# Patient Record
Sex: Female | Born: 1935 | Race: Black or African American | Hispanic: No | Smoking: Never smoker
Health system: Southern US, Community
[De-identification: ages and names within clinical notes are randomized; demographics above are authoritative.]

## PROBLEM LIST (undated history)

## (undated) DIAGNOSIS — K219 Gastro-esophageal reflux disease without esophagitis: Secondary | ICD-10-CM

## (undated) DIAGNOSIS — IMO0002 Reserved for concepts with insufficient information to code with codable children: Secondary | ICD-10-CM

## (undated) DIAGNOSIS — I350 Nonrheumatic aortic (valve) stenosis: Secondary | ICD-10-CM

## (undated) DIAGNOSIS — L03039 Cellulitis of unspecified toe: Secondary | ICD-10-CM

## (undated) DIAGNOSIS — G629 Polyneuropathy, unspecified: Secondary | ICD-10-CM

## (undated) DIAGNOSIS — I480 Paroxysmal atrial fibrillation: Secondary | ICD-10-CM

## (undated) DIAGNOSIS — I471 Supraventricular tachycardia: Secondary | ICD-10-CM

## (undated) DIAGNOSIS — M1610 Unilateral primary osteoarthritis, unspecified hip: Secondary | ICD-10-CM

## (undated) DIAGNOSIS — I119 Hypertensive heart disease without heart failure: Secondary | ICD-10-CM

## (undated) DIAGNOSIS — I4719 Other supraventricular tachycardia: Secondary | ICD-10-CM

## (undated) DIAGNOSIS — D649 Anemia, unspecified: Secondary | ICD-10-CM

## (undated) DIAGNOSIS — N393 Stress incontinence (female) (male): Secondary | ICD-10-CM

## (undated) DIAGNOSIS — I5032 Chronic diastolic (congestive) heart failure: Secondary | ICD-10-CM

## (undated) DIAGNOSIS — Z96659 Presence of unspecified artificial knee joint: Secondary | ICD-10-CM

## (undated) DIAGNOSIS — M76899 Other specified enthesopathies of unspecified lower limb, excluding foot: Secondary | ICD-10-CM

## (undated) DIAGNOSIS — E119 Type 2 diabetes mellitus without complications: Secondary | ICD-10-CM

## (undated) DIAGNOSIS — M169 Osteoarthritis of hip, unspecified: Secondary | ICD-10-CM

## (undated) DIAGNOSIS — I2510 Atherosclerotic heart disease of native coronary artery without angina pectoris: Secondary | ICD-10-CM

## (undated) DIAGNOSIS — I472 Ventricular tachycardia: Secondary | ICD-10-CM

## (undated) DIAGNOSIS — M4800 Spinal stenosis, site unspecified: Secondary | ICD-10-CM

## (undated) DIAGNOSIS — E785 Hyperlipidemia, unspecified: Secondary | ICD-10-CM

## (undated) DIAGNOSIS — I442 Atrioventricular block, complete: Secondary | ICD-10-CM

## (undated) DIAGNOSIS — R000 Tachycardia, unspecified: Secondary | ICD-10-CM

## (undated) DIAGNOSIS — J45909 Unspecified asthma, uncomplicated: Secondary | ICD-10-CM

## (undated) DIAGNOSIS — Z950 Presence of cardiac pacemaker: Secondary | ICD-10-CM

## (undated) DIAGNOSIS — M797 Fibromyalgia: Secondary | ICD-10-CM

## (undated) HISTORY — DX: Presence of unspecified artificial knee joint: Z96.659

## (undated) HISTORY — DX: Nonrheumatic aortic (valve) stenosis: I35.0

## (undated) HISTORY — DX: Atherosclerotic heart disease of native coronary artery without angina pectoris: I25.10

## (undated) HISTORY — DX: Osteoarthritis of hip, unspecified: M16.9

## (undated) HISTORY — DX: Hypertensive heart disease without heart failure: I11.9

## (undated) HISTORY — DX: Ventricular tachycardia: I47.2

## (undated) HISTORY — DX: Anemia, unspecified: D64.9

## (undated) HISTORY — DX: Hyperlipidemia, unspecified: E78.5

## (undated) HISTORY — DX: Reserved for concepts with insufficient information to code with codable children: IMO0002

## (undated) HISTORY — DX: Cellulitis of unspecified toe: L03.039

## (undated) HISTORY — DX: Atrioventricular block, complete: I44.2

## (undated) HISTORY — DX: Fibromyalgia: M79.7

## (undated) HISTORY — DX: Unspecified asthma, uncomplicated: J45.909

## (undated) HISTORY — DX: Polyneuropathy, unspecified: G62.9

## (undated) HISTORY — PX: CARDIAC CATHETERIZATION: SHX172

## (undated) HISTORY — DX: Stress incontinence (female) (male): N39.3

## (undated) HISTORY — DX: Type 2 diabetes mellitus without complications: E11.9

## (undated) HISTORY — DX: Gastro-esophageal reflux disease without esophagitis: K21.9

## (undated) HISTORY — DX: Paroxysmal atrial fibrillation: I48.0

## (undated) HISTORY — DX: Morbid (severe) obesity due to excess calories: E66.01

## (undated) HISTORY — PX: EYE SURGERY: SHX253

## (undated) HISTORY — DX: Presence of cardiac pacemaker: Z95.0

## (undated) HISTORY — DX: Chronic diastolic (congestive) heart failure: I50.32

## (undated) HISTORY — DX: Other specified enthesopathies of unspecified lower limb, excluding foot: M76.899

## (undated) HISTORY — DX: Tachycardia, unspecified: R00.0

## (undated) HISTORY — DX: Unilateral primary osteoarthritis, unspecified hip: M16.10

---

## 2004-10-26 ENCOUNTER — Ambulatory Visit: Payer: Self-pay | Admitting: Internal Medicine

## 2004-12-29 ENCOUNTER — Ambulatory Visit: Payer: Self-pay | Admitting: Gastroenterology

## 2005-04-10 ENCOUNTER — Ambulatory Visit: Payer: Self-pay | Admitting: Cardiology

## 2005-06-05 ENCOUNTER — Other Ambulatory Visit: Payer: Self-pay

## 2005-06-15 ENCOUNTER — Inpatient Hospital Stay: Payer: Self-pay | Admitting: General Practice

## 2005-06-17 ENCOUNTER — Other Ambulatory Visit: Payer: Self-pay

## 2005-06-21 ENCOUNTER — Encounter: Payer: Self-pay | Admitting: Internal Medicine

## 2005-10-27 ENCOUNTER — Ambulatory Visit: Payer: Self-pay | Admitting: Internal Medicine

## 2006-06-05 ENCOUNTER — Ambulatory Visit: Payer: Self-pay | Admitting: Cardiology

## 2006-07-18 ENCOUNTER — Ambulatory Visit: Payer: Self-pay | Admitting: Gastroenterology

## 2006-10-30 ENCOUNTER — Inpatient Hospital Stay: Payer: Self-pay | Admitting: General Practice

## 2006-11-02 ENCOUNTER — Encounter: Payer: Self-pay | Admitting: Internal Medicine

## 2006-11-03 HISTORY — PX: REPLACEMENT TOTAL KNEE: SUR1224

## 2006-11-05 ENCOUNTER — Emergency Department: Payer: Self-pay

## 2006-11-05 ENCOUNTER — Other Ambulatory Visit: Payer: Self-pay

## 2006-11-05 ENCOUNTER — Encounter: Payer: Self-pay | Admitting: Internal Medicine

## 2006-11-06 ENCOUNTER — Emergency Department: Payer: Self-pay | Admitting: Emergency Medicine

## 2006-11-06 ENCOUNTER — Other Ambulatory Visit: Payer: Self-pay

## 2007-02-20 ENCOUNTER — Ambulatory Visit: Payer: Self-pay | Admitting: Internal Medicine

## 2007-07-05 ENCOUNTER — Emergency Department: Payer: Self-pay | Admitting: Emergency Medicine

## 2007-07-09 ENCOUNTER — Ambulatory Visit: Payer: Self-pay | Admitting: Gastroenterology

## 2007-09-12 ENCOUNTER — Ambulatory Visit: Payer: Self-pay | Admitting: Ophthalmology

## 2007-09-24 ENCOUNTER — Ambulatory Visit: Payer: Self-pay | Admitting: Ophthalmology

## 2007-11-04 ENCOUNTER — Ambulatory Visit: Payer: Self-pay | Admitting: Ophthalmology

## 2007-11-19 ENCOUNTER — Ambulatory Visit: Payer: Self-pay | Admitting: Ophthalmology

## 2007-12-05 HISTORY — PX: NASAL SINUS SURGERY: SHX719

## 2008-03-03 ENCOUNTER — Ambulatory Visit: Payer: Self-pay | Admitting: Cardiology

## 2008-03-10 ENCOUNTER — Ambulatory Visit: Payer: Self-pay | Admitting: Internal Medicine

## 2008-05-01 ENCOUNTER — Emergency Department: Payer: Self-pay | Admitting: Emergency Medicine

## 2008-05-04 ENCOUNTER — Ambulatory Visit: Payer: Self-pay

## 2008-08-12 ENCOUNTER — Emergency Department: Payer: Self-pay | Admitting: Emergency Medicine

## 2008-09-08 ENCOUNTER — Ambulatory Visit: Payer: Self-pay | Admitting: Otolaryngology

## 2008-09-09 ENCOUNTER — Ambulatory Visit: Payer: Self-pay | Admitting: Unknown Physician Specialty

## 2008-11-09 ENCOUNTER — Ambulatory Visit: Payer: Self-pay | Admitting: Otolaryngology

## 2008-11-11 ENCOUNTER — Ambulatory Visit: Payer: Self-pay | Admitting: Otolaryngology

## 2009-01-30 ENCOUNTER — Emergency Department: Payer: Self-pay | Admitting: Emergency Medicine

## 2009-03-15 ENCOUNTER — Ambulatory Visit: Payer: Self-pay | Admitting: Internal Medicine

## 2009-08-04 HISTORY — PX: CARDIAC CATHETERIZATION: SHX172

## 2009-08-10 ENCOUNTER — Ambulatory Visit: Payer: Self-pay | Admitting: Cardiology

## 2009-08-17 ENCOUNTER — Ambulatory Visit: Payer: Self-pay | Admitting: Unknown Physician Specialty

## 2010-03-21 ENCOUNTER — Ambulatory Visit: Payer: Medicare Other | Admitting: Internal Medicine

## 2010-06-03 ENCOUNTER — Ambulatory Visit: Payer: Medicare Other

## 2010-08-17 ENCOUNTER — Ambulatory Visit: Payer: Medicare Other | Admitting: General Practice

## 2010-08-25 ENCOUNTER — Ambulatory Visit: Payer: Medicare Other | Admitting: General Practice

## 2010-08-30 ENCOUNTER — Encounter: Admission: RE | Admit: 2010-08-30 | Discharge: 2010-08-30 | Payer: Self-pay | Admitting: Orthopedic Surgery

## 2010-08-30 ENCOUNTER — Ambulatory Visit: Payer: Self-pay | Admitting: Cardiovascular Disease

## 2010-09-03 HISTORY — PX: TOTAL HIP ARTHROPLASTY: SHX124

## 2010-09-05 ENCOUNTER — Ambulatory Visit: Payer: Self-pay | Admitting: Family Medicine

## 2010-09-12 ENCOUNTER — Inpatient Hospital Stay: Payer: Medicare Other | Admitting: General Practice

## 2010-09-17 ENCOUNTER — Encounter: Payer: Medicare Other | Admitting: Internal Medicine

## 2010-10-04 ENCOUNTER — Encounter: Payer: Medicare Other | Admitting: Internal Medicine

## 2010-11-01 ENCOUNTER — Ambulatory Visit: Payer: Medicare Other

## 2010-12-08 ENCOUNTER — Encounter
Admission: RE | Admit: 2010-12-08 | Discharge: 2010-12-08 | Payer: Self-pay | Source: Home / Self Care | Attending: Orthopedic Surgery | Admitting: Orthopedic Surgery

## 2010-12-22 ENCOUNTER — Encounter
Admission: RE | Admit: 2010-12-22 | Discharge: 2010-12-22 | Payer: Self-pay | Source: Home / Self Care | Attending: Orthopedic Surgery | Admitting: Orthopedic Surgery

## 2011-01-03 NOTE — Assessment & Plan Note (Signed)
Summary: FLU SHOT/EVM   Assessment New Problems: NEED PROPHYLACTIC VACCINATION&INOCULATION FLU (ICD-V04.81)   The patient and/or caregiver has been counseled thoroughly with regard to medications prescribed including dosage, schedule, interactions, rationale for use, and possible side effects and they verbalize understanding.  Diagnoses and expected course of recovery discussed and will return if not improved as expected or if the condition worsens. Patient and/or caregiver verbalized understanding.   Medication Administration  Injection # 1:    Medication: Inflenza    Diagnosis: V01.84    Route: IM    Site: L deltoid    Exp Date: 05/04/2011    Lot #: [id number]    Mfr: [email or web address]    Comments: Assesed pt. for allergies and previous vaccine history. Pt. denies any food or drug allergies. Pt. states she has received the influenza vaccine before without any adverse reactions.  VIS given to pt.  Administered vaccine without any complicatons, pt. tolerated it well.    Patient tolerated injection without complications    Given by: Ashok Norris LPN (September 05, 2010 4:37 PM)  Orders Added: 1)  INFLUENZA VIRUS VACCINE SPLIT VIRUS 3 YEARS + I [[id number]]

## 2011-03-22 ENCOUNTER — Ambulatory Visit (INDEPENDENT_AMBULATORY_CARE_PROVIDER_SITE_OTHER): Payer: Medicare Other | Admitting: Cardiovascular Disease

## 2011-03-22 ENCOUNTER — Encounter: Payer: Self-pay | Admitting: Cardiovascular Disease

## 2011-03-22 DIAGNOSIS — I359 Nonrheumatic aortic valve disorder, unspecified: Secondary | ICD-10-CM

## 2011-03-22 DIAGNOSIS — R5383 Other fatigue: Secondary | ICD-10-CM

## 2011-03-22 DIAGNOSIS — I350 Nonrheumatic aortic (valve) stenosis: Secondary | ICD-10-CM

## 2011-03-22 DIAGNOSIS — R5381 Other malaise: Secondary | ICD-10-CM

## 2011-03-22 DIAGNOSIS — E785 Hyperlipidemia, unspecified: Secondary | ICD-10-CM

## 2011-03-22 DIAGNOSIS — I2510 Atherosclerotic heart disease of native coronary artery without angina pectoris: Secondary | ICD-10-CM

## 2011-03-22 DIAGNOSIS — M4800 Spinal stenosis, site unspecified: Secondary | ICD-10-CM

## 2011-03-22 NOTE — Assessment & Plan Note (Signed)
Symptoms of fatigue are somewhat vague. I'm uncertain if she has underlying sleep disorder, if her fatigue is from being generally deconditioned. We have suggested she continue physical therapy and in fact would like her to increase this to 3 times a week if possible with some kind of exercise on the other days of the week.

## 2011-03-22 NOTE — Assessment & Plan Note (Signed)
Most recent labs show total cholesterol 147, LDL 90, HDL 36. Labs were from November 2012. Ideally we should him for LDL less than 70. We are asked her to watch her diet, try to lose weight if possible.

## 2011-03-22 NOTE — Patient Instructions (Signed)
Please continue physical therapy three times a week. Please call if you get worsening shortness of breath or chest pain. This could be coming from your aortic valve. We will call you for a follow up appt in 6 months. Please call earlier for a APPT if you have worsening symptoms. Please monitor your blood pressure. Call if your blood pressure is too low.

## 2011-03-22 NOTE — Assessment & Plan Note (Addendum)
She does have at least moderate aortic valve disease. Last echocardiogram from outside of this office suggested an aortic valve area of 0.9 cm sq, Mean gradient of 43 mm of mercury.   She denies any significant shortness of breath or chest pain. Her main complaint is fatigue though this is somewhat nonspecific and it is uncertain if this is from her underlying cardiac issues. Her fatigue also seems to be there mildly at rest and with minimal exertion though she is able to use the bike and perform at rehabilitation without significant problems.  I suggested to her that we monitor her for now. The first would not be a cardiac catheterization but rather a TEE to evaluate her aortic valve. It is probably unlikely that her mild to moderate coronary artery disease has progressed to something severe in little over a year with such excellent cholesterol control.   Certainly if her symptoms get worse With worsening shortness of breath, chest tightness, worsening fatigue, we could have her complete a TEE and cardiac catheterization.

## 2011-03-22 NOTE — Assessment & Plan Note (Signed)
She reports that all surgery has been delayed on her back because of her heart condition. Her aortic valve is significant though can be certainly managed medically he went through a back operation. If her back surgery is indicated, we'll be happy to play a role in monitoring her through this surgical procedure.

## 2011-03-22 NOTE — Progress Notes (Signed)
   Patient ID: Michele Schultz, female    DOB: 12-23-1930, 77 y.o.   MRN: [id number]  HPI Comments: Michele Schultz Is a very pleasant 77 year old woman, patient of Dr. Daniel Nones, with a history of hypertension, diabetes, hyperlipidemia, coronary artery disease with 50% mid left main and 50% ostial circumflex disease also with at least moderate aortic valve stenosis, chronic fatigue who presents to establish care.  She reports that her main complaint is fatigue. She completes physical therapy at [facility] 2 times per week and strongly believes that this is helping her. She reports having spinal stenosis which causes some lower extremity discomfort and weakness. She is uncertain why she has fatigue. Her fatigue has been worse over the past 6 months or so. She sleeps well typically does wake up several times for her back pain as she cannot get comfortable. She denies any significant shortness of breath with exertion while walking or with physical therapy. She denies any significant chest pain with exertion.  She is concerned that her blood pressure could be too low and that this could be contributing to her fatigue.  She was upset with her previous cardiologist but the details are uncertain. He had recommended a cardiac catheterization to determine if she needed bypass surgery or valve surgery or both.   EKG shows normal sinus rhythm with rate 76 beats per minute with T-wave abnormality in leads V3 through V6, inferior leads     Review of Systems  Constitutional: Positive for fatigue.  HENT: Negative.   Eyes: Negative.   Respiratory: Negative.   Cardiovascular: Negative.   Gastrointestinal: Negative.   Musculoskeletal: Positive for back pain, arthralgias and gait problem.  Skin: Negative.   Neurological: Negative.   Hematological: Negative.   Psychiatric/Behavioral: Negative.   All other systems reviewed and are negative.   BP 108/60  Pulse 74  Ht 5\' 4"  (1.626 m)  Wt 227 lb (102.967 kg)  BMI  38.96 kg/m2    Physical Exam  Nursing note and vitals reviewed. Constitutional: She is oriented to person, place, and time. She appears well-developed and well-nourished.       Obese  HENT:  Head: Normocephalic.  Nose: Nose normal.  Mouth/Throat: Oropharynx is clear and moist.  Eyes: Conjunctivae are normal. Pupils are equal, round, and reactive to light.  Neck: Normal range of motion. Neck supple. No JVD present.  Cardiovascular: Normal rate, regular rhythm, S1 normal, S2 normal and intact distal pulses.  Exam reveals no gallop and no friction rub.   Murmur heard.  Crescendo systolic murmur is present with a grade of 3/6  Pulmonary/Chest: Effort normal and breath sounds normal. No respiratory distress. She has no wheezes. She has no rales. She exhibits no tenderness.  Abdominal: Soft. Bowel sounds are normal. She exhibits no distension. There is no tenderness.  Musculoskeletal: Normal range of motion. She exhibits no edema and no tenderness.  Lymphadenopathy:    She has no cervical adenopathy.  Neurological: She is alert and oriented to person, place, and time. Coordination normal.  Skin: Skin is warm and dry. No rash noted. No erythema.  Psychiatric: She has a normal mood and affect. Her behavior is normal. Judgment and thought content normal.         Assessment and Plan

## 2011-03-22 NOTE — Assessment & Plan Note (Signed)
Mild-to-moderate left main and ostial circumflex disease in late 2010. We will just watch her for now as her symptoms of fatigue are somewhat nonspecific, and appeared to be relatively stable. I suggested she contact me if her symptoms get worse

## 2011-04-03 ENCOUNTER — Encounter: Payer: Self-pay | Admitting: Cardiovascular Disease

## 2011-04-04 ENCOUNTER — Encounter: Payer: Self-pay | Admitting: Cardiovascular Disease

## 2011-04-04 ENCOUNTER — Ambulatory Visit: Payer: Medicare Other | Admitting: Internal Medicine

## 2011-08-23 ENCOUNTER — Ambulatory Visit: Payer: Medicare Other | Admitting: Unknown Physician Specialty

## 2011-08-29 ENCOUNTER — Encounter: Payer: Self-pay | Admitting: Cardiovascular Disease

## 2011-08-29 ENCOUNTER — Ambulatory Visit (INDEPENDENT_AMBULATORY_CARE_PROVIDER_SITE_OTHER): Payer: Medicare Other | Admitting: Cardiovascular Disease

## 2011-08-29 DIAGNOSIS — I359 Nonrheumatic aortic valve disorder, unspecified: Secondary | ICD-10-CM

## 2011-08-29 DIAGNOSIS — E785 Hyperlipidemia, unspecified: Secondary | ICD-10-CM

## 2011-08-29 DIAGNOSIS — I2510 Atherosclerotic heart disease of native coronary artery without angina pectoris: Secondary | ICD-10-CM

## 2011-08-29 DIAGNOSIS — M4800 Spinal stenosis, site unspecified: Secondary | ICD-10-CM

## 2011-08-29 DIAGNOSIS — I350 Nonrheumatic aortic (valve) stenosis: Secondary | ICD-10-CM

## 2011-08-29 NOTE — Assessment & Plan Note (Signed)
Currently with no symptoms of angina. No further workup at this time. Continue current medication regimen. She does have occasional indigestion which is unchanged from previous visits.

## 2011-08-29 NOTE — Assessment & Plan Note (Signed)
I believe it has been sometime since her aortic valve was evaluated. Murmur is quite prominent. We have ordered an echocardiogram to evaluate aortic valve stenosis, previously was felt to be moderate in severity.

## 2011-08-29 NOTE — Assessment & Plan Note (Signed)
She does have chronic pain issues in her back, joints. No surgeries planned at this time

## 2011-08-29 NOTE — Progress Notes (Signed)
Patient ID: Michele Schultz, female    DOB: 1931-02-24, 77 y.o.   MRN: [id number]  HPI Comments: Michele Schultz Is a very pleasant 77 year old woman, patient of Dr. Daniel Nones, He was a former Engineer, civil (consulting), who lost her house and [place], with a history of hypertension, diabetes, hyperlipidemia, coronary artery disease with 50% mid left main and 50% ostial circumflex disease also with at least moderate aortic valve stenosis several years ago, chronic fatigue who presents for routine followup.  She reports that overall she is doing well. She does have chronic back pain, chronic knee pain and hip pain. She has had a knee and hip replacement. She does have occasional gas pains with discomfort in her chest. Sometimes she takes a soda to improve her symptoms. She believes that she has a hiatal hernia. Symptoms are brief, occur one to 2 times per week lasting for 2-3 minutes at a time. This is unchanged in frequency and intensity and not associated with exertion. She has had problems with fatigueShe is concerned that her blood pressure could be too low and that this could be contributing to her fatigue.  EKG shows normal sinus rhythm with rate 71 beats per minute with T-wave abnormality in leads V3 through V6, inferior leads   Outpatient Encounter Prescriptions as of 08/29/2011  Medication Sig Dispense Refill  . amLODipine (NORVASC) 5 MG tablet Take 5 mg by mouth daily.        Marland Kitchen aspirin 81 MG tablet Take 81 mg by mouth daily.        Marland Kitchen atorvastatin (LIPITOR) 40 MG tablet Take 40 mg by mouth daily.        . Calcium Carbonate-Vitamin D (CALCIUM 600+D) 600-200 MG-UNIT TABS Take 1 tablet by mouth 2 (two) times daily.        . Cholecalciferol (VITAMIN D) 2000 UNITS CAPS Take 1 capsule by mouth daily.        . Cyanocobalamin (VITAMIN B-12) 1000 MCG/15ML LIQD Inject 1,000 mcg as directed every 30 (thirty) days.        . cycloSPORINE (RESTASIS) 0.05 % ophthalmic emulsion Place 1 drop into both eyes 2 (two)  times daily.        . Ferrous Sulfate Dried (SLOW RELEASE IRON) 140 (45 FE) MG TBCR Take 1 tablet by mouth daily.        . fish oil-omega-3 fatty acids 1000 MG capsule Take 2 g by mouth 2 (two) times daily.        Marland Kitchen gabapentin (NEURONTIN) 100 MG tablet Take 100 mg by mouth daily.        Marland Kitchen HYDROcodone-acetaminophen (NORCO) 10-325 MG per tablet Take 1 tablet by mouth every 6 (six) hours as needed.        Marland Kitchen ibuprofen (ADVIL,MOTRIN) 800 MG tablet Take 800 mg by mouth every 8 (eight) hours as needed.        . metoprolol (TOPROL-XL) 50 MG 24 hr tablet Take 50 mg by mouth daily.        . Multiple Vitamin (MULTIVITAMIN) tablet Take 1 tablet by mouth daily.        Marland Kitchen oxybutynin (DITROPAN-XL) 10 MG 24 hr tablet Take 10 mg by mouth daily.        . pantoprazole (PROTONIX) 40 MG tablet Take 40 mg by mouth 2 (two) times daily.        . valsartan (DIOVAN) 160 MG tablet Take 160 mg by mouth daily.  Review of Systems  Constitutional: Positive for fatigue.  HENT: Negative.   Eyes: Negative.   Respiratory: Negative.   Cardiovascular: Negative.   Gastrointestinal: Negative.   Musculoskeletal: Positive for back pain, arthralgias and gait problem.  Skin: Negative.   Neurological: Negative.   Hematological: Negative.   Psychiatric/Behavioral: Negative.   All other systems reviewed and are negative.   BP 135/85  Pulse 76  Ht 5\' 4"  (1.626 m)  Wt 232 lb 12 oz (105.575 kg)  BMI 39.95 kg/m2  Physical Exam  Nursing note and vitals reviewed. Constitutional: She is oriented to person, place, and time. She appears well-developed and well-nourished.       Obese  HENT:  Head: Normocephalic.  Nose: Nose normal.  Mouth/Throat: Oropharynx is clear and moist.  Eyes: Conjunctivae are normal. Pupils are equal, round, and reactive to light.  Neck: Normal range of motion. Neck supple. No JVD present.  Cardiovascular: Normal rate, regular rhythm, S1 normal, S2 normal and intact distal pulses.  Exam reveals  no gallop and no friction rub.   Murmur heard.  Crescendo systolic murmur is present with a grade of 3/6  Pulmonary/Chest: Effort normal and breath sounds normal. No respiratory distress. She has no wheezes. She has no rales. She exhibits no tenderness.  Abdominal: Soft. Bowel sounds are normal. She exhibits no distension. There is no tenderness.  Musculoskeletal: Normal range of motion. She exhibits no edema and no tenderness.  Lymphadenopathy:    She has no cervical adenopathy.  Neurological: She is alert and oriented to person, place, and time. Coordination normal.  Skin: Skin is warm and dry. No rash noted. No erythema.  Psychiatric: She has a normal mood and affect. Her behavior is normal. Judgment and thought content normal.         Assessment and Plan

## 2011-08-29 NOTE — Assessment & Plan Note (Signed)
Given her underlying coronary artery disease, goal LDL is less than 70, total cholesterol less than 150. Her recent cholesterol shows total cholesterol 132, LDL 74. This is relatively adequate.

## 2011-08-29 NOTE — Patient Instructions (Addendum)
You are doing well. No medication changes were made. Please call us if you have new issues that need to be addressed before your next appt.  We will call you for a follow up Appt. In 6 months  Your physician has requested that you have an echocardiogram. Echocardiography is a painless test that uses sound waves to create images of your heart. It provides your doctor with information about the size and shape of your heart and how well your heart's chambers and valves are working. This procedure takes approximately one hour. There are no restrictions for this procedure.

## 2011-09-05 ENCOUNTER — Other Ambulatory Visit (INDEPENDENT_AMBULATORY_CARE_PROVIDER_SITE_OTHER): Payer: Medicare Other | Admitting: *Deleted

## 2011-09-05 DIAGNOSIS — I350 Nonrheumatic aortic (valve) stenosis: Secondary | ICD-10-CM

## 2011-09-05 DIAGNOSIS — I359 Nonrheumatic aortic valve disorder, unspecified: Secondary | ICD-10-CM

## 2011-09-15 ENCOUNTER — Ambulatory Visit (INDEPENDENT_AMBULATORY_CARE_PROVIDER_SITE_OTHER): Payer: Medicare Other | Admitting: Cardiovascular Disease

## 2011-09-15 ENCOUNTER — Encounter: Payer: Self-pay | Admitting: Cardiovascular Disease

## 2011-09-15 VITALS — BP 173/92 | HR 72 | Ht 64.0 in | Wt 232.0 lb

## 2011-09-15 DIAGNOSIS — I2510 Atherosclerotic heart disease of native coronary artery without angina pectoris: Secondary | ICD-10-CM

## 2011-09-15 DIAGNOSIS — I10 Essential (primary) hypertension: Secondary | ICD-10-CM

## 2011-09-15 DIAGNOSIS — I359 Nonrheumatic aortic valve disorder, unspecified: Secondary | ICD-10-CM

## 2011-09-15 DIAGNOSIS — I110 Hypertensive heart disease with heart failure: Secondary | ICD-10-CM | POA: Insufficient documentation

## 2011-09-15 DIAGNOSIS — I350 Nonrheumatic aortic (valve) stenosis: Secondary | ICD-10-CM

## 2011-09-15 DIAGNOSIS — M4800 Spinal stenosis, site unspecified: Secondary | ICD-10-CM

## 2011-09-15 DIAGNOSIS — E785 Hyperlipidemia, unspecified: Secondary | ICD-10-CM

## 2011-09-15 MED ORDER — AMLODIPINE BESYLATE 5 MG PO TABS: 5.0000 mg | ORAL_TABLET | Freq: Every day | ORAL | Status: DC

## 2011-09-15 NOTE — Assessment & Plan Note (Signed)
Blood pressure is elevated. She has had a headache for several days. We have suggested she increase her amlodipine back to 5 mg daily and closely monitor her pressures at home.

## 2011-09-15 NOTE — Assessment & Plan Note (Signed)
We have suggested she stay on her cholesterol pill. Goal LDL less than 70.

## 2011-09-15 NOTE — Progress Notes (Signed)
Patient ID: Michele Schultz, female    DOB: September 15, 1931, 77 y.o.   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   She has chronic back pain, chronic knee pain and hip pain. She has had a knee and hip replacement. She continues to have discomfort in her chest. Sometimes she takes a soda to improve her symptoms. . Symptoms are brief, occur one to 2 times per week lasting for 2-3 minutes at a time. The frequency and intensity has been increasing and now associated with  exertion. She has had problems with fatigue.   Echo shows: - Left ventricle: The cavity size was normal. There was mild concentric hypertrophy. Systolic function was normal. The   estimated ejection fraction was in the range of 55% to 60%. Wall motion was normal; there were no regional wall   motion abnormalities. Doppler parameters are consistent with abnormal left ventricular relaxation (grade 1 diastolic dysfunction). - Aortic valve: Moderately to severely calcified annulus. Trileaflet; severely calcified leaflets. Cusp separation was severely reduced. Transvalvular velocity was increased. There was severe stenosis. Mild regurgitation. Mean gradient: 57mm Hg (S). Peak gradient: 78mm Hg (S). Valve area: 0.72cm^2 (Vmax). - Mitral valve: Mild regurgitation.    - Right ventricle: Systolic function was normal.  EKG shows normal sinus rhythm with rate 72 beats per minute with T-wave abnormality in leads V3 through V6, and inferior leads   Outpatient Encounter Prescriptions as of 09/15/2011  Medication Sig Dispense Refill  . amLODipine (NORVASC) 5 MG tablet Take 1/2  tablet (5 mg total) by mouth daily.  30 tablet  6  . aspirin 81 MG tablet Take 81 mg by mouth daily.        Marland Kitchen atorvastatin (LIPITOR) 40 MG tablet Take 40 mg by mouth daily.        . Calcium Carbonate-Vitamin D (CALCIUM 600+D) 600-200 MG-UNIT TABS Take 1 tablet by mouth 2 (two) times daily.        . Cholecalciferol (VITAMIN D) 2000 UNITS CAPS Take 1 capsule by mouth daily.        . Cyanocobalamin (VITAMIN B-12) 1000 MCG/15ML LIQD Inject 1,000 mcg as directed every 30 (thirty) days.        . cycloSPORINE (RESTASIS) 0.05 % ophthalmic emulsion Place 1 drop into both eyes 2 (two) times daily.        . Ferrous Sulfate Dried (SLOW RELEASE IRON) 140 (45 FE) MG TBCR Take 1 tablet by mouth daily.        . fish oil-omega-3 fatty acids 1000 MG capsule Take 2 g by mouth 2 (two) times daily.        Marland Kitchen HYDROcodone-acetaminophen (NORCO) 10-325 MG per tablet Take 1 tablet by mouth every 6 (six) hours as needed.        Marland Kitchen ibuprofen (ADVIL,MOTRIN) 800 MG tablet Take 800 mg by mouth every 8 (eight) hours as needed.        . metoprolol (TOPROL-XL) 50 MG 24 hr tablet Take 50 mg by mouth daily.        . Multiple Vitamin (MULTIVITAMIN) tablet Take 1 tablet by mouth daily.        Marland Kitchen oxybutynin (DITROPAN-XL)  10 MG 24 hr tablet Take 10 mg by mouth daily.        . pantoprazole (PROTONIX) 40 MG tablet Take 40 mg by mouth 2 (two) times daily.        . valsartan (DIOVAN) 160 MG tablet Take 160 mg by mouth daily.          Review of Systems  Constitutional: Positive for fatigue.  HENT: Negative.   Eyes: Negative.   Respiratory: Positive for shortness of breath.   Cardiovascular: Positive for chest pain.  Gastrointestinal: Negative.   Musculoskeletal: Positive for back pain, arthralgias and gait problem.  Skin: Negative.   Neurological: Negative.   Hematological: Negative.   Psychiatric/Behavioral: Negative.   All other systems reviewed and are negative.   BP 173/92  Pulse 72  Ht 5\' 4"  (1.626 m)  Wt 232 lb (105.235  kg)  BMI 39.82 kg/m2  Physical Exam  Nursing note and vitals reviewed. Constitutional: She is oriented to person, place, and time. She appears well-developed and well-nourished.       Obese  HENT:  Head: Normocephalic.  Nose: Nose normal.  Mouth/Throat: Oropharynx is clear and moist.  Eyes: Conjunctivae are normal. Pupils are equal, round, and reactive to light.  Neck: Normal range of motion. Neck supple. No JVD present.  Cardiovascular: Normal rate, regular rhythm, S1 normal, S2 normal and intact distal pulses.  Exam reveals no gallop and no friction rub.   Murmur heard.  Crescendo systolic murmur is present with a grade of 3/6  Pulmonary/Chest: Effort normal and breath sounds normal. No respiratory distress. She has no wheezes. She has no rales. She exhibits no tenderness.  Abdominal: Soft. Bowel sounds are normal. She exhibits no distension. There is no tenderness.  Musculoskeletal: Normal range of motion. She exhibits no edema and no tenderness.  Lymphadenopathy:    She has no cervical adenopathy.  Neurological: She is alert and oriented to person, place, and time. Coordination normal.  Skin: Skin is warm and dry. No rash noted. No erythema.  Psychiatric: She has a normal mood and affect. Her behavior is normal. Judgment and thought content normal.         Assessment and Plan

## 2011-09-15 NOTE — Assessment & Plan Note (Addendum)
Recent echocardiogram shows severe aortic valve stenosis with valve area 0.7 cm. She is having symptoms of chest pain and shortness of breath with exertion. We will schedule her for a transesophageal echo, also schedule her for a cardiac catheterization After the echocardiogram. She does have a history of left main disease. She'll likely need an aortic valve replacement in the near future. Uncertain if a new valve will completely resolve all of her symptoms of shortness of breath and she does have underlying obesity and is deconditioned.

## 2011-09-15 NOTE — Assessment & Plan Note (Signed)
She does have chronic pain from history of spinal stenosis. She takes Percocet p.r.n..

## 2011-09-15 NOTE — Patient Instructions (Addendum)
You are doing well. Please increase the amlodipine to 5 mg a day. Please monitor your blood pressure at home and call if the numbers are high Please call us if you have new issues that need to be addressed before your next appt.  We will call you for a follow up Appt. In 1 month.

## 2011-09-15 NOTE — Assessment & Plan Note (Signed)
Cardiac catheterization 2 years ago suggested 50% left main disease and ostial circumflex disease. As part of her workup for her aortic valve and possible surgery, we will schedule her for a cardiac catheterization.

## 2011-09-18 ENCOUNTER — Telehealth: Payer: Self-pay | Admitting: Cardiovascular Disease

## 2011-09-18 NOTE — Telephone Encounter (Signed)
Called pt back LMOM TCB. TEE has been scheduled for Friday 10/19 at [facility]. Need to give pt instructions.

## 2011-09-18 NOTE — Telephone Encounter (Signed)
PATIENT CALLING TO SET UP HER TEE AT [facility], SHE WOULD LIKE TO SPEAK WITH RN BEFORE 12:00PM TODAY AS LATER TODAY SHE WILL BE TIED UP WITH OTHER APPOINTMENTS.

## 2011-09-18 NOTE — Telephone Encounter (Signed)
Spoke to pt, notified of appt time, instructions given for procedure and she confirmed that she will have a driver post procedure. Pt advised to call back with any questions regarding procedure.

## 2011-09-22 ENCOUNTER — Ambulatory Visit: Payer: Medicare Other | Admitting: Cardiovascular Disease

## 2011-09-22 DIAGNOSIS — I359 Nonrheumatic aortic valve disorder, unspecified: Secondary | ICD-10-CM

## 2011-09-25 ENCOUNTER — Other Ambulatory Visit: Payer: Self-pay | Admitting: Cardiovascular Disease

## 2011-09-25 DIAGNOSIS — I350 Nonrheumatic aortic (valve) stenosis: Secondary | ICD-10-CM

## 2011-09-25 DIAGNOSIS — Z01810 Encounter for preprocedural cardiovascular examination: Secondary | ICD-10-CM

## 2011-09-27 ENCOUNTER — Encounter: Payer: Self-pay | Admitting: *Deleted

## 2011-09-29 ENCOUNTER — Ambulatory Visit (INDEPENDENT_AMBULATORY_CARE_PROVIDER_SITE_OTHER): Payer: Medicare Other | Admitting: *Deleted

## 2011-09-29 DIAGNOSIS — Z01810 Encounter for preprocedural cardiovascular examination: Secondary | ICD-10-CM

## 2011-09-29 DIAGNOSIS — I359 Nonrheumatic aortic valve disorder, unspecified: Secondary | ICD-10-CM

## 2011-09-29 DIAGNOSIS — I350 Nonrheumatic aortic (valve) stenosis: Secondary | ICD-10-CM

## 2011-09-30 LAB — CBC WITH DIFFERENTIAL
Basophils Absolute: 0 10*3/uL (ref 0.0–0.2)
Eosinophils Absolute: 0.2 10*3/uL (ref 0.0–0.4)
Immature Granulocytes: 0 % (ref 0–2)
Lymphocytes Absolute: 2.5 10*3/uL (ref 0.7–4.5)
Lymphs: 30 % (ref 14–46)
MCHC: 33.4 g/dL (ref 31.5–35.7)
Monocytes: 7 % (ref 4–13)
Platelets: 294 10*3/uL (ref 140–415)
RDW: 14.2 % (ref 12.3–15.4)
WBC: 8.3 10*3/uL (ref 4.0–10.5)

## 2011-09-30 LAB — BASIC METABOLIC PANEL
BUN/Creatinine Ratio: 26 (ref 11–26)
Calcium: 10 mg/dL (ref 8.6–10.2)
Creatinine, Ser: 0.68 mg/dL (ref 0.57–1.00)
GFR calc non Af Amer: 84 mL/min/{1.73_m2} (ref >59)
Potassium: 3.9 mmol/L (ref 3.5–5.2)
Sodium: 142 mmol/L (ref 134–144)

## 2011-10-03 ENCOUNTER — Encounter: Payer: Self-pay | Admitting: Cardiovascular Disease

## 2011-10-03 ENCOUNTER — Ambulatory Visit: Payer: Medicare Other | Admitting: Cardiovascular Disease

## 2011-10-03 DIAGNOSIS — I2510 Atherosclerotic heart disease of native coronary artery without angina pectoris: Secondary | ICD-10-CM

## 2011-10-04 ENCOUNTER — Other Ambulatory Visit: Payer: Self-pay | Admitting: Cardiovascular Disease

## 2011-10-04 DIAGNOSIS — I350 Nonrheumatic aortic (valve) stenosis: Secondary | ICD-10-CM

## 2011-10-04 DIAGNOSIS — Z01810 Encounter for preprocedural cardiovascular examination: Secondary | ICD-10-CM

## 2011-10-12 ENCOUNTER — Encounter: Payer: Self-pay | Admitting: Thoracic Surgery (Cardiothoracic Vascular Surgery)

## 2011-10-12 ENCOUNTER — Institutional Professional Consult (permissible substitution) (INDEPENDENT_AMBULATORY_CARE_PROVIDER_SITE_OTHER): Payer: Medicare Other | Admitting: Thoracic Surgery (Cardiothoracic Vascular Surgery)

## 2011-10-12 ENCOUNTER — Other Ambulatory Visit: Payer: Self-pay

## 2011-10-12 VITALS — BP 160/83 | HR 76 | Resp 20 | Ht 64.0 in | Wt 230.0 lb

## 2011-10-12 DIAGNOSIS — I359 Nonrheumatic aortic valve disorder, unspecified: Secondary | ICD-10-CM

## 2011-10-12 DIAGNOSIS — I2510 Atherosclerotic heart disease of native coronary artery without angina pectoris: Secondary | ICD-10-CM

## 2011-10-12 NOTE — Progress Notes (Signed)
PCP is Daniel Nones, [place] Referring Provider is Mariah Milling, Tollie Pizza, MD  Chief Complaint  Patient presents with  . 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 Julien Nordmann regarding severe aortic stenosis. She recently has been having chest pain which she describes as sharp pains often related to food and she thinks it could potentially be indigestion. She also harvest been having shortness of breath with exertion. She states with walking with a rapid pace or walking up an incline she becomes short of breath and feels extremely fatigued. She is noted that this has happened with progressively less exertion required to trigger the shortness of breath and fatigue. She denies orthopnea or paroxysmal atrial dyspnea. She denies syncope or presyncope.  Past Medical History  Diagnosis Date  . Hypertension   . Anemia   . Iron deficiency anemia, unspecified   . Type II or unspecified type diabetes mellitus without mention of complication, not stated as uncontrolled   . Neuralgia, neuritis, and radiculitis, unspecified   . Other and unspecified hyperlipidemia   . Aortic valve disorders   . Onychia and paronychia of toe   . Osteoarthrosis, unspecified whether generalized or localized, pelvic region and thigh     mainly in her back and knees  . Enthesopathy of hip region   . Esophageal reflux     followed by Dr.Seigal. stabilized with a combination of Nexium and Zantac  . Knee joint replacement by other means   . Stress incontinence, female     followed by Dr.Cope  . Moderate aortic stenosis 01/12/2010    calculated aortic valve area of 0.9cm with a mean gradient of . ECHO-11/11/2010  . Coronary atherosclerosis of native coronary artery   . Coronary atherosclerosis of autologous vein bypass graft     Past Surgical History  Procedure Date  . Cardiac catheterization  08/2009    50% stenosis distal left main, 50% stenosis ostial left circumflex.   . Replacement total knee 11/2006    right knee  . Nasal sinus surgery 2009  . Total hip arthroplasty 09/2010    Family History  Problem Relation Age of Onset  . Diabetes Other   . Heart disease Sister   . Heart disease Brother   . Heart disease Brother   . Heart disease Brother     Social History History  Substance Use Topics  . Smoking status: Never Smoker   . Smokeless tobacco: Never Used  . Alcohol Use: No    Current Outpatient Prescriptions  Medication Sig Dispense Refill  . amLODipine (NORVASC) 5 MG tablet Take 1 tablet (5 mg total) by mouth daily.  30 tablet  6  . aspirin 81 MG tablet Take 81 mg by mouth daily.        Marland Kitchen atorvastatin (LIPITOR) 40 MG tablet Take 40 mg by mouth daily.        . Calcium Carbonate-Vitamin D (CALCIUM 600+D) 600-200 MG-UNIT TABS Take 1 tablet by mouth 2 (two) times daily.        . Cholecalciferol (VITAMIN D) 2000 UNITS CAPS Take 1 capsule by mouth daily.        . Cyanocobalamin (VITAMIN B-12) 1000 MCG/15ML LIQD Inject 1,000 mcg as directed every 30 (thirty) days.        . cycloSPORINE (RESTASIS) 0.05 % ophthalmic emulsion Place 1 drop into both eyes 2 (two)  times daily.        . Ferrous Sulfate Dried (SLOW RELEASE IRON) 140 (45 FE) MG TBCR Take 1 tablet by mouth daily.        . fish oil-omega-3 fatty acids 1000 MG capsule Take 2 g by mouth 2 (two) times daily.        Marland Kitchen HYDROcodone-acetaminophen (NORCO) 10-325 MG per tablet Take 1 tablet by mouth every 6 (six) hours as needed.        Marland Kitchen ibuprofen (ADVIL,MOTRIN) 800 MG tablet Take 800 mg by mouth every 8 (eight) hours as needed.        . metoprolol (TOPROL-XL) 50 MG 24 hr tablet Take 50 mg by mouth daily.        . Multiple Vitamin (MULTIVITAMIN) tablet Take 1 tablet by mouth daily.        Marland Kitchen oxybutynin (DITROPAN-XL) 10 MG 24 hr tablet Take 10 mg by mouth daily.        . pantoprazole (PROTONIX) 40 MG tablet Take 40 mg by  mouth 2 (two) times daily.        . valsartan (DIOVAN) 160 MG tablet Take 160 mg by mouth daily.          Allergies  Allergen Reactions  . Citalopram     Altered mental status  . Cymbalta (Duloxetine Hcl)     Caused sedation  . Imipramine   . Proton Pump Inhibitors     Review of Systems: Positive for dry eyes after cataract surgery, weight gain, epigastric and chest pain, shortness of breath with exertion, productive cough, constipation, chronic pain due to arthritis in knees and hips, previous bilateral knee replacement, previous hip replacement, activities Limited due to extremity pain, worsening hearing.  All systems negative  BP 160/83  Pulse 76  Resp 20  Ht 5\' 4"  (1.626 m)  Wt 230 lb (104.327 kg)  BMI 39.48 kg/m2  SpO2 100% Physical Exam: Obese 77 year old woman in no acute distress Neurologic alert and oriented x3, no focal deficits HEENT is unremarkable , good dentition Neck supple without thyromegaly, adenopathy, positive bruit versus transmitted heart murmur Lungs clear bilaterally Cardiac regular rate and rhythm with a 3/6 crescendo decrescendo murmur at right upper sternal border Abdomen soft nontender Extremities no clubbing cyanosis or edema, 2+ radial pulses, 1+ posterior tibial pulses bilaterally  Diagnostic Tests: Echocardiogram reviewed showed normal left ventricular function with severe aortic stenosis with a valve area of 0.7 cm  Cardiac catheterization 50% eccentric left main stenosis  Impression: Michele Schultz is is a 77 year old woman with severe symptomatic aortic stenosis. She needs aortic valve replacement given her limited life expectancy with this severity of disease. She also has a 50% left main coronary stenosis which has been stable over time. In my opinion given that this is a left main stenosis at the would be best to bypass her LAD and circumflex at the time of her AVR. I don't think the lesion is tight enough to warrant mammary artery, but  we could place saphenous vein graft to the LAD and circumflex which would add minimally to operative time an operative risk and could potentially prevent a major MI in the future.  I discussed with the patient the disease process, the natural history, and  recommended treatments. Critical aortic stenosis with a valve area of 0.7 cm2 is a definite indication for surgical intervention. There is no medical therapy or other interventional procedure with equivalent results. I discussed in detail with her the nature of the  operation, need for general anesthesia, need for cardiopulmonary bypass, suspected hospital stay, overall recovery, and expected outcomes.  We discussed valve options including mechanical and tissue valves and the relative advantages and disadvantages of each. She understands the primary advantage of the tissue valve was to avoid the need for lifelong anticoagulation. At her age tissue valve is the preferred option, and she strongly does not want to be on Coumadin.  I discussed in detail with her the risks and benefits. She understands the risks include but are not limited to death, MI, stroke, DVT, PE, bleeding, possible need for transfusion, infection, and other organ system dysfunction (respiratory renal or GI complications), complete heart block requiring permanent pacemaker placement. She understands and accepts these risks and agrees to proceed.  Plan: She is having carotid duplex done tomorrow  Tentatively plan for aortic valve replacement and coronary bypass grafting x2 on Tuesday, November 13. She would be admitted on the day of surgery.

## 2011-10-13 ENCOUNTER — Ambulatory Visit (HOSPITAL_COMMUNITY)
Admission: RE | Admit: 2011-10-13 | Discharge: 2011-10-13 | Payer: Medicare Other | Source: Ambulatory Visit | Attending: Thoracic Surgery (Cardiothoracic Vascular Surgery) | Admitting: Thoracic Surgery (Cardiothoracic Vascular Surgery)

## 2011-10-13 ENCOUNTER — Encounter (INDEPENDENT_AMBULATORY_CARE_PROVIDER_SITE_OTHER): Payer: Medicare Other | Admitting: *Deleted

## 2011-10-13 ENCOUNTER — Inpatient Hospital Stay (HOSPITAL_COMMUNITY): Admission: RE | Admit: 2011-10-13 | Payer: Medicare Other | Source: Ambulatory Visit

## 2011-10-13 ENCOUNTER — Ambulatory Visit (HOSPITAL_COMMUNITY)
Admission: RE | Admit: 2011-10-13 | Discharge: 2011-10-13 | Disposition: A | Discharge status: Home / Self Care | Payer: Medicare Other | Source: Ambulatory Visit | Attending: Thoracic Surgery (Cardiothoracic Vascular Surgery) | Admitting: Thoracic Surgery (Cardiothoracic Vascular Surgery)

## 2011-10-13 DIAGNOSIS — I359 Nonrheumatic aortic valve disorder, unspecified: Secondary | ICD-10-CM

## 2011-10-13 DIAGNOSIS — I2510 Atherosclerotic heart disease of native coronary artery without angina pectoris: Secondary | ICD-10-CM

## 2011-10-13 DIAGNOSIS — R0989 Other specified symptoms and signs involving the circulatory and respiratory systems: Secondary | ICD-10-CM | POA: Insufficient documentation

## 2011-10-13 DIAGNOSIS — I350 Nonrheumatic aortic (valve) stenosis: Secondary | ICD-10-CM

## 2011-10-13 DIAGNOSIS — Z01810 Encounter for preprocedural cardiovascular examination: Secondary | ICD-10-CM

## 2011-10-13 DIAGNOSIS — R0609 Other forms of dyspnea: Secondary | ICD-10-CM | POA: Insufficient documentation

## 2011-10-13 DIAGNOSIS — Z01811 Encounter for preprocedural respiratory examination: Secondary | ICD-10-CM | POA: Insufficient documentation

## 2011-10-13 NOTE — Progress Notes (Signed)
*  PRELIMINARY RESULTS* Upper Extremity: Palmar Arch-right decreased 50% with radial compression. Normal with ulnar compression. Left-normal with radial and ulnar compression. Lower Extremity:right ABI 0.73 Left ABI 0.74. Marland Kitchen  Michele Schultz 10/13/2011, 8:53 PM

## 2011-10-16 ENCOUNTER — Encounter (HOSPITAL_COMMUNITY)
Admission: RE | Admit: 2011-10-16 | Discharge: 2011-10-16 | Disposition: A | Discharge status: Home / Self Care | Payer: Medicare Other | Source: Ambulatory Visit | Attending: Thoracic Surgery (Cardiothoracic Vascular Surgery) | Admitting: Thoracic Surgery (Cardiothoracic Vascular Surgery)

## 2011-10-16 ENCOUNTER — Encounter (HOSPITAL_COMMUNITY): Payer: Self-pay

## 2011-10-16 ENCOUNTER — Other Ambulatory Visit: Payer: Self-pay

## 2011-10-16 DIAGNOSIS — I2510 Atherosclerotic heart disease of native coronary artery without angina pectoris: Secondary | ICD-10-CM

## 2011-10-16 DIAGNOSIS — I359 Nonrheumatic aortic valve disorder, unspecified: Secondary | ICD-10-CM

## 2011-10-16 HISTORY — DX: Spinal stenosis, site unspecified: M48.00

## 2011-10-16 LAB — BLOOD GAS, ARTERIAL
Bicarbonate: 24.7 mEq/L — ABNORMAL HIGH (ref 20.0–24.0)
TCO2: 25.8 mmol/L (ref 0–100)
pCO2 arterial: 36.3 mmHg (ref 35.0–45.0)
pH, Arterial: 7.447 — ABNORMAL HIGH (ref 7.350–7.400)

## 2011-10-16 LAB — COMPREHENSIVE METABOLIC PANEL
Alkaline Phosphatase: 87 U/L (ref 39–117)
BUN: 15 mg/dL (ref 6–23)
CO2: 24 mEq/L (ref 19–32)
Chloride: 106 mEq/L (ref 96–112)
GFR calc Af Amer: >90 mL/min (ref >90)
Glucose, Bld: 75 mg/dL (ref 70–99)
Potassium: 4.6 mEq/L (ref 3.5–5.1)
Total Bilirubin: 0.5 mg/dL (ref 0.3–1.2)

## 2011-10-16 LAB — CBC
HCT: 36.9 % (ref 36.0–46.0)
Hemoglobin: 12.1 g/dL (ref 12.0–15.0)
WBC: 8.8 10*3/uL (ref 4.0–10.5)

## 2011-10-16 LAB — TYPE AND SCREEN: ABO/RH(D): AB POS

## 2011-10-16 LAB — HEMOGLOBIN A1C: Hgb A1c MFr Bld: 6.6 % — ABNORMAL HIGH (ref <5.7)

## 2011-10-16 LAB — URINALYSIS, ROUTINE W REFLEX MICROSCOPIC
Glucose, UA: NEGATIVE mg/dL
Leukocytes, UA: NEGATIVE
Protein, ur: NEGATIVE mg/dL
pH: 7 (ref 5.0–8.0)

## 2011-10-16 LAB — ABO/RH: ABO/RH(D): AB POS

## 2011-10-16 MED ORDER — PLASMA-LYTE 148 IV SOLN
INTRAVENOUS | Status: AC
  Administered 2011-10-17: 08:00:00
  Filled 2011-10-16: qty 0.5

## 2011-10-16 MED ORDER — METOPROLOL TARTRATE 12.5 MG HALF TABLET
12.5000 mg | ORAL_TABLET | Freq: Once | ORAL | Status: AC
  Administered 2011-10-17: 12.5 mg via ORAL

## 2011-10-16 MED ORDER — EPINEPHRINE HCL 1 MG/ML IJ SOLN
0.5000 ug/min | INTRAVENOUS | Status: DC
  Filled 2011-10-16: qty 4

## 2011-10-16 MED ORDER — NITROGLYCERIN IN D5W 200-5 MCG/ML-% IV SOLN
2.0000 ug/min | INTRAVENOUS | Status: DC
  Filled 2011-10-16: qty 250

## 2011-10-16 MED ORDER — DEXTROSE 5 % IV SOLN
750.0000 mg | INTRAVENOUS | Status: DC
  Filled 2011-10-16: qty 750

## 2011-10-16 MED ORDER — MAGNESIUM SULFATE 50 % IJ SOLN
40.0000 meq | INTRAMUSCULAR | Status: DC
  Filled 2011-10-16: qty 10

## 2011-10-16 MED ORDER — VANCOMYCIN HCL 1000 MG IV SOLR
1500.0000 mg | INTRAVENOUS | Status: DC
  Filled 2011-10-16: qty 1500

## 2011-10-16 MED ORDER — DOPAMINE-DEXTROSE 3.2-5 MG/ML-% IV SOLN
2.0000 ug/kg/min | INTRAVENOUS | Status: DC
  Filled 2011-10-16: qty 250

## 2011-10-16 MED ORDER — SODIUM CHLORIDE 0.9 % IV SOLN
0.1000 ug/kg/h | INTRAVENOUS | Status: DC
  Filled 2011-10-16: qty 4

## 2011-10-16 MED ORDER — PHENYLEPHRINE HCL 10 MG/ML IJ SOLN
30.0000 ug/min | INTRAVENOUS | Status: DC
  Filled 2011-10-16: qty 2

## 2011-10-16 MED ORDER — POTASSIUM CHLORIDE 2 MEQ/ML IV SOLN
80.0000 meq | INTRAVENOUS | Status: DC
  Filled 2011-10-16: qty 40

## 2011-10-16 MED ORDER — SODIUM CHLORIDE 0.9 % IV SOLN
INTRAVENOUS | Status: DC
  Filled 2011-10-16: qty 1

## 2011-10-16 MED ORDER — CHLORHEXIDINE GLUCONATE 4 % EX LIQD: 30.0000 mL | CUTANEOUS | Status: DC

## 2011-10-16 MED ORDER — DEXTROSE 5 % IV SOLN
1.5000 g | INTRAVENOUS | Status: DC
  Filled 2011-10-16: qty 1.5

## 2011-10-16 MED ORDER — SODIUM CHLORIDE 0.9 % IV SOLN
INTRAVENOUS | Status: DC
  Filled 2011-10-16: qty 40

## 2011-10-16 NOTE — Pre-Procedure Instructions (Signed)
20 Michele Schultz  10/16/2011   Your procedure is scheduled on:  10/17/2011  Report to [facility] at 0530 AM.  Call this number if you have problems the morning of surgery: [phone]   Remember:   Do not eat food:After Midnight.  Do not drink clear liquids: 4 Hours before arrival.  Take these medicines the morning of surgery with A SIP OF WATER: pain medicine if needed , amlodopine, protonix, metoprolol   Do not wear jewelry, make-up or nail polish.  Do not wear lotions, powders, or perfumes. You may wear deodorant.  Do not shave 48 hours prior to surgery.  Do not bring valuables to the hospital.  Contacts, dentures or bridgework may not be worn into surgery.  Leave suitcase in the car. After surgery it may be brought to your room.  For patients admitted to the hospital, checkout time is 11:00 AM the day of discharge.   Patients discharged the day of surgery will not be allowed to drive home.  Name and phone number of your driver:   Special Instructions: Incentive Spirometry - Practice and bring it with you on the day of surgery.   Please read over the following fact sheets that you were given: Pain Booklet, Coughing and Deep Breathing, Blood Transfusion Information, Open Heart Packet, MRSA Information and Surgical Site Infection Prevention

## 2011-10-16 NOTE — Progress Notes (Signed)
Call to [facility] at [facility], requested cardiac records from [facility]. Left message on voice mail.

## 2011-10-17 ENCOUNTER — Encounter (HOSPITAL_COMMUNITY): Payer: Self-pay | Admitting: *Deleted

## 2011-10-17 ENCOUNTER — Encounter (HOSPITAL_COMMUNITY): Payer: Self-pay | Admitting: Surgery

## 2011-10-17 ENCOUNTER — Other Ambulatory Visit: Payer: Self-pay

## 2011-10-17 ENCOUNTER — Other Ambulatory Visit: Payer: Self-pay | Admitting: Thoracic Surgery (Cardiothoracic Vascular Surgery)

## 2011-10-17 ENCOUNTER — Inpatient Hospital Stay (HOSPITAL_COMMUNITY): Payer: Medicare Other

## 2011-10-17 ENCOUNTER — Encounter (HOSPITAL_COMMUNITY)
Admission: RE | Disposition: A | Discharge status: Skilled Nursing Facility | Payer: Self-pay | Source: Ambulatory Visit | Attending: Thoracic Surgery (Cardiothoracic Vascular Surgery)

## 2011-10-17 ENCOUNTER — Inpatient Hospital Stay (HOSPITAL_COMMUNITY): Payer: Medicare Other | Admitting: *Deleted

## 2011-10-17 ENCOUNTER — Inpatient Hospital Stay (HOSPITAL_COMMUNITY)
Admission: RE | Admit: 2011-10-17 | Discharge: 2011-10-30 | DRG: 219 | Disposition: A | Discharge status: Skilled Nursing Facility | Payer: Medicare Other | Source: Ambulatory Visit | Attending: Thoracic Surgery (Cardiothoracic Vascular Surgery) | Admitting: Thoracic Surgery (Cardiothoracic Vascular Surgery)

## 2011-10-17 DIAGNOSIS — D62 Acute posthemorrhagic anemia: Secondary | ICD-10-CM | POA: Diagnosis not present

## 2011-10-17 DIAGNOSIS — I359 Nonrheumatic aortic valve disorder, unspecified: Principal | ICD-10-CM

## 2011-10-17 DIAGNOSIS — I498 Other specified cardiac arrhythmias: Secondary | ICD-10-CM | POA: Diagnosis not present

## 2011-10-17 DIAGNOSIS — I350 Nonrheumatic aortic (valve) stenosis: Secondary | ICD-10-CM | POA: Insufficient documentation

## 2011-10-17 DIAGNOSIS — G8918 Other acute postprocedural pain: Secondary | ICD-10-CM | POA: Diagnosis not present

## 2011-10-17 DIAGNOSIS — Z96649 Presence of unspecified artificial hip joint: Secondary | ICD-10-CM

## 2011-10-17 DIAGNOSIS — I25810 Atherosclerosis of coronary artery bypass graft(s) without angina pectoris: Secondary | ICD-10-CM | POA: Diagnosis present

## 2011-10-17 DIAGNOSIS — R5381 Other malaise: Secondary | ICD-10-CM | POA: Diagnosis not present

## 2011-10-17 DIAGNOSIS — I2510 Atherosclerotic heart disease of native coronary artery without angina pectoris: Secondary | ICD-10-CM

## 2011-10-17 DIAGNOSIS — I10 Essential (primary) hypertension: Secondary | ICD-10-CM | POA: Diagnosis present

## 2011-10-17 DIAGNOSIS — Z7901 Long term (current) use of anticoagulants: Secondary | ICD-10-CM

## 2011-10-17 DIAGNOSIS — E785 Hyperlipidemia, unspecified: Secondary | ICD-10-CM | POA: Diagnosis present

## 2011-10-17 DIAGNOSIS — I5033 Acute on chronic diastolic (congestive) heart failure: Secondary | ICD-10-CM | POA: Diagnosis not present

## 2011-10-17 DIAGNOSIS — K5900 Constipation, unspecified: Secondary | ICD-10-CM | POA: Diagnosis not present

## 2011-10-17 DIAGNOSIS — Z96659 Presence of unspecified artificial knee joint: Secondary | ICD-10-CM

## 2011-10-17 DIAGNOSIS — I442 Atrioventricular block, complete: Secondary | ICD-10-CM | POA: Diagnosis not present

## 2011-10-17 DIAGNOSIS — E669 Obesity, unspecified: Secondary | ICD-10-CM | POA: Diagnosis present

## 2011-10-17 DIAGNOSIS — Z79899 Other long term (current) drug therapy: Secondary | ICD-10-CM

## 2011-10-17 DIAGNOSIS — Z7982 Long term (current) use of aspirin: Secondary | ICD-10-CM

## 2011-10-17 HISTORY — PX: CORONARY ARTERY BYPASS GRAFT: SHX141

## 2011-10-17 HISTORY — PX: AORTIC VALVE REPLACEMENT: SHX41

## 2011-10-17 LAB — POCT I-STAT 4, (NA,K, GLUC, HGB,HCT)
Glucose, Bld: 126 mg/dL — ABNORMAL HIGH (ref 70–99)
Glucose, Bld: 133 mg/dL — ABNORMAL HIGH (ref 70–99)
Glucose, Bld: 139 mg/dL — ABNORMAL HIGH (ref 70–99)
HCT: 37 % (ref 36.0–46.0)
HCT: 25 % — ABNORMAL LOW (ref 36.0–46.0)
HCT: 28 % — ABNORMAL LOW (ref 36.0–46.0)
Hemoglobin: 12.6 g/dL (ref 12.0–15.0)
Hemoglobin: 9.5 g/dL — ABNORMAL LOW (ref 12.0–15.0)
Potassium: 3.7 mEq/L (ref 3.5–5.1)
Potassium: 4 mEq/L (ref 3.5–5.1)
Potassium: 3.5 mEq/L (ref 3.5–5.1)
Sodium: 138 mEq/L (ref 135–145)
Sodium: 141 mEq/L (ref 135–145)
Sodium: 143 mEq/L (ref 135–145)
Sodium: 142 mEq/L (ref 135–145)

## 2011-10-17 LAB — CBC
HCT: 27.6 % — ABNORMAL LOW (ref 36.0–46.0)
Hemoglobin: 9.3 g/dL — ABNORMAL LOW (ref 12.0–15.0)
MCH: 29.4 pg (ref 26.0–34.0)
MCHC: 33.2 g/dL (ref 30.0–36.0)
MCHC: 33.7 g/dL (ref 30.0–36.0)
MCV: 88.2 fL (ref 78.0–100.0)
Platelets: 128 10*3/uL — ABNORMAL LOW (ref 150–400)
RBC: 3.2 MIL/uL — ABNORMAL LOW (ref 3.87–5.11)
RDW: 13.8 % (ref 11.5–15.5)
WBC: 13.6 10*3/uL — ABNORMAL HIGH (ref 4.0–10.5)

## 2011-10-17 LAB — POCT I-STAT, CHEM 8
Chloride: 108 mEq/L (ref 96–112)
Glucose, Bld: 162 mg/dL — ABNORMAL HIGH (ref 70–99)
HCT: 28 % — ABNORMAL LOW (ref 36.0–46.0)
Potassium: 3.7 mEq/L (ref 3.5–5.1)
Sodium: 142 mEq/L (ref 135–145)

## 2011-10-17 LAB — POCT I-STAT 3, ART BLOOD GAS (G3+)
Acid-base deficit: 1 mmol/L (ref 0.0–2.0)
Bicarbonate: 22.7 mEq/L (ref 20.0–24.0)
O2 Saturation: 100 %
Patient temperature: 34.8
TCO2: 28 mmol/L (ref 0–100)
TCO2: 24 mmol/L (ref 0–100)
pH, Arterial: 7.497 — ABNORMAL HIGH (ref 7.350–7.400)
pO2, Arterial: 148 mmHg — ABNORMAL HIGH (ref 80.0–100.0)

## 2011-10-17 LAB — GLUCOSE, CAPILLARY
Glucose-Capillary: 121 mg/dL — ABNORMAL HIGH (ref 70–99)
Glucose-Capillary: 133 mg/dL — ABNORMAL HIGH (ref 70–99)
Glucose-Capillary: 97 mg/dL (ref 70–99)
Glucose-Capillary: 103 mg/dL — ABNORMAL HIGH (ref 70–99)

## 2011-10-17 LAB — CREATININE, SERUM: GFR calc non Af Amer: >90 mL/min (ref >90)

## 2011-10-17 LAB — HEMOGLOBIN AND HEMATOCRIT, BLOOD: Hemoglobin: 8.3 g/dL — ABNORMAL LOW (ref 12.0–15.0)

## 2011-10-17 LAB — PLATELET COUNT: Platelets: 146 10*3/uL — ABNORMAL LOW (ref 150–400)

## 2011-10-17 LAB — PROTIME-INR: Prothrombin Time: 18.6 seconds — ABNORMAL HIGH (ref 11.6–15.2)

## 2011-10-17 SURGERY — REPLACEMENT, AORTIC VALVE, OPEN
Anesthesia: General | Site: Chest | Wound class: Clean

## 2011-10-17 MED ORDER — INSULIN GLARGINE 100 UNIT/ML ~~LOC~~ SOLN
20.0000 [IU] | SUBCUTANEOUS | Status: DC
  Administered 2011-10-18 – 2011-10-20 (×3): 20 [IU] via SUBCUTANEOUS
  Filled 2011-10-17: qty 3

## 2011-10-17 MED ORDER — ALBUMIN HUMAN 5 % IV SOLN: 12.5000 g | Freq: Once | INTRAVENOUS | Status: DC

## 2011-10-17 MED ORDER — INSULIN ASPART 100 UNIT/ML ~~LOC~~ SOLN
0.0000 [IU] | SUBCUTANEOUS | Status: DC
  Administered 2011-10-18: 2 [IU] via SUBCUTANEOUS
  Filled 2011-10-17: qty 3

## 2011-10-17 MED ORDER — BISACODYL 10 MG RE SUPP: 10.0000 mg | Freq: Every day | RECTAL | Status: DC

## 2011-10-17 MED ORDER — SODIUM CHLORIDE 0.9 % IJ SOLN
3.0000 mL | Freq: Two times a day (BID) | INTRAMUSCULAR | Status: DC
  Administered 2011-10-18 – 2011-10-20 (×3): 3 mL via INTRAVENOUS

## 2011-10-17 MED ORDER — ALBUTEROL SULFATE (5 MG/ML) 0.5% IN NEBU
INHALATION_SOLUTION | RESPIRATORY_TRACT | Status: AC
  Administered 2011-10-17: 2.5 mg via RESPIRATORY_TRACT
  Filled 2011-10-17: qty 0.5

## 2011-10-17 MED ORDER — ALBUMIN HUMAN 5 % IV SOLN: 12.5000 g | INTRAVENOUS | Status: AC

## 2011-10-17 MED ORDER — SODIUM CHLORIDE 0.9 % IV SOLN
10.0000 g | INTRAVENOUS | Status: DC | PRN
  Administered 2011-10-17: 5 g/h via INTRAVENOUS

## 2011-10-17 MED ORDER — ONE-DAILY MULTI VITAMINS PO TABS: 1.0000 | ORAL_TABLET | Freq: Every day | ORAL | Status: DC

## 2011-10-17 MED ORDER — HEMOSTATIC AGENTS (NO CHARGE) OPTIME
TOPICAL | Status: DC | PRN
  Administered 2011-10-17: 1 via TOPICAL

## 2011-10-17 MED ORDER — INSULIN ASPART 100 UNIT/ML ~~LOC~~ SOLN
0.0000 [IU] | SUBCUTANEOUS | Status: AC
  Administered 2011-10-17 – 2011-10-18 (×3): 4 [IU] via SUBCUTANEOUS

## 2011-10-17 MED ORDER — SODIUM CHLORIDE 0.9 % IV SOLN: INTRAVENOUS | Status: DC

## 2011-10-17 MED ORDER — PHENYLEPHRINE HCL 10 MG/ML IJ SOLN
0.0000 ug/min | INTRAVENOUS | Status: DC
  Filled 2011-10-17: qty 2

## 2011-10-17 MED ORDER — LACTATED RINGERS IV SOLN
INTRAVENOUS | Status: DC | PRN
  Administered 2011-10-17 (×3): via INTRAVENOUS

## 2011-10-17 MED ORDER — INSULIN ASPART 100 UNIT/ML ~~LOC~~ SOLN: 3.0000 [IU] | Freq: Three times a day (TID) | SUBCUTANEOUS | Status: DC

## 2011-10-17 MED ORDER — SODIUM CHLORIDE 0.9 % IR SOLN
Status: DC | PRN
  Administered 2011-10-17: 1000 mL

## 2011-10-17 MED ORDER — VECURONIUM BROMIDE 10 MG IV SOLR
INTRAVENOUS | Status: DC | PRN
  Administered 2011-10-17 (×2): 10 mg via INTRAVENOUS

## 2011-10-17 MED ORDER — ESMOLOL HCL 10 MG/ML IV SOLN
INTRAVENOUS | Status: DC | PRN
  Administered 2011-10-17: 5 mg via INTRAVENOUS

## 2011-10-17 MED ORDER — METOPROLOL TARTRATE 25 MG/10 ML ORAL SUSPENSION
12.5000 mg | Freq: Two times a day (BID) | ORAL | Status: DC
  Filled 2011-10-17 (×3): qty 5

## 2011-10-17 MED ORDER — MORPHINE SULFATE 2 MG/ML IJ SOLN
1.0000 mg | INTRAMUSCULAR | Status: AC | PRN
  Filled 2011-10-17: qty 1

## 2011-10-17 MED ORDER — SODIUM CHLORIDE 0.9 % IV SOLN
100.0000 [IU] | INTRAVENOUS | Status: DC | PRN
  Administered 2011-10-17: 1.7 [IU]/h via INTRAVENOUS

## 2011-10-17 MED ORDER — CYCLOSPORINE 0.05 % OP EMUL
1.0000 [drp] | Freq: Two times a day (BID) | OPHTHALMIC | Status: DC
  Administered 2011-10-18 – 2011-10-24 (×10): 1 [drp] via OPHTHALMIC
  Administered 2011-10-24: 8 [drp] via OPHTHALMIC
  Administered 2011-10-25 – 2011-10-29 (×10): 1 [drp] via OPHTHALMIC
  Filled 2011-10-17 (×33): qty 1

## 2011-10-17 MED ORDER — SIMVASTATIN 20 MG PO TABS
20.0000 mg | ORAL_TABLET | Freq: Every day | ORAL | Status: DC
  Administered 2011-10-19 – 2011-10-29 (×11): 20 mg via ORAL
  Filled 2011-10-17 (×13): qty 1

## 2011-10-17 MED ORDER — SODIUM CHLORIDE 0.9 % IV SOLN: 250.0000 mL | INTRAVENOUS | Status: DC

## 2011-10-17 MED ORDER — FENTANYL CITRATE 0.05 MG/ML IJ SOLN
INTRAMUSCULAR | Status: DC | PRN
  Administered 2011-10-17: 250 ug via INTRAVENOUS
  Administered 2011-10-17: 100 ug via INTRAVENOUS
  Administered 2011-10-17: 200 ug via INTRAVENOUS
  Administered 2011-10-17 (×2): 50 ug via INTRAVENOUS
  Administered 2011-10-17: 100 ug via INTRAVENOUS
  Administered 2011-10-17: 50 ug via INTRAVENOUS
  Administered 2011-10-17 (×2): 250 ug via INTRAVENOUS
  Administered 2011-10-17: 200 ug via INTRAVENOUS

## 2011-10-17 MED ORDER — POTASSIUM CHLORIDE 10 MEQ/50ML IV SOLN
10.0000 meq | INTRAVENOUS | Status: AC | PRN
  Administered 2011-10-17 – 2011-10-18 (×3): 10 meq via INTRAVENOUS
  Filled 2011-10-17: qty 50

## 2011-10-17 MED ORDER — ROCURONIUM BROMIDE 100 MG/10ML IV SOLN
INTRAVENOUS | Status: DC | PRN
Start: 2011-10-17 — End: 2011-10-17
  Administered 2011-10-17 (×2): 50 mg via INTRAVENOUS

## 2011-10-17 MED ORDER — LACTATED RINGERS IV SOLN
INTRAVENOUS | Status: DC | PRN
  Administered 2011-10-17 (×2): via INTRAVENOUS

## 2011-10-17 MED ORDER — SODIUM CHLORIDE 0.9 % IV SOLN
200.0000 ug | INTRAVENOUS | Status: DC | PRN
  Administered 2011-10-17: 0.2 ug/kg/h via INTRAVENOUS

## 2011-10-17 MED ORDER — LACTATED RINGERS IV SOLN
INTRAVENOUS | Status: DC | PRN
  Administered 2011-10-17: 08:00:00 via INTRAVENOUS

## 2011-10-17 MED ORDER — ALBUMIN HUMAN 5 % IV SOLN
250.0000 mL | INTRAVENOUS | Status: DC | PRN
  Administered 2011-10-17 (×3): 250 mL via INTRAVENOUS
  Filled 2011-10-17: qty 250

## 2011-10-17 MED ORDER — METOPROLOL TARTRATE 12.5 MG HALF TABLET
12.5000 mg | ORAL_TABLET | Freq: Two times a day (BID) | ORAL | Status: DC
  Filled 2011-10-17: qty 1

## 2011-10-17 MED ORDER — VANCOMYCIN HCL 1000 MG IV SOLR
1500.0000 mg | INTRAVENOUS | Status: DC | PRN
  Administered 2011-10-17: 1500 g via INTRAVENOUS

## 2011-10-17 MED ORDER — SODIUM CHLORIDE 0.9 % IV SOLN
INTRAVENOUS | Status: DC
  Administered 2011-10-17 (×2): 2.4 [IU]/h via INTRAVENOUS
  Filled 2011-10-17: qty 1

## 2011-10-17 MED ORDER — INSULIN ASPART 100 UNIT/ML ~~LOC~~ SOLN
0.0000 [IU] | SUBCUTANEOUS | Status: DC
  Filled 2011-10-17: qty 3

## 2011-10-17 MED ORDER — ACETAMINOPHEN 160 MG/5ML PO SOLN
975.0000 mg | Freq: Four times a day (QID) | ORAL | Status: DC
  Administered 2011-10-18: 975 mg
  Filled 2011-10-17 (×2): qty 40.6

## 2011-10-17 MED ORDER — ALBUTEROL SULFATE (5 MG/ML) 0.5% IN NEBU
2.5000 mg | INHALATION_SOLUTION | Freq: Four times a day (QID) | RESPIRATORY_TRACT | Status: DC | PRN
  Administered 2011-10-17: 2.5 mg via RESPIRATORY_TRACT
  Filled 2011-10-17: qty 0.5

## 2011-10-17 MED ORDER — THERA M PLUS PO TABS
1.0000 | ORAL_TABLET | Freq: Every day | ORAL | Status: DC
  Administered 2011-10-18 – 2011-10-19 (×2): 1 via ORAL
  Filled 2011-10-17 (×3): qty 1

## 2011-10-17 MED ORDER — ACETAMINOPHEN 650 MG RE SUPP
650.0000 mg | RECTAL | Status: AC
  Administered 2011-10-17: 650 mg via RECTAL

## 2011-10-17 MED ORDER — LACTATED RINGERS IV SOLN: 500.0000 mL | Freq: Once | INTRAVENOUS | Status: AC | PRN

## 2011-10-17 MED ORDER — ASPIRIN EC 325 MG PO TBEC
325.0000 mg | DELAYED_RELEASE_TABLET | Freq: Every day | ORAL | Status: DC
  Administered 2011-10-18 – 2011-10-22 (×4): 325 mg via ORAL
  Filled 2011-10-17 (×5): qty 1

## 2011-10-17 MED ORDER — ACETAMINOPHEN 500 MG PO TABS
1000.0000 mg | ORAL_TABLET | Freq: Four times a day (QID) | ORAL | Status: DC
  Administered 2011-10-18 – 2011-10-21 (×7): 1000 mg via ORAL
  Filled 2011-10-17 (×20): qty 2

## 2011-10-17 MED ORDER — CEFUROXIME SODIUM 1.5 G IJ SOLR
1.5000 g | INTRAMUSCULAR | Status: DC | PRN
  Administered 2011-10-17: 1.5 g via INTRAVENOUS

## 2011-10-17 MED ORDER — ASPIRIN 81 MG PO CHEW
324.0000 mg | CHEWABLE_TABLET | Freq: Every day | ORAL | Status: DC
  Filled 2011-10-17: qty 1

## 2011-10-17 MED ORDER — CALCIUM CHLORIDE 10 % IV SOLN
1.0000 g | Freq: Once | INTRAVENOUS | Status: AC
  Administered 2011-10-17: 1 g via INTRAVENOUS
  Filled 2011-10-17: qty 10

## 2011-10-17 MED ORDER — OXYCODONE HCL 5 MG PO TABS
5.0000 mg | ORAL_TABLET | ORAL | Status: DC | PRN
  Administered 2011-10-18: 10 mg via ORAL
  Administered 2011-10-18: 5 mg via ORAL
  Administered 2011-10-18: 10 mg via ORAL
  Administered 2011-10-18: 5 mg via ORAL
  Administered 2011-10-19 (×2): 10 mg via ORAL
  Filled 2011-10-17 (×3): qty 2
  Filled 2011-10-17 (×2): qty 1
  Filled 2011-10-17 (×2): qty 2

## 2011-10-17 MED ORDER — MAGNESIUM SULFATE 50 % IJ SOLN: 40.0000 meq | INTRAMUSCULAR | Status: DC

## 2011-10-17 MED ORDER — BISACODYL 5 MG PO TBEC
10.0000 mg | DELAYED_RELEASE_TABLET | Freq: Every day | ORAL | Status: DC
  Administered 2011-10-18 – 2011-10-22 (×4): 10 mg via ORAL
  Filled 2011-10-17 (×4): qty 2

## 2011-10-17 MED ORDER — NITROGLYCERIN IN D5W 200-5 MCG/ML-% IV SOLN
0.0000 ug/min | INTRAVENOUS | Status: DC
  Administered 2011-10-17: 5 ug/min via INTRAVENOUS

## 2011-10-17 MED ORDER — LACTATED RINGERS IV SOLN
INTRAVENOUS | Status: DC
  Administered 2011-10-17: 16:00:00 via INTRAVENOUS

## 2011-10-17 MED ORDER — SODIUM CHLORIDE 0.9 % IV SOLN
0.1000 ug/kg/h | INTRAVENOUS | Status: DC
  Administered 2011-10-17: 0.5 ug/kg/h via INTRAVENOUS
  Administered 2011-10-17 (×2): 0.7 ug/kg/h via INTRAVENOUS
  Filled 2011-10-17 (×4): qty 2

## 2011-10-17 MED ORDER — METOPROLOL TARTRATE 12.5 MG HALF TABLET
12.5000 mg | ORAL_TABLET | Freq: Two times a day (BID) | ORAL | Status: DC
Start: 2011-10-17 — End: 2011-10-18
  Filled 2011-10-17 (×3): qty 1

## 2011-10-17 MED ORDER — FERROUS SULFATE DRIED ER 140 (45 FE) MG PO TBCR: 1.0000 | EXTENDED_RELEASE_TABLET | Freq: Every day | ORAL | Status: DC

## 2011-10-17 MED ORDER — DOCUSATE SODIUM 100 MG PO CAPS
200.0000 mg | ORAL_CAPSULE | Freq: Every day | ORAL | Status: DC
  Administered 2011-10-18 – 2011-10-22 (×4): 200 mg via ORAL
  Filled 2011-10-17 (×4): qty 2

## 2011-10-17 MED ORDER — ONDANSETRON HCL 4 MG/2ML IJ SOLN
4.0000 mg | Freq: Four times a day (QID) | INTRAMUSCULAR | Status: DC | PRN
  Administered 2011-10-18: 4 mg via INTRAVENOUS

## 2011-10-17 MED ORDER — MIDAZOLAM HCL 5 MG/5ML IJ SOLN
INTRAMUSCULAR | Status: DC | PRN
  Administered 2011-10-17: 5 mg via INTRAVENOUS
  Administered 2011-10-17: 2 mg via INTRAVENOUS
  Administered 2011-10-17: 3 mg via INTRAVENOUS

## 2011-10-17 MED ORDER — OXYBUTYNIN CHLORIDE ER 10 MG PO TB24
10.0000 mg | ORAL_TABLET | Freq: Every day | ORAL | Status: DC
  Administered 2011-10-19 – 2011-10-30 (×11): 10 mg via ORAL
  Filled 2011-10-17 (×12): qty 1

## 2011-10-17 MED ORDER — NITROGLYCERIN IN D5W 200-5 MCG/ML-% IV SOLN
INTRAVENOUS | Status: DC | PRN
  Administered 2011-10-17: 33.3 ug/min via INTRAVENOUS

## 2011-10-17 MED ORDER — PANTOPRAZOLE SODIUM 40 MG PO TBEC: 40.0000 mg | DELAYED_RELEASE_TABLET | Freq: Every day | ORAL | Status: DC

## 2011-10-17 MED ORDER — SODIUM CHLORIDE 0.9 % IV SOLN
INTRAVENOUS | Status: DC | PRN
  Filled 2011-10-17: qty 1

## 2011-10-17 MED ORDER — SODIUM CHLORIDE 0.9 % IJ SOLN: 3.0000 mL | INTRAMUSCULAR | Status: DC | PRN

## 2011-10-17 MED ORDER — FERROUS SULFATE 325 (65 FE) MG PO TABS
325.0000 mg | ORAL_TABLET | Freq: Every day | ORAL | Status: DC
  Administered 2011-10-18 – 2011-10-30 (×12): 325 mg via ORAL
  Filled 2011-10-17 (×15): qty 1

## 2011-10-17 MED ORDER — SODIUM CHLORIDE 0.9 % IJ SOLN
OROMUCOSAL | Status: DC | PRN
  Administered 2011-10-17: 13:00:00 via TOPICAL

## 2011-10-17 MED ORDER — ACETAMINOPHEN 500 MG PO TABS: 1000.0000 mg | ORAL_TABLET | Freq: Four times a day (QID) | ORAL | Status: DC

## 2011-10-17 MED ORDER — PROTAMINE SULFATE 10 MG/ML IV SOLN
INTRAVENOUS | Status: DC | PRN
  Administered 2011-10-17: 300 mg via INTRAVENOUS

## 2011-10-17 MED ORDER — PROPOFOL 10 MG/ML IV EMUL
INTRAVENOUS | Status: DC | PRN
  Administered 2011-10-17: 110 mg via INTRAVENOUS

## 2011-10-17 MED ORDER — MAGNESIUM SULFATE 40 MG/ML IJ SOLN
4.0000 g | Freq: Once | INTRAMUSCULAR | Status: AC
  Administered 2011-10-17: 4 g via INTRAVENOUS
  Filled 2011-10-17: qty 100

## 2011-10-17 MED ORDER — ACETAMINOPHEN 160 MG/5ML PO SOLN: 975.0000 mg | Freq: Four times a day (QID) | ORAL | Status: DC

## 2011-10-17 MED ORDER — MAGNESIUM SULFATE 50 % IJ SOLN
4.0000 g | Freq: Once | INTRAMUSCULAR | Status: AC
  Filled 2011-10-17: qty 8

## 2011-10-17 MED ORDER — INSULIN ASPART 100 UNIT/ML ~~LOC~~ SOLN: 0.0000 [IU] | SUBCUTANEOUS | Status: DC

## 2011-10-17 MED ORDER — MORPHINE SULFATE 4 MG/ML IJ SOLN
2.0000 mg | INTRAMUSCULAR | Status: DC | PRN
  Administered 2011-10-17 – 2011-10-18 (×3): 4 mg via INTRAVENOUS
  Administered 2011-10-18 (×2): 2 mg via INTRAVENOUS
  Administered 2011-10-18: 4 mg via INTRAVENOUS
  Administered 2011-10-18: 2 mg via INTRAVENOUS
  Administered 2011-10-18: 4 mg via INTRAVENOUS
  Administered 2011-10-19: 2 mg via INTRAVENOUS
  Administered 2011-10-20: 4 mg via INTRAVENOUS
  Filled 2011-10-17 (×9): qty 1

## 2011-10-17 MED ORDER — METOPROLOL TARTRATE 25 MG/10 ML ORAL SUSPENSION
12.5000 mg | Freq: Two times a day (BID) | ORAL | Status: DC
  Filled 2011-10-17: qty 5

## 2011-10-17 MED ORDER — SODIUM CHLORIDE 0.45 % IV SOLN
INTRAVENOUS | Status: DC
  Administered 2011-10-17 – 2011-10-19 (×2): via INTRAVENOUS

## 2011-10-17 MED ORDER — VANCOMYCIN HCL 1000 MG IV SOLR
1000.0000 mg | INTRAVENOUS | Status: AC
  Administered 2011-10-17 – 2011-10-18 (×2): 1000 mg via INTRAVENOUS
  Filled 2011-10-17 (×2): qty 1000

## 2011-10-17 MED ORDER — ACETAMINOPHEN 160 MG/5ML PO SOLN: 650.0000 mg | ORAL | Status: AC

## 2011-10-17 MED ORDER — HEPARIN SODIUM (PORCINE) 1000 UNIT/ML IJ SOLN
INTRAMUSCULAR | Status: DC | PRN
  Administered 2011-10-17: 2000 [IU] via INTRAVENOUS
  Administered 2011-10-17: 28000 [IU] via INTRAVENOUS

## 2011-10-17 MED ORDER — DEXTROSE 5 % IV SOLN
0.7500 g | INTRAVENOUS | Status: DC | PRN
  Administered 2011-10-17: .75 g via INTRAVENOUS

## 2011-10-17 MED ORDER — METOPROLOL TARTRATE 1 MG/ML IV SOLN: 2.5000 mg | INTRAVENOUS | Status: DC | PRN

## 2011-10-17 MED ORDER — PHENYLEPHRINE HCL 10 MG/ML IJ SOLN
10000.0000 ug | INTRAVENOUS | Status: DC | PRN
  Administered 2011-10-17: 25 ug/min via INTRAVENOUS

## 2011-10-17 MED ORDER — SODIUM CHLORIDE 0.9 % IJ SOLN
10.0000 mL | INTRAMUSCULAR | Status: DC | PRN
  Administered 2011-10-17 – 2011-10-18 (×2): 10 mL via INTRAVENOUS
  Administered 2011-10-18 (×2): 20 mL via INTRAVENOUS
  Administered 2011-10-19 – 2011-10-20 (×2): 10 mL via INTRAVENOUS

## 2011-10-17 MED ORDER — MIDAZOLAM HCL 2 MG/2ML IJ SOLN: 2.0000 mg | INTRAMUSCULAR | Status: DC | PRN

## 2011-10-17 MED ORDER — POTASSIUM CHLORIDE 10 MEQ/50ML IV SOLN
10.0000 meq | INTRAVENOUS | Status: AC
  Administered 2011-10-17 (×4): 10 meq via INTRAVENOUS

## 2011-10-17 MED FILL — Vancomycin HCl For IV Soln 1 GM (Base Equivalent): INTRAVENOUS | Qty: 1500 | Status: AC

## 2011-10-17 SURGICAL SUPPLY — 120 items
ADAPTER CARDIO PERF ANTE/RETRO (ADAPTER) ×2 IMPLANT
APPLICATOR COTTON TIP 6IN STRL (MISCELLANEOUS) IMPLANT
APPLIER CLIP 9.375 SM OPEN (CLIP)
ATTRACTOMAT 16X20 MAGNETIC DRP (DRAPES) ×2 IMPLANT
BAG DECANTER FOR FLEXI CONT (MISCELLANEOUS) ×2 IMPLANT
BANDAGE ELASTIC 4 VELCRO ST LF (GAUZE/BANDAGES/DRESSINGS) ×2 IMPLANT
BANDAGE ELASTIC 6 VELCRO ST LF (GAUZE/BANDAGES/DRESSINGS) ×2 IMPLANT
BANDAGE GAUZE ELAST BULKY 4 IN (GAUZE/BANDAGES/DRESSINGS) ×2 IMPLANT
BASKET HEART (ORDER IN 25'S) (MISCELLANEOUS) ×1
BASKET HEART (ORDER IN 25S) (MISCELLANEOUS) ×1 IMPLANT
BLADE SAW STERNAL (BLADE) ×2 IMPLANT
BLADE SURG 11 STRL SS (BLADE) ×2 IMPLANT
BLADE SURG 15 STRL LF DISP TIS (BLADE) ×1 IMPLANT
BLADE SURG 15 STRL SS (BLADE) ×1
CANISTER SUCTION 2500CC (MISCELLANEOUS) ×2 IMPLANT
CANN PRFSN .5XCNCT 15X34-48 (MISCELLANEOUS) ×1
CANNULA GUNDRY RCSP 15FR (MISCELLANEOUS) ×2 IMPLANT
CANNULA PRFSN .5XCNCT 15X34-48 (MISCELLANEOUS) ×1 IMPLANT
CANNULA VEN 2 STAGE (MISCELLANEOUS) ×1
CATH CPB KIT HENDRICKSON (MISCELLANEOUS) ×2 IMPLANT
CATH ROBINSON RED A/P 18FR (CATHETERS) ×8 IMPLANT
CATH THORACIC 36FR RT ANG (CATHETERS) IMPLANT
CLIP APPLIE 9.375 SM OPEN (CLIP) IMPLANT
CLIP FOGARTY SPRING 6M (CLIP) IMPLANT
CLIP TI MEDIUM 24 (CLIP) IMPLANT
CLIP TI WIDE RED SMALL 24 (CLIP) ×2 IMPLANT
CLOTH BEACON ORANGE TIMEOUT ST (SAFETY) ×2 IMPLANT
CONN Y 3/8X3/8X3/8  BEN (MISCELLANEOUS)
CONN Y 3/8X3/8X3/8 BEN (MISCELLANEOUS) IMPLANT
CONT SPEC 4OZ CLIKSEAL STRL BL (MISCELLANEOUS) ×2 IMPLANT
COVER SURGICAL LIGHT HANDLE (MISCELLANEOUS) ×4 IMPLANT
CRADLE DONUT ADULT HEAD (MISCELLANEOUS) ×2 IMPLANT
DERMABOND ADHESIVE PROPEN (GAUZE/BANDAGES/DRESSINGS) ×1
DERMABOND ADVANCED .7 DNX6 (GAUZE/BANDAGES/DRESSINGS) ×1 IMPLANT
DRAPE CARDIOVASCULAR INCISE (DRAPES) ×1
DRAPE SLUSH/WARMER DISC (DRAPES) IMPLANT
DRAPE SRG 135X102X78XABS (DRAPES) ×1 IMPLANT
DRSG COVADERM 4X14 (GAUZE/BANDAGES/DRESSINGS) ×2 IMPLANT
ELECT PAD GROUND ADT 9 (MISCELLANEOUS) IMPLANT
ELECT REM PT RETURN 9FT ADLT (ELECTROSURGICAL) ×4
ELECTRODE REM PT RTRN 9FT ADLT (ELECTROSURGICAL) ×2 IMPLANT
GLOVE BIO SURGEON STRL SZ 6 (GLOVE) ×4 IMPLANT
GLOVE BIO SURGEON STRL SZ 6.5 (GLOVE) ×10 IMPLANT
GLOVE BIO SURGEON STRL SZ7 (GLOVE) ×4 IMPLANT
GLOVE BIO SURGEON STRL SZ7.5 (GLOVE) IMPLANT
GLOVE EUDERMIC 7 POWDERFREE (GLOVE) ×8 IMPLANT
GOWN PREVENTION PLUS XLARGE (GOWN DISPOSABLE) ×4 IMPLANT
GOWN STRL NON-REIN LRG LVL3 (GOWN DISPOSABLE) ×12 IMPLANT
HEART VENT LT CURVED (MISCELLANEOUS) ×2 IMPLANT
HEMOSTAT POWDER SURGIFOAM 1G (HEMOSTASIS) ×6 IMPLANT
HEMOSTAT SURGICEL 2X14 (HEMOSTASIS) ×2 IMPLANT
INSERT FOGARTY XLG (MISCELLANEOUS) IMPLANT
KIT BASIN OR (CUSTOM PROCEDURE TRAY) ×2 IMPLANT
KIT PAIN CUSTOM (MISCELLANEOUS) IMPLANT
KIT ROOM TURNOVER OR (KITS) ×2 IMPLANT
KIT SUCTION CATH 14FR (SUCTIONS) ×8 IMPLANT
KIT VASOVIEW W/TROCAR VH 2000 (KITS) ×2 IMPLANT
LINE VENT (MISCELLANEOUS) ×2 IMPLANT
MARKER GRAFT CORONARY BYPASS (MISCELLANEOUS) ×6 IMPLANT
NS IRRIG 1000ML POUR BTL (IV SOLUTION) ×14 IMPLANT
PACK OPEN HEART (CUSTOM PROCEDURE TRAY) ×2 IMPLANT
PAD ARMBOARD 7.5X6 YLW CONV (MISCELLANEOUS) ×2 IMPLANT
PENCIL BUTTON HOLSTER BLD 10FT (ELECTRODE) ×2 IMPLANT
PUNCH AORTIC ROTATE 4.0MM (MISCELLANEOUS) IMPLANT
PUNCH AORTIC ROTATE 4.5MM 8IN (MISCELLANEOUS) ×2 IMPLANT
PUNCH AORTIC ROTATE 5MM 8IN (MISCELLANEOUS) IMPLANT
SET CARDIOPLEGIA MPS 5001102 (MISCELLANEOUS) ×2 IMPLANT
SPONGE GAUZE 4X4 12PLY (GAUZE/BANDAGES/DRESSINGS) ×4 IMPLANT
SPONGE GAUZE 4X4 FOR O.R. (GAUZE/BANDAGES/DRESSINGS) ×4 IMPLANT
SUT ETHIBON 2 0 V 52N 30 (SUTURE) ×4 IMPLANT
SUT ETHIBON EXCEL 2-0 V-5 (SUTURE) IMPLANT
SUT ETHIBOND 2 0 SH (SUTURE) ×2
SUT ETHIBOND 2 0 SH 36X2 (SUTURE) ×2 IMPLANT
SUT ETHIBOND 2 0 V4 (SUTURE) IMPLANT
SUT ETHIBOND 2 0V4 GREEN (SUTURE) IMPLANT
SUT ETHIBOND 4 0 RB 1 (SUTURE) IMPLANT
SUT ETHIBOND V-5 VALVE (SUTURE) IMPLANT
SUT MNCRL AB 4-0 PS2 18 (SUTURE) IMPLANT
SUT PROLENE 3 0 SH 1 (SUTURE) ×4 IMPLANT
SUT PROLENE 3 0 SH DA (SUTURE) ×2 IMPLANT
SUT PROLENE 4 0 RB 1 (SUTURE) ×7
SUT PROLENE 4 0 SH DA (SUTURE) IMPLANT
SUT PROLENE 4-0 RB1 .5 CRCL 36 (SUTURE) ×7 IMPLANT
SUT PROLENE 5 0 C 1 36 (SUTURE) ×6 IMPLANT
SUT PROLENE 6 0 C 1 30 (SUTURE) ×6 IMPLANT
SUT PROLENE 7 0 BV 1 (SUTURE) ×6 IMPLANT
SUT PROLENE 7 0 BV1 MDA (SUTURE) ×2 IMPLANT
SUT PROLENE 8 0 BV175 6 (SUTURE) IMPLANT
SUT SILK  1 MH (SUTURE) ×2
SUT SILK 1 MH (SUTURE) ×2 IMPLANT
SUT SILK 1 TIES 10X30 (SUTURE) ×2 IMPLANT
SUT SILK 2 0 (SUTURE) ×1
SUT SILK 2 0 SH CR/8 (SUTURE) ×4 IMPLANT
SUT SILK 2-0 18XBRD TIE 12 (SUTURE) ×1 IMPLANT
SUT SILK 3 0 SH CR/8 (SUTURE) ×2 IMPLANT
SUT SILK 4 0 (SUTURE) ×1
SUT SILK 4-0 18XBRD TIE 12 (SUTURE) ×1 IMPLANT
SUT STEEL 6MS V (SUTURE) IMPLANT
SUT STEEL STERNAL CCS#1 18IN (SUTURE) IMPLANT
SUT STEEL SZ 6 DBL 3X14 BALL (SUTURE) IMPLANT
SUT TEM PAC WIRE 2 0 SH (SUTURE) ×8 IMPLANT
SUT VIC AB 1 CTX 36 (SUTURE) ×2
SUT VIC AB 1 CTX36XBRD ANBCTR (SUTURE) ×2 IMPLANT
SUT VIC AB 2-0 CT1 27 (SUTURE) ×2
SUT VIC AB 2-0 CT1 TAPERPNT 27 (SUTURE) ×2 IMPLANT
SUT VIC AB 2-0 CTX 27 (SUTURE) ×4 IMPLANT
SUT VIC AB 3-0 SH 27 (SUTURE)
SUT VIC AB 3-0 SH 27X BRD (SUTURE) IMPLANT
SUT VIC AB 3-0 X1 27 (SUTURE) ×4 IMPLANT
SUT VICRYL 4-0 PS2 18IN ABS (SUTURE) ×2 IMPLANT
SUTURE E-PAK OPEN HEART (SUTURE) ×2 IMPLANT
SYR 10ML KIT SKIN ADHESIVE (MISCELLANEOUS) IMPLANT
SYSTEM SAHARA CHEST DRAIN ATS (WOUND CARE) ×2 IMPLANT
TOWEL OR 17X24 6PK STRL BLUE (TOWEL DISPOSABLE) ×2 IMPLANT
TOWEL OR 17X26 10 PK STRL BLUE (TOWEL DISPOSABLE) ×2 IMPLANT
TRAY FOLEY IC TEMP SENS 14FR (CATHETERS) ×2 IMPLANT
TUBING INSUFFLATION 10FT LAP (TUBING) ×2 IMPLANT
UNDERPAD 30X30 INCONTINENT (UNDERPADS AND DIAPERS) ×2 IMPLANT
VALVE MAGNA EASE 21MM (Prosthesis & Implant Heart) ×2 IMPLANT
WATER STERILE IRR 1000ML POUR (IV SOLUTION) ×4 IMPLANT

## 2011-10-17 NOTE — Transfer of Care (Signed)
Immediate Anesthesia Transfer of Care Note  Patient: Michele Schultz  Procedure(s) Performed:  AORTIC VALVE REPLACEMENT (AVR); CORONARY ARTERY BYPASS GRAFTING (CABG) - Times Two, using right leg greater saphenous vein harvested endoscopically  Patient Location: PACU  Anesthesia Type: General  Level of Consciousness: sedated  Airway & Oxygen Therapy: Patient remains intubated per anesthesia plan and Patient placed on Ventilator (see vital sign flow sheet for setting)  Post-op Assessment: Report given to PACU RN  Post vital signs: stable  Complications: No apparent anesthesia complications

## 2011-10-17 NOTE — Op Note (Signed)
Michele Schultz, VONSEGGERN NO.:  [id number]  MEDICAL RECORD NO.:  [id number]  LOCATION:  2303                         FACILITY:  [facility]  PHYSICIAN:  Salvatore Decent. Dorris Fetch, M.D.DATE OF BIRTH:  1931/01/06  DATE OF PROCEDURE:  10/17/2011 DATE OF DISCHARGE:                              OPERATIVE REPORT   PREOPERATIVE DIAGNOSIS:  Critical aortic stenosis and left main coronary disease  POSTOPERATIVE DIAGNOSIS:  Critical aortic stenosis and left main coronary disease.  PROCEDURE:  Coronary artery bypass grafting x2 (saphenous vein graft to LAD, saphenous vein graft to obtuse marginal 1), aortic valve replacement with 21 mm Edwards Magna ease bovine pericardial valve (model [id number], serial #[id number]), endoscopic vein harvest, right thigh.  SURGEON:  Salvatore Decent. Dorris Fetch, MD  ASSISTANT:  Doree Fudge, PA  ANESTHESIA:  General.  FINDINGS:  Tricuspid calcific stenotic aortic valve.  The valve area estimated at 0.55 cm on transesophageal echocardiography.  Mild annular Calcification. Good function of the prosthetic valve on post bypass echo, left ventricular hypertrophy, good quality coronary targets, good quality saphenous vein.  CLINICAL INDICATION:  Ms. Rubendall is a 77 year old woman with no moderate left main disease who presented with progressive shortness of breath with exertion as well as atypical chest pain.  Workup revealed progression of aortic stenosis too severe with a valve area estimated at 0.7 cm2.  Repeat catheterization showed stable 50% left main stenosis. The patient was advised to undergo aortic valve replacement and also advised to have coronary bypass grafting to bypass the left main stenosis at the time of aortic valve replacement.  I did discuss with her the use of saphenous vein rather than left mammary for the LAD graft given that the mammary could be impaired by competitive flow given that there was not a critically tight lesion in the  left main.  I also discussed with her valve options including mechanical and tissue valves. She understood the indications for tissue valve given her age, inability to avoid lifelong anticoagulation.  She understood and accepted the risks of surgery and agreed to proceed.  OPERATIVE NOTE:  Ms. Witting was brought to the preop holding area on October 17, 2011.  The Anesthesia Service placed a Swan-Ganz catheter and arterial blood pressure monitoring line.  Intravenous antibiotics were administered.  She was taken to the operating room, anesthetized and intubated.  A Foley catheter was placed.  The chest, abdomen, and legs were prepped and draped in usual sterile fashion.  Transesophageal echocardiography was performed.  Please refer to the separately dictated note for full details.  There was marked left ventricular hypertrophy. There was critical aortic stenosis.  There was no significant mitral valve pathology.  Incision was made in the medial aspect of the right leg at the level of the knee.  Greater saphenous vein was identified.  It was relatively small.  Incision was made in the medial aspect of the left leg but the vein was not identified there at all.  The vein from the right leg was harvested from the thigh and did turn out to be a good quality vessel, although it did become very small.  In the lower portion of the thigh, this segment of vein  did not need to be utilized.  2000 units of heparin was administered during the vessel harvest.  A median sternotomy was performed and the hemostasis was achieved.  Retractor was placed. Remainder of full heparin dose was given.  The pericardium was opened. The ascending aorta was inspected.  After confirming adequate anticoagulation with ACT measurement, the aorta was cannulated via concentric 2-0 Ethibond pledgeted pursestring sutures.  A dual stage venous cannula was placed via pursestring suture in the right atrial appendage.   Cardiopulmonary bypass was instituted.  The patient was cooled to 28 degrees Celsius.  Left ventricular vent was placed via pursestring suture in the right superior pulmonary vein.  Retrograde cardioplegic cannula was placed via pursestring suture in the right atrium and directed in the coronary sinus.  The coronary arteries were inspected and anastomotic sites were chosen.  The vein was inspected and cut to length.  A foam pad was placed in the pericardium to insulate the heart and protect the left phrenic nerve.  A temperature probe was placed in myocardial septum and a cardioplegic cannula was placed in the ascending aorta.  The aorta was crossclamped.  Left ventricle was emptied with the vent. Cardiac arrest then was achieved with combination of cold antegrade and retrograde blood cardioplegia.  Initial 600 mL of cardioplegia was administered antegrade.  There was a rapid diastolic arrest and rapid septal cooling.  Additional cardioplegia was administered retrograde with the myocardial septal temperature falling to 10 degrees Celsius. Additional cardioplegia was administered at the completion of each vein graft as well as 20 minute intervals during the valve replacement portion of the procedure.  A reversed saphenous vein graft was placed end-to-side to the first obtuse marginal branch of the circumflex.  This was a 1.5 mm good quality target.  The vein was anastomosed end-to-side with a running 7-0 Prolene suture.  Probe was passed easily proximally and distally. Cardioplegia was administered.  There was good flow and good hemostasis.  Next a reverse saphenous vein graft was end-to-side to the mid LAD.  The LAD was a 2 mm good quality vessel at the site of the anastomosis.  The vein graft was good quality.  The anastomosis was performed end-to-side with a running 7-0 Prolene suture.  At completion of anastomosis, cardioplegia was infused via the graft.  There was good flow and  good hemostasis.  Additional cardioplegia was administered retrograde.  An aortotomy was made.  The aortic valve was inspected.  It was tricuspid, calcific, and stenotic.  There was severe valvular stenosis.  The valve leaflets were excised.  There was mild annular calcification.  This was debrided taking care to contain all calcific debris.  After the anulus had been debrided, the anulus was copiously irrigated with iced saline.  The anulus measured for a 21 mm Magna ease bovine pericardial valve.  This was prepared per manufacturer's recommendations.  A 2-0 Ethibond horizontal mattress sutures with subannular pledgets were placed circumferentially.  A total of 15 sutures were utilized.  These were then passed through the sewing ring of the valve and the valve was lowered into place and sutures sequentially tied.  The valve seated nicely.  The anulus was probed with a fine tip right-angle with no gaps noted.  The coronary ostia were free of impingement.  Rewarming was begun.  The aortotomy was closed in 2 layers with running 4-0 Prolene suture. The first was a running horizontal mattress suture followed by a running simple suture.  The vein grafts then  were cut to length and the proximal vein graft anastomoses were performed to 4.5 mm punch aortotomies with running 6-0 Prolene sutures.  At the completion of final proximal anastomosis, the patient was placed in Trendelenburg position.  Lidocaine was administered.  The heart and aorta were de-aired and the aortic crossclamp was removed.  Total crossclamp time was 96 minutes.  The patient initially fibrillated and then was in heart block after defibrillation.  All proximal and distal anastomoses were inspected for hemostasis as well as the aortotomy.  Epicardial pacing wires were placed on the right ventricle and right atrium and the patient was rewarmed to a core temperature of 37 degrees Celsius.  She was weaned from cardiopulmonary  bypass.  On the first attempt, she was DDD paced and on no inotropic support at the time of separation from bypass. Postbypass transesophageal echocardiography revealed good function of the prosthetic valve.  There were no perivalvular leaks.  There was no change in left ventricular hypertrophy and preserved left ventricular systolic function.  The initial cardiac index was greater than 2 L/minute per minute meter squared.  A test dose of protamine was administered and was well tolerated.  The atrial and aortic cannulae were removed.  The remainder of the protamine was administered without incident.  Chest was irrigated with warm saline.  Hemostasis was achieved.  A single mediastinal chest tube was placed through a separate subcostal incision.  The sternum was closed with interrupted heavy gauge stainless steel wires.  The pectoralis fascia, subcutaneous tissue, and skin were closed in standard fashion. All sponge, needle, and instrument counts were correct at the end of the procedure.  The patient was taken from the operating room to the Surgical Intensive Care Unit in hemodynamically stable condition.     Salvatore Decent Dorris Fetch, M.D.     SCH/MEDQ  D:  10/17/2011  T:  10/17/2011  Job:  [id number]  cc:   Antonieta Iba, MD Daniel Nones, MD

## 2011-10-17 NOTE — OR Nursing (Signed)
SICU 2nd call made @ 13:54.  Pt. Has 1 5fr St chest tube in mediastinum, 16fr RA not used.

## 2011-10-17 NOTE — Progress Notes (Signed)
[facility] BRIEF SICU PROGRESS NOTE  Day of Surgery  S/P Procedure(s) (LRB): AORTIC VALVE REPLACEMENT (AVR) (N/A) CORONARY ARTERY BYPASS GRAFTING (CABG) (N/A)   Stable postop so far.  Assessment/Plan:  Continue routine postop care.  Michele Schultz H

## 2011-10-17 NOTE — OR Nursing (Signed)
At 13:23 called volunteer desk to let family know pt. Is off pump; at 13:24 did 1st call to SICU

## 2011-10-17 NOTE — Anesthesia Procedure Notes (Addendum)
Performed by: Brien Mates DOBSON   Performed by: Kerby Nora

## 2011-10-17 NOTE — Brief Op Note (Signed)
[facility] Brief Op Note:  12:41 PM  PATIENT:  Michele Schultz  77 y.o. female  PRE-OPERATIVE DIAGNOSIS:  Aortic stenosis; CAD  POST-OPERATIVE DIAGNOSIS:  Aortic stenosis, CAD  PROCEDURE:  Median sternotomy for CABG x 2 (SVG to LAD, SVG to OM1) with EVH right thigh;aortic valve replacement (using a Magna Ease pericardial tissue valve size 21 mm.)  SURGEON:  Dr. Dorris Fetch  PHYSICIAN ASSISTANT: Doree Fudge PA-C  ANESTHESIA:   general  Cross Clamp: 96 min  CPB time: 130 min.  1 Mediastinal chest tube  PATIENT CONDITION:  ICU - intubated and hemodynamically stable.  PRE-OPERATIVE WEIGHT: 105 kg

## 2011-10-17 NOTE — Anesthesia Postprocedure Evaluation (Signed)
  Anesthesia Post-op Note  Patient: Michele Schultz  Procedure(s) Performed:  AORTIC VALVE REPLACEMENT (AVR); CORONARY ARTERY BYPASS GRAFTING (CABG) - Times Two, using right leg greater saphenous vein harvested endoscopically  Patient Location: PACU and SICU  Anesthesia Type: General  Level of Consciousness: sedated  Airway and Oxygen Therapy: Patient remains intubated per anesthesia plan and Patient placed on Ventilator (see vital sign flow sheet for setting)  Post-op Pain: none  Post-op Assessment: Post-op Vital signs reviewed and Patient's Cardiovascular Status Stable  Post-op Vital Signs: stable  Complications: No apparent anesthesia complications

## 2011-10-17 NOTE — Progress Notes (Signed)
  Echocardiogram Echocardiogram Transesophageal has been performed.  Dorena Cookey 10/17/2011, 12:33 PM

## 2011-10-17 NOTE — Preoperative (Signed)
Beta Blockers   Reason not to administer Beta Blockers:esmolol IV 5 mg given at 0900

## 2011-10-17 NOTE — H&P (Addendum)
PCP is Daniel Nones, [place]  Referring Provider is Mariah Milling, Tollie Pizza, MD  Chief Complaint   Patient presents with   .  Aortic Stenosis     Referral from Dr Mariah Milling for surgical eval, Aortic valve stenosis   HPI:  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 Julien Nordmann regarding severe aortic stenosis. She recently has been having chest pain which she describes as sharp pains often related to food and she thinks it could potentially be indigestion. She also harvest been having shortness of breath with exertion. She states with walking with a rapid pace or walking up an incline she becomes short of breath and feels extremely fatigued. She is noted that this has happened with progressively less exertion required to trigger the shortness of breath and fatigue. She denies orthopnea or paroxysmal atrial dyspnea. She denies syncope or presyncope.  Past Medical History   Diagnosis  Date   .  Hypertension    .  Anemia    .  Iron deficiency anemia, unspecified    .  Type II or unspecified type diabetes mellitus without mention of complication, not stated as uncontrolled    .  Neuralgia, neuritis, and radiculitis, unspecified    .  Other and unspecified hyperlipidemia    .  Aortic valve disorders    .  Onychia and paronychia of toe    .  Osteoarthrosis, unspecified whether generalized or localized, pelvic region and thigh      mainly in her back and knees   .  Enthesopathy of hip region    .  Esophageal reflux      followed by Dr.Seigal. stabilized with a combination of Nexium and Zantac   .  Knee joint replacement by other means    .  Stress incontinence, female      followed by Dr.Cope   .  Moderate aortic stenosis  01/12/2010     calculated aortic valve area of 0.9cm with a mean gradient of . ECHO-11/11/2010   .  Coronary atherosclerosis of native coronary artery    .  Coronary atherosclerosis of autologous vein bypass graft     Past Surgical History     Procedure  Date   .  Cardiac catheterization  08/2009     50% stenosis distal left main, 50% stenosis ostial left circumflex.   .  Replacement total knee  11/2006     right knee   .  Nasal sinus surgery  2009   .  Total hip arthroplasty  09/2010    Family History   Problem  Relation  Age of Onset   .  Diabetes  Other    .  Heart disease  Sister    .  Heart disease  Brother    .  Heart disease  Brother    .  Heart disease  Brother    Social History  History   Substance Use Topics   .  Smoking status:  Never Smoker   .  Smokeless tobacco:  Never Used   .  Alcohol Use:  No    Current Outpatient Prescriptions   Medication  Sig  Dispense  Refill   .  amLODipine (NORVASC) 5 MG tablet  Take 1 tablet (5 mg total) by mouth daily.  30 tablet  6   .  aspirin 81 MG tablet  Take 81 mg by mouth daily.     Marland Kitchen  atorvastatin (LIPITOR) 40 MG tablet  Take 40 mg by mouth daily.     .  Calcium Carbonate-Vitamin D (CALCIUM 600+D) 600-200 MG-UNIT TABS  Take 1 tablet by mouth 2 (two) times daily.     .  Cholecalciferol (VITAMIN D) 2000 UNITS CAPS  Take 1 capsule by mouth daily.     .  Cyanocobalamin (VITAMIN B-12) 1000 MCG/15ML LIQD  Inject 1,000 mcg as directed every 30 (thirty) days.     .  cycloSPORINE (RESTASIS) 0.05 % ophthalmic emulsion  Place 1 drop into both eyes 2 (two) times daily.     .  Ferrous Sulfate Dried (SLOW RELEASE IRON) 140 (45 FE) MG TBCR  Take 1 tablet by mouth daily.     .  fish oil-omega-3 fatty acids 1000 MG capsule  Take 2 g by mouth 2 (two) times daily.     Marland Kitchen  HYDROcodone-acetaminophen (NORCO) 10-325 MG per tablet  Take 1 tablet by mouth every 6 (six) hours as needed.     Marland Kitchen  ibuprofen (ADVIL,MOTRIN) 800 MG tablet  Take 800 mg by mouth every 8 (eight) hours as needed.     .  metoprolol (TOPROL-XL) 50 MG 24 hr tablet  Take 50 mg by mouth daily.     .  Multiple Vitamin (MULTIVITAMIN) tablet  Take 1 tablet by mouth daily.     Marland Kitchen  oxybutynin (DITROPAN-XL) 10 MG 24 hr tablet  Take 10  mg by mouth daily.     .  pantoprazole (PROTONIX) 40 MG tablet  Take 40 mg by mouth 2 (two) times daily.     .  valsartan (DIOVAN) 160 MG tablet  Take 160 mg by mouth daily.      Allergies   Allergen  Reactions   .  Citalopram      Altered mental status   .  Cymbalta (Duloxetine Hcl)      Caused sedation   .  Imipramine    .  Proton Pump Inhibitors    Review of Systems:  Positive for dry eyes after cataract surgery, weight gain, epigastric and chest pain, shortness of breath with exertion, productive cough, constipation, chronic pain due to arthritis in knees and hips, previous bilateral knee replacement, previous hip replacement, activities Limited due to extremity pain, worsening hearing.  All systems negative  BP 160/83  Pulse 76  Resp 20  Ht 5\' 4"  (1.626 m)  Wt 230 lb (104.327 kg)  BMI 39.48 kg/m2  SpO2 100%  Physical Exam:  Obese 77 year old woman in no acute distress  Neurologic alert and oriented x3, no focal deficits  HEENT is unremarkable , good dentition  Neck supple without thyromegaly, adenopathy, positive bruit versus transmitted heart murmur  Lungs clear bilaterally  Cardiac regular rate and rhythm with a 3/6 crescendo decrescendo murmur at right upper sternal border  Abdomen soft nontender  Extremities no clubbing cyanosis or edema, 2+ radial pulses, 1+ posterior tibial pulses bilaterally  Diagnostic Tests:  Echocardiogram reviewed showed normal left ventricular function with severe aortic stenosis with a valve area of 0.7 cm    Cardiac catheterization 50% eccentric left main stenosis  Impression:  [name] is is a 77 year old woman with severe symptomatic aortic stenosis. She needs aortic valve replacement given her limited life expectancy with this severity of disease. She also has a 50% left main coronary stenosis which has been stable over time. In my opinion given that this is a left main stenosis at the would be best to bypass her LAD and circumflex at  the time of her AVR. I don't think the lesion is tight enough to warrant mammary artery, but we could place saphenous vein graft to the LAD and circumflex which would add minimally to operative time an operative risk and could potentially prevent a major MI in the future.  I discussed with the patient the disease process, the natural history, and recommended treatments. Critical aortic stenosis with a valve area of 0.7 cm2 is a definite indication for surgical intervention. There is no medical therapy or other interventional procedure with equivalent results. I discussed in detail with her the nature of the operation, need for general anesthesia, need for cardiopulmonary bypass, suspected hospital stay, overall recovery, and expected outcomes.  We discussed valve options including mechanical and tissue valves and the relative advantages and disadvantages of each. She understands the primary advantage of the tissue valve was to avoid the need for lifelong anticoagulation. At her age tissue valve is the preferred option, and she strongly does not want to be on Coumadin.  I discussed in detail with her the risks and benefits. She understands the risks include but are not limited to death, MI, stroke, DVT, PE, bleeding, possible need for transfusion, infection, and other organ system dysfunction (respiratory renal or GI complications), complete heart block requiring permanent pacemaker placement. She understands and accepts these risks and agrees to proceed.  Plan:  She is having carotid duplex done tomorrow  Tentatively plan for aortic valve replacement and coronary bypass grafting x2 on Tuesday, November 13. She would be admitted on the day of surgery.    Patient seen and reexamined, no interval change. She is aware of the risks and benefits and excellent chance of procedural success. All questions answered.

## 2011-10-17 NOTE — Progress Notes (Signed)
Unable to access PFT's and Doppler results-ordered but no results showing.  OR made aware.//L. Mahealani Sulak,RN

## 2011-10-17 NOTE — Anesthesia Preprocedure Evaluation (Addendum)
Anesthesia Evaluation  Patient identified by MRN, date of birth, ID band Patient awake    Reviewed: Allergy & Precautions, H&P , Patient's Chart, lab work & pertinent test results, reviewed documented beta blocker date and time   Airway Mallampati: II  Neck ROM: Full    Dental  (+) Teeth Intact and Dental Advisory Given   Pulmonary          Cardiovascular hypertension, Pt. on home beta blockers + CAD + Systolic Click Cardiac cath+ Left main stenosis= 50% Left circ stenosis = 50% EF= 55 to 60% Aortic Valve area = 0.9 cm. With mean gradient of .   Neuro/Psych    GI/Hepatic   Endo/Other  Diabetes mellitus-, Type 2  Renal/GU      Musculoskeletal   Abdominal   Peds  Hematology   Anesthesia Other Findings   Reproductive/Obstetrics                         Anesthesia Physical Anesthesia Plan  ASA: III  Anesthesia Plan: General   Post-op Pain Management:    Induction: Intravenous  Airway Management Planned: Oral ETT  Additional Equipment: Arterial line, PA Cath, TEE and CVP  Intra-op Plan:   Post-operative Plan: Post-operative intubation/ventilation  Informed Consent: I have reviewed the patients History and Physical, chart, labs and discussed the procedure including the risks, benefits and alternatives for the proposed anesthesia with the patient or authorized representative who has indicated his/her understanding and acceptance.   Dental advisory given  Plan Discussed with: Anesthesiologist  Anesthesia Plan Comments:         Anesthesia Quick Evaluation

## 2011-10-18 ENCOUNTER — Inpatient Hospital Stay (HOSPITAL_COMMUNITY): Payer: Medicare Other

## 2011-10-18 LAB — POCT I-STAT 3, ART BLOOD GAS (G3+)
Bicarbonate: 21.9 mEq/L (ref 20.0–24.0)
Bicarbonate: 20.9 mEq/L (ref 20.0–24.0)
O2 Saturation: 97 %
O2 Saturation: 95 %
TCO2: 23 mmol/L (ref 0–100)
TCO2: 22 mmol/L (ref 0–100)
pCO2 arterial: 35.2 mmHg (ref 35.0–45.0)
pCO2 arterial: 33 mmHg — ABNORMAL LOW (ref 35.0–45.0)
pH, Arterial: 7.405 — ABNORMAL HIGH (ref 7.350–7.400)
pH, Arterial: 7.412 — ABNORMAL HIGH (ref 7.350–7.400)
pO2, Arterial: 96 mmHg (ref 80.0–100.0)
pO2, Arterial: 75 mmHg — ABNORMAL LOW (ref 80.0–100.0)

## 2011-10-18 LAB — GLUCOSE, CAPILLARY
Glucose-Capillary: 148 mg/dL — ABNORMAL HIGH (ref 70–99)
Glucose-Capillary: 145 mg/dL — ABNORMAL HIGH (ref 70–99)
Glucose-Capillary: 143 mg/dL — ABNORMAL HIGH (ref 70–99)

## 2011-10-18 LAB — CBC
HCT: 27.6 % — ABNORMAL LOW (ref 36.0–46.0)
MCH: 29.5 pg (ref 26.0–34.0)
MCHC: 32.6 g/dL (ref 30.0–36.0)
MCV: 88.9 fL (ref 78.0–100.0)
MCV: 90.2 fL (ref 78.0–100.0)
Platelets: 156 10*3/uL (ref 150–400)
Platelets: 127 10*3/uL — ABNORMAL LOW (ref 150–400)
RDW: 14 % (ref 11.5–15.5)
RDW: 14.5 % (ref 11.5–15.5)
WBC: 14.7 10*3/uL — ABNORMAL HIGH (ref 4.0–10.5)
WBC: 12.1 10*3/uL — ABNORMAL HIGH (ref 4.0–10.5)

## 2011-10-18 LAB — POCT I-STAT, CHEM 8
BUN: 15 mg/dL (ref 6–23)
Calcium, Ion: 1.15 mmol/L (ref 1.12–1.32)
Creatinine, Ser: 0.8 mg/dL (ref 0.50–1.10)
TCO2: 24 mmol/L (ref 0–100)

## 2011-10-18 LAB — BASIC METABOLIC PANEL
BUN: 9 mg/dL (ref 6–23)
CO2: 22 mEq/L (ref 19–32)
Chloride: 110 mEq/L (ref 96–112)
GFR calc non Af Amer: >90 mL/min (ref >90)
Glucose, Bld: 149 mg/dL — ABNORMAL HIGH (ref 70–99)
Potassium: 3.7 mEq/L (ref 3.5–5.1)
Sodium: 143 mEq/L (ref 135–145)

## 2011-10-18 MED ORDER — POTASSIUM CHLORIDE 10 MEQ/50ML IV SOLN
INTRAVENOUS | Status: AC
  Administered 2011-10-18: 10 meq via INTRAVENOUS
  Filled 2011-10-18: qty 50

## 2011-10-18 MED ORDER — SODIUM CHLORIDE 0.9 % IV SOLN: INTRAVENOUS | Status: DC | PRN

## 2011-10-18 MED ORDER — DEXTROSE 5 % IV SOLN
1.5000 g | Freq: Two times a day (BID) | INTRAVENOUS | Status: AC
  Administered 2011-10-18 – 2011-10-19 (×4): 1.5 g via INTRAVENOUS
  Filled 2011-10-18 (×5): qty 1.5

## 2011-10-18 MED ORDER — ONDANSETRON HCL 4 MG/2ML IJ SOLN
INTRAMUSCULAR | Status: AC
  Administered 2011-10-18: 4 mg via INTRAVENOUS
  Filled 2011-10-18: qty 2

## 2011-10-18 MED ORDER — PHENOL 1.4 % MT LIQD
1.0000 | OROMUCOSAL | Status: DC | PRN
  Administered 2011-10-18: 1 via OROMUCOSAL
  Filled 2011-10-18: qty 177

## 2011-10-18 MED ORDER — INSULIN GLARGINE 100 UNIT/ML ~~LOC~~ SOLN: 20.0000 [IU] | SUBCUTANEOUS | Status: DC

## 2011-10-18 MED ORDER — PANTOPRAZOLE SODIUM 40 MG PO TBEC
40.0000 mg | DELAYED_RELEASE_TABLET | Freq: Two times a day (BID) | ORAL | Status: DC
  Administered 2011-10-18 – 2011-10-22 (×8): 40 mg via ORAL
  Filled 2011-10-18 (×8): qty 1

## 2011-10-18 MED ORDER — POTASSIUM CHLORIDE 10 MEQ/50ML IV SOLN: 10.0000 meq | INTRAVENOUS | Status: AC

## 2011-10-18 MED ORDER — FUROSEMIDE 10 MG/ML IJ SOLN
40.0000 mg | Freq: Once | INTRAMUSCULAR | Status: AC
  Administered 2011-10-18: 40 mg via INTRAVENOUS
  Filled 2011-10-18: qty 4

## 2011-10-18 MED ORDER — AMLODIPINE BESYLATE 5 MG PO TABS
5.0000 mg | ORAL_TABLET | Freq: Every day | ORAL | Status: DC
  Administered 2011-10-18 – 2011-10-30 (×11): 5 mg via ORAL
  Filled 2011-10-18 (×13): qty 1

## 2011-10-18 MED ORDER — POTASSIUM CHLORIDE 10 MEQ/50ML IV SOLN
10.0000 meq | INTRAVENOUS | Status: AC | PRN
  Administered 2011-10-18 (×3): 10 meq via INTRAVENOUS
  Filled 2011-10-18: qty 100

## 2011-10-18 MED ORDER — ENOXAPARIN SODIUM 40 MG/0.4ML ~~LOC~~ SOLN
40.0000 mg | Freq: Every day | SUBCUTANEOUS | Status: DC
  Administered 2011-10-18 – 2011-10-20 (×2): 40 mg via SUBCUTANEOUS
  Filled 2011-10-18 (×3): qty 0.4

## 2011-10-18 MED ORDER — INSULIN ASPART 100 UNIT/ML ~~LOC~~ SOLN
0.0000 [IU] | SUBCUTANEOUS | Status: DC
  Administered 2011-10-18 – 2011-10-21 (×11): 2 [IU] via SUBCUTANEOUS

## 2011-10-18 MED FILL — Aminocaproic Acid Inj 250 MG/ML: INTRAVENOUS | Qty: 40 | Status: AC

## 2011-10-18 MED FILL — Nitroglycerin IV Soln 200 MCG/ML in D5W: INTRAVENOUS | Qty: 250 | Status: AC

## 2011-10-18 MED FILL — Cefuroxime Sodium For Inj 750 MG: INTRAMUSCULAR | Qty: 750 | Status: AC

## 2011-10-18 MED FILL — Potassium Chloride Inj 2 mEq/ML: INTRAVENOUS | Qty: 40 | Status: AC

## 2011-10-18 MED FILL — Heparin Sodium (Porcine) Inj 5000 Unit/ML: INTRAMUSCULAR | Qty: 1 | Status: AC

## 2011-10-18 MED FILL — Dopamine in Dextrose 5% Inj 3.2 MG/ML: INTRAVENOUS | Qty: 250 | Status: AC

## 2011-10-18 MED FILL — Vancomycin HCl For IV Soln 1 GM (Base Equivalent): INTRAVENOUS | Qty: 1500 | Status: AC

## 2011-10-18 MED FILL — Epinephrine HCl Inj 1 MG/ML: INTRAMUSCULAR | Qty: 4 | Status: AC

## 2011-10-18 MED FILL — Insulin Regular (Human) Inj 100 Unit/ML: INTRAMUSCULAR | Qty: 10 | Status: AC

## 2011-10-18 MED FILL — Phenylephrine HCl Inj 10 MG/ML: INTRAMUSCULAR | Qty: 2 | Status: AC

## 2011-10-18 MED FILL — Dexmedetomidine HCl IV Soln 200 MCG/2ML: INTRAVENOUS | Qty: 4 | Status: AC

## 2011-10-18 MED FILL — Magnesium Sulfate Inj 50%: INTRAMUSCULAR | Qty: 2 | Status: AC

## 2011-10-18 MED FILL — Cefuroxime Sodium For Inj 1.5 GM: INTRAMUSCULAR | Qty: 1.5 | Status: AC

## 2011-10-18 MED FILL — Papaverine HCl Inj 30 MG/ML: INTRAMUSCULAR | Qty: 2 | Status: AC

## 2011-10-18 NOTE — Progress Notes (Signed)
1 Day Post-Op Procedure(s) (LRB): AORTIC VALVE REPLACEMENT (AVR) (N/A) CORONARY ARTERY BYPASS GRAFTING (CABG) (N/A) Subjective: C/o incisional pain. Received oxycodone a few minutes ago. Also c/o acid reflux  Objective: Vital signs in last 24 hours: Temp:  [94.6 F (34.8 C)-100.2 F (37.9 C)] 99.5 F (37.5 C) (11/14 0900) Pulse Rate:  [77-96] 87  (11/14 0900) Cardiac Rhythm:  [-] A-V Sequential paced (11/14 0900) Resp:  [12-34] 21  (11/14 0900) BP: (76-121)/(42-84) 121/47 mmHg (11/14 0900) SpO2:  [99 %-100 %] 100 % (11/14 0900) FiO2 (%):  [39.6 %-50.9 %] 40.1 % (11/14 0045) Weight:  [110.6 kg (243 lb 13.3 oz)] 243 lb 13.3 oz (110.6 kg) (11/14 0545)  Hemodynamic parameters for last 24 hours: PAP: (21-42)/(12-24) 42/22 mmHg CO:  [2.9 L/min-5.5 L/min] 5.5 L/min CI:  [1.4 L/min/m2-2.6 L/min/m2] 2.6 L/min/m2  Intake/Output from previous day: 11/13 0701 - 11/14 0700 In: 6458.5 [I.V.:4564.5; Blood:384; IV Piggyback:1510] Out: 6288 [Urine:5110; Blood:950; Chest Tube:228] Intake/Output this shift: Total I/O In: 190 [I.V.:40; IV Piggyback:150] Out: 110 [Urine:80; Chest Tube:30]  General appearance: alert and no distress Neurologic: intact Heart: regular rate and rhythm Lungs: diminished breath sounds bilaterally Abdomen: normal findings: soft, non-tender  Lab Results:  Basename 10/18/11 0430 10/17/11 2148 10/17/11 2030  WBC 14.7* -- 13.6*  HGB 9.0* 9.5* --  HCT 27.1* 28.0* --  PLT 156 -- 136*   BMET:  Basename 10/18/11 0430 10/17/11 2148 10/16/11 0959  NA 143 142 --  K 3.7 3.7 --  CL 110 108 --  CO2 22 -- 24  GLUCOSE 149* 162* --  BUN 9 8 --  CREATININE 0.49* 0.30* --  CALCIUM 8.4 -- 10.2    PT/INR:  Basename 10/17/11 1440  LABPROT 18.6*  INR 1.52*   ABG    Component Value Date/Time   PHART 7.412* 10/18/2011 0459   HCO3 20.9 10/18/2011 0459   TCO2 22 10/18/2011 0459   ACIDBASEDEF 3.0* 10/18/2011 0459   O2SAT 95.0 10/18/2011 0459   CBG (last 3)    Basename 10/18/11 0722 10/18/11 0237 10/18/11 0017  GLUCAP 148* 170* 157*    Assessment/Plan: S/P Procedure(s) (LRB): AORTIC VALVE REPLACEMENT (AVR) (N/A) CORONARY ARTERY BYPASS GRAFTING (CABG) (N/A) Mobilize Diuresis d/c tubes/lines See progression orders  Cardiac: POD 1 AVR/CABG - in heart block, was in SR yesterday, d/c lopressor, restart amlodipine  D/ c swan Resp: pulm toilet Renal: Lytes, creatinine OK, diurese GI- Protonix- pt taking at home   LOS: 1 day    Anagha Loseke C 10/18/2011

## 2011-10-18 NOTE — Progress Notes (Signed)
                   [address]            [place] [address]          [phone]      Patient examined and record reviewed.Hemodynamics stable,labs satisfactory.Patient had stable day.Continue current care.A-V pacing w/ good threshold to capture. VAN TRIGT III,Draxton Luu 10/18/2011

## 2011-10-18 NOTE — Procedures (Signed)
Extubation Procedure Note  Patient Details:   Name: NEOSHA SWITALSKI DOB: Jun 30, 1931 MRN: [id number]   Airway Documentation:     Patient extubated to 4 lpm nasal cannula  Evaluation  O2 sats: stable throughout Complications: No apparent complications Patient did tolerate procedure well. Bilateral Breath Sounds: Diminished;Clear   Yes, patient able to vocalize.  Patina Spanier, Aloha Gell 10/18/2011, 1:28 AM

## 2011-10-18 NOTE — Clinical Documentation Improvement (Signed)
GENERIC DOCUMENTATION CLARIFICATION QUERY  Please document your response to this query in the progress notes and discharge summary                                                                                         10/18/11    Dr. Dorris Fetch and/or Associates,  In a better effort to capture your patient's severity of illness, reflect appropriate length of stay and utilization of resources, a review of the patient medical record has revealed the following indicators:  "in Heart Block , was in SR yesterday"  Cardiac Surgery progress note 10/18/11  !2 lead EKG 10/18/11 - Complete Heart Block  Please document the TYPE of HEART BLOCK in the progress notes and discharge summary:   - 3rd degree/Complete Heart Block   - 2nd degree   - Other type of Heart Block   - Unable to Clinically Determine   Reviewed:  Complete heart block documented progress note 10/19/11 by hendrickson  In responding to this query please exercise your independent judgment.  The fact that a query is asked, does not imply that any particular answer is desired or expected.  Thank You,  Jerral Ralph RN BSN Certified Clinical Documentation Specialist: Cell - [phone]  [facility] Information [facility] [place]

## 2011-10-18 NOTE — Progress Notes (Signed)
UR Completed.   Lurine Imel Jane 10/18/2011  

## 2011-10-19 ENCOUNTER — Inpatient Hospital Stay (HOSPITAL_COMMUNITY): Payer: Medicare Other

## 2011-10-19 ENCOUNTER — Encounter (HOSPITAL_COMMUNITY): Payer: Self-pay | Admitting: Thoracic Surgery (Cardiothoracic Vascular Surgery)

## 2011-10-19 LAB — GLUCOSE, CAPILLARY
Glucose-Capillary: 103 mg/dL — ABNORMAL HIGH (ref 70–99)
Glucose-Capillary: 121 mg/dL — ABNORMAL HIGH (ref 70–99)
Glucose-Capillary: 133 mg/dL — ABNORMAL HIGH (ref 70–99)

## 2011-10-19 MED ORDER — HYDROCODONE-ACETAMINOPHEN 10-325 MG PO TABS
1.0000 | ORAL_TABLET | ORAL | Status: DC | PRN
  Administered 2011-10-19 – 2011-10-20 (×2): 1 via ORAL
  Filled 2011-10-19: qty 1

## 2011-10-19 MED ORDER — POTASSIUM CHLORIDE CRYS ER 20 MEQ PO TBCR
20.0000 meq | EXTENDED_RELEASE_TABLET | Freq: Two times a day (BID) | ORAL | Status: DC
  Administered 2011-10-19 – 2011-10-22 (×5): 20 meq via ORAL
  Filled 2011-10-19 (×9): qty 1

## 2011-10-19 MED ORDER — HYDROCODONE-ACETAMINOPHEN 10-325 MG PO TABS
2.0000 | ORAL_TABLET | ORAL | Status: DC | PRN
  Administered 2011-10-19 – 2011-10-21 (×7): 2 via ORAL
  Administered 2011-10-21 (×2): 1 via ORAL
  Administered 2011-10-22 (×2): 2 via ORAL
  Filled 2011-10-19 (×4): qty 2
  Filled 2011-10-19: qty 1
  Filled 2011-10-19 (×6): qty 2
  Filled 2011-10-19: qty 1
  Filled 2011-10-19: qty 2

## 2011-10-19 MED ORDER — WARFARIN SODIUM 2.5 MG PO TABS
2.5000 mg | ORAL_TABLET | Freq: Every day | ORAL | Status: DC
  Administered 2011-10-19 – 2011-10-23 (×5): 2.5 mg via ORAL
  Filled 2011-10-19 (×6): qty 1

## 2011-10-19 MED ORDER — FUROSEMIDE 40 MG PO TABS
40.0000 mg | ORAL_TABLET | Freq: Every day | ORAL | Status: DC
  Filled 2011-10-19: qty 1

## 2011-10-19 MED ORDER — FUROSEMIDE 10 MG/ML IJ SOLN
40.0000 mg | Freq: Once | INTRAMUSCULAR | Status: AC
  Administered 2011-10-19: 40 mg via INTRAVENOUS
  Filled 2011-10-19: qty 4

## 2011-10-19 MED ORDER — TRAMADOL HCL 50 MG PO TABS
50.0000 mg | ORAL_TABLET | Freq: Four times a day (QID) | ORAL | Status: AC
  Administered 2011-10-20 – 2011-10-22 (×11): 50 mg via ORAL
  Filled 2011-10-19 (×11): qty 1

## 2011-10-19 MED FILL — Heparin Sodium (Porcine) Inj 1000 Unit/ML: INTRAMUSCULAR | Qty: 60 | Status: AC

## 2011-10-19 MED FILL — Sodium Chloride IV Soln 0.9%: INTRAVENOUS | Qty: 1000 | Status: AC

## 2011-10-19 MED FILL — Electrolyte-R (PH 7.4) Solution: INTRAVENOUS | Qty: 5000 | Status: AC

## 2011-10-19 MED FILL — Sodium Chloride Irrigation Soln 0.9%: Qty: 3000 | Status: AC

## 2011-10-19 NOTE — Progress Notes (Signed)
2 Days Post-Op Procedure(s) (LRB): AORTIC VALVE REPLACEMENT (AVR) (N/A) CORONARY ARTERY BYPASS GRAFTING (CABG) (N/A) Subjective: C/o pain, uses hydrocodone at home, requests change to that   Objective: Vital signs in last 24 hours: Temp:  [98.7 F (37.1 C)-100.5 F (38.1 C)] 99.8 F (37.7 C) (11/15 0730) Pulse Rate:  [69-104] 88  (11/15 0800) Cardiac Rhythm:  [-] A-V Sequential paced (11/15 0800) Resp:  [4-27] 17  (11/15 0800) BP: (90-130)/(36-76) 97/60 mmHg (11/15 0800) SpO2:  [90 %-100 %] 98 % (11/15 0800) Weight:  [110.1 kg (242 lb 11.6 oz)] 242 lb 11.6 oz (110.1 kg) (11/15 1610)  Hemodynamic parameters for last 24 hours: PAP: (42-47)/(21-27) 43/21 mmHg  Intake/Output from previous day: 11/14 0701 - 11/15 0700 In: 750 [P.O.:60; I.V.:440; IV Piggyback:250] Out: 1100 [Urine:1040; Chest Tube:60] Intake/Output this shift: Total I/O In: 20 [I.V.:20] Out: 150 [Urine:150]  General appearance: alert, cooperative and no distress Neurologic: intact Heart: regular rate and rhythm Lungs: diminished breath sounds bibasilar  Lab Results:  Basename 10/18/11 1653 10/18/11 1645 10/18/11 0430  WBC -- 12.1* 14.7*  HGB 9.2* 9.0* --  HCT 27.0* 27.6* --  PLT -- 127* 156   BMET:  Basename 10/18/11 1653 10/18/11 1645 10/18/11 0430 10/16/11 0959  NA 142 -- 143 --  K 4.0 -- 3.7 --  CL 105 -- 110 --  CO2 -- -- 22 24  GLUCOSE 122* -- 149* --  BUN 15 -- 9 --  CREATININE 0.80 0.64 -- --  CALCIUM -- -- 8.4 10.2    PT/INR:  Basename 10/17/11 1440  LABPROT 18.6*  INR 1.52*   ABG    Component Value Date/Time   PHART 7.412* 10/18/2011 0459   HCO3 20.9 10/18/2011 0459   TCO2 24 10/18/2011 1653   ACIDBASEDEF 3.0* 10/18/2011 0459   O2SAT 95.0 10/18/2011 0459   CBG (last 3)   Basename 10/19/11 0721 10/19/11 0453 10/19/11 0019  GLUCAP 108* 103* 133*    Assessment/Plan: S/P Procedure(s) (LRB): AORTIC VALVE REPLACEMENT (AVR) (N/A) CORONARY ARTERY BYPASS GRAFTING (CABG)  (N/A) s/p AVR CABG x 2 Currently in Complete heart block, likely will need pacemaker- will seek EP opinion Given mobility issues will treat with coumadin for 6 weeks for prosthetic valve and high risk DVT Volume overload, R pleural effusion, acute on chronic diastolic heart failure- diurese Anemia secondary to ABL- follow Keep in ICU until rhythm issues resolved.   LOS: 2 days    HENDRICKSON,STEVEN C 10/19/2011

## 2011-10-20 ENCOUNTER — Encounter (HOSPITAL_COMMUNITY): Payer: Self-pay | Admitting: Internal Medicine

## 2011-10-20 ENCOUNTER — Encounter (HOSPITAL_COMMUNITY)
Admission: RE | Disposition: A | Discharge status: Skilled Nursing Facility | Payer: Self-pay | Source: Ambulatory Visit | Attending: Thoracic Surgery (Cardiothoracic Vascular Surgery)

## 2011-10-20 ENCOUNTER — Inpatient Hospital Stay (HOSPITAL_COMMUNITY): Payer: Medicare Other

## 2011-10-20 DIAGNOSIS — I442 Atrioventricular block, complete: Secondary | ICD-10-CM | POA: Diagnosis not present

## 2011-10-20 HISTORY — PX: PERMANENT PACEMAKER INSERTION: SHX5480

## 2011-10-20 LAB — CBC
MCH: 29.5 pg (ref 26.0–34.0)
MCHC: 32.1 g/dL (ref 30.0–36.0)
MCV: 91.6 fL (ref 78.0–100.0)
Platelets: 138 10*3/uL — ABNORMAL LOW (ref 150–400)
RBC: 2.75 MIL/uL — ABNORMAL LOW (ref 3.87–5.11)
RDW: 14.7 % (ref 11.5–15.5)

## 2011-10-20 LAB — GLUCOSE, CAPILLARY
Glucose-Capillary: 110 mg/dL — ABNORMAL HIGH (ref 70–99)
Glucose-Capillary: 134 mg/dL — ABNORMAL HIGH (ref 70–99)
Glucose-Capillary: 99 mg/dL (ref 70–99)
Glucose-Capillary: 93 mg/dL (ref 70–99)

## 2011-10-20 LAB — BASIC METABOLIC PANEL
CO2: 26 mEq/L (ref 19–32)
Calcium: 8.4 mg/dL (ref 8.4–10.5)
Creatinine, Ser: 0.5 mg/dL (ref 0.50–1.10)
GFR calc non Af Amer: >90 mL/min (ref >90)
Glucose, Bld: 111 mg/dL — ABNORMAL HIGH (ref 70–99)
Sodium: 137 mEq/L (ref 135–145)

## 2011-10-20 LAB — PROTIME-INR: Prothrombin Time: 15.9 seconds — ABNORMAL HIGH (ref 11.6–15.2)

## 2011-10-20 SURGERY — PERMANENT PACEMAKER INSERTION
Anesthesia: LOCAL

## 2011-10-20 MED ORDER — SODIUM CHLORIDE 0.9 % IR SOLN
Status: DC
  Filled 2011-10-20: qty 2

## 2011-10-20 MED ORDER — SODIUM CHLORIDE 0.9 % IR SOLN
80.0000 mg | Status: DC
  Filled 2011-10-20: qty 2

## 2011-10-20 MED ORDER — SODIUM CHLORIDE 0.9 % IV SOLN: INTRAVENOUS | Status: DC

## 2011-10-20 MED ORDER — ACETAMINOPHEN 325 MG PO TABS: 325.0000 mg | ORAL_TABLET | ORAL | Status: DC | PRN

## 2011-10-20 MED ORDER — SODIUM CHLORIDE 0.45 % IV SOLN: INTRAVENOUS | Status: DC

## 2011-10-20 MED ORDER — SODIUM CHLORIDE 0.9 % IJ SOLN: 3.0000 mL | INTRAMUSCULAR | Status: DC | PRN

## 2011-10-20 MED ORDER — ACETAMINOPHEN 500 MG PO TABS: 1000.0000 mg | ORAL_TABLET | Freq: Four times a day (QID) | ORAL | Status: AC

## 2011-10-20 MED ORDER — DEXTROSE 5 % IV SOLN
1.5000 g | Freq: Once | INTRAVENOUS | Status: DC
  Filled 2011-10-20: qty 15

## 2011-10-20 MED ORDER — SODIUM CHLORIDE 0.9 % IJ SOLN
3.0000 mL | Freq: Two times a day (BID) | INTRAMUSCULAR | Status: DC
  Administered 2011-10-20 – 2011-10-22 (×4): 3 mL via INTRAVENOUS

## 2011-10-20 MED ORDER — FUROSEMIDE 10 MG/ML IJ SOLN
INTRAMUSCULAR | Status: AC
  Filled 2011-10-20: qty 4

## 2011-10-20 MED ORDER — FENTANYL CITRATE 0.05 MG/ML IJ SOLN
INTRAMUSCULAR | Status: AC
  Filled 2011-10-20: qty 2

## 2011-10-20 MED ORDER — ONDANSETRON HCL 4 MG/2ML IJ SOLN: 4.0000 mg | Freq: Four times a day (QID) | INTRAMUSCULAR | Status: DC | PRN

## 2011-10-20 MED ORDER — CEFAZOLIN SODIUM-DEXTROSE 2-3 GM-% IV SOLR
2.0000 g | INTRAVENOUS | Status: DC
  Filled 2011-10-20: qty 50

## 2011-10-20 MED ORDER — MIDAZOLAM HCL 5 MG/5ML IJ SOLN
INTRAMUSCULAR | Status: AC
  Filled 2011-10-20: qty 5

## 2011-10-20 MED ORDER — SODIUM CHLORIDE 0.9 % IR SOLN: Status: DC

## 2011-10-20 MED ORDER — WARFARIN VIDEO
Freq: Once | Status: DC
  Filled 2011-10-20: qty 1

## 2011-10-20 MED ORDER — LIDOCAINE HCL (PF) 1 % IJ SOLN
INTRAMUSCULAR | Status: AC
  Filled 2011-10-20: qty 60

## 2011-10-20 MED ORDER — FUROSEMIDE 10 MG/ML IJ SOLN
INTRAMUSCULAR | Status: AC
  Administered 2011-10-20: 10 mg
  Filled 2011-10-20: qty 4

## 2011-10-20 MED ORDER — COUMADIN BOOK
Freq: Once | Status: DC
  Filled 2011-10-20: qty 1

## 2011-10-20 MED ORDER — FUROSEMIDE 10 MG/ML IJ SOLN
40.0000 mg | Freq: Two times a day (BID) | INTRAMUSCULAR | Status: DC
  Administered 2011-10-20 – 2011-10-22 (×4): 40 mg via INTRAVENOUS
  Filled 2011-10-20 (×7): qty 4

## 2011-10-20 MED ORDER — HYDROCODONE-ACETAMINOPHEN 5-325 MG PO TABS
1.0000 | ORAL_TABLET | ORAL | Status: DC | PRN
  Administered 2011-10-21: 1 via ORAL
  Filled 2011-10-20: qty 1

## 2011-10-20 MED ORDER — HEPARIN (PORCINE) IN NACL 2-0.9 UNIT/ML-% IJ SOLN
INTRAMUSCULAR | Status: AC
  Filled 2011-10-20: qty 1000

## 2011-10-20 NOTE — Op Note (Signed)
   SURGEON:  Hillis Range, MD      PREPROCEDURE DIAGNOSIS:  Complete heart block    POSTPROCEDURE DIAGNOSIS:  Complete heart block      PROCEDURES:   1.Pacemaker implantation.      INTRODUCTION: Michele Schultz is a 77 y.o. female  with a  history of severe AS and CAD s/p AVR with CABG.  She has persistent complete heart block postoperatively.  She has not had return of conduction and remains pacer dependant at this time.  She presents today for pacemaker implantation.      DESCRIPTION OF PROCEDURE:  Informed written consent was obtained, and the patient was brought to the electrophysiology lab in a fasting state.  The patient was adequately sedated with versed and fentanyl as outlined in the nursing report.  The patients left chest was  prepped and draped in the usual sterile fashion by the EP lab staff.   The skin overlying the left deltopectoral region was infiltrated with lidocaine for local analgesia.  A 4-cm incision was made over the left  deltopectoral region.  A left subcutaneous pacemaker pocket was fashioned using a combination of sharp and blunt dissection.  Electrocautery was required to assure hemostasis.  The left axillary vein was cannulated with fluoroscopic visualization.  No contrast was required for this endeavor.  Through the left axillary vein a [facility] Tendril STS Model [phone]  (serial number [id number]) right atrial lead and [facility] Tendril STS Model [phone]  (serial number [id number]) right ventricular lead were advanced with fluoroscopic visualization into the right atrial appendage and right ventricular apex positions respectively.  Initial atrial lead P- waves measured 1.4 mV with impedance of 347 ohms and a threshold of 1.4 V   at 0.5 msec.  Right ventricular lead R-waves (paced) measured 13.6 mV with an impedance of 623 ohms and a threshold of 0.6 V at 0.5 msec.  Both leads were secured to the pectoralis fascia using #2-0 silk over the suture sleeves.   The leads were then connected to a [employer] Accent DR RF [id number] pacemaker (serial number [id number]) pacemaker.  The pocket was irrigated with copious gentamicin solution.  The pacemaker was then placed into the pocket and secured to the pectoralis fascia with a single 2 silk suture.  The pocket was then closed in 2 layers with 2.0 Vicryl suture for the subcutaneous and subcuticular layers.  Steri- Strips and a sterile dressing were then applied.  There were no early apparent complications.      CONCLUSIONS:   1. Successful implantation of a [employer] Accent RF dual-chamber pacemaker for Complete AV block   2. No early apparent complications.   3. No contrast required for the procedure today.           Fayrene Fearing Cortlynn Hollinsworth 10/20/2011 1:22 PM

## 2011-10-20 NOTE — Progress Notes (Signed)
Day of Surgery Procedure(s) (LRB): PERMANENT PACEMAKER INSERTION (N/A)                     [address]            [place] [address]          [phone]           Subjective sedated post PPM    Objective: Vital signs in last 24 hours: Temp:  [97.8 F (36.6 C)-99.6 F (37.6 C)] 97.8 F (36.6 C) (11/16 1400) Pulse Rate:  [79-112] 108  (11/16 1415) Cardiac Rhythm:  [-] A-V Sequential paced (11/16 0800) Resp:  [14-25] 22  (11/16 1415) BP: (93-154)/(25-86) 134/59 mmHg (11/16 1415) SpO2:  [97 %-100 %] 99 % (11/16 1415) Weight:  [243 lb 6.2 oz (110.4 kg)] 243 lb 6.2 oz (110.4 kg) (11/16 0600)  Hemodynamic parameters for last 24 hours:  paced rythm Intake/Output from previous day: 11/15 0701 - 11/16 0700 In: 800 [P.O.:440; I.V.:360] Out: 1045 [Urine:1045] Intake/Output this shift: Total I/O In: 60 [I.V.:60] Out: 300 [Urine:300]  Responsive,edematous  Lab Results:  Basename 10/20/11 0400 10/18/11 1653 10/18/11 1645  WBC 11.3* -- 12.1*  HGB 8.1* 9.2* --  HCT 25.2* 27.0* --  PLT 138* -- 127*   BMET:  Basename 10/20/11 0400 10/18/11 1653 10/18/11 0430  NA 137 142 --  K 4.0 4.0 --  CL 104 105 --  CO2 26 -- 22  GLUCOSE 111* 122* --  BUN 14 15 --  CREATININE 0.50 0.80 --  CALCIUM 8.4 -- 8.4    PT/INR:  Basename 10/20/11 0400  LABPROT 15.9*  INR 1.24   ABG    Component Value Date/Time   PHART 7.412* 10/18/2011 0459   HCO3 20.9 10/18/2011 0459   TCO2 24 10/18/2011 1653   ACIDBASEDEF 3.0* 10/18/2011 0459   O2SAT 95.0 10/18/2011 0459   CBG (last 3)   Basename 10/20/11 0714 10/20/11 0412 10/20/11 0021  GLUCAP 113* 110* 108*    Assessment/Plan: S/P Procedure(s) (LRB): PERMANENT PACEMAKER INSERTION (N/A) Diuresis, keep in ICU post PPM  LOS: 3 days    VAN TRIGT III,Frenchie Pribyl 10/20/2011

## 2011-10-20 NOTE — Consult Note (Signed)
ELECTROPHYSIOLOGY CONSULT NOTE  Primary Care Physician: Daniel Nones, [place] Referring Physician:  Dr Dorris Fetch  Admit Date: 10/17/2011  Reason for consultation:  AV block  Michele Schultz is a pleasant 77 y.o. patient with a h/o aortic stenosis and CAD who is presently recovering s/p aortic valve replacement and CABG.  She has a h/o severe AS for which she has been followed by Dr Mariah Milling.  Her symptoms recently worsened and she therefore presented for surgery.  She underwent AVR and CABG 10/17/11.  She has developed AV block post operatively.  Her AV block persists despite watchful waiting.  She remains completely pacer dependant by epicardial wires at this time.  Presently, she is resting comfortable.  She reports incisional site pain.  Today, she denies symptoms of palpitations, shortness of breath, orthopnea, PND, lower extremity edema, dizziness, presyncope, syncope, or neurologic sequela. The patient is tolerating medications without difficulties and is otherwise without complaint today.   Past Medical History  Diagnosis Date  . Hypertension   . Anemia   . Iron deficiency anemia, unspecified   . Neuralgia, neuritis, and radiculitis, unspecified   . Other and unspecified hyperlipidemia   . Aortic valve disorders     s/p AVR for severe AS  . Onychia and paronychia of toe   . Osteoarthrosis, unspecified whether generalized or localized, pelvic region and thigh     mainly in her back and knees  . Enthesopathy of hip region   . Esophageal reflux     followed by Dr.Seigal. stabilized with a combination of Nexium and Zantac  . Knee joint replacement by other means   . Stress incontinence, female     followed by Dr.Cope  . Coronary atherosclerosis of native coronary artery   . Coronary atherosclerosis of autologous vein bypass graft   . Shortness of breath     due to CAD, /w exertion  . Type II or unspecified type diabetes mellitus without mention of complication, not stated as  uncontrolled     pt. reports that she is borderline   . Spinal stenosis    Past Surgical History  Procedure Date  . Replacement total knee 11/2006    right knee  . Nasal sinus surgery 2009  . Total hip arthroplasty 09/2010  . Cardiac catheterization 08/2009    50% stenosis distal left main, 50% stenosis ostial left circumflex.   . Cardiac catheterization     at [facility]  . Eye surgery     IOL/ after cataracts removed- bilateral   . Aortic valve replacement 10/17/2011    Procedure: AORTIC VALVE REPLACEMENT (AVR);  Surgeon: [name], MD;  Location: [facility] OR;  Service: Open Heart Surgery;  Laterality: N/A;  . Coronary artery bypass graft 10/17/2011    Procedure: CORONARY ARTERY BYPASS GRAFTING (CABG);  Surgeon: [name], MD;  Location: [facility] OR;  Service: Open Heart Surgery;  Laterality: N/A;  Times Two, using right leg greater saphenous vein harvested endoscopically    Current Facility-Administered Medications  Medication Dose Route Frequency Provider Last Rate Last Dose  . 0.45 % sodium chloride infusion   Intravenous Continuous Ardelle Balls, PA 20 mL/hr at 10/19/11 [phone]    . 0.9 %  sodium chloride infusion   Intravenous Continuous Ardelle Balls, PA      . 0.9 %  sodium chloride infusion  250 mL Intravenous Continuous Donielle Margaretann Loveless, PA      . acetaminophen (TYLENOL) tablet 1,000 mg  1,000 mg Oral Q6H Salvatore Decent  Dorris Fetch, MD   1,000 mg at 10/19/11 1300   Or  . acetaminophen (TYLENOL) solution 975 mg  975 mg Per Tube Q6H [name], MD   975 mg at 10/18/11 0046  . albumin human 5 % solution 12.5 g  12.5 g Intravenous Once [name], MD      . albumin human 5 % solution 250 mL  250 mL Intravenous Q15 min PRN Ardelle Balls, PA   250 mL at 10/17/11 1702  . albuterol (PROVENTIL) (5 MG/ML) 0.5% nebulizer solution 2.5 mg  2.5 mg Nebulization Q6H PRN [name], MD   2.5 mg at 10/17/11 1445  . amLODipine (NORVASC) tablet  5 mg  5 mg Oral Daily [name], MD   5 mg at 10/18/11 1200  . aspirin EC tablet 325 mg  325 mg Oral Daily Ardelle Balls, [place]   325 mg at 10/19/11 1035   Or  . aspirin chewable tablet 324 mg  324 mg Per Tube Daily Donielle Margaretann Loveless, PA      . bisacodyl (DULCOLAX) EC tablet 10 mg  10 mg Oral Daily Ardelle Balls, PA   10 mg at 10/19/11 1036   Or  . bisacodyl (DULCOLAX) suppository 10 mg  10 mg Rectal Daily Donielle Margaretann Loveless, PA      . cefUROXime (ZINACEF) 1.5 g in dextrose 5 % 50 mL IVPB  1.5 g Intravenous Q12H Purcell Nails, MD   1.5 g at 10/19/11 1034  . coumadin book   Does not apply Once [facility], PHARMD      . cycloSPORINE (RESTASIS) 0.05 % ophthalmic emulsion 1 drop  1 drop Both Eyes BID Ardelle Balls, PA   1 drop at 10/20/11 0008  . docusate sodium (COLACE) capsule 200 mg  200 mg Oral Daily Ardelle Balls, PA   200 mg at 10/19/11 1034  . ferrous sulfate tablet 325 mg  325 mg Oral Q breakfast [name], MD   325 mg at 10/19/11 0745  . furosemide (LASIX) tablet 40 mg  40 mg Oral Daily [name], MD      . HYDROcodone-acetaminophen [facility]) 10-325 MG per tablet 1 tablet  1 tablet Oral Q4H PRN [name], MD   1 tablet at 10/19/11 0848  . HYDROcodone-acetaminophen (NORCO) 10-325 MG per tablet 2 tablet  2 tablet Oral Q4H PRN [name], MD   2 tablet at 10/20/11 0559  . insulin aspart (novoLOG) injection 0-24 Units  0-24 Units Subcutaneous Q4H [name], MD   2 Units at 10/19/11 2017  . insulin glargine (LANTUS) injection 20 Units  20 Units Subcutaneous QAM [name], MD   20 Units at 10/20/11 0636  . insulin regular (HUMULIN R,NOVOLIN R) 1 Units/mL in sodium chloride 0.9 % 100 mL infusion   Intravenous Continuous PRN [name], MD      . lactated ringers infusion   Intravenous Continuous Ardelle Balls, PA 20 mL/hr at 10/18/11 0600    . midazolam (VERSED)  injection 2 mg  2 mg Intravenous Q1H PRN Ardelle Balls, PA      . morphine 4 MG/ML injection 2-5 mg  2-5 mg Intravenous Q1H PRN Ardelle Balls, PA   4 mg at 10/20/11 0016  . multivitamins ther. w/minerals tablet 1 tablet  1 tablet Oral Daily [name], MD   1 tablet at 10/19/11 1000  . nitroGLYCERIN 0.2  mg/mL in dextrose 5 % infusion  0-100 mcg/min Intravenous Continuous Ardelle Balls, PA   10 mcg/min at 10/18/11 0300  . ondansetron (ZOFRAN) injection 4 mg  4 mg Intravenous Q6H PRN Ardelle Balls, PA   4 mg at 10/18/11 0615  . oxybutynin (DITROPAN-XL) 24 hr tablet 10 mg  10 mg Oral Daily Ardelle Balls, PA   10 mg at 10/19/11 1038  . pantoprazole (PROTONIX) EC tablet 40 mg  40 mg Oral BID AC [name], MD   40 mg at 10/20/11 0758  . phenol (CHLORASEPTIC) mouth spray 1 spray  1 spray Mouth/Throat PRN [name], MD   1 spray at 10/18/11 1431  . phenylephrine (NEO-SYNEPHRINE) 20,000 mcg in dextrose 5 % 250 mL infusion  0-100 mcg/min Intravenous Continuous Donielle Margaretann Loveless, PA      . potassium chloride SA (K-DUR,KLOR-CON) CR tablet 20 mEq  20 mEq Oral BID [name], MD   20 mEq at 10/20/11 0009  . simvastatin (ZOCOR) tablet 20 mg  20 mg Oral q1800 Ardelle Balls, PA   20 mg at 10/19/11 1823  . sodium chloride 0.9 % injection 10 mL  10 mL Intravenous PRN [name], MD   10 mL at 10/20/11 0031  . sodium chloride 0.9 % injection 3 mL  3 mL Intravenous Q12H Ardelle Balls, PA   3 mL at 10/19/11 1000  . sodium chloride 0.9 % injection 3 mL  3 mL Intravenous PRN Ardelle Balls, PA      . traMADol (ULTRAM) tablet 50 mg  50 mg Oral Q6H Alleen Borne, MD   50 mg at 10/20/11 0636  . warfarin (COUMADIN) tablet 2.5 mg  2.5 mg Oral q1800 [name], MD   2.5 mg at 10/19/11 1824  . warfarin (COUMADIN) video   Does not apply Once [facility], PHARMD      . DISCONTD: enoxaparin (LOVENOX)  injection 40 mg  40 mg Subcutaneous QHS [name], MD   40 mg at 10/20/11 0009    Allergies  Allergen Reactions  . Citalopram     Altered mental status  . Cymbalta (Duloxetine Hcl)     Caused sedation  . Imipramine   . Proton Pump Inhibitors     History   Social History  . Marital Status: Married    Spouse Name: N/A    Number of Children: N/A  . Years of Education: N/A   Occupational History  . Not on file.   Social History Main Topics  . Smoking status: Never Smoker   . Smokeless tobacco: Never Used  . Alcohol Use: No  . Drug Use: No  . Sexually Active:    Other Topics Concern  . Not on file   Social History Narrative  . No narrative on file    Family History  Problem Relation Age of Onset  . Diabetes Other   . Heart disease Sister   . Heart disease Brother   . Heart disease Brother   . Heart disease Brother   . Anesthesia problems Neg Hx   . Hypotension Neg Hx   . Malignant hyperthermia Neg Hx   . Pseudochol deficiency Neg Hx     ROS- All systems are reviewed and negative except as per the HPI above  Physical Exam: Filed Vitals:   10/20/11 0700 10/20/11 0715 10/20/11 0800 10/20/11 0900  BP: 95/49  111/47 107/49  Pulse: 86  89  83  Temp:  99.2 F (37.3 C)    TempSrc:  Oral    Resp: 15  17 24   Weight:      SpO2:   100% 100%    GEN- The patient is overweight appearing, alert and oriented x 3 today.   Head- normocephalic, atraumatic Eyes-  Sclera clear, conjunctiva pink Ears- hearing intact Oropharynx- clear Neck- supple, R CVL in place Lymph- no cervical lymphadenopathy Lungs- Clear to ausculation bilaterally, normal work of breathing Heart- Regular rate and rhythm (paced) GI- soft, NT, ND, + BS Extremities- no clubbing, cyanosis, + dependant edema MS- no significant deformity or atrophy Skin- no rash or lesion Psych- euthymic mood, full affect Neuro- strength and sensation are intact  Labs:   Lab Results  Component Value  Date   WBC 11.3* 10/20/2011   HGB 8.1* 10/20/2011   HCT 25.2* 10/20/2011   MCV 91.6 10/20/2011   PLT 138* 10/20/2011    Lab 10/20/11 0400 10/16/11 0959  NA 137 --  K 4.0 --  CL 104 --  CO2 26 --  BUN 14 --  CREATININE 0.50 --  CALCIUM 8.4 --  PROT -- 7.0  BILITOT -- 0.5  ALKPHOS -- 87  ALT -- 20  AST -- 20  GLUCOSE 111* --    Echo 10/12 reveals normal EF  ASSESSMENT AND PLAN:  The patient has persistent complete heart block s/p AVR.  She remains 100% temporary pacing dependant now 3 days post op.  I would therefore recommend pacemaker implantation at this time.  Risks, benefits, alternatives to pacemaker implantation were discussed in detail with the patient today. The patient understands that the risks include but are not limited to bleeding, infection, pneumothorax, perforation, tamponade, vascular damage, renal failure, MI, stroke, death,  and lead dislodgement and wishes to proceed. We will therefore proceed with the procedure at this time.   Hillis Range, MD 10/20/2011 10:19 AM

## 2011-10-20 NOTE — Progress Notes (Signed)
TCTS BRIEF SICU PROGRESS NOTE  Day of Surgery  S/P Procedure(s) (LRB): PERMANENT PACEMAKER INSERTION (N/A)   Tolerated pacemaker placement well.  Sinus tach with Vpacing.  Assessment/Plan:  Continue routine postop.  Probably can restart beta blocker tomorrow.  Michele Schultz H

## 2011-10-20 NOTE — Progress Notes (Signed)
PT Cancellation Note PT evaluation is cancelled today - patient having pacemaker placed due to heart block.  Will return in am to attempt evaluation. Durenda Hurt Renaldo Fiddler, [facility] Pager [phone]

## 2011-10-20 NOTE — Brief Op Note (Signed)
10/17/2011 - 10/20/2011  1:19 PM  PATIENT:  Ardath Sax  77 y.o. female  PRE-OPERATIVE DIAGNOSIS:  Complete heart block  POST-OPERATIVE DIAGNOSIS:  Complete heart block  PROCEDURE:  Procedure(s): PERMANENT PACEMAKER INSERTION  SURGEON:  Surgeon(s): Gardiner Rhyme, MD   ANESTHESIA:   none  EBL:  Total I/O In: 60 [I.V.:60] Out: -   BLOOD ADMINISTERED:none  DRAINS: none   LOCAL MEDICATIONS USED:  LIDOCAINE 2CC  SPECIMEN:  No Specimen  DISPOSITION OF SPECIMEN:  N/A  COUNTS:  YES  TOURNIQUET:  * No tourniquets in log *  DICTATION: .Note written in EPIC  PLAN OF CARE: return to 2300  PATIENT DISPOSITION:  PACU - hemodynamically stable.   Delay start of Pharmacological VTE agent (>24hrs) due to surgical blood loss or risk of bleeding:  {YES/NO/NOT APPLICABLE:20182

## 2011-10-21 ENCOUNTER — Other Ambulatory Visit: Payer: Self-pay

## 2011-10-21 LAB — CBC
HCT: 28.1 % — ABNORMAL LOW (ref 36.0–46.0)
Hemoglobin: 9 g/dL — ABNORMAL LOW (ref 12.0–15.0)
MCH: 29.2 pg (ref 26.0–34.0)
MCHC: 32 g/dL (ref 30.0–36.0)
MCV: 91.2 fL (ref 78.0–100.0)
Platelets: 191 10*3/uL (ref 150–400)
RBC: 3.08 MIL/uL — ABNORMAL LOW (ref 3.87–5.11)
RDW: 14.5 % (ref 11.5–15.5)
WBC: 9.4 10*3/uL (ref 4.0–10.5)

## 2011-10-21 LAB — BASIC METABOLIC PANEL
BUN: 11 mg/dL (ref 6–23)
CO2: 27 mEq/L (ref 19–32)
Calcium: 8.7 mg/dL (ref 8.4–10.5)
Chloride: 103 mEq/L (ref 96–112)
Creatinine, Ser: 0.47 mg/dL — ABNORMAL LOW (ref 0.50–1.10)
GFR calc Af Amer: >90 mL/min (ref >90)
GFR calc non Af Amer: >90 mL/min (ref >90)
Glucose, Bld: 119 mg/dL — ABNORMAL HIGH (ref 70–99)
Potassium: 3.5 mEq/L (ref 3.5–5.1)
Sodium: 141 mEq/L (ref 135–145)

## 2011-10-21 LAB — GLUCOSE, CAPILLARY
Glucose-Capillary: 124 mg/dL — ABNORMAL HIGH (ref 70–99)
Glucose-Capillary: 83 mg/dL (ref 70–99)

## 2011-10-21 LAB — PROTIME-INR
INR: 1.18 (ref 0.00–1.49)
Prothrombin Time: 15.2 seconds (ref 11.6–15.2)

## 2011-10-21 MED ORDER — METOPROLOL TARTRATE 25 MG PO TABS
25.0000 mg | ORAL_TABLET | Freq: Two times a day (BID) | ORAL | Status: DC
  Administered 2011-10-21 – 2011-10-22 (×3): 25 mg via ORAL
  Filled 2011-10-21 (×4): qty 1

## 2011-10-21 MED ORDER — POTASSIUM CHLORIDE CRYS ER 20 MEQ PO TBCR
40.0000 meq | EXTENDED_RELEASE_TABLET | Freq: Two times a day (BID) | ORAL | Status: DC
  Administered 2011-10-21: 40 meq via ORAL

## 2011-10-21 MED ORDER — INSULIN GLARGINE 100 UNIT/ML ~~LOC~~ SOLN
20.0000 [IU] | SUBCUTANEOUS | Status: DC
  Administered 2011-10-21 – 2011-10-25 (×5): 20 [IU] via SUBCUTANEOUS
  Filled 2011-10-21: qty 3

## 2011-10-21 NOTE — Plan of Care (Signed)
Problem: Phase I Progression Outcomes Goal: OOB as tolerated unless otherwise ordered Outcome: Progressing Patient will need SNF with therapy prior to D/C home to reach Modif I level.  Pt. Lives alone without caregiver support.

## 2011-10-21 NOTE — Progress Notes (Signed)
   CARDIOTHORACIC SURGERY PROGRESS NOTE   R1 Day Post-Op Procedure(s) (LRB): PERMANENT PACEMAKER INSERTION (N/A)  Subjective: Sore in chest. Doesn't like the food. Doesn't want to get up to walk. Denies SOB  Objective: Vital signs: Filed Vitals:   10/21/11 1000  BP: 117/48  Pulse: 116  Temp:   Resp: 21    Hemodynamics:    Physical Exam:  Rhythm:   Sinus tach, V-paced  Breath sounds: diminished  Heart sounds:  distant  Incisions:  Dressings intact  Abdomen:  obese  Extremities:  warm   Intake/Output from previous day: 11/16 0701 - 11/17 0700 In: 660 [P.O.:600; I.V.:60] Out: 1551 [Urine:1551] Intake/Output this shift:    Lab Results:  Basename 10/21/11 0725 10/20/11 0400  WBC 9.4 11.3*  HGB 9.0* 8.1*  HCT 28.1* 25.2*  PLT 191 138*   BMET:  Basename 10/21/11 0725 10/20/11 0400  NA 141 137  K 3.5 4.0  CL 103 104  CO2 27 26  GLUCOSE 119* 111*  BUN 11 14  CREATININE 0.47* 0.50  CALCIUM 8.7 8.4    CBG (last 3)   Basename 10/21/11 0724 10/21/11 0342 10/20/11 2320  GLUCAP 124* 88 93   ABG    Component Value Date/Time   PHART 7.412* 10/18/2011 0459   HCO3 20.9 10/18/2011 0459   TCO2 24 10/18/2011 1653   ACIDBASEDEF 3.0* 10/18/2011 0459   O2SAT 95.0 10/18/2011 0459   INR 1.2  Assessment/Plan: S/P Procedure(s) (LRB): PERMANENT PACEMAKER INSERTION (N/A)  Slow progress s/p AVR + CABG, s/p perm pacer for postop CHB Sinus tach Will restart beta blocker Mobilize coumadin  Kalyan Barabas H

## 2011-10-21 NOTE — Progress Notes (Signed)
Physical Therapy Evaluation Patient Details Name: Michele Schultz MRN: [id number] DOB: 1931/07/09 Today's Date: 10/21/2011  Problem List:  Patient Active Problem List  Diagnoses  . Spinal stenosis  . Fatigue  . CAD (coronary artery disease)  . Aortic valve stenosis  . Hyperlipidemia LDL goal < 70  . HTN (hypertension)  . Complete heart block    Past Medical History:  Past Medical History  Diagnosis Date  . Hypertension   . Anemia   . Iron deficiency anemia, unspecified   . Neuralgia, neuritis, and radiculitis, unspecified   . Other and unspecified hyperlipidemia   . Aortic valve disorders     s/p AVR for severe AS  . Onychia and paronychia of toe   . Osteoarthrosis, unspecified whether generalized or localized, pelvic region and thigh     mainly in her back and knees  . Enthesopathy of hip region   . Esophageal reflux     followed by Dr.Seigal. stabilized with a combination of Nexium and Zantac  . Knee joint replacement by other means   . Stress incontinence, female     followed by Dr.Cope  . Coronary atherosclerosis of native coronary artery   . Coronary atherosclerosis of autologous vein bypass graft   . Shortness of breath     due to CAD, /w exertion  . Type II or unspecified type diabetes mellitus without mention of complication, not stated as uncontrolled     pt. reports that she is borderline   . Spinal stenosis    Past Surgical History:  Past Surgical History  Procedure Date  . Replacement total knee 11/2006    right knee  . Nasal sinus surgery 2009  . Total hip arthroplasty 09/2010  . Cardiac catheterization 08/2009    50% stenosis distal left main, 50% stenosis ostial left circumflex.   . Cardiac catheterization     at [facility]  . Eye surgery     IOL/ after cataracts removed- bilateral   . Aortic valve replacement 10/17/2011    Procedure: AORTIC VALVE REPLACEMENT (AVR);  Surgeon: [name], MD;  Location: [facility] OR;  Service: Open Heart Surgery;   Laterality: N/A;  . Coronary artery bypass graft 10/17/2011    Procedure: CORONARY ARTERY BYPASS GRAFTING (CABG);  Surgeon: [name], MD;  Location: [facility] OR;  Service: Open Heart Surgery;  Laterality: N/A;  Times Two, using right leg greater saphenous vein harvested endoscopically    PT Assessment/Plan/Recommendation PT Assessment Clinical Impression Statement: Patient is s/p complete heart block with pacemaker placement with decr. mobility secondary to pain, decr. endurance and decr. balance.  Will benefit from PT to address mobility issues to facilitate D/C home.  Recommend NHP so pt. can return to prior functional level, PT Recommendation/Assessment: Patient will need skilled PT in the acute care venue PT Problem List: Decreased activity tolerance;Decreased balance;Decreased mobility;Decreased knowledge of use of DME;Decreased cognition;Decreased safety awareness;Decreased knowledge of precautions;Cardiopulmonary status limiting activity;Pain Barriers to Discharge: Decreased caregiver support PT Therapy Diagnosis : Difficulty walking;Acute pain PT Plan PT Frequency: Min 3X/week PT Treatment/Interventions: DME instruction;Gait training;Stair training;Functional mobility training;Therapeutic exercise;Balance training;Cognitive remediation;Patient/family education PT Recommendation Follow Up Recommendations: Skilled nursing facility;24 hour supervision/assistance Equipment Recommended: None recommended by PT PT Goals  Acute Rehab PT Goals PT Goal Formulation: With patient Time For Goal Achievement: 2 weeks Pt will Roll Supine to Left Side: with cues (comment type and amount);with supervision PT Goal: Rolling Supine to Left Side - Progress: Other (comment) Pt will go Supine/Side to Sit:  with min assist;with cues (comment type and amount);with HOB 0 degrees PT Goal: Supine/Side to Sit - Progress: Other (comment) Pt will Transfer Sit to Stand/Stand to Sit: with min assist;with upper  extremity assist PT Transfer Goal: Sit to Stand/Stand to Sit - Progress: Other (comment) Pt will Ambulate: 51 - 150 feet;with min assist;with least restrictive assistive device;with cues (comment type and amount) PT Goal: Ambulate - Progress: Other (comment) Pt will Go Up / Down Stairs: 3-5 stairs;with min assist;with least restrictive assistive device;with cues (comment type and amount) PT Goal: Up/Down Stairs - Progress: Other (comment)  PT Evaluation Precautions/Restrictions  Precautions Precautions: ICD/Pacemaker;Fall Precaution Comments: Sling in place Required Braces or Orthoses: No Prior Functioning  Home Living Lives With: Alone Type of Home: House Home Layout: One level Home Access: Stairs to enter Entrance Stairs-Rails: Can reach both Entrance Stairs-Number of Steps: 3 Bathroom Shower/Tub: Engineer, manufacturing systems: Standard Bathroom Accessibility: Yes How Accessible: Accessible via walker Home Adaptive Equipment: Bedside commode/3-in-1;Shower chair with back;Walker - rolling;Wheelchair - manual Prior Function Level of Independence: Independent with homemaking with ambulation Driving: Yes Vocation: Retired Producer, television/film/video: Awake/alert Overall Cognitive Status: Appears within functional limits for tasks assessed Orientation Level: Oriented X4 Sensation/Coordination Sensation Light Touch: Appears Intact Stereognosis: Not tested Hot/Cold: Not tested Proprioception: Not tested Coordination Gross Motor Movements are Fluid and Coordinated: Yes Fine Motor Movements are Fluid and Coordinated: Yes Extremity Assessment RUE Assessment RUE Assessment: Within Functional Limits LUE Assessment LUE Assessment: Not tested (due to pacemaker precautions.  In sling) RLE Assessment RLE Assessment: Within Functional Limits LLE Assessment LLE Assessment: Within Functional Limits Mobility (including Balance) Bed Mobility Bed Mobility: Yes Rolling  Left: 3: Mod assist Rolling Left Details (indicate cue type and reason): cues for technique Left Sidelying to Sit: 3: Mod assist;HOB flat;With rails Left Sidelying to Sit Details (indicate cue type and reason): cues for technique Sitting - Scoot to Edge of Bed: 4: Min assist Sitting - Scoot to Edge of Bed Details (indicate cue type and reason): cues for reciprocal scooting to decr use of UEs Transfers Transfers: Yes Sit to Stand: 3: Mod assist;From elevated surface;With upper extremity assist;From bed Sit to Stand Details (indicate cue type and reason): Pt. needed cues for hand placement.  USed momentum to get up.   Stand to Sit: 3: Mod assist;To chair/3-in-1;With upper extremity assist;With armrests Stand to Sit Details: needed assist and cues to control descent Stand Pivot Transfers: 1: +2 Total assist (pt.= 75%) Stand Pivot Transfer Details (indicate cue type and reason): Pt. needed handheld assist to stand and pivot to 3n1 and then to recliner.  Pt. taking small steps and almost shuffling feet.  C/o dizziness - states she has hx. of vertigo. Ambulation/Gait Ambulation/Gait: No Stairs: No Wheelchair Mobility Wheelchair Mobility: No  Posture/Postural Control Posture/Postural Control: No significant limitations Balance Balance Assessed: No Exercise    End of Session PT - End of Session Equipment Utilized During Treatment: Gait belt Activity Tolerance: Patient limited by pain Patient left: in chair;with call bell in reach Nurse Communication: Mobility status for transfers General Behavior During Session: [facility] for tasks performed Cognition: Impaired Cognitive Impairment: Minimal delayed processing of information possibly secondary to meds.  Gets distracted easily - had to redirect multiple times during session - again possibly due to meds.  Schultz,Michele Underwood 10/21/2011, 10:26 AM Michele Schultz Acute Rehabilitation [phone] (pager)

## 2011-10-21 NOTE — Progress Notes (Signed)
  Subjective:  C/o incisional soreness over sternotomy. Non-pleuritic.   Objective:  Vital Signs in the last 24 hours: Temp:  [97.8 F (36.6 C)-100.7 F (38.2 C)] 100.7 F (38.2 C) (11/17 0727) Pulse Rate:  [83-113] 95  (11/17 0600) Resp:  [11-25] 14  (11/17 0600) BP: (99-154)/(43-96) 119/52 mmHg (11/17 0600) SpO2:  [96 %-100 %] 99 % (11/17 0600) Weight:  [111.1 kg (244 lb 14.9 oz)] 244 lb 14.9 oz (111.1 kg) (11/17 0200)  Intake/Output from previous day: 11/16 0701 - 11/17 0700 In: 660 [P.O.:600; I.V.:60] Out: 1551 [Urine:1551] Intake/Output from this shift:    Physical Exam: Well appearing obese woman, NAD HEENT: Unremarkable Neck:  No JVD, no thyromegally Lymphatics:  No adenopathy Back:  No CVA tenderness Lungs:  Clear with no wheezes. Well healed PPM incision. HEART:  Regular rate rhythm, no murmurs, no rubs, no clicks Abd:  Flat, positive bowel sounds, no organomegally, no rebound, no guarding Ext:  2 plus pulses, no edema, no cyanosis, no clubbing Skin:  No rashes no nodules Neuro:  CN II through XII intact, motor grossly intact  Lab Results:  Basename 10/21/11 0725 10/20/11 0400  WBC 9.4 11.3*  HGB 9.0* 8.1*  PLT 191 138*    Basename 10/20/11 0400 10/18/11 1653  NA 137 142  K 4.0 4.0  CL 104 105  CO2 26 --  GLUCOSE 111* 122*  BUN 14 15  CREATININE 0.50 0.80   No results found for this basename: TROPONINI:2,CK,MB:2 in the last 72 hours Hepatic Function Panel No results found for this basename: PROT,ALBUMIN,AST,ALT,ALKPHOS,BILITOT,BILIDIR,IBILI in the last 72 hours No results found for this basename: CHOL in the last 72 hours No results found for this basename: PROTIME in the last 72 hours  Imaging: cxr - reviewed  Cardiac Studies: Tele - NSR with CHB/V pacing and RBBB (intermittent) Assessment/Plan:  1. CHB - s/p PPM, with normal function. 2. S/p Aortic valve replacement - as per CVTS 3. Disp - from [facility] perspective, she can go to 2000.  LOS: 4  days    Lewayne Bunting 10/21/2011, 8:19 AM

## 2011-10-22 LAB — GLUCOSE, CAPILLARY
Glucose-Capillary: 101 mg/dL — ABNORMAL HIGH (ref 70–99)
Glucose-Capillary: 106 mg/dL — ABNORMAL HIGH (ref 70–99)
Glucose-Capillary: 133 mg/dL — ABNORMAL HIGH (ref 70–99)

## 2011-10-22 LAB — PROTIME-INR: Prothrombin Time: 15.1 seconds (ref 11.6–15.2)

## 2011-10-22 MED ORDER — OXYCODONE HCL 5 MG PO TABS
5.0000 mg | ORAL_TABLET | ORAL | Status: DC | PRN
  Administered 2011-10-22 – 2011-10-30 (×37): 10 mg via ORAL
  Filled 2011-10-22 (×37): qty 2

## 2011-10-22 MED ORDER — POVIDONE-IODINE 10 % EX SOLN
1.0000 "application " | Freq: Two times a day (BID) | CUTANEOUS | Status: DC
  Administered 2011-10-22 – 2011-10-30 (×16): 1 via TOPICAL
  Filled 2011-10-22: qty 15

## 2011-10-22 MED ORDER — SODIUM CHLORIDE 0.9 % IJ SOLN
3.0000 mL | Freq: Two times a day (BID) | INTRAMUSCULAR | Status: DC
  Administered 2011-10-22 – 2011-10-29 (×14): 3 mL via INTRAVENOUS

## 2011-10-22 MED ORDER — BISACODYL 5 MG PO TBEC: 10.0000 mg | DELAYED_RELEASE_TABLET | Freq: Every day | ORAL | Status: DC | PRN

## 2011-10-22 MED ORDER — PANTOPRAZOLE SODIUM 40 MG PO TBEC
40.0000 mg | DELAYED_RELEASE_TABLET | Freq: Every day | ORAL | Status: DC
  Administered 2011-10-23 – 2011-10-30 (×8): 40 mg via ORAL
  Filled 2011-10-22 (×8): qty 1

## 2011-10-22 MED ORDER — FUROSEMIDE 40 MG PO TABS
40.0000 mg | ORAL_TABLET | Freq: Two times a day (BID) | ORAL | Status: DC
  Administered 2011-10-22 – 2011-10-25 (×6): 40 mg via ORAL
  Filled 2011-10-22 (×9): qty 1

## 2011-10-22 MED ORDER — ONDANSETRON HCL 4 MG PO TABS
4.0000 mg | ORAL_TABLET | Freq: Four times a day (QID) | ORAL | Status: DC | PRN
  Administered 2011-10-26 – 2011-10-29 (×2): 4 mg via ORAL
  Filled 2011-10-22 (×2): qty 1

## 2011-10-22 MED ORDER — DOCUSATE SODIUM 100 MG PO CAPS
200.0000 mg | ORAL_CAPSULE | Freq: Every day | ORAL | Status: DC
  Administered 2011-10-23 – 2011-10-30 (×6): 200 mg via ORAL
  Filled 2011-10-22 (×8): qty 2

## 2011-10-22 MED ORDER — SODIUM CHLORIDE 0.9 % IV SOLN
250.0000 mL | INTRAVENOUS | Status: DC
  Administered 2011-10-22: 250 mL via INTRAVENOUS

## 2011-10-22 MED ORDER — ACETAMINOPHEN 325 MG PO TABS: 650.0000 mg | ORAL_TABLET | Freq: Four times a day (QID) | ORAL | Status: DC | PRN

## 2011-10-22 MED ORDER — INSULIN ASPART 100 UNIT/ML ~~LOC~~ SOLN
0.0000 [IU] | Freq: Three times a day (TID) | SUBCUTANEOUS | Status: DC
  Administered 2011-10-22 – 2011-10-30 (×8): 2 [IU] via SUBCUTANEOUS

## 2011-10-22 MED ORDER — MAGNESIUM HYDROXIDE 400 MG/5ML PO SUSP
30.0000 mL | Freq: Every day | ORAL | Status: DC | PRN
  Filled 2011-10-22: qty 30

## 2011-10-22 MED ORDER — MOVING RIGHT ALONG BOOK
Freq: Once | Status: AC
  Administered 2011-10-22: 23:00:00
  Filled 2011-10-22 (×2): qty 1

## 2011-10-22 MED ORDER — TRAMADOL HCL 50 MG PO TABS
50.0000 mg | ORAL_TABLET | ORAL | Status: DC | PRN
  Administered 2011-10-24: 100 mg via ORAL
  Filled 2011-10-22: qty 2

## 2011-10-22 MED ORDER — SODIUM CHLORIDE 0.9 % IJ SOLN: 3.0000 mL | INTRAMUSCULAR | Status: DC | PRN

## 2011-10-22 MED ORDER — BISACODYL 10 MG RE SUPP
10.0000 mg | Freq: Every day | RECTAL | Status: DC | PRN
  Filled 2011-10-22: qty 1

## 2011-10-22 MED ORDER — ONDANSETRON HCL 4 MG/2ML IJ SOLN
4.0000 mg | Freq: Four times a day (QID) | INTRAMUSCULAR | Status: DC | PRN
  Administered 2011-10-23: 4 mg via INTRAVENOUS
  Filled 2011-10-22: qty 2

## 2011-10-22 MED ORDER — POTASSIUM CHLORIDE CRYS ER 20 MEQ PO TBCR
20.0000 meq | EXTENDED_RELEASE_TABLET | Freq: Two times a day (BID) | ORAL | Status: DC
  Administered 2011-10-22 – 2011-10-24 (×5): 20 meq via ORAL
  Filled 2011-10-22 (×7): qty 1

## 2011-10-22 NOTE — Plan of Care (Signed)
Problem: Problem: Mobility Progression Goal: ABLE TO AMBULATE INDEPENDENTLY Outcome: Not Progressing Pt ambulates very short distances with moderate to max supervision

## 2011-10-22 NOTE — Plan of Care (Signed)
Problem: Phase II Progression Outcomes Goal: Pain controlled with appropriate interventions Outcome: Progressing Pt still complains of frequent pain with any activity

## 2011-10-22 NOTE — Progress Notes (Signed)
   CARDIOTHORACIC SURGERY PROGRESS NOTE   R2 Days Post-Op Procedure(s) (LRB): PERMANENT PACEMAKER INSERTION (N/A)  Subjective: Feels okay. Still extremely limited mobility and complains of continued pain.  Objective: Vital signs: Filed Vitals:   10/22/11 1200  BP: 127/39  Pulse: 84  Temp:   Resp: 14    Hemodynamics:    Physical Exam:  Rhythm:   sinus  Breath sounds: clear  Heart sounds:  distant  Incisions:  dry  Abdomen:  soft  Extremities:  warm   Intake/Output from previous day: 11/17 0701 - 11/18 0700 In: 500 [P.O.:500] Out: 1450 [Urine:1450] Intake/Output this shift: Total I/O In: 600 [P.O.:600] Out: 750 [Urine:750]  Lab Results:  Basename 10/21/11 0725 10/20/11 0400  WBC 9.4 11.3*  HGB 9.0* 8.1*  HCT 28.1* 25.2*  PLT 191 138*   BMET:  Basename 10/21/11 0725 10/20/11 0400  NA 141 137  K 3.5 4.0  CL 103 104  CO2 27 26  GLUCOSE 119* 111*  BUN 11 14  CREATININE 0.47* 0.50  CALCIUM 8.7 8.4    CBG (last 3)   Basename 10/22/11 1116 10/22/11 0730 10/22/11 0336  GLUCAP 106* 101* 99   ABG    Component Value Date/Time   PHART 7.412* 10/18/2011 0459   HCO3 20.9 10/18/2011 0459   TCO2 24 10/18/2011 1653   ACIDBASEDEF 3.0* 10/18/2011 0459   O2SAT 95.0 10/18/2011 0459   INR 1.2  Assessment/Plan: S/P Procedure(s) (LRB): PERMANENT PACEMAKER INSERTION (N/A)  Stable s/p AVR/CABG  Mobility is the biggest issue at this point  Transfer step down PT consult Continue coumadin 2.5 daily Mobilize as much as possible    Pavneet Markwood H

## 2011-10-23 ENCOUNTER — Inpatient Hospital Stay (HOSPITAL_COMMUNITY): Payer: Medicare Other

## 2011-10-23 LAB — GLUCOSE, CAPILLARY
Glucose-Capillary: 97 mg/dL (ref 70–99)
Glucose-Capillary: 111 mg/dL — ABNORMAL HIGH (ref 70–99)

## 2011-10-23 LAB — BASIC METABOLIC PANEL
Calcium: 9.1 mg/dL (ref 8.4–10.5)
GFR calc Af Amer: >90 mL/min (ref >90)
GFR calc non Af Amer: 90 mL/min — ABNORMAL LOW (ref >90)
Sodium: 142 mEq/L (ref 135–145)

## 2011-10-23 LAB — CBC
MCH: 29.3 pg (ref 26.0–34.0)
MCHC: 32.2 g/dL (ref 30.0–36.0)
Platelets: 255 10*3/uL (ref 150–400)
RDW: 14.3 % (ref 11.5–15.5)

## 2011-10-23 LAB — PROTIME-INR
INR: 1.19 (ref 0.00–1.49)
Prothrombin Time: 15.4 seconds — ABNORMAL HIGH (ref 11.6–15.2)

## 2011-10-23 MED ORDER — METOPROLOL SUCCINATE ER 50 MG PO TB24
50.0000 mg | ORAL_TABLET | Freq: Every day | ORAL | Status: DC
  Administered 2011-10-23 – 2011-10-30 (×8): 50 mg via ORAL
  Filled 2011-10-23 (×8): qty 1

## 2011-10-23 MED ORDER — ONE-DAILY MULTI VITAMINS PO TABS: 1.0000 | ORAL_TABLET | Freq: Every day | ORAL | Status: DC

## 2011-10-23 MED ORDER — FERROUS SULFATE DRIED ER 140 (45 FE) MG PO TBCR: 1.0000 | EXTENDED_RELEASE_TABLET | Freq: Every day | ORAL | Status: DC

## 2011-10-23 MED ORDER — OLMESARTAN MEDOXOMIL 20 MG PO TABS
20.0000 mg | ORAL_TABLET | Freq: Every day | ORAL | Status: DC
  Administered 2011-10-23 – 2011-10-30 (×8): 20 mg via ORAL
  Filled 2011-10-23 (×8): qty 1

## 2011-10-23 MED ORDER — THERA M PLUS PO TABS
1.0000 | ORAL_TABLET | Freq: Every day | ORAL | Status: DC
  Administered 2011-10-23 – 2011-10-30 (×8): 1 via ORAL
  Filled 2011-10-23 (×8): qty 1

## 2011-10-23 NOTE — Progress Notes (Signed)
UR Completed.   Michele Schultz 10/23/2011  

## 2011-10-23 NOTE — Progress Notes (Signed)
3 Days Post-Op Procedure(s) (LRB): PERMANENT PACEMAKER INSERTION (N/A) Subjective: Tired after walk and bath. Appetite poor Mildly short of breath  Objective: Vital signs in last 24 hours: Temp:  [98.5 F (36.9 C)-99.8 F (37.7 C)] 99.8 F (37.7 C) (11/19 7829) Pulse Rate:  [83-100] 100  (11/19 5621) Cardiac Rhythm:  [-] Normal sinus rhythm (11/19 0815) Resp:  [12-25] 20  (11/19 0613) BP: (100-154)/(39-91) 154/75 mmHg (11/19 0613) SpO2:  [95 %-98 %] 96 % (11/19 0613) Weight:  [107.457 kg (236 lb 14.4 oz)] 236 lb 14.4 oz (107.457 kg) (11/19 3086)  Hemodynamic parameters for last 24 hours:    Intake/Output from previous day: 11/18 0701 - 11/19 0700 In: 1420 [P.O.:1420] Out: 1250 [Urine:1250] Intake/Output this shift:    General appearance: alert and no distress Neurologic: intact Heart: regular rate and rhythm and Tachy, regular Lungs: diminished breath sounds base - right Extremities: edema 1+ Wound: chest OK, left leg minimal serosanguinous drainage  Lab Results:  [facility] 10/23/11 0506 10/21/11 0725  WBC 8.7 9.4  HGB 8.4* 9.0*  HCT 26.1* 28.1*  PLT 255 191   BMET:  Basename 10/23/11 0506 10/21/11 0725  NA 142 141  K 3.7 3.5  CL 101 103  CO2 29 27  GLUCOSE 107* 119*  BUN 11 11  CREATININE 0.51 0.47*  CALCIUM 9.1 8.7    PT/INR:  Basename 10/23/11 0506  LABPROT 15.4*  INR 1.19   ABG    Component Value Date/Time   PHART 7.412* 10/18/2011 0459   HCO3 20.9 10/18/2011 0459   TCO2 24 10/18/2011 1653   ACIDBASEDEF 3.0* 10/18/2011 0459   O2SAT 95.0 10/18/2011 0459   CBG (last 3)   Basename 10/22/11 1547 10/22/11 1116 10/22/11 0730  GLUCAP 133* 106* 101*    Assessment/Plan: S/P Procedure(s) (LRB): PERMANENT PACEMAKER INSERTION (N/A) Mobilize Diuresis d/c pacing wires D/C planning will need SNF  Pt upset about Husband's condition. Apparently he had cardiac arrest last week and per patient is paralyzed and in a nursing home.   LOS: 6 days     Jacklyne Baik C 10/23/2011

## 2011-10-23 NOTE — Progress Notes (Signed)
Doing well s/p PPM CXR reviewed Pacemaker pocket looks good.   No further EP recs at this time. Will see as needed.   Call with questions.  My office will arrange 10 day follow-up for wound check.  Hillis Range, MD

## 2011-10-23 NOTE — Progress Notes (Signed)
Pulled EPW per MD order and hospital policy. All ends intact. No bleeding. Patient tolerated well. Bedrest for an hour and vitals being done per protocol.

## 2011-10-23 NOTE — Progress Notes (Signed)
CARDIAC REHAB PHASE I   PRE:  Rate/Rhythm: 99SR  BP:  Supine: 98/51  Sitting: 132/54  Standing:    SaO2: 95%RA  MODE:  Ambulation: 16 ft   POST:  Rate/Rhythem: 117  BP:  Supine: 146/61  Sitting:   Standing:    SaO2: 93%Ra  Pt had to sit in chair twice to walk 16 ft. Complained of dizziness. Weak. Asst x 2 with rolling walker. To bed after walk.Bp improved with activity. Will continue to see with PT. [id number]  [name]

## 2011-10-23 NOTE — Progress Notes (Addendum)
Physical Therapy Treatment Patient Details Name: MAKELA NIEHOFF MRN: [id number] DOB: 05-30-1931 Today's Date: 10/23/2011  PT Assessment/Plan  PT - Assessment/Plan Comments on Treatment Session: Patient is s/p pacemaker and AVR making steady progress.  Pt. did ambulate today but was limited by fatigue and nausea.  Needs continued PT to address issues.  Continue to recommend NHP with therapy.   PT Plan: Discharge plan remains appropriate;Frequency remains appropriate Follow Up Recommendations: Skilled nursing facility;24 hour supervision/assistance Equipment Recommended: Defer to next venue PT Goals  Acute Rehab PT Goals PT Goal: Rolling Supine to Left Side - Progress: Other (comment) PT Goal: Supine/Side to Sit - Progress: Other (comment) PT Transfer Goal: Sit to Stand/Stand to Sit - Progress: Progressing toward goal PT Goal: Ambulate - Progress: Progressing toward goal PT Goal: Up/Down Stairs - Progress: Other (comment)  PT Treatment Precautions/Restrictions  Precautions Precautions: Sternal;Fall;ICD/Pacemaker Precaution Comments: Sling D/C'd Required Braces or Orthoses: No Mobility (including Balance) Bed Mobility Rolling Left: Not tested (comment) Left Sidelying to Sit: Not tested (comment) Sitting - Scoot to Edge of Bed: Not tested (comment) Transfers Sit to Stand: 1: +2 Total assist;Patient percentage (comment);From chair/3-in-1;With armrests (Pt. = 50%) Sit to Stand Details (indicate cue type and reason): Pt. needed cues for hand placement to follow sternal precautions.  Continues to need to use momentum to get up.   Stand to Sit: 4: Min assist;To chair/3-in-1;With armrests Stand to Sit Details: needed assist and cues to control descent. Stand Pivot Transfers: 4: Min assist Stand Pivot Transfer Details (indicate cue type and reason): Pt. used RW during transfer to 3n1 and chair.  Pt. needed cues for sequencing steps and RW.  Slightly flexed  posture. Ambulation/Gait Ambulation/Gait: Yes Ambulation/Gait Assistance: 4: Min assist Ambulation/Gait Assistance Details (indicate cue type and reason): Pt. needed cues and assist for sequencing steps and RW.  Pt. takes short steps bil feet.  Needs standing rest breaks.  Needed assist to steer RW as well. Ambulation Distance (Feet): 20 Feet Assistive device: Rolling walker Gait Pattern: Decreased stride length;Shuffle;Antalgic;Trunk flexed Gait velocity: Slow cadence Stairs: No Wheelchair Mobility Wheelchair Mobility: No  Posture/Postural Control Posture/Postural Control: Postural limitations Postural Limitations: Slightly flexed posture Balance Balance Assessed: No Exercise  Total Joint Exercises Ankle Circles/Pumps: AROM;Both;10 reps;Seated Quad Sets: Both;5 reps;Seated;AROM Long Arc Quad: AROM;Both;5 reps;Seated End of Session PT - End of Session Equipment Utilized During Treatment: Gait belt Activity Tolerance: Patient limited by fatigue;Other (comment) (Limited by nausea.  Called nsg. to bring nausea meds) Patient left: in chair;with call bell in reach Nurse Communication: Mobility status for ambulation General Behavior During Session: [facility] for tasks performed Cognition: Impaired Cognitive Impairment: Continues with delayed processing of information but less of a delay than on evaluation.  Continues to need redirection to stay on task.    INGOLD,Brecklynn Jian 10/23/2011, 12:37 PM [facility] [phone] (pager)

## 2011-10-23 NOTE — Progress Notes (Signed)
Asked patient to walk.  She refused and said she was in too much pain and still dizzy from before.  Vitals stable.  Will continue to monitor.

## 2011-10-24 ENCOUNTER — Encounter (HOSPITAL_COMMUNITY): Payer: Self-pay

## 2011-10-24 LAB — GLUCOSE, CAPILLARY
Glucose-Capillary: 110 mg/dL — ABNORMAL HIGH (ref 70–99)
Glucose-Capillary: 109 mg/dL — ABNORMAL HIGH (ref 70–99)
Glucose-Capillary: 121 mg/dL — ABNORMAL HIGH (ref 70–99)

## 2011-10-24 MED ORDER — WARFARIN SODIUM 5 MG PO TABS
5.0000 mg | ORAL_TABLET | Freq: Every day | ORAL | Status: DC
  Administered 2011-10-24 – 2011-10-28 (×5): 5 mg via ORAL
  Filled 2011-10-24 (×6): qty 1

## 2011-10-24 NOTE — Progress Notes (Signed)
4 Days Post-Op Procedure(s) (LRB): PERMANENT PACEMAKER INSERTION (N/A) Subjective: C/o constipation Says her walk went better this morning  Objective: Vital signs in last 24 hours: Temp:  [98.5 F (36.9 C)-99.1 F (37.3 C)] 98.5 F (36.9 C) (11/20 0442) Pulse Rate:  [88-123] 88  (11/20 0442) Cardiac Rhythm:  [-] Normal sinus rhythm (11/20 0810) Resp:  [19-20] 20  (11/20 0442) BP: (98-106)/(51-61) 103/54 mmHg (11/20 0442) SpO2:  [93 %-99 %] 93 % (11/20 0442) Weight:  [108.274 kg (238 lb 11.2 oz)] 238 lb 11.2 oz (108.274 kg) (11/20 0449)  Hemodynamic parameters for last 24 hours:    Intake/Output from previous day: 11/19 0701 - 11/20 0700 In: 480 [P.O.:480] Out: -  Intake/Output this shift:    General appearance: alert and no distress Heart: regular rate and rhythm Wound: all are clean and dry  Lab Results:  [facility] 10/23/11 0506  WBC 8.7  HGB 8.4*  HCT 26.1*  PLT 255   BMET:  Basename 10/23/11 0506  NA 142  K 3.7  CL 101  CO2 29  GLUCOSE 107*  BUN 11  CREATININE 0.51  CALCIUM 9.1    PT/INR:  Basename 10/24/11 0608  LABPROT 14.9  INR 1.15   ABG    Component Value Date/Time   PHART 7.412* 10/18/2011 0459   HCO3 20.9 10/18/2011 0459   TCO2 24 10/18/2011 1653   ACIDBASEDEF 3.0* 10/18/2011 0459   O2SAT 95.0 10/18/2011 0459   CBG (last 3)   Basename 10/24/11 0541 10/23/11 2200 10/23/11 1645  GLUCAP 110* 111* 81    Assessment/Plan: S/P Procedure(s) (LRB): PERMANENT PACEMAKER INSERTION (N/A) Mobilize Diuresis Deconditioning- continue PT INR hasn't changed, increase coumadin to 5 mg daily R pleural effusion and R basilar atelectasis- continue diuresis and IS Complete heart block- DDD pacer in place, remains in SR D/C planning underway- will need SNF at d/c   LOS: 7 days    Deretha Ertle C 10/24/2011

## 2011-10-24 NOTE — Progress Notes (Signed)
CARDIAC REHAB PHASE I   PRE:  Rate/Rhythm: 92 SR  BP:  Supine:   Sitting: 102/42  Standing:    SaO2: 95 RA  MODE:  Ambulation: 32 ft   POST:  Rate/Rhythem: 116 ST  BP:  Supine:   Sitting: 129/49  Standing:    SaO2: 94 RA [id number] Pt weak with multiple c/o. Needs to have BM, nausea and hip pain. She tires easily had to sit in chair in  hall X two. Very slow pace. Pt exhausted by end of walk to chair with call light in reach.  Beatrix Fetters

## 2011-10-24 NOTE — Progress Notes (Signed)
CSW completed psychosocial assessment 11/19, located in shadow chart. Pt requesting SNF placement in [facility] and CSW has initiated SNF search. CSW will continue to follow for d/c planning.

## 2011-10-24 NOTE — Progress Notes (Signed)
Patient has refused second walk today.  She said she is too tired and has been up all day.  Will continue to monitor and try again later.

## 2011-10-25 LAB — GLUCOSE, CAPILLARY
Glucose-Capillary: 119 mg/dL — ABNORMAL HIGH (ref 70–99)
Glucose-Capillary: 88 mg/dL (ref 70–99)
Glucose-Capillary: 129 mg/dL — ABNORMAL HIGH (ref 70–99)

## 2011-10-25 LAB — PROTIME-INR: Prothrombin Time: 14.7 seconds (ref 11.6–15.2)

## 2011-10-25 LAB — BASIC METABOLIC PANEL
BUN: 18 mg/dL (ref 6–23)
Chloride: 102 mEq/L (ref 96–112)
GFR calc Af Amer: >90 mL/min (ref >90)
Potassium: 3.9 mEq/L (ref 3.5–5.1)

## 2011-10-25 MED ORDER — POLYETHYLENE GLYCOL 3350 17 G PO PACK
17.0000 g | PACK | Freq: Once | ORAL | Status: DC
  Filled 2011-10-25: qty 1

## 2011-10-25 MED ORDER — POTASSIUM CHLORIDE CRYS ER 20 MEQ PO TBCR
20.0000 meq | EXTENDED_RELEASE_TABLET | Freq: Every day | ORAL | Status: DC
  Administered 2011-10-25 – 2011-10-30 (×6): 20 meq via ORAL
  Filled 2011-10-25 (×5): qty 1

## 2011-10-25 MED ORDER — FUROSEMIDE 40 MG PO TABS
40.0000 mg | ORAL_TABLET | Freq: Every day | ORAL | Status: DC
  Administered 2011-10-26 – 2011-10-30 (×5): 40 mg via ORAL
  Filled 2011-10-25 (×5): qty 1

## 2011-10-25 MED ORDER — ZOLPIDEM TARTRATE 5 MG PO TABS: 5.0000 mg | ORAL_TABLET | Freq: Every evening | ORAL | Status: DC | PRN

## 2011-10-25 MED ORDER — FLEET ENEMA 7-19 GM/118ML RE ENEM
1.0000 | ENEMA | Freq: Every day | RECTAL | Status: DC | PRN
  Filled 2011-10-25 (×2): qty 1

## 2011-10-25 NOTE — Plan of Care (Addendum)
Problem: Phase III Progression Outcomes Goal: Discharge plan remains appropriate-arrangements made Outcome: Progressing Plan to d/c to rehab SNF     

## 2011-10-25 NOTE — Progress Notes (Signed)
Pt refused ambulation, stated she wasn't feeling well.  Michele Schultz 10/25/2011 7:09 PM

## 2011-10-25 NOTE — Plan of Care (Signed)
Problem: Phase III Progression Outcomes Goal: Ambulating in room or hall Outcome: Not Progressing Patient has been refusing third walk two days in a row and does not ambulate very far when up.

## 2011-10-25 NOTE — Plan of Care (Signed)
Problem: Phase III Progression Outcomes Goal: Discharge plan remains appropriate-arrangements made Outcome: Progressing Plan to d/c to rehab SNF

## 2011-10-25 NOTE — Progress Notes (Signed)
Patient was given an enema and suppository this evening and had a very large bowel movement.  Patient states that she feels much better.  She did not feel up to ambulating again this evening.  Will continue to monitor and encourage. Arva Chafe

## 2011-10-25 NOTE — Progress Notes (Addendum)
5 Days Post-Op  Procedure(s) (LRB): PERMANENT PACEMAKER INSERTION (N/A) Subjective: C/o constipation with abdominal cramps. + Flatus. Feeling stronger.  Objective  Telemetry NSR/STach  Temp:  [97.7 F (36.5 C)-99.6 F (37.6 C)] 99.5 F (37.5 C) (11/21 0624) Pulse Rate:  [81-90] 81  (11/21 0624) Resp:  [19-20] 19  (11/21 0624) BP: (104-132)/(51-65) 132/65 mmHg (11/21 0624) SpO2:  [92 %-98 %] 97 % (11/21 0624) Weight:  [235 lb (106.595 kg)] 235 lb (106.595 kg) (11/21 7829)   Intake/Output Summary (Last 24 hours) at 10/25/11 0751 Last data filed at 10/24/11 1300  Gross per 24 hour  Intake    480 ml  Output      0 ml  Net    480 ml   Wt 235# today, yesterday 238#    General appearance: alert, cooperative and no distress Heart: regular rate and rhythm, S1, S2 normal, no murmur, click, rub or gallop Lungs: mildly diminished in the bases Abdomen: NABS, soft , nondistended, no TTP Extremities: no edema Wound: incisions without signs on infection, healing well.  Lab Results:  [facility] 10/23/11 0506  NA 142  K 3.7  CL 101  CO2 29  GLUCOSE 107*  BUN 11  CREATININE 0.51  CALCIUM 9.1  MG --  PHOS --   No results found for this basename: AST:2,ALT:2,ALKPHOS:2,BILITOT:2,PROT:2,ALBUMIN:2 in the last 72 hours No results found for this basename: LIPASE:2,AMYLASE:2 in the last 72 hours  Basename 10/23/11 0506  WBC 8.7  NEUTROABS --  HGB 8.4*  HCT 26.1*  MCV 90.9  PLT 255   No results found for this basename: CKTOTAL:4,CKMB:4,TROPONINI:4 in the last 72 hours No results found for this basename: POCBNP:3 in the last 72 hours No results found for this basename: DDIMER in the last 72 hours No results found for this basename: HGBA1C in the last 72 hours No results found for this basename: CHOL,HDL,LDLCALC,TRIG,CHOLHDL in the last 72 hours No results found for this basename: TSH,T4TOTAL,FREET3,T3FREE,THYROIDAB in the last 72 hours No results found for this basename:  VITAMINB12,FOLATE,FERRITIN,TIBC,IRON,RETICCTPCT in the last 72 hours  Medications: Scheduled    . amLODipine  5 mg Oral Daily  . cycloSPORINE  1 drop Both Eyes BID  . docusate sodium  200 mg Oral Daily  . ferrous sulfate  325 mg Oral Q breakfast  . furosemide  40 mg Oral BID  . insulin aspart  0-24 Units Subcutaneous TID AC & HS  . insulin glargine  20 Units Subcutaneous QAM  . metoprolol  50 mg Oral Daily  . multivitamins ther. w/minerals  1 tablet Oral Daily  . olmesartan  20 mg Oral Daily  . oxybutynin  10 mg Oral Daily  . pantoprazole  40 mg Oral QAC breakfast  . potassium chloride  20 mEq Oral BID  . povidone-iodine  1 application Topical BID  . simvastatin  20 mg Oral q1800  . sodium chloride  3 mL Intravenous Q12H  . warfarin  5 mg Oral q1800  . warfarin   Does not apply Once  . DISCONTD: warfarin  2.5 mg Oral q1800     Radiology/Studies:  No results found.  INR: Will add last result for INR, ABG once components are confirmed Will add last 4 CBG results once components are confirmed  Assessment/Plan: S/P Procedure(s) (LRB): PERMANENT PACEMAKER INSERTION (N/A)/CabgX2/AVR 1.constipation, will try enema. 2. Decrease lasix to once daily 3. Cont rehab 4.AC rx INR 1.3 today 5. sugars well controlled  LOS: 8 days    GOLD,WAYNE E 11/21/20127:51  AM   Continues to slowly progress Has SNF bed for Monday 11/26

## 2011-10-25 NOTE — Progress Notes (Addendum)
CSW gave pt bed offers. Pt considering [facility] for rehab placement.CSW will continue to follow for d/c planning Baxter Flattery, MSW [phone]   Pt has selected bed at [facility] in [place]. Facility will not be accepting admissions over the holiday weekend, so plan for d/c Monday, if pt medically ready.

## 2011-10-25 NOTE — Progress Notes (Signed)
Physical Therapy Treatment Patient Details Name: Michele Schultz MRN: [id number] DOB: 09/30/1931 Today's Date: 10/25/2011  PT Assessment/Plan  PT - Assessment/Plan Comments on Treatment Session: Patient is s/p pacemaker and AVR continuing to make slow but steady progress.  Limited today by fatigue and c/o nausea at end of walk.  Needs continued PT to address mobility issues.   PT Plan: Discharge plan remains appropriate;Frequency remains appropriate PT Frequency: Min 3X/week Follow Up Recommendations: Skilled nursing facility;24 hour supervision/assistance Equipment Recommended: Defer to next venue PT Goals  Acute Rehab PT Goals PT Goal: Rolling Supine to Left Side - Progress: Other (comment) PT Goal: Supine/Side to Sit - Progress: Other (comment) PT Transfer Goal: Sit to Stand/Stand to Sit - Progress: Progressing toward goal PT Goal: Ambulate - Progress: Progressing toward goal PT Goal: Up/Down Stairs - Progress: Other (comment)  PT Treatment Precautions/Restrictions  Precautions Precautions: Fall;Sternal;ICD/Pacemaker Precaution Comments: Sling D/C'd Required Braces or Orthoses: No Restrictions Weight Bearing Restrictions: No Mobility (including Balance) Bed Mobility Rolling Left: Not tested (comment) Rolling Left Details (indicate cue type and reason): Pt. in chair Left Sidelying to Sit: Not tested (comment) Sitting - Scoot to Edge of Bed: 4: Min assist Sitting - Scoot to [place] of Bed Details (indicate cue type and reason): used pad and cues for reciprocal scooting to edge of chair.  Pt. needs cues to not use UEs Transfers Sit to Stand: 1: +2 Total assist;Patient percentage (comment);Without upper extremity assist;From chair/3-in-1;From elevated surface (pt. = 60%) Sit to Stand Details (indicate cue type and reason): Pt. needed cues for hand placement but did come up a little easier than yesterday.  Uses momentum to get up. Stand to Sit: 1: +2 Total assist;Patient percentage  (comment);Without upper extremity assist;To chair/3-in-1;To toilet (pt. = 70%) Stand to Sit Details: Needed assist and cues to control descent especially as she fatigued. Stand Pivot Transfers: Not tested (comment) Ambulation/Gait Ambulation/Gait Assistance: 4: Min assist Ambulation/Gait Assistance Details (indicate cue type and reason): Pt needed cues and assist for sequencing steps and RW.  Continues to take short steps with bil feet.  Needs multiple standing rest breaks and took 1 sitting rest break.  Ambulated 35 feet x 1 and 25 feet x1.  Needed assist to steer RW as well.   Ambulation Distance (Feet): 60 Feet Assistive device: Rolling walker Gait Pattern: Step-through pattern;Decreased stride length;Shuffle;Antalgic;Trunk flexed Gait velocity: Continues with slow cadence Stairs: No Wheelchair Mobility Wheelchair Mobility: No  Posture/Postural Control Posture/Postural Control: Postural limitations Postural Limitations: Continues with slightly flexed posture throughout Balance Balance Assessed: No Exercise    End of Session PT - End of Session Equipment Utilized During Treatment: Gait belt Activity Tolerance: Patient limited by fatigue;Patient limited by pain Patient left: in chair;with call bell in reach Nurse Communication: Mobility status for ambulation General Behavior During Session: [facility] for tasks performed Cognition: [facility] for tasks performed Cognitive Impairment: Improved processing and less redirection needed today.  Pt. is very self limiting.    INGOLD,Amma Crear 10/25/2011, 3:41 PM [facility] Acute Rehabilitation [phone] (pager)

## 2011-10-26 LAB — GLUCOSE, CAPILLARY: Glucose-Capillary: 109 mg/dL — ABNORMAL HIGH (ref 70–99)

## 2011-10-26 LAB — PROTIME-INR
INR: 1.19 (ref 0.00–1.49)
Prothrombin Time: 15.4 seconds — ABNORMAL HIGH (ref 11.6–15.2)

## 2011-10-26 MED ORDER — METFORMIN HCL 500 MG PO TABS
500.0000 mg | ORAL_TABLET | Freq: Two times a day (BID) | ORAL | Status: DC
  Administered 2011-10-26 – 2011-10-30 (×9): 500 mg via ORAL
  Filled 2011-10-26 (×11): qty 1

## 2011-10-26 NOTE — Progress Notes (Addendum)
6 Days Post-Op Procedure(s) (LRB): PERMANENT PACEMAKER INSERTION (N/A)  Subjective: Patient with a brief episode of confusion earlier this am, but she is alert and oriented now. Patient with large bowel movement 11/21.  Objective: Vital signs in last 24 hours: Patient Vitals for the past 24 hrs:  BP Temp Temp src Pulse Resp SpO2 Weight  10/26/11 0544 124/74 mmHg 98.8 F (37.1 C) Oral 96  18  95 % 234 lb 6.4 oz (106.323 kg)  10/25/11 2050 106/68 mmHg 98.5 F (36.9 C) Oral 98  20  95 % -  10/25/11 1442 104/48 mmHg 98.9 F (37.2 C) Oral 88  20  98 % -  10/25/11 1144 - - - 112  - 94 % -  10/25/11 1139 - - - 87  - 93 % -  10/25/11 1031 97/49 mmHg - - - - - -   Pre op weight  105 kg Current Weight  10/26/11 234 lb 6.4 oz (106.323 kg)    Hemodynamic parameters for last 24 hours:    Intake/Output from previous day: 11/21 0701 - 11/22 0700 In: 243 [P.O.:240; I.V.:3] Out: 452 [Urine:450; Stool:2]   Physical Exam:  Cardiovascular: RRR, no murmurs, gallops, or rubs. Pulmonary: S;ightly decreased at the bases; no rales, wheezes, or rhonchi. Abdomen: Soft, non tender, bowel sounds present. Extremities: Trace bilateral lower extremity edema. Wounds: Clean and dry.  No erythema or signs of infection.  Lab Results: CBC:No results found for this basename: WBC:2,HGB:2,HCT:2,PLT:2 in the last 72 hours BMET:  [facility] 10/25/11 0630  NA 141  K 3.9  CL 102  CO2 30  GLUCOSE 115*  BUN 18  CREATININE 0.60  CALCIUM 9.4    PT/INR:  Basename 10/26/11 0630  LABPROT 15.4*  INR 1.19   ABG:  INR: Will add last result for INR, ABG once components are confirmed Will add last 4 CBG results once components are confirmed  Assessment/Plan:  1. CV - SR. Continue Norvasc 5, Benicar 20, and Toprol XL 50 daily.  Also, continue with Coumadin 5 daily. 2.  Pulmonary - Encourage incentive spirometer. 3. Volume Overload - Continue with daily Lasix for diuresis. 4.  Acute blood loss anemia -  Last H/H 8.4/26.1. Continue with ferrous sulfate. 5.DM-CBGs 88/129/142. Pre op HGA1C 6.6. Patient was "border line" diet controlled diabetic prior to admission.  Will start on Metformin and stop scheduled insulin.  Patient will need follow up as an outpatient. 6. Will need SNF upon discharge (likely on Monday).  Ardelle Balls, PA 10/26/2011   Chart reviewed and patient examined.  Agree with above

## 2011-10-26 NOTE — Plan of Care (Signed)
Problem: Problem: Mobility Progression Goal: GETS OUT OF BED Outcome: Completed/Met Date Met:  10/26/11 Will get out of bed to chair, but states too tired to walk today

## 2011-10-26 NOTE — Progress Notes (Signed)
Pt experienced episode of confusion this morning.  She did not remember what city/hospital she is in or why she is here.  Reoriented the patient to place and situation.  Will continue to monitor.   Michele Schultz. Konrad Dolores, RN

## 2011-10-26 NOTE — Progress Notes (Signed)
Pt refused ambulation again, states that she is too tired and not feeling well today. Discussed importance, but pt still refused. Will continue to monitor.   Ninetta Lights 10/26/2011 5:56 PM

## 2011-10-27 LAB — CBC
HCT: 25.3 % — ABNORMAL LOW (ref 36.0–46.0)
MCV: 91.3 fL (ref 78.0–100.0)
RBC: 2.77 MIL/uL — ABNORMAL LOW (ref 3.87–5.11)
RDW: 14.4 % (ref 11.5–15.5)
WBC: 8.2 10*3/uL (ref 4.0–10.5)

## 2011-10-27 LAB — GLUCOSE, CAPILLARY
Glucose-Capillary: 128 mg/dL — ABNORMAL HIGH (ref 70–99)
Glucose-Capillary: 105 mg/dL — ABNORMAL HIGH (ref 70–99)

## 2011-10-27 MED ORDER — OXYCODONE HCL 5 MG PO TABS: 5.0000 mg | ORAL_TABLET | Freq: Four times a day (QID) | ORAL | Status: AC | PRN

## 2011-10-27 MED ORDER — OLMESARTAN MEDOXOMIL 20 MG PO TABS: 20.0000 mg | ORAL_TABLET | Freq: Every day | ORAL | Status: DC

## 2011-10-27 MED ORDER — WARFARIN SODIUM 5 MG PO TABS: 5.0000 mg | ORAL_TABLET | Freq: Every day | ORAL | Status: DC

## 2011-10-27 MED ORDER — POTASSIUM CHLORIDE CRYS ER 20 MEQ PO TBCR: 20.0000 meq | EXTENDED_RELEASE_TABLET | Freq: Every day | ORAL | Status: AC

## 2011-10-27 MED ORDER — FUROSEMIDE 40 MG PO TABS: 40.0000 mg | ORAL_TABLET | Freq: Every day | ORAL | Status: DC

## 2011-10-27 MED ORDER — METFORMIN HCL 500 MG PO TABS: 500.0000 mg | ORAL_TABLET | Freq: Two times a day (BID) | ORAL | Status: DC

## 2011-10-27 NOTE — Discharge Summary (Signed)
Michele Schultz 09-12-1931 77 y.o. 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 Charlett Schultz for evaluation of aortic valve stenosis. She recently has been having chest pain which she describes as sharp and often related to food and thought it will could likely be indigestion. She also has been having shortness of breath with exertion. She states when walking with a rapid pace or walking up an incline she became short of breath and feels extremely fatigued. She is noted to that this happened with progressively less exertion required to trigger the shortness of breath and fatigue. She denied orthopnea or paroxysmal nocturnal dyspnea. Echocardiogram showed normal left ventricular function with severe aortic stenosis and a valve area of 0.7 cm. She underwent cardiac catheterization which revealed a 50%eccentric left main stenosis. He was felt to be a candidate for aortic valve replacement and coronary artery bypass grafting and was admitted this hospitalization for the procedure.  Past surgical history: 1. Right knee total replacement 2. Nasal sinus surgery 3. Total hip arthroplasty  Family history: 1. Heart disease in 3 brothers and one sister.  Social history: Nonsmoker/ nonalcohol user  Review of symptoms and physical exam please see the dictated history and physical.   Hospital Course:  The patient was admitted to the hospital and taken to the operating room on 10/17/2011 - 10/20/2011 and underwent Procedure(s): Coronary artery bypass grafting x2 with saphenous vein graft to the LAD; saphenous vein graft to the obtuse marginal. Aortic without replacement with a 21 mm magna ease bovine pericardial valve.  Postoperative hospital course: The patient has done well. Her primary difficulty was related to complete heart block. Subsequent to this she did require an additional  procedure. Electrophysiology consultation was obtained with Dr. Hillis Schultz who placed the pacemaker on 10/20/2011. For full details please see the dictated operative note.  Following this procedure she has continued to progress nicely. She has had some hypertension requiring adjustment to her medications. She has been started on Coumadin. She is tolerating gradually increasing activities using standard protocols in addition has responded to routine pulmonary toilet measures. She has had some acute volume overload but is responding well to diuresis which will continue as an outpatient short-term. She does have an acute blood loss anemia. Her values have stabilized with most recent hemoglobin and hematocrit 7.9 and 25.3 respectively. She has been started on iron. She has been fairly weak during the postoperative period requiring physical therapy.it is felt that she will require at least a short-term stay in a skilled nursing facility for ongoing rehabilitation. Blood sugars have been under good control. Her preoperative hemoglobin A1c is 6.6. She is on metformin and sugars are under good control. She will need followup by her primary physician as an outpatient. All routine lines, monitors, drainage devices have been discontinued in a standard fashion. Incisions are healing well without evidence of infection. Oxygen has been weaned and she maintains adequate saturations on room air. Currently her status is felt to be tentatively stable for transfer to a skilled facility in the next few days pending ongoing recovery.      Basename 10/25/11 0630  NA 141  K 3.9  CL 102  CO2 30  GLUCOSE 115*  BUN 18  CALCIUM 9.4    Basename 10/27/11 0600  WBC 8.2  HGB 7.9*  HCT 25.3*  PLT 352    Basename  10/27/11 0600 10/26/11 0630  INR 1.24 1.19     Discharge Instructions:  The patient is discharged to home with extensive instructions on wound care and progressive ambulation.  They are instructed not to  drive or perform any heavy lifting until returning to see the physician in his office.She will need coumadin management by the facility and long term per her cardiologist.  Discharge Diagnosis:  Aortic stenosis; CAD bradicardia/complete heart block Volume overload ABL anemia   Secondary Diagnosis: Patient Active Problem List  Diagnoses  . Spinal stenosis  . Fatigue  . CAD (coronary artery disease)  . Aortic valve stenosis  . Hyperlipidemia LDL goal < 70  . HTN (hypertension)  . Complete heart block   Past Medical History  Diagnosis Date  . Hypertension   . Anemia   . Iron deficiency anemia, unspecified   . Neuralgia, neuritis, and radiculitis, unspecified   . Other and unspecified hyperlipidemia   . Aortic valve disorders     s/p AVR for severe AS  . Onychia and paronychia of toe   . Osteoarthrosis, unspecified whether generalized or localized, pelvic region and thigh     mainly in her back and knees  . Enthesopathy of hip region   . Esophageal reflux     followed by Dr.Seigal. stabilized with a combination of Nexium and Zantac  . Knee joint replacement by other means   . Stress incontinence, female     followed by Dr.Cope  . Coronary atherosclerosis of native coronary artery   . Coronary atherosclerosis of autologous vein bypass graft   . Shortness of breath     due to CAD, /w exertion  . Type II or unspecified type diabetes mellitus without mention of complication, not stated as uncontrolled     pt. reports that she is borderline   . Spinal stenosis        Michele, Schultz  Home Medication Instructions [id number]   Printed on:10/27/11 1417  Medication Information                    oxybutynin (DITROPAN-XL) 10 MG 24 hr tablet Take 10 mg by mouth daily.             fish oil-omega-3 fatty acids 1000 MG capsule Take 2 g by mouth 2 (two) times daily.             Multiple Vitamin (MULTIVITAMIN) tablet Take 1 tablet by mouth daily.             metoprolol  (TOPROL-XL) 50 MG 24 hr tablet Take 50 mg by mouth daily.             Cyanocobalamin (VITAMIN B-12) 1000 MCG/15ML LIQD Inject 1,000 mcg as directed every 30 (thirty) days.             cycloSPORINE (RESTASIS) 0.05 % ophthalmic emulsion Place 1 drop into both eyes 2 (two) times daily.             Cholecalciferol (VITAMIN D) 2000 UNITS CAPS Take 1 capsule by mouth daily.             Calcium Carbonate-Vitamin D (CALCIUM 600+D) 600-200 MG-UNIT TABS Take 1 tablet by mouth 2 (two) times daily.             pantoprazole (PROTONIX) 40 MG tablet Take 40 mg by mouth 2 (two) times daily.             Ferrous Sulfate Dried (SLOW RELEASE IRON)  140 (45 FE) MG TBCR Take 1 tablet by mouth daily.             atorvastatin (LIPITOR) 40 MG tablet Take 40 mg by mouth daily.             aspirin 81 MG tablet Take 81 mg by mouth daily.             amLODipine (NORVASC) 5 MG tablet Take 1 tablet (5 mg total) by mouth daily.           furosemide (LASIX) 40 MG tablet Take 1 tablet (40 mg total) by mouth daily.           metFORMIN (GLUCOPHAGE) 500 MG tablet Take 1 tablet (500 mg total) by mouth 2 (two) times daily with a meal.           oxyCODONE (OXY IR/ROXICODONE) 5 MG immediate release tablet Take 1-2 tablets (5-10 mg total) by mouth every 6 (six) hours as needed.           potassium chloride SA (K-DUR,KLOR-CON) 20 MEQ tablet Take 1 tablet (20 mEq total) by mouth daily.           olmesartan (BENICAR) 20 MG tablet Take 1 tablet (20 mg total) by mouth daily.           warfarin (COUMADIN) 5 MG tablet Take 1 tablet (5 mg total) by mouth daily at 6 PM.             Disposition:  skilled nursing facility   Patient's condition is Good  Gershon Crane, PA-C 10/27/2011  2:17 PM

## 2011-10-27 NOTE — Progress Notes (Signed)
Physical Therapy Treatment Patient Details Name: Michele Schultz MRN: [id number] DOB: 06-23-1931 Today's Date: 10/27/2011  PT Assessment/Plan  PT - Assessment/Plan Comments on Treatment Session: Patient is s/p pacemaker and AVR with improved mobility agreeing to ambulate further today.  She continues to limit herself at times but is progressing.  Patient met 4/5 goals of 10/21/11.  Goals revised for two weeks from today which will be 11/10/11.   PT Plan: Discharge plan remains appropriate;Frequency remains appropriate PT Frequency: Min 3X/week Follow Up Recommendations: Skilled nursing facility;24 hour supervision/assistance Equipment Recommended: Defer to next venue PT Goals  Acute Rehab PT Goals PT Goal Formulation: With patient PT Goal: Rolling Supine to Left Side - Progress: Met PT Goal: Supine/Side to Sit - Progress: Met PT Transfer Goal: Sit to Stand/Stand to Sit - Progress: Met PT Goal: Ambulate - Progress: Met PT Goal: Up/Down Stairs - Progress: Other (comment) Additional Goals Additional Goal #1: Supine to sit with independence and no cues. PT Goal: Additional Goal #1 - Progress: Other (comment) Additional Goal #2: Sit to stand with supervision and no cues. PT Goal: Additional Goal #2 - Progress: Other (comment) Additional Goal #3: Patient to ambulate 150 feet with supervision and RW. PT Goal: Additional Goal #3 - Progress: Other (comment)  PT Treatment Precautions/Restrictions  Precautions Precautions: Fall;Sternal;ICD/Pacemaker Precaution Comments: Sling D/C'd Required Braces or Orthoses: No Restrictions Weight Bearing Restrictions: No Mobility (including Balance) Bed Mobility Bed Mobility: Yes Rolling Right: 5: Supervision Rolling Right Details (indicate cue type and reason): pt. followed log roll technique without cues Rolling Left: Not tested (comment) Right Sidelying to Sit: 5: Supervision;With rails;HOB flat Right Sidelying to Sit Details (indicate cue type  and reason): Pt. followed sternal precautions with minimal use of UEs Left Sidelying to Sit: Not tested (comment) Sitting - Scoot to Edge of Bed: 5: Supervision (took incr. time) Sitting - Scoot to Edge of Bed Details (indicate cue type and reason): Pt. reciprocally scooted to EOB without cues Transfers Transfers: Yes Sit to Stand: 4: Min assist;From bed;From chair/3-in-1;With upper extremity assist Sit to Stand Details (indicate cue type and reason): Pt. placed hands on knees - did need cues to remember to place hands on knees.  Pt. only needed min assist when getting up from lower chair.  From bed that was slightly raised, pt. able to stand up with supervision.   Stand to Sit: 5: Supervision;To chair/3-in-1;With upper extremity assist (UEs on knees, cues to remember to place UEs on knees) Stand Pivot Transfers: Not tested (comment) Ambulation/Gait Ambulation/Gait: Yes Ambulation/Gait Assistance: 4: Min assist (guard assist as pt.still c/o dizziness and unsure of herself) Ambulation/Gait Assistance Details (indicate cue type and reason): Pt. needs continued cues and assist to stay close to RW.  Takes shortened step length bil feet as well.  Takes several standing rest breaks and 1 sitting rest break.  Ambulated 75 feet x 1 and then 45 x1 Ambulation Distance (Feet): 120 Feet Assistive device: Rolling walker Gait Pattern: Step-to pattern;Decreased stride length;Shuffle;Antalgic;Trunk flexed Gait velocity: Continues with slow cadence Stairs: No Wheelchair Mobility Wheelchair Mobility: No  Posture/Postural Control Posture/Postural Control: Postural limitations Postural Limitations: Continues with slightly flexed posture throughout Balance Balance Assessed: No Exercise  Total Joint Exercises Ankle Circles/Pumps: AROM;Both;10 reps;Seated Quad Sets: AROM;Both;10 reps;Supine Long Arc Quad: AROM;5 reps;Both;Seated End of Session PT - End of Session Equipment Utilized During Treatment: Gait  belt Activity Tolerance: Patient tolerated treatment well Patient left: in chair;with call bell in reach Nurse Communication: Mobility status for ambulation General  Behavior During Session: [facility] for tasks performed Cognition: [facility] for tasks performed Cognitive Impairment: Improved processing and less redirection needed today.  Pt. is very self limiting.    INGOLD,Vlada Uriostegui 10/27/2011, 9:28 AM [facility] [phone] (pager)

## 2011-10-27 NOTE — Discharge Instructions (Signed)
Coronary Artery Bypass Grafting Care After Refer to this sheet in the next few weeks. These instructions provide you with information on caring for yourself after your procedure. Your caregiver may also give you more specific instructions. Your treatment has been planned according to current medical practices, but problems sometimes occur. Call your caregiver if you have any problems or questions after your procedure.  Recovery from open heart surgery will be different for everyone. Some people feel well after 3 or 4 weeks, while for others it takes longer. After heart surgery, it may be normal to:  Not have an appetite, feel nauseated by the smell of food, or only want to eat a small amount.   Be constipated because of changes in your diet, activity, and medicines. Eat foods high in fiber. Add fresh fruits and vegetables to your diet. Stool softeners may be helpful.   Feel sad or unhappy. You may be frustrated or cranky. You may have good days and bad days. Do not give up. Talk to your caregiver if you do not feel better.   Feel weakness and fatigue. You many need physical therapy or cardiac rehabilitation to get your strength back.   Develop an irregular heartbeat called atrial fibrillation. Symptoms of atrial fibrillation are a fast, irregular heartbeat or feelings of fluttery heartbeats, shortness of breath, low blood pressure, and dizziness. If these symptoms develop, see your caregiver right away.  MEDICATION  Have a list of all the medicines you will be taking when you leave the hospital. For every medicine, know the following:   Name.   Exact dose.   Time of day to be taken.   How often it should be taken.   Why you are taking it.   Ask which medicines should or should not be taken together. If you take more than one heart medicine, ask if it is okay to take them together. Some heart medicines should not be taken at the same time because they may lower your blood pressure too  much.   Narcotic pain medicine can cause constipation. Eat fresh fruits and vegetables. Add fiber to your diet. Stool softener medicine may help relieve constipation.   Keep a copy of your medicines with you at all times.   Do not add or stop taking any medicine until you check with your caregiver.   Medicines can have side effects. Call your caregiver who prescribed the medicine if you:   Start throwing up, have diarrhea, or have stomach pain.   Feel dizzy or lightheaded when you stand up.   Feel your heart is skipping beats or is beating too fast or too slow.   Develop a rash.   Notice unusual bruising or bleeding.  HOME CARE INSTRUCTIONS  After heart surgery, it is important to learn how to take your pulse. Have your caregiver show you how to take your pulse.   Use your incentive spirometer. Ask your caregiver how long after surgery you need to use it.  Care of your chest incision  Tell your caregiver right away if you notice clicking in your chest (sternum).   Support your chest with a pillow or your arms when you take deep breaths and cough.   Follow your caregiver's instructions about when you can bathe or swim.   Protect your incision from sunlight during the first year to keep the scar from getting dark.   Tell your caregiver if you notice:   Increased tenderness of your incision.   Increased redness  or swelling around your incision.   Drainage or pus from your incision.  Care of your leg incision(s)  Avoid crossing your legs.   Avoid sitting for long periods of time. Change positions every half hour.   Elevate your leg(s) when you are sitting.   Check your leg(s) daily for swelling. Check the incisions for redness or drainage.   Wear your elastic stockings as told by your caregiver. Take them off at bedtime.  Diet  Diet is very important to heart health.   Eat plenty of fresh fruits and vegetables. Meats should be lean cut. Avoid canned, processed, and  fried foods.   Talk to a dietician. They can teach you how to make healthy food and drink choices.  Weight  Weigh yourself every day. This is important because it helps to know if you are retaining fluid that may make your heart and lungs work harder.   Use the same scale each time.   Weigh yourself every morning at the same time. You should do this after you go to the bathroom, but before you eat breakfast.   Your weight will be more accurate if you do not wear any clothes.   Record your weight.   Tell your caregiver if you have gained 2 pounds or more overnight.  Activity Stop any activity at once if you have chest pain, shortness of breath, irregular heartbeats, or dizziness. Get help right away if you have any of these symptoms.  Bathing.  Avoid soaking in a bath or hot tub until your incisions are healed.   Rest. You need a balance of rest and activity.   Exercise. Exercise per your caregiver's advice. You may need physical therapy or cardiac rehabilitation to help strengthen your muscles and build your endurance.   Climbing stairs. Unless your caregiver tells you not to climb stairs, go up stairs slowly and rest if you tire. Do not pull yourself up by the handrail.   Driving a car. Follow your caregiver's advice on when you may drive. You may ride as a passenger at any time. When traveling for long periods of time in a car, get out of the car and walk around for a few minutes every 2 hours.   Lifting. Avoid lifting, pushing, or pulling anything heavier than 10 pounds for 6 weeks after surgery or as told by your caregiver.   Returning to work. Check with your caregiver. People heal at different rates. Most people will be able to go back to work 6 to 12 weeks after surgery.   Sexual activity. You may resume sexual relations as told by your caregiver.  SEEK MEDICAL CARE IF:  Any of your incisions are red, painful, or have any type of drainage coming from them.   You have an  oral temperature above 102 F (38.9 C).   You have ankle or leg swelling.   You have pain in your legs.   You have weight gain of 2 or more pounds a day.   You feel dizzy or lightheaded when you stand up.  SEEK IMMEDIATE MEDICAL CARE IF:  You have angina or chest pain that goes to your jaw or arms. Call your local emergency services right away.   You have shortness of breath at rest or with activity.   You have a fast or irregular heartbeat (arrhythmia).   There is a "clicking" in your sternum when you move.   You have numbness or weakness in your arms or legs.  MAKE SURE YOU:  Understand these instructions.   Will watch your condition.   Will get help right away if you are not doing well or get worse.  Document Released: 06/09/2005 Document Revised: 08/02/2011 Document Reviewed: 01/25/2011 [facility] Patient Information 2012 [place], [place]. Pacemaker Implantation Care After Always carry your pacemaker card with you. The card should list the implant date, device model and manufacturer.  HOME CARE INSTRUCTIONS  Keep the incision dry for a week after the procedure. It may take several weeks for the incision to heal.   For about 6 weeks, avoid sudden jerking, pulling or chopping movements that pull your arm away from your body. For instance, you should not play golf for 6 weeks.   Do not raise your arms above your shoulders for 1-2 weeks or as told by your caregiver.   Take medicine as told by your caregiver.   Learn how to check your pulse. Follow directions about when to call or be concerned.   Exercise as told by your caregiver.   Household appliances do not interfere with pacemakers.   Travel by airplane should not be a problem. Tell security you have a pacemaker before going through the metal detector. Carry your pacemaker ID card with you.   Never leave a cell phone in a pocket over the pacemaker.   Avoid strong electro-magnetic fields. You will not be able to  have an MRI scan because of the strong magnets.  PACEMAKER CARE:  Avoid putting pressure over the area where the pacer was put in.   Digital cell phones should be kept 12 inches away from the pacemaker. Hold the cell phone to the ear opposite of the pacemaker.   Never leave a cell phone in a pocket over the pacemaker.   Avoid strong electro-magnetic fields. You will not be able to have an MRI scan because of the strong magnets.   Pacer batteries last about 5 years and give off warning signals when they are running low on power. Pacers may be checked every 3 months. This allows plenty of time to change the generator when it is running low on power.   Changing the battery means removing the old generator through the same cut and plugging the existing wires into the new generator.   An EKG or heart monitor is used to see if your pacer is working properly. Sometimes signals may be sent over a land line phone to your clinic.   Wear a medical alert bracelet. This can help emergency responders know you have a pacemaker.  SEEK MEDICAL CARE IF:  You begin to gain weight and your feet and ankles swell.   You have dizzy spells or feel weak.   Your pulse rate drops below the limit or is too fast.   You have redness and swelling over your pacemaker insertion site.  SEEK IMMEDIATE MEDICAL CARE IF:  You faint or pass out.   You have chest pain or shortness of breath.   You are injured and think your pacemaker may have been damaged.   You are suddenly very tired or have pain in your back.   You have yellow drainage coming from the pacemaker incision site.   You are worried that your heart is not beating right or cannot feel your pulse.  Document Released: 06/09/2005 Document Revised: 08/02/2011 Document Reviewed: 06/22/2008 [facility] Patient Information 2012 [place], [place].Aortic Valve Replacement Care After Read the instructions outlined below and refer to this sheet for the next few  weeks. These discharge instructions provide you with general information on caring for yourself after you leave the hospital. Your surgeon may also give you specific instructions. While your treatment has been planned according to the most current medical practices available, unavoidable complications occasionally occur. If you have any problems or questions after discharge, please call your surgeon. AFTER THE PROCEDURE  Full recovery from heart valve surgery can take several months.   Blood thinning (anticoagulation) treatment with warfarin is often prescribed for 6 weeks to 3 months after surgery for those with biological valves. It is prescribed for life for those with mechanical valves.   Recovery includes healing of the surgical incision. There is a gradual building of stamina and exercise abilities. An exercise program under the direction of a physical therapist may be recommended.   Once you have an artificial valve, your heart function and your life will return to normal. You usually feel better after surgery. Shortness of breath and fatigue should lessen. If your heart was already severely damaged before your surgery, you may continue to have problems.   You can usually resume most of your normal activities. You will have to continue to monitor your condition. You need to watch out for blood clots and infections.   Artificial valves need to be replaced after a period of time. It is important that you see your caregiver regularly.   Some individuals with an aortic valve replacement need to take antibiotics before having dental work or other surgical procedures. This is called prophylactic antibiotic treatment. These drugs help to prevent infective endocarditis. Antibiotics are only recommended for individuals with the highest risk for developing infective endocarditis. Let your dentist and your caregiver know if you have a history of any of the following so that the necessary precautions can  be taken:   A VSD.   A repaired VSD.   Endocarditis in the past.   An artificial (prosthetic) heart valve.  HOME CARE INSTRUCTIONS   Use all medications as prescribed.   Take your temperature every morning for the first week after surgery. Record these.   Weigh yourself every morning for at least the first week after surgery and record.   Do not lift more than 10 pounds (4.5 kg) until your breastbone (sternum) has healed. Avoid all activities which would place strain on your incision.   You may shower as soon as directed by your caregiver after surgery. Pat incisions dry. Do not rub incisions with washcloth or towel.   Avoid driving for 4 to 6 weeks following surgery or as instructed.   Use your elastic stockings during the day. You should wear the stockings for at least 2 weeks after discharge or longer if your ankles are swollen. The stockings help blood flow and help reduce swelling in the legs. It is easiest to put the stockings on before you get out of bed in the morning. They should fit snugly.  Pain Control  If a prescription was given for a pain reliever, please follow your doctor's directions.   If the pain is not relieved by your medicine, becomes worse, or you have difficulty breathing, call your surgeon.  Activity  Take frequent rest periods throughout the day.   Wait one week before returning to strenuous activities such as heavy lifting (more than 10 pounds), pushing or pulling.   Talk with your doctor about when you may return to work and your exercise routine.   Do not drive while taking prescription pain medication.  Nutrition  You may resume your normal diet.   Drink plenty of fluids (6-8 glasses a day).   Eat a well-balanced diet.   Call your caregiver for persistent nausea or vomiting.  Elimination Your normal bowel function should return. If constipation should occur, you may:  Take a mild laxative.   Add fruit and bran to your diet.   Drink  more fluids.   Call your doctor if constipation is not relieved.  SEEK IMMEDIATE MEDICAL CARE IF:   You develop chest pain which is not coming from your surgical cut (incision).   You develop shortness of breath or have difficulty breathing.   You develop a temperature over 101 F (38.3 C).   You have a sudden weight gain. Let your caregiver know what the weight gain is.   You develop a rash.   You develop any reaction or side effects to medications given.   You have increased bleeding from wounds.   You see redness, swelling, or have increasing pain in wounds.   You have pus coming from your wound.   You develop lightheadedness or feel faint.  Document Released: 06/08/2005 Document Revised: 08/02/2011 Document Reviewed: 08/30/2005 [facility] Patient Information 2012 [place], [place].

## 2011-10-27 NOTE — Progress Notes (Signed)
Patient c/o feeling tired after room transfer does not want to ambulate.  Will follow up with patient tomorrow.  Gladstone Lighter RN cardiac rehab [id number].

## 2011-10-27 NOTE — Progress Notes (Addendum)
7 Days Post-Op Procedure(s) (LRB): PERMANENT PACEMAKER INSERTION (N/A)  Subjective: Patient feels very tired and "run down".  Objective: Vital signs in last 24 hours: Patient Vitals for the past 24 hrs:  BP Temp Temp src Pulse Resp SpO2 Weight  10/27/11 0501 129/50 mmHg 98.6 F (37 C) Oral 88  20  96 % 227 lb 11.8 oz (103.3 kg)  10/26/11 2028 90/50 mmHg 98.5 F (36.9 C) Oral 83  20  98 % -  10/26/11 1753 105/42 mmHg - - 85  - - -  10/26/11 1536 97/36 mmHg 98.6 F (37 C) Oral 88  20  97 % -  10/26/11 1003 109/53 mmHg - - - - - -   Pre op weight  105 kg Current Weight  10/27/11 227 lb 11.8 oz (103.3 kg)        Intake/Output from previous day: 11/22 0701 - 11/23 0700 In: 1083 [P.O.:1080; I.V.:3] Out: 450 [Urine:450]   Physical Exam:  Cardiovascular: RRR Pulmonary: Slightly decreased at the bases; no rales, wheezes, or rhonchi. Abdomen: Soft, non tender, bowel sounds present. Extremities: Trace bilateral lower extremity edema. Wounds: Clean and dry.  No erythema or signs of infection.  Lab Results: CBC:  Basename 10/27/11 0600  WBC 8.2  HGB 7.9*  HCT 25.3*  PLT 352   BMET:   Basename 10/25/11 0630  NA 141  K 3.9  CL 102  CO2 30  GLUCOSE 115*  BUN 18  CREATININE 0.60  CALCIUM 9.4    PT/INR:   Basename 10/27/11 0600  LABPROT 15.9*  INR 1.24   ABG:  INR: Will add last result for INR, ABG once components are confirmed Will add last 4 CBG results once components are confirmed  Assessment/Plan:  1. CV - SR. Continue Norvasc 5, Benicar 20, and Toprol XL 50 daily.  Also, continue with Coumadin 5 daily. 2.  Pulmonary - Encourage incentive spirometer. 3. Volume Overload - Continue with daily Lasix for diuresis. 4.  Acute blood loss anemia -  H/H this am 7.9/25.3. May benefit from transfusion. Continue with ferrous sulfate. 5.DM-CBGs 126/108/109 . Pre op HGA1C 6.6. Patient was "border line" diet controlled diabetic prior to admission.  Continue  Metformin. Patient will need follow up as an outpatient. 6.CRPI-patient needs to ambulate. 7. Will need SNF upon discharge (likely on Monday).  Ardelle Balls, PA 10/27/2011    Chart reviewed, patient examined, agree with above. Her Hgb is 7.9 and she is generally weak.  I think it would be best to transfuse her.

## 2011-10-28 LAB — CBC
MCHC: 32 g/dL (ref 30.0–36.0)
Platelets: 350 10*3/uL (ref 150–400)
RDW: 14.7 % (ref 11.5–15.5)

## 2011-10-28 LAB — TYPE AND SCREEN
ABO/RH(D): AB POS
Antibody Screen: NEGATIVE

## 2011-10-28 LAB — GLUCOSE, CAPILLARY
Glucose-Capillary: 95 mg/dL (ref 70–99)
Glucose-Capillary: 114 mg/dL — ABNORMAL HIGH (ref 70–99)

## 2011-10-28 LAB — PROTIME-INR
INR: 1.29 (ref 0.00–1.49)
Prothrombin Time: 16.3 seconds — ABNORMAL HIGH (ref 11.6–15.2)

## 2011-10-28 NOTE — Progress Notes (Addendum)
8 Days Post-Op  Procedure(s) (LRB): PERMANENT PACEMAKER INSERTION (N/A) Subjective: Feels better after transfusion. Is having some hip and chest incision discomfort.  Objective  Telemetry NSR/Stach(100)  Temp:  [97.7 F (36.5 C)-99.1 F (37.3 C)] 98.4 F (36.9 C) (11/24 0453) Pulse Rate:  [51-91] 82  (11/24 0453) Resp:  [16-19] 19  (11/24 0453) BP: (96-120)/(46-67) 99/64 mmHg (11/24 0453) SpO2:  [93 %-99 %] 93 % (11/24 0453) Weight:  [236 lb 5.3 oz (107.2 kg)] 236 lb 5.3 oz (107.2 kg) (11/24 0500)   Intake/Output Summary (Last 24 hours) at 10/28/11 0911 Last data filed at 10/27/11 1615  Gross per 24 hour  Intake  555.5 ml  Output      0 ml  Net  555.5 ml       General appearance: alert, cooperative and no distress Heart: regular rate and rhythm Lungs: mildly diminished in the bases Abdomen: soft, non-tender; bowel sounds normal; no masses,  no organomegaly Extremities: no edema Wound: incisions healing well without signs of infection  Lab Results: No results found for this basename: NA:2,K:2,CL:2,CO2:2,GLUCOSE:2,BUN:2,CREATININE:2,CALCIUM:2,MG:2,PHOS:2 in the last 72 hours No results found for this basename: AST:2,ALT:2,ALKPHOS:2,BILITOT:2,PROT:2,ALBUMIN:2 in the last 72 hours No results found for this basename: LIPASE:2,AMYLASE:2 in the last 72 hours  Basename 10/28/11 0710 10/27/11 0600  WBC 9.7 8.2  NEUTROABS -- --  HGB 9.3* 7.9*  HCT 29.1* 25.3*  MCV 89.5 91.3  PLT 350 352   No results found for this basename: CKTOTAL:4,CKMB:4,TROPONINI:4 in the last 72 hours No results found for this basename: POCBNP:3 in the last 72 hours No results found for this basename: DDIMER in the last 72 hours No results found for this basename: HGBA1C in the last 72 hours No results found for this basename: CHOL,HDL,LDLCALC,TRIG,CHOLHDL in the last 72 hours No results found for this basename: TSH,T4TOTAL,FREET3,T3FREE,THYROIDAB in the last 72 hours No results found for this  basename: VITAMINB12,FOLATE,FERRITIN,TIBC,IRON,RETICCTPCT in the last 72 hours  Medications: Scheduled    . amLODipine  5 mg Oral Daily  . cycloSPORINE  1 drop Both Eyes BID  . docusate sodium  200 mg Oral Daily  . ferrous sulfate  325 mg Oral Q breakfast  . furosemide  40 mg Oral Daily  . insulin aspart  0-24 Units Subcutaneous TID AC & HS  . metFORMIN  500 mg Oral BID WC  . metoprolol  50 mg Oral Daily  . multivitamins ther. w/minerals  1 tablet Oral Daily  . olmesartan  20 mg Oral Daily  . oxybutynin  10 mg Oral Daily  . pantoprazole  40 mg Oral QAC breakfast  . polyethylene glycol  17 g Oral Once  . potassium chloride  20 mEq Oral Daily  . povidone-iodine  1 application Topical BID  . simvastatin  20 mg Oral q1800  . sodium chloride  3 mL Intravenous Q12H  . warfarin  5 mg Oral q1800  . warfarin   Does not apply Once     Radiology/Studies:  No results found.  INR: Will add last result for INR, ABG once components are confirmed Will add last 4 CBG results once components are confirmed  Assessment/Plan: S/P Procedure(s) (LRB): PERMANENT PACEMAKER INSERTION (N/A) 1. ABL anemia improved after PRBC's, and feels better. 2. Conts. Gentle diuresis wt up 2# since 11/22 3.  CBG 95-128 range 4. Cont rehab, awaits SNF, probably on Monday. 5. INR 1.29 on coumadin 5 mg daily, cont to monitor  LOS: 11 days    GOLD,WAYNE E 11/24/20129:11 AM  Chart reviewed, patient examined, agree with above.

## 2011-10-28 NOTE — Progress Notes (Signed)
CARDIAC REHAB PHASE I   PRE:  Rate/Rhythm: 85 SR   BP:  Supine:   Sitting: 120/70  Standing:    SaO2: 97 RA  MODE:  Ambulation: 80 ft   POST:  Rate/Rhythem: 99  BP:  Supine:   Sitting: 120/70  Standing:    SaO2: 96 RA 1330-1400 Asisted X one used walker to ambulate. Gait steady with walker. Walked 80 ft. with one sitting rest stop. Pt still tires easily, but is much improved.  Beatrix Fetters

## 2011-10-29 LAB — PROTIME-INR
INR: 1.3 (ref 0.00–1.49)
Prothrombin Time: 16.4 seconds — ABNORMAL HIGH (ref 11.6–15.2)

## 2011-10-29 LAB — GLUCOSE, CAPILLARY: Glucose-Capillary: 96 mg/dL (ref 70–99)

## 2011-10-29 MED ORDER — WARFARIN SODIUM 7.5 MG PO TABS
7.5000 mg | ORAL_TABLET | Freq: Every day | ORAL | Status: DC
  Administered 2011-10-29: 7.5 mg via ORAL
  Filled 2011-10-29 (×3): qty 1

## 2011-10-29 NOTE — Progress Notes (Addendum)
9 Days Post-Op  Procedure(s) (LRB): PERMANENT PACEMAKER INSERTION (N/A) Subjective: Feeling very nauseated this am and has no appetite. She has not vomited.   Objective  Telemetry sinus rhythm  Temp:  [98.3 F (36.8 C)-98.7 F (37.1 C)] 98.7 F (37.1 C) (11/25 0524) Pulse Rate:  [74-85] 74  (11/25 1023) Resp:  [20] 20  (11/25 0524) BP: (107-143)/(68-76) 136/68 mmHg (11/25 1023) SpO2:  [95 %-97 %] 97 % (11/25 0524) Weight:  [223 lb (101.152 kg)] 223 lb (101.152 kg) (11/25 0524)   Intake/Output Summary (Last 24 hours) at 10/29/11 1136 Last data filed at 10/28/11 1700  Gross per 24 hour  Intake    240 ml  Output      0 ml  Net    240 ml       General appearance: alert, cooperative and no distress Heart: regular rate and rhythm, S1, S2 normal, no murmur, click, rub or gallop Lungs: mildly diminished in the bases Abdomen: soft, non-tender; bowel sounds normal; no masses,  no organomegaly Extremities: some pedal edema Wound: incisions healing well  Lab Results: No results found for this basename: NA:2,K:2,CL:2,CO2:2,GLUCOSE:2,BUN:2,CREATININE:2,CALCIUM:2,MG:2,PHOS:2 in the last 72 hours No results found for this basename: AST:2,ALT:2,ALKPHOS:2,BILITOT:2,PROT:2,ALBUMIN:2 in the last 72 hours No results found for this basename: LIPASE:2,AMYLASE:2 in the last 72 hours  Basename 10/28/11 0710 10/27/11 0600  WBC 9.7 8.2  NEUTROABS -- --  HGB 9.3* 7.9*  HCT 29.1* 25.3*  MCV 89.5 91.3  PLT 350 352   No results found for this basename: CKTOTAL:4,CKMB:4,TROPONINI:4 in the last 72 hours No results found for this basename: POCBNP:3 in the last 72 hours No results found for this basename: DDIMER in the last 72 hours No results found for this basename: HGBA1C in the last 72 hours No results found for this basename: CHOL,HDL,LDLCALC,TRIG,CHOLHDL in the last 72 hours No results found for this basename: TSH,T4TOTAL,FREET3,T3FREE,THYROIDAB in the last 72 hours No results found for  this basename: VITAMINB12,FOLATE,FERRITIN,TIBC,IRON,RETICCTPCT in the last 72 hours  Medications: Scheduled    . amLODipine  5 mg Oral Daily  . cycloSPORINE  1 drop Both Eyes BID  . docusate sodium  200 mg Oral Daily  . ferrous sulfate  325 mg Oral Q breakfast  . furosemide  40 mg Oral Daily  . insulin aspart  0-24 Units Subcutaneous TID AC & HS  . metFORMIN  500 mg Oral BID WC  . metoprolol  50 mg Oral Daily  . multivitamins ther. w/minerals  1 tablet Oral Daily  . olmesartan  20 mg Oral Daily  . oxybutynin  10 mg Oral Daily  . pantoprazole  40 mg Oral QAC breakfast  . polyethylene glycol  17 g Oral Once  . potassium chloride  20 mEq Oral Daily  . povidone-iodine  1 application Topical BID  . simvastatin  20 mg Oral q1800  . sodium chloride  3 mL Intravenous Q12H  . warfarin  5 mg Oral q1800  . warfarin   Does not apply Once     Radiology/Studies:  No results found.  INR: Will add last result for INR, ABG once components are confirmed Will add last 4 CBG results once components are confirmed  Assessment/Plan: S/P Procedure(s) (LRB): PERMANENT PACEMAKER INSERTION (N/A)  1. Nausea with fairly benign exam, probably related to medications, observe 2. CBG's well controlled 3. Cont gentle diuresis 4. INR 1.3, will give 7.5 mg of coumadin today 5. Possible transfer to the SNF in am   LOS: 12 days  Michele Schultz,Michele Schultz 11/25/201211:36 AM    Chart reviewed, patient examined, agree with above.

## 2011-10-29 NOTE — Progress Notes (Signed)
Assessed patient's ambulation status. Patient stated that she had walked two times today and she did not want to walk with me tonight. Will continue to monitor.

## 2011-10-30 LAB — GLUCOSE, CAPILLARY
Glucose-Capillary: 102 mg/dL — ABNORMAL HIGH (ref 70–99)
Glucose-Capillary: 121 mg/dL — ABNORMAL HIGH (ref 70–99)

## 2011-10-30 LAB — PROTIME-INR
INR: 1.5 — ABNORMAL HIGH (ref 0.00–1.49)
Prothrombin Time: 18.4 seconds — ABNORMAL HIGH (ref 11.6–15.2)

## 2011-10-30 MED ORDER — WARFARIN SODIUM 5 MG PO TABS: 5.0000 mg | ORAL_TABLET | Freq: Every day | ORAL | Status: DC

## 2011-10-30 NOTE — Progress Notes (Signed)
10 Days Post-Op Procedure(s) (LRB): PERMANENT PACEMAKER INSERTION (N/A)  Subjective: Patient without complaints this am.  Objective: Vital signs in last 24 hours: Patient Vitals for the past 24 hrs:  BP Temp Temp src Pulse Resp SpO2 Weight  10/30/11 0509 121/71 mmHg 98.2 F (36.8 C) Oral 85  18  98 % 225 lb 8 oz (102.286 kg)  10/29/11 2011 122/78 mmHg 98.3 F (36.8 C) Oral 87  18  97 % -  10/29/11 1500 108/64 mmHg 97.9 F (36.6 C) Oral 88  20  97 % -  10/29/11 1023 136/68 mmHg - - 74  - - -   Pre op weight 105  kg Current Weight  10/30/11 225 lb 8 oz (102.286 kg)       Intake/Output from previous day: 11/25 0701 - 11/26 0700 In: 120 [P.O.:120] Out: 750 [Urine:750]   Physical Exam:  Cardiovascular: RRR, no murmurs, gallops, or rubs. Pulmonary: Clear to auscultation bilaterally; no rales, wheezes, or rhonchi. Abdomen: Soft, non tender, bowel sounds present. Extremities: Mild bilateral lower extremity edema. Wounds: Clean and dry.  No erythema or signs of infection.  Lab Results: CBC: Basename 10/28/11 0710  WBC 9.7  HGB 9.3*  HCT 29.1*  PLT 350   BMET: No results found for this basename: NA:2,K:2,CL:2,CO2:2,GLUCOSE:2,BUN:2,CREATININE:2,CALCIUM:2 in the last 72 hours  PT/INR:  Basename 10/30/11 0630  LABPROT 18.4*  INR 1.50*   ABG:  INR: Will add last result for INR, ABG once components are confirmed Will add last 4 CBG results once components are confirmed  Assessment/Plan:  1. CV - Stable. Continue current medications. 2.  Pulmonary - Stable. 3. Volume Overload - Has diuresed well. 4.  Acute blood loss anemia - H/H stable at 9.3/29.1 (given PRBC 11/23). 5.DM-CBGs 99/89/102. Continue current medications. 6.Stable for dischareg to SNF.   Ardelle Balls, PA 10/30/2011

## 2011-10-30 NOTE — Progress Notes (Signed)
[id number] Cardiac Rehab Completed discharge education with pt. She agrees to [facility]. CRP in [place], will send referral.

## 2011-10-30 NOTE — Discharge Summary (Signed)
Agree with note. 

## 2011-10-30 NOTE — Progress Notes (Signed)
Physical Therapy Treatment Patient Details Name: Michele Schultz MRN: [id number] DOB: [birth year] Today's Date: 10/30/2011  PT Assessment/Plan  PT - Assessment/Plan Comments on Treatment Session: Patient is s/p pacemaker and AVR with improved mobility overall.  Continues to progress.  Supposed to D/C to NH today for continued therapy. PT Plan: Discharge plan remains appropriate;Frequency remains appropriate PT Frequency: Min 3X/week Follow Up Recommendations: Skilled nursing facility;24 hour supervision/assistance Equipment Recommended: Defer to next venue PT Goals  Acute Rehab PT Goals PT Goal: Rolling Supine to Left Side - Progress: Other (comment) PT Goal: Supine/Side to Sit - Progress: Other (comment) PT Transfer Goal: Sit to Stand/Stand to Sit - Progress: Other (comment) PT Goal: Ambulate - Progress: Other (comment) PT Goal: Up/Down Stairs - Progress: Other (comment) Additional Goals PT Goal: Additional Goal #1 - Progress: Other (comment) PT Goal: Additional Goal #2 - Progress: Met PT Goal: Additional Goal #3 - Progress: Progressing toward goal  PT Treatment Precautions/Restrictions  Precautions Precautions: Fall;Sternal;ICD/Pacemaker Precaution Comments: Sling D/C'd Required Braces or Orthoses: No Restrictions Weight Bearing Restrictions: No Mobility (including Balance) Bed Mobility Rolling Right: Not tested (comment) Rolling Left: Not tested (comment) Right Sidelying to Sit: Not tested (comment) Left Sidelying to Sit: Not tested (comment) Sitting - Scoot to Edge of Bed: 5: Supervision Sitting - Scoot to [place] of Bed Details (indicate cue type and reason): Needs cues to scoot to EOB to follow sternal precautions Transfers Sit to Stand: 5: Supervision;With upper extremity assist;From chair/3-in-1 Sit to Stand Details (indicate cue type and reason): Pt. still needs cues to place hands on knees. Able to get up from low chair today.   Stand to Sit: 5: Supervision;To  chair/3-in-1;With upper extremity assist Stand to Sit Details: UEs on knees.  Still has difficulty controlling descent. Stand Pivot Transfers: Not tested (comment) Ambulation/Gait Ambulation/Gait Assistance: 5: Supervision Ambulation/Gait Assistance Details (indicate cue type and reason): Pt. needed cues to stay close to RW.  Step length still slightly shortened.  Took several standing rest breaks as well.  Ambulated to the sink and brushed her teeth with supervision while standing there with propping on sink at times.  Then pt. took a seated rest in chair.  Then pt. ambulated to bathroom and had BM and performed toilet transfer and cleaining without help.  Then pt. went back to sink and washed her face and then walked back to chair as she was fatigued at this point.  PT. ambulated a total of 15 feet x 2.   Ambulation Distance (Feet): 30 Feet Assistive device: Rolling walker Gait Pattern: Step-to pattern;Decreased stride length;Shuffle;Antalgic;Trunk flexed Gait velocity: Slow cadence Stairs: No Wheelchair Mobility Wheelchair Mobility: No  Posture/Postural Control Posture/Postural Control: Postural limitations Postural Limitations: continues with flexed posture Balance Balance Assessed: Yes Static Standing Balance Static Standing - Balance Support: Bilateral upper extremity supported Static Standing - Level of Assistance: 5: Stand by assistance Static Standing - Comment/# of Minutes: Stood at sink 3 minutes to brush teeth and then 2 minutes to wash face. Exercise    End of Session PT - End of Session Equipment Utilized During Treatment: Gait belt Activity Tolerance: Patient tolerated treatment well;Patient limited by fatigue Patient left: in chair;with call bell in reach Nurse Communication: Mobility status for ambulation General Behavior During Session: [facility] for tasks performed Cognition: [facility] for tasks performed Cognitive Impairment: Pt. willing to do more everyday.  Her safety is  improving.  Processing much better.  INGOLD,Toccara Alford 10/30/2011, 10:56 AM [facility] [phone] (pager)

## 2011-10-30 NOTE — Progress Notes (Signed)
Patient's IV was removed due to being severely bent and halfway out. The end was intact. Patient is scheduled to go to a SNF today. Will continue to monitor.

## 2011-10-30 NOTE — Progress Notes (Signed)
   CARE MANAGEMENT NOTE 10/30/2011  Patient:  Michele Schultz, Michele Schultz   Account Number:  [id number]  Date Initiated:  10/18/2011  Documentation initiated by:  [facility]  Subjective/Objective Assessment:   Post op AVR and CABG x2.     Action/Plan:   PTA, PT FAIRLY INDEPENDENT, LIVES ALONE.  HER HUSBAND IS IN A SKILLED NURSING FACILITY IN [facility].  PT WILL NEED SHORT TERM SNF FOR REHAB PRIOR TO DC HOME.  WILL CONSULT CSW TO FACILITATE DC TO SNF WHEN STABLE.   Anticipated DC Date:  10/30/2011   Anticipated DC Plan:  SKILLED NURSING FACILITY  In-house referral  Clinical Social Worker      DC Planning Services  CM consult      Choice offered to / List presented to:             Status of service:  Completed, signed off Medicare Important Message given?   (If response is "NO", the following Medicare IM given date fields will be blank) Date Medicare IM given:   Date Additional Medicare IM given:    Discharge Disposition:  SKILLED NURSING FACILITY  Per UR Regulation:  Reviewed for med. necessity/level of care/duration of stay  Comments:  10/30/11 Makinzy Cleere,RN,BSN 1200 PT FOR DISCHARGE TO [facility] TODAY, PER CSW ARRANGEMENTS. Phone #[phone]  10-23-11 11:45am Avie Arenas, RNBSN - [phone] UR Completed.  10-18-11 8:40am Avie Arenas, RNBSN [phone] UR Completed.

## 2011-10-30 NOTE — Progress Notes (Signed)
CSW has coordinated pt transfer to [facility]. Chart is copied and placed in [facility]. Pt nurse to carry pt to SNF by car today. CSW signing off.  Baxter Flattery, MSW [phone]

## 2011-10-31 DIAGNOSIS — E119 Type 2 diabetes mellitus without complications: Secondary | ICD-10-CM

## 2011-10-31 DIAGNOSIS — I10 Essential (primary) hypertension: Secondary | ICD-10-CM

## 2011-10-31 DIAGNOSIS — I359 Nonrheumatic aortic valve disorder, unspecified: Secondary | ICD-10-CM

## 2011-10-31 DIAGNOSIS — I2510 Atherosclerotic heart disease of native coronary artery without angina pectoris: Secondary | ICD-10-CM

## 2011-11-02 LAB — PROTIME-INR
INR: 1.6 — AB (ref 0.9–1.1)
Protime: 19.2 seconds

## 2011-11-03 DIAGNOSIS — Z7901 Long term (current) use of anticoagulants: Secondary | ICD-10-CM

## 2011-11-06 ENCOUNTER — Other Ambulatory Visit: Payer: Self-pay | Admitting: *Deleted

## 2011-11-06 ENCOUNTER — Encounter: Payer: Self-pay | Admitting: Internal Medicine

## 2011-11-06 ENCOUNTER — Ambulatory Visit (INDEPENDENT_AMBULATORY_CARE_PROVIDER_SITE_OTHER): Payer: Medicare Other | Admitting: *Deleted

## 2011-11-06 DIAGNOSIS — I442 Atrioventricular block, complete: Secondary | ICD-10-CM

## 2011-11-06 LAB — PACEMAKER DEVICE OBSERVATION
AL IMPEDENCE PM: 387.5 Ohm
AL THRESHOLD: 1 V
ATRIAL PACING PM: 1
BAMS-0003: 70 {beats}/min
BATTERY VOLTAGE: 3.1584 V
RV LEAD IMPEDENCE PM: 512.5 Ohm

## 2011-11-06 NOTE — Progress Notes (Signed)
Patient here for pacemaker check status post CABG and AVR with heart block post op.  Feels well.  Still at [facility] for rehab.  Breathing okay, still weak with ambulation.  Device interrogation normal,  See PaceArt report for full details.  Patient prefers to be seen in [place] office for transportation reasons.  Will schedule with Dr Graciela Husbands in 3 months.

## 2011-11-06 NOTE — Telephone Encounter (Signed)
It looks like this was RF'd on 10/27/11 by another provider, Gershon Crane, PA.

## 2011-11-06 NOTE — Patient Instructions (Signed)
Patient advised to not lift more than 10 pounds for 4 weeks, limit high arm reaching and reaching behind her for 2 more weeks.    Advised to call the office with redness, swelling, drainage, fever.  Pt verbalized understanding.

## 2011-11-06 NOTE — Telephone Encounter (Signed)
She is currently in rehab at [facility] I will do any refills there

## 2011-11-07 DIAGNOSIS — G589 Mononeuropathy, unspecified: Secondary | ICD-10-CM

## 2011-11-07 DIAGNOSIS — R112 Nausea with vomiting, unspecified: Secondary | ICD-10-CM

## 2011-11-14 ENCOUNTER — Ambulatory Visit (INDEPENDENT_AMBULATORY_CARE_PROVIDER_SITE_OTHER): Payer: Medicare Other | Admitting: Cardiovascular Disease

## 2011-11-14 ENCOUNTER — Encounter: Payer: Self-pay | Admitting: Cardiovascular Disease

## 2011-11-14 DIAGNOSIS — E785 Hyperlipidemia, unspecified: Secondary | ICD-10-CM

## 2011-11-14 DIAGNOSIS — I350 Nonrheumatic aortic (valve) stenosis: Secondary | ICD-10-CM

## 2011-11-14 DIAGNOSIS — Z951 Presence of aortocoronary bypass graft: Secondary | ICD-10-CM | POA: Insufficient documentation

## 2011-11-14 DIAGNOSIS — Z953 Presence of xenogenic heart valve: Secondary | ICD-10-CM | POA: Insufficient documentation

## 2011-11-14 DIAGNOSIS — M549 Dorsalgia, unspecified: Secondary | ICD-10-CM | POA: Insufficient documentation

## 2011-11-14 DIAGNOSIS — I359 Nonrheumatic aortic valve disorder, unspecified: Secondary | ICD-10-CM

## 2011-11-14 DIAGNOSIS — I442 Atrioventricular block, complete: Secondary | ICD-10-CM

## 2011-11-14 DIAGNOSIS — I2510 Atherosclerotic heart disease of native coronary artery without angina pectoris: Secondary | ICD-10-CM

## 2011-11-14 DIAGNOSIS — I10 Essential (primary) hypertension: Secondary | ICD-10-CM

## 2011-11-14 NOTE — Assessment & Plan Note (Signed)
CABG x 2 (SVG to LAD, SVG to OM1)  Continue aggressive medical management.

## 2011-11-14 NOTE — Assessment & Plan Note (Signed)
Blood pressure is well controlled on today's visit. No changes made to the medications. 

## 2011-11-14 NOTE — Assessment & Plan Note (Signed)
She reports back pain radiating around her right hip into her right groin that started yesterday. Uncertain if she has a discitis or other orthopedic issue. We have suggested she rest, talked her physical therapist. If she continues to have pain, she may need evaluation by Dr. Alphonsus Sias.

## 2011-11-14 NOTE — Patient Instructions (Signed)
You are doing well. No medication changes were made.  Please call us if you have new issues that need to be addressed before your next appt.  The office will contact you for a follow up Appt. In 6 weeks

## 2011-11-14 NOTE — Assessment & Plan Note (Signed)
Postoperative heart block with basement of pacemaker. She will followup with  EP.

## 2011-11-14 NOTE — Progress Notes (Signed)
Patient ID: Michele Schultz, female    DOB: Mar 13, 1931, 77 y.o.   MRN: [id number]  HPI Comments: Michele Schultz Is a very pleasant 77 year old woman, patient of Dr. Daniel Nones,  former nurse, with a history of hypertension, diabetes, hyperlipidemia, coronary artery disease with  mid left main and  ostial circumflex disease 08/2009, severe aortic valve stenosis, chronic fatigue, Who presents for followup after CABG x 2 (SVG to LAD, SVG to OM1) with EVH right thigh;aortic valve replacement (using a Magna Ease pericardial tissue valve size 21 mm.) She had postoperative complete heart block requiring pacemaker placement. She did have postoperative anemia and difficulty mobilizing.   She presents today almost one month from her surgery and reports that she is doing well and getting stronger. She is no longer on Lasix as of the beginning of December and denies any lower extremity edema, shortness of breath. She does have some soreness to her breasts bilaterally. She continues to walk with a walker though again is becoming stronger. She denies any lightheadedness, dizziness. She is happy with the rehabilitation at [facility].   She has chronic back pain, chronic knee pain and hip pain. She has had a knee and hip replacement. She has had problems with fatigue.   EKG shows normal sinus rhythm with rate 84 beats per minute with Right bundle branch block   Outpatient Encounter Prescriptions as of 11/14/2011  Medication Sig Dispense Refill  . amLODipine (NORVASC) 5 MG tablet Take 1 tablet (5 mg total) by mouth daily.  30 tablet  6  . aspirin 81 MG tablet Take 81 mg by mouth daily.        Marland Kitchen atorvastatin (LIPITOR) 40 MG tablet Take 40 mg by mouth daily.        . Cholecalciferol (VITAMIN D) 2000 UNITS CAPS Take 1 capsule by mouth daily.        . Cyanocobalamin (VITAMIN B-12) 1000 MCG/15ML LIQD Inject 1,000 mcg as directed every 30 (thirty) days.        . cycloSPORINE (RESTASIS) 0.05 % ophthalmic emulsion Place 1  drop into both eyes 2 (two) times daily.        . Ferrous Sulfate Dried (SLOW RELEASE IRON) 140 (45 FE) MG TBCR Take 1 tablet by mouth daily.        Marland Kitchen gabapentin (NEURONTIN) 100 MG capsule Take 100 mg by mouth 3 (three) times daily.        . metFORMIN (GLUCOPHAGE) 500 MG tablet Take 1 tablet (500 mg total) by mouth 2 (two) times daily with a meal.  60 tablet  1  . metoprolol (TOPROL-XL) 50 MG 24 hr tablet Take 50 mg by mouth daily.        Marland Kitchen olmesartan (BENICAR) 20 MG tablet Take 1 tablet (20 mg total) by mouth daily.  30 tablet  0  . oxybutynin (DITROPAN-XL) 10 MG 24 hr tablet Take 10 mg by mouth daily.        Marland Kitchen oxycodone (OXY-IR) 5 MG capsule Take 5 mg by mouth every 4 (four) hours as needed.        . pantoprazole (PROTONIX) 40 MG tablet Take 40 mg by mouth 2 (two) times daily.        . promethazine (PHENERGAN) 25 MG tablet Take 25 mg by mouth every 6 (six) hours as needed.        . warfarin (COUMADIN) 5 MG tablet 7 mg on Mon-Wed- Friday with 6 mg on Tues. Thurs. Sat. & Sun.  Or as directed by Dr. Mariah Milling       . furosemide (LASIX) 40 MG tablet Take 1 tablet (40 mg total) by mouth daily.  7 tablet  0  . potassium chloride SA (K-DUR,KLOR-CON) 20 MEQ tablet Take 1 tablet (20 mEq total) by mouth daily.  7 tablet  0    Review of Systems  HENT: Negative.   Eyes: Negative.   Cardiovascular: Positive for chest pain.  Gastrointestinal: Negative.   Musculoskeletal: Positive for back pain, arthralgias and gait problem.  Skin: Negative.   Neurological: Negative.   Hematological: Negative.   Psychiatric/Behavioral: Negative.   All other systems reviewed and are negative.   BP 120/78  Pulse 84  Ht 5\' 4"  (1.626 m)  Wt 230 lb (104.327 kg)  BMI 39.48 kg/m2  Physical Exam  Nursing note and vitals reviewed. Constitutional: She is oriented to person, place, and time. She appears well-developed and well-nourished.       Obese  HENT:  Head: Normocephalic.  Nose: Nose normal.  Mouth/Throat:  Oropharynx is clear and moist.  Eyes: Conjunctivae are normal. Pupils are equal, round, and reactive to light.  Neck: Normal range of motion. Neck supple. No JVD present.  Cardiovascular: Normal rate, regular rhythm, S1 normal, S2 normal and intact distal pulses.  Exam reveals no gallop and no friction rub.   Murmur heard.  Systolic murmur is present with a grade of 2/6       Well-healed midline incision  Pulmonary/Chest: Effort normal and breath sounds normal. No respiratory distress. She has no wheezes. She has no rales. She exhibits no tenderness.  Abdominal: Soft. Bowel sounds are normal. She exhibits no distension. There is no tenderness.  Musculoskeletal: Normal range of motion. She exhibits no edema and no tenderness.  Lymphadenopathy:    She has no cervical adenopathy.  Neurological: She is alert and oriented to person, place, and time. Coordination normal.  Skin: Skin is warm and dry. No rash noted. No erythema.  Psychiatric: She has a normal mood and affect. Her behavior is normal. Judgment and thought content normal.         Assessment and Plan

## 2011-11-14 NOTE — Assessment & Plan Note (Signed)
We will set her up for an echocardiogram some point in followup in 2013 for baseline. She otherwise appears to be doing well and has improved shortness of breath.

## 2011-11-14 NOTE — Assessment & Plan Note (Signed)
We have suggested she stay on her Lipitor. Goal LDL less than 70.

## 2011-11-15 DIAGNOSIS — R1084 Generalized abdominal pain: Secondary | ICD-10-CM

## 2011-11-15 DIAGNOSIS — R5383 Other fatigue: Secondary | ICD-10-CM

## 2011-11-15 DIAGNOSIS — R5381 Other malaise: Secondary | ICD-10-CM

## 2011-11-16 ENCOUNTER — Emergency Department: Payer: Medicare Other | Admitting: *Deleted

## 2011-11-16 DIAGNOSIS — R109 Unspecified abdominal pain: Secondary | ICD-10-CM

## 2011-11-17 DIAGNOSIS — Z7901 Long term (current) use of anticoagulants: Secondary | ICD-10-CM

## 2011-11-20 ENCOUNTER — Other Ambulatory Visit: Payer: Self-pay | Admitting: Thoracic Surgery (Cardiothoracic Vascular Surgery)

## 2011-11-20 DIAGNOSIS — I2510 Atherosclerotic heart disease of native coronary artery without angina pectoris: Secondary | ICD-10-CM

## 2011-11-22 ENCOUNTER — Ambulatory Visit (INDEPENDENT_AMBULATORY_CARE_PROVIDER_SITE_OTHER): Payer: Self-pay | Admitting: Thoracic Surgery (Cardiothoracic Vascular Surgery)

## 2011-11-22 ENCOUNTER — Encounter: Payer: Self-pay | Admitting: Thoracic Surgery (Cardiothoracic Vascular Surgery)

## 2011-11-22 ENCOUNTER — Ambulatory Visit
Admission: RE | Admit: 2011-11-22 | Discharge: 2011-11-22 | Disposition: A | Discharge status: Home / Self Care | Payer: Medicare Other | Source: Ambulatory Visit | Attending: Thoracic Surgery (Cardiothoracic Vascular Surgery) | Admitting: Thoracic Surgery (Cardiothoracic Vascular Surgery)

## 2011-11-22 VITALS — BP 149/78 | HR 75 | Resp 20 | Ht 64.0 in | Wt 230.0 lb

## 2011-11-22 DIAGNOSIS — Z951 Presence of aortocoronary bypass graft: Secondary | ICD-10-CM

## 2011-11-22 DIAGNOSIS — I2510 Atherosclerotic heart disease of native coronary artery without angina pectoris: Secondary | ICD-10-CM

## 2011-11-22 DIAGNOSIS — Z954 Presence of other heart-valve replacement: Secondary | ICD-10-CM

## 2011-11-22 DIAGNOSIS — I359 Nonrheumatic aortic valve disorder, unspecified: Secondary | ICD-10-CM

## 2011-11-22 DIAGNOSIS — Z952 Presence of prosthetic heart valve: Secondary | ICD-10-CM

## 2011-11-22 NOTE — Progress Notes (Signed)
HPI:  Michele Schultz is returns today for followup after having aortic valve replacement with a pericardial valve and coronary bypass grafting x2 on November 13. Postoperative course was complicated initially by heart block showed pacemaker put in on November 16. After that she went back into sinus rhythm and remained in sinus rhythm throughout her stay. She says overall she's been doing well with her rehabilitation. She did have an episode about 5 days ago she had a good emergency room for a severe stabbing shooting pain in her right hip down her thigh to her foot. She says this was the worst pain she never expressed in her life. She was treated with steroids (prednisone 50 mg daily) and nonsteroidal anti-inflammatories. She says that pain has improved somewhat since that episode. She has moderate incisional discomfort in her breasts and along the sternal incision itself, that is well controlled with medications.   Current Outpatient Prescriptions  Medication Sig Dispense Refill  . amLODipine (NORVASC) 5 MG tablet Take 1 tablet (5 mg total) by mouth daily.  30 tablet  6  . aspirin 81 MG tablet Take 81 mg by mouth daily.        Marland Kitchen atorvastatin (LIPITOR) 40 MG tablet Take 40 mg by mouth daily.        . Cyanocobalamin (VITAMIN B-12) 1000 MCG/15ML LIQD Inject 1,000 mcg as directed every 30 (thirty) days.        . cycloSPORINE (RESTASIS) 0.05 % ophthalmic emulsion Place 1 drop into both eyes 2 (two) times daily.       . diazepam (VALIUM) 5 MG tablet Take 5 mg by mouth every 6 (six) hours as needed.        . docusate sodium (COLACE) 100 MG capsule Take 100 mg by mouth 2 (two) times daily.        . Ferrous Sulfate Dried (SLOW RELEASE IRON) 140 (45 FE) MG TBCR Take 1 tablet by mouth daily.        Marland Kitchen gabapentin (NEURONTIN) 100 MG capsule Take 100 mg by mouth 3 (three) times daily.       . metFORMIN (GLUCOPHAGE) 500 MG tablet Take 1 tablet (500 mg total) by mouth 2 (two) times daily with a meal.  60 tablet  1    . metoprolol (TOPROL-XL) 50 MG 24 hr tablet Take 50 mg by mouth daily.        Marland Kitchen olmesartan (BENICAR) 20 MG tablet Take 1 tablet (20 mg total) by mouth daily.  30 tablet  0  . oxybutynin (DITROPAN-XL) 10 MG 24 hr tablet Take 10 mg by mouth daily.        Marland Kitchen oxycodone (OXY-IR) 5 MG capsule Take 5 mg by mouth every 4 (four) hours as needed.        . pantoprazole (PROTONIX) 40 MG tablet Take 40 mg by mouth 2 (two) times daily.        . promethazine (PHENERGAN) 25 MG tablet Take 25 mg by mouth every 6 (six) hours as needed.        . warfarin (COUMADIN) 5 MG tablet 7 mg on Mon-Wed- Friday with 6 mg on Tues. Thurs. Sat. & Sun. Or as directed by Dr. Mariah Milling       . Cholecalciferol (VITAMIN D) 2000 UNITS CAPS Take 1 capsule by mouth daily.          Physical Exam BP 149/78  Pulse 75  Resp 20  Ht 5\' 4"  (1.626 m)  Wt 230 lb (104.327 kg)  BMI 39.48 kg/m2  SpO2 97% Gen. obese 77 year old woman in no acute distress Neurologic alert and oriented x3, no focal deficits Lungs no stress sounds at both bases otherwise clear Cardiac regular rate and rhythm, 2/6 systolic murmur heard throughout the precordium Sternum stable, incision clean and intact Leg incision healing well, trace peripheral edema  Diagnostic Tests: Chest x-ray shows improved aeration of the lung bases, no significant effusion  Impression: 77 year old woman status post aVR CABG x2, overall doing well at this point in time. She does have some physical limitations, and did have a setback with this episode of what sounds like sciatica. From cardiac standpoint she appears to be stable. She is on Coumadin short term and that is being monitored level of our Coumadin clinic.  She should not lift any objects greater than 10 pounds for which another 2 weeks. Beyond that there are no definite restrictions on her from a cardiac surgical standpoint, but there may be some issues related to her sciatica.   Plan:  She will continue to be followed by  Dr. Julien Nordmann for her cardiology needs.  I would be happy to see her back any time that can be of any further assistance with her care.

## 2011-11-24 DIAGNOSIS — Z7902 Long term (current) use of antithrombotics/antiplatelets: Secondary | ICD-10-CM

## 2011-11-27 ENCOUNTER — Telehealth: Payer: Self-pay | Admitting: Internal Medicine

## 2011-11-27 ENCOUNTER — Inpatient Hospital Stay: Payer: Medicare Other | Admitting: Internal Medicine

## 2011-11-27 NOTE — Telephone Encounter (Signed)
Triage Record Num: [id number] Operator: Patriciaann Clan Patient Name: Michele Schultz Call Date & Time: 11/26/2011 6:55:17PM Patient Phone: [phone] PCP: Tillman Abide Patient Gender: Female PCP Fax : [phone] Patient DOB: November 22, 1931 Practice Name: [facility] Reason for Call: Caller: Laura/LPN valling from [facility]; PCP: Tillman Abide I.; CB#: [phone]; States patient developed chest pain, fever, chills, generalized aching, shortness of breath, wheezing. Onset 1800 11/26/11. States temp. 102 axillary pulse 108, resp. 26. BP 150/90, O2 Sat 93 percent on room. States patient was transported to [facility] ED via EMS. Protocol(s) Used: Office Note Recommended Outcome per Protocol: Information Noted and Sent to Office Reason for Outcome: Caller information to office Care Advice: ~ 12/

## 2011-11-27 NOTE — Telephone Encounter (Signed)
Admitted to [facility]

## 2011-12-04 DIAGNOSIS — R05 Cough: Secondary | ICD-10-CM

## 2011-12-06 DIAGNOSIS — Z7901 Long term (current) use of anticoagulants: Secondary | ICD-10-CM

## 2011-12-12 DIAGNOSIS — M48061 Spinal stenosis, lumbar region without neurogenic claudication: Secondary | ICD-10-CM

## 2011-12-12 DIAGNOSIS — R079 Chest pain, unspecified: Secondary | ICD-10-CM

## 2012-01-03 ENCOUNTER — Encounter: Payer: Self-pay | Admitting: Cardiovascular Disease

## 2012-01-03 ENCOUNTER — Ambulatory Visit (INDEPENDENT_AMBULATORY_CARE_PROVIDER_SITE_OTHER): Payer: Medicare Other | Admitting: Emergency Medicine

## 2012-01-03 ENCOUNTER — Ambulatory Visit (INDEPENDENT_AMBULATORY_CARE_PROVIDER_SITE_OTHER): Payer: Medicare Other | Admitting: Cardiovascular Disease

## 2012-01-03 DIAGNOSIS — I359 Nonrheumatic aortic valve disorder, unspecified: Secondary | ICD-10-CM

## 2012-01-03 DIAGNOSIS — M549 Dorsalgia, unspecified: Secondary | ICD-10-CM

## 2012-01-03 DIAGNOSIS — I350 Nonrheumatic aortic (valve) stenosis: Secondary | ICD-10-CM

## 2012-01-03 DIAGNOSIS — Z7901 Long term (current) use of anticoagulants: Secondary | ICD-10-CM | POA: Insufficient documentation

## 2012-01-03 DIAGNOSIS — I10 Essential (primary) hypertension: Secondary | ICD-10-CM

## 2012-01-03 DIAGNOSIS — Z954 Presence of other heart-valve replacement: Secondary | ICD-10-CM

## 2012-01-03 DIAGNOSIS — Z951 Presence of aortocoronary bypass graft: Secondary | ICD-10-CM

## 2012-01-03 DIAGNOSIS — Z953 Presence of xenogenic heart valve: Secondary | ICD-10-CM

## 2012-01-03 DIAGNOSIS — I442 Atrioventricular block, complete: Secondary | ICD-10-CM

## 2012-01-03 LAB — POCT INR: INR: 2.7

## 2012-01-03 MED ORDER — LOSARTAN POTASSIUM 25 MG PO TABS: 25.0000 mg | ORAL_TABLET | Freq: Every day | ORAL | Status: DC

## 2012-01-03 NOTE — Patient Instructions (Addendum)
You are doing well. Please stop benicar Start losartan 25 mg daily Restart toprol 50 mg daily (metoprolol succinate) You do not need potassium (only taken with lasix if needed for leg swelling)  Please call us if you have new issues that need to be addressed before your next appt.  Your physician wants you to follow-up in: 6 months.  You will receive a reminder letter in the mail two months in advance. If you don't receive a letter, please call our office to schedule the follow-up appointment.

## 2012-01-03 NOTE — Assessment & Plan Note (Signed)
S/p pacer, followed by EP.

## 2012-01-03 NOTE — Assessment & Plan Note (Signed)
Currently with no symptoms of angina. No further workup at this time. Continue current medication regimen. 

## 2012-01-03 NOTE — Assessment & Plan Note (Addendum)
Pericardial tissue Valve replacement for severe aortic stenosis. I believe we might be able to stop her warfarin as it has been close to 3 months. We'll confirm this with cardiothoracic surgery prior to stopping.

## 2012-01-03 NOTE — Assessment & Plan Note (Signed)
Blood pressure is reasonable on today's visit though she does have some medication questions and confusion. We will stop the Benicar as she does not have his medication and start losartan 25 mg daily. She does not need potassium on a regular basis as she no longer takes Lasix. This was one of her questions. We will also continue the metoprolol succinate 50 mg daily. There have been some confusion about this with the nurses No other medication changes were made.

## 2012-01-03 NOTE — Assessment & Plan Note (Signed)
She has chronic back pain with what sounds like sciatic pain down her right leg to her foot.

## 2012-01-03 NOTE — Assessment & Plan Note (Signed)
We'll discuss the length of her Coumadin treatment with CT surgery. My understanding is that she will be able to stop this.

## 2012-01-03 NOTE — Progress Notes (Signed)
Patient ID: NANCYLEE GAINES, female    DOB: February 25, 1931, 78 y.o.   MRN: [id number]  HPI Comments: Ms. 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 She did have postoperative anemia and difficulty mobilizing.   She presents today almost three months from her surgery and reports that she is doing well and getting stronger. She denies any lower extremity edema, shortness of breath.  She continues to walk with a walker. She denies any lightheadedness, dizziness. She had rehabilitation at [facility]. She is somewhat irritated by the frequent medication changes and the difficulty that her nurses have had managing her medications at home. She is requesting an order for a nursing aide to help her with bathing and other ADLs as she is still weak. Per her report, she would like to stay in rehabilitation for longer but this was not possible through Medicare.  She has chronic back pain, chronic knee pain and hip pain. She has had a knee and hip replacement.   Previous EKG shows normal sinus rhythm with rate 84 beats per minute with Right bundle branch block   Outpatient Encounter Prescriptions as of 01/03/2012  Medication Sig Dispense Refill  . amLODipine (NORVASC) 5 MG tablet Take 1 tablet (5 mg total) by mouth daily.  30 tablet  6  . aspirin 81 MG tablet Take 81 mg by mouth daily.        Marland Kitchen atorvastatin (LIPITOR) 40 MG tablet Take 40 mg by mouth daily.        Marland Kitchen azelastine (ASTELIN) 137 MCG/SPRAY nasal spray Place 1 spray into  the nose 2 (two) times daily. Use in each nostril as directed      . benzonatate (TESSALON) 100 MG capsule Take 200 mg by mouth 3 (three) times daily as needed.      . Cholecalciferol (VITAMIN D) 2000 UNITS CAPS Take 1 capsule by mouth daily.        . Cyanocobalamin (VITAMIN B-12) 1000 MCG/15ML LIQD Inject 1,000 mcg as directed every 30 (thirty) days.        . cycloSPORINE (RESTASIS) 0.05 % ophthalmic emulsion Place 1 drop into both eyes 2 (two) times daily.       . diazepam (VALIUM) 5 MG tablet Take 5 mg by mouth every 6 (six) hours as needed.        . docusate sodium (COLACE) 100 MG capsule Take 100 mg by mouth 2 (two) times daily.        Marland Kitchen gabapentin (NEURONTIN) 100 MG capsule Take two tablets by mouth at bedtime.      Marland Kitchen HYDROcodone-acetaminophen (NORCO) 5-325 MG per tablet Take 2 tablets by mouth every 4 (four) hours as needed.      . magnesium hydroxide (MILK OF MAGNESIA) 400 MG/5ML suspension Take by mouth daily as needed.      . metFORMIN (GLUCOPHAGE) 500 MG tablet Take 1 tablet (500 mg total) by mouth 2 (two) times daily with a meal.  60 tablet  1  . metoprolol (TOPROL-XL) 50 MG 24 hr tablet Take 50  mg by mouth daily.        Marland Kitchen oxycodone (OXY-IR) 5 MG capsule Take 10 mg by mouth daily.       . pantoprazole (PROTONIX) 40 MG tablet Take 40 mg by mouth 2 (two) times daily.        Marland Kitchen warfarin (COUMADIN) 5 MG tablet 7 mg on Mon-Wed- Friday with 6 mg on Tues. Thurs. Sat. & Sun. Or as directed by Dr. Mariah Milling       . olmesartan (BENICAR) 20 MG tablet Take 1 tablet (20 mg total) by mouth daily. SHE DOES NOT HAVE THIS  30 tablet  0     Review of Systems  HENT: Negative.   Eyes: Negative.   Gastrointestinal: Negative.   Musculoskeletal: Positive for back pain, arthralgias and gait problem.  Skin: Negative.   Neurological: Negative.   Hematological: Negative.   Psychiatric/Behavioral: Negative.   All other systems reviewed and are negative.   BP 144/84  Pulse 91  Ht 5\' 4"  (1.626 m)  Wt 230  lb (104.327 kg)  BMI 39.48 kg/m2  Physical Exam  Nursing note and vitals reviewed. Constitutional: She is oriented to person, place, and time. She appears well-developed and well-nourished.       Obese  HENT:  Head: Normocephalic.  Nose: Nose normal.  Mouth/Throat: Oropharynx is clear and moist.  Eyes: Conjunctivae are normal. Pupils are equal, round, and reactive to light.  Neck: Normal range of motion. Neck supple. No JVD present.  Cardiovascular: Normal rate, regular rhythm, S1 normal, S2 normal and intact distal pulses.  Exam reveals no gallop and no friction rub.   Murmur heard.  Systolic murmur is present with a grade of 2/6       Well-healed midline incision  Pulmonary/Chest: Effort normal and breath sounds normal. No respiratory distress. She has no wheezes. She has no rales. She exhibits no tenderness.  Abdominal: Soft. Bowel sounds are normal. She exhibits no distension. There is no tenderness.  Musculoskeletal: Normal range of motion. She exhibits no edema and no tenderness.  Lymphadenopathy:    She has no cervical adenopathy.  Neurological: She is alert and oriented to person, place, and time. Coordination normal.  Skin: Skin is warm and dry. No rash noted. No erythema.  Psychiatric: She has a normal mood and affect. Her behavior is normal. Judgment and thought content normal.         Assessment and Plan

## 2012-01-08 ENCOUNTER — Telehealth: Payer: Self-pay

## 2012-01-08 NOTE — Telephone Encounter (Signed)
Notified patient's nephew to have patient stop the warfarin. Told the nephew to have her nurse call the office if has any questions regarding this. The nephew understands and will have patient stop the warfarin per Dr. Windell Hummingbird request.

## 2012-01-08 NOTE — Telephone Encounter (Signed)
Message copied by Festus Aloe on Mon Jan 08, 2012  3:58 PM ------      Message from: Phoebe Sharps      Created: Sun Jan 07, 2012  9:53 PM      Regarding: FW: warfarin       Elam Dutch you mind calling Ms. Jari Pigg.      She does not need warfarin any more.      We may need to let her nurse know (visiting home nurse)      thx      Jorja Loa            ----- Message -----         From: [name], MD         Sent: 01/05/2012   7:31 AM           To: Antonieta Iba, MD      Subject: RE: warfarin                                             Hey Tim,            That shouldn't be a problem, she doesn't need it any more.            Brett Canales      ----- Message -----         From: Antonieta Iba, MD         Sent: 01/03/2012   5:10 PM           To: [name], MD      Subject: warfarin                                                 Eldridge Scot,      Are we ok to hold the warfarin on Rozell Searing?      She had pericardial AVR 10/12/2011, CABG x 2      She is doing well.      thx      Jorja Loa

## 2012-01-08 NOTE — Telephone Encounter (Signed)
Notified patient need to stop warfarin per Dr. Windell Hummingbird request.  She also wanted to know who sent in the Rx for losartan. Told the patient the losartan was sent by Dr. Mariah Milling at her office visit on 01/03/2012.

## 2012-01-09 ENCOUNTER — Ambulatory Visit: Payer: Self-pay | Admitting: Cardiovascular Disease

## 2012-01-09 DIAGNOSIS — I350 Nonrheumatic aortic (valve) stenosis: Secondary | ICD-10-CM

## 2012-01-09 DIAGNOSIS — Z953 Presence of xenogenic heart valve: Secondary | ICD-10-CM

## 2012-01-09 DIAGNOSIS — Z7901 Long term (current) use of anticoagulants: Secondary | ICD-10-CM

## 2012-01-09 NOTE — Telephone Encounter (Signed)
This encounter was created in error - please disregard.

## 2012-01-18 ENCOUNTER — Other Ambulatory Visit: Payer: Self-pay | Admitting: Internal Medicine

## 2012-01-30 ENCOUNTER — Ambulatory Visit: Payer: Medicare Other | Admitting: Internal Medicine

## 2012-02-09 ENCOUNTER — Encounter: Payer: Self-pay | Admitting: Internal Medicine

## 2012-02-09 ENCOUNTER — Ambulatory Visit (INDEPENDENT_AMBULATORY_CARE_PROVIDER_SITE_OTHER): Payer: Medicare Other | Admitting: Internal Medicine

## 2012-02-09 DIAGNOSIS — I5032 Chronic diastolic (congestive) heart failure: Secondary | ICD-10-CM

## 2012-02-09 DIAGNOSIS — I4891 Unspecified atrial fibrillation: Secondary | ICD-10-CM

## 2012-02-09 DIAGNOSIS — I509 Heart failure, unspecified: Secondary | ICD-10-CM

## 2012-02-09 DIAGNOSIS — Z950 Presence of cardiac pacemaker: Secondary | ICD-10-CM

## 2012-02-09 DIAGNOSIS — I2510 Atherosclerotic heart disease of native coronary artery without angina pectoris: Secondary | ICD-10-CM

## 2012-02-09 DIAGNOSIS — I442 Atrioventricular block, complete: Secondary | ICD-10-CM

## 2012-02-09 LAB — PACEMAKER DEVICE OBSERVATION
AL AMPLITUDE: 1.2 mv
BAMS-0001: 180 {beats}/min
DEVICE MODEL PM: 7284703
RV LEAD AMPLITUDE: 11.1 mv
RV LEAD IMPEDENCE PM: 462.5 Ohm
RV LEAD THRESHOLD: 1.25 V
VENTRICULAR PACING PM: 1.1

## 2012-02-09 NOTE — Assessment & Plan Note (Signed)
reveied with TCTS, she can resume driving

## 2012-02-09 NOTE — Assessment & Plan Note (Signed)
The patient's device was interrogated and the information was fully reviewed.  The device was reprogrammed to maximize longeviety  Will begin q73month remote monitoring as she has had short<4min runs of afib  She has a high CHASDSVASC score >6  wuold need anticoagulation

## 2012-02-09 NOTE — Assessment & Plan Note (Signed)
Intermittent now with AV conduction

## 2012-02-09 NOTE — Assessment & Plan Note (Signed)
Stable on current meds 

## 2012-02-09 NOTE — Assessment & Plan Note (Signed)
As above.

## 2012-02-09 NOTE — Progress Notes (Signed)
HPI  Michele Schultz is a 78 y.o. female Seen in followup for pacemaker implanted for complete heart block following aortic valve replacement. The patient has a history of coronary artery grafting and aortic valve replacement in November 2012 with a bioprostheticvalve  She has chronic dyspnea and pain  Past Medical History  Diagnosis Date  . Hypertension   . Anemia   . Neuralgia, neuritis, and radiculitis, unspecified   . Coronary artery disease     s/p CABG 11/12  . Aortic valve disorders     s/p AVR for severe AS 11/12  . Onychia and paronychia of toe   . Osteoarthrosis, unspecified whether generalized or localized, pelvic region and thigh     mainly in her back and knees  . Enthesopathy of hip region   . Esophageal reflux     followed by Dr.Seigal. stabilized with a combination of Nexium and Zantac  . Knee joint replacement by other means   . Stress incontinence, female     followed by Dr.Cope  . Atrioventricular block, complete   . Cardiac pacemaker -st Judes     11/12  . Chronic diastolic heart failure     ef 45%WUJW 11/12  . Type II or unspecified type diabetes mellitus without mention of complication, not stated as uncontrolled     pt. reports that she is borderline   . Spinal stenosis     Past Surgical History  Procedure Date  . Replacement total knee 11/2006    right knee  . Nasal sinus surgery 2009  . Total hip arthroplasty 09/2010  . Cardiac catheterization 08/2009    50% stenosis distal left main, 50% stenosis ostial left circumflex.   . Cardiac catheterization     at [facility]  . Eye surgery     IOL/ after cataracts removed- bilateral   . Aortic valve replacement 10/17/2011    Procedure: AORTIC VALVE REPLACEMENT (AVR);  Surgeon: [name], MD;  Location: [facility] OR;  Service: Open Heart Surgery;  Laterality: N/A;  . Coronary artery bypass graft 10/17/2011    Procedure: CORONARY ARTERY BYPASS GRAFTING (CABG);  Surgeon: [name], MD;   Location[facility] OR;  Service: Open Heart Surgery;  Laterality: N/A;  Times Two, using right leg greater saphenous vein harvested endoscopically    Current Outpatient Prescriptions  Medication Sig Dispense Refill  . amLODipine (NORVASC) 5 MG tablet Take 1 tablet (5 mg total) by mouth daily.  30 tablet  6  . aspirin 81 MG tablet Take 81 mg by mouth daily.        Marland Kitchen atorvastatin (LIPITOR) 40 MG tablet Take 40 mg by mouth daily.        Marland Kitchen azelastine (ASTELIN) 137 MCG/SPRAY nasal spray Place 1 spray into the nose 2 (two) times daily. Use in each nostril as directed      . benzonatate (TESSALON) 100 MG capsule Take 200 mg by mouth 3 (three) times daily as needed.      . Cholecalciferol (VITAMIN D) 2000 UNITS CAPS Take 1 capsule by mouth daily.        . Cyanocobalamin (VITAMIN B-12) 1000 MCG/15ML LIQD Inject 1,000 mcg as directed every 30 (thirty) days.        . cycloSPORINE (RESTASIS) 0.05 % ophthalmic emulsion Place 1 drop into both eyes 2 (two) times daily.       . diazepam (VALIUM) 5 MG tablet Take 5 mg by mouth every 6 (six) hours as needed.        Marland Kitchen  docusate sodium (COLACE) 100 MG capsule Take 100 mg by mouth 2 (two) times daily.        Marland Kitchen gabapentin (NEURONTIN) 100 MG capsule Take two tablets by mouth at bedtime.      Marland Kitchen HYDROcodone-acetaminophen (NORCO) 5-325 MG per tablet Take 2 tablets by mouth every 4 (four) hours as needed.      Marland Kitchen losartan (COZAAR) 25 MG tablet Take 1 tablet (25 mg total) by mouth daily.  25 tablet  90  . magnesium hydroxide (MILK OF MAGNESIA) 400 MG/5ML suspension Take by mouth daily as needed.      . metFORMIN (GLUCOPHAGE) 500 MG tablet Take 1 tablet (500 mg total) by mouth 2 (two) times daily with a meal.  60 tablet  1  . metoprolol (TOPROL-XL) 50 MG 24 hr tablet Take 50 mg by mouth daily.        Marland Kitchen oxycodone (OXY-IR) 5 MG capsule Take 10 mg by mouth daily.       . pantoprazole (PROTONIX) 40 MG tablet Take 40 mg by mouth 2 (two) times daily.          Allergies  Allergen  Reactions  . Citalopram     Altered mental status  . Cymbalta (Duloxetine Hcl)     Caused sedation  . Imipramine   . Proton Pump Inhibitors     Review of Systems negative except from HPI and PMH  Physical Exam BP 129/77  Pulse 74  Ht 5\' 4"  (1.626 m)  Wt 224 lb (101.606 kg)  BMI 38.45 kg/m2  SpO2 100% Well developed and well nourished  Obese  in no acute distress HENT normal E scleral and icterus clear Neck Supple JVP flat;  Device pocket well healed Clear to ausculation *Regular rate and rhythm, no murmurs gallops or rub Soft with active bowel sounds No clubbing cyanosis none Edema Alert and oriented, grossly normal motor and sensory function Skin Warm and Dry   Assessment and  Plan

## 2012-02-26 ENCOUNTER — Other Ambulatory Visit: Payer: Medicare Other

## 2012-03-05 ENCOUNTER — Ambulatory Visit (INDEPENDENT_AMBULATORY_CARE_PROVIDER_SITE_OTHER): Payer: Medicare Other | Admitting: Cardiovascular Disease

## 2012-03-05 ENCOUNTER — Encounter: Payer: Self-pay | Admitting: Cardiovascular Disease

## 2012-03-05 VITALS — BP 116/72 | HR 70 | Ht 64.0 in | Wt 222.0 lb

## 2012-03-05 DIAGNOSIS — I4891 Unspecified atrial fibrillation: Secondary | ICD-10-CM

## 2012-03-05 DIAGNOSIS — Z954 Presence of other heart-valve replacement: Secondary | ICD-10-CM

## 2012-03-05 DIAGNOSIS — K5900 Constipation, unspecified: Secondary | ICD-10-CM

## 2012-03-05 DIAGNOSIS — E785 Hyperlipidemia, unspecified: Secondary | ICD-10-CM

## 2012-03-05 DIAGNOSIS — I509 Heart failure, unspecified: Secondary | ICD-10-CM

## 2012-03-05 DIAGNOSIS — I5032 Chronic diastolic (congestive) heart failure: Secondary | ICD-10-CM

## 2012-03-05 DIAGNOSIS — R5383 Other fatigue: Secondary | ICD-10-CM

## 2012-03-05 DIAGNOSIS — M549 Dorsalgia, unspecified: Secondary | ICD-10-CM

## 2012-03-05 DIAGNOSIS — I2510 Atherosclerotic heart disease of native coronary artery without angina pectoris: Secondary | ICD-10-CM

## 2012-03-05 DIAGNOSIS — I10 Essential (primary) hypertension: Secondary | ICD-10-CM

## 2012-03-05 DIAGNOSIS — R110 Nausea: Secondary | ICD-10-CM | POA: Insufficient documentation

## 2012-03-05 DIAGNOSIS — Z953 Presence of xenogenic heart valve: Secondary | ICD-10-CM

## 2012-03-05 MED ORDER — LACTULOSE 20 GM/30ML PO SOLN: 30.0000 mL | Freq: Two times a day (BID) | ORAL | Status: DC

## 2012-03-05 MED ORDER — METOCLOPRAMIDE HCL 5 MG PO TABS: 5.0000 mg | ORAL_TABLET | Freq: Three times a day (TID) | ORAL | Status: DC

## 2012-03-05 NOTE — Assessment & Plan Note (Signed)
Etiology for her nausea is uncertain. She reports a long history, worse after eating. Possible gastroparesis. We have suggested she try Reglan 5 mg before eating to see if this helps with her symptoms.

## 2012-03-05 NOTE — Assessment & Plan Note (Signed)
She would like an alternative to milk of magnesia. We have sent a prescription for lactulose. She reports MiraLAX does not work. Metamucil does not work.

## 2012-03-05 NOTE — Assessment & Plan Note (Signed)
Appears to be relatively euvolemic. Mild shortness of breath. She is not interested in a diuretic at this time. No significant edema.

## 2012-03-05 NOTE — Progress Notes (Signed)
Patient ID: Michele Schultz, female    DOB: 1931-01-25, 78 y.o.   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 She did have postoperative anemia and difficulty mobilizing.   She reports that she continues to have fatigue. She has severe back pain, spinal stenosis. She has had epidural shots x2 with mild relief. She has chronic nausea, sometimes vomiting worse after eating. She has some constipation but has to stay on milk of magnesia every day. She would like an alternative to milk of magnesia. She is done with rehabilitation and does not exercise on a regular basis.  She has chronic back pain, chronic knee pain and hip pain. She has had a knee and hip replacement.   EKG shows right bundle branch block, normal sinus rhythm with rate 66 beats per minute   Outpatient Encounter Prescriptions as of 03/05/2012  Medication Sig Dispense Refill  . amLODipine (NORVASC) 5 MG tablet Take 1 tablet (5 mg total) by mouth daily.  30 tablet  6  . aspirin 81 MG tablet Take 81 mg by mouth daily.        Marland Kitchen atorvastatin (LIPITOR) 40 MG tablet Take 40 mg by mouth daily.        Marland Kitchen azelastine (ASTELIN) 137 MCG/SPRAY nasal spray Place 1 spray into the nose 2 (two) times daily. Use in each nostril as directed      . benzonatate (TESSALON) 100 MG capsule Take 200 mg by mouth 3 (three) times daily as needed.      . Cholecalciferol (VITAMIN D) 2000 UNITS CAPS Take 1 capsule by mouth daily.        .  Cyanocobalamin (VITAMIN B-12) 1000 MCG/15ML LIQD Inject 1,000 mcg as directed every 30 (thirty) days.        . cycloSPORINE (RESTASIS) 0.05 % ophthalmic emulsion Place 1 drop into both eyes 2 (two) times daily.       . diazepam (VALIUM) 5 MG tablet Take 5 mg by mouth every 6 (six) hours as needed.        . docusate sodium (COLACE) 100 MG capsule Take 100 mg by mouth 2 (two) times daily.        Marland Kitchen gabapentin (NEURONTIN) 100 MG capsule Take two tablets by mouth at bedtime.      Marland Kitchen HYDROcodone-acetaminophen (NORCO) 5-325 MG per tablet Take 2 tablets by mouth every 4 (four) hours as needed.      Marland Kitchen losartan (COZAAR) 25 MG tablet Take 1 tablet (25 mg total) by mouth daily.  25 tablet  90  . magnesium hydroxide (MILK OF MAGNESIA) 400 MG/5ML suspension Take by mouth daily as needed.      . metFORMIN (GLUCOPHAGE) 500 MG tablet Take 1 tablet (500 mg total) by mouth 2 (two) times daily with a meal.  60 tablet  1  . metoprolol (TOPROL-XL) 50 MG 24 hr tablet Take 50 mg by mouth daily.        . Multiple Vitamins-Minerals (CENTRUM SILVER ULTRA WOMENS PO) Take 1 tablet  by mouth daily.      Marland Kitchen oxycodone (OXY-IR) 5 MG capsule Take 10 mg by mouth daily.       . pantoprazole (PROTONIX) 40 MG tablet Take 40 mg by mouth 2 (two) times daily.          Review of Systems  HENT: Negative.   Eyes: Negative.   Gastrointestinal: Negative.   Musculoskeletal: Positive for back pain, arthralgias and gait problem.  Skin: Negative.   Neurological: Negative.   Hematological: Negative.   Psychiatric/Behavioral: Negative.   All other systems reviewed and are negative.   BP 116/72  Pulse 70  Ht 5\' 4"  (1.626 m)  Wt 222 lb (100.699 kg)  BMI 38.11 kg/m2  Physical Exam  Nursing note and vitals reviewed. Constitutional: She is oriented to person, place, and time. She appears well-developed and well-nourished.       Obese  HENT:  Head: Normocephalic.  Nose: Nose normal.  Mouth/Throat: Oropharynx is clear and moist.  Eyes:  Conjunctivae are normal. Pupils are equal, round, and reactive to light.  Neck: Normal range of motion. Neck supple. No JVD present.  Cardiovascular: Normal rate, regular rhythm, S1 normal, S2 normal and intact distal pulses.  Exam reveals no gallop and no friction rub.   Murmur heard.  Systolic murmur is present with a grade of 2/6       Well-healed midline incision  Pulmonary/Chest: Effort normal and breath sounds normal. No respiratory distress. She has no wheezes. She has no rales. She exhibits no tenderness.  Abdominal: Soft. Bowel sounds are normal. She exhibits no distension. There is no tenderness.  Musculoskeletal: Normal range of motion. She exhibits no edema and no tenderness.  Lymphadenopathy:    She has no cervical adenopathy.  Neurological: She is alert and oriented to person, place, and time. Coordination normal.  Skin: Skin is warm and dry. No rash noted. No erythema.  Psychiatric: She has a normal mood and affect. Her behavior is normal. Judgment and thought content normal.         Assessment and Plan

## 2012-03-05 NOTE — Assessment & Plan Note (Signed)
Currently with no symptoms of angina. No further workup at this time. Continue current medication regimen. 

## 2012-03-05 NOTE — Patient Instructions (Addendum)
You are doing well. Please try lactulose twice a day for constipation Try reglan one pill before meals for nausea  Please call us if you have new issues that need to be addressed before your next appt.  Your physician wants you to follow-up in: 6 months.  You will receive a reminder letter in the mail two months in advance. If you don't receive a letter, please call our office to schedule the follow-up appointment.

## 2012-03-05 NOTE — Assessment & Plan Note (Signed)
We have suggested she stay on her current dose of Lipitor

## 2012-03-05 NOTE — Assessment & Plan Note (Signed)
We have suggested if her back pain is severe as she is stating, she consider back surgery if needed.

## 2012-03-26 ENCOUNTER — Other Ambulatory Visit: Payer: Medicare Other

## 2012-04-05 ENCOUNTER — Emergency Department: Payer: Self-pay | Admitting: Emergency Medicine

## 2012-04-05 LAB — URINALYSIS, COMPLETE
Leukocyte Esterase: NEGATIVE
Nitrite: NEGATIVE
RBC,UR: 1 /HPF (ref 0–5)
Specific Gravity: 1.01 (ref 1.003–1.030)
Squamous Epithelial: 1
WBC UR: 1 /HPF (ref 0–5)

## 2012-04-05 LAB — COMPREHENSIVE METABOLIC PANEL
Albumin: 3.9 g/dL (ref 3.4–5.0)
Alkaline Phosphatase: 88 U/L (ref 50–136)
Anion Gap: 10 (ref 7–16)
BUN: 14 mg/dL (ref 7–18)
Calcium, Total: 9.6 mg/dL (ref 8.5–10.1)
Chloride: 104 mmol/L (ref 98–107)
EGFR (Non-African Amer.): >60
Glucose: 102 mg/dL — ABNORMAL HIGH (ref 65–99)
Potassium: 2.7 mmol/L — ABNORMAL LOW (ref 3.5–5.1)
SGOT(AST): 24 U/L (ref 15–37)
SGPT (ALT): 23 U/L
Total Protein: 7.7 g/dL (ref 6.4–8.2)

## 2012-04-05 LAB — CBC
HCT: 36.5 % (ref 35.0–47.0)
HGB: 11.8 g/dL — ABNORMAL LOW (ref 12.0–16.0)
MCH: 27.5 pg (ref 26.0–34.0)
MCHC: 32.4 g/dL (ref 32.0–36.0)
MCV: 85 fL (ref 80–100)
Platelet: 287 10*3/uL (ref 150–440)
WBC: 9.6 10*3/uL (ref 3.6–11.0)

## 2012-04-05 LAB — LIPASE, BLOOD: Lipase: 243 U/L (ref 73–393)

## 2012-05-06 ENCOUNTER — Ambulatory Visit: Payer: Self-pay | Admitting: Internal Medicine

## 2012-05-16 ENCOUNTER — Encounter: Payer: Self-pay | Admitting: Internal Medicine

## 2012-05-16 ENCOUNTER — Ambulatory Visit (INDEPENDENT_AMBULATORY_CARE_PROVIDER_SITE_OTHER): Payer: Medicare Other | Admitting: *Deleted

## 2012-05-16 DIAGNOSIS — I4891 Unspecified atrial fibrillation: Secondary | ICD-10-CM

## 2012-05-16 DIAGNOSIS — Z950 Presence of cardiac pacemaker: Secondary | ICD-10-CM

## 2012-05-16 LAB — REMOTE PACEMAKER DEVICE
AL IMPEDENCE PM: 330 Ohm
AL THRESHOLD: 0.75 V
ATRIAL PACING PM: 1.6
BAMS-0003: 70 {beats}/min
DEVICE MODEL PM: 7284703
RV LEAD IMPEDENCE PM: 450 Ohm
RV LEAD THRESHOLD: 1.25 V
VENTRICULAR PACING PM: 1

## 2012-05-22 ENCOUNTER — Ambulatory Visit: Payer: Medicare Other | Admitting: Cardiovascular Disease

## 2012-05-22 ENCOUNTER — Telehealth: Payer: Self-pay | Admitting: Cardiovascular Disease

## 2012-05-22 ENCOUNTER — Ambulatory Visit (INDEPENDENT_AMBULATORY_CARE_PROVIDER_SITE_OTHER): Payer: Medicare Other

## 2012-05-22 ENCOUNTER — Other Ambulatory Visit: Payer: Self-pay | Admitting: Cardiovascular Disease

## 2012-05-22 VITALS — BP 110/70 | HR 84 | Ht 64.0 in | Wt 221.5 lb

## 2012-05-22 DIAGNOSIS — I2510 Atherosclerotic heart disease of native coronary artery without angina pectoris: Secondary | ICD-10-CM

## 2012-05-22 DIAGNOSIS — R0789 Other chest pain: Secondary | ICD-10-CM

## 2012-05-22 DIAGNOSIS — R0602 Shortness of breath: Secondary | ICD-10-CM

## 2012-05-22 LAB — BASIC METABOLIC PANEL
Anion Gap: 9 (ref 7–16)
Calcium, Total: 9.2 mg/dL (ref 8.5–10.1)
Co2: 27 mmol/L (ref 21–32)
EGFR (African American): >60
Glucose: 87 mg/dL (ref 65–99)
Potassium: 3.8 mmol/L (ref 3.5–5.1)
Sodium: 141 mmol/L (ref 136–145)

## 2012-05-22 LAB — CK: CK, Total: 59 U/L (ref 21–215)

## 2012-05-22 LAB — TROPONIN I: Troponin-I: <0.02 ng/mL

## 2012-05-22 MED ORDER — FUROSEMIDE 20 MG PO TABS: 20.0000 mg | ORAL_TABLET | Freq: Two times a day (BID) | ORAL | Status: DC

## 2012-05-22 MED ORDER — POTASSIUM CHLORIDE ER 10 MEQ PO TBCR: 10.0000 meq | EXTENDED_RELEASE_TABLET | Freq: Every day | ORAL | Status: DC

## 2012-05-22 NOTE — Telephone Encounter (Signed)
Pt called stating that she is having some increased pressure in her chest since yesterday. Pt feels that she need to burp.

## 2012-05-22 NOTE — Telephone Encounter (Signed)
I paged Dr. Mariah Milling to get his opinion.  He advised to bring pt in for EKG, cardiac enzymes, BNP (if she appears to be fluid overloaded) and possible start on diuretic this am.  I called pt back and advised her to come on in this am instead of this pm.  Understanding verb.

## 2012-05-22 NOTE — Telephone Encounter (Signed)
Pt reports having a "fullness" in her chest x 2 days.  She says she usually has gas pain but this will not go away. She has tried MOM, antacids, etc and cannot get any relief.  She says she has noticed some sob when lying flat, otherwise no sob.  She had AVR and 2V CABG in  Nov. 2012.  Prior to surg her only symptom was sob. Today her main symptom is fullness in the chest.  She reports she has had a BM this am. No diarrhea or GI upset.  Dr. Mariah Milling has a cancellation this pm at 2:30. I advised her to come at that time but if symptoms continued/got worse between now and then she should give [place] a call.  Understanding verb.

## 2012-05-22 NOTE — Patient Instructions (Addendum)
Your physician recommends that you return for lab work TODAY at [facility].  Your physician has recommended you make the following change in your medication:  1. Start Lasix 20 mg twice daily 2. Potassium Chloride 10 meq daily  We will call you with lab results and further instruction.

## 2012-05-22 NOTE — Progress Notes (Signed)
Pt worked in for c/o chest fullness since yesterday.  Has tried MOM and antacids without relief.  Also c/o discomfort radiating to her back.  Sob worse when lying down.  No edema on exam Rales heard in upper lung lobes EKG performed  Discussed findings with Dr. Mariah Milling who says no EKG changes, get BNP, cardiac enzymes, try lasix 20 mg BID with KCL 10 meq PO QD x 1 dose.  We will get labs and call her with results.  We will also assess to see how she feels after lasix.

## 2012-05-23 ENCOUNTER — Ambulatory Visit: Payer: Self-pay | Admitting: Internal Medicine

## 2012-05-23 ENCOUNTER — Telehealth: Payer: Self-pay

## 2012-05-23 NOTE — Telephone Encounter (Signed)
I called pt to see how she was feeling. I reassured her prelim labs drawn yesterday looked normal. Cardiac enzymes were normal, etc.  She says she still has chest fullness, "like I want to burp" that radiates around to her side. She was able to have a BM this am.  She says pain has improved only slightly.  I reassured her this is probably noncardiac since enzymes neg and EKG showed no changes.  She thinks this is GI related and has appt with GI MD tomm at 0830.  She will keep GI appt tomm and will call us with any changes.  In the meantime she is still taking the lasix as prescribed with KCL.  I will forward to Dr. Mariah Milling to see if he wants to make any other changes.

## 2012-05-23 NOTE — Telephone Encounter (Signed)
Wonder if it could be hiatal hernia. She could try Pepcid, possibly proton pump inhibitor like omeprazole. See what GI doctor says. If gas in stomach, could try simethicone/Gas-X If Lasix is not helping after several days, would hold the Lasix. This was started for possible congestive heart failure. Would set up with appointment

## 2012-05-27 NOTE — Telephone Encounter (Signed)
Pt went to GI MD as scheduled.  Says GI MD ordered multiple GI tests (i.e. U/s, upper endoscopy among other tests).  She will continue to f/u with GI MD. She will stop lasix

## 2012-05-28 ENCOUNTER — Encounter: Payer: Self-pay | Admitting: *Deleted

## 2012-07-04 ENCOUNTER — Ambulatory Visit: Payer: Self-pay | Admitting: Gastroenterology

## 2012-07-08 LAB — PATHOLOGY REPORT

## 2012-07-17 ENCOUNTER — Telehealth: Payer: Self-pay | Admitting: Cardiovascular Disease

## 2012-07-17 ENCOUNTER — Ambulatory Visit (INDEPENDENT_AMBULATORY_CARE_PROVIDER_SITE_OTHER): Payer: Medicare Other

## 2012-07-17 VITALS — BP 115/75 | HR 80 | Ht 64.0 in | Wt 220.0 lb

## 2012-07-17 DIAGNOSIS — R079 Chest pain, unspecified: Secondary | ICD-10-CM

## 2012-07-17 NOTE — Telephone Encounter (Signed)
Pt called stating that she been having some chest discomfort in her chest since last night.

## 2012-07-17 NOTE — Patient Instructions (Addendum)
No changes were made today with your medications. Your EKG looked good  Gastroesophageal Reflux Disease, Adult Gastroesophageal reflux disease (GERD) happens when acid from your stomach flows up into the esophagus. When acid comes in contact with the esophagus, the acid causes soreness (inflammation) in the esophagus. Over time, GERD may create small holes (ulcers) in the lining of the esophagus. CAUSES   Increased body weight. This puts pressure on the stomach, making acid rise from the stomach into the esophagus.   Smoking. This increases acid production in the stomach.   Drinking alcohol. This causes decreased pressure in the lower esophageal sphincter (valve or ring of muscle between the esophagus and stomach), allowing acid from the stomach into the esophagus.   Late evening meals and a full stomach. This increases pressure and acid production in the stomach.   A malformed lower esophageal sphincter.  Sometimes, no cause is found. SYMPTOMS   Burning pain in the lower part of the mid-chest behind the breastbone and in the mid-stomach area. This may occur twice a week or more often.   Trouble swallowing.   Sore throat.   Dry cough.   Asthma-like symptoms including chest tightness, shortness of breath, or wheezing.  DIAGNOSIS  Your caregiver may be able to diagnose GERD based on your symptoms. In some cases, X-rays and other tests may be done to check for complications or to check the condition of your stomach and esophagus. TREATMENT  Your caregiver may recommend over-the-counter or prescription medicines to help decrease acid production. Ask your caregiver before starting or adding any new medicines.  HOME CARE INSTRUCTIONS   Change the factors that you can control. Ask your caregiver for guidance concerning weight loss, quitting smoking, and alcohol consumption.   Avoid foods and drinks that make your symptoms worse, such as:   Caffeine or alcoholic drinks.   Chocolate.     Peppermint or mint flavorings.   Garlic and onions.   Spicy foods.   Citrus fruits, such as oranges, lemons, or limes.   Tomato-based foods such as sauce, chili, salsa, and pizza.   Fried and fatty foods.   Avoid lying down for the 3 hours prior to your bedtime or prior to taking a nap.   Eat small, frequent meals instead of large meals.   Wear loose-fitting clothing. Do not wear anything tight around your waist that causes pressure on your stomach.   Raise the head of your bed 6 to 8 inches with wood blocks to help you sleep. Extra pillows will not help.   Only take over-the-counter or prescription medicines for pain, discomfort, or fever as directed by your caregiver.   Do not take aspirin, ibuprofen, or other nonsteroidal anti-inflammatory drugs (NSAIDs).  SEEK IMMEDIATE MEDICAL CARE IF:   You have pain in your arms, neck, jaw, teeth, or back.   Your pain increases or changes in intensity or duration.   You develop nausea, vomiting, or sweating (diaphoresis).   You develop shortness of breath, or you faint.   Your vomit is green, yellow, black, or looks like coffee grounds or blood.   Your stool is red, bloody, or black.  These symptoms could be signs of other problems, such as heart disease, gastric bleeding, or esophageal bleeding. MAKE SURE YOU:   Understand these instructions.   Will watch your condition.   Will get help right away if you are not doing well or get worse.  Document Released: 08/30/2005 Document Revised: 11/09/2011 Document Reviewed: 06/09/2011 ExitCare Patient  Information 2012 [place], [place].

## 2012-07-17 NOTE — Telephone Encounter (Signed)
Pt c/o chest pain, ? Gas pain per pt. Pain is up under ribs and extends to left arm. Has been off/on since yesterday evening. Denies shortness of breath.   No relief with prilosec or simethicone. Wants to come in for an ekg " I do not want to go to the ED".  Have reassured pt and she will come in today for EKG. Mylo Red RN

## 2012-08-19 ENCOUNTER — Encounter: Payer: Medicare Other | Admitting: *Deleted

## 2012-08-19 ENCOUNTER — Telehealth: Payer: Self-pay | Admitting: Internal Medicine

## 2012-08-19 NOTE — Telephone Encounter (Signed)
Pt calling re letter she received to call here to do home transmission, I told her she 's to call the 1-800 number, but she refused saying her letter told her to call us, she wants a call back

## 2012-08-19 NOTE — Telephone Encounter (Signed)
Instructions given for transmission.  Patient to down later today.

## 2012-08-23 ENCOUNTER — Ambulatory Visit (INDEPENDENT_AMBULATORY_CARE_PROVIDER_SITE_OTHER): Payer: Medicare Other | Admitting: *Deleted

## 2012-08-23 ENCOUNTER — Encounter: Payer: Self-pay | Admitting: Internal Medicine

## 2012-08-23 DIAGNOSIS — I442 Atrioventricular block, complete: Secondary | ICD-10-CM

## 2012-08-23 LAB — PACEMAKER DEVICE OBSERVATION
AL AMPLITUDE: 1.1 mv
AL IMPEDENCE PM: 387.5 Ohm
BAMS-0001: 180 {beats}/min
BAMS-0003: 70 {beats}/min
BATTERY VOLTAGE: 2.9629 V
VENTRICULAR PACING PM: 1

## 2012-08-23 NOTE — Progress Notes (Signed)
PPM check 

## 2012-09-03 ENCOUNTER — Encounter: Payer: Self-pay | Admitting: Cardiovascular Disease

## 2012-09-03 ENCOUNTER — Ambulatory Visit (INDEPENDENT_AMBULATORY_CARE_PROVIDER_SITE_OTHER): Payer: Medicare Other | Admitting: Cardiovascular Disease

## 2012-09-03 VITALS — BP 122/60 | HR 81 | Ht 64.0 in | Wt 216.2 lb

## 2012-09-03 DIAGNOSIS — I350 Nonrheumatic aortic (valve) stenosis: Secondary | ICD-10-CM

## 2012-09-03 DIAGNOSIS — Z950 Presence of cardiac pacemaker: Secondary | ICD-10-CM

## 2012-09-03 DIAGNOSIS — Z951 Presence of aortocoronary bypass graft: Secondary | ICD-10-CM

## 2012-09-03 DIAGNOSIS — I2510 Atherosclerotic heart disease of native coronary artery without angina pectoris: Secondary | ICD-10-CM

## 2012-09-03 DIAGNOSIS — I4891 Unspecified atrial fibrillation: Secondary | ICD-10-CM

## 2012-09-03 DIAGNOSIS — I5032 Chronic diastolic (congestive) heart failure: Secondary | ICD-10-CM

## 2012-09-03 DIAGNOSIS — I10 Essential (primary) hypertension: Secondary | ICD-10-CM

## 2012-09-03 DIAGNOSIS — I359 Nonrheumatic aortic valve disorder, unspecified: Secondary | ICD-10-CM

## 2012-09-03 NOTE — Assessment & Plan Note (Signed)
Chronic mild shortness of breath. Difficulty to determine her fluid status given her body habitus though appears relatively euvolemic. We have suggested she use Lasix sparingly, when necessary for worsening edema, or abdominal swelling or shortness of breath.

## 2012-09-03 NOTE — Assessment & Plan Note (Signed)
Status post AVR. Repeat echocardiogram next year.

## 2012-09-03 NOTE — Patient Instructions (Addendum)
You are doing well. No medication changes were made.  Please take lasix as needed for edema, shortness of breath  Please call us if you have new issues that need to be addressed before your next appt.  Your physician wants you to follow-up in: 6 months.  You will receive a reminder letter in the mail two months in advance. If you don't receive a letter, please call our office to schedule the follow-up appointment.

## 2012-09-03 NOTE — Assessment & Plan Note (Signed)
Blood pressure is well controlled on today's visit. No changes made to the medications. 

## 2012-09-03 NOTE — Assessment & Plan Note (Signed)
Followed by [facility]. 

## 2012-09-03 NOTE — Assessment & Plan Note (Signed)
Currently with no symptoms of angina. No further workup at this time. Continue current medication regimen. 

## 2012-09-03 NOTE — Progress Notes (Signed)
Patient ID: Michele Schultz, female    DOB: 05/19/1931, 78 y.o.   MRN: [id number]  HPI Comments: Ms. 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 She did have postoperative anemia and difficulty mobilizing.   Following her surgery, she had  severe back pain, spinal stenosis. She  had epidural shots x2 with mild relief, chronic nausea, sometimes vomiting worse after eating. She is done with rehabilitation and does not exercise on a regular basis. She is interested in participating in rehabilitation again.  She has chronic back pain, chronic knee pain and hip pain. She has had bilateral  knee and hip replacement.   She reports that her potassium was recently increased from 10-20 mEq. Weight is down 6 pounds from April of this year She continues to have chronic mild shortness of breath. She's not taking a diuretic  EKG shows right bundle branch block, normal sinus rhythm with rate 79 beats per minute   Outpatient Encounter Prescriptions as of 09/03/2012  Medication Sig Dispense Refill  . amLODipine (NORVASC) 5 MG tablet Take 1 tablet (5 mg total) by mouth daily.  30 tablet  6  . aspirin 81 MG tablet Take 81 mg by mouth daily.        Marland Kitchen atorvastatin (LIPITOR) 40 MG tablet Take 40 mg by mouth daily.        Marland Kitchen azelastine (ASTELIN) 137 MCG/SPRAY nasal spray Place 1 spray into the nose 2 (two) times daily. Use in each nostril as directed      . benzonatate (TESSALON) 100 MG capsule Take 200 mg by mouth 3 (three) times daily as needed.      .  Cholecalciferol (VITAMIN D) 2000 UNITS CAPS Take 1 capsule by mouth daily.        . Cyanocobalamin (VITAMIN B-12) 1000 MCG/15ML LIQD Inject 1,000 mcg as directed every 30 (thirty) days.        . cycloSPORINE (RESTASIS) 0.05 % ophthalmic emulsion Place 1 drop into both eyes 2 (two) times daily.       . diazepam (VALIUM) 5 MG tablet Take 5 mg by mouth every 6 (six) hours as needed.        . docusate sodium (COLACE) 100 MG capsule Take 100 mg by mouth 2 (two) times daily.        . furosemide (LASIX) 20 MG tablet Take 1 tablet (20 mg total) by mouth 2 (two) times daily.  90 tablet  3  . furosemide (LASIX) 40 MG tablet Take 1 tablet (40 mg total) by mouth daily.  7 tablet  0  . gabapentin (NEURONTIN) 100 MG capsule Take two tablets by mouth at bedtime.      Marland Kitchen HYDROcodone-acetaminophen (NORCO) 5-325 MG per tablet Take 2 tablets by mouth every 4 (four) hours as needed.      . Lactulose 20 GM/30ML SOLN Take 30 mLs (20 g total) by mouth 2 (two) times daily.  500 mL  3  . losartan (COZAAR) 25 MG tablet Take 1 tablet (25 mg total) by mouth daily.  25 tablet  90  .  magnesium hydroxide (MILK OF MAGNESIA) 400 MG/5ML suspension Take by mouth daily as needed.      . metFORMIN (GLUCOPHAGE) 500 MG tablet Take 1 tablet (500 mg total) by mouth 2 (two) times daily with a meal.  60 tablet  1  . metoCLOPramide (REGLAN) 5 MG tablet Take 1 tablet (5 mg total) by mouth 3 (three) times daily before meals.  90 tablet  6  . metoprolol (TOPROL-XL) 50 MG 24 hr tablet Take 50 mg by mouth daily.        . Multiple Vitamins-Minerals (CENTRUM SILVER ULTRA WOMENS PO) Take 1 tablet by mouth daily.      . pantoprazole (PROTONIX) 40 MG tablet Take 40 mg by mouth 2 (two) times daily.        . potassium chloride (K-DUR) 10 MEQ tablet Take 1 tablet (10 mEq total) by mouth daily.  90 tablet  3    Review of Systems  Constitutional: Negative.   HENT: Negative.   Eyes: Negative.   Respiratory: Negative.   Cardiovascular: Negative.     Gastrointestinal: Negative.   Musculoskeletal: Positive for back pain, arthralgias and gait problem.  Skin: Negative.   Neurological: Negative.   Hematological: Negative.   Psychiatric/Behavioral: Negative.   All other systems reviewed and are negative.   BP 122/60  Pulse 81  Ht 5\' 4"  (1.626 m)  Wt 216 lb 4 oz (98.09 kg)  BMI 37.12 kg/m2  Physical Exam  Nursing note and vitals reviewed. Constitutional: She is oriented to person, place, and time. She appears well-developed and well-nourished.       Obese  HENT:  Head: Normocephalic.  Nose: Nose normal.  Mouth/Throat: Oropharynx is clear and moist.  Eyes: Conjunctivae normal are normal. Pupils are equal, round, and reactive to light.  Neck: Normal range of motion. Neck supple. No JVD present.  Cardiovascular: Normal rate, regular rhythm, S1 normal, S2 normal and intact distal pulses.  Exam reveals no gallop and no friction rub.   Murmur heard.  Systolic murmur is present with a grade of 2/6       Well-healed midline incision  Pulmonary/Chest: Effort normal and breath sounds normal. No respiratory distress. She has no wheezes. She has no rales. She exhibits no tenderness.  Abdominal: Soft. Bowel sounds are normal. She exhibits no distension. There is no tenderness.  Musculoskeletal: Normal range of motion. She exhibits no edema and no tenderness.  Lymphadenopathy:    She has no cervical adenopathy.  Neurological: She is alert and oriented to person, place, and time. Coordination normal.  Skin: Skin is warm and dry. No rash noted. No erythema.  Psychiatric: She has a normal mood and affect. Her behavior is normal. Judgment and thought content normal.         Assessment and Plan

## 2012-10-03 ENCOUNTER — Telehealth: Payer: Self-pay | Admitting: Internal Medicine

## 2012-10-03 NOTE — Telephone Encounter (Signed)
Patients phone is now operational.  I will set up her remote monitor tomorrow.

## 2012-10-03 NOTE — Telephone Encounter (Signed)
Pt calling states that Michele Schultz ask pt to call her today. Pt would like to speak to her

## 2012-10-10 ENCOUNTER — Encounter: Payer: Self-pay | Admitting: Internal Medicine

## 2012-10-10 ENCOUNTER — Ambulatory Visit (INDEPENDENT_AMBULATORY_CARE_PROVIDER_SITE_OTHER): Payer: Medicare Other | Admitting: Internal Medicine

## 2012-10-10 VITALS — BP 122/78 | HR 80 | Ht 64.0 in | Wt 221.5 lb

## 2012-10-10 DIAGNOSIS — I442 Atrioventricular block, complete: Secondary | ICD-10-CM

## 2012-10-10 DIAGNOSIS — I4891 Unspecified atrial fibrillation: Secondary | ICD-10-CM

## 2012-10-10 DIAGNOSIS — Z950 Presence of cardiac pacemaker: Secondary | ICD-10-CM

## 2012-10-10 LAB — PACEMAKER DEVICE OBSERVATION
AL AMPLITUDE: 1.4 mv
BAMS-0001: 180 {beats}/min
DEVICE MODEL PM: 7284703
RV LEAD AMPLITUDE: 9.4 mv
RV LEAD THRESHOLD: 1 V

## 2012-10-10 NOTE — Assessment & Plan Note (Signed)
No intercurrent atrial fibrillation detected on her device 

## 2012-10-10 NOTE — Assessment & Plan Note (Signed)
The patient's device was interrogated.  The information was reviewed. No changes were made in the programming.    

## 2012-10-10 NOTE — Patient Instructions (Addendum)
Your physician wants you to follow-up in: 1 year with Dr. Graciela Husbands. [employer] transmission 01/13/1913. You will receive a reminder letter in the mail two months in advance. If you don't receive a letter, please call our office to schedule the follow-up appointment.  Marland Kitchen

## 2012-10-10 NOTE — Progress Notes (Signed)
[email or web address] Patient Care Team: Daniel Nones as PCP - General Antonieta Iba, MD (Cardiology)   HPI  Michele Schultz is a 78 y.o. female Seen in followup for pacemaker implanted for complete heart block following aortic valve replacement.  The patient has a history of coronary artery grafting and aortic valve replacement in November 2012 with a bioprostheticvalve she was found at her last visit to have paroxysms of atrial fibrillation lasting a relatively brief period.  Dr. Daniel Nones has adjusted her medication regime by limiting many.   Past Medical History  Diagnosis Date  . Hypertension   . Anemia   . Neuralgia, neuritis, and radiculitis, unspecified   . Coronary artery disease     s/p CABG 11/12  . Aortic valve disorders     s/p AVR for severe AS 11/12  . Onychia and paronychia of toe   . Osteoarthrosis, unspecified whether generalized or localized, pelvic region and thigh     mainly in her back and knees  . Enthesopathy of hip region   . Esophageal reflux     followed by Dr.Seigal. stabilized with a combination of Nexium and Zantac  . Knee joint replacement by other means   . Stress incontinence, female     followed by Dr.Cope  . Atrioventricular block, complete   . Cardiac pacemaker -st Judes     11/12  . Chronic diastolic heart failure     ef 40%HKVQ 11/12  . Type II or unspecified type diabetes mellitus without mention of complication, not stated as uncontrolled     pt. reports that she is borderline   . Spinal stenosis     Past Surgical History  Procedure Date  . Replacement total knee 11/2006    right knee  . Nasal sinus surgery 2009  . Total hip arthroplasty 09/2010  . Cardiac catheterization 08/2009    50% stenosis distal left main, 50% stenosis ostial left circumflex.   . Cardiac catheterization     at [facility]  . Eye surgery     IOL/ after cataracts removed- bilateral   . Aortic valve replacement 10/17/2011    Procedure: AORTIC VALVE REPLACEMENT (AVR);   Surgeon: [name], MD;  Location[facility];  Service: Open Heart Surgery;  Laterality: N/A;  . Coronary artery bypass graft 10/17/2011    Procedure: CORONARY ARTERY BYPASS GRAFTING (CABG);  Surgeon: [name], MD;  Location: [facility] OR;  Service: Open Heart Surgery;  Laterality: N/A;  Times Two, using right leg greater saphenous vein harvested endoscopically    Current Outpatient Prescriptions  Medication Sig Dispense Refill  . amLODipine (NORVASC) 5 MG tablet Take 1 tablet (5 mg total) by mouth daily.  30 tablet  6  . aspirin 81 MG tablet Take 81 mg by mouth daily.        Marland Kitchen atorvastatin (LIPITOR) 40 MG tablet Take 40 mg by mouth daily.        Marland Kitchen azelastine (ASTELIN) 137 MCG/SPRAY nasal spray Place 1 spray into the nose 2 (two) times daily. Use in each nostril as directed      . benzonatate (TESSALON) 100 MG capsule Take 200 mg by mouth 3 (three) times daily as needed.      . Cyanocobalamin (VITAMIN B-12) 1000 MCG/15ML LIQD Inject 1,000 mcg as directed every 30 (thirty) days.        . cycloSPORINE (RESTASIS) 0.05 % ophthalmic emulsion Place 1 drop into both eyes 2 (two) times daily.       Marland Kitchen  diazepam (VALIUM) 5 MG tablet Take 5 mg by mouth every 6 (six) hours as needed.        . gabapentin (NEURONTIN) 100 MG capsule Take two tablets by mouth at bedtime.      Marland Kitchen HYDROcodone-acetaminophen (NORCO) 5-325 MG per tablet Take 2 tablets by mouth every 4 (four) hours as needed.      Marland Kitchen losartan (COZAAR) 25 MG tablet Take 1 tablet (25 mg total) by mouth daily.  25 tablet  90  . magnesium hydroxide (MILK OF MAGNESIA) 400 MG/5ML suspension Take by mouth daily as needed.      . metoprolol (TOPROL-XL) 50 MG 24 hr tablet Take 50 mg by mouth daily.        . Multiple Vitamins-Minerals (CENTRUM SILVER ULTRA WOMENS PO) Take 1 tablet by mouth daily.      . pantoprazole (PROTONIX) 40 MG tablet Take 40 mg by mouth 2 (two) times daily.        . potassium chloride (K-DUR) 10 MEQ tablet Take 1 tablet (10  mEq total) by mouth daily.  90 tablet  3    Allergies  Allergen Reactions  . Citalopram     Altered mental status  . Cymbalta (Duloxetine Hcl)     Caused sedation  . Imipramine   . Proton Pump Inhibitors     Review of Systems negative except from HPI and PMH  Physical Exam BP 122/78  Pulse 80  Ht 5\' 4"  (1.626 m)  Wt 221 lb 8 oz (100.472 kg)  BMI 38.02 kg/m2 Well developed and well nourished in no acute distress HENT normal E scleral and icterus clear Neck Supple JVP flat; carotids brisk and full Clear to ausculation  Regular rate and rhythm, 2/6 murmur with S4 Soft with active bowel sounds No clubbing cyanosis no Edema Alert and oriented, grossly normal motor and sensory function Skin Warm and Dry    Assessment and  Plan And

## 2012-11-16 ENCOUNTER — Other Ambulatory Visit: Payer: Self-pay | Admitting: Internal Medicine

## 2012-11-18 ENCOUNTER — Other Ambulatory Visit: Payer: Self-pay | Admitting: *Deleted

## 2012-11-18 NOTE — Telephone Encounter (Deleted)
Pt hasn't been seen since 01/2012, and does not have a f/u  scheduled. Are you ok with refilling this?

## 2012-11-18 NOTE — Telephone Encounter (Signed)
Opened in error, rx sent to provider electronically

## 2013-01-13 ENCOUNTER — Encounter: Payer: Medicare Other | Admitting: *Deleted

## 2013-01-24 ENCOUNTER — Telehealth: Payer: Self-pay | Admitting: *Deleted

## 2013-01-24 ENCOUNTER — Other Ambulatory Visit: Payer: Self-pay

## 2013-01-24 MED ORDER — NITROGLYCERIN 0.4 MG SL SUBL: 0.4000 mg | SUBLINGUAL_TABLET | SUBLINGUAL | Status: DC | PRN

## 2013-01-24 NOTE — Telephone Encounter (Signed)
Pt called in to say she has been having chest pain since yesterday. Says she attributed this to gas but has been taking antacids all day without relief. She asks if she can see Dr. Mariah Milling today. I explained, based on her hx and symptoms and if she is having active chest discomfort, she should go to ER. She refuses to go to ER and states, "Dr. Mariah Milling told me to call him when this happens" I immediately placed her on hold and discussed with Dr. Mariah Milling. He gave orders as follows "Have patient take NTG PRN chest discomfort. Offer stress test or cath. Both of these can be done next week.  If pain persists despite NTG, she should go to ER" VO Dr. Alvis Lemmings, RN I informed pt of Dr. Windell Hummingbird suggestion.  She says she has never been prescribed NTG RX sent to pharm at Dr. Windell Hummingbird suggestion She was instructed as to how to take SL NTG for CP and has agreed to go to ER should CP persist despite meds Otherwise we will see her Monday 2/24 at 1145 to discuss and evaluate.

## 2013-01-25 ENCOUNTER — Telehealth: Payer: Self-pay | Admitting: Physician Assistant

## 2013-01-25 NOTE — Telephone Encounter (Signed)
Pt called because her PPM monitor was beeping. This was very upsetting to her. She was supposed to transmit last pm, not sure if the transmission went because her phone cuts out at times. The device started beeping all of a sudden. All lights are flashing on the box and it is loud (easily heard over the phone). The noise and lights are making her very anxious and she is very worried her device is not functioning correctly. She is not having dizziness, palpitations, chest pain or SOB. She is not weak.  Advised her the box is not working right, the device is working fine. Spoke with JA, he advised to disconnect the box from the wall for 10 minutes and re-connect it to see if that helps. Pt tried this but it did not help. A friend came in that his the reset button. This worked briefly but then the box started beeping again.  JA contacted pt and advised her to disconnect the box and the office would call her on Monday. Contact us in the meantime if she has any symptoms. Pt was in agreement with this.

## 2013-01-27 ENCOUNTER — Ambulatory Visit (INDEPENDENT_AMBULATORY_CARE_PROVIDER_SITE_OTHER): Payer: Medicare Other | Admitting: Cardiovascular Disease

## 2013-01-27 ENCOUNTER — Encounter: Payer: Self-pay | Admitting: Cardiovascular Disease

## 2013-01-27 ENCOUNTER — Ambulatory Visit (INDEPENDENT_AMBULATORY_CARE_PROVIDER_SITE_OTHER): Payer: Medicare Other | Admitting: *Deleted

## 2013-01-27 ENCOUNTER — Encounter: Payer: Self-pay | Admitting: Internal Medicine

## 2013-01-27 VITALS — BP 122/62 | HR 75 | Ht 64.0 in | Wt 232.0 lb

## 2013-01-27 DIAGNOSIS — Z954 Presence of other heart-valve replacement: Secondary | ICD-10-CM

## 2013-01-27 DIAGNOSIS — I442 Atrioventricular block, complete: Secondary | ICD-10-CM

## 2013-01-27 DIAGNOSIS — R0609 Other forms of dyspnea: Secondary | ICD-10-CM

## 2013-01-27 DIAGNOSIS — R079 Chest pain, unspecified: Secondary | ICD-10-CM

## 2013-01-27 DIAGNOSIS — Z951 Presence of aortocoronary bypass graft: Secondary | ICD-10-CM

## 2013-01-27 DIAGNOSIS — R0602 Shortness of breath: Secondary | ICD-10-CM

## 2013-01-27 DIAGNOSIS — E785 Hyperlipidemia, unspecified: Secondary | ICD-10-CM

## 2013-01-27 DIAGNOSIS — Z950 Presence of cardiac pacemaker: Secondary | ICD-10-CM

## 2013-01-27 DIAGNOSIS — I5032 Chronic diastolic (congestive) heart failure: Secondary | ICD-10-CM

## 2013-01-27 DIAGNOSIS — R0600 Dyspnea, unspecified: Secondary | ICD-10-CM

## 2013-01-27 DIAGNOSIS — Z953 Presence of xenogenic heart valve: Secondary | ICD-10-CM

## 2013-01-27 DIAGNOSIS — R0989 Other specified symptoms and signs involving the circulatory and respiratory systems: Secondary | ICD-10-CM

## 2013-01-27 DIAGNOSIS — I10 Essential (primary) hypertension: Secondary | ICD-10-CM

## 2013-01-27 DIAGNOSIS — Z952 Presence of prosthetic heart valve: Secondary | ICD-10-CM

## 2013-01-27 NOTE — Assessment & Plan Note (Signed)
Echocardiogram pending to evaluate aortic valve.

## 2013-01-27 NOTE — Assessment & Plan Note (Addendum)
Worsened recently. Suspect this is multifactorial secondary to deconditioning with advanced age and sedentary lifestyle due to arthritis pain and diastolic dysfunction. The patient is also s/p AVR, and a repeat echocardiogram is due to assess valve patency.  Patient is due to follow-up with her PCP in 1-2 weeks and scheduled for lab work.   If right ventricular systolic pressure is elevated, could restart Lasix when necessary or on a regular scheduled periodic dosing regimen.

## 2013-01-27 NOTE — Progress Notes (Signed)
Patient ID: Michele Schultz, female    DOB: Aug 15, 1931, 79 y.o.   MRN: [id number]  HPI Comments: Ms. 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 She did have postoperative anemia and difficulty mobilizing.   Following her surgery, she had  severe back pain, spinal stenosis. She  had epidural shots x2 with mild relief, chronic nausea, sometimes vomiting worse after eating. She does not exercise on a regular basis. She recently finished rehabilitation for shoulder injury. Husband is in declining health and living at [facility] health care. She tries to take care of him 2 days per week.   She has chronic back pain, chronic knee pain and hip pain. She has had bilateral  knee and hip replacement.   Last Friday, 3 days ago, she developed chest pain. He was late in the afternoon and we are unable to schedule her for a visit in the office. For her chest pain, we suggested she take nitroglycerin, and TUMS. She reports that she took TUMS and symptoms resolved. She reports having these symptoms in the past. She is scared of taking nitroglycerin. She has not been taking Lasix on a regular basis. Shortness of breath is mild, stable but chronic.  She continues to endorse chronic pain secondary to arthritis, and is fairly sedentary as a result. She reports experiencing vague left-side/LUQ discomfort with associated shortness of breath a few days.  She also had some difficulty with her PPM monitor beeping over the weekend. This had to be disconnected altogether, and is  currently non-functioning.   EKG shows right bundle branch block, normal sinus rhythm with rate 75 beats per minute    Outpatient Encounter Prescriptions as of 01/27/2013  Medication Sig Dispense Refill  . amLODipine (NORVASC) 5 MG tablet Take 1 tablet (5 mg total) by mouth daily.  30 tablet  6  . aspirin 81 MG tablet Take 81 mg by mouth daily.        Marland Kitchen atorvastatin (LIPITOR) 40 MG tablet Take 40 mg by mouth daily.        Marland Kitchen azelastine (ASTELIN) 137 MCG/SPRAY nasal spray Place 1 spray into the nose 2 (two) times daily. Use in each nostril as directed      . benzonatate (TESSALON) 100 MG capsule Take 200 mg by mouth 3 (three) times daily as needed.      . Cyanocobalamin (VITAMIN B-12) 1000 MCG/15ML LIQD Inject 1,000 mcg as directed every 30 (thirty) days.        . cycloSPORINE (RESTASIS) 0.05 % ophthalmic emulsion Place 1 drop into both eyes 2 (two) times daily.       Marland Kitchen gabapentin (NEURONTIN) 100 MG capsule Take two tablets by mouth at bedtime.      Marland Kitchen HYDROcodone-acetaminophen (NORCO) 5-325 MG per tablet Take 2 tablets by mouth every 4 (four) hours as needed.      Marland Kitchen losartan (COZAAR) 25 MG tablet Take 1 tablet (25 mg total) by mouth daily.  25 tablet  90  . magnesium hydroxide (MILK OF MAGNESIA) 400 MG/5ML suspension Take by mouth daily as needed.      Marland Kitchen  metoprolol (TOPROL-XL) 50 MG 24 hr tablet Take 50 mg by mouth daily.        . nitroGLYCERIN (NITROSTAT) 0.4 MG SL tablet Place 1 tablet (0.4 mg total) under the tongue every 5 (five) minutes as needed for chest pain.  90 tablet  3  . pantoprazole (PROTONIX) 40 MG tablet Take 40 mg by mouth 2 (two) times daily.        . potassium chloride (K-DUR) 10 MEQ tablet Take 1 tablet (10 mEq total) by mouth daily.  90 tablet  3  . [DISCONTINUED] diazepam (VALIUM) 5 MG tablet Take 5 mg by mouth every 6 (six) hours as needed.        . [DISCONTINUED] Multiple Vitamins-Minerals (CENTRUM SILVER ULTRA WOMENS PO) Take 1 tablet by mouth daily.       No  facility-administered encounter medications on file as of 01/27/2013.    Review of Systems  Constitutional: Negative.   HENT: Negative.   Eyes: Negative.   Respiratory: Negative.   Cardiovascular: Positive for chest pain.  Gastrointestinal: Negative.   Musculoskeletal: Positive for back pain, arthralgias and gait problem.  Skin: Negative.   Neurological: Negative.   Psychiatric/Behavioral: Negative.   All other systems reviewed and are negative.   BP 122/62  Pulse 75  Ht 5\' 4"  (1.626 m)  Wt 232 lb (105.235 kg)  BMI 39.8 kg/m2  Physical Exam  Nursing note and vitals reviewed. Constitutional: She is oriented to person, place, and time. She appears well-developed and well-nourished.  Obese  HENT:  Head: Normocephalic.  Nose: Nose normal.  Mouth/Throat: Oropharynx is clear and moist.  Eyes: Conjunctivae are normal. Pupils are equal, round, and reactive to light.  Neck: Normal range of motion. Neck supple. No JVD present.  Cardiovascular: Normal rate, regular rhythm, S1 normal, S2 normal and intact distal pulses.  Exam reveals no gallop and no friction rub.   Murmur heard.  Systolic murmur is present with a grade of 2/6  Well-healed midline incision  Pulmonary/Chest: Effort normal and breath sounds normal. No respiratory distress. She has no wheezes. She has no rales. She exhibits no tenderness.  Abdominal: Soft. Bowel sounds are normal. She exhibits no distension. There is no tenderness.  Musculoskeletal: Normal range of motion. She exhibits no edema and no tenderness.  Lymphadenopathy:    She has no cervical adenopathy.  Neurological: She is alert and oriented to person, place, and time. Coordination normal.  Skin: Skin is warm and dry. No rash noted. No erythema.  Psychiatric: She has a normal mood and affect. Her behavior is normal. Judgment and thought content normal.    Assessment and Plan

## 2013-01-27 NOTE — Assessment & Plan Note (Signed)
Recent chest pain is concerning. It did relieve with TUMS. If symptoms persist, recur on a regular basis. May need to consider further testing.

## 2013-01-27 NOTE — Assessment & Plan Note (Signed)
She is scheduled for lab work through her primary care physician, Dr. Graciela Husbands

## 2013-01-27 NOTE — Assessment & Plan Note (Signed)
Blood pressure is well controlled on today's visit. No changes made to the medications. 

## 2013-01-27 NOTE — Assessment & Plan Note (Signed)
She does have chronic mild shortness of breath. No signs of fluid overload on today's visit. Echocardiogram has been ordered to evaluate right ventricular systolic pressure and her aortic valve.

## 2013-01-27 NOTE — Patient Instructions (Addendum)
You are doing well. No medication changes were made.  Please take tums and zantac for chest pain, as well as soda. Ok to take nitro for chest pain   We will schedule an echo to look at the aortic valve replacement, shortness of breath, chest pain  Please call us if you have new issues that need to be addressed before your next appt.  Your physician wants you to follow-up in: 6 months.  You will receive a reminder letter in the mail two months in advance. If you don't receive a letter, please call our office to schedule the follow-up appointment.

## 2013-02-03 ENCOUNTER — Telehealth: Payer: Self-pay

## 2013-02-03 NOTE — Telephone Encounter (Signed)
Patient given instructions on sending transmission and will send later today.

## 2013-02-03 NOTE — Telephone Encounter (Signed)
Pt states she needs her" machine restarted". Please call.

## 2013-02-04 ENCOUNTER — Other Ambulatory Visit (INDEPENDENT_AMBULATORY_CARE_PROVIDER_SITE_OTHER): Payer: Medicare Other

## 2013-02-04 ENCOUNTER — Other Ambulatory Visit: Payer: Self-pay

## 2013-02-04 DIAGNOSIS — R0602 Shortness of breath: Secondary | ICD-10-CM

## 2013-02-04 DIAGNOSIS — I359 Nonrheumatic aortic valve disorder, unspecified: Secondary | ICD-10-CM

## 2013-02-04 DIAGNOSIS — Z952 Presence of prosthetic heart valve: Secondary | ICD-10-CM

## 2013-02-04 DIAGNOSIS — R079 Chest pain, unspecified: Secondary | ICD-10-CM

## 2013-02-05 LAB — REMOTE PACEMAKER DEVICE
AL THRESHOLD: 1 V
ATRIAL PACING PM: 20
BAMS-0001: 180 {beats}/min
BAMS-0003: 70 {beats}/min
DEVICE MODEL PM: 7284703
RV LEAD AMPLITUDE: 8.4 mv
RV LEAD THRESHOLD: 1 V

## 2013-02-14 ENCOUNTER — Encounter: Payer: Self-pay | Admitting: *Deleted

## 2013-02-24 ENCOUNTER — Other Ambulatory Visit: Payer: Self-pay

## 2013-02-28 ENCOUNTER — Telehealth: Payer: Self-pay

## 2013-02-28 MED ORDER — FUROSEMIDE 40 MG PO TABS: 40.0000 mg | ORAL_TABLET | Freq: Every day | ORAL | Status: DC

## 2013-02-28 NOTE — Telephone Encounter (Signed)
Pt has been taking furosemide daily as directed and says SOB has improved Says furosemide she has is expired and she just realized this Will send in new RX now She will come for labs (BMP) in a couple of weeks

## 2013-02-28 NOTE — Telephone Encounter (Signed)
Pt states she is returning your call °

## 2013-03-01 ENCOUNTER — Telehealth: Payer: Self-pay | Admitting: Physician Assistant

## 2013-03-01 NOTE — Telephone Encounter (Signed)
Patient stated she had sudden onset of severe leg pain. She also has some issues with numbness but the sound more chronic. She had taken an extra potassium tablet but not an extra Lasix tablet. She had taken a hydrocodone for the pain in her leg. The pain is going away. The patient wanted to know what to do.  I advised her she had done the correct thing by taking a pain pill for the pain. I advised her that one extra potassium tablet would not cause her any significant harm. The patient felt that her symptoms were still significant so I requested that she go to the closest emergency room for evaluation. The patient stated she would do so.

## 2013-03-01 NOTE — Telephone Encounter (Signed)
Agree with advice given

## 2013-03-02 NOTE — Telephone Encounter (Signed)
She should chat with PMG about stretching, may need muscle relaxer pill

## 2013-03-03 NOTE — Telephone Encounter (Signed)
lmtcb

## 2013-03-12 ENCOUNTER — Telehealth: Payer: Self-pay

## 2013-03-12 NOTE — Telephone Encounter (Signed)
Pt says she has been taking lasix QD as discussed a few weeks ago She says she feels nauseated when she takes this daily It is time to check BMP She says she cannot come in today but can come tomm am We will check BMP tomm to assess K, etc She will hold lasix today and tomm to see if nausea improves Says sob is unchanged

## 2013-03-12 NOTE — Telephone Encounter (Signed)
FYI

## 2013-03-12 NOTE — Telephone Encounter (Signed)
lmtcb

## 2013-03-12 NOTE — Telephone Encounter (Signed)
Call to assess pt and see if she needs BMP

## 2013-03-12 NOTE — Telephone Encounter (Signed)
Message copied by [facility], Tami Barren E on Wed Mar 12, 2013  8:22 AM ------      Message from: [facility], Race Latour E      Created: Wed Feb 19, 2013 12:56 PM      Regarding: ? BMP       See echo result notes ------

## 2013-03-12 NOTE — Telephone Encounter (Signed)
pts husband called back, asks that I call pt at [phone]

## 2013-03-12 NOTE — Telephone Encounter (Signed)
Would agree, let's check basic metabolic panel.

## 2013-03-13 ENCOUNTER — Ambulatory Visit (INDEPENDENT_AMBULATORY_CARE_PROVIDER_SITE_OTHER): Payer: Medicare Other

## 2013-03-13 DIAGNOSIS — Z79899 Other long term (current) drug therapy: Secondary | ICD-10-CM

## 2013-03-13 DIAGNOSIS — I442 Atrioventricular block, complete: Secondary | ICD-10-CM

## 2013-03-13 DIAGNOSIS — I10 Essential (primary) hypertension: Secondary | ICD-10-CM

## 2013-03-13 DIAGNOSIS — Z953 Presence of xenogenic heart valve: Secondary | ICD-10-CM

## 2013-03-14 ENCOUNTER — Telehealth: Payer: Self-pay

## 2013-03-14 LAB — BASIC METABOLIC PANEL
CO2: 24 mmol/L (ref 19–28)
Calcium: 9.9 mg/dL (ref 8.6–10.2)
Chloride: 105 mmol/L (ref 97–108)
GFR calc non Af Amer: 48 mL/min/{1.73_m2} — ABNORMAL LOW (ref >59)
Glucose: 138 mg/dL — ABNORMAL HIGH (ref 65–99)
Potassium: 4.6 mmol/L (ref 3.5–5.2)
Sodium: 146 mmol/L — ABNORMAL HIGH (ref 134–144)

## 2013-03-14 NOTE — Telephone Encounter (Signed)
Discussing with TG

## 2013-03-14 NOTE — Telephone Encounter (Signed)
I discussed with Dr. Mariah Milling who says chest pressure at hs only does not sound cardiac and may need to refer back to PCP. He sees no reason for nausea in regards to labs Suggests pt continue taking lasix qd x a few more days and f/u with PCP He asks that we send notes and labs to Dr. Daniel Nones.

## 2013-03-14 NOTE — Telephone Encounter (Signed)
Pt states she is still very nauseated, would like results from yesterday. Please advise

## 2013-03-14 NOTE — Telephone Encounter (Signed)
lmtcb

## 2013-03-17 NOTE — Telephone Encounter (Signed)
appt made with Dr. Daniel Nones for tomm at 4:15 pm I will call pt to inform

## 2013-03-17 NOTE — Telephone Encounter (Signed)
Pt informed Understanding verb She would like me to call Dr. Odessa Fleming office for her to make appt I will do this and call her back

## 2013-03-17 NOTE — Telephone Encounter (Signed)
Pt informed

## 2013-04-09 ENCOUNTER — Other Ambulatory Visit: Payer: Self-pay

## 2013-04-14 ENCOUNTER — Inpatient Hospital Stay: Payer: Self-pay | Admitting: Internal Medicine

## 2013-04-14 LAB — COMPREHENSIVE METABOLIC PANEL
Albumin: 3.9 g/dL (ref 3.4–5.0)
BUN: 11 mg/dL (ref 7–18)
Bilirubin,Total: 0.5 mg/dL (ref 0.2–1.0)
Calcium, Total: 9.6 mg/dL (ref 8.5–10.1)
EGFR (African American): >60
EGFR (Non-African Amer.): >60
Osmolality: 283 (ref 275–301)
Potassium: 3.3 mmol/L — ABNORMAL LOW (ref 3.5–5.1)
SGOT(AST): 27 U/L (ref 15–37)
SGPT (ALT): 27 U/L (ref 12–78)
Sodium: 142 mmol/L (ref 136–145)
Total Protein: 7.9 g/dL (ref 6.4–8.2)

## 2013-04-14 LAB — CBC WITH DIFFERENTIAL/PLATELET
Basophil #: 0 10*3/uL (ref 0.0–0.1)
Eosinophil #: 0.1 10*3/uL (ref 0.0–0.7)
Eosinophil %: 1.3 %
HCT: 35 % (ref 35.0–47.0)
HGB: 11.5 g/dL — ABNORMAL LOW (ref 12.0–16.0)
Lymphocyte #: 2.3 10*3/uL (ref 1.0–3.6)
Lymphocyte %: 26.6 %
MCHC: 32.9 g/dL (ref 32.0–36.0)
Monocyte #: 0.7 x10 3/mm (ref 0.2–0.9)
RBC: 4.26 10*6/uL (ref 3.80–5.20)
RDW: 15.7 % — ABNORMAL HIGH (ref 11.5–14.5)
WBC: 8.7 10*3/uL (ref 3.6–11.0)

## 2013-04-14 LAB — URINALYSIS, COMPLETE
Bilirubin,UR: NEGATIVE
Glucose,UR: NEGATIVE mg/dL (ref 0–75)
Nitrite: NEGATIVE
Ph: 7 (ref 4.5–8.0)
Protein: NEGATIVE
RBC,UR: 2 /HPF (ref 0–5)
Specific Gravity: 1.018 (ref 1.003–1.030)
Squamous Epithelial: 1
WBC UR: 3 /HPF (ref 0–5)

## 2013-04-14 LAB — TSH: Thyroid Stimulating Horm: 0.987 u[IU]/mL

## 2013-04-15 LAB — CBC WITH DIFFERENTIAL/PLATELET
Basophil #: 0 10*3/uL (ref 0.0–0.1)
Basophil %: 0.7 %
HCT: 29.3 % — ABNORMAL LOW (ref 35.0–47.0)
HGB: 9.6 g/dL — ABNORMAL LOW (ref 12.0–16.0)
Lymphocyte %: 30.3 %
MCH: 27 pg (ref 26.0–34.0)
Monocyte #: 0.6 x10 3/mm (ref 0.2–0.9)
Monocyte %: 9 %
Neutrophil %: 57.6 %
Platelet: 244 10*3/uL (ref 150–440)
WBC: 6.4 10*3/uL (ref 3.6–11.0)

## 2013-04-15 LAB — BASIC METABOLIC PANEL
Anion Gap: 7 (ref 7–16)
BUN: 9 mg/dL (ref 7–18)
Chloride: 105 mmol/L (ref 98–107)
Creatinine: 0.65 mg/dL (ref 0.60–1.30)
EGFR (Non-African Amer.): >60
Glucose: 101 mg/dL — ABNORMAL HIGH (ref 65–99)
Osmolality: 280 (ref 275–301)

## 2013-04-16 LAB — HEPATIC FUNCTION PANEL A (ARMC)
Albumin: 3.2 g/dL — ABNORMAL LOW (ref 3.4–5.0)
Alkaline Phosphatase: 77 U/L (ref 50–136)
Bilirubin, Direct: <0.1 mg/dL (ref 0.00–0.20)
Bilirubin,Total: 0.5 mg/dL (ref 0.2–1.0)
SGPT (ALT): 23 U/L (ref 12–78)
Total Protein: 6.6 g/dL (ref 6.4–8.2)

## 2013-04-16 LAB — LIPASE, BLOOD: Lipase: 289 U/L (ref 73–393)

## 2013-04-16 LAB — PLATELET COUNT: Platelet: 273 10*3/uL (ref 150–440)

## 2013-04-16 LAB — POTASSIUM: Potassium: 3.7 mmol/L (ref 3.5–5.1)

## 2013-04-16 LAB — HEMATOCRIT: HCT: 32.3 % — ABNORMAL LOW (ref 35.0–47.0)

## 2013-04-17 LAB — URINALYSIS, COMPLETE
Bacteria: NONE SEEN
Bilirubin,UR: NEGATIVE
Blood: NEGATIVE
Glucose,UR: NEGATIVE mg/dL (ref 0–75)
Ketone: NEGATIVE
Nitrite: NEGATIVE
Ph: 5 (ref 4.5–8.0)
Protein: NEGATIVE
RBC,UR: 2 /HPF (ref 0–5)
Squamous Epithelial: 4

## 2013-04-17 LAB — BASIC METABOLIC PANEL
Anion Gap: 6 — ABNORMAL LOW (ref 7–16)
Chloride: 108 mmol/L — ABNORMAL HIGH (ref 98–107)
Co2: 26 mmol/L (ref 21–32)
Creatinine: 0.69 mg/dL (ref 0.60–1.30)
EGFR (African American): >60
EGFR (Non-African Amer.): >60
Glucose: 123 mg/dL — ABNORMAL HIGH (ref 65–99)
Potassium: 3.3 mmol/L — ABNORMAL LOW (ref 3.5–5.1)

## 2013-04-20 LAB — CULTURE, BLOOD (SINGLE)

## 2013-04-29 ENCOUNTER — Ambulatory Visit (INDEPENDENT_AMBULATORY_CARE_PROVIDER_SITE_OTHER): Payer: Medicare Other | Admitting: *Deleted

## 2013-04-29 ENCOUNTER — Encounter: Payer: Self-pay | Admitting: Internal Medicine

## 2013-04-29 DIAGNOSIS — Z950 Presence of cardiac pacemaker: Secondary | ICD-10-CM

## 2013-04-29 DIAGNOSIS — I4891 Unspecified atrial fibrillation: Secondary | ICD-10-CM

## 2013-04-29 LAB — REMOTE PACEMAKER DEVICE
AL IMPEDENCE PM: 380 Ohm
BAMS-0003: 70 {beats}/min
RV LEAD AMPLITUDE: 12 mv
RV LEAD IMPEDENCE PM: 460 Ohm
VENTRICULAR PACING PM: 1

## 2013-05-05 ENCOUNTER — Encounter: Payer: Self-pay | Admitting: *Deleted

## 2013-06-03 ENCOUNTER — Telehealth: Payer: Self-pay | Admitting: Internal Medicine

## 2013-06-03 ENCOUNTER — Telehealth: Payer: Self-pay | Admitting: *Deleted

## 2013-06-03 ENCOUNTER — Encounter: Payer: Self-pay | Admitting: Cardiovascular Disease

## 2013-06-03 ENCOUNTER — Ambulatory Visit (INDEPENDENT_AMBULATORY_CARE_PROVIDER_SITE_OTHER): Payer: Medicare Other | Admitting: Cardiovascular Disease

## 2013-06-03 VITALS — BP 146/60 | HR 65 | Ht 64.0 in | Wt 226.8 lb

## 2013-06-03 DIAGNOSIS — R531 Weakness: Secondary | ICD-10-CM | POA: Insufficient documentation

## 2013-06-03 DIAGNOSIS — I2510 Atherosclerotic heart disease of native coronary artery without angina pectoris: Secondary | ICD-10-CM

## 2013-06-03 DIAGNOSIS — Z952 Presence of prosthetic heart valve: Secondary | ICD-10-CM

## 2013-06-03 DIAGNOSIS — R5383 Other fatigue: Secondary | ICD-10-CM

## 2013-06-03 DIAGNOSIS — I10 Essential (primary) hypertension: Secondary | ICD-10-CM

## 2013-06-03 DIAGNOSIS — R0602 Shortness of breath: Secondary | ICD-10-CM

## 2013-06-03 DIAGNOSIS — Z79899 Other long term (current) drug therapy: Secondary | ICD-10-CM

## 2013-06-03 DIAGNOSIS — I5032 Chronic diastolic (congestive) heart failure: Secondary | ICD-10-CM

## 2013-06-03 DIAGNOSIS — R0609 Other forms of dyspnea: Secondary | ICD-10-CM

## 2013-06-03 DIAGNOSIS — Z953 Presence of xenogenic heart valve: Secondary | ICD-10-CM

## 2013-06-03 DIAGNOSIS — R5381 Other malaise: Secondary | ICD-10-CM

## 2013-06-03 DIAGNOSIS — R0600 Dyspnea, unspecified: Secondary | ICD-10-CM

## 2013-06-03 NOTE — Progress Notes (Signed)
Patient ID: Michele Schultz, female    DOB: 1931/09/15, 79 y.o.   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 She did have postoperative anemia and difficulty mobilizing.   Following her surgery, she had  severe back pain, spinal stenosis. She  had epidural shots x2 with mild relief    Husband is in declining health and living at [facility] health care. She tries to take care of him 2 days per week.   She has chronic back pain, chronic knee pain and hip pain. She has had bilateral  knee and hip replacement.  She also has chronic shortness of breath.   She reports needing help at home, particularly with bathing. Legs are weak, she has chronic pain. She has lost weight from recent diarrhea, nausea vomiting. She was admitted to [facility] and received IV fluids for dehydration. GI issues have continued. Only mildly better. Denies any significant chest pain. She does have upper airway wheezing.  Reports having trouble with her pacer downloaded box which was beeping. [facility] has been notified per the patient.  EKG shows right bundle branch block, normal sinus rhythm with rate 65 beats per minute    Outpatient Encounter Prescriptions as of 06/03/2013  Medication Sig Dispense Refill  . amLODipine (NORVASC) 5 MG tablet Take 1 tablet (5 mg total) by mouth daily.  30 tablet  6  . aspirin 81 MG tablet Take 81 mg by mouth daily.        Marland Kitchen atorvastatin (LIPITOR) 40 MG tablet Take 40 mg by mouth daily.        Marland Kitchen azelastine  (ASTELIN) 137 MCG/SPRAY nasal spray Place 1 spray into the nose 2 (two) times daily. Use in each nostril as directed      . benzonatate (TESSALON) 100 MG capsule Take 200 mg by mouth 3 (three) times daily as needed.      . Cyanocobalamin (VITAMIN B-12) 1000 MCG/15ML LIQD Inject 1,000 mcg as directed every 30 (thirty) days.        . cycloSPORINE (RESTASIS) 0.05 % ophthalmic emulsion Place 1 drop into both eyes 2 (two) times daily.       . furosemide (LASIX) 40 MG tablet Take 1 tablet (40 mg total) by mouth daily.  90 tablet  3  . gabapentin (NEURONTIN) 100 MG capsule Take two tablets by mouth at bedtime.      Marland Kitchen HYDROcodone-acetaminophen (NORCO) 5-325 MG per tablet Take 2 tablets by mouth every 4 (four) hours as needed.      Marland Kitchen losartan (COZAAR) 25 MG tablet Take 1 tablet (25 mg total) by mouth daily.  25 tablet  90  . magnesium hydroxide (MILK OF MAGNESIA) 400 MG/5ML suspension Take by mouth daily as needed.      . metoprolol succinate (TOPROL-XL) 50 MG 24 hr tablet TAKE 1 TABLET DAILY (8AM)  30 tablet  0  . nitroGLYCERIN (NITROSTAT) 0.4 MG SL tablet Place 1 tablet (0.4 mg total) under the tongue every 5 (five) minutes as needed for chest pain.  90 tablet  3  . pantoprazole (PROTONIX) 40 MG tablet Take 40 mg by mouth 2 (two) times daily.        . potassium chloride (K-DUR) 10 MEQ tablet Take 1 tablet (10 mEq total) by mouth daily.  90 tablet  3   No facility-administered encounter medications on file as of 06/03/2013.    Review of Systems  Constitutional: Negative.   HENT: Negative.   Eyes: Negative.   Respiratory: Positive for shortness of breath and wheezing.   Cardiovascular: Positive for chest pain.  Gastrointestinal: Negative.   Musculoskeletal: Positive for back pain, arthralgias and gait problem.  Skin: Negative.   Neurological: Negative.   Psychiatric/Behavioral: Negative.   All other systems reviewed and are negative.   BP 146/60  Pulse 65  Ht 5\' 4"  (1.626 m)  Wt 226 lb 12 oz  (102.853 kg)  BMI 38.9 kg/m2  SpO2 94%  Physical Exam  Nursing note and vitals reviewed. Constitutional: She is oriented to person, place, and time. She appears well-developed and well-nourished.  Obese  HENT:  Head: Normocephalic.  Nose: Nose normal.  Mouth/Throat: Oropharynx is clear and moist.  Eyes: Conjunctivae are normal. Pupils are equal, round, and reactive to light.  Neck: Normal range of motion. Neck supple. No JVD present.  Cardiovascular: Normal rate, regular rhythm, S1 normal, S2 normal and intact distal pulses.  Exam reveals no gallop and no friction rub.   Murmur heard.  Systolic murmur is present with a grade of 2/6  Well-healed midline incision  Pulmonary/Chest: Effort normal. No respiratory distress. She has wheezes. She has no rales. She exhibits no tenderness.  Abdominal: Soft. Bowel sounds are normal. She exhibits no distension. There is no tenderness.  Musculoskeletal: Normal range of motion. She exhibits no edema and no tenderness.  Lymphadenopathy:    She has no cervical adenopathy.  Neurological: She is alert and oriented to person, place, and time. Coordination normal.  Skin: Skin is warm and dry. No rash noted. No erythema.  Psychiatric: She has a normal mood and affect. Her behavior is normal. Judgment and thought content normal.    Assessment and Plan

## 2013-06-03 NOTE — Assessment & Plan Note (Signed)
Generalized weakness on today's visit. Chronic pain, now with chronic GI issues. Unable to care for herself at home. Lives alone, or lies on others to help her with groceries. Does not sound as if she has family. We'll see what we can request from home health. Significant polypharmacy is a concern.

## 2013-06-03 NOTE — Patient Instructions (Addendum)
You are doing well. No medication changes were made.  We will try to schedule home health  Please call us if you have new issues that need to be addressed before your next appt.  Your physician wants you to follow-up in: 6 months.  You will receive a reminder letter in the mail two months in advance. If you don't receive a letter, please call our office to schedule the follow-up appointment.

## 2013-06-03 NOTE — Assessment & Plan Note (Signed)
Currently with no symptoms of angina. No further workup at this time. Continue current medication regimen. 

## 2013-06-03 NOTE — Assessment & Plan Note (Signed)
Chronic shortness of breath. Likely due to obesity and deconditioning. No overt diastolic CHF.

## 2013-06-03 NOTE — Assessment & Plan Note (Signed)
Blood pressure is well controlled on today's visit. No changes made to the medications. 

## 2013-06-03 NOTE — Telephone Encounter (Signed)
New Prob   Pt calling in wanting to speak to [facility] regarding a call she received yesterday. Please call.

## 2013-06-03 NOTE — Telephone Encounter (Signed)
Spoke with patient.  I will stop by this week and troubleshoot her transmitter.

## 2013-06-03 NOTE — Telephone Encounter (Signed)
Pt called with concerns about her PPM telephonic machine, says "lights are blinking and it is making a strange noise" She has already called [place] [facility] and LMTCB I advised she await their call about this to help troubleshoot  She also mentions worsening LE edema and sob and asks if she can be seen today We have an opening at 3:30 today appt made for today

## 2013-06-03 NOTE — Assessment & Plan Note (Signed)
Periodic echocardiography.  Repeat next year

## 2013-06-03 NOTE — Assessment & Plan Note (Signed)
She did report some lower stone edema, this has now resolved. She's not taking her Lasix as she continues to have GI problems, diarrhea

## 2013-06-09 ENCOUNTER — Telehealth: Payer: Self-pay | Admitting: Internal Medicine

## 2013-06-09 NOTE — Telephone Encounter (Signed)
New Problem:    Patient called in because she followed her instructions and her transmission device is blinking and making noises and has been since she pressed the button.  Please call back.

## 2013-06-09 NOTE — Telephone Encounter (Signed)
Patient unable to transmit from [facility] although the unit was reset.  I will troubleshoot with her later today.

## 2013-06-12 ENCOUNTER — Telehealth: Payer: Self-pay

## 2013-06-12 MED ORDER — METOPROLOL SUCCINATE ER 50 MG PO TB24: ORAL_TABLET | ORAL | Status: DC

## 2013-06-12 NOTE — Telephone Encounter (Signed)
Refill sent for metoprolol.  

## 2013-06-16 ENCOUNTER — Emergency Department: Payer: Self-pay | Admitting: Emergency Medicine

## 2013-06-16 ENCOUNTER — Telehealth: Payer: Self-pay | Admitting: *Deleted

## 2013-06-16 LAB — BASIC METABOLIC PANEL
Chloride: 108 mmol/L — ABNORMAL HIGH (ref 98–107)
EGFR (Non-African Amer.): >60
Osmolality: 283 (ref 275–301)
Potassium: 3.6 mmol/L (ref 3.5–5.1)
Sodium: 142 mmol/L (ref 136–145)

## 2013-06-16 LAB — TROPONIN I
Troponin-I: <0.02 ng/mL
Troponin-I: <0.02 ng/mL

## 2013-06-16 LAB — CBC
HCT: 33.9 % — ABNORMAL LOW (ref 35.0–47.0)
HGB: 10.9 g/dL — ABNORMAL LOW (ref 12.0–16.0)
MCH: 26 pg (ref 26.0–34.0)
Platelet: 273 10*3/uL (ref 150–440)
RBC: 4.18 10*6/uL (ref 3.80–5.20)
WBC: 6.9 10*3/uL (ref 3.6–11.0)

## 2013-06-16 LAB — CK TOTAL AND CKMB (NOT AT ARMC): CK, Total: 115 U/L (ref 21–215)

## 2013-06-16 LAB — PROTIME-INR: Prothrombin Time: 13.4 secs (ref 11.5–14.7)

## 2013-06-16 NOTE — Telephone Encounter (Signed)
Received call from Michele Schultz, home health nurse with [facility] who is in the patient's home to evaluate for chest pain.  Michele Schultz states that the patient's BP is 170/80, HR 68 after walking across the room.  She reports that the patient is experiencing SOB but that this is not unusual for the patient.  Michele Schultz states that the patient's skin is clammy, patient is afebrile and does not have hx of diabetes.  She reports that the patient does not currently have lower extremity swelling. I advised Michele Schultz that I would page Dr. Mariah Milling to find out if he would like to order any lab work as patient continues to refuse to go to the ED.  Dr. Mariah Milling was updated on the patient's condition and he ordered cardiac enzymes, BNP, and troponin if patient would not go to the hospital.  He states that if blood is taken to [facility] that he would f/u.  I called Michele Schultz back to report Dr. Windell Hummingbird orders and Michele Schultz states patient is now willing to go to the ED.  I thanked Rankin for her help.

## 2013-06-16 NOTE — Telephone Encounter (Signed)
Spoke with patient who c/o chest pain since Friday.  Patient states it feels like "gas pain."  Patient asked to give further description and she states she doesn't know how else to describe it except that it is "sharp" at times.  Patient states that SOB is the same as it was when she saw Dr. Mariah Milling on 7/1. Patient states that she took Norco which helped "some"; states took a NTG on Saturday and that it did not help.  Patient refused to take another NTG.  Patient states she is always nauseated; c/o breaking out in sweat frequently and swelling in bilateral feet.  Patient states BP 140/80, HR in the 80's.  Patient states she has a nursing aide in the home with her today; states she called the home health nurse who advised her to call us.  Patient wants to be seen in the [place] office.  I advised patient that there are no available appointments today and that we advise that she go to the emergency department of her local hospital.  Patient states she is not going to the ED and that she wants to see Dr. Mariah Milling sometime this week.  I advised patient that it is our recommendation that she seek emergency medical attention; patient refused.  Patient asked that I send message to Dr. Mariah Milling to see if he can see her sometime this week.  I am routing message to Dr. Mariah Milling.

## 2013-06-16 NOTE — Telephone Encounter (Signed)
Patient complaining of chest pain off and on for past 3 days, she called her home health RN and they advised her to call Dr. Windell Hummingbird office to see what she can do.

## 2013-06-19 ENCOUNTER — Ambulatory Visit (INDEPENDENT_AMBULATORY_CARE_PROVIDER_SITE_OTHER): Payer: Medicare Other | Admitting: Cardiovascular Disease

## 2013-06-19 ENCOUNTER — Encounter: Payer: Self-pay | Admitting: Cardiovascular Disease

## 2013-06-19 VITALS — BP 142/88 | HR 72 | Ht 64.0 in | Wt 227.0 lb

## 2013-06-19 DIAGNOSIS — Z951 Presence of aortocoronary bypass graft: Secondary | ICD-10-CM

## 2013-06-19 DIAGNOSIS — R0602 Shortness of breath: Secondary | ICD-10-CM

## 2013-06-19 DIAGNOSIS — M791 Myalgia, unspecified site: Secondary | ICD-10-CM

## 2013-06-19 DIAGNOSIS — Z953 Presence of xenogenic heart valve: Secondary | ICD-10-CM

## 2013-06-19 DIAGNOSIS — R079 Chest pain, unspecified: Secondary | ICD-10-CM

## 2013-06-19 DIAGNOSIS — Z952 Presence of prosthetic heart valve: Secondary | ICD-10-CM

## 2013-06-19 DIAGNOSIS — E785 Hyperlipidemia, unspecified: Secondary | ICD-10-CM

## 2013-06-19 DIAGNOSIS — R062 Wheezing: Secondary | ICD-10-CM | POA: Insufficient documentation

## 2013-06-19 DIAGNOSIS — IMO0001 Reserved for inherently not codable concepts without codable children: Secondary | ICD-10-CM

## 2013-06-19 MED ORDER — IPRATROPIUM-ALBUTEROL 18-103 MCG/ACT IN AERO: 2.0000 | INHALATION_SPRAY | Freq: Four times a day (QID) | RESPIRATORY_TRACT | Status: DC | PRN

## 2013-06-19 NOTE — Patient Instructions (Addendum)
Please discontinue the atorvastatin (cholesterol pill)) to see if the pain resolves Start the lyrica as per the pain clinic Take combivent for wheezing or shortness of breath, Ok to take an extra furosemide (water pill)) for leg swelling or shortness of breath  Please call us if you have new issues that need to be addressed before your next appt.  Your physician wants you to follow-up in: 3 months.  You will receive a reminder letter in the mail two months in advance. If you don't receive a letter, please call our office to schedule the follow-up appointment.

## 2013-06-19 NOTE — Assessment & Plan Note (Signed)
We'll repeat ultrasound next year. Last echo showing valve was well seated.

## 2013-06-19 NOTE — Assessment & Plan Note (Signed)
She does report having chest pain, tachycardia with movement. Symptoms suggestive of musculoskeletal etiology. Unable to exclude malunion of her sternum. We will hold her statin, continue on her pain medication under the guidance of the pancreatic, she will start Lyrica. If symptoms persist, x-ray of her mediastinum could be done to rule out malunion of her sternum. She does not want this fixed even if she has this and would prefer pain medication.

## 2013-06-19 NOTE — Assessment & Plan Note (Signed)
She reports having diffuse pain, arms legs and chest. Also in her joints. We will hold her generic Lipitor to see if this helps her symptoms. She's also starting on Lyrica and currently on hydrocodone.

## 2013-06-19 NOTE — Assessment & Plan Note (Signed)
She reports having chronic wheezing, and on clinical exam this is in her upper airway. Could be secondary to body habitus. We have given her prescription for Combivent to take only as needed for significant wheezing or shortness of breath.

## 2013-06-19 NOTE — Progress Notes (Signed)
Patient ID: Michele Schultz, female    DOB: [birth year], 79 y.o.   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 She did have postoperative anemia and difficulty mobilizing.  Following her surgery, she had  severe back pain, spinal stenosis. She  had epidural shots x2 with mild relief    Husband is in declining health and living at [facility] care. She tries to take care of him 2 days per week.   She has chronic back pain, chronic knee pain and hip pain. She has had bilateral  knee and hip replacement.  She also has chronic shortness of breath.   On her last clinic visit, she needed help at home with bathing, legs are getting weaker and she continues to battle with chronic pain. Episode of diarrhea, nausea vomiting. She was admitted to [facility] and received IV fluids for dehydration. She has chronic  upper airway wheezing.   More recently, has diffuse pain all over in her arms, legs, back, particularly her chest. Had difficulty getting out of bed without chest pain. Reproducible chest pain with certain maneuvers as well as pain in her arms and legs, muscles and joints. She is on hydrocodone for pain and reports having night sweats. Recently seen by the pain clinic. She is scheduled to start Lyrica today with a slow wean off her hydrocodone in the future.  EKG shows right bundle branch block, normal sinus rhythm with rate 72 beats per minute    Outpatient Encounter Prescriptions as of 06/19/2013  Medication Sig Dispense Refill  .  amLODipine (NORVASC) 5 MG tablet Take 1 tablet (5 mg total) by mouth daily.  30 tablet  6  . aspirin 81 MG tablet Take 81 mg by mouth daily.        Marland Kitchen azelastine (ASTELIN) 137 MCG/SPRAY nasal spray Place 1 spray into the nose 2 (two) times daily. Use in each nostril as directed      . benzonatate (TESSALON) 100 MG capsule Take 200 mg by mouth 3 (three) times daily as needed.      . Cyanocobalamin (VITAMIN B-12) 1000 MCG/15ML LIQD Inject 1,000 mcg as directed every 30 (thirty) days.        . cycloSPORINE (RESTASIS) 0.05 % ophthalmic emulsion Place 1 drop into both eyes 2 (two) times daily.       . furosemide (LASIX) 40 MG tablet Take 1 tablet (40 mg total) by mouth daily.  90 tablet  3  . gabapentin (NEURONTIN) 100 MG capsule Take two tablets by mouth at bedtime.      Marland Kitchen HYDROcodone-acetaminophen (NORCO) 5-325 MG per tablet Take 2 tablets by mouth every 4 (four) hours as needed.      Marland Kitchen losartan (COZAAR) 25 MG tablet Take 1 tablet (25 mg total) by mouth daily.  25 tablet  90  . magnesium hydroxide (MILK OF MAGNESIA) 400 MG/5ML suspension Take by mouth daily as needed.      . metoprolol succinate (TOPROL-XL) 50 MG 24 hr tablet TAKE 1 TABLET DAILY (8AM)  90 tablet  3  .  nitroGLYCERIN (NITROSTAT) 0.4 MG SL tablet Place 1 tablet (0.4 mg total) under the tongue every 5 (five) minutes as needed for chest pain.  90 tablet  3  . pantoprazole (PROTONIX) 40 MG tablet Take 40 mg by mouth 2 (two) times daily.        . potassium chloride (K-DUR) 10 MEQ tablet Take 1 tablet (10 mEq total) by mouth daily.  90 tablet  3  . pregabalin (LYRICA) 50 MG capsule Take 50 mg by mouth 3 (three) times daily.      . [DISCONTINUED] atorvastatin (LIPITOR) 40 MG tablet Take 40 mg by mouth daily.        Marland Kitchen albuterol-ipratropium (COMBIVENT) 18-103 MCG/ACT inhaler Inhale 2 puffs into the lungs every 6 (six) hours as needed for wheezing.  1 Inhaler  4   No facility-administered encounter medications on file as of 06/19/2013.     Review of Systems  Constitutional: Negative.   HENT: Negative.   Eyes: Negative.   Respiratory: Positive for wheezing.   Cardiovascular: Positive for chest pain.  Gastrointestinal: Negative.   Musculoskeletal: Positive for myalgias, back pain, arthralgias and gait problem.  Skin: Negative.   Neurological: Negative.   Psychiatric/Behavioral: Negative.   All other systems reviewed and are negative.   BP 142/88  Pulse 72  Ht 5\' 4"  (1.626 m)  Wt 227 lb (102.967 kg)  BMI 38.95 kg/m2  Physical Exam  Nursing note and vitals reviewed. Constitutional: She is oriented to person, place, and time. She appears well-developed and well-nourished.  Obese  HENT:  Head: Normocephalic.  Nose: Nose normal.  Mouth/Throat: Oropharynx is clear and moist.  Eyes: Conjunctivae are normal. Pupils are equal, round, and reactive to light.  Neck: Normal range of motion. Neck supple. No JVD present.  Cardiovascular: Normal rate, regular rhythm, S1 normal, S2 normal and intact distal pulses.  Exam reveals no gallop and no friction rub.   Murmur heard.  Systolic murmur is present with a grade of 2/6  Well-healed midline incision  Pulmonary/Chest: Effort normal. No respiratory distress. She has wheezes. She has no rales. She exhibits no tenderness.  Abdominal: Soft. Bowel sounds are normal. She exhibits no distension. There is no tenderness.  Musculoskeletal: Normal range of motion. She exhibits no edema and no tenderness.  Lymphadenopathy:    She has no cervical adenopathy.  Neurological: She is alert and oriented to person, place, and time. Coordination normal.  Skin: Skin is warm and dry. No rash noted. No erythema.  Psychiatric: She has a normal mood and affect. Her behavior is normal. Judgment and thought content normal.    Assessment and Plan

## 2013-06-19 NOTE — Assessment & Plan Note (Signed)
Given her diffuse aching in her muscles and joints, we will hold her statin and wait for symptoms to improve. We could retry an alternate statin at a later date.

## 2013-06-19 NOTE — Assessment & Plan Note (Signed)
Currently with no symptoms of angina. No further workup at this time. Continue current medication regimen. 

## 2013-08-05 ENCOUNTER — Ambulatory Visit (INDEPENDENT_AMBULATORY_CARE_PROVIDER_SITE_OTHER): Payer: Medicare Other | Admitting: *Deleted

## 2013-08-05 DIAGNOSIS — Z950 Presence of cardiac pacemaker: Secondary | ICD-10-CM

## 2013-08-05 DIAGNOSIS — I442 Atrioventricular block, complete: Secondary | ICD-10-CM

## 2013-08-07 LAB — REMOTE PACEMAKER DEVICE
AL AMPLITUDE: 2 mv
AL IMPEDENCE PM: 400 Ohm
AL THRESHOLD: 0.875 V
ATRIAL PACING PM: 17
BAMS-0001: 180 {beats}/min
BAMS-0003: 70 {beats}/min
BATTERY VOLTAGE: 2.96 V
DEVICE MODEL PM: 7284703
RV LEAD AMPLITUDE: 10.5 mv
RV LEAD IMPEDENCE PM: 560 Ohm
RV LEAD THRESHOLD: 0.875 V
VENTRICULAR PACING PM: 1

## 2013-08-18 NOTE — Progress Notes (Signed)
ppm remote check. All functions normal, full details in [facility].  ROV w/ Dr. Herbert Moors [id number]

## 2013-08-19 ENCOUNTER — Ambulatory Visit: Payer: Self-pay | Admitting: Internal Medicine

## 2013-08-26 ENCOUNTER — Encounter: Payer: Self-pay | Admitting: *Deleted

## 2013-08-27 ENCOUNTER — Ambulatory Visit: Payer: Self-pay | Admitting: Internal Medicine

## 2013-09-03 ENCOUNTER — Encounter: Payer: Self-pay | Admitting: Internal Medicine

## 2013-09-03 ENCOUNTER — Telehealth: Payer: Self-pay | Admitting: *Deleted

## 2013-09-03 ENCOUNTER — Ambulatory Visit: Payer: Medicare Other | Admitting: Cardiovascular Disease

## 2013-09-03 NOTE — Telephone Encounter (Signed)
Home health needs an order for continue social work services

## 2013-09-08 ENCOUNTER — Telehealth: Payer: Self-pay

## 2013-09-11 NOTE — Telephone Encounter (Signed)
Spoke w/ Fulton Mole and gave her verbal order to continue social work services. She states that pt did not have "any food in her house", but pt initially refused help, stating that her niece is caring for her. Fulton Mole states that pt would like some health, but does not wish to leave her home at this time.

## 2013-09-12 NOTE — Telephone Encounter (Signed)
error 

## 2013-09-30 ENCOUNTER — Encounter: Payer: Self-pay | Admitting: Cardiovascular Disease

## 2013-09-30 ENCOUNTER — Ambulatory Visit (INDEPENDENT_AMBULATORY_CARE_PROVIDER_SITE_OTHER): Payer: Medicare Other | Admitting: Cardiovascular Disease

## 2013-09-30 VITALS — BP 148/90 | HR 77 | Ht 64.0 in | Wt 221.5 lb

## 2013-09-30 DIAGNOSIS — M797 Fibromyalgia: Secondary | ICD-10-CM

## 2013-09-30 DIAGNOSIS — Z952 Presence of prosthetic heart valve: Secondary | ICD-10-CM

## 2013-09-30 DIAGNOSIS — I2510 Atherosclerotic heart disease of native coronary artery without angina pectoris: Secondary | ICD-10-CM

## 2013-09-30 DIAGNOSIS — IMO0001 Reserved for inherently not codable concepts without codable children: Secondary | ICD-10-CM

## 2013-09-30 DIAGNOSIS — I4891 Unspecified atrial fibrillation: Secondary | ICD-10-CM

## 2013-09-30 DIAGNOSIS — M549 Dorsalgia, unspecified: Secondary | ICD-10-CM

## 2013-09-30 DIAGNOSIS — Z951 Presence of aortocoronary bypass graft: Secondary | ICD-10-CM

## 2013-09-30 DIAGNOSIS — Z950 Presence of cardiac pacemaker: Secondary | ICD-10-CM

## 2013-09-30 DIAGNOSIS — I5032 Chronic diastolic (congestive) heart failure: Secondary | ICD-10-CM

## 2013-09-30 DIAGNOSIS — Z953 Presence of xenogenic heart valve: Secondary | ICD-10-CM

## 2013-09-30 MED ORDER — GABAPENTIN 100 MG PO CAPS: 200.0000 mg | ORAL_CAPSULE | Freq: Every evening | ORAL | Status: DC | PRN

## 2013-09-30 NOTE — Progress Notes (Signed)
Patient ID: Michele Schultz, female    DOB: [birth year], 79 y.o.   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 She did have postoperative anemia and difficulty mobilizing.  Following her surgery, she had  severe back pain, spinal stenosis. She  had epidural shots x2 with mild relief And on almost every visit, she reports her debilitating leg and body pain. She has tried low-dose Vicodin with minimal relief of her pain. She did not tolerate Lyrica.    Husband is in declining health and living at [facility] care. She was taking care of him several days per week but now her health has declined, pain is too severe and she is getting ready to move into a independent living facility, [facility].  She has chronic back pain, chronic knee pain and hip pain. She has had bilateral  knee and hip replacement.  She also has chronic shortness of breath.   On prior clinic visits, she was living at home and needed help at home with bathing, legs are getting weaker. She has chronic  upper airway wheezing. She denies any significant chest pain concerning for angina.   Previously seen by the  pain clinic at [facility] On her last clinic visit, she was scheduled to start Lyrica  with a slow wean off her hydrocodone in the future. She reports that she did not tolerate her cough. She was told to decrease the dosing of her hydrocodone, one pill every 6 hours. She states this is not covering her pain.  EKG shows right bundle branch block, normal sinus rhythm  with rate 77 beats per minute    Outpatient Encounter Prescriptions as of 09/30/2013  Medication Sig Dispense Refill  . albuterol-ipratropium (COMBIVENT) 18-103 MCG/ACT inhaler Inhale 2 puffs into the lungs every 6 (six) hours as needed for wheezing.  1 Inhaler  4  . amLODipine (NORVASC) 5 MG tablet Take 1 tablet (5 mg total) by mouth daily.  30 tablet  6  . aspirin 81 MG tablet Take 81 mg by mouth daily.        Marland Kitchen azelastine (ASTELIN) 137 MCG/SPRAY nasal spray Place 1 spray into the nose 2 (two) times daily. Use in each nostril as directed      . benzonatate (TESSALON) 100 MG capsule Take 200 mg by mouth 3 (three) times daily as needed.      . Cyanocobalamin (VITAMIN B-12) 1000 MCG/15ML LIQD Inject 1,000 mcg as directed every 30 (thirty) days.        . cycloSPORINE (RESTASIS) 0.05 % ophthalmic emulsion Place 1 drop into both eyes 2 (two) times daily.       . furosemide (LASIX) 40 MG tablet Take 1 tablet (40 mg total) by mouth daily.  90 tablet  3  . gabapentin (NEURONTIN) 100 MG capsule Take two tablets by mouth at bedtime.      Marland Kitchen HYDROcodone-acetaminophen (NORCO) 5-325 MG per tablet Take 2 tablets by mouth every 4 (four) hours as needed.      Marland Kitchen losartan (COZAAR) 25 MG tablet Take 1  tablet (25 mg total) by mouth daily.  25 tablet  90  . magnesium hydroxide (MILK OF MAGNESIA) 400 MG/5ML suspension Take by mouth daily as needed.      . metoprolol succinate (TOPROL-XL) 50 MG 24 hr tablet TAKE 1 TABLET DAILY (8AM)  90 tablet  3  . nitroGLYCERIN (NITROSTAT) 0.4 MG SL tablet Place 1 tablet (0.4 mg total) under the tongue every 5 (five) minutes as needed for chest pain.  90 tablet  3  . pantoprazole (PROTONIX) 40 MG tablet Take 40 mg by mouth 2 (two) times daily.        . potassium chloride (K-DUR) 10 MEQ tablet Take 1 tablet (10 mEq total) by mouth daily.  90 tablet  3  . pregabalin (LYRICA) 50 MG capsule Take 50 mg by mouth 3 (three) times daily.       No facility-administered encounter  medications on file as of 09/30/2013.    Review of Systems  Constitutional: Negative.   HENT: Negative.   Eyes: Negative.   Respiratory: Positive for wheezing.   Gastrointestinal: Negative.   Musculoskeletal: Positive for arthralgias, back pain, gait problem and myalgias.  Skin: Negative.   Neurological: Negative.   Psychiatric/Behavioral: Negative.   All other systems reviewed and are negative.   BP 148/90  Pulse 77  Ht 5\' 4"  (1.626 m)  Wt 221 lb 8 oz (100.472 kg)  BMI 38 kg/m2  Physical Exam  Nursing note and vitals reviewed. Constitutional: She is oriented to person, place, and time. She appears well-developed and well-nourished.  Obese  HENT:  Head: Normocephalic.  Nose: Nose normal.  Mouth/Throat: Oropharynx is clear and moist.  Eyes: Conjunctivae are normal. Pupils are equal, round, and reactive to light.  Neck: Normal range of motion. Neck supple. No JVD present.  Cardiovascular: Normal rate, regular rhythm, S1 normal, S2 normal and intact distal pulses.  Exam reveals no gallop and no friction rub.   Murmur heard.  Systolic murmur is present with a grade of 2/6  Well-healed midline incision  Pulmonary/Chest: Effort normal. No respiratory distress. She has wheezes. She has no rales. She exhibits no tenderness.  Abdominal: Soft. Bowel sounds are normal. She exhibits no distension. There is no tenderness.  Musculoskeletal: Normal range of motion. She exhibits no edema and no tenderness.  Lymphadenopathy:    She has no cervical adenopathy.  Neurological: She is alert and oriented to person, place, and time. Coordination normal.  Skin: Skin is warm and dry. No rash noted. No erythema.  Psychiatric: She has a normal mood and affect. Her behavior is normal. Judgment and thought content normal.    Assessment and Plan

## 2013-09-30 NOTE — Assessment & Plan Note (Signed)
Followed by Dr. Klein 

## 2013-09-30 NOTE — Assessment & Plan Note (Signed)
We'll repeat echocardiogram next year. Prior echocardiogram showing well seated valve

## 2013-09-30 NOTE — Assessment & Plan Note (Signed)
Currently with no symptoms of angina. No further workup at this time. Continue current medication regimen. 

## 2013-09-30 NOTE — Assessment & Plan Note (Signed)
He appears relatively euvolemic on today's visit. Suggested she stay on her Lasix for any leg edema

## 2013-09-30 NOTE — Assessment & Plan Note (Signed)
Her primary concern is her diffuse body pain, including legs and most of her joints. Suspect she has diffuse osteoarthritis, unable to exclude fibromyalgia. She's requesting referral for help. We will schedule her an appointment with the chronic pain clinic in [place]. Uncertain if she would be a candidate for higher dose opiate education or possibly even butrans Patch.

## 2013-09-30 NOTE — Patient Instructions (Signed)
You are doing well. No medication changes were made.  Please call us if you have new issues that need to be addressed before your next appt.  Your physician wants you to follow-up in: 6 months.  You will receive a reminder letter in the mail two months in advance. If you don't receive a letter, please call our office to schedule the follow-up appointment.   

## 2013-10-02 ENCOUNTER — Encounter: Payer: Self-pay | Admitting: Physical Medicine and Rehabilitation

## 2013-10-15 ENCOUNTER — Encounter: Payer: Self-pay | Admitting: Physical Medicine and Rehabilitation

## 2013-10-15 ENCOUNTER — Encounter
Payer: Medicare Other | Attending: Physical Medicine and Rehabilitation | Admitting: Physical Medicine and Rehabilitation

## 2013-10-15 VITALS — BP 177/83 | HR 76 | Resp 14 | Ht 64.0 in | Wt 220.6 lb

## 2013-10-15 DIAGNOSIS — G8929 Other chronic pain: Secondary | ICD-10-CM | POA: Insufficient documentation

## 2013-10-15 DIAGNOSIS — G894 Chronic pain syndrome: Secondary | ICD-10-CM

## 2013-10-15 DIAGNOSIS — Z5181 Encounter for therapeutic drug level monitoring: Secondary | ICD-10-CM

## 2013-10-15 DIAGNOSIS — M549 Dorsalgia, unspecified: Secondary | ICD-10-CM

## 2013-10-15 DIAGNOSIS — I2510 Atherosclerotic heart disease of native coronary artery without angina pectoris: Secondary | ICD-10-CM | POA: Insufficient documentation

## 2013-10-15 DIAGNOSIS — Z79899 Other long term (current) drug therapy: Secondary | ICD-10-CM | POA: Insufficient documentation

## 2013-10-15 NOTE — Progress Notes (Signed)
Subjective:    Patient ID: Michele Schultz, female    DOB: [birth year], 79 y.o.   MRN: [id number]  HPI The patient is a 79 year old female , who presents with pain "all over her body", she can not determine specifically any distinct localization . The symptoms started about 10 month ago. The patient complains about moderate to sometimes severe pain . Patient denies any numbness and tingling  . She describes the pain as constant and aching . Applying heat, taking medications alleviate the symptoms. She can not say that anything aggrevates the symptoms. The patient grades her pain as a  4/10. She reports that she had a DEXA scan done recently and that she was dx with Osteoporosis, we do not have this report yet. She also reports that she is doing [facility] PT, and that she will be moving to assisted living very soon. She has lived on her own until then.The patient is a poor historian, and she has mild memory problems. Hx of total hip replacement on the right Hx of open heart surgery, with valve replacement, and triple bypass in 2012 Pain Inventory Average Pain 5 Pain Right Now 4 My pain is constant and aching  In the last 24 hours, has pain interfered with the following? General activity 4 Relation with others 4 Enjoyment of life 5 What TIME of day is your pain at its worst? constant Sleep (in general) Fair  Pain is worse with: walking and standing Pain improves with: rest and medication Relief from Meds: 5  Mobility walk with assistance use a walker ability to climb steps?  no do you drive?  no  Function not employed: date last employed na retired I need assistance with the following:  bathing, meal prep, household duties and shopping  Neuro/Psych trouble walking depression suicidal thoughts  Prior Studies Any changes since last visit?  no  Physicians involved in your care Any changes since last visit?  no   Family History  Problem Relation Age of Onset  . Diabetes Other    . Heart disease Sister   . Heart disease Brother   . Heart disease Brother   . Heart disease Brother   . Anesthesia problems Neg Hx   . Hypotension Neg Hx   . Malignant hyperthermia Neg Hx   . Pseudochol deficiency Neg Hx    History   Social History  . Marital Status: Married    Spouse Name: N/A    Number of Children: N/A  . Years of Education: N/A   Social History Main Topics  . Smoking status: Never Smoker   . Smokeless tobacco: Never Used  . Alcohol Use: No  . Drug Use: No  . Sexual Activity:    Other Topics Concern  . None   Social History Narrative  . None   Past Surgical History  Procedure Laterality Date  . Replacement total knee  11/2006    right knee  . Nasal sinus surgery  2009  . Total hip arthroplasty  09/2010  . Cardiac catheterization  08/2009    50% stenosis distal left main, 50% stenosis ostial left circumflex.   . Cardiac catheterization      at [facility]  . Eye surgery      IOL/ after cataracts removed- bilateral   . Aortic valve replacement  10/17/2011    Procedure: AORTIC VALVE REPLACEMENT (AVR);  Surgeon: [name], MD;  Location: [facility] OR;  Service: Open Heart Surgery;  Laterality: N/A;  . Coronary  artery bypass graft  10/17/2011    Procedure: CORONARY ARTERY BYPASS GRAFTING (CABG);  Surgeon: [name], MD;  Location: [facility] OR;  Service: Open Heart Surgery;  Laterality: N/A;  Times Two, using right leg greater saphenous vein harvested endoscopically   Past Medical History  Diagnosis Date  . Hypertension   . Anemia   . Neuralgia, neuritis, and radiculitis, unspecified   . Coronary artery disease     s/p CABG 11/12  . Aortic valve disorders     s/p AVR for severe AS 11/12  . Onychia and paronychia of toe   . Osteoarthrosis, unspecified whether generalized or localized, pelvic region and thigh     mainly in her back and knees  . Enthesopathy of hip region   . Esophageal reflux     followed by Dr.Seigal. stabilized with a  combination of Nexium and Zantac  . Knee joint replacement by other means   . Stress incontinence, female     followed by Dr.Cope  . Atrioventricular block, complete   . Cardiac pacemaker -st Judes     11/12  . Chronic diastolic heart failure     ef 40%JWJX 11/12  . Type II or unspecified type diabetes mellitus without mention of complication, not stated as uncontrolled     pt. reports that she is borderline   . Spinal stenosis   . Neuropathy    BP 177/83  Pulse 76  Resp 14  Ht 5\' 4"  (1.626 m)  Wt 220 lb 9.6 oz (100.064 kg)  BMI 37.85 kg/m2  SpO2 98%      Review of Systems  Constitutional: Positive for diaphoresis.  Respiratory: Positive for shortness of breath.   Gastrointestinal: Positive for nausea, vomiting and constipation.  Musculoskeletal: Positive for gait problem.  Psychiatric/Behavioral: Positive for suicidal ideas and dysphoric mood.  All other systems reviewed and are negative.       Objective:   Physical Exam  Constitutional: She is oriented to person, place, and time. She appears well-developed and well-nourished.  HENT:  Head: Normocephalic.  Neck: Neck supple.  Musculoskeletal: She exhibits tenderness.  Neurological: She is alert and oriented to person, place, and time.  Skin: Skin is warm and dry.  Psychiatric: She has a normal mood and affect.   Symmetric normal motor tone is noted throughout. Normal muscle bulk. Muscle testing reveals 5/5 muscle strength of the upper extremity, and 5/5 of the lower extremity, rexcept right iliopsoas and right quadriceps 4/5. Mildly restricted motion in upper and lower extremities, mainly age appropriate. ROM of spine is restricted. Fine motor movements are normal in both hands. Sensory is intact and symmetric to light touch, pinprick and proprioception. DTR in the upper and lower extremity are present and symmetric 2+. No clonus is noted.  Patient arises from chair with difficulty. Narrow based gait with a walker  . SLR negative Tender on palpation around left descending and left ascending trapezius, and left insertion of levator scapulae muscle, No other fibromyalgia tender points.       Assessment & Plan:  This is a 79 year old female with 1.Chronic pain " all over her body", can not determine any specific localization, per patient. Does not have more than 3-4 tender points, from fibro tender points 2.Hx of open heart surgery , valve replacement, triple bypass, on pacemaker 3.Hx of right THR 4. Osteoporosis ? Per patient  Plan : Continue with HH pt, also advised patient that she should ask her PT, whether she could  do some modalities for pain relief like TENS unit, Heat and Massage, I will be happy to put in an order for those modalities for the [facility], otherwise we might order outpatient PT with those modalities, if it can not be done by [facility]. Patient had an allergic reaction with Cymbalta, she also could not tolerated the Lyrica, and is now taking Gabapentin 200mg  at hs. She is also on Hydrocodone 5mg ,  2 tablets q 4 hrs prn pain, which is helping and which she is tolerating well. The patient is a high fall risk, she stated that her doctor told her after she had the DEXA scan, that she has a high risk of having a fracture if she falls. I will ask hillary to get the DEXA scan report. I recommended aquatic therapy for strengthening, pain relief, and balance training, IF her cardiologist approves. Patient also takes Ibuprofen sometimes, but does not tolerate it well. I was thinking about Voltaren gel, but the patient can not localize her pain at all. UDS done today, consider continuing with the Hydrocodone but on a very low dose. Follow up in 1 month

## 2013-10-15 NOTE — Patient Instructions (Addendum)
Please ask your cardiologist whether he would approve aquatic therapy. Please ask your PT to do PT with modalities, like heat, TENS unit and massage for muscle pain relief. If she needs a new order with those direction please call us and I will put in the order. You can also try Arnica cream for your arthritic and muscle pain.

## 2013-11-03 ENCOUNTER — Telehealth: Payer: Self-pay

## 2013-11-03 ENCOUNTER — Telehealth: Payer: Self-pay | Admitting: *Deleted

## 2013-11-03 NOTE — Telephone Encounter (Signed)
Ok will put order in and talked to Merrill to fax it

## 2013-11-03 NOTE — Telephone Encounter (Signed)
Needs orders for aquatic therapy.

## 2013-11-03 NOTE — Telephone Encounter (Signed)
Patient has now gone to assisted living

## 2013-11-03 NOTE — Telephone Encounter (Signed)
Just read the last note, I wrote, that I will order aquatic PT, if cardiologist approves, please find out whether he has , then I will put the order in

## 2013-11-03 NOTE — Telephone Encounter (Signed)
[facility] called to get orders sent to [facility] for aquatic therapy for this patient.  Please place order and fax to [phone].

## 2013-11-03 NOTE — Telephone Encounter (Signed)
Spoke with [facility] nurse.  Advised her we need approval from cardiologist in order to approve aquatic therapy.  Left message for Dr Mariah Milling office patients cardiologist to approve request.

## 2013-11-03 NOTE — Telephone Encounter (Signed)
Would send order for aquatic therapy

## 2013-11-03 NOTE — Telephone Encounter (Signed)
ok 

## 2013-11-04 ENCOUNTER — Telehealth: Payer: Self-pay

## 2013-11-04 NOTE — Telephone Encounter (Signed)
Left message for [facility] to call back.

## 2013-11-04 NOTE — Telephone Encounter (Signed)
Spoke w/ Gillis Ends at [facility].   Gave her verbal order for pt to begin aquatic therapy, per Dr. Mariah Milling.

## 2013-11-04 NOTE — Telephone Encounter (Signed)
Dr Mariah Milling says that it is ok to order aquatic therapy. (message in epic)

## 2013-11-04 NOTE — Telephone Encounter (Signed)
Hassan Rowan RN from Dr. Newton Pigg office called about aquatic therapy order for patient. Left a voicemail for [facility] to call clinic back.

## 2013-11-05 NOTE — Addendum Note (Signed)
Addended by: Su Monks on: 11/05/2013 12:16 PM   Modules accepted: Orders

## 2013-11-05 NOTE — Telephone Encounter (Signed)
Ok, I put the order in.

## 2013-11-06 ENCOUNTER — Encounter: Payer: Medicare Other | Admitting: Internal Medicine

## 2013-11-14 ENCOUNTER — Ambulatory Visit: Payer: Medicare Other | Admitting: Physical Medicine & Rehabilitation

## 2013-11-18 ENCOUNTER — Telehealth: Payer: Self-pay

## 2013-11-18 NOTE — Telephone Encounter (Signed)
Spoke w/ Wilkie Aye @ [facility].  She reports that pt has been experiencing worsening depression. Reports that pt is in quite a bit of pain, as well. States that pt sees Dr. Daniel Nones for primary care and he usually refers to psychiatrist in [place], but pt cancelled her appt for this Friday, as she does not want to drive to [place]. Wilkie Aye states that pt is in need of medications. Informed Wilkie Aye that Dr. Mariah Milling is unable to prescribe antidepressants and she will need to speak w/ PCP or psychiatrist. She verbalizes understanding and will make sure pt keeps appt w/ Dr. Hurman Horn in our office this Thurs for pacer check.

## 2013-11-20 ENCOUNTER — Encounter: Payer: Medicare Other | Admitting: Internal Medicine

## 2013-12-01 ENCOUNTER — Ambulatory Visit: Payer: Medicare Other | Admitting: Physical Medicine & Rehabilitation

## 2013-12-09 ENCOUNTER — Ambulatory Visit (INDEPENDENT_AMBULATORY_CARE_PROVIDER_SITE_OTHER): Payer: Medicare Other | Admitting: Internal Medicine

## 2013-12-09 ENCOUNTER — Encounter: Payer: Self-pay | Admitting: Internal Medicine

## 2013-12-09 VITALS — BP 114/75 | HR 79 | Ht 64.0 in | Wt 222.2 lb

## 2013-12-09 DIAGNOSIS — I2510 Atherosclerotic heart disease of native coronary artery without angina pectoris: Secondary | ICD-10-CM

## 2013-12-09 DIAGNOSIS — Z950 Presence of cardiac pacemaker: Secondary | ICD-10-CM

## 2013-12-09 DIAGNOSIS — I442 Atrioventricular block, complete: Secondary | ICD-10-CM

## 2013-12-09 DIAGNOSIS — I4891 Unspecified atrial fibrillation: Secondary | ICD-10-CM

## 2013-12-09 LAB — MDC_IDC_ENUM_SESS_TYPE_INCLINIC
Brady Statistic RA Percent Paced: 16 %
Date Time Interrogation Session: 20150106095407
Implantable Pulse Generator Model: 2210
Implantable Pulse Generator Serial Number: 7284703
Lead Channel Impedance Value: 512.5 Ohm
Lead Channel Pacing Threshold Amplitude: 0.875 V
Lead Channel Pacing Threshold Pulse Width: 0.4 ms
Lead Channel Pacing Threshold Pulse Width: 0.4 ms
Lead Channel Sensing Intrinsic Amplitude: 12 mV
Lead Channel Setting Sensing Sensitivity: 4 mV
MDC IDC MSMT BATTERY REMAINING LONGEVITY: 116.4 mo
MDC IDC MSMT BATTERY VOLTAGE: 2.96 V
MDC IDC MSMT LEADCHNL RA IMPEDANCE VALUE: 400 Ohm
MDC IDC MSMT LEADCHNL RA PACING THRESHOLD AMPLITUDE: 0.875 V
MDC IDC MSMT LEADCHNL RA SENSING INTR AMPL: 1.4 mV
MDC IDC SET LEADCHNL RA PACING AMPLITUDE: 1.875
MDC IDC SET LEADCHNL RV PACING AMPLITUDE: 1.125
MDC IDC SET LEADCHNL RV PACING PULSEWIDTH: 0.4 ms
MDC IDC STAT BRADY RV PERCENT PACED: 0.12 %

## 2013-12-09 NOTE — Assessment & Plan Note (Signed)
Stable post pacing 

## 2013-12-09 NOTE — Patient Instructions (Addendum)
Remote monitoring is used to monitor your Pacemaker of ICD from home. This monitoring reduces the number of office visits required to check your device to one time per year. It allows [place] to keep an eye on the functioning of your device to ensure it is working properly. You are scheduled for a device check from home on 03/12/14. You may send your transmission at any time that day. If you have a wireless device, the transmission will be sent automatically. After your physician reviews your transmission, you will receive a postcard with your next transmission date.  Your physician wants you to follow-up in: 1 year You will receive a reminder letter in the mail two months in advance. If you don't receive a letter, please call our office to schedule the follow-up appointment.

## 2013-12-09 NOTE — Progress Notes (Signed)
[email or web address] Patient Care Team: Tama High III as PCP - General Minna Merritts, MD (Cardiology)   HPI  Michele Schultz is a 80 y.o. female Seen in followup for pacemaker implanted for complete heart block following aortic valve replacement.  The patient has a history of coronary artery grafting and aortic valve replacement in November 2012 with a bioprostheticvalve she was found at her last visit to have paroxysms of atrial fibrillation lasting a relatively brief period.  Dr. Ramonita Lab has adjusted her medication regime by limiting many.   Past Medical History  Diagnosis Date  . Hypertension   . Anemia   . Neuralgia, neuritis, and radiculitis, unspecified   . Coronary artery disease     s/p CABG 11/12  . Aortic valve disorders     s/p AVR for severe AS 11/12  . Onychia and paronychia of toe   . Osteoarthrosis, unspecified whether generalized or localized, pelvic region and thigh     mainly in her back and knees  . Enthesopathy of hip region   . Esophageal reflux     followed by Dr.Seigal. stabilized with a combination of Nexium and Zantac  . Knee joint replacement by other means   . Stress incontinence, female     followed by Dr.Cope  . Atrioventricular block, complete   . Cardiac pacemaker -st Judes     11/12  . Chronic diastolic heart failure     ef 55%echo 11/12  . Type II or unspecified type diabetes mellitus without mention of complication, not stated as uncontrolled     pt. reports that she is borderline   . Spinal stenosis   . Neuropathy     Past Surgical History  Procedure Laterality Date  . Replacement total knee  11/2006    right knee  . Nasal sinus surgery  2009  . Total hip arthroplasty  09/2010  . Cardiac catheterization  08/2009    50% stenosis distal left main, 50% stenosis ostial left circumflex.   . Cardiac catheterization      at [facility]  . Eye surgery      IOL/ after cataracts removed- bilateral   . Aortic valve replacement  10/17/2011   Procedure: AORTIC VALVE REPLACEMENT (AVR);  Surgeon: Melrose Nakayama, MD;  Location: [place];  Service: Open Heart Surgery;  Laterality: N/A;  . Coronary artery bypass graft  10/17/2011    Procedure: CORONARY ARTERY BYPASS GRAFTING (CABG);  Surgeon: Melrose Nakayama, MD;  Location: [place];  Service: Open Heart Surgery;  Laterality: N/A;  Times Two, using right leg greater saphenous vein harvested endoscopically    Current Outpatient Prescriptions  Medication Sig Dispense Refill  . albuterol-ipratropium (COMBIVENT) 18-103 MCG/ACT inhaler Inhale 2 puffs into the lungs every 6 (six) hours as needed for wheezing.  1 Inhaler  4  . amLODipine (NORVASC) 5 MG tablet Take 1 tablet (5 mg total) by mouth daily.  30 tablet  6  . aspirin 81 MG tablet Take 81 mg by mouth daily.        Marland Kitchen azelastine (ASTELIN) 137 MCG/SPRAY nasal spray Place 1 spray into the nose 2 (two) times daily. Use in each nostril as directed      . benzonatate (TESSALON) 100 MG capsule Take 200 mg by mouth 3 (three) times daily as needed.      . Cyanocobalamin (VITAMIN B-12) 1000 MCG/15ML LIQD Inject 1,000 mcg as directed every 30 (thirty) days.        Marland Kitchen  cycloSPORINE (RESTASIS) 0.05 % ophthalmic emulsion Place 1 drop into both eyes 2 (two) times daily.       . furosemide (LASIX) 40 MG tablet Take 40 mg by mouth daily as needed.      . gabapentin (NEURONTIN) 100 MG capsule Take 200 mg by mouth at bedtime.      Marland Kitchen HYDROcodone-acetaminophen (NORCO) 5-325 MG per tablet Take 2 tablets by mouth every 4 (four) hours as needed.      Marland Kitchen losartan (COZAAR) 25 MG tablet Take 1 tablet (25 mg total) by mouth daily.  25 tablet  90  . magnesium hydroxide (MILK OF MAGNESIA) 400 MG/5ML suspension Take by mouth daily as needed.      . metoprolol succinate (TOPROL-XL) 50 MG 24 hr tablet TAKE 1 TABLET DAILY (8AM)  90 tablet  3  . nitroGLYCERIN (NITROSTAT) 0.4 MG SL tablet Place 1 tablet (0.4 mg total) under the tongue every 5 (five) minutes as needed for  chest pain.  90 tablet  3  . pantoprazole (PROTONIX) 40 MG tablet Take 40 mg by mouth 2 (two) times daily.        . potassium chloride (K-DUR) 10 MEQ tablet Take 1 tablet (10 mEq total) by mouth daily.  90 tablet  3   No current facility-administered medications for this visit.    Allergies  Allergen Reactions  . Citalopram     Altered mental status  . Cymbalta [Duloxetine Hcl]     Caused sedation  . Imipramine   . Proton Pump Inhibitors     Review of Systems negative except from HPI and PMH  Physical Exam BP 114/75  Pulse 79  Ht 5\' 4"  (1.626 m)  Wt 222 lb 4 oz (100.812 kg)  BMI 38.13 kg/m2 Well developed and well nourished in no acute distress HENT normal E scleral and icterus clear Neck Supple JVP flat; carotids brisk and full Clear to ausculation  Regular rate and rhythm, 2/6 murmur with S4 Soft with active bowel sounds No clubbing cyanosis no Edema Alert and oriented, grossly normal motor and sensory function Skin Warm and Dry    Assessment and  Plan And

## 2013-12-09 NOTE — Assessment & Plan Note (Signed)
Stable

## 2013-12-09 NOTE — Assessment & Plan Note (Signed)
Of detected on her monitor. All episodes are less than 1 minute. Don't think that this qualifies justify anticoagulation

## 2013-12-09 NOTE — Assessment & Plan Note (Signed)
The patient's device was interrogated.  The information was reviewed. No changes were made in the programming.    

## 2014-01-09 ENCOUNTER — Encounter: Payer: Self-pay | Admitting: Internal Medicine

## 2014-01-22 ENCOUNTER — Emergency Department: Payer: Self-pay | Admitting: Internal Medicine

## 2014-01-22 LAB — COMPREHENSIVE METABOLIC PANEL
ALK PHOS: 95 U/L
Albumin: 3.6 g/dL (ref 3.4–5.0)
Anion Gap: 6 — ABNORMAL LOW (ref 7–16)
BILIRUBIN TOTAL: 0.2 mg/dL (ref 0.2–1.0)
BUN: 21 mg/dL — ABNORMAL HIGH (ref 7–18)
CALCIUM: 9.4 mg/dL (ref 8.5–10.1)
CHLORIDE: 104 mmol/L (ref 98–107)
Co2: 28 mmol/L (ref 21–32)
Creatinine: 1.28 mg/dL (ref 0.60–1.30)
EGFR (African American): 46 — ABNORMAL LOW
EGFR (Non-African Amer.): 39 — ABNORMAL LOW
Glucose: 101 mg/dL — ABNORMAL HIGH (ref 65–99)
Osmolality: 279 (ref 275–301)
Potassium: 3.9 mmol/L (ref 3.5–5.1)
SGOT(AST): 23 U/L (ref 15–37)
SGPT (ALT): 23 U/L (ref 12–78)
SODIUM: 138 mmol/L (ref 136–145)
Total Protein: 7.9 g/dL (ref 6.4–8.2)

## 2014-01-22 LAB — CBC
HCT: 35.7 % (ref 35.0–47.0)
HGB: 11.9 g/dL — ABNORMAL LOW (ref 12.0–16.0)
MCH: 27.9 pg (ref 26.0–34.0)
MCHC: 33.2 g/dL (ref 32.0–36.0)
MCV: 84 fL (ref 80–100)
Platelet: 260 10*3/uL (ref 150–440)
RBC: 4.24 10*6/uL (ref 3.80–5.20)
RDW: 16.3 % — ABNORMAL HIGH (ref 11.5–14.5)
WBC: 7.8 10*3/uL (ref 3.6–11.0)

## 2014-01-22 LAB — LIPASE, BLOOD: Lipase: 258 U/L (ref 73–393)

## 2014-01-22 LAB — URINALYSIS, COMPLETE
Bilirubin,UR: NEGATIVE
Blood: NEGATIVE
Glucose,UR: NEGATIVE mg/dL (ref 0–75)
KETONE: NEGATIVE
Nitrite: NEGATIVE
PH: 5 (ref 4.5–8.0)
PROTEIN: NEGATIVE
Specific Gravity: 1.014 (ref 1.003–1.030)
Squamous Epithelial: 7
WBC UR: 9 /HPF (ref 0–5)

## 2014-02-03 ENCOUNTER — Encounter: Payer: Self-pay | Admitting: Physical Medicine and Rehabilitation

## 2014-02-25 ENCOUNTER — Ambulatory Visit: Payer: Self-pay | Admitting: Internal Medicine

## 2014-03-02 ENCOUNTER — Other Ambulatory Visit: Payer: Self-pay

## 2014-03-02 ENCOUNTER — Other Ambulatory Visit: Payer: Self-pay | Admitting: *Deleted

## 2014-03-02 MED ORDER — METOPROLOL SUCCINATE ER 50 MG PO TB24: ORAL_TABLET | ORAL | Status: DC

## 2014-03-02 NOTE — Telephone Encounter (Signed)
Refill metoprolol succ

## 2014-03-02 NOTE — Telephone Encounter (Signed)
Requested Prescriptions   Signed Prescriptions Disp Refills  . metoprolol succinate (TOPROL-XL) 50 MG 24 hr tablet 30 tablet 3    Sig: TAKE 1 TABLET DAILY (8AM)    Authorizing Provider: Minna Merritts    Ordering User: Britt Bottom

## 2014-03-04 ENCOUNTER — Encounter: Payer: Self-pay | Admitting: Physical Medicine and Rehabilitation

## 2014-03-12 ENCOUNTER — Ambulatory Visit (INDEPENDENT_AMBULATORY_CARE_PROVIDER_SITE_OTHER): Payer: Medicare Other | Admitting: *Deleted

## 2014-03-12 ENCOUNTER — Encounter: Payer: Self-pay | Admitting: Internal Medicine

## 2014-03-12 DIAGNOSIS — I442 Atrioventricular block, complete: Secondary | ICD-10-CM

## 2014-03-12 DIAGNOSIS — Z950 Presence of cardiac pacemaker: Secondary | ICD-10-CM

## 2014-03-12 LAB — MDC_IDC_ENUM_SESS_TYPE_REMOTE
Battery Remaining Longevity: 109 mo
Battery Voltage: 2.96 V
Brady Statistic AP VP Percent: 1 %
Brady Statistic AP VS Percent: 11 %
Brady Statistic AS VS Percent: 89 %
Brady Statistic RV Percent Paced: 1 %
Lead Channel Impedance Value: 540 Ohm
Lead Channel Pacing Threshold Amplitude: 0.75 V
Lead Channel Pacing Threshold Amplitude: 0.875 V
Lead Channel Pacing Threshold Pulse Width: 0.4 ms
Lead Channel Pacing Threshold Pulse Width: 0.4 ms
Lead Channel Sensing Intrinsic Amplitude: 12 mV
Lead Channel Setting Pacing Amplitude: 1.125
Lead Channel Setting Pacing Amplitude: 1.75 V
Lead Channel Setting Sensing Sensitivity: 4 mV
MDC IDC MSMT LEADCHNL RA IMPEDANCE VALUE: 380 Ohm
MDC IDC MSMT LEADCHNL RA SENSING INTR AMPL: 1.5 mV
MDC IDC PG SERIAL: 7284703
MDC IDC SESS DTM: 20150409064817
MDC IDC SET LEADCHNL RV PACING PULSEWIDTH: 0.4 ms
MDC IDC STAT BRADY AS VP PERCENT: 1 %
MDC IDC STAT BRADY RA PERCENT PACED: 11 %

## 2014-03-30 ENCOUNTER — Other Ambulatory Visit: Payer: Self-pay

## 2014-03-30 DIAGNOSIS — I2510 Atherosclerotic heart disease of native coronary artery without angina pectoris: Secondary | ICD-10-CM

## 2014-03-30 DIAGNOSIS — R0789 Other chest pain: Secondary | ICD-10-CM

## 2014-03-30 DIAGNOSIS — R0602 Shortness of breath: Secondary | ICD-10-CM

## 2014-03-30 MED ORDER — POTASSIUM CHLORIDE ER 10 MEQ PO TBCR: 10.0000 meq | EXTENDED_RELEASE_TABLET | Freq: Every day | ORAL | Status: DC

## 2014-04-02 ENCOUNTER — Encounter: Payer: Self-pay | Admitting: *Deleted

## 2014-04-03 ENCOUNTER — Encounter: Payer: Self-pay | Admitting: Physical Medicine and Rehabilitation

## 2014-06-01 ENCOUNTER — Other Ambulatory Visit: Payer: Self-pay | Admitting: Pain Medicine

## 2014-06-01 ENCOUNTER — Ambulatory Visit: Payer: Self-pay | Admitting: Pain Medicine

## 2014-06-01 LAB — HEPATIC FUNCTION PANEL A (ARMC)
ALK PHOS: 90 U/L
ALT: 26 U/L (ref 12–78)
Albumin: 3.8 g/dL (ref 3.4–5.0)
BILIRUBIN TOTAL: 0.3 mg/dL (ref 0.2–1.0)
Bilirubin, Direct: <0.1 mg/dL (ref 0.00–0.20)
SGOT(AST): 30 U/L (ref 15–37)
Total Protein: 7.9 g/dL (ref 6.4–8.2)

## 2014-06-01 LAB — BASIC METABOLIC PANEL
ANION GAP: 3 — AB (ref 7–16)
BUN: 14 mg/dL (ref 7–18)
CO2: 30 mmol/L (ref 21–32)
Calcium, Total: 9.2 mg/dL (ref 8.5–10.1)
Chloride: 103 mmol/L (ref 98–107)
Creatinine: 0.89 mg/dL (ref 0.60–1.30)
Glucose: 93 mg/dL (ref 65–99)
OSMOLALITY: 272 (ref 275–301)
Potassium: 3.8 mmol/L (ref 3.5–5.1)
Sodium: 136 mmol/L (ref 136–145)

## 2014-06-01 LAB — MAGNESIUM: MAGNESIUM: 2.3 mg/dL

## 2014-06-01 LAB — SEDIMENTATION RATE: Erythrocyte Sed Rate: 44 mm/hr — ABNORMAL HIGH (ref 0–30)

## 2014-06-10 ENCOUNTER — Telehealth: Payer: Self-pay

## 2014-06-10 NOTE — Telephone Encounter (Signed)
Pt called, is confused about her remote check scheduled on Monday 7/13, would like someone to call her.

## 2014-06-10 NOTE — Telephone Encounter (Signed)
Spoke with patient, Location manager is working properly and download automatically as scheduled.

## 2014-06-15 ENCOUNTER — Ambulatory Visit (INDEPENDENT_AMBULATORY_CARE_PROVIDER_SITE_OTHER): Payer: Medicare Other | Admitting: *Deleted

## 2014-06-15 ENCOUNTER — Encounter: Payer: Self-pay | Admitting: Internal Medicine

## 2014-06-15 DIAGNOSIS — I442 Atrioventricular block, complete: Secondary | ICD-10-CM | POA: Diagnosis not present

## 2014-06-15 LAB — MDC_IDC_ENUM_SESS_TYPE_REMOTE
Brady Statistic AP VS Percent: 7.4 %
Brady Statistic AS VP Percent: 1 %
Brady Statistic AS VS Percent: 92 %
Brady Statistic RA Percent Paced: 7.2 %
Implantable Pulse Generator Model: 2210
Lead Channel Impedance Value: 540 Ohm
Lead Channel Pacing Threshold Amplitude: 0.75 V
Lead Channel Pacing Threshold Pulse Width: 0.4 ms
Lead Channel Sensing Intrinsic Amplitude: 12 mV
Lead Channel Setting Pacing Amplitude: 1 V
Lead Channel Setting Pacing Amplitude: 1.625
Lead Channel Setting Pacing Pulse Width: 0.4 ms
Lead Channel Setting Sensing Sensitivity: 4 mV
MDC IDC MSMT BATTERY REMAINING LONGEVITY: 107 mo
MDC IDC MSMT BATTERY REMAINING PERCENTAGE: 80 %
MDC IDC MSMT BATTERY VOLTAGE: 2.96 V
MDC IDC MSMT LEADCHNL RA IMPEDANCE VALUE: 380 Ohm
MDC IDC MSMT LEADCHNL RA PACING THRESHOLD AMPLITUDE: 0.625 V
MDC IDC MSMT LEADCHNL RA PACING THRESHOLD PULSEWIDTH: 0.4 ms
MDC IDC MSMT LEADCHNL RA SENSING INTR AMPL: 1.5 mV
MDC IDC PG SERIAL: 7284703
MDC IDC SESS DTM: 20150713072037
MDC IDC STAT BRADY AP VP PERCENT: 1 %
MDC IDC STAT BRADY RV PERCENT PACED: 1 %

## 2014-06-16 NOTE — Progress Notes (Signed)
Remote pacemaker transmission.   

## 2014-06-17 ENCOUNTER — Encounter: Payer: Self-pay | Admitting: Cardiovascular Disease

## 2014-06-17 ENCOUNTER — Ambulatory Visit (INDEPENDENT_AMBULATORY_CARE_PROVIDER_SITE_OTHER): Payer: Medicare Other | Admitting: Cardiovascular Disease

## 2014-06-17 VITALS — BP 142/78 | HR 78 | Ht 64.0 in | Wt 237.0 lb

## 2014-06-17 DIAGNOSIS — Z951 Presence of aortocoronary bypass graft: Secondary | ICD-10-CM

## 2014-06-17 DIAGNOSIS — Z952 Presence of prosthetic heart valve: Secondary | ICD-10-CM

## 2014-06-17 DIAGNOSIS — R110 Nausea: Secondary | ICD-10-CM

## 2014-06-17 DIAGNOSIS — Z953 Presence of xenogenic heart valve: Secondary | ICD-10-CM

## 2014-06-17 DIAGNOSIS — I2510 Atherosclerotic heart disease of native coronary artery without angina pectoris: Secondary | ICD-10-CM

## 2014-06-17 DIAGNOSIS — I10 Essential (primary) hypertension: Secondary | ICD-10-CM

## 2014-06-17 DIAGNOSIS — E785 Hyperlipidemia, unspecified: Secondary | ICD-10-CM

## 2014-06-17 DIAGNOSIS — G8929 Other chronic pain: Secondary | ICD-10-CM | POA: Insufficient documentation

## 2014-06-17 DIAGNOSIS — I442 Atrioventricular block, complete: Secondary | ICD-10-CM

## 2014-06-17 DIAGNOSIS — I4891 Unspecified atrial fibrillation: Secondary | ICD-10-CM

## 2014-06-17 MED ORDER — PROMETHAZINE HCL 25 MG PO TABS: 25.0000 mg | ORAL_TABLET | Freq: Four times a day (QID) | ORAL | Status: DC | PRN

## 2014-06-17 NOTE — Patient Instructions (Signed)
You are doing well. No medication changes were made.  Please call us if you have new issues that need to be addressed before your next appt.  Your physician wants you to follow-up in: 12 months.  You will receive a reminder letter in the mail two months in advance. If you don't receive a letter, please call our office to schedule the follow-up appointment. 

## 2014-06-17 NOTE — Assessment & Plan Note (Signed)
Currently with no symptoms of angina. No further workup at this time. Continue current medication regimen. 

## 2014-06-17 NOTE — Assessment & Plan Note (Signed)
Blood pressure is well controlled on today's visit. No changes made to the medications. 

## 2014-06-17 NOTE — Assessment & Plan Note (Signed)
Reports having severe pain, difficulty sleeping secondary to right shoulder pain, diffuse body pain from fibromyalgia. He does not have any of her pain medication as she ran out and does not get any for another week. She does have followup with the chronic pain clinic but they are not providing any medication on a primary care.

## 2014-06-17 NOTE — Assessment & Plan Note (Addendum)
Long history of periodic nausea. She is requesting Phenergan to take as needed. 30 days was provided. Unable to exclude gastroparesis. If symptoms persist, could try Reglan

## 2014-06-17 NOTE — Assessment & Plan Note (Signed)
Last echocardiogram March 2014. No further workup at this time

## 2014-06-17 NOTE — Assessment & Plan Note (Addendum)
Pacemaker placed, followed by Dr. Adam Phenix

## 2014-06-17 NOTE — Progress Notes (Signed)
Patient ID: Michele Schultz, female    DOB: 11/01/1931, 80 y.o.   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 She did have postoperative anemia and difficulty mobilizing.  Following her surgery, she had  severe back pain, spinal stenosis. She  had epidural shots x2 with mild relief  on almost every visit, she reports her debilitating leg and body pain.  She did not tolerate Lyrica. On today's visit, she reports that she has fibromyalgia. She has recently been seen by the pain clinic. She ran out of her pain medication early and she was taking too much. She has difficulty sleeping secondary to severe right shoulder pain, diffuse body pain.   She has chronic knee pain and hip pain. She has had bilateral  knee and hip replacement.   Having periods of nausea and is requesting Phenergan. She has followup with Dr. Marry Guan for her shoulder. He states that surgery is not an option until pain is under better control.  Last echocardiogram March 2014 looking at her aortic valve  She also has chronic shortness of breath.  She is relatively deconditioned, no regular walking or exercise.  She has chronic  upper airway wheezing. She denies any significant chest pain concerning for angina.   EKG shows normal sinus rhythm, rate 78 beats per minute, no significant ST or T wave changes    Outpatient Encounter Prescriptions as of 06/17/2014  Medication Sig  . albuterol-ipratropium (COMBIVENT) 18-103 MCG/ACT inhaler Inhale 2 puffs into the lungs every 6 (six)  hours as needed for wheezing.  Marland Kitchen amLODipine (NORVASC) 5 MG tablet Take 1 tablet (5 mg total) by mouth daily.  Marland Kitchen aspirin 81 MG tablet Take 81 mg by mouth daily.    Marland Kitchen azelastine (ASTELIN) 137 MCG/SPRAY nasal spray Place 1 spray into the nose 2 (two) times daily. Use in each nostril as directed  . benzonatate (TESSALON) 100 MG capsule Take 200 mg by mouth 3 (three) times daily as needed.  . Cyanocobalamin (VITAMIN B-12) 1000 MCG/15ML LIQD Inject 1,000 mcg as directed every 30 (thirty) days.    . cycloSPORINE (RESTASIS) 0.05 % ophthalmic emulsion Place 1 drop into both eyes 2 (two) times daily.   . furosemide (LASIX) 40 MG tablet Take 40 mg by mouth daily as needed.  . gabapentin (NEURONTIN) 100 MG capsule Take 200 mg by mouth at bedtime.  Marland Kitchen HYDROcodone-acetaminophen (NORCO) 10-325 MG per tablet Take 1 tablet by mouth every 4 (four) hours as needed.  Marland Kitchen losartan (COZAAR) 25 MG tablet Take 1 tablet (25 mg total) by mouth daily.  . magnesium hydroxide (MILK OF MAGNESIA) 400 MG/5ML suspension Take by mouth daily as needed.  . metoprolol succinate (TOPROL-XL) 50 MG 24 hr tablet TAKE 1 TABLET DAILY (8AM)  . nitroGLYCERIN (NITROSTAT) 0.4 MG SL tablet Place 1 tablet (0.4 mg total) under the tongue every 5 (five) minutes as needed for chest pain.  . pantoprazole (PROTONIX) 40 MG tablet Take 40 mg by mouth 2 (two) times daily.    . potassium chloride (K-DUR) 10 MEQ tablet Take 1 tablet (  10 mEq total) by mouth daily.  . promethazine (PHENERGAN) 25 MG tablet Take 1 tablet (25 mg total) by mouth every 6 (six) hours as needed for nausea or vomiting.  . [DISCONTINUED] HYDROcodone-acetaminophen (NORCO) 5-325 MG per tablet Take 2 tablets by mouth every 4 (four) hours as needed.    Review of Systems  Constitutional: Negative.   HENT: Negative.   Eyes: Negative.   Respiratory: Positive for wheezing.   Gastrointestinal: Negative.   Musculoskeletal: Positive for arthralgias, back pain, gait problem and myalgias.   Skin: Negative.   Neurological: Negative.   Psychiatric/Behavioral: Negative.   All other systems reviewed and are negative.  BP 142/78  Pulse 78  Ht 5\' 4"  (1.626 m)  Wt 237 lb (107.502 kg)  BMI 40.66 kg/m2  Physical Exam  Nursing note and vitals reviewed. Constitutional: She is oriented to person, place, and time. She appears well-developed and well-nourished.  Obese  HENT:  Head: Normocephalic.  Nose: Nose normal.  Mouth/Throat: Oropharynx is clear and moist.  Eyes: Conjunctivae are normal. Pupils are equal, round, and reactive to light.  Neck: Normal range of motion. Neck supple. No JVD present.  Cardiovascular: Normal rate, regular rhythm, S1 normal, S2 normal and intact distal pulses.  Exam reveals no gallop and no friction rub.   Murmur heard.  Systolic murmur is present with a grade of 2/6  Well-healed midline incision  Pulmonary/Chest: Effort normal. No respiratory distress. She has wheezes. She has no rales. She exhibits no tenderness.  Abdominal: Soft. Bowel sounds are normal. She exhibits no distension. There is no tenderness.  Musculoskeletal: Normal range of motion. She exhibits no edema and no tenderness.  Lymphadenopathy:    She has no cervical adenopathy.  Neurological: She is alert and oriented to person, place, and time. Coordination normal.  Skin: Skin is warm and dry. No rash noted. No erythema.  Psychiatric: She has a normal mood and affect. Her behavior is normal. Judgment and thought content normal.    Assessment and Plan

## 2014-06-17 NOTE — Assessment & Plan Note (Signed)
Currently not on a statin likely secondary to diffuse body pain.

## 2014-07-08 ENCOUNTER — Encounter: Payer: Self-pay | Admitting: Cardiology

## 2014-08-11 ENCOUNTER — Encounter: Payer: Self-pay | Admitting: Internal Medicine

## 2014-08-25 ENCOUNTER — Ambulatory Visit: Payer: Self-pay | Admitting: Internal Medicine

## 2014-08-26 ENCOUNTER — Other Ambulatory Visit: Payer: Self-pay | Admitting: Pain Medicine

## 2014-08-26 ENCOUNTER — Ambulatory Visit: Payer: Self-pay | Admitting: Pain Medicine

## 2014-09-03 ENCOUNTER — Ambulatory Visit: Payer: Self-pay | Admitting: Pain Medicine

## 2014-09-14 ENCOUNTER — Ambulatory Visit: Payer: Self-pay | Admitting: Pain Medicine

## 2014-09-16 ENCOUNTER — Encounter: Payer: Self-pay | Admitting: Internal Medicine

## 2014-09-16 ENCOUNTER — Ambulatory Visit (INDEPENDENT_AMBULATORY_CARE_PROVIDER_SITE_OTHER): Payer: Medicare Other | Admitting: *Deleted

## 2014-09-16 DIAGNOSIS — I442 Atrioventricular block, complete: Secondary | ICD-10-CM

## 2014-09-16 NOTE — Progress Notes (Signed)
Remote pacemaker transmission.   

## 2014-09-20 LAB — MDC_IDC_ENUM_SESS_TYPE_REMOTE
Battery Remaining Percentage: 78 %
Brady Statistic AP VP Percent: 1 %
Brady Statistic AP VS Percent: 5.4 %
Brady Statistic AS VP Percent: 1 %
Brady Statistic AS VS Percent: 94 %
Brady Statistic RA Percent Paced: 5.2 %
Implantable Pulse Generator Serial Number: 7284703
Lead Channel Impedance Value: 390 Ohm
Lead Channel Pacing Threshold Amplitude: 0.875 V
Lead Channel Pacing Threshold Pulse Width: 0.4 ms
Lead Channel Sensing Intrinsic Amplitude: 1.7 mV
Lead Channel Setting Pacing Amplitude: 1.5 V
Lead Channel Setting Pacing Pulse Width: 0.4 ms
Lead Channel Setting Sensing Sensitivity: 4 mV
MDC IDC MSMT BATTERY REMAINING LONGEVITY: 106 mo
MDC IDC MSMT BATTERY VOLTAGE: 2.96 V
MDC IDC MSMT LEADCHNL RA PACING THRESHOLD AMPLITUDE: 0.5 V
MDC IDC MSMT LEADCHNL RA PACING THRESHOLD PULSEWIDTH: 0.4 ms
MDC IDC MSMT LEADCHNL RV IMPEDANCE VALUE: 590 Ohm
MDC IDC MSMT LEADCHNL RV SENSING INTR AMPL: 12 mV
MDC IDC SESS DTM: 20151014074948
MDC IDC SET LEADCHNL RV PACING AMPLITUDE: 1.125
MDC IDC STAT BRADY RV PERCENT PACED: 1 %

## 2014-09-22 ENCOUNTER — Ambulatory Visit: Payer: Self-pay | Admitting: Pain Medicine

## 2014-09-30 ENCOUNTER — Encounter: Payer: Self-pay | Admitting: Internal Medicine

## 2014-10-05 ENCOUNTER — Ambulatory Visit: Payer: Self-pay | Admitting: Pain Medicine

## 2014-10-07 ENCOUNTER — Encounter: Payer: Self-pay | Admitting: Cardiology

## 2014-10-13 ENCOUNTER — Ambulatory Visit: Payer: Self-pay | Admitting: Pain Medicine

## 2014-10-16 LAB — COMPREHENSIVE METABOLIC PANEL
ANION GAP: 10 (ref 7–16)
Albumin: 2.5 g/dL — ABNORMAL LOW (ref 3.4–5.0)
Alkaline Phosphatase: 113 U/L
BILIRUBIN TOTAL: 0.3 mg/dL (ref 0.2–1.0)
BUN: 14 mg/dL (ref 7–18)
CALCIUM: 8.4 mg/dL — AB (ref 8.5–10.1)
CREATININE: 0.83 mg/dL (ref 0.60–1.30)
Chloride: 103 mmol/L (ref 98–107)
Co2: 25 mmol/L (ref 21–32)
Glucose: 206 mg/dL — ABNORMAL HIGH (ref 65–99)
Osmolality: 282 (ref 275–301)
Potassium: 3.6 mmol/L (ref 3.5–5.1)
SGOT(AST): 39 U/L — ABNORMAL HIGH (ref 15–37)
SGPT (ALT): 43 U/L
SODIUM: 138 mmol/L (ref 136–145)
Total Protein: 7.2 g/dL (ref 6.4–8.2)

## 2014-10-16 LAB — CBC WITH DIFFERENTIAL/PLATELET
BASOS ABS: 0 10*3/uL (ref 0.0–0.1)
Basophil %: 0.6 %
Eosinophil #: 0.1 10*3/uL (ref 0.0–0.7)
Eosinophil %: 1 %
HCT: 35.5 % (ref 35.0–47.0)
HGB: 11.4 g/dL — ABNORMAL LOW (ref 12.0–16.0)
LYMPHS ABS: 1.2 10*3/uL (ref 1.0–3.6)
Lymphocyte %: 18 %
MCH: 28 pg (ref 26.0–34.0)
MCHC: 32 g/dL (ref 32.0–36.0)
MCV: 88 fL (ref 80–100)
MONO ABS: 0.6 x10 3/mm (ref 0.2–0.9)
Monocyte %: 8.7 %
NEUTROS PCT: 71.7 %
Neutrophil #: 4.9 10*3/uL (ref 1.4–6.5)
Platelet: 271 10*3/uL (ref 150–440)
RBC: 4.06 10*6/uL (ref 3.80–5.20)
RDW: 17.5 % — AB (ref 11.5–14.5)
WBC: 6.8 10*3/uL (ref 3.6–11.0)

## 2014-10-16 LAB — URINALYSIS, COMPLETE
Bacteria: NONE SEEN
Bilirubin,UR: NEGATIVE
Glucose,UR: 50 mg/dL (ref 0–75)
Ketone: NEGATIVE
LEUKOCYTE ESTERASE: NEGATIVE
NITRITE: NEGATIVE
PH: 6 (ref 4.5–8.0)
Protein: NEGATIVE
RBC,UR: 4 /HPF (ref 0–5)
SPECIFIC GRAVITY: 1.02 (ref 1.003–1.030)
Squamous Epithelial: <1
WBC UR: 3 /HPF (ref 0–5)

## 2014-10-16 LAB — TROPONIN I

## 2014-10-16 LAB — LIPASE, BLOOD: LIPASE: 142 U/L (ref 73–393)

## 2014-10-17 ENCOUNTER — Ambulatory Visit: Payer: Self-pay | Admitting: Orthopedic Surgery

## 2014-10-17 LAB — SEDIMENTATION RATE: Erythrocyte Sed Rate: 110 mm/hr — ABNORMAL HIGH (ref 0–30)

## 2014-10-17 LAB — TROPONIN I

## 2014-10-18 LAB — TSH: Thyroid Stimulating Horm: 1.5 u[IU]/mL

## 2014-10-19 LAB — PLATELET COUNT: Platelet: 294 10*3/uL (ref 150–440)

## 2014-10-20 ENCOUNTER — Encounter: Payer: Self-pay | Admitting: Internal Medicine

## 2014-10-20 LAB — CBC WITH DIFFERENTIAL/PLATELET
BANDS NEUTROPHIL: 10 %
Comment - H1-Com1: NORMAL
Eosinophil: 1 %
HCT: 31.7 % — AB (ref 35.0–47.0)
HGB: 10.3 g/dL — ABNORMAL LOW (ref 12.0–16.0)
LYMPHS PCT: 12 %
MCH: 28.1 pg (ref 26.0–34.0)
MCHC: 32.4 g/dL (ref 32.0–36.0)
MCV: 87 fL (ref 80–100)
Metamyelocyte: 1 %
Monocytes: 9 %
PLATELETS: 303 10*3/uL (ref 150–440)
RBC: 3.66 10*6/uL — ABNORMAL LOW (ref 3.80–5.20)
RDW: 17.3 % — ABNORMAL HIGH (ref 11.5–14.5)
Segmented Neutrophils: 67 %
WBC: 6.1 10*3/uL (ref 3.6–11.0)

## 2014-10-20 LAB — COMPREHENSIVE METABOLIC PANEL
ALBUMIN: 2.2 g/dL — AB (ref 3.4–5.0)
ALK PHOS: 83 U/L
AST: 23 U/L (ref 15–37)
Anion Gap: 8 (ref 7–16)
BUN: 15 mg/dL (ref 7–18)
Bilirubin,Total: 0.3 mg/dL (ref 0.2–1.0)
CREATININE: 0.69 mg/dL (ref 0.60–1.30)
Calcium, Total: 8.4 mg/dL — ABNORMAL LOW (ref 8.5–10.1)
Chloride: 106 mmol/L (ref 98–107)
Co2: 29 mmol/L (ref 21–32)
Glucose: 112 mg/dL — ABNORMAL HIGH (ref 65–99)
Osmolality: 287 (ref 275–301)
Potassium: 3.8 mmol/L (ref 3.5–5.1)
SGPT (ALT): 29 U/L
Sodium: 143 mmol/L (ref 136–145)
Total Protein: 6.4 g/dL (ref 6.4–8.2)

## 2014-10-20 LAB — IRON AND TIBC
Iron Bind.Cap.(Total): 254 ug/dL (ref 250–450)
Iron Saturation: 14 %
Iron: 36 ug/dL — ABNORMAL LOW (ref 50–170)
Unbound Iron-Bind.Cap.: 218 ug/dL

## 2014-10-20 LAB — RETICULOCYTES
Absolute Retic Count: 0.079 10*6/uL (ref 0.019–0.186)
RETICULOCYTE: 2.2 % (ref 0.4–3.1)

## 2014-10-20 LAB — CK: CK, Total: 29 U/L

## 2014-10-20 LAB — FERRITIN: Ferritin (ARMC): 50 ng/mL (ref 8–388)

## 2014-10-21 LAB — CBC WITH DIFFERENTIAL/PLATELET
Eosinophil: 1 %
HCT: 33.2 % — ABNORMAL LOW (ref 35.0–47.0)
HGB: 10.8 g/dL — ABNORMAL LOW (ref 12.0–16.0)
Lymphocytes: 24 %
MCH: 28.2 pg (ref 26.0–34.0)
MCHC: 32.6 g/dL (ref 32.0–36.0)
MCV: 87 fL (ref 80–100)
MONOS PCT: 7 %
Myelocyte: 2 %
NRBC/100 WBC: 1 /
PLATELETS: 350 10*3/uL (ref 150–440)
RBC: 3.84 10*6/uL (ref 3.80–5.20)
RDW: 17.5 % — AB (ref 11.5–14.5)
SEGMENTED NEUTROPHILS: 66 %
WBC: 7.2 10*3/uL (ref 3.6–11.0)

## 2014-10-22 LAB — PES,RFLX IFE+FREE K/L LT

## 2014-10-23 ENCOUNTER — Inpatient Hospital Stay: Payer: Self-pay | Admitting: Internal Medicine

## 2014-10-24 LAB — FOLATE: Folic Acid: 12.3 ng/mL (ref 3.1–100.0)

## 2014-10-24 LAB — CULTURE, BLOOD (SINGLE)

## 2014-10-25 LAB — PLATELET COUNT: Platelet: 379 10*3/uL (ref 150–440)

## 2014-10-25 LAB — CLOSTRIDIUM DIFFICILE(ARMC)

## 2014-10-27 LAB — UR PROT ELECTROPHORESIS, URINE RANDOM

## 2014-11-12 ENCOUNTER — Encounter (HOSPITAL_COMMUNITY): Payer: Self-pay | Admitting: Internal Medicine

## 2014-12-04 ENCOUNTER — Emergency Department: Payer: Self-pay | Admitting: Emergency Medicine

## 2014-12-04 LAB — COMPREHENSIVE METABOLIC PANEL
ALBUMIN: 3.5 g/dL (ref 3.4–5.0)
ANION GAP: 7 (ref 7–16)
AST: 26 U/L (ref 15–37)
Alkaline Phosphatase: 92 U/L
BUN: 8 mg/dL (ref 7–18)
Bilirubin,Total: 0.8 mg/dL (ref 0.2–1.0)
CHLORIDE: 104 mmol/L (ref 98–107)
CO2: 29 mmol/L (ref 21–32)
Calcium, Total: 9.5 mg/dL (ref 8.5–10.1)
Creatinine: 0.82 mg/dL (ref 0.60–1.30)
EGFR (Non-African Amer.): >60
Glucose: 104 mg/dL — ABNORMAL HIGH (ref 65–99)
OSMOLALITY: 278 (ref 275–301)
POTASSIUM: 3.3 mmol/L — AB (ref 3.5–5.1)
SGPT (ALT): 18 U/L
Sodium: 140 mmol/L (ref 136–145)
Total Protein: 7.9 g/dL (ref 6.4–8.2)

## 2014-12-04 LAB — CBC
HCT: 38 % (ref 35.0–47.0)
HGB: 11.9 g/dL — AB (ref 12.0–16.0)
MCH: 27.4 pg (ref 26.0–34.0)
MCHC: 31.3 g/dL — AB (ref 32.0–36.0)
MCV: 88 fL (ref 80–100)
Platelet: 278 10*3/uL (ref 150–440)
RBC: 4.34 10*6/uL (ref 3.80–5.20)
RDW: 17.2 % — AB (ref 11.5–14.5)
WBC: 9.8 10*3/uL (ref 3.6–11.0)

## 2014-12-04 LAB — URINALYSIS, COMPLETE
Bacteria: NONE SEEN
Bilirubin,UR: NEGATIVE
Blood: NEGATIVE
Glucose,UR: NEGATIVE mg/dL (ref 0–75)
KETONE: NEGATIVE
NITRITE: NEGATIVE
Ph: 7 (ref 4.5–8.0)
Protein: NEGATIVE
Specific Gravity: 1.006 (ref 1.003–1.030)
Squamous Epithelial: 1

## 2014-12-04 LAB — LIPASE, BLOOD: Lipase: 155 U/L (ref 73–393)

## 2014-12-06 LAB — URINE CULTURE

## 2015-03-26 NOTE — Consult Note (Signed)
PATIENT NAME:  Michele Schultz, COMPERE MR#:  [id number] DATE OF BIRTH:  Jan 20, 1931  REFERRING PHYSICIAN:   Dr. Ramonita Lab.   PRIMARY CARE PHYSICIAN:  Dr. Ramonita Lab.  ATTENDING GASTROENTEROLOGIST:  Dr. Lucilla Lame.  CONSULTING PHYSICIAN:  Andria Meuse, NP  REASON FOR CONSULTATION:  Intractable nausea and vomiting.   HISTORY OF PRESENT ILLNESS:  Michele Schultz is a 81 year old female who was diagnosed with a urinary tract infection approximately 2 weeks ago. She was having fevers around 102. She was found to have Proteus and Klebsiella resistant to penicillin and cephalosporins. She was started on oral Levaquin; however, she was unable to keep an antibiotic down due to nausea and vomiting. She states that she is vomiting once or twice per day. If she is not vomiting, she has dry heaves. There is no particular time of day that she vomits. Her symptoms are worse after eating. She denies any abdominal pain. She has had some lower back pain. She does have occasional heartburn a couple of times per week and previously took over-the-counter Zantac for this. She denies any dysphagia or odynophagia. Denies any rectal bleeding or melena. She has been taking milk of magnesia for years for chronic constipation. She denies any history of thyroid disorder. She does have vertigo and dizziness since she has been sick. She notes that nothing makes the nausea better. Her weight is relatively stable over the last year. She denies any NSAIDs.   Previous GI workup for iron deficiency anemia included an EGD July 04, 2012 by Dr. Candace Cruise where she was found to have an D2 polyp and biopsy of polyp was a lipoma. Gastric biopsy showed reactive gastropathy and mild chronic nonspecific gastritis. There was no evidence of H. pylori. She had an another EGD on 08/17/2009 by Dr. Vira Agar. She was found to have a small hiatal hernia, erosive gastropathy, and a third EGD, September 09, 2008 by Dr. Vira Agar, which showed a small hiatal hernia and a  small duodenal angiectasia. She has had 2 colonoscopies, one by Dr. Vira Agar on 08/17/2009, which showed an erythematous sigmoid colon with a few erosions and sigmoid diverticulosis with internal hemorrhoids. In August 2008, she had a colonoscopy by  Dr. Sonny Masters which showed 2 sigmoid colon polyps and diverticulosis of the sigmoid colon.   PAST MEDICAL, SURGICAL HISTORY: Pyelonephritis, hypertension, urinary stress incontinence, B12 deficiency, GERD, diabetes mellitus, coronary artery disease, status post CABG in 2012. She has osteoarthritis, hyperlipidemia, iron deficiency anemia, history of valvular heart disease, status post bovine aortic valve replacement in November 2012, history of right knee replacement, sinus surgery and hip replacement in October 2011.   MEDICATIONS:  Prior to admission, B12 of 1000 mcg IM monthly, pantoprazole 40 mg b.i.d., aspirin 81 mg daily, Restasis 1 drop b.i.d., Astelin 1 spray twice a day p.r.n., milk of magnesia at bedtime, Norco 10/325 mg 1 p.o. q.6 hours p.r.n. severe pain, potassium chloride 20 mEq daily, Ditropan XL 5 mg daily, Norvasc 5 mg daily, ibuprofen  800 mg t.i.d., p.r.n. pain (denies recent use), Lipitor 40 mg at bedtime, hydralazine 50 mg b.i.d. and Levaquin.   ALLERGIES: METFORMIN (NAUSEA), CITALOPRAM (ALTERED MENTAL STATUS), CYMBALTA (SEDATION), IMIPRAMINE (HIVES).   FAMILY HISTORY:  There is no known family history carcinoma, liver or chronic GI problems.   SOCIAL HISTORY:  She lives alone. She is married. Her husband is in a nursing home. She denies any tobacco, alcohol or drug use. 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   REVIEW OF SYSTEMS:  See HPI.  GENITOURINARY:  She did have increased urinary frequency and hematuria.  CONSTITUTIONAL:  She did have fever and chills.  MUSCULOSKELETAL:  She has had chronic back pain for the last several weeks. She has history of joint pain and osteoarthritis. Otherwise,  negative complete review of systems.   PHYSICAL EXAMINATION:  VITAL SIGNS:  Temp 98.3, pulse 75, respirations 18, blood pressure 118/73, pulse ox 98% on room air.  GENERAL:  Obese, pleasant and cooperative black female in no acute distress.  HEENT: Sclerae clear. Anicteric conjunctivae, pink. Oropharynx: She does have a white coat to her tongue.    NECK:  Supple without any mass or thyromegaly.  CHEST: Heart: Regular rate and rhythm, 2/6 murmur noted.  LUNGS:  Clear to auscultation bilaterally.  ABDOMEN:  Protuberant with positive bowel sounds x 4. No bruits auscultated. Soft, nontender, nondistended without hepatosplenomegaly or mass. Exam limited given the patient's body habitus.  EXTREMITIES:  Without edema or clubbing.   LABORATORY  STUDIES:  Hemoglobin 11.5, down to 9.6 today. BMP and magnesium normal. LFTs normal. TSH normal.   IMAGING: She had a negative three-way abdomen.   IMPRESSION:  [name] is a pleasant 81 year old female admitted with pyelonephritis and persistent nausea and vomiting on Levaquin. She does have oral candidiasis, which may be contributing to her symptoms. Cause is likely that her nausea and vomiting is an antibiotic-induced side effect or due to pyelonephritis. Cannot r/o gastroparesis.  I suspect nausea and vomiting will resolve after her antibiotics are discontinued. It is possible she could have developed gastritis or peptic ulcer disease; however, that would have treated with proton pump inhibitor. No acute indication for esophagogastroduodenoscopy at this time. I have reviewed her previous endoscopies from 2013, 2010 and 2009.   PLAN: 1.  Agree with PPI b.i.d. for now.  2.  Nystatin 5 mL q.i.d. swish and swallow.  3.  Agree with antiemetics and supportive measures.  4.  We will consider changing antibiotic therapy if she will need long-term antibiotics, although options are limited given urine sensitivity.   I have discussed the above patient with Dr.  Allen Norris, and he is in agreement with this plan.  ____________________________ Andria Meuse, NP [id number] D: 04/15/2013 12:28:00 ET T: 04/15/2013 12:52:10 ET JOB#: [id number]  cc: Andria Meuse, NP, <Dictator> Adin Hector, MD [facility] SIGNED 04/18/2013 11:40

## 2015-03-26 NOTE — Discharge Summary (Signed)
PATIENT NAME:  Michele Schultz, Michele Schultz MR#:  [id number] DATE OF BIRTH:  05/31/1931  DATE OF ADMISSION:  04/14/2013 DATE OF DISCHARGE:  04/18/2013  FINAL DIAGNOSES:  1. Pyelonephritis with Klebsiella and Proteus.  2. Dehydration secondary to #1.  3. Diabetic gastroparesis, contributing to her nausea and vomiting.  4. Adult-onset diabetes mellitus, now diet controlled.  5. Coronary artery disease.  6. Chronic dyspepsia.  7. B12 deficiency.  8. Spinal stenosis.  9. Coronary artery disease, prior bypass surgery.  10. History of valvular heart disease, status post bovine aortic valve replacement.   HISTORY AND PHYSICAL: Please see dictated admission history and physical.   SUMMARY OF COURSE: The patient was admitted with refractory nausea, vomiting, fevers at home. She had been on Levaquin after she was found to have Proteus and Klebsiella in the urine with Klebsiella resistant to multiple antibiotics, and Levaquin really being the one common treatment option. It was unclear whether the Levaquin was causing issue, and so she was placed on this IV, placed on IV resuscitation, initially improved. She was switched over to oral medicines, however developed worsening vomiting.   She underwent ultrasound which revealed cholelithiasis, but no evidence that the gallstones were causing issue. GI saw the patient and the patient underwent gastric imaging study which revealed evidence of gastroparesis, most likely related to her previous diabetes.   She was started on Reglan. Her diet was advanced. She appeared to tolerate this. She continued to have some nausea, but has had nausea longer lasting and appeared to be close to baseline with this. She felt ready to go home, though complained of some weakness. Physical Therapy did ambulate the patient and she did well, and it was thought that she might benefit from outpatient physical therapy to further improve her strength and improve balance. At this time, she will be  discharged home in stable condition with physical activity up as tolerated with a walker. She should check her blood sugar daily and record this. She is advised that she should be eating 4 to 5 small meals a day, low fat and Dietary saw the patient during this hospitalization to further adjust. She will return to our office in the next 1 to 2 weeks. She is having worsening back pain, had been using ibuprofen but needs to avoid this; therefore, we will get her over to Physiatry in the next couple of weeks as well. We can arrange followup with Gastroenterology based on her progress of the above and at her follow-up appointment.   DISCHARGE MEDICATIONS:  1. Aspirin 81 mg p.o. daily.  2. Pantoprazole 40 mg p.o. b.i.d.  3. Vitamin B12 1000 mcg IM q. month.  4. Astelin one spray each nostril b.i.d.  5. Atorvastatin 40 mg p.o. at bedtime.  6. Restasis one drop to each eye every 12 hours.  7. Milk of magnesia 30 mL p.o. at bedtime.  8. Norco 10/325, one p.o. q.6 hours as needed for severe pain.  9. Potassium 20 mEq p.o. daily. 10. Reglan 5 mg p.o. t.i.d. before meals.  11. Ondansetron 4 mg p.o. q.8 hours as needed for nausea or vomiting.  12. Nystatin 5 mL p.o. q.6 hours for five days for oral candidiasis.  13. Losartan 50 mg p.o. daily.   The patient was given instructions to stop amlodipine, Ditropan, hydralazine, Levaquin and ibuprofen. Previously question of whether losartan had caused nausea, vomiting; however, the patient's continued symptoms off this medication suggest that this is not the source.  ____________________________ Adin Hector, MD bjk:es D: 04/18/2013 08:11:44 ET T: 04/18/2013 08:47:41 ET JOB#: [id number]  cc: Adin Hector, MD, <Dictator> Ramonita Lab MD ELECTRONICALLY SIGNED 04/22/2013 12:17

## 2015-03-26 NOTE — H&P (Signed)
PATIENT NAME:  Michele Schultz, Michele Schultz MR#:  [id number] DATE OF BIRTH:  10/13/1931  DATE OF ADMISSION:  04/14/2013  CHIEF COMPLAINT: Nausea, vomiting, fevers.   HISTORY OF PRESENT ILLNESS: A 81 year old female with a history of high blood pressure, stress incontinence, B12 deficiency, dyspepsia, diabetes, coronary artery disease who was seen in the office last week with evidence of a urinary tract infection, fevers over 101. Urine culture was obtained which revealed both Proteus and Klebsiella, with Klebsiella resistant to penicillin, cephalosporins. Both of these susceptible to Cipro and Levaquin. She was placed on oral Levaquin, however over the weekend has developed worsening nausea, vomiting, and a feeling of urinary retention. She has not even been able to keep the antibiotics down. She feels like she is really not able to tolerate fluids or food. She was evaluated in GI for routine followup, where she was found to have a persistent fever up to 100.4. She is admitted now with nausea, vomiting, probable pyelonephritis, with failing outpatient treatment.   PAST MEDICAL HISTORY: 1.  Hypertension.  2.  B12 deficiency.  3.  Stress incontinence.  4.  Gastroesophageal reflux disease, with chronic nausea.  5.  Osteoarthritis.  6.  Hyperlipidemia.  7.  Adult-onset diabetes mellitus, diet-controlled.  8.  Coronary artery disease, with prior bypass surgery.  9.  Iron deficiency anemia.  10.  History of valvular heart disease, status post bovine aortic valve replacement, November 2012.  11. History of right knee replacement.  12.  Sinus surgery.  13.  Hip replacement, October 2011.  14. Bypass surgery, 2012 at the time of her aortic valve replacement.   ALLERGIES: METFORMIN, causes nausea. CITALOPRAM, causes altered mental status. CYMBALTA, causes sedation. IMIPRAMINE, causes hives.   She had a report of allergies to VICODIN and PROTON PUMP INHIBITORS,  but these do not appear to be true allergies.    MEDICATIONS: 1.  B12 1000 mcg IM every month.  2.  Pantoprazole 40 mg p.o. b.i.d.  3.  Aspirin 81 mg p.o. daily.  4.  Restasis, 1 drop both eyes b.i.d.  5.  Astelin 1 spray each nostril twice a day, as needed.  6.  Milk of magnesia, 2 tablespoons daily.  7.  Norco 10/325, 1 p.o. q.6 h. p.r.n., severe pain.  8.  Potassium 20 mEq p.o. daily.  9.  Ditropan XL 5 mg p.o. daily.  10.  Norvasc 5 mg p.o. daily.  11.  Ibuprofen 800 mg p.o. t.i.d. p.r.n., pain.  12.  Lipitor 40 mg p.o. at bedtime.  13.  Hydralazine 50 mg p.o. b.i.d.  14.  Levaquin, recently begun.   SOCIAL HISTORY: No tobacco or alcohol.   FAMILY HISTORY: Diabetes.   REVIEW OF SYSTEMS: Please see HPI. Fevers, chills as noted above. Some vomiting. No back pain, but some generalized abdominal discomfort. Chronic constipation, unchanged.   Remainder of complete review of systems is negative.   PHYSICAL EXAMINATION: VITAL SIGNS: Weight is 228, blood pressure 133/83, temperature 100.4, pulse 69.  GENERAL: Alert, well-developed, well-nourished female, appears ill.  EYES: Pupils equal, round and reactive to light. Lids and conjunctivae unremarkable.  EARS, NOSE, AND THROAT: Examination unremarkable.  OROPHARYNX: Dry, without lesions.  NECK: Supple. Trachea midline. No thyromegaly.  HEART: Regular rate and rhythm, without gallops, rubs; 2/6 systolic murmur.  LUNGS: Clear bilaterally, without wheeze or retraction.  ABDOMEN: Soft and slightly distended. Decreased bowel sounds without guard, rebound.   SKIN: No significant rashes or nodules.  LYMPH NODES: No cervical or supraclavicular nodes.  MUSCULOSKELETAL: No clubbing, cyanosis. No foot lesions. Minimal edema.  NEUROLOGIC: Cranial nerves are intact. Antalgic gait.   IMPRESSION AND PLAN: 1.  Pyelonephritis, multidrug-resistant Klebsiella, failed outpatient management: Will place her on IV fluids. Will try IV Levaquin. Given the resistance pattern we are somewhat limited as  to what we can try. Hopefully we will see that to once we get the infection treated that the vomiting is really just related to the urinary tract infection rather than the medication itself. Zofran as needed. Clear liquid diet. Do a 3-way abdomen. Will scan the bladder. Hold the Ditropan to make sure she has not had urinary retention.  2.  Chronic pain: Will try to hold off on pain medications unless absolutely necessary.  3.  Chronic constipation: Continue milk of magnesia as needed.  4.  Dyspepsia: Continue on pantoprazole; he is changing this to IV given the above symptoms.  5. Hypertension: She wonders whether hydralazine is now causing vomiting, although she has been on it before without issue. Will increase the Norvasc, hold hydralazine and follow her blood pressure.  6.  Coronary artery disease: Continue Lipitor unless liver enzymes are markedly elevated, and will hold aspirin until we see if the stomach improves.     ____________________________ Adin Hector, MD bjk:dm D: 04/14/2013 14:02:03 ET T: 04/14/2013 14:15:38 ET JOB#: [id number]  cc: Tama High III, MD, <Dictator> Ramonita Lab MD ELECTRONICALLY SIGNED 04/15/2013 8:01

## 2015-03-26 NOTE — Consult Note (Signed)
Brief Consult Note: Diagnosis: persistent nausea & vomiting.   Patient was seen by consultant.   Consult note dictated.   Discussed with Attending MD.   Comments: Michele Schultz is a pleasant 81 y/o female admitted with pyelonephritis & persistent nausea & vomiting on Levaquin.  She does have oral candidiasis which may be contributing to her symptoms, but it is likely that nausea & vomiting is an antibiotic-induced side effect or due to pyelonephritis.  I suspect nausea & vomiting will resolve after antibiotics are discontinued.  It is possible she could have developed gastritis or PUD, however that would be treated with PPI.  No acute indication for EGD at this time.  Reviewed past endoscopies from 2013, 2010 & 2009.    Plan:  1) Agree with PPI BID for now 2) Nystatin swish & swallow QID  3) agree w/ antiemtics & supportive measures 4) Consider change in antibiotic therapy if she will need long term antibiotics although options are limited given sensitivity.  I have discussed the above patient with DR [facility] and he is in agreement with this plan. #[id number].  Electronic Signatures: Andria Meuse (NP)  (Signed 13-May-14 12:30)  Authored: Brief Consult Note   Last Updated: 13-May-14 12:30 by Andria Meuse (NP)

## 2015-03-26 NOTE — Consult Note (Signed)
Chief Complaint:  Subjective/Chief Complaint Pt continues to c/o nausea.  No emesis.  Denies abdominal pain.  Abnl GES yesterday.  + cholelithiasis on otherwise normal abd [place].   VITAL SIGNS/ANCILLARY NOTES: **Vital Signs.:   15-May-14 05:50  Vital Signs Type Routine  Temperature Temperature (F) 98.6  Celsius 37  Temperature Source oral  Pulse Pulse 74  Respirations Respirations 18  Systolic BP Systolic BP 998  Diastolic BP (mmHg) Diastolic BP (mmHg) 71  Mean BP 84  Pulse Ox % Pulse Ox % 95  Pulse Ox Activity Level  At rest  Oxygen Delivery Room Air/ 21 %   Brief Assessment:  Cardiac Regular  trace pretibial LEE bilat   Respiratory normal resp effort   Gastrointestinal Normal   Gastrointestinal details normal Soft  Nontender  Nondistended  Bowel sounds normal  No rebound tenderness  No gaurding   Additional Physical Exam GEN: A/Ox3. NAD. Skin: W/D, intact   Lab Results: Hepatic:  14-May-14 04:06   Bilirubin, Total 0.5  Bilirubin, Direct < 0.1 (Result(s) reported on 16 Apr 2013 at 08:13AM.)  Alkaline Phosphatase 77  SGPT (ALT) 23  SGOT (AST) 22  Total Protein, Serum 6.6  Albumin, Serum  3.2  Routine Chem:  13-May-14 04:23   Potassium, Serum  3.1  Magnesium, Serum 2.1 (1.8-2.4 THERAPEUTIC RANGE: 4-7 mg/dL TOXIC: > 10 mg/dL  -----------------------)  Glucose, Serum  101  BUN 9  Creatinine (comp) 0.65  Sodium, Serum 141  Chloride, Serum 105  CO2, Serum 29  Calcium (Total), Serum 8.7  Anion Gap 7  Osmolality (calc) 280  eGFR (African American) >60  eGFR (Non-African American) >60 (eGFR values <45m/min/1.73 m2 may be an indication of chronic kidney disease (CKD). Calculated eGFR is useful in patients with stable renal function. The eGFR calculation will not be reliable in acutely ill patients when serum creatinine is changing rapidly. It is not useful in  patients on dialysis. The eGFR calculation may not be applicable to patients at the low and high  extremes of body sizes, pregnant women, and vegetarians.)  14-May-14 04:06   Lipase 289 (Result(s) reported on 16 Apr 2013 at 08:10AM.)  Potassium, Serum 3.7 (Result(s) reported on 16 Apr 2013 at 04:39AM.)  Routine Hem:  13-May-14 04:23   Platelet Count (CBC) 244  Hematocrit (CBC)  29.3  WBC (CBC) 6.4  RBC (CBC)  3.57  Hemoglobin (CBC)  9.6  MCV 82  MCH 27.0  MCHC 32.9  RDW  15.4  Neutrophil % 57.6  Lymphocyte % 30.3  Monocyte % 9.0  Eosinophil % 2.4  Basophil % 0.7  Neutrophil # 3.7  Lymphocyte # 2.0  Monocyte # 0.6  Eosinophil # 0.2  Basophil # 0.0 (Result(s) reported on 15 Apr 2013 at 05:17AM.)  14-May-14 04:06   Platelet Count (CBC) 273 (Result(s) reported on 16 Apr 2013 at 04:44AM.)  Hematocrit (CBC)  32.3 (Result(s) reported on 16 Apr 2013 at 04:44AM.)   Radiology Results: [place]    14-May-14 10:21, [place]  [place]   REASON FOR EXAM:    refractory N/v  COMMENTS:       PROCEDURE: [place] - [place] GENERAL SURVEY  - Apr 16 2013 10:21AM     RESULT:     Findings: The liver demonstrates a heterogeneous echotexture. Hepatopetal   flow is identified within the portal vein. The aorta and IVC are   unremarkable.    The visualized portions of the pancreas are unremarkable. The  spleen   demonstrates a homogeneous echotexture and measures 9.13 cm in   longitudinal dimensions.  The gallbladder fossa demonstrates mobile gallstones within the   gallbladder. There is no evidence of gallbladder wall thickening,   pericholecystic fluid, sludging, sonographic Murphy's sign nor   intrahepatic or extrahepatic biliary ductal dilatation. Gallbladder wall   is 2.5 mm in thicknessand the common bile duct measures 3.8 millimeters   in diameter.    The right kidney measures 11.13 x 4.74 x 4.71 cm and the left measures   10.25 x 5.34 x 5.07 cm. There is appropriate corticomedullary   differentiation without evidence of hydronephrosis, solid or  cystic   masses nor calculi.    IMPRESSION:  Mobile gallstones within the gallbladder, otherwise   unremarkable abdominal ultrasound.  Thank you for the opportunity to contribute to the care of your patient.         Verified By: Annie Main. Burt Knack, M.D., MD  Nuclear Med:    14-May-14 15:33, Gastric Emptying Study - Nuc Med  Gastric Emptying Study - Nuc Med   REASON FOR EXAM:    refractory N/V  COMMENTS:       PROCEDURE: NM  - NM GASTRIC EMPTYING STUDY  - Apr 16 2013  3:33PM     RESULT:     Procedure: The patient received an oral meal of 4 eggs labeled with 2.138   mCi of technetium 12mlabeled sulfur colloid. The oral meal also included   2 slices of toast, jelly and 4 ounces of water. Standard scintigraphic   imaging of the abdomen was obtained. Algorithms were performed to   evaluate the amount of gastric emptying.    Findings: The patient's gastric emptying is measured at 26%. This is   below the lower limits of normal of 30%.  IMPRESSION:  Findings consistent with a component of decreased gastric   emptying.    Thank you for the opportunity to contribute to the care of your patient.         Verified By: HMikki Santee M.D., MD   Assessment/Plan:  Assessment/Plan:  Assessment Gastroparesis:  Improved on reglan 525mTID Asymptomatic cholelithiasis GERD: on PPI.   Plan 1. Continue BID PPI 2. Agree w/ reglan TID 3. Agree w/ gastroparesis diet & discussed at length w/ pt. Handout given. 4. Outpatient follow-up with GI in 2-3 weeks   Electronic Signatures: JoAndria MeuseNP)  (Signed 15-May-14 09:10)  Authored: Chief Complaint, VITAL SIGNS/ANCILLARY NOTES, Brief Assessment, Lab Results, Radiology Results, Assessment/Plan   Last Updated: 15-May-14 09:10 by JoAndria MeuseNP)

## 2015-03-27 NOTE — Consult Note (Signed)
PATIENT NAME:  Michele Schultz, Michele Schultz MR#:  [id number] DATE OF BIRTH:  [birth year]  DATE OF CONSULTATION:  10/17/2014  REFERRING PHYSICIAN:   CONSULTING PHYSICIAN:  Claud Kelp, MD  REASON FOR CONSULTATION: Right lower extremity pain.    HISTORY OF PRESENT ILLNESS: Michele Schultz is an 81 year old female who was admitted with tachycardia and right lower extremity pain. She states that her pain radiates from her hip and travels laterally down her thigh and in her posterior calf. She denies any falls or loss of consciousness and states that her pain has been progressing ever since she started a series of epidural steroid injections for her spinal stenosis.    PAST MEDICAL HISTORY:  Includes hypertension, aortic stenosis, diabetes, hyperlipidemia, and osteoporosis.   PAST SURGICAL HISTORY: Includes CABG, bilateral knee replacements, right hip replacement aortic valve replacement and pacemaker placement.   SOCIAL HISTORY: The patient lives alone. Denies alcohol or tobacco use.   MEDICATIONS: Include amlodipine, aspirin, Astelin, Bentyl, cyclosporine, furosemide, gabapentin, losartan, metoprolol, nitroglycerin, Norco, Zofran, Protonix, potassium chloride, Phenergan, vitamin B12.   ALLERGIES:  BACTRIM, CITALOPRAM, CYMBALTA, AND IMIPRAMINE.  REVIEW OF SYSTEMS: She denies any fevers or chills. She denies any blurry vision or loss of vision. She denies any cough or shortness of breath. She denies any chest pain. She does admit to chronic nausea.   PHYSICAL EXAMINATION: The patient is an alert and oriented 81 year old female in no acute distress. She complains of diffuse thigh pain, but has no pain with passive hip or knee range of motion. Her pain does seem to be somewhat reproduced with a straight leg raise test. She is nontender to palpation along her femur.   RADIOGRAPHS: X-rays taken of the right femur and the knee demonstrate stable hip and knee replacements without any evidence of osteolysis or  loosening. CT scans also were obtained which did not demonstrate any component loosening and no evidence of joint effusions or fractures seen.   ASSESSMENT: An 81 year old female with right leg pain. Given her x-ray and CT findings, as well as her painless range of motion of both her hip and knee, I suspect this is a likely result of her spinal stenosis as the source of her radiating leg pain and not her hip or knee replacement. There is also no evidence of fracture about the lower extremity. No surgical indication at this time. Recommend continued supportive treatment for her lower back pain with possible consultation with orthopedic spine or neurosurgery for further evaluation.    ____________________________ Claud Kelp, MD tte:at D: 10/17/2014 11:48:28 ET T: 10/17/2014 12:24:45 ET JOB#: [id number]  cc: Claud Kelp, MD, <Dictator> Claud Kelp MD ELECTRONICALLY SIGNED 10/17/2014 14:16

## 2015-03-27 NOTE — Consult Note (Signed)
Brief Consult Note: Patient was seen by consultant.   Consult note dictated.   Comments: 1 significant elevation esr/CRP without obvious infection or synovitis. not typical sx for PMR( no am gel, not bilateral) raises question of SBE (s/p AVR) or GCA ( some headaches but on chronic narcotics, no jaw claudication)  2. tachycardia/worsening DOE, abnl CT lung bases 3. chronic pain/OA  4. peripheral neuropathy Rec  agree with blood cultures,SPEP... consider temporal artery bx, echocardiogram.. would not favor emperic steroids.  Electronic Signatures: Leeanne Mannan., Eugene Gavia (MD)  (Signed [phone] 19:32)  Authored: Brief Consult Note   Last Updated: 17-Nov-15 19:32 by Leeanne Mannan., Eugene Gavia (MD)

## 2015-03-27 NOTE — Consult Note (Signed)
Referring Physician:  Harrie Foreman   Primary Care Physician:  [facility], [address], [place], [place] [address], [phone]  Reason for Consult: Admit Date: 20-Oct-2014  Chief Complaint: R leg pain and weakness  Reason for Consult: dizziness   History of Present Illness: History of Present Illness:   81 yo RHD F presents to [facility] secondary to R leg pain and weakness with known spinal stenosis.  Pt finally got pain under control and then reports dizziness and balance problems.  She reports to me that she has had problems walking over the past 3 weeks.  She also reports that this has not changed in the hospital while prior notes are contradictory to this.  She reports generalized fatigue but no focal changes.  She states that she has a chronic headaches but no jaw pain or claudication.  She does report some depression especially when she talks about her home in [place].  ROS:  General fatigue   HEENT no complaints   Lungs no complaints   Cardiac no complaints   GI nausea/vomiting   GU no complaints   Musculoskeletal back pain   Extremities no complaints   Skin no complaints   Neuro headache   Endocrine no complaints   Psych depression   Past Medical/Surgical Hx:  spinal stenosis:   type II diabetes:   anemia:   aortic valve stenosis:   bradycardia/complete heart block:   CAD:   Coronary Artery Disease:   Obesity:   Vitamin B12 Deficiency:   Lactose intolerance: use Lactaid  Stress Incontinence:   Osteoporosis:   Hypertension:   Hypoglycemia:   Hyperlipidemia:   Gastric Reflux:   Murmur:   Arthritis:   coronary artery bypass x 2:   sinus surg:   cataract:   THR:   valve replacement:   pacemaker:   left total knee replacement:   Right Total Knee Replacement:   Past Medical/ Surgical Hx:  Past Medical History personally reviewed by me as above   Past Surgical History personally reviewed by me as above    Home Medications: Medication Instructions Last Modified Date/Time  ondansetron 4 mg oral tablet 1 to 2 tab(s) orally every 8 hours, As Needed - for Nausea, Vomiting. Do not take while taking phenergan or any other nausea medicine. 17-Nov-15 15:02  fentaNYL 25 microgram(s) topically every 3 days 18-Nov-15 08:11  magnesium hydroxide 8% oral suspension 15 milliliter(s) orally once a day (at bedtime), As needed, constipation 18-Nov-15 08:10  nitroglycerin 0.4 mg sublingual tablet 1 tab(s) sublingual , As needed, chest pain 18-Nov-15 08:10  pregabalin 100 mg oral capsule 1 cap(s) orally 3 times a day 18-Nov-15 08:11  aspirin 81 mg oral delayed release tablet 1 tab(s) orally once a day 18-Nov-15 08:10  amLODIPine 5 mg oral tablet 1 tab(s) orally once a day 18-Nov-15 08:11  dicyclomine 10 mg oral capsule 2 cap(s) orally every 8 hours, As needed, bladder spasms 18-Nov-15 08:11  docusate sodium 100 mg oral capsule 1 cap(s) orally 2 times a day, As needed, Stool Softener 18-Nov-15 08:10  cyanocobalamin 1000 mcg oral tablet 1 tab(s) orally once a day 18-Nov-15 08:11  Norco 7.5 mg-325 mg oral tablet 1 tab(s) orally every 4 to 5 hours, maximum of 5  pills per day. 17-Nov-15 15:02  furosemide 40 mg oral tablet 1 tab(s) orally once a day, As Needed for swelling 17-Nov-15 15:02  metoprolol succinate 50 mg oral tablet, extended release 1 tab(s) orally once  a day 17-Nov-15 15:02  pantoprazole 40 mg oral delayed release tablet 1 tab(s) orally 2 times a day 17-Nov-15 15:02  potassium chloride 10 mEq oral tablet, extended release 1 tab(s) orally once a day 17-Nov-15 15:02  losartan 50 mg oral tablet 1 tab(s) orally once a day 17-Nov-15 15:02  ProAir HFA CFC free 90 mcg/inh inhalation aerosol 2 puff(s) inhaled every 6 hours, As Needed - for Shortness of Breath, for Wheezing  17-Nov-15 15:02  betamethasone-clotrimazole topical 0.05%-1% topical cream Apply topically to affected area 2 times a day, As Needed 17-Nov-15  15:02  atorvastatin 80 mg oral tablet 1 tab(s) orally once a day 17-Nov-15 15:02   Allergies:  Cymbalta: N/V  Bactrim: Hives  Citalopram: Unknown  Imipramine: Unknown  Allergies:  Allergies as above   Social/Family History: Employment Status: retired  Lives With: alone  Living Arrangements: house  Social History: no tob, no EtOH, no illicits  Family History: no strokes, no seizures   Vital Signs: **Vital Signs.:   21-Nov-15 10:05  Vital Signs Type Pre Medication  Temperature Temperature (F) 98  Celsius 36.6  Temperature Source oral  Pulse Pulse 87  Respirations Respirations 20  Systolic BP Systolic BP 097  Diastolic BP (mmHg) Diastolic BP (mmHg) 88  Mean BP 114  Pulse Ox % Pulse Ox % 95  Pulse Ox Activity Level  At rest  Oxygen Delivery Room Air/ 21 %   Physical Exam: General: obese, NAD  HEENT: normocephalic, sclera nonicteric, oropharynx clear  Neck: supple, no JVD, no bruits  Chest: CTA B, no wheezing, good movement  Cardiac: RRR, no murmurs, no edema, 2+ pulses  Extremities: no C/C/E, FROM   Neurologic Exam: Mental Status: alert and oriented x 3, normal speech and language, follows complex commands;  slightly depressed mood  Cranial Nerves: PERRLA, EOMI, nl VF, face symmetric, tongue midline, shoulder shrug equal  Motor Exam: 5-/5 B, normal tone, no tremor  Deep Tendon Reflexes: 2+/4 B, plantars downgoing B, no Hoffman  Sensory Exam: decreased vibration below the knees, good propioception, nl temp  Coordination: FTN and HTS WNL   Lab Results: Thyroid:  15-Nov-15 04:58   Thyroid Stimulating Hormone 1.50 (0.45-4.50 (IU = International Unit)  ----------------------- Pregnant patients have  different reference  ranges for TSH:  - - - - - - - - - -  Pregnant, first trimetser:  0.36 - 2.50 uIU/mL)  LabObservation:  18-Nov-15 17:57   OBSERVATION Reason for Test  Hepatic:  17-Nov-15 05:09   Bilirubin, Total 0.3  Alkaline Phosphatase 83 (46-116 NOTE:  New Reference Range 06/23/14)  SGPT (ALT) 29 (14-63 NOTE: New Reference Range 06/23/14)  SGOT (AST) 23  Total Protein, Serum 6.4  Albumin, Serum  2.2  Routine Micro:  16-Nov-15 08:47   Micro Text Report BLOOD CULTURE   COMMENT                   NO GROWTH AEROBICALLY/ANAEROBICALLY IN 5 DAYS   ANTIBIOTIC                       Culture Comment NO GROWTH AEROBICALLY/ANAEROBICALLY IN 5 DAYS  Result(s) reported on 24 Oct 2014 at 08:00AM.  General Ref:  14-Nov-15 11:03   C-Reactive Protein ========== TEST NAME ==========  ========= RESULTS =========  = REFERENCE RANGE =  C-REACTIVE PROTEIN,QUANT  C-Reactive Protein, Quant C-Reactive Protein, Quant       [H  88.3 mg/L            ]  0.0-4.9               [facility]            No: [id number]           [address] [place], [place] [address]           Lindon Romp, MD         [phone]   Result(s) reported on 19 Oct 2014 at 07:19AM.  16-Nov-15 04:26   ANA Comprehensive Panel ========== TEST NAME ==========  ========= RESULTS =========  = REFERENCE RANGE =  ANA COMPREHENSIVE PANEL  ANA Comprehensive Panel Anti-DNA (DS) Ab Qn             [   1 IU/mL              ]               0-9               Negative      <5                                                   Equivocal  5 - 9                                                   Positive      >9 RNP Antibodies                  [   <0.2 AI              ]           0.0-0.9 Kerie Badger Antibodies                [   <0.2 AI              ]           0.0-0.9 Antiscleroderma-70 Antibodies   [   <0.2 AI              ]           0.0-0.9 Sjogren's Anti-SS-A             [   <0.2 AI              ]           0.0-0.9 Sjogren's Anti-SS-B          [   <0.2 AI              ]           0.0-0.9 Antichromatin Antibodies        [   <0.2 AI              ]           0.0-0.9 Anti-Jo-1                       [   <0.2 AI              ]           0.0-0.9 Anti-Centromere B  Antibodies     [   <0.2 AI              ]           0.0-0.9 See below:                      [   Final Report         ]                   Autoantibody                       Disease Association ------------------------------------------------------------         Condition                  Frequency ---------------------   ------------------------   --------- Antinuclear Antibody,    SLE, mixed connective Direct (ANA-D)           tissue diseases ---------------------   ------------------------   --------- dsDNA                    SLE                        40 - 60% ---------------------   ------------------------   --------- Chromatin                Drug induced SLE                90%                          SLE                        48 - 97% ---------------------   ------------------------   --------- SSA (Ro)                 SLE                        25 - 35%                          Sjogren's Syndrome         40 - 70%                          Neonatal Lupus                 100% ---------------------   ------------------------   --------- SSB (La)                 SLE                             10%                          Sjogren's Syndrome              30% ---------------------   -----------------------    --------- Sm (anti-Lakela Kuba)          SLE                        15 - 30% ---------------------   -----------------------    ---------  RNP                      Mixed Connective Tissue                          Disease                         95% (U1 nRNP,          SLE                        30 - 50% anti-ribonucleoprotein)  Polymyositis and/or                          Dermatomyositis                 20% ---------------------   ------------------------   --------- Scl-70 (antiDNA          Scleroderma (diffuse)      20 - 35% topoisomerase)           Crest                           13% ---------------------   ------------------------   --------- Jo-1                     Polymyositis and/or                           Dermatomyositis            20 - 40% ---------------------   ------------------------   --------- Centromere B             Scleroderma - Crest                          variant                         80%               [facility]            No: [id number]  [address], [place], [place] [address]           Lindon Romp, MD         [phone]   Result(s) reported on 20 Oct 2014 at 04:56PM.  Latex RA, Titer ========== TEST NAME ==========  ========= RESULTS =========  = REFERENCE RANGE =  LATEX RA & TITER  Rheumatoid Arthritis Factor RA Latex Turbid.                [   11.9 IU/mL           ]          0.0-13.9               [facility]       No: [id number]           [address], [place], [place] [address]           Lindon Romp, MD         [id number]   Result(s) reported on 20 Oct 2014 at 04:56PM.  17-Nov-15 05:09   Aldolase, Serum ========== TEST NAME ==========  ========= RESULTS =========  =  REFERENCE RANGE =  ALDOLASE  Aldolase Aldolase                        [   8.4 U/L              ]          3.3-10.3               [facility]            No: [id number]        [address] [place], [place] [address]           Lindon Romp, MD         [phone]   Result(s) reported on 21 Oct 2014 at 04:49PM.  Routine Chem:  13-Nov-15 21:02   Lipase 142 (Result(s) reported on 16 Oct 2014 at 10:37PM.)  eGFR (African American) >60  eGFR (Non-African American) >60 (eGFR values <58m/min/1.73 m2 may be an indication of chronic kidney disease (CKD). Calculated eGFR, using the MRDR Study equation, is useful in  patients with stable renal function. The eGFR calculation will not be reliable in acutely ill patients when serum creatinine is changing rapidly. It is not useful in patients on dialysis. The eGFR calculation may not be applicable to patients at the low and high extremes of body sizes, pregnant women, and  vegetarians.)  17-Nov-15 05:09   Iron Binding Capacity (TIBC) 254  Unbound Iron Binding Capacity 218  Iron, Serum  36  Iron Saturation 14 (Result(s) reported on 20 Oct 2014 at 08:40AM.)  Ferritin ([facility] 50 (Result(s) reported on 20 Oct 2014 at 08:45AM.)  Glucose, Serum  112  BUN 15  Creatinine (comp) 0.69  Sodium, Serum 143  Potassium, Serum 3.8  Chloride, Serum 106  CO2, Serum 29  Calcium (Total), Serum  8.4  Osmolality (calc) 287  Anion Gap 8 (Result(s) reported on 20 Oct 2014 at 07:48AM.)  Cardiac:  14-Nov-15 03:00   Troponin I < 0.02 (0.00-0.05 0.05 ng/mL or less: NEGATIVE  Repeat testing in 3-6 hrs  if clinically indicated. >0.05 ng/mL: POTENTIAL  MYOCARDIAL INJURY. Repeat  testing in 3-6 hrs if  clinically indicated. NOTE: An increase or decrease  of 30% or more on serial  testing suggests a  clinically important change)  17-Nov-15 05:09   CK, Total 29 (26-192 NOTE: NEW REFERENCE RANGE  01/05/2014)  Routine UA:  13-Nov-15 21:10   Color (UA) Yellow  Clarity (UA) Clear  Glucose (UA) 50 mg/dL  Bilirubin (UA) Negative  Ketones (UA) Negative  Specific Gravity (UA) 1.020  Blood (UA) 1+  pH (UA) 6.0  Protein (UA) Negative  Nitrite (UA) Negative  Leukocyte Esterase (UA) Negative (Result(s) reported on 16 Oct 2014 at 09:48PM.)  RBC (UA) 4 /HPF  WBC (UA) 3 /HPF  Bacteria (UA) NONE SEEN  Epithelial Cells (UA) <1 /HPF  Mucous (UA) PRESENT (Result(s) reported on 16 Oct 2014 at 09:48PM.)  Routine Hem:  13-Nov-15 21:02   Neutrophil % 71.7  Lymphocyte % 18.0  Monocyte % 8.7  Eosinophil % 1.0  Basophil % 0.6  Neutrophil # 4.9  Lymphocyte # 1.2  Monocyte # 0.6  Eosinophil # 0.1  Basophil # 0.0 (Result(s) reported on 16 Oct 2014 at 09:21PM.)  14-Nov-15 11:03   Erythrocyte Sed Rate  110 (Result(s) reported on 17 Oct 2014 at 11:36AM.)  17-Nov-15 05:09   Manual Diff MANUAL DIFF DONE  Result(s) reported on 20 Oct 2014 at 09:14AM.  Retic Count 2.2  Absolute Retic  Count 0.079 (Result(s) reported on 20 Oct 2014 at 09:15AM.)  Bands 10  Metamyelocyte 1  18-Nov-15 05:39   WBC (CBC) 7.2  RBC (CBC) 3.84  Hemoglobin (CBC)  10.8  Hematocrit (CBC)  33.2  Platelet Count (CBC) 350 (Result(s) reported on 21 Oct 2014 at 07:35AM.)  MCV 87  MCH 28.2  MCHC 32.6  RDW  17.5  Segmented Neutrophils 66  Lymphocytes 24  Monocytes 7  Eosinophil 1  Myelocyte 2  NRBC 1  Diff Comment 1 ANISOCYTOSIS  Diff Comment 2 POLYCHROMASIA  Diff Comment 3 PLTS VARIED IN SIZE  Diff Comment 4 LARGE PLATELETS  Result(s) reported on 21 Oct 2014 at 07:35AM.   Radiology Results: CT:    20-Nov-15 15:20, CT Head Without Contrast  CT Head Without Contrast   REASON FOR EXAM:    progressive dizziness/ataxia (has pacemaker)  COMMENTS:       PROCEDURE: CT  - CT HEAD WITHOUT CONTRAST  - Oct 23 2014  3:20PM     CLINICAL DATA:  Began having vertigo symptoms 11/18; ambulation has  worsened since this started. Still c/o dizziness, though resting  comfortably on my arrival. No new c/o this AM other than occ nausea,  but reports she is eating, drinking    EXAM:  CT HEAD WITHOUT CONTRAST    TECHNIQUE:  Contiguous axial images were obtained from the base of the skull  through the vertex without intravenous contrast.    COMPARISON:  05/01/2008    FINDINGS:  Ventricles are normal in configuration. There is ventricular and  sulcal enlargement reflecting moderate atrophy. No hydrocephalus    No parenchymal masses or mass effect. Periventricular white matter  hypoattenuation is noted consistent with mild chronic microvascular  ischemic change. There is no evidence of a recent infarct.    There are no extra-axial masses or abnormal fluid collections.    There is no intracranial hemorrhage.  Visualized sinuses and mastoid air cells clear.     IMPRESSION:  1. No acute intracranial abnormalities.  2. Moderate atrophy.  Mild chronic microvascular ischemic  change.      Electronically Signed    By: Sibyl Parr.D.    On: 10/23/2014 15:43         Verified By: Lasandra Beech, M.D.,   Radiology Impression: Radiology Impression: CT of head personally reviewed by me and shows moderate atrophy with moderate white matter changes   Impression/Recommendations: Recommendations:   prior notes reviewed by me reviewed by me    Dizziness-  this is very nonspecific and does not follow any specific neurologic pathways.  Per pt if it has been occuring for 3 weeks is most likely secondary to B12 deficiency but could be some autoimmune process as well.  Can not rule out some paraneoplastic syndrome as well Gait disturbance-  pt has several reasons to have an abnormal gait to include weakness from spinal stenosis vs. B12 deficiency, etc.;  pt has not really had adequate assessment of L-spine with CT with contrast;  probably deconditioned as well will likely need myelogram of lower back but this can be done as outpatient EMG/NCS as outpatient check folate and homocysteine agree with Cyanobalamin 1040mg daily IM up in chair TID needs daily PT ok to continue Lyrica at current dose wean narcotics as tolerated will follow briefly  Electronic Signatures: SJamison Neighbor(MD)  (Signed 21-Nov-15 15:39)  Authored: REFERRING PHYSICIAN, Primary Care Physician, Consult, History of Present Illness, Review  of Systems, PAST MEDICAL/SURGICAL HISTORY, HOME MEDICATIONS, ALLERGIES, Social/Family History, NURSING VITAL SIGNS, Physical Exam-, LAB RESULTS, RADIOLOGY RESULTS, Recommendations   Last Updated: 21-Nov-15 15:39 by Jamison Neighbor (MD)

## 2015-03-27 NOTE — Consult Note (Signed)
Brief Consult Note: Diagnosis: spinal stenosis.   Patient was seen by consultant.   Consult note dictated.   Comments: Hip and knee replacements appear stable without evidence of periprosthetic fracture or infection. Given the radiating nature of her leg pain and the close proximity to her epidural steroid injection, this is most likely a result of her spinal stenosis.  Electronic Signatures: Claud Kelp (MD)  (Signed [phone] 11:49)  Authored: Brief Consult Note   Last Updated: 14-Nov-15 11:49 by Claud Kelp (MD)

## 2015-03-27 NOTE — H&P (Signed)
PATIENT NAME:  Michele Schultz, Michele Schultz MR#:  [id number] DATE OF BIRTH:  1931-07-03  DATE OF ADMISSION:  10/17/2014  REFERRING PHYSICIAN:  Dr. Wynona Neat.    PRIMARY CARE PHYSICIAN:  Dr. Jens Som  ADMISSION DIAGNOSIS: Tachycardia and right lower extremity pain.   HISTORY OF PRESENT ILLNESS:  This is an 81 year old African American female who presents to the Emergency Department complaining of right lower extremity pain. The patient states that the pain radiates from her hip, travels laterally down her right thigh and into her posterior calf. This has been a chronic problem, but has been worsening over the last few days. The patient has difficulty standing up and has actually fallen multiple times at home due to the inability to bear weight.  She has not hit her head or had any loss of consciousness on any of these falls.  Some of the patient's complaints indicated that she was having some pain radiate into her abdomen or that pain was radiating from her abdomen into her lower extremity, which prompted an abdominal CT scan, which did not show any acute abnormality. The patient's pain was reasonably under control, but she had persistent tachycardia, which prompted the Emergency Department to call for admission.   REVIEW OF SYSTEMS:  CONSTITUTIONAL: The patient denies fever, but admits to some generalized weakness.  EYES: Denies inflammation or blurred vision.  EARS, NOSE AND THROAT: Denies tinnitus or difficulty swallowing.  RESPIRATORY: Denies cough or shortness of breath.  CARDIOVASCULAR: Denies chest pain or palpitations.  GASTROINTESTINAL: Admits to nausea, vomiting and occasional diarrhea and generalized abdominal pain.  GENITOURINARY: Denies dysuria, increased frequency or hesitancy of urination.  ENDOCRINE: Denies polyuria, polydipsia.  HEMATOLOGIC AND LYMPHATIC: Denies easy bruising or bleeding.  MUSCULOSKELETAL: Admits to arthralgias and sometimes myalgias.  INTEGUMENT: Denies rashes or lesions.   NEUROLOGIC: Denies numbness in her extremities or dysarthria.  PSYCHIATRIC: Denies depression or suicidal ideation.   PAST MEDICAL HISTORY: Hypertension, spinal stenosis, aortic stenosis, diabetes type 2, hyperlipidemia, osteoporosis.   PAST SURGICAL HISTORY: Two-vessel coronary artery bypass graft, bilateral total knee replacements, a right hip replacement, aortic valve replacement and pacemaker placement as well as at a lumbar facet block.   SOCIAL HISTORY: The patient is married, but lives alone. Her husband is in a nursing home due to strokes. She does not smoke, drink or do any drugs.   FAMILY HISTORY: The patient's mother and father both suffered from diabetes.   MEDICATIONS:  1.  Amlodipine 5 mg 1 tab p.o. daily.   2.  Aspirin 81 mg 1 tab p.o. daily.  3.  Astelin 137 mcg/inhalation, 1 spray to each nostril 2 times a day.  4.  Bentyl 10 mg, 2 capsules p.o. every 8 hours as needed for abdominal cramps.  5.  Cyclosporine ophthalmic solution 0.05% at 1 drop to each eye b.i.d.  6.  Furosemide 40 mg 1 tab p.o. daily as needed for swelling.  7.  Gabapentin 100 mg 2 capsules p.o. at bedtime.  8.  Losartan 25 mg 1 tab p.o. daily.  9.  Magnesium oxide 8% oral solution 15 mL p.o. every day at bedtime as needed for constipation.  10. Metoprolol succinate 50 mg 1 tablet p.o. daily.  11. Nitroglycerin 0.4 mg sublingually, 1 tablet sublingually every 5 minutes as needed for chest pain.  12. Norco 7.5 mg/325 mg 1 tablet p.o. every 4 hours.  13. Ondansetron 4 mg 1 to 2 tablets p.o. every 8 hours as needed for nausea or vomiting.  14. Pantoprazole  40 mg extended release tablet 1 tab p.o. b.i.d.  15. Potassium chloride 10 mEq extended release 1 tablet p.o. daily.  16. Promethazine 25 mg 1 tablet p.o. every 6 hours as needed for nausea.  17. Vitamin B12 at 1000 mcg per mL injectable solution, 1 mL intramuscularly every month.   ALLERGIES: BACTRIM, CITALOPRAM, CYMBALTA AND IMIPRAMINE.   PERTINENT  LABORATORY RESULTS AND RADIOGRAPHIC FINDINGS: Serum glucose is 206, BUN 14, creatinine 0.83, sodium 138, potassium 3.6, chloride 103, bicarbonate 25, calcium is 8.4, lipase 142, serum albumin is 2.5, alkaline phosphatase is 113, AST is 39, ALT 43.  Troponins have been negative.  White blood cell count 6.8, hemoglobin 11.4, hematocrit 35.5, platelet count 271,000.  Ultrasound of the right lower extremity shows no evidence of deep venous thrombosis.  CT scan of the abdomen and pelvis with contrast shows no acute intracranial or pelvic process. The appendix was not seen. The patient does have some cholelithiasis with mild diverticulosis and some nonobstructing bilateral nephrolithiasis measuring up to 3 mm. There is a cyst on the left kidney, which radiology recommends follow-up in 6 months.   PHYSICAL EXAMINATION:  VITAL SIGNS: Temperature is 98.6, pulse is 102, respirations 18, blood pressure 124/77, pulse oximetry 98% on 2 liters of oxygen via nasal cannula.  GENERAL: The patient is alert and oriented x 3 in no apparent distress.  HEENT: Normocephalic, atraumatic. Pupils equal, round, and reactive to light and accommodation. Extraocular movements are intact. Mucous membranes are moist.  NECK: Trachea is midline. No adenopathy.  CHEST: Symmetric and atraumatic.  CARDIOVASCULAR: Tachycardic rate but normal S1, S2. No rubs, clicks, or murmurs appreciated.  LUNGS: Clear to auscultation bilaterally. Normal effort and excursion.  ABDOMEN: Positive bowel sounds, soft, mildly tender throughout the abdomen without guarding or rebound.  The abdomen is slightly distended and there is no hepatosplenomegaly.  GENITOURINARY: Deferred.  MUSCULOSKELETAL: The patient moves her upper extremities equally, but limits range of motion of her right leg due to pain.  She has surprisingly 5/5 strength in the lower extremities bilaterally, but has 4+/5 strength on the left upper extremity compared to the right.  SKIN: No rashes  or lesions.  EXTREMITIES: No clubbing, cyanosis, or edema.  NEUROLOGIC: Cranial nerves II through XII are grossly intact. There are normal patellar tendon reflexes bilaterally.  PSYCHIATRIC: Mood is normal. Affect is congruent.   ASSESSMENT AND PLAN: This is an 81 year old female admitted for tachycardia and right lower extremity pain.  1.  Right lower extremity pain is likely a consequence of her spinal stenosis. The patient recently underwent a lumbar facet block; however, differential diagnosis for pain is broad. It includes iliotibial band syndrome, meralgia parasthetica, also muscle strain or sciatica.  The patient had a negative lower extremity ultrasound for deep vein thrombosis.  She is not hypoxic at baseline and I have advised the nurses to take the supplemental oxygen off.  It is very unlikely that her sinus tachycardia is caused by a pulmonary embolism or thrombus, nor is her pain likely due to deep venous thrombosis.  X-ray of the femur and hip are normal. We will try to manage the patient's pain and will involve her pain medicine specialist if needed.  2.  Tachycardia, sinus.  Continue metoprolol. A potential etiology for her tachycardia could be that she had emesis and did not actually absorb any of her medications. We will also check a thyroid stimulating hormone.  3.  Hypertension. Continue losartan and amlodipine per her home regimen.  4.  Abdominal pain. This appears chronic based on previous notes.  It is certainly intermittent. The CT scan did not show any acute abnormality. I have added some Bentyl to her regimen, as well as antiemetics and given her a bowel regimen.  She could have some gastroparesis, but her diagnosis of diabetes is unclear.  I have ordered a check of her hemoglobin A1c.  5.  Osteopenia. We will check a vitamin D level.  6.  Morbid obesity. The patient's body mass index is 44.7, I have encouraged a balanced diet and exercise.  7.  Deep vein thrombosis prophylaxis  on heparin.  8.  Gastrointestinal prophylaxis. Consider an H2-blocker before meals if needed.   The patient is a FULL CODE.   TIME SPENT ON ADMISSION ORDERS AND PATIENT CARE: Approximately 40 minutes.    ____________________________ Norva Riffle. Marcille Blanco, MD msd:DT D: 10/17/2014 08:28:49 ET T: 10/17/2014 08:50:57 ET JOB#: [id number]  cc: Norva Riffle. Marcille Blanco, MD, <Dictator> Norva Riffle Valary Manahan MD ELECTRONICALLY SIGNED 10/22/2014 4:43
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
## 2015-03-27 NOTE — Consult Note (Signed)
PSychiatry: PAtient seen. She reports she is still very anxious but feeling a little better.Denies any suicidal ideation. States now she would like to go to a rehab as she knows she is not able to walk. Affect dysphoric but reactive. Thoughts slow but lucid. Alert and oriented to situation. Unclear to me whether she is having any side effects from the prozac. Seemed a little shakey when I first woke her but then seemed to return to baseline.  nos, mild dementia. No change to treatment  Electronic Signatures: Alvetta Hidrogo, Madie Reno (MD)  (Signed on 20-Nov-15 21:53)  Authored  Last Updated: 20-Nov-15 21:53 by Gonzella Lex (MD)

## 2015-03-27 NOTE — Consult Note (Signed)
PATIENT NAME:  Michele Schultz, PANEBIANCO MR#:  [id number] DATE OF BIRTH:  Nov 15, 1931  DATE OF CONSULTATION:  10/20/2014  REFERRING PHYSICIAN:   CONSULTING PHYSICIAN:  Meda Klinefelter., MD  REFERRING PHYSICIAN:  Ramonita Lab, MD.   REASON FOR CONSULTATION: Elevated inflammatory tests.    HISTORY OF PRESENT ILLNESS:  An 81 year old African-American female. Retired Marine scientist. Long history of osteoarthritis. She has had a right hip replacement, bilateral knee replacements. She has known osteoarthritis of the right shoulder. She has had lumbar disk disease. She has been on chronic narcotics. She has been seen by orthopedics and physiatry with injections. She has had aortic valve disease and had aortic valve replacement after progressive aortic stenosis. In the last 2-3 months she has had a deterioration in her ability to ambulate. She is weaker with ambulation. Gets some short-winded.   She has also had pain in her back, she had epidural. She has had pain in the right leg. She had some nausea and vomiting with hospitalization. She also had tachycardia with heart rate up to 140. She has had no definite fever. Laboratory evaluation showed a low albumin at 2.0, hemoglobin at 10.3 without thrombocytosis. Creatinine was normal. Liver functions normal. Sedimentation rate of 110. Most recent sedimentation rate in the office about 3 months ago was 30. CRP of 88. She has not had any definite swollen joints. She has not had proteinuria. She did have CT of the abdomen which showed some scarring at the right base with bronchiectasis and question some fibrosis. She had blood cultures pending. She has kidney stones. She had a CT of the hip and the lumbar spine with no significant abnormality other than some degenerative change of the lumbar spine without any central stenosis. She has not had swollen joints. Legs burn and she feels like she has to stick them out from the cover.   On direct questioning she has occasional  headaches. She does take a lot of narcotics and sometime she will take a narcotic for a headache. She has had no jaw claudication. Does have some night sweats. She has not had pain in the left shoulder. She has not had pain in the left hip. She has not had any swelling in her hands or her operated knees. Occasionally her ankles swell. There has been no significant proteinuria. Urinalysis unremarkable.   PAST MEDICAL HISTORY:  Aortic valve disease, borderline diabetes, osteoarthritis.  SOCIAL HISTORY: No cigarettes or alcohol.   FAMILY HISTORY: Negative for rheumatic diseases.   REVIEW OF SYSTEMS: As above.   PHYSICAL EXAMINATION:  GENERAL: Pleasant elderly female seen in bed without any distress.  VITAL SIGNS:  Heart rate by me was 104 at rest, 124/78, afebrile.  HEENT:  Nontender temporal arteries.  Sclerae clear.  NECK: Good carotid upstroke. No thyromegaly.  LUNGS:  Faint crackles both bases.  HEART:  Minimal systolic murmur. No insufficiency murmur.  ABDOMEN: Nontender abdomen.   EXTREMITIES: Trace lower extremity edema. Distal pulses appreciated.  MUSCULOSKELETAL: Good range of motion of cervical spine. Mild decreased abduction and internal rotation of the right shoulder compared to the left. There is no effusion. Hands without synovitis. Both hips move well. Knees have been operated. No effusions. Ankles and toes without synovitis. Symmetric knee jerks.  Absent ankle jerks.   IMPRESSION:  1.  Elevated sedimentation rate and CRP without obvious inflammation or infection. Raises the question of subacute bacterial endocarditis or occult temporal arteritis. Consider gammopathy. 2.  Tachycardia with history of cardiomyopathy from valvular  heart disease.  3.  Possible component of interstitial lung disease, could be part of her elevated sedimentation rate   RECOMMENDATIONS:  1.  Consider chest x-ray.  2.  Consider temporal artery biopsy. Would not give her empiric steroids. Does not seem  like it is PMR. There is no significant morning stiffness. She does not have symmetric symptoms.  Agree with blood cultures. Consider echocardiogram to look for endocarditis.      ____________________________ Meda Klinefelter., MD gwk:bu D: 10/20/2014 19:40:03 ET T: 10/20/2014 20:04:48 ET JOB#: [id number]  cc: Meda Klinefelter., MD, <Dictator> Adin Hector, MD Ovidio Hanger MD ELECTRONICALLY SIGNED 10/21/2014 10:09

## 2015-03-27 NOTE — Discharge Summary (Signed)
PATIENT NAME:  Michele Schultz, Michele Schultz MR#:  [id number] DATE OF BIRTH:  Nov 13, 1931  DATE OF ADMISSION:  10/23/2014 DATE OF DISCHARGE:  10/26/2014   FINAL DIAGNOSES:  1.  Spinal stenosis with peripheral neuropathy.  2.  Severe B12 deficiency.  3.  Adult onset diabetes mellitus, diet controlled.  4.  Depression with anxiety.  5.  Elevated inflammatory markers, etiology unclear.  6.  Coronary artery disease with prior bypass.  7.  History of valvular heart disease with bioprosthetic valvular heart placement repair.  8.  Status post pacemaker placement.  9.  Recurrent abdominal pain and nausea with prior extensive evaluation.  10. Mild iron deficiency anemia.   HISTORY AND PHYSICAL: Please see dictated admission history and physical.   HOSPITAL COURSE: The patient was admitted with worsening ambulation, with complaints of nausea, vomiting. She has a history of spinal stenosis and it was felt that likely there had been some worsening of her spinal stenosis.  Due to her pacemaker, she could not undergo MRI.  Due to her diabetes, she is not a good steroid candidate.  She was noted to have severe B12 deficiency and was started on supplementation for this.  She was evaluated by neurology who felt that this was the likely source of her discomfort, and they recommended consideration for CT myelogram as an outpatient, although the patient would be very reluctant to undergo any surgery, would be high risk for this, and if not any planning to do surgery, CT myelogram may not be beneficial.   The patient has a history of depression and anxiety, and certainly became more depressed during her stay. She was evaluated by psychiatry. They felt she was competent to make her medical decisions, and she was started on fluoxetine. She tolerated this without major issues.   She is noted to have markedly elevated inflammatory markers, sedimentation rate greater than 100, C-reactive protein also elevated.  Blood cultures were  negative.  Echocardiogram was performed, which revealed some chronic left-sided diastolic congestive heart failure, but no evidence of vegetation on the valve.  She was evaluated by rheumatology. This will need to be followed up as an outpatient.   The patient continued to have trouble with ambulation, and it became clear that she would not be able to return home into a safe environment.  Initially, there were some insurance issues, fortunately, this was eventually resolved with the help of care management.  At this time, she will be discharged to a skilled nursing facility for rehabilitation in stable condition.  Her physical activity should be up with a walker or wheelchair as tolerated. She will follow up with the nursing home physician. She should follow a no added salt, no concentrated sweets diet. Recommendations that she be weighed daily, with physician called for more than 2 pounds gain in 1 day or 5 pounds in 1 week, or increasing signs or symptoms of heart failure.  Physical therapy and occupational therapy will evaluate and treat the patient.  Consideration for adding iron therapy is left to the discretion of the nursing home physician; however, this was held during this hospitalization due to complaints of some nausea.   DISCHARGE MEDICATIONS:  1.  Furosemide 40 mg p.o. daily as needed for edema.  2.  Pantoprazole 40 mg p.o. b.i.d.  3.  Potassium 10 meq p.o. daily.  4.  Ondansetron 4 mg 1 to 2 tablets p.o. every 8 hours as needed for nausea or vomiting.  5.  Norco 7.5/325, 1 p.o. every 4  hours p.r.n. severe pain, maximum of 5 pills a day.  6.  Pro-Air 2 puffs every 6 hours as needed for wheezing.  7.  Betamethasone Clotrimazole topically b.i.d. p.r.n. rash.  8.  Atorvastatin 80 mg p.o. daily 9.  Magnesium hydroxide 15 mL p.o. daily as needed for constipation.  10. Nitroglycerin 0.4 mg sublingually every 5 minutes x 3 p.r.n. chest pain.  11. Lyrica 100 mg p.o. t.i.d.  12. Aspirin 81 mg  p.o. daily.  13. Dicyclomine 10 mg 2 capsules p.o. every 8 hours as needed for bladder spasm.  14. Colace 100 mg p.o. b.i.d. as needed as stool softener.  15. Vitamin B12 at 1000 mcg p.o. daily.  16. Fluoxetine 10 mg p.o. daily.  17.  losartan 25 mg p.o. daily.   Gabapentin was held. The Lopressor was stopped secondary to relatively low blood pressure; however, her heart rate blood pressure rises, she was previously on Toprol-XL 50 mg daily. Promethazine and Carafate were also stopped.   RECOMMENDATIONS:  Check her blood sugar daily. She did not require any treatment here, however, and if she is not requiring treatment, this may no longer be necessary.       ____________________________ Adin Hector, MD bjk:DT D: 10/26/2014 13:13:14 ET T: 10/26/2014 13:37:33 ET JOB#: [id number]  cc: Adin Hector, MD, <Dictator> Ramonita Lab MD ELECTRONICALLY SIGNED 10/27/2014 8:09

## 2015-03-27 NOTE — Consult Note (Signed)
Brief Consult Note: Diagnosis: depression, mild. Dementia, mild.   Patient was seen by consultant.   Consult note dictated.   Orders entered.   Comments: Psychiatry: PAtient seen and chart reviewed. Patient reports symptoms of mild depression without any hopelessness or psychosis. No suicidality. Suggest start antidepressant medication low dose fluoxitine.  Based on exam today patient understands the problems associated with discharge to independent living and  the potential risks. Appears capable of making decisions. She also tells me she would agree to go to rehab or skilled nursing if she was unable to stand independently and if it could be paid for.  Electronic Signatures: Gonzella Lex (MD)  (Signed [phone] 18:39)  Authored: Brief Consult Note   Last Updated: 19-Nov-15 18:39 by Gonzella Lex (MD)

## 2015-03-28 NOTE — Discharge Summary (Signed)
PATIENT NAME:  Michele Schultz, Michele Schultz MR#:  [id number] DATE OF BIRTH:  [birth year]  DATE OF ADMISSION:  11/27/2011 DATE OF DISCHARGE:  11/30/2011  FINAL DIAGNOSES:  1. Fevers, chills, likely with acute sinusitis and acute viral illness.  2. Systemic inflammatory response syndrome secondary to #1.  3. Coronary artery disease with recent bypass surgery November 2012.  4. History of aortic valve bioprosthetic replacement 10/2011.  5. Complete heart block during surgery requiring pacemaker placement, 10/2011.  6. Hypertension.  7. Stress urinary incontinence.  8. Vitamin B12 deficiency.  9. Gastroesophageal reflux disease.   10. Osteoarthritis and degenerative disk disease with spinal stenosis and chronic pain syndrome.  11. Hyperlipidemia.  12. Adult onset diabetes mellitus.  13. History of iron deficiency anemia.  14. Diverticulosis.  15. Anxiety.  16. History of bilateral knee surgeries.  17. Status post bilateral cataract surgeries.  18. Right hip replacement.  19. History of sinus surgery.   HISTORY AND PHYSICAL: Please see dictated admission history and physical.   Gonzalez: Patient was admitted with high fevers, myalgias, shortness of breath, nonproductive cough. She had been noted to have fevers to 101, 102. Flu swab was negative. She had been on antibiotics and was continued on this. Chest x-ray was obtained, which was negative for infiltrate. Blood cultures were negative. Urinalysis was unremarkable, and culture also negative. She continued to have cough, nonproductive. She developed fevers to 102 on the second night of hospitalization, and her antibiotic spectrum was broadened. Blood cultures, urine, chest x-ray all repeated, and these were again unremarkable. She had no fevers over the last 48 hours, and is ambulating around the room on room air, feels like she is close to baseline. Her course was discussed briefly with infectious disease, who felt that given her  negative blood cultures x4, lack of further fevers, that it would be very unlikely this would be related to her valve, and so further imaging of the valve was held particularly since she was going to be continued on antibiotics regardless. It was felt most likely that she had a viral illness, although possibility of acute sinusitis, given her sinus history, and so at this time was felt reasonably safe to continue her on antibiotics. She wished to go back to the nursing home on Thursday. She reports that she was going to have to pay to hold the bed and she did not wish to do this. She is aware that exact etiology of her fevers and systemic inflammatory response syndrome is not entirely elucidated, but she is comfortable leaving at this time.   DISCHARGE INSTRUCTIONS: She will be discharged in stable condition with her physical activity to be up as tolerated. She will be on fall and bleeding precautions. She will need fingersticks checked daily with physician called for less than 70 or greater than 300. Diet should be no added salt, no concentrated sweets. Physical therapy should evaluate and treat the patient. She will need a MET-B, CBC, and INR in the next four days, results to the nursing home physician. Will anticipate her following up with Dr. Rockey Situ as scheduled and an appointment was made for the patient to follow up with Dr. Richardson Landry, ENT, to further evaluate her continued complaints of cough.   DISCHARGE MEDICATIONS:  1. Coumadin 7 mg p.o. at bedtime. 2. Colace 100 mg p.o. b.i.d.  3. Enteric-coated aspirin 81 mg p.o. daily. 4. Milk of magnesia 30 mL p.o. b.i.d. p.r.n. constipation.  5. Neurontin 200 mg p.o. at bedtime  for neuropathy from her spinal stenosis.  6. Oxycodone 5 mg p.o. every six hours p.r.n. severe pain.  7. Amlodipine 5 mg p.o. daily. 8. Lipitor 40 mg p.o. at bedtime.  9. Vitamin D 2000 units p.o. daily for vitamin D deficiency.  10. Benicar 20 mg p.o. daily.  11. Toprol-XL 50 mg  p.o. daily.  12. Oxybutynin ER 10 mg p.o. daily.  13. Pantoprazole 40 mg p.o. b.i.d. before meals.  14. Astelin one spray each nostril b.i.d.  15. Mucinex 600 mg p.o. b.i.d. p.r.n. cough and congestion.  16. Ceftin 500 mg p.o. b.i.d. x10 days.  17. Prednisone taper 40 mg daily, tapering x10 mg every two days until done.  18. Metformin 500 mg p.o. b.i.d. before meals.  19. Tessalon Perles 200 mg p.o. t.i.d. p.r.n. cough.  20. Restasis 0.05% drops, one drop to both eyes b.i.d. for dry eye.  21. Vitamin B12 1000 mcg IM every month for vitamin B12 deficiency.  22. MiraLax 17 grams p.o. daily.  23. Valium 5 mg p.o. b.i.d. p.r.n. anxiety.   ____________________________ Adin Hector, MD bjk:cms D: 11/30/2011 08:23:40 ET T: 11/30/2011 08:51:39 ET JOB#: [id number]  cc: Tama High III, MD, <Dictator> Minna Merritts, MD Ramonita Lab MD ELECTRONICALLY SIGNED 12/06/2011 7:50

## 2015-03-28 NOTE — H&P (Signed)
PATIENT NAME:  Michele Schultz, Michele Schultz MR#:  [id number] DATE OF BIRTH:  1931-10-10  DATE OF ADMISSION:  11/27/2011  REFERRING PHYSICIAN: Dr. Jimmye Norman    PRIMARY CARE PHYSICIAN: Dr. Ramonita Lab    PRIMARY CARDIOLOGIST: Dr. Rockey Situ   PRESENTING COMPLAINT: The patient presented from [facility] rehab with complaints of fevers, chills, productive cough, and wheezing.   HISTORY OF PRESENT ILLNESS: Michele Schultz is a pleasant 81 year old woman with history of hypertension, hyperlipidemia, diabetes, history of coronary artery disease and severe aortic stenosis status post recent bypass, and aortic valve replacement with bovine cardiac valve in November 2012 at [facility]. She has been discharged to [facility] rehab and has been getting physical therapy there. The patient reports developing cough with yellowish-greenish sputum for the past two days. She endorses some wheezing this afternoon, developed fevers and chills. She has baseline shortness of breath and dyspnea on exertion but reports that that has actually improved since her surgery. She endorses also chest pain in the middle of her chest and lower spine pain but that has been chronic and unchanged. She denies any palpitations or presyncope or syncope. No orthopnea or PND. No worsening lower extremity swelling. The patient is unaware of any sick contact, although she is at a rehab facility.   PAST MEDICAL HISTORY:  1. Admitted 11/13 to 10/30/2011 for bypass surgery and aortic valve replacement. She had SVG to LAD and SVG to obtuse marginal with a bovine pericardial valve replacement. She had hospital course complication of acute blood loss anemia, volume overload, and had a complete heart block requiring pacemaker placement on 10/20/2011.  2. Hypertension.  3. Stress incontinence.  4. Vitamin B12 deficiency.  5. Gastroesophageal reflux disease.  6. Osteoarthritis and spinal stenosis.  7. Hyperlipidemia.  8. Diabetes.  9. Coronary artery disease status  post bypass.  10. Aortic valve stenosis status post aortic valve replacement.  11. Iron deficiency anemia.  12. September 2010 EGD revealing for hiatal hernia, nonbleeding erosive gastropathy, and colonoscopy showing erythematous mucosa and erosions with diverticulosis and internal hemorrhoids.   PAST SURGICAL HISTORY:  1. Right knee surgery.  2. Left knee surgery.  3. Bilateral cataract surgery.  4. Right hip replacement.  5. Sinus surgery.  ALLERGIES: Proton pump inhibitor, Bactrim, citalopram, Cymbalta, imipramine, Vicodin; however, at [facility] facility she has been actually taking Protonix and oxycodone.   MEDICATIONS:  1. Senokot tablets 2 tablets in the morning.  2. Oxycodone 5 mg t.i.d.  3. Phenergan 25 mg every six hours as needed.  4. Oxycodone 5 mg 1 to 2 tablets every four hours as needed.  5. Milk of Magnesia 30 mL daily as needed.  6. Vitamin D 2000 international units 1 capsule daily.  7. Metformin 500 mg b.i.d.  8. Olmesartan 20 mg daily.  9. Amlodipine 5 mg daily.  10. Aspirin 81 mg daily.  11. Atorvastatin 40 mg daily.  12. Restasis 0.05% ophthalmic drops one drop both eyes b.i.d.  13. Metoprolol XL 50 mg daily.  14. Oxybutynin 10 mg daily.  15. Pantoprazole 40 mg b.i.d.  16. Vitamin B12 1000 mcg q. monthly. 17. Senokot-S 2 tablets daily  18. MiraLAX 17 grams b.i.d.  19. Dulcolax suppository 10 mg daily as needed.  20. Gabapentin 200 mg daily.  21. Valium 5 mg 1 tablet every eight hours as needed.  22. Coumadin 7 mg on Monday, Wednesdays, Fridays, Saturdays, and Sundays and 6 mg on Tuesdays and Thursdays.   FAMILY HISTORY: Three brothers and one  sister had heart disease.   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 No tobacco, alcohol, or drug use.  REVIEW OF SYSTEMS: CONSTITUTIONAL: She endorses fevers, chills, and nausea but no vomiting. EYES: History of cataracts status post resection  bilaterally. ENT: No dysphagia, epistaxis, or discharge. RESPIRATORY: She endorses cough and wheezing. No hemoptysis. Endorses shortness of breath and dyspnea on exertion as per history of present illness. CARDIOVASCULAR: As per history of present illness, reports midline chest pain that has been unchanged. No palpitations or syncope. GI: She endorses nausea. No vomiting. She has constipation and takes medications to help with bowel movement. No abdominal pain, hematemesis, or melena. GU: No dysuria or hematuria. ENDOCRINE: No polyuria or polydipsia. HEME: No easy bleeding. She has easy bruising. SKIN: No ulcers. MUSCULOSKELETAL: She has chronic low back pain. NEUROLOGIC: No one-sided weakness or numbness. She has generalized weakness. PSYCH: Denies any suicidal ideation.   PHYSICAL EXAMINATION:   VITAL SIGNS: Temperature 101.5, pulse 104, respiratory rate 22, blood pressure 140/64, sating at 97% on room air.   GENERAL: Lying in bed in no apparent distress. She is weak appearing.   HEENT: Normocephalic, atraumatic. Pupils are equal and symmetric. Nasal cannula in place. She has moist mucous membrane.   NECK: Soft and supple. No adenopathy or JVP.   CARDIOVASCULAR: Tachycardic with 2 to 3+ systolic murmur. No rub or gallop.   LUNGS: Without wheezing or crackles. No use of accessory muscles or increased respiratory effort.   ABDOMEN: Soft. Positive bowel sounds. No mass appreciated.   EXTREMITIES: Trace edema with left greater than right. Dorsal pedis pulses intact.   MUSCULOSKELETAL: No joint effusion.   SKIN: No ulcers.   NEUROLOGIC: Symmetrical strength. No focal deficits.   PSYCH: She is alert and oriented. The patient is cooperative.   PERTINENT LABS AND STUDIES: Urinalysis with specific gravity 1.018, pH 6, RBC 5 per high-power field, WBC 1 per high-power field. Lactic acid 2.8. Influenza A and B negative. Chest x-ray without evidence of acute findings. WBC 10.4, hemoglobin 10.6,  hematocrit 33.7, platelet 258, MCV 87, glucose 144, BUN 11, creatinine 0.89, sodium 139, potassium 3.8, chloride 103, carbon dioxide 24, calcium 9. LFTs within normal limits. Albumin 3.3. INR 1.8. EKG with sinus tachycardia, rate of 104. No ST changes.   ASSESSMENT AND PLAN: Michele Schultz is a 81 year old woman with history of aortic stenosis, coronary artery disease status post bypass and aortic valve replacement, heart block status post pacemaker placement, diabetes, and hypertension presenting from [facility] rehab with fevers, chills, shortness of breath, and productive cough.  1. Systemic inflammatory response syndrome possibly in the setting of viral process versus acute bronchitis. Influenza is negative, noted fever, tachycardia, and elevated lactic acid. Chest x-ray does not show infiltrate. Will repeat posthydration. Blood cultures have been sent. Will send sputum culture. Continue empirically on Levaquin for now. Urinalysis is unrevealing.  2. Chest pain, seems chronic and unchanged, as above status post cardiac surgery with hospital course complicated by heart block status post pacemaker. Also, acute blood loss and volume overload. Will continue to cycle cardiac enzymes. Continue on tele. Her hemoglobin appears to be stable. Restart aspirin. Increase her Coumadin. Restart metoprolol and atorvastatin.  3. Acquired coagulopathy on Coumadin status post aortic valve replacement. Her INR is subtherapeutic. As above, increase Coumadin regimen. Follow INR.  4. Diabetes. Hold metformin. Start patient on sliding scale insulin.  5. Hypertension. Restart amlodipine, olmesartan, and metoprolol.  TIME SPENT: Approximately 55 minutes spent on patient care.   ____________________________ Rita Ohara, MD ap:drc D: 11/27/2011 00:11:00 ET T: 11/27/2011 07:38:59 ET JOB#: [id number]  cc: Brien Few Damyah Gugel, MD, <Dictator> Adin Hector, MD Rita Ohara MD ELECTRONICALLY SIGNED 12/23/2011  2:19

## 2015-04-06 ENCOUNTER — Encounter: Payer: Medicare Other | Admitting: Internal Medicine

## 2015-07-06 ENCOUNTER — Encounter: Payer: Self-pay | Admitting: Cardiovascular Disease

## 2015-07-06 ENCOUNTER — Ambulatory Visit (INDEPENDENT_AMBULATORY_CARE_PROVIDER_SITE_OTHER): Payer: Medicare Other | Admitting: Cardiovascular Disease

## 2015-07-06 VITALS — BP 114/75 | HR 82 | Ht 64.0 in | Wt 250.5 lb

## 2015-07-06 DIAGNOSIS — I10 Essential (primary) hypertension: Secondary | ICD-10-CM

## 2015-07-06 DIAGNOSIS — Z951 Presence of aortocoronary bypass graft: Secondary | ICD-10-CM

## 2015-07-06 DIAGNOSIS — R0602 Shortness of breath: Secondary | ICD-10-CM | POA: Diagnosis not present

## 2015-07-06 DIAGNOSIS — I4891 Unspecified atrial fibrillation: Secondary | ICD-10-CM

## 2015-07-06 DIAGNOSIS — Z954 Presence of other heart-valve replacement: Secondary | ICD-10-CM

## 2015-07-06 DIAGNOSIS — I25810 Atherosclerosis of coronary artery bypass graft(s) without angina pectoris: Secondary | ICD-10-CM | POA: Diagnosis not present

## 2015-07-06 DIAGNOSIS — Z953 Presence of xenogenic heart valve: Secondary | ICD-10-CM

## 2015-07-06 DIAGNOSIS — R5382 Chronic fatigue, unspecified: Secondary | ICD-10-CM

## 2015-07-06 DIAGNOSIS — Z952 Presence of prosthetic heart valve: Secondary | ICD-10-CM

## 2015-07-06 NOTE — Assessment & Plan Note (Signed)
Currently with no symptoms of angina. No further workup at this time. Continue current medication regimen. 

## 2015-07-06 NOTE — Progress Notes (Signed)
Patient ID: Michele Schultz, female    DOB: 03-04-1931, 81 y.o.   MRN: [id number]  HPI Comments: 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 She did have postoperative anemia and difficulty mobilizing.  Following her surgery, she had  severe back pain, spinal stenosis. She  had epidural shots x2 with mild relief She presents today for follow-up of her CAD and AVR History of fibromyalgia  She reports that she has fatigue all the time, sleeps all of time. Presents in a wheelchair. Currently working with PT and OT. No regular exercise program. Weight continues to be an issue. She feels one of her medications is making her sleepy. She does take a pain pill every 4 hours. She tried to cut back on the pain medication but had worsening pain.  EKG on today's visit shows normal sinus rhythm with rate 82 bpm, no significant ST or T-wave changes prolonged QTC 486 ms  Other past medical history Last echocardiogram March 2014 looking at her aortic valve  chronic shortness of breath.   She has chronic  upper airway wheezing. She denies any significant chest pain concerning for angina.    Allergies  Allergen Reactions  . Citalopram     Altered mental status  . Cymbalta [Duloxetine Hcl]     Caused sedation  . Imipramine   . Proton Pump Inhibitors     Current Outpatient Prescriptions on File Prior to Visit  Medication Sig Dispense Refill  . albuterol-ipratropium (COMBIVENT) 18-103 MCG/ACT inhaler Inhale 2 puffs into the lungs every 6 (six) hours as needed for wheezing. 1  Inhaler 4  . amLODipine (NORVASC) 5 MG tablet Take 1 tablet (5 mg total) by mouth daily. 30 tablet 6  . aspirin 81 MG tablet Take 81 mg by mouth daily.      Marland Kitchen azelastine (ASTELIN) 137 MCG/SPRAY nasal spray Place 1 spray into the nose 2 (two) times daily. Use in each nostril as directed    . benzonatate (TESSALON) 100 MG capsule Take 200 mg by mouth 3 (three) times daily as needed.    . cycloSPORINE (RESTASIS) 0.05 % ophthalmic emulsion Place 1 drop into both eyes 2 (two) times daily.     Marland Kitchen gabapentin (NEURONTIN) 100 MG capsule Take 200 mg by mouth at bedtime.    Marland Kitchen HYDROcodone-acetaminophen (NORCO) 10-325 MG per tablet Take 1 tablet by mouth every 4 (four) hours as needed.    . magnesium hydroxide (MILK OF MAGNESIA) 400 MG/5ML suspension Take by mouth daily as needed.    . metoprolol succinate (TOPROL-XL) 50 MG 24 hr tablet TAKE 1 TABLET DAILY (8AM) 30 tablet 6  . nitroGLYCERIN (NITROSTAT) 0.4 MG SL tablet Place 1 tablet (0.4 mg total) under the tongue every 5 (five) minutes as needed for chest pain. 90 tablet 3  . pantoprazole (PROTONIX) 40 MG tablet Take 40 mg by mouth 2 (two) times daily.      . potassium chloride (K-DUR) 10 MEQ tablet Take 1 tablet (10 mEq total) by mouth daily. 90 tablet 3  . promethazine (PHENERGAN) 25 MG tablet Take 1 tablet (25 mg  total) by mouth every 6 (six) hours as needed for nausea or vomiting. 30 tablet 0  . Cyanocobalamin (VITAMIN B-12) 1000 MCG/15ML LIQD Inject 1,000 mcg as directed every 30 (thirty) days.      Marland Kitchen losartan (COZAAR) 25 MG tablet Take 1 tablet (25 mg total) by mouth daily. 25 tablet 90   No current facility-administered medications on file prior to visit.    Past Medical History  Diagnosis Date  . Hypertension   . Anemia   . Neuralgia, neuritis, and radiculitis, unspecified   . Coronary artery disease     s/p CABG 11/12  . Aortic valve disorders     s/p AVR for severe AS 11/12  . Onychia and paronychia of toe   . Osteoarthrosis, unspecified  whether generalized or localized, pelvic region and thigh     mainly in her back and knees  . Enthesopathy of hip region   . Esophageal reflux     followed by Dr.Seigal. stabilized with a combination of Nexium and Zantac  . Knee joint replacement by other means   . Stress incontinence, female     followed by Dr.Cope  . Atrioventricular block, complete   . Cardiac pacemaker -st Judes     11/12  . Chronic diastolic heart failure     ef 55%echo 11/12  . Type II or unspecified type diabetes mellitus without mention of complication, not stated as uncontrolled     pt. reports that she is borderline   . Spinal stenosis   . Neuropathy     Past Surgical History  Procedure Laterality Date  . Replacement total knee  11/2006    right knee  . Nasal sinus surgery  2009  . Total hip arthroplasty  09/2010  . Cardiac catheterization  08/2009    50% stenosis distal left main, 50% stenosis ostial left circumflex.   . Cardiac catheterization      at [facility]  . Eye surgery      IOL/ after cataracts removed- bilateral   . Aortic valve replacement  10/17/2011    Procedure: AORTIC VALVE REPLACEMENT (AVR);  Surgeon: Melrose Nakayama, MD;  Location: [place];  Service: Open Heart Surgery;  Laterality: N/A;  . Coronary artery bypass graft  10/17/2011    Procedure: CORONARY ARTERY BYPASS GRAFTING (CABG);  Surgeon: Melrose Nakayama, MD;  Location: [place];  Service: Open Heart Surgery;  Laterality: N/A;  Times Two, using right leg greater saphenous vein harvested endoscopically  . Permanent pacemaker insertion N/A 10/20/2011    Procedure: PERMANENT PACEMAKER INSERTION;  Surgeon: Thompson Grayer, MD;  Location: [facility] CATH LAB;  Service: Cardiovascular;  Laterality: N/A;    Social History  reports that she has never smoked. She has never used smokeless tobacco. She reports that she does not drink alcohol or use illicit drugs.  Family History family history includes Diabetes in her other; Heart disease in her  brother, brother, brother, and sister. There is no history of Anesthesia problems, Hypotension, Malignant hyperthermia, or Pseudochol deficiency.     Review of Systems  Constitutional: Positive for fatigue.  HENT: Negative.   Eyes: Negative.   Respiratory: Positive for wheezing.   Gastrointestinal: Negative.   Musculoskeletal: Positive for myalgias, back pain, arthralgias and gait problem.  Skin: Negative.   Neurological: Negative.   Psychiatric/Behavioral: Negative.   All other systems reviewed and are negative.  BP 114/75 mmHg  Pulse 82  Ht 5\' 4"  (1.626 m)  Wt 250 lb 8 oz (113.626 kg)  BMI 42.98 kg/m2  Physical Exam  Constitutional: She is oriented to person, place, and time. She appears well-developed and well-nourished.  Obese, presenting in a wheelchair  HENT:  Head: Normocephalic.  Nose: Nose normal.  Mouth/Throat: Oropharynx is clear and moist.  Eyes: Conjunctivae are normal. Pupils are equal, round, and reactive to light.  Neck: Normal range of motion. Neck supple. No JVD present.  Cardiovascular: Normal rate, regular rhythm, S1 normal, S2 normal and intact distal pulses.  Exam reveals no gallop and no friction rub.   Murmur heard.  Systolic murmur is present with a grade of 2/6  Well-healed midline incision  Pulmonary/Chest: Effort normal. No respiratory distress. She has wheezes. She has no rales. She exhibits no tenderness.  Abdominal: Soft. Bowel sounds are normal. She exhibits no distension. There is no tenderness.  Musculoskeletal: Normal range of motion. She exhibits no edema or tenderness.  Lymphadenopathy:    She has no cervical adenopathy.  Neurological: She is alert and oriented to person, place, and time. Coordination normal.  Skin: Skin is warm and dry. No rash noted. No erythema.  Psychiatric: She has a normal mood and affect. Her behavior is normal. Judgment and thought content normal.    Assessment and Plan  Nursing note and vitals  reviewed.

## 2015-07-06 NOTE — Assessment & Plan Note (Signed)
Currently with no symptoms of angina. No further testing

## 2015-07-06 NOTE — Assessment & Plan Note (Signed)
This is a chronic issue, likely from deconditioning, obesity Encouraged her to continue participating with PT and OT Recommended a water aerobics program

## 2015-07-06 NOTE — Assessment & Plan Note (Signed)
Blood pressure is well controlled on today's visit. No changes made to the medications. 

## 2015-07-06 NOTE — Assessment & Plan Note (Signed)
Echocardiogram has been ordered for chronic shortness of breath, fatigue. We will look at her porcine aortic valve. Last echo over 2 years ago

## 2015-07-06 NOTE — Patient Instructions (Addendum)
You are doing well. No medication changes were made.  We will schedule an echocardiogram for AVR, shortness of breath, fatigue, CABG  Please call us if you have new issues that need to be addressed before your next appt.  Your physician wants you to follow-up in: 6 months.  You will receive a reminder letter in the mail two months in advance. If you don't receive a letter, please call our office to schedule the follow-up appointment.

## 2015-07-10 ENCOUNTER — Encounter: Payer: Self-pay | Admitting: Emergency Medicine

## 2015-07-10 ENCOUNTER — Emergency Department: Payer: Medicare Other

## 2015-07-10 ENCOUNTER — Emergency Department
Admission: EM | Admit: 2015-07-10 | Discharge: 2015-07-10 | Disposition: A | Discharge status: Home / Self Care | Payer: Medicare Other | Attending: Emergency Medicine | Admitting: Emergency Medicine

## 2015-07-10 DIAGNOSIS — N3090 Cystitis, unspecified without hematuria: Secondary | ICD-10-CM | POA: Insufficient documentation

## 2015-07-10 DIAGNOSIS — E119 Type 2 diabetes mellitus without complications: Secondary | ICD-10-CM | POA: Diagnosis not present

## 2015-07-10 DIAGNOSIS — N950 Postmenopausal bleeding: Secondary | ICD-10-CM | POA: Insufficient documentation

## 2015-07-10 DIAGNOSIS — I10 Essential (primary) hypertension: Secondary | ICD-10-CM | POA: Diagnosis not present

## 2015-07-10 DIAGNOSIS — N939 Abnormal uterine and vaginal bleeding, unspecified: Secondary | ICD-10-CM | POA: Diagnosis present

## 2015-07-10 LAB — URINALYSIS COMPLETE WITH MICROSCOPIC (ARMC ONLY)
Bilirubin Urine: NEGATIVE
Glucose, UA: NEGATIVE mg/dL
Ketones, ur: NEGATIVE mg/dL
Nitrite: NEGATIVE
PROTEIN: 100 mg/dL — AB
Specific Gravity, Urine: 1.023 (ref 1.005–1.030)
Squamous Epithelial / LPF: NONE SEEN
TRANS EPITHEL UA: 1
pH: 6 (ref 5.0–8.0)

## 2015-07-10 MED ORDER — CEFTRIAXONE SODIUM 1 G IJ SOLR
1.0000 g | Freq: Once | INTRAMUSCULAR | Status: AC
  Administered 2015-07-10: 1 g via INTRAMUSCULAR
  Filled 2015-07-10: qty 10

## 2015-07-10 MED ORDER — PHENAZOPYRIDINE HCL 200 MG PO TABS
200.0000 mg | ORAL_TABLET | Freq: Once | ORAL | Status: AC
  Administered 2015-07-10: 200 mg via ORAL
  Filled 2015-07-10: qty 1

## 2015-07-10 MED ORDER — CEPHALEXIN 500 MG PO CAPS: 500.0000 mg | ORAL_CAPSULE | Freq: Four times a day (QID) | ORAL | Status: AC

## 2015-07-10 MED ORDER — PHENAZOPYRIDINE HCL 200 MG PO TABS: 200.0000 mg | ORAL_TABLET | Freq: Three times a day (TID) | ORAL | Status: AC | PRN

## 2015-07-10 NOTE — Discharge Instructions (Signed)

## 2015-07-10 NOTE — ED Notes (Signed)
Patient transported to Ultrasound 

## 2015-07-10 NOTE — ED Notes (Signed)
MD at bedside. 

## 2015-07-10 NOTE — ED Notes (Signed)
E[place] [facility] [place] ,  Light vaginal bleeding x1 day ,  Pt admits to severe burning on urination, with urgency to go "all the time "

## 2015-07-10 NOTE — ED Provider Notes (Signed)
[facility] Emergency Department Provider Note     Time seen: ----------------------------------------- 7:28 AM on 07/10/2015 -----------------------------------------    I have reviewed the triage vital signs and the nursing notes.   HISTORY  Chief Complaint Vaginal Bleeding    HPI Michele Schultz is a 81 y.o. female sent from [facility] [place] for what was thought to be light vaginal bleeding. Patient admits to severe burning on urination and she brought her own urine sample and that appears to be hematuria. She states she has urgency to go to the bathroom all the time, has not had a history of frequent urinary tract infections. Denies fevers chills or other complaints. Denies abdominal pain.   Past Medical History  Diagnosis Date  . Hypertension   . Anemia   . Neuralgia, neuritis, and radiculitis, unspecified   . Coronary artery disease     s/p CABG 11/12  . Aortic valve disorders     s/p AVR for severe AS 11/12  . Onychia and paronychia of toe   . Osteoarthrosis, unspecified whether generalized or localized, pelvic region and thigh     mainly in her back and knees  . Enthesopathy of hip region   . Esophageal reflux     followed by Dr.Seigal. stabilized with a combination of Nexium and Zantac  . Knee joint replacement by other means   . Stress incontinence, female     followed by Dr.Cope  . Atrioventricular block, complete   . Cardiac pacemaker -st Judes     11/12  . Chronic diastolic heart failure     ef 55%echo 11/12  . Type II or unspecified type diabetes mellitus without mention of complication, not stated as uncontrolled     pt. reports that she is borderline   . Spinal stenosis   . Neuropathy     Patient Active Problem List   Diagnosis Date Noted  . Chronic pain 06/17/2014  . Pain in the muscles 06/19/2013  . Chest pain 06/19/2013  . Wheezing 06/19/2013  . Generalized weakness 06/03/2013  . Dyspnea 01/27/2013  . Nausea  03/05/2012  . Constipation 03/05/2012  . Atrial fibrillation-non sustained 02/09/2012  . Cardiac pacemaker -st Judes   . Chronic diastolic heart failure   . Coronary artery disease   . Long term (current) use of anticoagulants 01/03/2012  . S/P CABG x 2 11/14/2011  . S/P aortic valve replacement with porcine valve 11/14/2011  . Back pain 11/14/2011  . Complete heart block 10/20/2011  . HTN (hypertension) 09/15/2011  . Spinal stenosis 03/22/2011  . Fatigue 03/22/2011  . Hyperlipidemia 03/22/2011    Past Surgical History  Procedure Laterality Date  . Replacement total knee  11/2006    right knee  . Nasal sinus surgery  2009  . Total hip arthroplasty  09/2010  . Cardiac catheterization  08/2009    50% stenosis distal left main, 50% stenosis ostial left circumflex.   . Cardiac catheterization      at [facility]  . Eye surgery      IOL/ after cataracts removed- bilateral   . Aortic valve replacement  10/17/2011    Procedure: AORTIC VALVE REPLACEMENT (AVR);  Surgeon: Melrose Nakayama, MD;  Location: [place];  Service: Open Heart Surgery;  Laterality: N/A;  . Coronary artery bypass graft  10/17/2011    Procedure: CORONARY ARTERY BYPASS GRAFTING (CABG);  Surgeon: Melrose Nakayama, MD;  Location: [place];  Service: Open Heart Surgery;  Laterality: N/A;  Times Two, using  right leg greater saphenous vein harvested endoscopically  . Permanent pacemaker insertion N/A 10/20/2011    Procedure: PERMANENT PACEMAKER INSERTION;  Surgeon: Thompson Grayer, MD;  Location: [facility] CATH LAB;  Service: Cardiovascular;  Laterality: N/A;    Allergies Citalopram; Cymbalta; Imipramine; and Proton pump inhibitors  Social History History  Substance Use Topics  . Smoking status: Never Smoker   . Smokeless tobacco: Never Used  . Alcohol Use: No    Review of Systems Constitutional: Negative for fever. Eyes: Negative for visual changes. ENT: Negative for sore throat. Cardiovascular: Negative for chest  pain. Respiratory: Negative for shortness of breath. Gastrointestinal: Negative for abdominal pain, vomiting and diarrhea. Genitourinary: Positive for dysuria and frequency Musculoskeletal: Negative for back pain. Skin: Negative for rash. Neurological: Negative for headaches, focal weakness or numbness.  10-point ROS otherwise negative.  ____________________________________________   PHYSICAL EXAM:  VITAL SIGNS: ED Triage Vitals  Enc Vitals Group     BP --      Pulse --      Resp --      Temp --      Temp src --      SpO2 --      Weight --      Height --      Head Cir --      Peak Flow --      Pain Score --      Pain Loc --      Pain Edu? --      Excl. in [place]? --     Constitutional: Alert and oriented. Well appearing and in no distress. Eyes: Conjunctivae are normal. PERRL. Normal extraocular movements. ENT   Head: Normocephalic and atraumatic.   Nose: No congestion/rhinnorhea.   Mouth/Throat: Mucous membranes are moist.   Neck: No stridor. Cardiovascular: Normal rate, regular rhythm. Normal and symmetric distal pulses are present in all extremities. No murmurs, rubs, or gallops. Respiratory: Normal respiratory effort without tachypnea nor retractions. Breath sounds are clear and equal bilaterally. No wheezes/rales/rhonchi. Gastrointestinal: Soft and nontender. No distention. No abdominal bruits.  Musculoskeletal: Nontender with normal range of motion in all extremities. No joint effusions.  No lower extremity tenderness nor edema. Neurologic:  Normal speech and language. No gross focal neurologic deficits are appreciated. Speech is normal. No gait instability. Skin:  Skin is warm, dry and intact. No rash noted. Psychiatric: Mood and affect are normal. Speech and behavior are normal. Patient exhibits appropriate insight and judgment. ____________________________________________  ED COURSE:  Pertinent labs & imaging results that were available during my  care of the patient were reviewed by me and considered in my medical decision making (see chart for details). We'll obtain a catheter UA, and reevaluate ____________________________________________    LABS (pertinent positives/negatives)  Labs Reviewed  URINALYSIS COMPLETEWITH MICROSCOPIC ([facility] ONLY) - Abnormal; Notable for the following:    Color, Urine YELLOW (*)    APPearance CLOUDY (*)    Hgb urine dipstick 3+ (*)    Protein, ur 100 (*)    Leukocytes, UA 3+ (*)    Bacteria, UA RARE (*)    All other components within normal limits  URINE CULTURE    IMPRESSION: 1. Significantly limited study. No have mass no pelvic fluid. Uterus and ovaries were not visualized.  ____________________________________________  FINAL ASSESSMENT AND PLAN  Cystitis  Plan: Patient with labs and imaging as dictated above. Bleeding is likely coming from the urine. Exam is benign at this time. We'll discharge with antibiotics and Pyridium. Advise  follow-up with her primary care doctor in 2 days for recheck.   Earleen Newport, MD   Earleen Newport, MD 07/10/15 [phone]

## 2015-07-12 ENCOUNTER — Telehealth: Payer: Self-pay | Admitting: Cardiovascular Disease

## 2015-07-12 NOTE — Telephone Encounter (Signed)
[facility] making house calls PCP Dr. [name] wants to know Is it ok to increase losartan to 100 mg from 50 mg PO daily and can she add beta blockers or spironolactone?  Please call Lauren [phone] .

## 2015-07-12 NOTE — Telephone Encounter (Signed)
Spoke w/ Ander Purpura.  Advised her of Dr. Donivan Scull recommendation. She verbalizes understanding and will have dr. Fara Olden call back w/ any questions or concerns.

## 2015-07-12 NOTE — Telephone Encounter (Signed)
Blood pressure was 110 systolic on last clinic visit last week If this is the blood pressure at home as well, would not increase losartan  Notes also indicate she is  also on a beta blocker, metoprolol Don't need to start a different one, would stay on what she is on Would not push the beta blocker higher given history of reactive airway disease  Given the age, would hold off and not start Aldactone. Don't need to run into potassium issues at her age

## 2015-07-13 LAB — URINE CULTURE: SPECIAL REQUESTS: NORMAL

## 2015-07-23 ENCOUNTER — Other Ambulatory Visit: Payer: Self-pay

## 2015-07-23 ENCOUNTER — Ambulatory Visit (INDEPENDENT_AMBULATORY_CARE_PROVIDER_SITE_OTHER): Payer: Medicare Other

## 2015-07-23 DIAGNOSIS — R0602 Shortness of breath: Secondary | ICD-10-CM

## 2015-07-23 DIAGNOSIS — Z954 Presence of other heart-valve replacement: Secondary | ICD-10-CM

## 2015-07-23 DIAGNOSIS — Z951 Presence of aortocoronary bypass graft: Secondary | ICD-10-CM

## 2015-07-23 DIAGNOSIS — Z952 Presence of prosthetic heart valve: Secondary | ICD-10-CM

## 2015-07-23 DIAGNOSIS — I4891 Unspecified atrial fibrillation: Secondary | ICD-10-CM | POA: Diagnosis not present

## 2015-07-26 ENCOUNTER — Other Ambulatory Visit: Payer: Self-pay

## 2015-07-26 MED ORDER — FUROSEMIDE 20 MG PO TABS: 20.0000 mg | ORAL_TABLET | ORAL | Status: DC

## 2015-08-25 ENCOUNTER — Encounter: Payer: Self-pay | Admitting: Physician Assistant

## 2015-08-25 ENCOUNTER — Telehealth: Payer: Self-pay | Admitting: *Deleted

## 2015-08-25 ENCOUNTER — Ambulatory Visit (INDEPENDENT_AMBULATORY_CARE_PROVIDER_SITE_OTHER): Payer: Medicare Other | Admitting: Physician Assistant

## 2015-08-25 VITALS — BP 122/74 | HR 79 | Ht 64.0 in | Wt 251.5 lb

## 2015-08-25 DIAGNOSIS — Z953 Presence of xenogenic heart valve: Secondary | ICD-10-CM

## 2015-08-25 DIAGNOSIS — I442 Atrioventricular block, complete: Secondary | ICD-10-CM

## 2015-08-25 DIAGNOSIS — Z951 Presence of aortocoronary bypass graft: Secondary | ICD-10-CM

## 2015-08-25 DIAGNOSIS — I5032 Chronic diastolic (congestive) heart failure: Secondary | ICD-10-CM

## 2015-08-25 DIAGNOSIS — I25810 Atherosclerosis of coronary artery bypass graft(s) without angina pectoris: Secondary | ICD-10-CM | POA: Diagnosis not present

## 2015-08-25 DIAGNOSIS — R5382 Chronic fatigue, unspecified: Secondary | ICD-10-CM | POA: Diagnosis not present

## 2015-08-25 DIAGNOSIS — R079 Chest pain, unspecified: Secondary | ICD-10-CM | POA: Diagnosis not present

## 2015-08-25 DIAGNOSIS — Z950 Presence of cardiac pacemaker: Secondary | ICD-10-CM

## 2015-08-25 NOTE — Progress Notes (Signed)
Cardiology Office Note:  Date of Encounter: 08/25/2015  ID: Michele Schultz, DOB 1931-01-03, MRN [id number]  PCP:  [name] Primary Cardiologist:  Dr. Rockey Situ, MD  Chief Complaint  Patient presents with  . other    C/o chest pain, edema and pacemaker going off.  Pt has questions about having someone check her device from home, please advise. Meds reviewed verbally with pt.    HPI:  81 year old female with a history of CAD s/p 2v CABG (SVG-LAD and SVG-OM1) in 10/2011 and residual mid left main and ostial LCx disease, severe aortic stenosis s/p Magna Ease pericardial tissue valve size 21 mm replacement in 67/8938 complicated by postoperative complete heart block requiring [employer] PPM, chronic diastolic CHF, chronic SOB, RAD, diabetes, HTN, HLD, obesity, chronic fatigue, severe back pain, spinal stenosis, and fibromyalgia who presents to clinic today with complaints of chest pain, edema, and SOB.   She has not had any ischemic evaluations since her bypass surgery. She reports that she has fatigue all the time and sleeps all of time. These are not new symptoms for her. She presents in a wheelchair, which also is not new for her. She has been working with PT and OT, but these were stopped 6 days prior after she developed chest pain until she could be seen by cardiology. No regular exercise program. Weight continues to be an issue, though stable. She was previously taking a pain pill every 4 hours, though her PCP is cutting her back and she is somewhat upset about this as this has led to increased diffuse pain.   She reports developing chest pain lasting 30 minutes after eating dinner 6 days prior with associated SOB after walking back to her room using the stairs. She then she "has not felt like herself." She has not had any further chest pain or SOB, just "not felt like herself." She has felt more tired and is sleeping more over the past 1 week. She denies any palpitations,  diaphoresis, nausea, vomiting, presyncope, or syncope. She reports "they are just over reacting." She is asymptomatic at this time. She would like to get her PPM interrogated in the near future.      Past Medical History  Diagnosis Date  . Hypertension   . Anemia   . Neuralgia, neuritis, and radiculitis, unspecified   . Coronary artery disease     s/p CABG 11/12  . Aortic valve disorders     s/p AVR for severe AS 11/12  . Onychia and paronychia of toe   . Osteoarthrosis, unspecified whether generalized or localized, pelvic region and thigh     mainly in her back and knees  . Enthesopathy of hip region   . Esophageal reflux     followed by Dr.Seigal. stabilized with a combination of Nexium and Zantac  . Knee joint replacement by other means   . Stress incontinence, female     followed by Dr.Cope  . Atrioventricular block, complete   . Cardiac pacemaker -st Judes     11/12  . Chronic diastolic heart failure     ef 55%echo 11/12  . Type II or unspecified type diabetes mellitus without mention of complication, not stated as uncontrolled     pt. reports that she is borderline   . Spinal stenosis   . Neuropathy   :  Past Surgical History  Procedure Laterality Date  . Replacement total knee  11/2006    right knee  . Nasal sinus surgery  2009  . Total hip arthroplasty  09/2010  . Cardiac catheterization  08/2009    50% stenosis distal left main, 50% stenosis ostial left circumflex.   . Cardiac catheterization      at [facility]  . Eye surgery      IOL/ after cataracts removed- bilateral   . Aortic valve replacement  10/17/2011    Procedure: AORTIC VALVE REPLACEMENT (AVR);  Surgeon: Melrose Nakayama, MD;  Location: [place];  Service: Open Heart Surgery;  Laterality: N/A;  . Coronary artery bypass graft  10/17/2011    Procedure: CORONARY ARTERY BYPASS GRAFTING (CABG);  Surgeon: Melrose Nakayama, MD;  Location: [facility];  Service: Open Heart Surgery;  Laterality: N/A;  Times Two,  using right leg greater saphenous vein harvested endoscopically  . Permanent pacemaker insertion N/A 10/20/2011    Procedure: PERMANENT PACEMAKER INSERTION;  Surgeon: Thompson Grayer, MD;  Location: [facility] CATH LAB;  Service: Cardiovascular;  Laterality: N/A;  :  Social History:  The patient  reports that she has never smoked. She has never used smokeless tobacco. She reports that she does not drink alcohol or use illicit drugs.   Family History  Problem Relation Age of Onset  . Diabetes Other   . Heart disease Sister   . Heart disease Brother   . Heart disease Brother   . Heart disease Brother   . Anesthesia problems Neg Hx   . Hypotension Neg Hx   . Malignant hyperthermia Neg Hx   . Pseudochol deficiency Neg Hx      Allergies:  Allergies  Allergen Reactions  . Citalopram     Altered mental status  . Cymbalta [Duloxetine Hcl]     Caused sedation  . Imipramine   . Proton Pump Inhibitors      Home Medications:  Current Outpatient Prescriptions  Medication Sig Dispense Refill  . albuterol-ipratropium (COMBIVENT) 18-103 MCG/ACT inhaler Inhale 2 puffs into the lungs every 6 (six) hours as needed for wheezing. 1 Inhaler 4  . amLODipine (NORVASC) 5 MG tablet Take 1 tablet (5 mg total) by mouth daily. 30 tablet 6  . aspirin 81 MG tablet Take 81 mg by mouth daily.      Marland Kitchen azelastine (ASTELIN) 137 MCG/SPRAY nasal spray Place 1 spray into the nose 2 (two) times daily. Use in each nostril as directed    . benzonatate (TESSALON) 100 MG capsule Take 200 mg by mouth 3 (three) times daily as needed.    . Cyanocobalamin (VITAMIN B-12) 1000 MCG/15ML LIQD Inject 1,000 mcg as directed every 30 (thirty) days.      . cycloSPORINE (RESTASIS) 0.05 % ophthalmic emulsion Place 1 drop into both eyes 2 (two) times daily.     . furosemide (LASIX) 20 MG tablet Take 1 tablet (20 mg total) by mouth every other day. 30 tablet 6  . gabapentin (NEURONTIN) 100 MG capsule Take 200 mg by mouth at bedtime.    Marland Kitchen  HYDROcodone-acetaminophen (NORCO) 10-325 MG per tablet Take 1 tablet by mouth every 4 (four) hours as needed.    Marland Kitchen losartan (COZAAR) 25 MG tablet Take 1 tablet (25 mg total) by mouth daily. 25 tablet 90  . magnesium hydroxide (MILK OF MAGNESIA) 400 MG/5ML suspension Take by mouth daily.     . metoprolol succinate (TOPROL-XL) 50 MG 24 hr tablet TAKE 1 TABLET DAILY (8AM) 30 tablet 6  . nitroGLYCERIN (NITROSTAT) 0.4 MG SL tablet Place 1 tablet (0.4 mg total) under the tongue every 5 (five) minutes  as needed for chest pain. 90 tablet 3  . pantoprazole (PROTONIX) 40 MG tablet Take 40 mg by mouth 2 (two) times daily.      . phenazopyridine (PYRIDIUM) 200 MG tablet Take 1 tablet (200 mg total) by mouth 3 (three) times daily as needed for pain. 20 tablet 0  . potassium chloride (K-DUR) 10 MEQ tablet Take 1 tablet (10 mEq total) by mouth daily. 90 tablet 3  . promethazine (PHENERGAN) 25 MG tablet Take 1 tablet (25 mg total) by mouth every 6 (six) hours as needed for nausea or vomiting. 30 tablet 0   No current facility-administered medications for this visit.     Review of Systems:  Review of Systems  Constitutional: Positive for malaise/fatigue. Negative for fever, chills, weight loss and diaphoresis.  HENT: Negative for congestion.   Eyes: Negative for discharge and redness.  Respiratory: Positive for shortness of breath. Negative for cough, hemoptysis, sputum production and wheezing.   Cardiovascular: Positive for chest pain and leg swelling. Negative for palpitations, orthopnea, claudication and PND.  Gastrointestinal: Negative for heartburn, nausea and vomiting.  Musculoskeletal: Positive for myalgias, back pain, joint pain and neck pain. Negative for falls.  Skin: Negative for rash.  Neurological: Positive for weakness. Negative for dizziness, sensory change, speech change and focal weakness.  Endo/Heme/Allergies: Does not bruise/bleed easily.  Psychiatric/Behavioral: The patient is  nervous/anxious.      Physical Exam:  Blood pressure 122/74, pulse 79, height 5\' 4"  (1.626 m), weight 251 lb 8 oz (114.08 kg). BMI: Body mass index is 43.15 kg/(m^2). General: Pleasant, NAD. Psych: Normal affect. Responds to questions with normal affect.  Neuro: Alert and oriented X 3. Moves all extremities spontaneously. HEENT: Normocephalic, atraumatic. EOM intact. Sclera anicteric.  Neck: Trachea midline. Supple without bruits or JVD. Lungs:  Respirations regular and unlabored. CTA bilaterally without wheezing, crackles, or rhonchi.  Heart: RRR, normal s3, s4. II/VI systolic murmurs. No rubs, or gallops.  Abdomen: Obese, soft, non-tender, non-distended, BS + x 4.  Extremities: No clubbing, cyanosis or edema. DP/PT/Radials 2+ and equal bilaterally.   Accessory Clinical Findings:  EKG: NSR, 79 bpm, right axis deviation, low voltage in precordial leads, poor R wave progression, possible old anterior infarct, TWI lateral leads  Recent Labs: 12/04/2014: BUN 8; Creatinine 0.82; HGB 11.9*; Platelet 278; Potassium 3.3*; SGPT (ALT) 18; Sodium 140  No results found for requested labs within last 365 days.  CrCl cannot be calculated (Patient has no serum creatinine result on file.).  Weights: Wt Readings from Last 3 Encounters:  08/25/15 251 lb 8 oz (114.08 kg)  07/10/15 230 lb (104.327 kg)  07/06/15 250 lb 8 oz (113.626 kg)    Other studies Reviewed: Additional studies/ records that were reviewed today include: prior office notes.  Assessment & Plan:  1. Chest pain/CAD s/p CAD x 2: -Schedule Lexiscan Myoview to evaluate for high risk ischemia  -Recent echo as above with normal LV function, normal wall motion, aortic valve functioning well  -Currently without symptoms -Has SL NTG, has not needed  2. History of complete heart block s/p [employer] Jude PPM: -Appointment scheduled for 9/29 to have device interrogated by [facility]. Jude rep, last interrogation was 09/30/2014 -Re-establish with  Dr. Olin Pia, MD  3. Fatigue/SOB: -This is a chronic issue for her -Lexiscan as above -Possibly from deconditioning, obesity, fibromyalgia  -If Lexiscan is normal encourage her to restart PT/OT  4. Mild diastolic CHF: -Continue home dose Lasix of 20 mg every other day with KCl  10 meq -Continue Toprol XL 50 mg daily, losartan 25 mg daily   4. Status post aortic valve replacement: -Recent echo 07/2015 well functioning   5. HTN: -Continue amlodipine 5 mg daily and medications as above -Controlled   6. Obesity: -PT/OT as above   Dispo: -Follow up with Dr. Olin Pia, MD -Follow up with Dr. Rockey Situ, MD in 2 weeks  Current medicines are reviewed at length with the patient today.  The patient did not have any concerns regarding medicines.   Christell Faith, PA-C [place], [place] [address] [phone] [facility] 08/25/2015, 3:16 PM

## 2015-08-25 NOTE — Telephone Encounter (Signed)
Per Octavia Heir  Pt will be coming back 09/02/15 at 945 for [employer] interrogation

## 2015-08-25 NOTE — Patient Instructions (Signed)
Medication Instructions:  Your physician recommends that you continue on your current medications as directed. Please refer to the Current Medication list given to you today.    Labwork: Your physician recommends that you have labs today: BMET, TSH, CBC   Testing/Procedures: Your physician has requested that you have a lexiscan myoview. For further information please visit [email or web address]. Please follow instruction sheet, as given.  [place]  Your caregiver has ordered a Stress Test with nuclear imaging. The purpose of this test is to evaluate the blood supply to your heart muscle. This procedure is referred to as a "Non-Invasive Stress Test." This is because other than having an IV started in your vein, nothing is inserted or "invades" your body. Cardiac stress tests are done to find areas of poor blood flow to the heart by determining the extent of coronary artery disease (CAD). Some patients exercise on a treadmill, which naturally increases the blood flow to your heart, while others who are  unable to walk on a treadmill due to physical limitations have a pharmacologic/chemical stress agent called Lexiscan . This medicine will mimic walking on a treadmill by temporarily increasing your coronary blood flow.   Please note: these test may take anywhere between 2-4 hours to complete  PLEASE REPORT TO [facility] WILL DIRECT YOU WHERE TO GO  Date of Procedure: Sept 29, 11am Arrival Time for Procedure: 10:30am  Instructions regarding medication:     __xx__:  Hold metoprolol night before procedure and morning of procedure  __xx__:  Hold other medications as follows:_lasix the morning of your test  ______________________________________________________________________________________________________________________________________________________________________________________________________________________________________________________________________________  PLEASE NOTIFY THE OFFICE AT LEAST 24 HOURS IN ADVANCE IF YOU ARE UNABLE TO KEEP YOUR APPOINTMENT.  [phone] AND  PLEASE NOTIFY NUCLEAR MEDICINE AT [facility] AT LEAST 24 HOURS IN ADVANCE IF YOU ARE UNABLE TO KEEP YOUR APPOINTMENT. [phone]  How to prepare for your Myoview test:  1. Do not eat or drink after midnight 2. No caffeine for 24 hours prior to test 3. No smoking 24 hours prior to test. 4. Your medication may be taken with water.  If your doctor stopped a medication because of this test, do not take that medication. 5. Ladies, please do not wear dresses.  Skirts or pants are appropriate. Please wear a short sleeve shirt. 6. No perfume, cologne or lotion. 7. Wear comfortable walking shoes. No heels!            Follow-Up: Your physician recommends that you schedule a follow-up appointment with Dr. Caryl Comes Your physician recommends that you schedule a follow-up appointment with Christell Faith, PA-C or Dr. Rockey Situ in two weeks    Any Other Special Instructions Will Be Listed Below (If Applicable). Device interrogation with [employer] rep on September 29, 9:45am in our office

## 2015-08-26 LAB — CBC
HEMOGLOBIN: 11.3 g/dL (ref 11.1–15.9)
Hematocrit: 36.4 % (ref 34.0–46.6)
MCH: 25.8 pg — ABNORMAL LOW (ref 26.6–33.0)
MCHC: 31 g/dL — AB (ref 31.5–35.7)
MCV: 83 fL (ref 79–97)
Platelets: 304 10*3/uL (ref 150–379)
RBC: 4.38 x10E6/uL (ref 3.77–5.28)
RDW: 17.4 % — ABNORMAL HIGH (ref 12.3–15.4)
WBC: 6.6 10*3/uL (ref 3.4–10.8)

## 2015-08-26 LAB — BASIC METABOLIC PANEL
BUN/Creatinine Ratio: 18 (ref 11–26)
BUN: 12 mg/dL (ref 8–27)
CHLORIDE: 99 mmol/L (ref 97–108)
CO2: 24 mmol/L (ref 18–29)
Calcium: 9.2 mg/dL (ref 8.7–10.3)
Creatinine, Ser: 0.68 mg/dL (ref 0.57–1.00)
GFR calc Af Amer: 94 mL/min/{1.73_m2} (ref >59)
GFR calc non Af Amer: 82 mL/min/{1.73_m2} (ref >59)
GLUCOSE: 125 mg/dL — AB (ref 65–99)
Potassium: 4.5 mmol/L (ref 3.5–5.2)
Sodium: 138 mmol/L (ref 134–144)

## 2015-08-26 LAB — TSH: TSH: 1.05 u[IU]/mL (ref 0.450–4.500)

## 2015-09-01 ENCOUNTER — Telehealth: Payer: Self-pay

## 2015-09-01 NOTE — Telephone Encounter (Signed)
S/w Lorie Phenix Jude rep, to confirm appt tomorrow at 9:45pm. Gaspar Bidding confirmed.

## 2015-09-01 NOTE — Telephone Encounter (Signed)
S/w pt to review lexi instructions. Pt verbalized understanding and states she will be in our office tomorrow at 9:45am for device interrogation then 10:30 [place]. Pt had no further questions.

## 2015-09-02 ENCOUNTER — Ambulatory Visit
Admission: RE | Admit: 2015-09-02 | Discharge: 2015-09-02 | Disposition: A | Discharge status: Home / Self Care | Payer: Medicare Other | Source: Ambulatory Visit | Attending: Physician Assistant | Admitting: Physician Assistant

## 2015-09-02 ENCOUNTER — Telehealth: Payer: Self-pay

## 2015-09-02 DIAGNOSIS — R079 Chest pain, unspecified: Secondary | ICD-10-CM | POA: Diagnosis not present

## 2015-09-02 LAB — NM MYOCAR MULTI W/SPECT W/WALL MOTION / EF
CHL CUP NUCLEAR SDS: 3
CHL CUP NUCLEAR SSS: 5
CSEPHR: 61 %
CSEPPHR: 85 {beats}/min
LV sys vol: 36 mL
LVDIAVOL: 72 mL
Rest HR: 67 {beats}/min
SRS: 3
TID: 0.93

## 2015-09-02 MED ORDER — TECHNETIUM TC 99M SESTAMIBI - CARDIOLITE
32.5100 | Freq: Once | INTRAVENOUS | Status: AC | PRN
  Administered 2015-09-02: 32.51 via INTRAVENOUS

## 2015-09-02 MED ORDER — TECHNETIUM TC 99M SESTAMIBI - CARDIOLITE
10.0000 | Freq: Once | INTRAVENOUS | Status: AC | PRN
  Administered 2015-09-02: 12.4 via INTRAVENOUS

## 2015-09-02 MED ORDER — REGADENOSON 0.4 MG/5ML IV SOLN
0.4000 mg | Freq: Once | INTRAVENOUS | Status: AC
  Administered 2015-09-02: 0.4 mg via INTRAVENOUS

## 2015-09-02 NOTE — Telephone Encounter (Signed)
Pacemaker interrogation by Aaron Edelman with [employer]. Report to be scanned. No changes made. Forward to [employer], PA-C who requested interrogation

## 2015-09-06 ENCOUNTER — Encounter: Payer: Self-pay | Admitting: Physician Assistant

## 2015-09-06 DIAGNOSIS — E785 Hyperlipidemia, unspecified: Secondary | ICD-10-CM | POA: Insufficient documentation

## 2015-09-06 DIAGNOSIS — J45909 Unspecified asthma, uncomplicated: Secondary | ICD-10-CM | POA: Insufficient documentation

## 2015-09-06 DIAGNOSIS — M797 Fibromyalgia: Secondary | ICD-10-CM | POA: Insufficient documentation

## 2015-09-06 DIAGNOSIS — I350 Nonrheumatic aortic (valve) stenosis: Secondary | ICD-10-CM | POA: Insufficient documentation

## 2015-09-09 ENCOUNTER — Ambulatory Visit: Payer: Medicare Other | Admitting: Physician Assistant

## 2015-09-10 NOTE — Telephone Encounter (Signed)
This encounter was created in error - please disregard.

## 2015-09-13 ENCOUNTER — Inpatient Hospital Stay
Admission: EM | Admit: 2015-09-13 | Discharge: 2015-09-18 | DRG: 690 | Disposition: A | Discharge status: Skilled Nursing Facility | Payer: Medicare Other | Attending: Internal Medicine | Admitting: Internal Medicine

## 2015-09-13 ENCOUNTER — Emergency Department: Payer: Medicare Other

## 2015-09-13 DIAGNOSIS — K219 Gastro-esophageal reflux disease without esophagitis: Secondary | ICD-10-CM | POA: Diagnosis present

## 2015-09-13 DIAGNOSIS — I350 Nonrheumatic aortic (valve) stenosis: Secondary | ICD-10-CM | POA: Diagnosis present

## 2015-09-13 DIAGNOSIS — Z888 Allergy status to other drugs, medicaments and biological substances status: Secondary | ICD-10-CM

## 2015-09-13 DIAGNOSIS — Z951 Presence of aortocoronary bypass graft: Secondary | ICD-10-CM

## 2015-09-13 DIAGNOSIS — M1610 Unilateral primary osteoarthritis, unspecified hip: Secondary | ICD-10-CM | POA: Diagnosis present

## 2015-09-13 DIAGNOSIS — B9620 Unspecified Escherichia coli [E. coli] as the cause of diseases classified elsewhere: Secondary | ICD-10-CM | POA: Diagnosis present

## 2015-09-13 DIAGNOSIS — M797 Fibromyalgia: Secondary | ICD-10-CM | POA: Diagnosis present

## 2015-09-13 DIAGNOSIS — Z9842 Cataract extraction status, left eye: Secondary | ICD-10-CM

## 2015-09-13 DIAGNOSIS — M4800 Spinal stenosis, site unspecified: Secondary | ICD-10-CM | POA: Diagnosis present

## 2015-09-13 DIAGNOSIS — R079 Chest pain, unspecified: Secondary | ICD-10-CM | POA: Diagnosis present

## 2015-09-13 DIAGNOSIS — J45909 Unspecified asthma, uncomplicated: Secondary | ICD-10-CM | POA: Diagnosis present

## 2015-09-13 DIAGNOSIS — E119 Type 2 diabetes mellitus without complications: Secondary | ICD-10-CM | POA: Diagnosis present

## 2015-09-13 DIAGNOSIS — Z833 Family history of diabetes mellitus: Secondary | ICD-10-CM

## 2015-09-13 DIAGNOSIS — I110 Hypertensive heart disease with heart failure: Secondary | ICD-10-CM | POA: Diagnosis present

## 2015-09-13 DIAGNOSIS — Z6841 Body Mass Index (BMI) 40.0 and over, adult: Secondary | ICD-10-CM

## 2015-09-13 DIAGNOSIS — N390 Urinary tract infection, site not specified: Principal | ICD-10-CM | POA: Diagnosis present

## 2015-09-13 DIAGNOSIS — Z7982 Long term (current) use of aspirin: Secondary | ICD-10-CM

## 2015-09-13 DIAGNOSIS — Z96649 Presence of unspecified artificial hip joint: Secondary | ICD-10-CM | POA: Diagnosis present

## 2015-09-13 DIAGNOSIS — R531 Weakness: Secondary | ICD-10-CM

## 2015-09-13 DIAGNOSIS — R296 Repeated falls: Secondary | ICD-10-CM | POA: Diagnosis present

## 2015-09-13 DIAGNOSIS — I2510 Atherosclerotic heart disease of native coronary artery without angina pectoris: Secondary | ICD-10-CM | POA: Diagnosis present

## 2015-09-13 DIAGNOSIS — Z8249 Family history of ischemic heart disease and other diseases of the circulatory system: Secondary | ICD-10-CM

## 2015-09-13 DIAGNOSIS — Z87440 Personal history of urinary (tract) infections: Secondary | ICD-10-CM

## 2015-09-13 DIAGNOSIS — G894 Chronic pain syndrome: Secondary | ICD-10-CM | POA: Diagnosis present

## 2015-09-13 DIAGNOSIS — E785 Hyperlipidemia, unspecified: Secondary | ICD-10-CM | POA: Diagnosis present

## 2015-09-13 DIAGNOSIS — I639 Cerebral infarction, unspecified: Secondary | ICD-10-CM

## 2015-09-13 DIAGNOSIS — Z950 Presence of cardiac pacemaker: Secondary | ICD-10-CM

## 2015-09-13 DIAGNOSIS — Z96651 Presence of right artificial knee joint: Secondary | ICD-10-CM | POA: Diagnosis present

## 2015-09-13 DIAGNOSIS — I5032 Chronic diastolic (congestive) heart failure: Secondary | ICD-10-CM | POA: Diagnosis present

## 2015-09-13 DIAGNOSIS — Z952 Presence of prosthetic heart valve: Secondary | ICD-10-CM

## 2015-09-13 DIAGNOSIS — G629 Polyneuropathy, unspecified: Secondary | ICD-10-CM | POA: Diagnosis present

## 2015-09-13 DIAGNOSIS — Z9841 Cataract extraction status, right eye: Secondary | ICD-10-CM

## 2015-09-13 DIAGNOSIS — Z79899 Other long term (current) drug therapy: Secondary | ICD-10-CM

## 2015-09-13 LAB — CBC
HCT: 35.9 % (ref 35.0–47.0)
Hemoglobin: 11.4 g/dL — ABNORMAL LOW (ref 12.0–16.0)
MCH: 25.9 pg — ABNORMAL LOW (ref 26.0–34.0)
MCHC: 31.9 g/dL — ABNORMAL LOW (ref 32.0–36.0)
MCV: 81.2 fL (ref 80.0–100.0)
Platelets: 241 10*3/uL (ref 150–440)
RBC: 4.43 MIL/uL (ref 3.80–5.20)
RDW: 18.2 % — ABNORMAL HIGH (ref 11.5–14.5)
WBC: 6.7 10*3/uL (ref 3.6–11.0)

## 2015-09-13 LAB — GLUCOSE, CAPILLARY
GLUCOSE-CAPILLARY: 83 mg/dL (ref 65–99)
Glucose-Capillary: 127 mg/dL — ABNORMAL HIGH (ref 65–99)

## 2015-09-13 LAB — BASIC METABOLIC PANEL
Anion gap: 5 (ref 5–15)
BUN: 16 mg/dL (ref 6–20)
CO2: 31 mmol/L (ref 22–32)
Calcium: 8.9 mg/dL (ref 8.9–10.3)
Chloride: 102 mmol/L (ref 101–111)
Creatinine, Ser: 0.67 mg/dL (ref 0.44–1.00)
GFR calc Af Amer: >60 mL/min (ref >60)
GFR calc non Af Amer: >60 mL/min (ref >60)
Glucose, Bld: 105 mg/dL — ABNORMAL HIGH (ref 65–99)
Potassium: 3.9 mmol/L (ref 3.5–5.1)
Sodium: 138 mmol/L (ref 135–145)

## 2015-09-13 LAB — TROPONIN I: Troponin I: <0.03 ng/mL (ref <0.031)

## 2015-09-13 MED ORDER — PANTOPRAZOLE SODIUM 40 MG PO TBEC
40.0000 mg | DELAYED_RELEASE_TABLET | Freq: Two times a day (BID) | ORAL | Status: DC
  Administered 2015-09-13 – 2015-09-18 (×10): 40 mg via ORAL
  Filled 2015-09-13 (×10): qty 1

## 2015-09-13 MED ORDER — NITROGLYCERIN 0.4 MG SL SUBL: 0.4000 mg | SUBLINGUAL_TABLET | SUBLINGUAL | Status: DC | PRN

## 2015-09-13 MED ORDER — POTASSIUM CHLORIDE ER 10 MEQ PO TBCR
10.0000 meq | EXTENDED_RELEASE_TABLET | Freq: Every day | ORAL | Status: DC
  Administered 2015-09-13 – 2015-09-18 (×6): 10 meq via ORAL
  Filled 2015-09-13 (×12): qty 1

## 2015-09-13 MED ORDER — HYDROCODONE-ACETAMINOPHEN 10-325 MG PO TABS
1.0000 | ORAL_TABLET | ORAL | Status: DC | PRN
  Administered 2015-09-15 – 2015-09-18 (×8): 1 via ORAL
  Filled 2015-09-13 (×12): qty 1

## 2015-09-13 MED ORDER — PHENAZOPYRIDINE HCL 200 MG PO TABS
200.0000 mg | ORAL_TABLET | Freq: Three times a day (TID) | ORAL | Status: DC
  Administered 2015-09-13 – 2015-09-16 (×8): 200 mg via ORAL
  Filled 2015-09-13 (×10): qty 1

## 2015-09-13 MED ORDER — LOSARTAN POTASSIUM 25 MG PO TABS
25.0000 mg | ORAL_TABLET | Freq: Every day | ORAL | Status: DC
  Administered 2015-09-14 – 2015-09-18 (×5): 25 mg via ORAL
  Filled 2015-09-13 (×5): qty 1

## 2015-09-13 MED ORDER — METOPROLOL SUCCINATE ER 50 MG PO TB24
50.0000 mg | ORAL_TABLET | Freq: Every day | ORAL | Status: DC
  Administered 2015-09-14 – 2015-09-18 (×5): 50 mg via ORAL
  Filled 2015-09-13 (×5): qty 1

## 2015-09-13 MED ORDER — PROMETHAZINE HCL 25 MG PO TABS: 25.0000 mg | ORAL_TABLET | Freq: Four times a day (QID) | ORAL | Status: DC | PRN

## 2015-09-13 MED ORDER — ENOXAPARIN SODIUM 40 MG/0.4ML ~~LOC~~ SOLN
40.0000 mg | Freq: Two times a day (BID) | SUBCUTANEOUS | Status: DC
  Administered 2015-09-13 – 2015-09-18 (×10): 40 mg via SUBCUTANEOUS
  Filled 2015-09-13 (×10): qty 0.4

## 2015-09-13 MED ORDER — ONDANSETRON HCL 4 MG/2ML IJ SOLN: 4.0000 mg | Freq: Four times a day (QID) | INTRAMUSCULAR | Status: DC | PRN

## 2015-09-13 MED ORDER — HYDROCODONE-ACETAMINOPHEN 10-325 MG PO TABS
1.0000 | ORAL_TABLET | ORAL | Status: DC | PRN
  Administered 2015-09-13 – 2015-09-14 (×4): 1 via ORAL

## 2015-09-13 MED ORDER — BENZONATATE 100 MG PO CAPS: 200.0000 mg | ORAL_CAPSULE | Freq: Three times a day (TID) | ORAL | Status: DC | PRN

## 2015-09-13 MED ORDER — ONDANSETRON HCL 4 MG PO TABS: 4.0000 mg | ORAL_TABLET | Freq: Four times a day (QID) | ORAL | Status: DC | PRN

## 2015-09-13 MED ORDER — ACETAMINOPHEN 650 MG RE SUPP: 650.0000 mg | Freq: Four times a day (QID) | RECTAL | Status: DC | PRN

## 2015-09-13 MED ORDER — FUROSEMIDE 20 MG PO TABS
20.0000 mg | ORAL_TABLET | ORAL | Status: DC
  Administered 2015-09-15 – 2015-09-17 (×2): 20 mg via ORAL
  Filled 2015-09-13 (×2): qty 1

## 2015-09-13 MED ORDER — CYCLOSPORINE 0.05 % OP EMUL
1.0000 [drp] | Freq: Two times a day (BID) | OPHTHALMIC | Status: DC
  Administered 2015-09-13 – 2015-09-18 (×9): 1 [drp] via OPHTHALMIC
  Filled 2015-09-13 (×13): qty 1

## 2015-09-13 MED ORDER — SODIUM CHLORIDE 0.9 % IV SOLN
INTRAVENOUS | Status: DC
  Administered 2015-09-13: 22:00:00 via INTRAVENOUS

## 2015-09-13 MED ORDER — IPRATROPIUM-ALBUTEROL 0.5-2.5 (3) MG/3ML IN SOLN: 3.0000 mL | Freq: Four times a day (QID) | RESPIRATORY_TRACT | Status: DC | PRN

## 2015-09-13 MED ORDER — GABAPENTIN 100 MG PO CAPS
200.0000 mg | ORAL_CAPSULE | Freq: Every day | ORAL | Status: DC
  Administered 2015-09-13 – 2015-09-17 (×5): 200 mg via ORAL
  Filled 2015-09-13 (×5): qty 2

## 2015-09-13 MED ORDER — AMLODIPINE BESYLATE 5 MG PO TABS
5.0000 mg | ORAL_TABLET | Freq: Every day | ORAL | Status: DC
  Administered 2015-09-14 – 2015-09-18 (×5): 5 mg via ORAL
  Filled 2015-09-13 (×5): qty 1

## 2015-09-13 MED ORDER — INSULIN ASPART 100 UNIT/ML ~~LOC~~ SOLN
0.0000 [IU] | Freq: Three times a day (TID) | SUBCUTANEOUS | Status: DC
  Administered 2015-09-16: 2 [IU] via SUBCUTANEOUS
  Administered 2015-09-17 – 2015-09-18 (×2): 1 [IU] via SUBCUTANEOUS
  Filled 2015-09-13: qty 2
  Filled 2015-09-13 (×2): qty 1

## 2015-09-13 MED ORDER — ASPIRIN EC 81 MG PO TBEC
81.0000 mg | DELAYED_RELEASE_TABLET | Freq: Every day | ORAL | Status: DC
  Administered 2015-09-14 – 2015-09-18 (×5): 81 mg via ORAL
  Filled 2015-09-13 (×5): qty 1

## 2015-09-13 MED ORDER — SODIUM CHLORIDE 0.9 % IJ SOLN
3.0000 mL | Freq: Two times a day (BID) | INTRAMUSCULAR | Status: DC
  Administered 2015-09-14 – 2015-09-18 (×8): 3 mL via INTRAVENOUS

## 2015-09-13 MED ORDER — MAGNESIUM HYDROXIDE 400 MG/5ML PO SUSP
30.0000 mL | Freq: Every day | ORAL | Status: DC | PRN
  Administered 2015-09-15: 30 mL via ORAL
  Filled 2015-09-13 (×2): qty 30

## 2015-09-13 MED ORDER — AZELASTINE HCL 0.1 % NA SOLN
1.0000 | Freq: Two times a day (BID) | NASAL | Status: DC
  Administered 2015-09-13 – 2015-09-17 (×5): 1 via NASAL
  Filled 2015-09-13 (×2): qty 30

## 2015-09-13 MED ORDER — CYANOCOBALAMIN 1000 MCG/ML IJ SOLN
1000.0000 ug | INTRAMUSCULAR | Status: DC
  Filled 2015-09-13: qty 1

## 2015-09-13 MED ORDER — ACETAMINOPHEN 325 MG PO TABS
650.0000 mg | ORAL_TABLET | Freq: Four times a day (QID) | ORAL | Status: DC | PRN
  Filled 2015-09-13: qty 2

## 2015-09-13 NOTE — ED Notes (Signed)
Pt bib EMS from home w/ c/o generalized weakness.  Per EMS, pt has fallen 3 times in last 2 wks.  Pt sts that she "can't use her legs".  Pt does not appear distressed at this time.

## 2015-09-13 NOTE — ED Provider Notes (Signed)
[facility] Emergency Department Provider Note  ____________________________________________  Time seen: Approximately 1:20 PM  I have reviewed the triage vital signs and the nursing notes.   HISTORY  Chief Complaint Weakness    HPI Michele Schultz is a 81 y.o. female with a history of hypertension and CABG who is presenting today with generalized weakness over the past 1-2 weeks. She says that every time she stands up she has weakness in her legs and has multiple falls at this time. She says that she has hit her head but has not lost consciousness. He is also complaining of midline lumbar pain. Denies any nausea or vomiting. Says that she has had some burning with urination with increased frequency. No dizziness or lightheadedness.At her baseline the patient walks with a walker but even with this has been unsteady. Denies any loss of bowel or bladder continence.  Prior EMS, who brought her in, she was very unsteady during transfer to the stretcher.   Past Medical History  Diagnosis Date  . Hypertension   . Anemia   . Neuralgia, neuritis, and radiculitis, unspecified   . Coronary artery disease     a. s/p 2v CABG 11/12 (VG-LAD, VG-OM1); b. Lexiscan 08/2015: normal study  . Aortic stenosis, severe     a. s/p Magna Ease pericardial tissue valve size 21 mm replacement in 10/2011 for severe AS 11/12; b. echo 07/2015: EF 55-60% mod concentric LVH, GR1DD, LA mildly dilated, PASP 45 mm Hg  . Onychia and paronychia of toe   . Osteoarthrosis, unspecified whether generalized or localized, pelvic region and thigh     mainly in her back and knees  . Enthesopathy of hip region   . Esophageal reflux     followed by Dr.Seigal. stabilized with a combination of Nexium and Zantac  . Knee joint replacement by other means   . Stress incontinence, female     followed by Dr.Cope  . Complete heart block [facility])     a. s/p St Jude PPM 10/2011; b. SN # [id number]  . Cardiac  pacemaker -st Judes     11/12  . Chronic diastolic heart failure ([place])     a. echo 2014: EF 55-60%, no RWMA, GR1DD, PASP 47 mm Hg; b. echo 2012L EF 55-60%, GR1DD, mild MR  . Type II or unspecified type diabetes mellitus without mention of complication, not stated as uncontrolled     pt. reports that she is borderline   . Spinal stenosis   . Neuropathy ([place])   . Morbid obesity ([place])   . RAD (reactive airway disease)     a. chronic SOB  . Fibromyalgia   . HLD (hyperlipidemia)     Patient Active Problem List   Diagnosis Date Noted  . HLD (hyperlipidemia)   . Fibromyalgia   . RAD (reactive airway disease)   . Morbid obesity ([place])   . Aortic stenosis, severe   . Chronic pain 06/17/2014  . Pain in the muscles 06/19/2013  . Chest pain 06/19/2013  . Wheezing 06/19/2013  . Generalized weakness 06/03/2013  . Dyspnea 01/27/2013  . Nausea 03/05/2012  . Constipation 03/05/2012  . Atrial fibrillation-non sustained 02/09/2012  . Cardiac pacemaker -st Judes   . Chronic diastolic heart failure ([place])   . Coronary artery disease   . Long term (current) use of anticoagulants 01/03/2012  . S/P CABG x 2 11/14/2011  . S/P aortic valve replacement with porcine valve 11/14/2011  . Back pain 11/14/2011  . Complete  heart block ([place]) 10/20/2011  . HTN (hypertension) 09/15/2011  . Spinal stenosis 03/22/2011  . Fatigue 03/22/2011  . Hyperlipidemia 03/22/2011    Past Surgical History  Procedure Laterality Date  . Replacement total knee  11/2006    right knee  . Nasal sinus surgery  2009  . Total hip arthroplasty  09/2010  . Cardiac catheterization  08/2009    50% stenosis distal left main, 50% stenosis ostial left circumflex.   . Cardiac catheterization      at [facility]  . Eye surgery      IOL/ after cataracts removed- bilateral   . Aortic valve replacement  10/17/2011    Procedure: AORTIC VALVE REPLACEMENT (AVR);  Surgeon: Melrose Nakayama, MD;  Location: [place];  Service: Open Heart  Surgery;  Laterality: N/A;  . Coronary artery bypass graft  10/17/2011    Procedure: CORONARY ARTERY BYPASS GRAFTING (CABG);  Surgeon: Melrose Nakayama, MD;  Location: [place];  Service: Open Heart Surgery;  Laterality: N/A;  Times Two, using right leg greater saphenous vein harvested endoscopically  . Permanent pacemaker insertion N/A 10/20/2011    Procedure: PERMANENT PACEMAKER INSERTION;  Surgeon: Thompson Grayer, MD;  Location: [facility] CATH LAB;  Service: Cardiovascular;  Laterality: N/A;    Current Outpatient Rx  Name  Route  Sig  Dispense  Refill  . albuterol-ipratropium (COMBIVENT) 18-103 MCG/ACT inhaler   Inhalation   Inhale 2 puffs into the lungs every 6 (six) hours as needed for wheezing.   1 Inhaler   4   . amLODipine (NORVASC) 5 MG tablet   Oral   Take 1 tablet (5 mg total) by mouth daily.   30 tablet   6   . aspirin 81 MG tablet   Oral   Take 81 mg by mouth daily.           Marland Kitchen azelastine (ASTELIN) 137 MCG/SPRAY nasal spray   Nasal   Place 1 spray into the nose 2 (two) times daily. Use in each nostril as directed         . benzonatate (TESSALON) 100 MG capsule   Oral   Take 200 mg by mouth 3 (three) times daily as needed.         . Cyanocobalamin (VITAMIN B-12) 1000 MCG/15ML LIQD   Injection   Inject 1,000 mcg as directed every 30 (thirty) days.           . cycloSPORINE (RESTASIS) 0.05 % ophthalmic emulsion   Both Eyes   Place 1 drop into both eyes 2 (two) times daily.          . furosemide (LASIX) 20 MG tablet   Oral   Take 1 tablet (20 mg total) by mouth every other day.   30 tablet   6   . gabapentin (NEURONTIN) 100 MG capsule   Oral   Take 200 mg by mouth at bedtime.         Marland Kitchen HYDROcodone-acetaminophen (NORCO) 10-325 MG per tablet   Oral   Take 1 tablet by mouth every 4 (four) hours as needed.         Marland Kitchen EXPIRED: losartan (COZAAR) 25 MG tablet   Oral   Take 1 tablet (25 mg total) by mouth daily.   25 tablet   90   . magnesium  hydroxide (MILK OF MAGNESIA) 400 MG/5ML suspension   Oral   Take by mouth daily.          . metoprolol succinate (TOPROL-XL) 50  MG 24 hr tablet      TAKE 1 TABLET DAILY (8AM)   30 tablet   6   . nitroGLYCERIN (NITROSTAT) 0.4 MG SL tablet   Sublingual   Place 1 tablet (0.4 mg total) under the tongue every 5 (five) minutes as needed for chest pain.   90 tablet   3   . pantoprazole (PROTONIX) 40 MG tablet   Oral   Take 40 mg by mouth 2 (two) times daily.           . phenazopyridine (PYRIDIUM) 200 MG tablet   Oral   Take 1 tablet (200 mg total) by mouth 3 (three) times daily as needed for pain.   20 tablet   0   . EXPIRED: potassium chloride (K-DUR) 10 MEQ tablet   Oral   Take 1 tablet (10 mEq total) by mouth daily.   90 tablet   3   . promethazine (PHENERGAN) 25 MG tablet   Oral   Take 1 tablet (25 mg total) by mouth every 6 (six) hours as needed for nausea or vomiting.   30 tablet   0     Allergies Citalopram; Cymbalta; Imipramine; and Proton pump inhibitors  Family History  Problem Relation Age of Onset  . Diabetes Other   . Heart disease Sister   . Heart disease Brother   . Heart disease Brother   . Heart disease Brother   . Anesthesia problems Neg Hx   . Hypotension Neg Hx   . Malignant hyperthermia Neg Hx   . Pseudochol deficiency Neg Hx     Social History Social History  Substance Use Topics  . Smoking status: Never Smoker   . Smokeless tobacco: Never Used  . Alcohol Use: No    Review of Systems Constitutional: No fever/chills Eyes: No visual changes. ENT: No sore throat. Cardiovascular: Denies chest pain. Respiratory: Denies shortness of breath. Gastrointestinal: No abdominal pain.  No nausea, no vomiting.  No diarrhea.  No constipation. Genitourinary: As above Musculoskeletal: As above  Skin: Negative for rash. Neurological: Negative for headaches, or numbness.  10-point ROS otherwise  negative.  ____________________________________________   PHYSICAL EXAM:  VITAL SIGNS: ED Triage Vitals  Enc Vitals Group     BP 09/13/15 1255 112/44 mmHg     Pulse Rate 09/13/15 1255 75     Resp 09/13/15 1255 16     Temp 09/13/15 1255 98 F (36.7 C)     Temp Source 09/13/15 1255 Oral     SpO2 09/13/15 1255 97 %     Weight 09/13/15 1255 240 lb (108.863 kg)     Height 09/13/15 1255 5\' 4"  (1.626 m)     Head Cir --      Peak Flow --      Pain Score 09/13/15 1256 4     Pain Loc --      Pain Edu? --      Excl. in [place]? --     Constitutional: Alert and oriented. Well appearing and in no acute distress. Eyes: Conjunctivae are normal. PERRL. EOMI. Head: Atraumatic. Nose: No congestion/rhinnorhea. Mouth/Throat: Mucous membranes are moist.  Oropharynx non-erythematous. Neck: No stridor.   Cardiovascular: Normal rate, regular rhythm. Grossly normal heart sounds.  Good peripheral circulation. Respiratory: Normal respiratory effort.  No retractions. Lungs CTAB. Gastrointestinal: Soft and nontender. No distention. No abdominal bruits. No CVA tenderness. Musculoskeletal: No lower extremity tenderness nor edema.  No joint effusions. No tenderness or step-off to the midline T or  L spines. No saddle anesthesia.  Neurologic:  Normal speech and language. Bilateral lower extremities with 3 out of 5 strength. Also with 3 out of 5 strength to the right upper extremity. 4-5 strength the left upper extremity. Skin:  Skin is warm, dry and intact. No rash noted. Psychiatric: Mood and affect are normal. Speech and behavior are normal.  ____________________________________________   LABS (all labs ordered are listed, but only abnormal results are displayed)  Labs Reviewed  BASIC METABOLIC PANEL - Abnormal; Notable for the following:    Glucose, Bld 105 (*)    All other components within normal limits  CBC - Abnormal; Notable for the following:    Hemoglobin 11.4 (*)    MCH 25.9 (*)    MCHC 31.9  (*)    RDW 18.2 (*)    All other components within normal limits  TROPONIN I  URINALYSIS COMPLETEWITH MICROSCOPIC ([facility] ONLY)  CBG MONITORING, ED   ____________________________________________  EKG  ED ECG REPORT I, Doran Stabler, the attending physician, personally viewed and interpreted this ECG.   Date: 09/13/2015  EKG Time: 1313  Rate: 70  Rhythm: normal sinus rhythm  Axis: Normal axis  Intervals:none  ST&T Change: No abnormal ST elevation or depression. T-wave inversions in 1, aVL, V2 and V3. T-wave inversions in V2 and V3 are new from previous. ____________________________________________  RADIOLOGY  CT of the brain without any acute abnormality.  Chest x-ray without any acute pathology. I personally reviewed this film. Lumbar film and pelvis without any acute pathology.  ____________________________________________   PROCEDURES    ____________________________________________   INITIAL IMPRESSION / ASSESSMENT AND PLAN / ED COURSE  Pertinent labs & imaging results that were available during my care of the patient were reviewed by me and considered in my medical decision making (see chart for details).  ----------------------------------------- 3:31 PM on 09/13/2015 -----------------------------------------  Patient with diffuse weakness as well as chest pain with a history of coronary artery disease. We'll admit to the hospital for further workup. Explained the plan to the patient and she is willing to comply. Signed out to Dr. Posey Pronto.  Patient has an implanted pacemaker 5 unable to do an MRI to further workup the back. I did discuss the lower extremity. Dr. Posey Pronto as well as the back pain and he is aware of my line of thinking for spinal pathology. ____________________________________________   FINAL CLINICAL IMPRESSION(S) / ED DIAGNOSES  Acute generalized weakness. Ambulatory dysfunction.    Orbie Pyo, MD 09/13/15 [phone]

## 2015-09-13 NOTE — Progress Notes (Signed)
Lovenox dosing increased to 40mg  SubQ twice daily due to patient BMI >40 per [facility] anticoagulation protocol.  Norma Fredrickson, PharmD Clinical Pharmacist 09/13/2015

## 2015-09-13 NOTE — Progress Notes (Signed)
Spoke with Beth in pharmacy and sent note regarding missing medications: cyclosporine, pyridium, and azelastine.  States medication will be sent up.  Lynnda Shields, RN

## 2015-09-13 NOTE — H&P (Signed)
[facility] [place] NAME: Michele Schultz    MR#:  [id number]  DATE OF BIRTH:  12-17-1930  DATE OF ADMISSION:  09/13/2015  PRIMARY CARE PHYSICIAN: [name]   REQUESTING/REFERRING PHYSICIAN: Dr. Pixie Casino  CHIEF COMPLAINT:   Chief Complaint  Patient presents with  . Weakness    HISTORY OF PRESENT ILLNESS: Michele Schultz  is a 81 y.o. female with a known history of chronic pain, hypertension, coronary artery disease, aortic stenosis severe, GERD, recurrent urinary tract infection, status post pacemaker, diabetes type 2, chronic pain syndrome, morbid obesity who was presenting to the emergency room complaining of generalized weakness as well as ongoing right-sided chest pain. Patient states that she is so weak that she is unable to walk. She had a CT scan done in the emergency room which was negative. She also complains of sharp right-sided chest pain. She did have a recent stress test that was negative. She otherwise denies any nausea vomiting or diarrhea denies any urinary symptoms. PAST MEDICAL HISTORY:   Past Medical History  Diagnosis Date  . Hypertension   . Anemia   . Neuralgia, neuritis, and radiculitis, unspecified   . Coronary artery disease     a. s/p 2v CABG 11/12 (VG-LAD, VG-OM1); b. Lexiscan 08/2015: normal study  . Aortic stenosis, severe     a. s/p Magna Ease pericardial tissue valve size 21 mm replacement in 10/2011 for severe AS 11/12; b. echo 07/2015: EF 55-60% mod concentric LVH, GR1DD, LA mildly dilated, PASP 45 mm Hg  . Onychia and paronychia of toe   . Osteoarthrosis, unspecified whether generalized or localized, pelvic region and thigh     mainly in her back and knees  . Enthesopathy of hip region   . Esophageal reflux     followed by Dr.Seigal. stabilized with a combination of Nexium and Zantac  . Knee joint replacement by other means   . Stress incontinence, female     followed by Dr.Cope  .  Complete heart block [facility])     a. s/p St Jude PPM 10/2011; b. SN # [id number]  . Cardiac pacemaker -st Judes     11/12  . Chronic diastolic heart failure ([place])     a. echo 2014: EF 55-60%, no RWMA, GR1DD, PASP 47 mm Hg; b. echo 2012L EF 55-60%, GR1DD, mild MR  . Type II or unspecified type diabetes mellitus without mention of complication, not stated as uncontrolled     pt. reports that she is borderline   . Spinal stenosis   . Neuropathy ([place])   . Morbid obesity ([place])   . RAD (reactive airway disease)     a. chronic SOB  . Fibromyalgia   . HLD (hyperlipidemia)     PAST SURGICAL HISTORY:  Past Surgical History  Procedure Laterality Date  . Replacement total knee  11/2006    right knee  . Nasal sinus surgery  2009  . Total hip arthroplasty  09/2010  . Cardiac catheterization  08/2009    50% stenosis distal left main, 50% stenosis ostial left circumflex.   . Cardiac catheterization      at [facility]  . Eye surgery      IOL/ after cataracts removed- bilateral   . Aortic valve replacement  10/17/2011    Procedure: AORTIC VALVE REPLACEMENT (AVR);  Surgeon: Melrose Nakayama, MD;  Location: [place];  Service: Open Heart Surgery;  Laterality: N/A;  . Coronary artery bypass graft  10/17/2011    Procedure: CORONARY ARTERY BYPASS GRAFTING (CABG);  Surgeon: Melrose Nakayama, MD;  Location: [place];  Service: Open Heart Surgery;  Laterality: N/A;  Times Two, using right leg greater saphenous vein harvested endoscopically  . Permanent pacemaker insertion N/A 10/20/2011    Procedure: PERMANENT PACEMAKER INSERTION;  Surgeon: Thompson Grayer, MD;  Location:  [facility];  Service: Cardiovascular;  Laterality: N/A;    SOCIAL HISTORY:  Social History  Substance Use Topics  . Smoking status: Never Smoker   . Smokeless tobacco: Never Used  . Alcohol Use: No    FAMILY HISTORY:  Family History  Problem Relation Age of Onset  . Diabetes Other   . Heart disease Sister   . Heart disease Brother   .  Heart disease Brother   . Heart disease Brother   . Anesthesia problems Neg Hx   . Hypotension Neg Hx   . Malignant hyperthermia Neg Hx   . Pseudochol deficiency Neg Hx     DRUG ALLERGIES:  Allergies  Allergen Reactions  . Citalopram Other (See Comments)    Reaction:  Altered mental status   . Cymbalta [Duloxetine Hcl] Other (See Comments)    Reaction:  Sedative for pt   . Imipramine Other (See Comments)    Reaction:  Unknown   . Proton Pump Inhibitors Other (See Comments)    Reaction:  Unknown     REVIEW OF SYSTEMS:   CONSTITUTIONAL: No fever, positive fatigue and weakness.  EYES: No blurred or double vision.  EARS, NOSE, AND THROAT: No tinnitus or ear pain.  RESPIRATORY: No cough, shortness of breath, wheezing or hemoptysis.  CARDIOVASCULAR: Right-sided chest pain, orthopnea, edema.  GASTROINTESTINAL: No nausea, vomiting, diarrhea or abdominal pain.  GENITOURINARY: No dysuria, hematuria.  ENDOCRINE: No polyuria, nocturia,  HEMATOLOGY: No anemia, easy bruising or bleeding SKIN: No rash or lesion. MUSCULOSKELETAL: No joint pain or arthritis.   NEUROLOGIC: No tingling, numbness, weakness.  PSYCHIATRY: No anxiety or depression.   MEDICATIONS AT HOME:  Prior to Admission medications   Medication Sig Start Date End Date Taking? Authorizing Provider  albuterol-ipratropium (COMBIVENT) 18-103 MCG/ACT inhaler Inhale 2 puffs into the lungs every 6 (six) hours as needed for wheezing. 06/19/13  Yes Minna Merritts, MD  amLODipine (NORVASC) 5 MG tablet Take 1 tablet (5 mg total) by mouth daily. 09/15/11  Yes Minna Merritts, MD  aspirin EC 81 MG tablet Take 81 mg by mouth daily.   Yes Historical Provider, MD  azelastine (ASTELIN) 0.1 % nasal spray Place 1 spray into both nostrils 2 (two) times daily.   Yes Historical Provider, MD  benzonatate (TESSALON) 100 MG capsule Take 200 mg by mouth 3 (three) times daily as needed for cough.    Yes Historical Provider, MD  cyanocobalamin  (,VITAMIN B-12,) 1000 MCG/ML injection Inject 1,000 mcg into the muscle every 30 (thirty) days.   Yes Historical Provider, MD  cycloSPORINE (RESTASIS) 0.05 % ophthalmic emulsion Place 1 drop into both eyes 2 (two) times daily.    Yes Historical Provider, MD  furosemide (LASIX) 20 MG tablet Take 1 tablet (20 mg total) by mouth every other day. 07/26/15  Yes Minna Merritts, MD  gabapentin (NEURONTIN) 100 MG capsule Take 200 mg by mouth at bedtime.   Yes Minna Merritts, MD  HYDROcodone-acetaminophen (NORCO) 10-325 MG per tablet Take 1 tablet by mouth every 4 (four) hours as needed for moderate pain.    Yes Historical Provider, MD  losartan (COZAAR)  25 MG tablet Take 25 mg by mouth daily.   Yes Historical Provider, MD  magnesium hydroxide (MILK OF MAGNESIA) 400 MG/5ML suspension Take 30 mLs by mouth daily as needed for mild constipation.    Yes Historical Provider, MD  metoprolol succinate (TOPROL-XL) 50 MG 24 hr tablet Take 50 mg by mouth daily.   Yes Historical Provider, MD  nitroGLYCERIN (NITROSTAT) 0.4 MG SL tablet Place 1 tablet (0.4 mg total) under the tongue every 5 (five) minutes as needed for chest pain. 01/24/13  Yes Minna Merritts, MD  pantoprazole (PROTONIX) 40 MG tablet Take 40 mg by mouth 2 (two) times daily.     Yes Historical Provider, MD  phenazopyridine (PYRIDIUM) 200 MG tablet Take 1 tablet (200 mg total) by mouth 3 (three) times daily as needed for pain. 07/10/15 07/09/16 Yes Earleen Newport, MD  potassium chloride (K-DUR) 10 MEQ tablet Take 10 mEq by mouth daily.   Yes Historical Provider, MD  promethazine (PHENERGAN) 25 MG tablet Take 1 tablet (25 mg total) by mouth every 6 (six) hours as needed for nausea or vomiting. 06/17/14  Yes Minna Merritts, MD      PHYSICAL EXAMINATION:   VITAL SIGNS: Blood pressure 115/54, pulse 73, temperature 98 F (36.7 C), temperature source Oral, resp. rate 20, height 5\' 4"  (1.626 m), weight 108.863 kg (240 lb), SpO2 95 %.  GENERAL:  81  y.o.-year-old patient lying in the bed with no acute distress.  EYES: Pupils equal, round, reactive to light and accommodation. No scleral icterus. Extraocular muscles intact.  HEENT: Head atraumatic, normocephalic. Oropharynx and nasopharynx clear.  NECK:  Supple, no jugular venous distention. No thyroid enlargement, no tenderness.  LUNGS: Normal breath sounds bilaterally, no wheezing, rales,rhonchi or crepitation. No use of accessory muscles of respiration.  CARDIOVASCULAR: S1, S2 normal. No murmurs, rubs, or gallops.  ABDOMEN: Soft, nontender, nondistended. Bowel sounds present. No organomegaly or mass.  EXTREMITIES: No pedal edema, cyanosis, or clubbing.  NEUROLOGIC: Cranial nerves II through XII are intact. Muscle strength 4 /5 lower extremities, reflexes 2+ PSYCHIATRIC: The patient is alert and oriented x 3.  SKIN: No obvious rash, lesion, or ulcer.   LABORATORY PANEL:   CBC  Recent Labs Lab 09/13/15 1319  WBC 6.7  HGB 11.4*  HCT 35.9  PLT 241  MCV 81.2  MCH 25.9*  MCHC 31.9*  RDW 18.2*   ------------------------------------------------------------------------------------------------------------------  Chemistries   Recent Labs Lab 09/13/15 1319  NA 138  K 3.9  CL 102  CO2 31  GLUCOSE 105*  BUN 16  CREATININE 0.67  CALCIUM 8.9   ------------------------------------------------------------------------------------------------------------------ estimated creatinine clearance is 65.4 mL/min (by C-G formula based on Cr of 0.67). ------------------------------------------------------------------------------------------------------------------ No results for input(s): TSH, T4TOTAL, T3FREE, THYROIDAB in the last 72 hours.  Invalid input(s): FREET3   Coagulation profile No results for input(s): INR, PROTIME in the last 168 hours. ------------------------------------------------------------------------------------------------------------------- No results for  input(s): DDIMER in the last 72 hours. -------------------------------------------------------------------------------------------------------------------  Cardiac Enzymes  Recent Labs Lab 09/13/15 1319  TROPONINI <0.03   ------------------------------------------------------------------------------------------------------------------ Invalid input(s): POCBNP  ---------------------------------------------------------------------------------------------------------------  Urinalysis    Component Value Date/Time   COLORURINE YELLOW* 07/10/2015 0945   COLORURINE Yellow 12/04/2014 1618   APPEARANCEUR CLOUDY* 07/10/2015 0945   APPEARANCEUR Clear 12/04/2014 1618   LABSPEC 1.023 07/10/2015 0945   LABSPEC 1.006 12/04/2014 1618   PHURINE 6.0 07/10/2015 0945   PHURINE 7.0 12/04/2014 1618   GLUCOSEU NEGATIVE 07/10/2015 0945   GLUCOSEU Negative 12/04/2014 1618   HGBUR 3+*  07/10/2015 0945   HGBUR Negative 12/04/2014 1618   BILIRUBINUR NEGATIVE 07/10/2015 0945   BILIRUBINUR Negative 12/04/2014 1618   KETONESUR NEGATIVE 07/10/2015 0945   KETONESUR Negative 12/04/2014 1618   PROTEINUR 100* 07/10/2015 0945   PROTEINUR Negative 12/04/2014 1618   UROBILINOGEN 0.2 10/16/2011 0950   NITRITE NEGATIVE 07/10/2015 0945   NITRITE Negative 12/04/2014 1618   LEUKOCYTESUR 3+* 07/10/2015 0945   LEUKOCYTESUR Trace 12/04/2014 1618     RADIOLOGY: Dg Chest 1 View  09/13/2015   CLINICAL DATA:  Weakness. Four falls in the last 2 weeks. Low back pain.  EXAM: CHEST 1 VIEW  COMPARISON:  06/16/2013  FINDINGS: Dual lead pacer noted, lead positioning unchanged. Prior CABG. Accounting for the AP projection, there is mild enlargement of the cardiopericardial silhouette.  Tortuous thoracic aorta, stable.  No edema.  The lungs appear clear.  IMPRESSION: 1. Mild enlargement of the cardiopericardial silhouette, without edema. 2. Prior CABG.  Dual lead pacer remains in place. 3. Stable tortuosity of the thoracic  aorta.   Electronically Signed   By: Van Clines M.D.   On: 09/13/2015 14:02   Dg Lumbar Spine Complete  09/13/2015   CLINICAL DATA:  Generalized weakness. For falls in the last 2 weeks. Low back pain.  EXAM: LUMBAR SPINE - COMPLETE 4+ VIEW  COMPARISON:  CT scan from 10/16/2014  FINDINGS: Bony demineralization. Mild sclerosis along the iliac side of both sacroiliac joints. Right hip implant. Degenerative spurring of the left femoral head.  Right facet arthropathy at L4-5 and L5-S1.  Aortoiliac atherosclerotic vascular disease.  3-4 mm of grade 1 degenerative anterolisthesis at L3-4 and L4-5, without pars defects observed. This is similar to the prior CT scan.  No fracture or new subluxation is identified.  IMPRESSION: 1. Reduced sensitivity due to bony demineralization. 2. No fracture or acute subluxation is identified. There is 3-4 mm of grade 1 degenerative anterolisthesis at L3-4 and L4-5. 3. Lower lumbar facet arthropathy. 4. Mild sclerosis along the iliac side of both sacroiliac joints. 5. Mild degenerative spurring of the left femoral head.   Electronically Signed   By: Van Clines M.D.   On: 09/13/2015 14:05   Dg Pelvis 1-2 Views  09/13/2015   CLINICAL DATA:  Fall x4 in the last 2 weeks. Bilateral hip pain and low back pain. Generalized weakness.  EXAM: PELVIS - 1-2 VIEW  COMPARISON:  CT abdomen and pelvis, 10/16/2014  FINDINGS: No acute fracture. Right hip prosthesis appears well seated and aligned. Arthropathic changes of the right hip with mild concentric hip joint space narrowing and marginal osteophytes from the femoral head are stable. Bones are demineralized. SI joints and symphysis pubis are normally aligned. Iliac artery vascular calcifications are noted in the upper pelvis.  IMPRESSION: No fracture or acute finding.   Electronically Signed   By: Lajean Manes M.D.   On: 09/13/2015 14:07   Ct Head Wo Contrast  09/13/2015   CLINICAL DATA:  Generalized weakness.  Multiple  recent falls.  EXAM: CT HEAD WITHOUT CONTRAST  TECHNIQUE: Contiguous axial images were obtained from the base of the skull through the vertex without intravenous contrast.  COMPARISON:  10/23/2014 head CT.  FINDINGS: No evidence of parenchymal hemorrhage or extra-axial fluid collection. No mass lesion, mass effect, or midline shift.  No CT evidence of acute infarction. There is intracranial atherosclerotic arterial calcification. Nonspecific stable moderate subcortical and periventricular white matter hypodensity, most in keeping with chronic small vessel ischemic change.  There is stable diffuse  cerebral volume loss. Stable ventricles, the size of which are concordant with the degree of cerebral volume loss.  The visualized paranasal sinuses are essentially clear. The mastoid air cells are unopacified. No evidence of calvarial fracture.  IMPRESSION: 1. No acute intracranial abnormality.  No calvarial fracture. 2. Intracranial atherosclerosis, diffuse cerebral volume loss and nonspecific chronic small vessel ischemic white matter change, stable.   Electronically Signed   By: Ilona Sorrel M.D.   On: 09/13/2015 14:21    EKG: Orders placed or performed during the hospital encounter of 09/13/15  . ED EKG  . ED EKG  . EKG 12-Lead  . EKG 12-Lead    IMPRESSION AND PLAN: Patient is 81 year old African-American female presenting with generalized weakness and difficulty with ambulation  1. Generalized weakness and difficulty with ambulation unclear etiology at this time will admit the patient give her some fluids, obtain physical therapy evaluation check urinalysis due to history of recurrent UTIs   2. Right-sided chest pain atypical have her cardiologist evaluate recent stress test negative  3. Hypertension continue Norvasc, Toprol-XL  4. Coronary artery disease associated with severe aortic stenosis continue Toprol-XL  5. Diabetes type II  place her on sliding scale insulin   All the records are  reviewed and case discussed with ED provider. Management plans discussed with the patient, family and they are in agreement.  CODE STATUS: Advance Directive Documentation        Most Recent Value   Type of Advance Directive  Healthcare Power of Attorney, Out of facility DNR (pink MOST or yellow form)   Pre-existing out of facility DNR order (yellow form or pink MOST form)     "MOST" Form in Place?         TOTAL TIME TAKING CARE OF THIS PATIENT: 55 minutes.    Dustin Flock M.D on 09/13/2015 at 3:44 PM  Between 7am to 6pm - Pager - [phone]  After 6pm go to [email or web address] - password [email or web address] [facility]  Office  [phone]  CC: Primary care physician; [name]

## 2015-09-13 NOTE — ED Notes (Signed)
Attempted to call report.  RN in isolation room and will call once she is out.

## 2015-09-14 LAB — CBC
HCT: 34.3 % — ABNORMAL LOW (ref 35.0–47.0)
HEMOGLOBIN: 11.2 g/dL — AB (ref 12.0–16.0)
MCH: 27.2 pg (ref 26.0–34.0)
MCHC: 32.7 g/dL (ref 32.0–36.0)
MCV: 83.2 fL (ref 80.0–100.0)
Platelets: 240 10*3/uL (ref 150–440)
RBC: 4.12 MIL/uL (ref 3.80–5.20)
RDW: 17.8 % — ABNORMAL HIGH (ref 11.5–14.5)
WBC: 6.6 10*3/uL (ref 3.6–11.0)

## 2015-09-14 LAB — URINALYSIS COMPLETE WITH MICROSCOPIC (ARMC ONLY)
Bilirubin Urine: NEGATIVE
Glucose, UA: NEGATIVE mg/dL
Ketones, ur: NEGATIVE mg/dL
Nitrite: POSITIVE — AB
Protein, ur: 30 mg/dL — AB
Specific Gravity, Urine: 1.015 (ref 1.005–1.030)
pH: 6 (ref 5.0–8.0)

## 2015-09-14 LAB — MRSA PCR SCREENING: MRSA by PCR: NEGATIVE

## 2015-09-14 LAB — BASIC METABOLIC PANEL
Anion gap: 7 (ref 5–15)
BUN: 12 mg/dL (ref 6–20)
CALCIUM: 8.7 mg/dL — AB (ref 8.9–10.3)
CO2: 25 mmol/L (ref 22–32)
Chloride: 104 mmol/L (ref 101–111)
Creatinine, Ser: 0.64 mg/dL (ref 0.44–1.00)
Glucose, Bld: 132 mg/dL — ABNORMAL HIGH (ref 65–99)
POTASSIUM: 3.9 mmol/L (ref 3.5–5.1)
SODIUM: 136 mmol/L (ref 135–145)

## 2015-09-14 LAB — GLUCOSE, CAPILLARY
GLUCOSE-CAPILLARY: 128 mg/dL — AB (ref 65–99)
GLUCOSE-CAPILLARY: 142 mg/dL — AB (ref 65–99)
Glucose-Capillary: 126 mg/dL — ABNORMAL HIGH (ref 65–99)
Glucose-Capillary: 149 mg/dL — ABNORMAL HIGH (ref 65–99)

## 2015-09-14 LAB — TROPONIN I

## 2015-09-14 MED ORDER — PNEUMOCOCCAL VAC POLYVALENT 25 MCG/0.5ML IJ INJ: 0.5000 mL | INJECTION | INTRAMUSCULAR | Status: DC

## 2015-09-14 MED ORDER — DEXTROSE 5 % IV SOLN
1.0000 g | INTRAVENOUS | Status: DC
  Administered 2015-09-14 – 2015-09-18 (×5): 1 g via INTRAVENOUS
  Filled 2015-09-14 (×6): qty 10

## 2015-09-14 MED ORDER — INFLUENZA VAC SPLIT QUAD 0.5 ML IM SUSY: 0.5000 mL | PREFILLED_SYRINGE | INTRAMUSCULAR | Status: DC

## 2015-09-14 NOTE — Progress Notes (Signed)
[facility] [place] NAME: Michele Schultz    MR#:  [id number]  DATE OF BIRTH:  [birth year]  SUBJECTIVE:  CHIEF COMPLAINT:   Chief Complaint  Patient presents with  . Weakness   Patient here with generalized weakness and frequent falls. Denies any chest pain, shortness of breath, nausea, vomiting. Still feels quite weak.  Noted to have a UTI and urinalysis.  REVIEW OF SYSTEMS:    Review of Systems  Constitutional: Negative for fever and chills.  HENT: Negative for congestion and tinnitus.   Eyes: Negative for blurred vision and double vision.  Respiratory: Negative for cough, shortness of breath and wheezing.   Cardiovascular: Negative for chest pain, orthopnea and PND.  Gastrointestinal: Negative for nausea, vomiting, abdominal pain and diarrhea.  Genitourinary: Negative for dysuria and hematuria.  Neurological: Positive for weakness. Negative for dizziness, sensory change and focal weakness.  All other systems reviewed and are negative.   Nutrition: Heart healthy Tolerating Diet: Yes Tolerating PT: Evaluation noted   DRUG ALLERGIES:   Allergies  Allergen Reactions  . Citalopram Other (See Comments)    Reaction:  Altered mental status   . Cymbalta [Duloxetine Hcl] Other (See Comments)    Reaction:  Sedative for pt   . Imipramine Other (See Comments)    Reaction:  Unknown   . Proton Pump Inhibitors Other (See Comments)    Reaction:  Unknown     VITALS:  Blood pressure 125/109, pulse 73, temperature 98.5 F (36.9 C), temperature source Oral, resp. rate 16, height 5\' 4"  (1.626 m), weight 110.723 kg (244 lb 1.6 oz), SpO2 98 %.  PHYSICAL EXAMINATION:   Physical Exam  GENERAL:  81 y.o.-year-old patient lying in the bed with no acute distress.  EYES: Pupils equal, round, reactive to light and accommodation. No scleral icterus. Extraocular muscles intact.  HEENT: Head atraumatic, normocephalic. Oropharynx and  nasopharynx clear.  NECK:  Supple, no jugular venous distention. No thyroid enlargement, no tenderness.  LUNGS: Normal breath sounds bilaterally, no wheezing, rales, rhonchi. No use of accessory muscles of respiration.  CARDIOVASCULAR: S1, S2 normal. 2/6 systolic ejection murmur at the left sternal border, no rubs, or gallops.  ABDOMEN: Soft, nontender, nondistended. Bowel sounds present. No organomegaly or mass.  EXTREMITIES: No cyanosis, clubbing or edema b/l.    NEUROLOGIC: Cranial nerves II through XII are intact. No focal Motor or sensory deficits b/l.  Globally weak PSYCHIATRIC: The patient is alert and oriented x 3. Good affect SKIN: No obvious rash, lesion, or ulcer.    LABORATORY PANEL:   CBC  Recent Labs Lab 09/14/15 0656  WBC 6.6  HGB 11.2*  HCT 34.3*  PLT 240   ------------------------------------------------------------------------------------------------------------------  Chemistries   Recent Labs Lab 09/14/15 0656  NA 136  K 3.9  CL 104  CO2 25  GLUCOSE 132*  BUN 12  CREATININE 0.64  CALCIUM 8.7*   ------------------------------------------------------------------------------------------------------------------  Cardiac Enzymes  Recent Labs Lab 09/14/15 0656  TROPONINI <0.03   ------------------------------------------------------------------------------------------------------------------  RADIOLOGY:  Dg Chest 1 View  09/13/2015   CLINICAL DATA:  Weakness. Four falls in the last 2 weeks. Low back pain.  EXAM: CHEST 1 VIEW  COMPARISON:  06/16/2013  FINDINGS: Dual lead pacer noted, lead positioning unchanged. Prior CABG. Accounting for the AP projection, there is mild enlargement of the cardiopericardial silhouette.  Tortuous thoracic aorta, stable.  No edema.  The lungs appear clear.  IMPRESSION: 1. Mild enlargement of the cardiopericardial silhouette, without edema.  2. Prior CABG.  Dual lead pacer remains in place. 3. Stable tortuosity of the  thoracic aorta.   Electronically Signed   By: Van Clines M.D.   On: 09/13/2015 14:02   Dg Lumbar Spine Complete  09/13/2015   CLINICAL DATA:  Generalized weakness. For falls in the last 2 weeks. Low back pain.  EXAM: LUMBAR SPINE - COMPLETE 4+ VIEW  COMPARISON:  CT scan from 10/16/2014  FINDINGS: Bony demineralization. Mild sclerosis along the iliac side of both sacroiliac joints. Right hip implant. Degenerative spurring of the left femoral head.  Right facet arthropathy at L4-5 and L5-S1.  Aortoiliac atherosclerotic vascular disease.  3-4 mm of grade 1 degenerative anterolisthesis at L3-4 and L4-5, without pars defects observed. This is similar to the prior CT scan.  No fracture or new subluxation is identified.  IMPRESSION: 1. Reduced sensitivity due to bony demineralization. 2. No fracture or acute subluxation is identified. There is 3-4 mm of grade 1 degenerative anterolisthesis at L3-4 and L4-5. 3. Lower lumbar facet arthropathy. 4. Mild sclerosis along the iliac side of both sacroiliac joints. 5. Mild degenerative spurring of the left femoral head.   Electronically Signed   By: Van Clines M.D.   On: 09/13/2015 14:05   Dg Pelvis 1-2 Views  09/13/2015   CLINICAL DATA:  Fall x4 in the last 2 weeks. Bilateral hip pain and low back pain. Generalized weakness.  EXAM: PELVIS - 1-2 VIEW  COMPARISON:  CT abdomen and pelvis, 10/16/2014  FINDINGS: No acute fracture. Right hip prosthesis appears well seated and aligned. Arthropathic changes of the right hip with mild concentric hip joint space narrowing and marginal osteophytes from the femoral head are stable. Bones are demineralized. SI joints and symphysis pubis are normally aligned. Iliac artery vascular calcifications are noted in the upper pelvis.  IMPRESSION: No fracture or acute finding.   Electronically Signed   By: Lajean Manes M.D.   On: 09/13/2015 14:07   Ct Head Wo Contrast  09/13/2015   CLINICAL DATA:  Generalized weakness.   Multiple recent falls.  EXAM: CT HEAD WITHOUT CONTRAST  TECHNIQUE: Contiguous axial images were obtained from the base of the skull through the vertex without intravenous contrast.  COMPARISON:  10/23/2014 head CT.  FINDINGS: No evidence of parenchymal hemorrhage or extra-axial fluid collection. No mass lesion, mass effect, or midline shift.  No CT evidence of acute infarction. There is intracranial atherosclerotic arterial calcification. Nonspecific stable moderate subcortical and periventricular white matter hypodensity, most in keeping with chronic small vessel ischemic change.  There is stable diffuse cerebral volume loss. Stable ventricles, the size of which are concordant with the degree of cerebral volume loss.  The visualized paranasal sinuses are essentially clear. The mastoid air cells are unopacified. No evidence of calvarial fracture.  IMPRESSION: 1. No acute intracranial abnormality.  No calvarial fracture. 2. Intracranial atherosclerosis, diffuse cerebral volume loss and nonspecific chronic small vessel ischemic white matter change, stable.   Electronically Signed   By: Ilona Sorrel M.D.   On: 09/13/2015 14:21     ASSESSMENT AND PLAN:   81 year old female with past medical history of hypertension, aortic stenosis, history of diastolic CHF, stress incontinence, osteoarthritis, coronary disease, hyperlipidemia, fibromyalgia, morbid obesity, neuropathy, who presented to the hospital due to generalized weakness and frequent falls.  #1 generalized weakness/frequent falls-this is likely related to patient's deconditioning and competent with underlying UTI. -Continue supportive care with IV fluids, IV antibiotics for the UTI. Await physical therapy evaluation.  #  2 UTI-continue IV ceftriaxone, follow urine cultures.  #3 neuropathy-continue gabapentin.  #4 chronic pain-continue Norco.  #5 hypertension-continue Norvasc, metoprolol, losartan.  #6 GERD-continue Protonix.   All the records are  reviewed and case discussed with Care Management/Social Workerr. Management plans discussed with the patient, family and they are in agreement.  CODE STATUS: Full  DVT Prophylaxis: Lovenox  TOTAL TIME TAKING CARE OF THIS PATIENT: 30 minutes.   POSSIBLE D/C IN 1-2 DAYS, DEPENDING ON CLINICAL CONDITION.   Henreitta Leber M.D on 09/14/2015 at 2:06 PM  Between 7am to 6pm - Pager - [phone]  After 6pm go to [email or web address] - password [email or web address] [facility]  Office  [phone]  CC: Primary care physician; [name]

## 2015-09-14 NOTE — Progress Notes (Addendum)
Pt worried stating that she brought her purse with her.  Pt is describing it as a black medium sized bag.  No purse in patient's room at this time.  Spoke with security and will check to see if a purse was turned in to them.    At 0021 pt's purse was located in pt room.    Lynnda Shields, RN

## 2015-09-14 NOTE — Evaluation (Signed)
Physical Therapy Evaluation Patient Details Name: Michele Schultz MRN: [id number] DOB: 1931-06-12 Today's Date: 09/14/2015   History of Present Illness  Patient is an 81 y/o female that presents with 2-3 days of generalized weakness and R chest pain. Patient has a history of UTIs. She has been having a HHPT for 3x per week recently.   Clinical Impression  Patient presents with several days of generalized weakness, and inability to ambulate. She presents today with decreased bed mobility, transfers, and gait tolerance. Patient requires mod A x1 to transfer supine to sit as well as max A x1 with RW to transfer sit to stand. Patient is able to ambulate a limited distance with very slow gait speed indicating a high falls risk, and inability to perform prolonged ambulation due to weakness and deconditioning. Patient provides excellent participation with therapy during this session, but appears to be significantly decreased in her independence with mobility compared to her baseline (independent with AD per patient). Skilled PT services are indicated at this time to address the above deficits.     Follow Up Recommendations SNF    Equipment Recommendations  Rolling walker with 5" wheels    Recommendations for Other Services       Precautions / Restrictions Precautions Precautions: Fall Restrictions Weight Bearing Restrictions: No      Mobility  Bed Mobility Overal bed mobility: Needs Assistance Bed Mobility: Supine to Sit     Supine to sit: Mod assist     General bed mobility comments: Patient is able to bring her LEs to the EOB, however she requires significant assistance bringing her torso upright secondary to generalized deconditioning/weakness.  Transfers Overall transfer level: Needs assistance Equipment used: Rolling walker (2 wheeled) Transfers: Sit to/from Stand Sit to Stand: Max assist         General transfer comment: Patient transferred sit <--> stand x 2, both  times required cuing for anterior trunk lean and max A x 1 from PT, she is unsteady initially on standing.   Ambulation/Gait Ambulation/Gait assistance: Min assist Ambulation Distance (Feet): 3 Feet Assistive device: Rolling walker (2 wheeled) Gait Pattern/deviations: Step-to pattern;Decreased step length - right;Decreased step length - left;Trunk flexed;Narrow base of support;Shuffle   Gait velocity interpretation: <1.8 ft/sec, indicative of risk for recurrent falls General Gait Details: Patient is able to take several very short steps laterally then pivot to sit in the chair. She displays extremely slow gait speed and very minimal foot clearance.   Stairs            Wheelchair Mobility    Modified Rankin (Stroke Patients Only)       Balance Overall balance assessment: Needs assistance Sitting-balance support: Feet supported Sitting balance-Leahy Scale: Fair Sitting balance - Comments: No loss of balance or deficits noted once in sitting.    Standing balance support: Bilateral upper extremity supported Standing balance-Leahy Scale: Fair Standing balance comment: Once in standing patient did not buckle, however she did displays generalized weakness in short steps noted. Patient unable to take sufficient step lengths for any prolonged ambulation.                              Pertinent Vitals/Pain Pain Assessment: No/denies pain    Home Living Family/patient expects to be discharged to:: Private residence Living Arrangements: Alone   Type of Home: House Home Access: Level entry     Home Layout: One level Home Equipment: Bedside commode;Shower seat;Walker -  4 wheels;Wheelchair - manual      Prior Function Level of Independence: Independent with assistive device(s)         Comments: Per patient she has been ambulating with RW for short distances around her home recently.      Hand Dominance        Extremity/Trunk Assessment   Upper Extremity  Assessment: Overall WFL for tasks assessed           Lower Extremity Assessment: Overall WFL for tasks assessed;Generalized weakness         Communication   Communication: No difficulties  Cognition Arousal/Alertness: Awake/alert Behavior During Therapy: WFL for tasks assessed/performed Overall Cognitive Status: Within Functional Limits for tasks assessed                      General Comments      Exercises        Assessment/Plan    PT Assessment Patient needs continued PT services  PT Diagnosis Difficulty walking;Generalized weakness   PT Problem List Decreased strength;Decreased mobility;Decreased activity tolerance  PT Treatment Interventions DME instruction;Therapeutic activities;Therapeutic exercise;Gait training;Balance training   PT Goals (Current goals can be found in the Care Plan section) Acute Rehab PT Goals Patient Stated Goal: To improve her walking ability.  PT Goal Formulation: With patient Time For Goal Achievement: 09/28/15 Potential to Achieve Goals: Good    Frequency Min 2X/week   Barriers to discharge Decreased caregiver support Patient lives alone and requires significant assistance for mobility currently.     Co-evaluation               End of Session Equipment Utilized During Treatment: Gait belt Activity Tolerance: Patient tolerated treatment well Patient left: in chair;with call bell/phone within reach;with chair alarm set Nurse Communication: Mobility status    Functional Assessment Tool Used: Clinical judgement  Functional Limitation: Mobility: Walking and moving around Mobility: Walking and Moving Around Current Status (G9211): At least 60 percent but less than 80 percent impaired, limited or restricted Mobility: Walking and Moving Around Goal Status [phone]): At least 40 percent but less than 60 percent impaired, limited or restricted    Time: 1112-1134 PT Time Calculation (min) (ACUTE ONLY): 22 min   Charges:    PT Evaluation $Initial PT Evaluation Tier I: 1 Procedure     PT G Codes:   PT G-Codes **NOT FOR INPATIENT CLASS** Functional Assessment Tool Used: Clinical judgement  Functional Limitation: Mobility: Walking and moving around Mobility: Walking and Moving Around Current Status (Y8144): At least 60 percent but less than 80 percent impaired, limited or restricted Mobility: Walking and Moving Around Goal Status [phone]): At least 40 percent but less than 60 percent impaired, limited or restricted   Kerman Passey, PT, DPT    09/14/2015, 2:59 PM

## 2015-09-14 NOTE — Care Management Note (Signed)
Case Management Note  Patient Details  Name: TARAH BUBOLTZ MRN: [id number] Date of Birth: [birth year]  Subjective/Objective:                 Patient presents from home with generalized weakness, and is found to be positive for UTI.  Patient lives at home alone.  She lives at [facility].  Patient obtains her medications at [facility].  Patient has a rolling walker and BSC in the home.  Patient is open to [facility] for RN, Aide, and PT.  Corene Cornea from [facility]  Notified of admission.  Patient states that [facility] provides transportation to her doctors appointments.  Currently PT is recommending SNF.  Patient is in observation status under Medicare.  RNCM and SW involved in discharge planning.    Action/Plan: RN CM to follow  Expected Discharge Date:                  Expected Discharge Plan:     In-House Referral:     Discharge planning Services     Post Acute Care Choice:    Choice offered to:     DME Arranged:    DME Agency:     HH Arranged:    [place] Agency:     Status of Service:     Medicare Important Message Given:    Date Medicare IM Given:    Medicare IM give by:    Date Additional Medicare IM Given:    Additional Medicare Important Message give by:     If discussed at [place] of Stay Meetings, dates discussed:    Additional Comments:  Beverly Sessions, RN 09/14/2015, 3:09 PM

## 2015-09-15 ENCOUNTER — Inpatient Hospital Stay: Payer: Medicare Other

## 2015-09-15 DIAGNOSIS — B9620 Unspecified Escherichia coli [E. coli] as the cause of diseases classified elsewhere: Secondary | ICD-10-CM | POA: Diagnosis present

## 2015-09-15 DIAGNOSIS — G629 Polyneuropathy, unspecified: Secondary | ICD-10-CM | POA: Diagnosis present

## 2015-09-15 DIAGNOSIS — Z888 Allergy status to other drugs, medicaments and biological substances status: Secondary | ICD-10-CM | POA: Diagnosis not present

## 2015-09-15 DIAGNOSIS — Z950 Presence of cardiac pacemaker: Secondary | ICD-10-CM | POA: Diagnosis not present

## 2015-09-15 DIAGNOSIS — N390 Urinary tract infection, site not specified: Secondary | ICD-10-CM | POA: Diagnosis present

## 2015-09-15 DIAGNOSIS — Z8249 Family history of ischemic heart disease and other diseases of the circulatory system: Secondary | ICD-10-CM | POA: Diagnosis not present

## 2015-09-15 DIAGNOSIS — E785 Hyperlipidemia, unspecified: Secondary | ICD-10-CM | POA: Diagnosis present

## 2015-09-15 DIAGNOSIS — I110 Hypertensive heart disease with heart failure: Secondary | ICD-10-CM | POA: Diagnosis present

## 2015-09-15 DIAGNOSIS — R531 Weakness: Secondary | ICD-10-CM | POA: Diagnosis present

## 2015-09-15 DIAGNOSIS — Z87440 Personal history of urinary (tract) infections: Secondary | ICD-10-CM | POA: Diagnosis not present

## 2015-09-15 DIAGNOSIS — R296 Repeated falls: Secondary | ICD-10-CM | POA: Diagnosis present

## 2015-09-15 DIAGNOSIS — Z6841 Body Mass Index (BMI) 40.0 and over, adult: Secondary | ICD-10-CM | POA: Diagnosis not present

## 2015-09-15 DIAGNOSIS — I5032 Chronic diastolic (congestive) heart failure: Secondary | ICD-10-CM | POA: Diagnosis present

## 2015-09-15 DIAGNOSIS — Z833 Family history of diabetes mellitus: Secondary | ICD-10-CM | POA: Diagnosis not present

## 2015-09-15 DIAGNOSIS — E119 Type 2 diabetes mellitus without complications: Secondary | ICD-10-CM | POA: Diagnosis present

## 2015-09-15 DIAGNOSIS — I350 Nonrheumatic aortic (valve) stenosis: Secondary | ICD-10-CM | POA: Diagnosis present

## 2015-09-15 DIAGNOSIS — Z951 Presence of aortocoronary bypass graft: Secondary | ICD-10-CM | POA: Diagnosis not present

## 2015-09-15 DIAGNOSIS — Z96651 Presence of right artificial knee joint: Secondary | ICD-10-CM | POA: Diagnosis present

## 2015-09-15 DIAGNOSIS — M4800 Spinal stenosis, site unspecified: Secondary | ICD-10-CM | POA: Diagnosis present

## 2015-09-15 DIAGNOSIS — K219 Gastro-esophageal reflux disease without esophagitis: Secondary | ICD-10-CM | POA: Diagnosis present

## 2015-09-15 DIAGNOSIS — J45909 Unspecified asthma, uncomplicated: Secondary | ICD-10-CM | POA: Diagnosis present

## 2015-09-15 DIAGNOSIS — Z96649 Presence of unspecified artificial hip joint: Secondary | ICD-10-CM | POA: Diagnosis present

## 2015-09-15 DIAGNOSIS — Z79899 Other long term (current) drug therapy: Secondary | ICD-10-CM | POA: Diagnosis not present

## 2015-09-15 DIAGNOSIS — Z952 Presence of prosthetic heart valve: Secondary | ICD-10-CM | POA: Diagnosis not present

## 2015-09-15 DIAGNOSIS — G894 Chronic pain syndrome: Secondary | ICD-10-CM | POA: Diagnosis present

## 2015-09-15 DIAGNOSIS — M797 Fibromyalgia: Secondary | ICD-10-CM | POA: Diagnosis present

## 2015-09-15 DIAGNOSIS — Z9842 Cataract extraction status, left eye: Secondary | ICD-10-CM | POA: Diagnosis not present

## 2015-09-15 DIAGNOSIS — I2510 Atherosclerotic heart disease of native coronary artery without angina pectoris: Secondary | ICD-10-CM | POA: Diagnosis present

## 2015-09-15 DIAGNOSIS — Z7982 Long term (current) use of aspirin: Secondary | ICD-10-CM | POA: Diagnosis not present

## 2015-09-15 DIAGNOSIS — M1610 Unilateral primary osteoarthritis, unspecified hip: Secondary | ICD-10-CM | POA: Diagnosis present

## 2015-09-15 DIAGNOSIS — Z9841 Cataract extraction status, right eye: Secondary | ICD-10-CM | POA: Diagnosis not present

## 2015-09-15 LAB — GLUCOSE, CAPILLARY
GLUCOSE-CAPILLARY: 118 mg/dL — AB (ref 65–99)
GLUCOSE-CAPILLARY: 109 mg/dL — AB (ref 65–99)
GLUCOSE-CAPILLARY: 149 mg/dL — AB (ref 65–99)
Glucose-Capillary: 119 mg/dL — ABNORMAL HIGH (ref 65–99)

## 2015-09-15 MED ORDER — CIPROFLOXACIN HCL 250 MG PO TABS: 250.0000 mg | ORAL_TABLET | Freq: Two times a day (BID) | ORAL | Status: DC

## 2015-09-15 NOTE — Discharge Summary (Signed)
[facility] [place] NAME: Yuvonne Lanahan    MR#:  [id number]  DATE OF BIRTH:  12-03-1931  DATE OF ADMISSION:  09/13/2015 ADMITTING PHYSICIAN: Dustin Flock, MD  DATE OF DISCHARGE: 09/15/2015  PRIMARY CARE PHYSICIAN: [name]    ADMISSION DIAGNOSIS:  Generalized weakness [R53.1]  DISCHARGE DIAGNOSIS:  Active Problems:   Generalized weakness   Chest pain   SECONDARY DIAGNOSIS:   Past Medical History  Diagnosis Date  . Hypertension   . Anemia   . Neuralgia, neuritis, and radiculitis, unspecified   . Coronary artery disease     a. s/p 2v CABG 11/12 (VG-LAD, VG-OM1); b. Lexiscan 08/2015: normal study  . Aortic stenosis, severe     a. s/p Magna Ease pericardial tissue valve size 21 mm replacement in 10/2011 for severe AS 11/12; b. echo 07/2015: EF 55-60% mod concentric LVH, GR1DD, LA mildly dilated, PASP 45 mm Hg  . Onychia and paronychia of toe   . Osteoarthrosis, unspecified whether generalized or localized, pelvic region and thigh     mainly in her back and knees  . Enthesopathy of hip region   . Esophageal reflux     followed by Dr.Seigal. stabilized with a combination of Nexium and Zantac  . Knee joint replacement by other means   . Stress incontinence, female     followed by Dr.Cope  . Complete heart block [facility])     a. s/p St Jude PPM 10/2011; b. SN # [id number]  . Cardiac pacemaker -st Judes     11/12  . Chronic diastolic heart failure ([place])     a. echo 2014: EF 55-60%, no RWMA, GR1DD, PASP 47 mm Hg; b. echo 2012L EF 55-60%, GR1DD, mild MR  . Type II or unspecified type diabetes mellitus without mention of complication, not stated as uncontrolled     pt. reports that she is borderline   . Spinal stenosis   . Neuropathy ([place])   . Morbid obesity ([place])   . RAD (reactive airway disease)     a. chronic SOB  . Fibromyalgia   . HLD (hyperlipidemia)     HOSPITAL COURSE:   81 year old female with past  medical history of hypertension, aortic stenosis, history of diastolic CHF, stress incontinence, osteoarthritis, coronary disease, hyperlipidemia, fibromyalgia, morbid obesity, neuropathy, who presented to the hospital due to generalized weakness and frequent falls.  #1 generalized weakness/frequent falls-this is likely related to patient's deconditioning and complicated with underlying UTI. -Patient has received IV antibiotics for UTI and is being discharged on oral ciprofloxacin. Patient was seen by physical therapy and recommended short-term rehabilitation but she does not want to go there and therefore currently is being discharged with aggressive home health services.  #2 UTI-patient was on IV ceftriaxone in the hospital but is currently being discharged on oral ciprofloxacin. Her urine cultures are positive for 100,000 colonies of gram-negative rod identification is still pending.  #3 neuropathy-patient will continue gabapentin.  #4 chronic pain-patient will continue Norco.  #5 hypertension-patient will continue Norvasc, metoprolol, losartan.  #6 GERD-patient will continue Protonix.  Patient is being discharged home with home health physical therapy, nursing, home health aide, social work services.  DISCHARGE CONDITIONS:   Stable  CONSULTS OBTAINED:  Treatment Team:  Dustin Flock, MD  DRUG ALLERGIES:   Allergies  Allergen Reactions  . Citalopram Other (See Comments)    Reaction:  Altered mental status   . Cymbalta [Duloxetine Hcl] Other (See Comments)  Reaction:  Sedative for pt   . Imipramine Other (See Comments)    Reaction:  Unknown   . Proton Pump Inhibitors Other (See Comments)    Reaction:  Unknown     DISCHARGE MEDICATIONS:   Current Discharge Medication List    START taking these medications   Details  ciprofloxacin (CIPRO) 250 MG tablet Take 1 tablet (250 mg total) by mouth 2 (two) times daily. Qty: 10 tablet, Refills: 0      CONTINUE these  medications which have NOT CHANGED   Details  albuterol-ipratropium (COMBIVENT) 18-103 MCG/ACT inhaler Inhale 2 puffs into the lungs every 6 (six) hours as needed for wheezing. Qty: 1 Inhaler, Refills: 4    amLODipine (NORVASC) 5 MG tablet Take 1 tablet (5 mg total) by mouth daily. Qty: 30 tablet, Refills: 6    aspirin EC 81 MG tablet Take 81 mg by mouth daily.    azelastine (ASTELIN) 0.1 % nasal spray Place 1 spray into both nostrils 2 (two) times daily.    benzonatate (TESSALON) 100 MG capsule Take 200 mg by mouth 3 (three) times daily as needed for cough.     cyanocobalamin (,VITAMIN B-12,) 1000 MCG/ML injection Inject 1,000 mcg into the muscle every 30 (thirty) days.    cycloSPORINE (RESTASIS) 0.05 % ophthalmic emulsion Place 1 drop into both eyes 2 (two) times daily.     furosemide (LASIX) 20 MG tablet Take 1 tablet (20 mg total) by mouth every other day. Qty: 30 tablet, Refills: 6    gabapentin (NEURONTIN) 100 MG capsule Take 200 mg by mouth at bedtime.   Associated Diagnoses: Cardiac pacemaker in situ    HYDROcodone-acetaminophen (NORCO) 10-325 MG per tablet Take 1 tablet by mouth every 4 (four) hours as needed for moderate pain.     losartan (COZAAR) 25 MG tablet Take 25 mg by mouth daily.    magnesium hydroxide (MILK OF MAGNESIA) 400 MG/5ML suspension Take 30 mLs by mouth daily as needed for mild constipation.     metoprolol succinate (TOPROL-XL) 50 MG 24 hr tablet Take 50 mg by mouth daily.    nitroGLYCERIN (NITROSTAT) 0.4 MG SL tablet Place 1 tablet (0.4 mg total) under the tongue every 5 (five) minutes as needed for chest pain. Qty: 90 tablet, Refills: 3    pantoprazole (PROTONIX) 40 MG tablet Take 40 mg by mouth 2 (two) times daily.      phenazopyridine (PYRIDIUM) 200 MG tablet Take 1 tablet (200 mg total) by mouth 3 (three) times daily as needed for pain. Qty: 20 tablet, Refills: 0    potassium chloride (K-DUR) 10 MEQ tablet Take 10 mEq by mouth daily.     promethazine (PHENERGAN) 25 MG tablet Take 1 tablet (25 mg total) by mouth every 6 (six) hours as needed for nausea or vomiting. Qty: 30 tablet, Refills: 0         DISCHARGE INSTRUCTIONS:   DIET:  Cardiac diet  DISCHARGE CONDITION:  Stable  ACTIVITY:  Activity as tolerated  OXYGEN:  Home Oxygen: No.   Oxygen Delivery: room air  DISCHARGE LOCATION:  Home with home health nursing, physical therapy, home health aide, social work.   If you experience worsening of your admission symptoms, develop shortness of breath, life threatening emergency, suicidal or homicidal thoughts you must seek medical attention immediately by calling 911 or calling your MD immediately  if symptoms less severe.  You Must read complete instructions/literature along with all the possible adverse reactions/side effects for all the Medicines  you take and that have been prescribed to you. Take any new Medicines after you have completely understood and accpet all the possible adverse reactions/side effects.   Please note  You were cared for by a hospitalist during your hospital stay. If you have any questions about your discharge medications or the care you received while you were in the hospital after you are discharged, you can call the unit and asked to speak with the hospitalist on call if the hospitalist that took care of you is not available. Once you are discharged, your primary care physician will handle any further medical issues. Please note that NO REFILLS for any discharge medications will be authorized once you are discharged, as it is imperative that you return to your primary care physician (or establish a relationship with a primary care physician if you do not have one) for your aftercare needs so that they can reassess your need for medications and monitor your lab values.     Today   Still feels weak and is having some back pain. No fever, chills, nausea, vomiting.  VITAL SIGNS:  Blood  pressure 113/56, pulse 80, temperature 98.7 F (37.1 C), temperature source Oral, resp. rate 20, height 5\' 4"  (1.626 m), weight 110.723 kg (244 lb 1.6 oz), SpO2 97 %.  I/O:   Intake/Output Summary (Last 24 hours) at 09/15/15 1639 Last data filed at 09/15/15 1330  Gross per 24 hour  Intake    720 ml  Output      0 ml  Net    720 ml    PHYSICAL EXAMINATION:   GENERAL: 81 y.o.-year-old obese patient lying in the bed with no acute distress.  EYES: Pupils equal, round, reactive to light and accommodation. No scleral icterus. Extraocular muscles intact.  HEENT: Head atraumatic, normocephalic. Oropharynx and nasopharynx clear.  NECK: Supple, no jugular venous distention. No thyroid enlargement, no tenderness.  LUNGS: Normal breath sounds bilaterally, no wheezing, rales, rhonchi. No use of accessory muscles of respiration.  CARDIOVASCULAR: S1, S2 normal. 2/6 systolic ejection murmur at the left sternal border, no rubs, or gallops.  ABDOMEN: Soft, nontender, nondistended. Bowel sounds present. No organomegaly or mass.  EXTREMITIES: No cyanosis, clubbing or edema b/l.  NEUROLOGIC: Cranial nerves II through XII are intact. No focal Motor or sensory deficits b/l. Globally weak PSYCHIATRIC: The patient is alert and oriented x 3. Good affect SKIN: No obvious rash, lesion, or ulcer.   DATA REVIEW:   CBC  Recent Labs Lab 09/14/15 0656  WBC 6.6  HGB 11.2*  HCT 34.3*  PLT 240    Chemistries   Recent Labs Lab 09/14/15 0656  NA 136  K 3.9  CL 104  CO2 25  GLUCOSE 132*  BUN 12  CREATININE 0.64  CALCIUM 8.7*    Cardiac Enzymes  Recent Labs Lab 09/14/15 0656  TROPONINI <0.03    Microbiology Results  Results for orders placed or performed during the hospital encounter of 09/13/15  MRSA PCR Screening     Status: None   Collection Time: 09/13/15  9:30 PM  Result Value Ref Range Status   MRSA by PCR NEGATIVE NEGATIVE Final    Comment:        The GeneXpert MRSA  Assay (FDA approved for NASAL specimens only), is one component of a comprehensive MRSA colonization surveillance program. It is not intended to diagnose MRSA infection nor to guide or monitor treatment for MRSA infections.   Urine culture     Status: None (  Preliminary result)   Collection Time: 09/14/15 11:25 AM  Result Value Ref Range Status   Specimen Description URINE, RANDOM  Final   Special Requests NONE  Final   Culture   Final    >=100,000 COLONIES/mL GRAM NEGATIVE RODS IDENTIFICATION AND SUSCEPTIBILITIES TO FOLLOW    Report Status PENDING  Incomplete    RADIOLOGY:  Ct Head Wo Contrast  09/15/2015  CLINICAL DATA:  Multiple recent falls. EXAM: CT HEAD WITHOUT CONTRAST TECHNIQUE: Contiguous axial images were obtained from the base of the skull through the vertex without intravenous contrast. COMPARISON:  09/13/2015 FINDINGS: No acute intracranial abnormality. Specifically, no hemorrhage, hydrocephalus, mass lesion, acute infarction, or significant intracranial injury. No acute calvarial abnormality. There is atrophy and chronic small vessel disease changes. Visualized paranasal sinuses and mastoids clear. Orbital soft tissues unremarkable. IMPRESSION: No acute intracranial abnormality. Electronically Signed   By: Rolm Baptise M.D.   On: 09/15/2015 14:16      Management plans discussed with the patient, family and they are in agreement.  CODE STATUS:     Code Status Orders        Start     Ordered   09/13/15 1852  Full code   Continuous     09/13/15 1851    Advance Directive Documentation        Most Recent Value   Type of Advance Directive  Healthcare Power of Attorney, Out of facility DNR (pink MOST or yellow form)   Pre-existing out of facility DNR order (yellow form or pink MOST form)     "MOST" Form in Place?        TOTAL TIME TAKING CARE OF THIS PATIENT: 40 minutes.    Henreitta Leber M.D on 09/15/2015 at 4:39 PM  Between 7am to 6pm - Pager -  [phone]  After 6pm go to [email or web address] - password [email or web address] [place] [facility]  Office  [phone]  CC: Primary care physician; [name]

## 2015-09-15 NOTE — Progress Notes (Signed)
Physical Therapy Treatment Patient Details Name: BARRY CULVERHOUSE MRN: [id number] DOB: [birth year] Today's Date: 09/15/2015    History of Present Illness Patient is an 81 y/o female that presents with 2-3 days of generalized weakness and R chest pain. Patient has a history of UTIs. She has been having a HHPT for 3x per week recently.     PT Comments    Patient continues to present with slightly confused affect stating she has known this PT since "way back". She complains of a R shoulder pain throughout this session, which she attributes to a fall several days "it's hurt me ever since". PT evaluated this patient yesterday and did not note these complaints nor any loss of ROM. She limites her active movements with RUE today secondary to pain, however she is able to weight bear through her RUE spontaneously to provide balance. PT encouraged patient to decrease WB on RUE given the above. Patient did agree to standing as she was able to provide effort through bilateral UEs yesterday, and again required max A x1 with RW to complete transfer. Once in standing no loss of balance noted, however patient was unable to take any steps secondary to weakness. Given the above, if patient continues to complain of RUE pain, an X-ray to rule out fracture may be indicated. Skilled acute PT services continue to be indicated to address the above deficits.   Follow Up Recommendations  SNF     Equipment Recommendations  Rolling walker with 5" wheels    Recommendations for Other Services       Precautions / Restrictions Precautions Precautions: Fall Restrictions Weight Bearing Restrictions: No    Mobility  Bed Mobility Overal bed mobility: Needs Assistance Bed Mobility: Supine to Sit;Sit to Supine     Supine to sit: Mod assist;Max assist Sit to supine: Mod assist;Max assist   General bed mobility comments: Patient is able to bring her LEs to EOB, she requires significant mod-max level assist to manage  torso secondary to RUE pain and trunkal weakness.   Transfers Overall transfer level: Needs assistance Equipment used: Rolling walker (2 wheeled) Transfers: Sit to/from Stand Sit to Stand: Max assist         General transfer comment: Pt attempted sit <-> stand x 2, requiring max A x 1 and RW with significant cuing for anterior trunk lean and hand placement for transfer.   Ambulation/Gait             General Gait Details: Attempted ambulation, however patient unable to bring her RLE off the floor for any significant stepping to occur. Deferred further ambulation at this time, patient able to take small steps medially with LLE.    Stairs            Wheelchair Mobility    Modified Rankin (Stroke Patients Only)       Balance Overall balance assessment: Needs assistance Sitting-balance support: Feet supported Sitting balance-Leahy Scale: Fair Sitting balance - Comments: Patient initially leaning to her right significantly and holding herself up via RUE, patient cued to bring her torso more upright and have RUE on her knee. Patient able to hold balance, with moments of posterior loss of balance which she was able to recover from.  Postural control: Right lateral lean Standing balance support: Bilateral upper extremity supported Standing balance-Leahy Scale: Fair Standing balance comment: No loss of balance in standing with RW, though generalized weakness noted. No buckling displayed by patient.  Cognition Arousal/Alertness: Awake/alert Behavior During Therapy: WFL for tasks assessed/performed;Anxious Overall Cognitive Status: Within Functional Limits for tasks assessed                      Exercises Total Joint Exercises Ankle Circles/Pumps: Both;AROM;10 reps Hip ABduction/ADduction: AROM;Both;10 reps ("Pain in my groin" bilaterally) Straight Leg Raises: AROM;10 reps;Both (Fatigued quickly ) Other Exercises Other Exercises: From  supine flexion through available range, painful at around 90 degrees AROM Other Exercises: Actively patient can flex RUE to ~ 90 degrees before complaints of pain Other Exercises: Elbow flexion/extension weak on RUE, appear pain limited. Abduction, retraction, and flexion weak and pain limited.     General Comments        Pertinent Vitals/Pain Pain Assessment:  (Patient complains of 5/10 R shoulder girdle pain throughout exam that extends from scapula to clavicle into her middle deltoid)    Home Living                      Prior Function            PT Goals (current goals can now be found in the care plan section) Acute Rehab PT Goals Patient Stated Goal: To improve her walking ability.  PT Goal Formulation: With patient Time For Goal Achievement: 09/28/15 Potential to Achieve Goals: Fair Progress towards PT goals: Not progressing toward goals - comment (RUE pain she is complaining of hinders her mobility with PT)    Frequency  Min 2X/week    PT Plan Current plan remains appropriate    Co-evaluation             End of Session Equipment Utilized During Treatment: Gait belt Activity Tolerance: Patient limited by pain Patient left: in bed;with bed alarm set;with call bell/phone within reach     Time: 1308-6578 PT Time Calculation (min) (ACUTE ONLY): 28 min  Charges:  $Gait Training: 8-22 mins $Therapeutic Activity: 8-22 mins                    G Codes:      Kerman Passey, PT, DPT    09/15/2015, 3:28 PM

## 2015-09-15 NOTE — Care Management (Signed)
Patient presents from [facility] with chest pain.  Patient has ruled out for any cardiac problem.  Physical therapy has recommended skilled nursing.  Patient lives alone and she admits that she had three falls 'the other day."  Patient admits to chronic pain issuses and is on chronic hydrocodone.  She has not been able to walk to the dining room for her meals in over one year.  She has chore aide services 5 days a week for 2 hours a day.  Discussed physical therapy recommendations and that medicare would not cover a skilled nursing stay.  Patient was admitted under observation but received inpatient determination from Med Management effective 10/12.  Attending has stated that patient is medically stable for discharge today- which would mean still would not have a three night qualifying stay.   Discussed increasing home physical therapy and nursing visits with [facility].  Spoke with [facility] and agency is not able to increase aide services under chore but patient could pay privately. Patient says that "I have no money".  I do not know my monthly income.  My husband died three weeks ago and there are hospital bills."  Patient has no children.  She has many nieces an nephews but will not allow CM to contact any of her relatives.  Discussed private pay at skilled nursing and or assisted living.  Updated CSW.  It is reported during progression that patient and a niece reported that patient has slurred speech and right sided weakness during presentation but unable to find documentation to support this.  Head CT was negative.  Updated attending and brain MRI obtained to rule out stroke.

## 2015-09-15 NOTE — Care Management (Signed)
Discharge postponed until 10/13 .  Patient requests ems transport home.  CSW informed.   Patient's aide from [facility] can meet patient at the home upon arrival with ems.  Discussed with attending. Discussed the possibility that patient's current UTI may be basis for increased weakness and may benefit from additional doses of IV antibiotic.  Urine culture shows > 100,000 gram neg rods.  Met with patient again and had very frank discussion regarding her  possible "long term" care needs.  She says it is all in God's hands.

## 2015-09-15 NOTE — Discharge Instructions (Signed)
[facility] will visit 10/13.  Nursing and physical therapy visits have been increased.    [facility] have been notified of your discharge.

## 2015-09-15 NOTE — Care Management (Signed)
Notified [facility] and [facility] of discharge.  Patient was not able to have brain MRI due to patient having pacemaker.   Head CT repeated and was negative.  Informed that patient now complaining of pain in her shoulder. She demonstrates full range of motion.  Discussed if it continued that she can pursue work up as outpatient.  Patient will have home visits 10/13.  Discussed with [facility] that needed to have social work evaluation asap for long range planning for this patient.  Physical therapy and nursing visits increased to 4 times a week.

## 2015-09-15 NOTE — Progress Notes (Signed)
Patient does not have any one that come and pick her up and no one to meet her at her house from EMS to assist her to get into the house and ready for bed. After speaking with care management and social work, they have stated the patient will have to stay tonight and go home in the morning when home health can help her.

## 2015-09-15 NOTE — Progress Notes (Signed)
[facility] [place] NAME: Michele Schultz    MR#:  [id number]  DATE OF BIRTH:  03-14-1931  SUBJECTIVE:  CHIEF COMPLAINT:   Chief Complaint  Patient presents with  . Weakness   Patient here with generalized weakness and frequent falls. Denies any chest pain, shortness of breath, nausea, vomiting. Still feels quite weak.  Noted to have a UTI and urinalysis.  REVIEW OF SYSTEMS:    Review of Systems  Constitutional: Negative for fever and chills.  HENT: Negative for congestion and tinnitus.   Eyes: Negative for blurred vision and double vision.  Respiratory: Negative for cough, shortness of breath and wheezing.   Cardiovascular: Negative for chest pain, orthopnea and PND.  Gastrointestinal: Negative for nausea, vomiting, abdominal pain and diarrhea.  Genitourinary: Negative for dysuria and hematuria.  Neurological: Positive for weakness. Negative for dizziness, sensory change and focal weakness.  All other systems reviewed and are negative.   Nutrition: Heart healthy Tolerating Diet: Yes Tolerating PT: Evaluation noted   DRUG ALLERGIES:   Allergies  Allergen Reactions  . Citalopram Other (See Comments)    Reaction:  Altered mental status   . Cymbalta [Duloxetine Hcl] Other (See Comments)    Reaction:  Sedative for pt   . Imipramine Other (See Comments)    Reaction:  Unknown   . Proton Pump Inhibitors Other (See Comments)    Reaction:  Unknown     VITALS:  Blood pressure 113/56, pulse 80, temperature 98.7 F (37.1 C), temperature source Oral, resp. rate 20, height 5\' 4"  (1.626 m), weight 110.723 kg (244 lb 1.6 oz), SpO2 97 %.  PHYSICAL EXAMINATION:   Physical Exam  GENERAL:  81 y.o.-year-old obese patient lying in the bed with no acute distress.  EYES: Pupils equal, round, reactive to light and accommodation. No scleral icterus. Extraocular muscles intact.  HEENT: Head atraumatic, normocephalic. Oropharynx and  nasopharynx clear.  NECK:  Supple, no jugular venous distention. No thyroid enlargement, no tenderness.  LUNGS: Normal breath sounds bilaterally, no wheezing, rales, rhonchi. No use of accessory muscles of respiration.  CARDIOVASCULAR: S1, S2 normal. 2/6 systolic ejection murmur at the left sternal border, no rubs, or gallops.  ABDOMEN: Soft, nontender, nondistended. Bowel sounds present. No organomegaly or mass.  EXTREMITIES: No cyanosis, clubbing or edema b/l.    NEUROLOGIC: Cranial nerves II through XII are intact. No focal Motor or sensory deficits b/l.  Globally weak PSYCHIATRIC: The patient is alert and oriented x 3. Good affect SKIN: No obvious rash, lesion, or ulcer.    LABORATORY PANEL:   CBC  Recent Labs Lab 09/14/15 0656  WBC 6.6  HGB 11.2*  HCT 34.3*  PLT 240   ------------------------------------------------------------------------------------------------------------------  Chemistries   Recent Labs Lab 09/14/15 0656  NA 136  K 3.9  CL 104  CO2 25  GLUCOSE 132*  BUN 12  CREATININE 0.64  CALCIUM 8.7*   ------------------------------------------------------------------------------------------------------------------  Cardiac Enzymes  Recent Labs Lab 09/14/15 0656  TROPONINI <0.03   ------------------------------------------------------------------------------------------------------------------  RADIOLOGY:  Ct Head Wo Contrast  09/15/2015  CLINICAL DATA:  Multiple recent falls. EXAM: CT HEAD WITHOUT CONTRAST TECHNIQUE: Contiguous axial images were obtained from the base of the skull through the vertex without intravenous contrast. COMPARISON:  09/13/2015 FINDINGS: No acute intracranial abnormality. Specifically, no hemorrhage, hydrocephalus, mass lesion, acute infarction, or significant intracranial injury. No acute calvarial abnormality. There is atrophy and chronic small vessel disease changes. Visualized paranasal sinuses and mastoids clear. Orbital  soft  tissues unremarkable. IMPRESSION: No acute intracranial abnormality. Electronically Signed   By: Rolm Baptise M.D.   On: 09/15/2015 14:16     ASSESSMENT AND PLAN:   81 year old female with past medical history of hypertension, aortic stenosis, history of diastolic CHF, stress incontinence, osteoarthritis, coronary disease, hyperlipidemia, fibromyalgia, morbid obesity, neuropathy, who presented to the hospital due to generalized weakness and frequent falls.  #1 generalized weakness/frequent falls-this is likely related to patient's deconditioning and complicated with underlying UTI. -Continue supportive care with IV fluids, IV antibiotics for the UTI.  - seen by PT and they recommend SNF but pt. Does not want to go to a SNF.    #2 UTI-continue IV ceftriaxone - urine cultures growing 100,000 col of gram (-) rod but ID pending.   #3 neuropathy/Fibromyalgia-continue gabapentin.  #4 chronic pain-continue Norco.  #5 hypertension-continue Norvasc, metoprolol, losartan.  #6 GERD-continue Protonix.  Likely d/c home with Home health tomorrow a.m.   All the records are reviewed and case discussed with Care Management/Social Workerr. Management plans discussed with the patient, family and they are in agreement.  CODE STATUS: Full  DVT Prophylaxis: Lovenox  TOTAL TIME TAKING CARE OF THIS PATIENT: 35 minutes.   POSSIBLE D/C IN 1-2 DAYS, DEPENDING ON CLINICAL CONDITION.  Greater than 50% of time spent in coordination of care with discussion with patient and also Care manager.    Henreitta Leber M.D on 09/15/2015 at 5:10 PM  Between 7am to 6pm - Pager - [phone]  After 6pm go to [email or web address] - password [email or web address] [facility]  Office  [phone]  CC: Primary care physician; [name]

## 2015-09-16 LAB — URINE CULTURE: Culture: >=100000

## 2015-09-16 LAB — GLUCOSE, CAPILLARY
GLUCOSE-CAPILLARY: 110 mg/dL — AB (ref 65–99)
GLUCOSE-CAPILLARY: 169 mg/dL — AB (ref 65–99)
GLUCOSE-CAPILLARY: 176 mg/dL — AB (ref 65–99)
Glucose-Capillary: 108 mg/dL — ABNORMAL HIGH (ref 65–99)

## 2015-09-16 NOTE — Progress Notes (Signed)
Physical Therapy Treatment Patient Details Name: Michele Schultz MRN: [id number] DOB: [birth year] Today's Date: 09/16/2015    History of Present Illness Patient is an 81 y/o female that presents with 2-3 days of generalized weakness and R chest pain. Patient has a history of UTIs. She has been having a HHPT for 3x per week recently.     PT Comments    Pt still requiring mod assist to stand with RW (pt tried to stand multiple times on her own with RW and given vc's as well but pt still unable to stand on her own).  Pt ambulated a few steps with RW with assist but limited distance d/t incontinence of urine.  Pt requiring min assist x1 to steady in standing position (pt fatigued with time standing) with use of RW and 2nd assist for clean-up.  Discussed with pt her prior and current level of assist needs and recommendations for STR.  Pt reporting understanding need for STR and agreeable to STR:  CM notified.   Follow Up Recommendations  SNF     Equipment Recommendations  Rolling walker with 5" wheels    Recommendations for Other Services       Precautions / Restrictions Precautions Precautions: Fall Restrictions Weight Bearing Restrictions: No    Mobility  Bed Mobility Overal bed mobility: Needs Assistance Bed Mobility: Supine to Sit     Supine to sit: Supervision     General bed mobility comments: increased time to perform on her own; heavy use of side rail  Transfers Overall transfer level: Needs assistance Equipment used: Rolling walker (2 wheeled) Transfers: Sit to/from [facility] Sit to Stand: Mod assist         General transfer comment: Pt attempted to stand multiple times on her own but was unable with vc's and use of RW; pt stood 2x's during session with assist (1x from bed; 1x from chair and stood for about 1 1/2 minutes for cleanup with min assist to steady and use of RW)  Ambulation/Gait Ambulation/Gait assistance: Min assist Ambulation  Distance (Feet): 3 Feet Assistive device: Rolling walker (2 wheeled)       General Gait Details: decreased B step length/foot clearance/heelstrike; limited distance d/t pt incontinent of urine   Stairs            Wheelchair Mobility    Modified Rankin (Stroke Patients Only)       Balance Overall balance assessment: Needs assistance Sitting-balance support: Bilateral upper extremity supported;Feet supported Sitting balance-Leahy Scale: Good     Standing balance support: Bilateral upper extremity supported (on RW) Standing balance-Leahy Scale: Fair                      Cognition Arousal/Alertness: Awake/alert Behavior During Therapy: WFL for tasks assessed/performed Overall Cognitive Status: Within Functional Limits for tasks assessed                      Exercises      General Comments   Nursing cleared pt for participation in physical therapy.  Pt agreeable to PT session.      Pertinent Vitals/Pain Pain Assessment: 0-10 Pain Score: 4  Pain Location: R shoulder lateral joint line Pain Descriptors / Indicators: Sore;Tender (with shoulder elevation (no change in pain with WB'ing through R UE)) Pain Intervention(s): Limited activity within patient's tolerance;Monitored during session;Repositioned  Vitals stable and WFL throughout treatment session.    Home Living  Prior Function            PT Goals (current goals can now be found in the care plan section) Acute Rehab PT Goals Patient Stated Goal: To be able to walk independently again PT Goal Formulation: With patient Time For Goal Achievement: 09/28/15 Potential to Achieve Goals: Good Progress towards PT goals: Progressing toward goals    Frequency  Min 2X/week    PT Plan Current plan remains appropriate    Co-evaluation             End of Session Equipment Utilized During Treatment: Gait belt Activity Tolerance: Patient tolerated treatment  well Patient left: in chair;with call bell/phone within reach;with chair alarm set     Time: 0156-1537 PT Time Calculation (min) (ACUTE ONLY): 38 min  Charges:  $Therapeutic Activity: 38-52 mins                    G CodesLeitha Bleak 10-10-15, 10:38 AM Leitha Bleak, [place]

## 2015-09-16 NOTE — Care Management (Signed)
Patient is not able to stand today with assist.  There is concern that patient would be unsafe at home.  In the presence of patient a caregiver is visiting and relays that the patient has had an acute decline in her ability to walk over the last seven days.  This may further support that patient's UTI has adversely affected her functional status.  Patient requests discharge home today, then request skilled nursing placement.  Patient gave CM permission to speak with her niece- [name] to discuss discharge plans.  [name] says that she has dual POA for patient but understands that at present, patient can make her healthcare decisions.  Together the patient and her niece agree that skilled nursing placement is the safest discharge option with goal of increasing functional status and returning home.

## 2015-09-16 NOTE — Progress Notes (Signed)
[facility] [place] NAME: Michele Schultz    MR#:  [id number]  DATE OF BIRTH:  11-30-1931  SUBJECTIVE:  CHIEF COMPLAINT:   Chief Complaint  Patient presents with  . Weakness   Patient here with generalized weakness and frequent falls. Denies any chest pain, shortness of breath, nausea, vomiting. Continues to have significant weakness and could not get to the bedside commode with assistance.    REVIEW OF SYSTEMS:    Review of Systems  Constitutional: Negative for fever and chills.  HENT: Negative for congestion and tinnitus.   Eyes: Negative for blurred vision and double vision.  Respiratory: Negative for cough, shortness of breath and wheezing.   Cardiovascular: Negative for chest pain, orthopnea and PND.  Gastrointestinal: Negative for nausea, vomiting, abdominal pain and diarrhea.  Genitourinary: Negative for dysuria and hematuria.  Neurological: Positive for weakness (generalized. ). Negative for dizziness, sensory change and focal weakness.  All other systems reviewed and are negative.   Nutrition: Heart healthy Tolerating Diet: Yes Tolerating PT: Evaluation noted   DRUG ALLERGIES:   Allergies  Allergen Reactions  . Citalopram Other (See Comments)    Reaction:  Altered mental status   . Cymbalta [Duloxetine Hcl] Other (See Comments)    Reaction:  Sedative for pt   . Imipramine Other (See Comments)    Reaction:  Unknown   . Proton Pump Inhibitors Other (See Comments)    Reaction:  Unknown     VITALS:  Blood pressure 124/72, pulse 72, temperature 98.3 F (36.8 C), temperature source Oral, resp. rate 18, height 5\' 4"  (1.626 m), weight 110.723 kg (244 lb 1.6 oz), SpO2 96 %.  PHYSICAL EXAMINATION:   Physical Exam  GENERAL:  81 y.o.-year-old obese patient sitting up in chair in no acute distress.  EYES: Pupils equal, round, reactive to light and accommodation. No scleral icterus. Extraocular muscles intact.   HEENT: Head atraumatic, normocephalic. Oropharynx and nasopharynx clear.  NECK:  Supple, no jugular venous distention. No thyroid enlargement, no tenderness.  LUNGS: Normal breath sounds bilaterally, no wheezing, rales, rhonchi. No use of accessory muscles of respiration.  CARDIOVASCULAR: S1, S2 normal. 2/6 systolic ejection murmur at the left sternal border, no rubs, or gallops.  ABDOMEN: Soft, nontender, nondistended. Bowel sounds present. No organomegaly or mass.  EXTREMITIES: No cyanosis, clubbing or edema b/l.    NEUROLOGIC: Cranial nerves II through XII are intact. No focal Motor or sensory deficits b/l.  Globally weak PSYCHIATRIC: The patient is alert and oriented x 3. Good affect SKIN: No obvious rash, lesion, or ulcer.    LABORATORY PANEL:   CBC  Recent Labs Lab 09/14/15 0656  WBC 6.6  HGB 11.2*  HCT 34.3*  PLT 240   ------------------------------------------------------------------------------------------------------------------  Chemistries   Recent Labs Lab 09/14/15 0656  NA 136  K 3.9  CL 104  CO2 25  GLUCOSE 132*  BUN 12  CREATININE 0.64  CALCIUM 8.7*   ------------------------------------------------------------------------------------------------------------------  Cardiac Enzymes  Recent Labs Lab 09/14/15 0656  TROPONINI <0.03   ------------------------------------------------------------------------------------------------------------------  RADIOLOGY:  Ct Head Wo Contrast  09/15/2015  CLINICAL DATA:  Multiple recent falls. EXAM: CT HEAD WITHOUT CONTRAST TECHNIQUE: Contiguous axial images were obtained from the base of the skull through the vertex without intravenous contrast. COMPARISON:  09/13/2015 FINDINGS: No acute intracranial abnormality. Specifically, no hemorrhage, hydrocephalus, mass lesion, acute infarction, or significant intracranial injury. No acute calvarial abnormality. There is atrophy and chronic small vessel disease changes.  Visualized  paranasal sinuses and mastoids clear. Orbital soft tissues unremarkable. IMPRESSION: No acute intracranial abnormality. Electronically Signed   By: Rolm Baptise M.D.   On: 09/15/2015 14:16     ASSESSMENT AND PLAN:   81 year old female with past medical history of hypertension, aortic stenosis, history of diastolic CHF, stress incontinence, osteoarthritis, coronary disease, hyperlipidemia, fibromyalgia, morbid obesity, neuropathy, who presented to the hospital due to generalized weakness and frequent falls.  #1 generalized weakness/frequent falls-likely related to her UTI and also deconditioning.   -Continue supportive care with IV antibiotics for the UTI but slow to improve.  - seen by PT this a.m. and they recommend SNF and likely discharge there in the next few days.   #2 UTI-continue IV ceftriaxone.  Urine growing E. Coli which is pan-sensitive.   #3 neuropathy/Fibromyalgia-continue gabapentin.  #4 chronic pain-continue Norco.  #5 hypertension-continue Norvasc, metoprolol, losartan.  #6 GERD-continue Protonix.  Will likely d/c to SNF in the next 1-2 days.    All the records are reviewed and case discussed with Care Management/Social Workerr. Management plans discussed with the patient, family and they are in agreement.  CODE STATUS: Full  DVT Prophylaxis: Lovenox  TOTAL TIME TAKING CARE OF THIS PATIENT: 25 minutes.   POSSIBLE D/C IN 1-2 DAYS, DEPENDING ON CLINICAL CONDITION.   Henreitta Leber M.D on 09/16/2015 at 11:32 AM  Between 7am to 6pm - Pager - [phone]  After 6pm go to [email or web address] - password [email or web address] [facility]  Office  [phone]  CC: Primary care physician; [name]

## 2015-09-17 LAB — GLUCOSE, CAPILLARY
GLUCOSE-CAPILLARY: 100 mg/dL — AB (ref 65–99)
GLUCOSE-CAPILLARY: 117 mg/dL — AB (ref 65–99)
GLUCOSE-CAPILLARY: 181 mg/dL — AB (ref 65–99)
Glucose-Capillary: 125 mg/dL — ABNORMAL HIGH (ref 65–99)

## 2015-09-17 LAB — CREATININE, SERUM
CREATININE: 0.67 mg/dL (ref 0.44–1.00)
GFR calc Af Amer: >60 mL/min (ref >60)

## 2015-09-17 MED ORDER — CIPROFLOXACIN HCL 250 MG PO TABS: 250.0000 mg | ORAL_TABLET | Freq: Two times a day (BID) | ORAL | Status: DC

## 2015-09-17 MED ORDER — MAGNESIUM HYDROXIDE 400 MG/5ML PO SUSP
60.0000 mL | Freq: Once | ORAL | Status: AC
  Administered 2015-09-17: 60 mL via ORAL
  Filled 2015-09-17: qty 30

## 2015-09-17 NOTE — Progress Notes (Signed)
Patient in agreement for Clinical Social Worker to start bed search for SNF.  Referral faxed and bed offers presented to patient.  Patient discussed offers with her niece.   Provided CSW approval to discuss bed offers with niece, [name].  Call to [name] [phone] per [name], she went to visit [facility] and they would like to accept this bed offer.  CSW will notify Miquel Dunn of bed acceptance and continue to follow patient for disposition needs in anticipation of patient discharging to [facility].   Casimer Lanius. [place], MSW Clinical Social Work Department [phone] 9:14 AM

## 2015-09-17 NOTE — Discharge Summary (Addendum)
[facility] [place] NAME: Michele Schultz    MR#:  [id number]  DATE OF BIRTH:  04-Feb-1931  DATE OF ADMISSION:  09/13/2015 ADMITTING PHYSICIAN: Dustin Flock, MD  DATE OF DISCHARGE: 09/18/15  PRIMARY CARE PHYSICIAN: [name]    ADMISSION DIAGNOSIS:  Generalized weakness [R53.1]  DISCHARGE DIAGNOSIS:  Active Problems:   Generalized weakness   Chest pain   SECONDARY DIAGNOSIS:   Past Medical History  Diagnosis Date  . Hypertension   . Anemia   . Neuralgia, neuritis, and radiculitis, unspecified   . Coronary artery disease     a. s/p 2v CABG 11/12 (VG-LAD, VG-OM1); b. Lexiscan 08/2015: normal study  . Aortic stenosis, severe     a. s/p Magna Ease pericardial tissue valve size 21 mm replacement in 10/2011 for severe AS 11/12; b. echo 07/2015: EF 55-60% mod concentric LVH, GR1DD, LA mildly dilated, PASP 45 mm Hg  . Onychia and paronychia of toe   . Osteoarthrosis, unspecified whether generalized or localized, pelvic region and thigh     mainly in her back and knees  . Enthesopathy of hip region   . Esophageal reflux     followed by Dr.Seigal. stabilized with a combination of Nexium and Zantac  . Knee joint replacement by other means   . Stress incontinence, female     followed by Dr.Cope  . Complete heart block [facility])     a. s/p St Jude PPM 10/2011; b. SN # [id number]  . Cardiac pacemaker -st Judes     11/12  . Chronic diastolic heart failure ([place])     a. echo 2014: EF 55-60%, no RWMA, GR1DD, PASP 47 mm Hg; b. echo 2012L EF 55-60%, GR1DD, mild MR  . Type II or unspecified type diabetes mellitus without mention of complication, not stated as uncontrolled     pt. reports that she is borderline   . Spinal stenosis   . Neuropathy ([place])   . Morbid obesity ([place])   . RAD (reactive airway disease)     a. chronic SOB  . Fibromyalgia   . HLD (hyperlipidemia)     HOSPITAL COURSE:   81 year old female with past  medical history of hypertension, aortic stenosis, history of diastolic CHF, stress incontinence, osteoarthritis, coronary disease, hyperlipidemia, fibromyalgia, morbid obesity, neuropathy, who presented to the hospital due to generalized weakness and frequent falls.  #1 generalized weakness/frequent falls-patient initially presented with profound weakness and has had frequent falls at home. This is likely multifactorial but had an acute UTI which was contributing to her worsening weakness. Patient was admitted to the hospital started on IV ceftriaxone and after a few days of IV antibiotics are clinical symptoms have improved. She still continues to have significant weakness and was seen by physical therapy and thought she would benefit from short-term rehabilitation which is where she is presently being discharged.  #2 UTI-patient was initially treated with IV ceftriaxone and is now being discharged on oral ciprofloxacin. Urine culture grew out Escherichia coli which was pansensitive. Patient is to continue ciprofloxacin to complete course  #3 neuropathy/Fibromyalgia-patient will continue gabapentin, now doses advance to 300 mg twice daily dose, watching for somnolence, advanced doses if needed or changed to Lyrica if no significant effect with gabapentin.  #4 chronic pain-patient will continue Norco, gabapentin may benefit from amitriptyline if no significant somnolence.  #5 hypertension-patient will continue Norvasc, metoprolol, losartan. Blood pressure is well controlled  #6 GERD-patient will continue Protonix.  DISCHARGE  CONDITIONS:   Stable  CONSULTS OBTAINED:  Treatment Team:  Dustin Flock, MD  DRUG ALLERGIES:   Allergies  Allergen Reactions  . Citalopram Other (See Comments)    Reaction:  Altered mental status   . Cymbalta [Duloxetine Hcl] Other (See Comments)    Reaction:  Sedative for pt   . Imipramine Other (See Comments)    Reaction:  Unknown   . Proton Pump Inhibitors  Other (See Comments)    Reaction:  Unknown     DISCHARGE MEDICATIONS:   Current Discharge Medication List    START taking these medications   Details  ciprofloxacin (CIPRO) 250 MG tablet Take 1 tablet (250 mg total) by mouth 2 (two) times daily. Qty: 6 tablet, Refills: 0      CONTINUE these medications which have NOT CHANGED   Details  albuterol-ipratropium (COMBIVENT) 18-103 MCG/ACT inhaler Inhale 2 puffs into the lungs every 6 (six) hours as needed for wheezing. Qty: 1 Inhaler, Refills: 4    amLODipine (NORVASC) 5 MG tablet Take 1 tablet (5 mg total) by mouth daily. Qty: 30 tablet, Refills: 6    aspirin EC 81 MG tablet Take 81 mg by mouth daily.    azelastine (ASTELIN) 0.1 % nasal spray Place 1 spray into both nostrils 2 (two) times daily.    benzonatate (TESSALON) 100 MG capsule Take 200 mg by mouth 3 (three) times daily as needed for cough.     cyanocobalamin (,VITAMIN B-12,) 1000 MCG/ML injection Inject 1,000 mcg into the muscle every 30 (thirty) days.    cycloSPORINE (RESTASIS) 0.05 % ophthalmic emulsion Place 1 drop into both eyes 2 (two) times daily.     furosemide (LASIX) 20 MG tablet Take 1 tablet (20 mg total) by mouth every other day. Qty: 30 tablet, Refills: 6    gabapentin (NEURONTIN) 100 MG capsule Take 200 mg by mouth at bedtime.   Associated Diagnoses: Cardiac pacemaker in situ    HYDROcodone-acetaminophen (NORCO) 10-325 MG per tablet Take 1 tablet by mouth every 4 (four) hours as needed for moderate pain.     losartan (COZAAR) 25 MG tablet Take 25 mg by mouth daily.    magnesium hydroxide (MILK OF MAGNESIA) 400 MG/5ML suspension Take 30 mLs by mouth daily as needed for mild constipation.     metoprolol succinate (TOPROL-XL) 50 MG 24 hr tablet Take 50 mg by mouth daily.    nitroGLYCERIN (NITROSTAT) 0.4 MG SL tablet Place 1 tablet (0.4 mg total) under the tongue every 5 (five) minutes as needed for chest pain. Qty: 90 tablet, Refills: 3    pantoprazole  (PROTONIX) 40 MG tablet Take 40 mg by mouth 2 (two) times daily.      phenazopyridine (PYRIDIUM) 200 MG tablet Take 1 tablet (200 mg total) by mouth 3 (three) times daily as needed for pain. Qty: 20 tablet, Refills: 0    potassium chloride (K-DUR) 10 MEQ tablet Take 10 mEq by mouth daily.    promethazine (PHENERGAN) 25 MG tablet Take 1 tablet (25 mg total) by mouth every 6 (six) hours as needed for nausea or vomiting. Qty: 30 tablet, Refills: 0         DISCHARGE INSTRUCTIONS:   DIET:  Cardiac diet  DISCHARGE CONDITION:  Stable  ACTIVITY:  Activity as tolerated  OXYGEN:  Home Oxygen: No.   Oxygen Delivery: room air  DISCHARGE LOCATION:  nursing home   If you experience worsening of your admission symptoms, develop shortness of breath, life threatening emergency, suicidal or  homicidal thoughts you must seek medical attention immediately by calling 911 or calling your MD immediately  if symptoms less severe.  You Must read complete instructions/literature along with all the possible adverse reactions/side effects for all the Medicines you take and that have been prescribed to you. Take any new Medicines after you have completely understood and accpet all the possible adverse reactions/side effects.   Please note  You were cared for by a hospitalist during your hospital stay. If you have any questions about your discharge medications or the care you received while you were in the hospital after you are discharged, you can call the unit and asked to speak with the hospitalist on call if the hospitalist that took care of you is not available. Once you are discharged, your primary care physician will handle any further medical issues. Please note that NO REFILLS for any discharge medications will be authorized once you are discharged, as it is imperative that you return to your primary care physician (or establish a relationship with a primary care physician if you do not have one)  for your aftercare needs so that they can reassess your need for medications and monitor your lab values.     Today   So has significant weakness but much improved since admission. Afebrile, hemodynamically stable.  VITAL SIGNS:  Blood pressure 131/57, pulse 71, temperature 98.1 F (36.7 C), temperature source Oral, resp. rate 19, height 5\' 4"  (1.626 m), weight 110.723 kg (244 lb 1.6 oz), SpO2 93 %.  I/O:   Intake/Output Summary (Last 24 hours) at 09/17/15 1511 Last data filed at 09/17/15 1306  Gross per 24 hour  Intake    480 ml  Output    700 ml  Net   -220 ml    PHYSICAL EXAMINATION:   GENERAL: 81 y.o.-year-old obese patient sitting up in chair in no acute distress.  EYES: Pupils equal, round, reactive to light and accommodation. No scleral icterus. Extraocular muscles intact.  HEENT: Head atraumatic, normocephalic. Oropharynx and nasopharynx clear.  NECK: Supple, no jugular venous distention. No thyroid enlargement, no tenderness.  LUNGS: Normal breath sounds bilaterally, no wheezing, rales, rhonchi. No use of accessory muscles of respiration.  CARDIOVASCULAR: S1, S2 normal. 2/6 systolic ejection murmur at the left sternal border, no rubs, or gallops.  ABDOMEN: Soft, nontender, nondistended. Bowel sounds present. No organomegaly or mass.  EXTREMITIES: No cyanosis, clubbing or edema b/l.  NEUROLOGIC: Cranial nerves II through XII are intact. No focal Motor or sensory deficits b/l. Globally weak PSYCHIATRIC: The patient is alert and oriented x 3. Good affect SKIN: No obvious rash, lesion, or ulcer.    DATA REVIEW:   CBC  Recent Labs Lab 09/14/15 0656  WBC 6.6  HGB 11.2*  HCT 34.3*  PLT 240    Chemistries   Recent Labs Lab 09/14/15 0656 09/17/15 0557  NA 136  --   K 3.9  --   CL 104  --   CO2 25  --   GLUCOSE 132*  --   BUN 12  --   CREATININE 0.64 0.67  CALCIUM 8.7*  --     Cardiac Enzymes  Recent Labs Lab 09/14/15 0656  TROPONINI  <0.03    Microbiology Results  Results for orders placed or performed during the hospital encounter of 09/13/15  MRSA PCR Screening     Status: None   Collection Time: 09/13/15  9:30 PM  Result Value Ref Range Status   MRSA by PCR NEGATIVE NEGATIVE  Final    Comment:        The GeneXpert MRSA Assay (FDA approved for NASAL specimens only), is one component of a comprehensive MRSA colonization surveillance program. It is not intended to diagnose MRSA infection nor to guide or monitor treatment for MRSA infections.   Urine culture     Status: None   Collection Time: 09/14/15 11:25 AM  Result Value Ref Range Status   Specimen Description URINE, RANDOM  Final   Special Requests NONE  Final   Culture >=100,000 COLONIES/mL ESCHERICHIA COLI  Final   Report Status 09/16/2015 FINAL  Final   Organism ID, Bacteria ESCHERICHIA COLI  Final      Susceptibility   Escherichia coli - MIC*    AMPICILLIN <=2 SENSITIVE Sensitive     CEFTAZIDIME <=1 SENSITIVE Sensitive     CEFAZOLIN <=4 SENSITIVE Sensitive     CEFTRIAXONE <=1 SENSITIVE Sensitive     CIPROFLOXACIN <=0.25 SENSITIVE Sensitive     GENTAMICIN <=1 SENSITIVE Sensitive     IMIPENEM <=0.25 SENSITIVE Sensitive     TRIMETH/SULFA <=20 SENSITIVE Sensitive     NITROFURANTOIN Value in next row Sensitive      SENSITIVE<=16    PIP/TAZO Value in next row Sensitive      SENSITIVE<=4    ERTAPENEM Value in next row Sensitive      SENSITIVE<=0.5    LEVOFLOXACIN Value in next row Sensitive      SENSITIVE<=0.12    * >=100,000 COLONIES/mL ESCHERICHIA COLI    RADIOLOGY:  No results found.    Management plans discussed with the patient, family and they are in agreement.  CODE STATUS:     Code Status Orders        Start     Ordered   09/13/15 1852  Full code   Continuous     09/13/15 1851    Advance Directive Documentation        Most Recent Value   Type of Advance Directive  Healthcare Power of Attorney, Out of facility DNR (pink  MOST or yellow form)   Pre-existing out of facility DNR order (yellow form or pink MOST form)     "MOST" Form in Place?        TOTAL TIME TAKING CARE OF THIS PATIENT: 40 minutes.    Henreitta Leber M.D on 09/17/2015 at 3:11 PM  Between 7am to 6pm - Pager - [phone]  After 6pm go to [email or web address] - password [email or web address] [facility]  Office  [phone]  CC: Primary care physician; [name]

## 2015-09-17 NOTE — Progress Notes (Signed)
Pt requesting 60 ml MOM, states she's been taking 60 ml at bedtime for 30 years. MD, Volanda Napoleon contacted, order received for 60 ml Milk of Mag. PO once.

## 2015-09-17 NOTE — Clinical Social Work Placement (Signed)
   CLINICAL SOCIAL WORK PLACEMENT  NOTE  Date:  09/17/2015  Patient Details  Name: Michele Schultz MRN: [id number] Date of Birth: February 01, 1931  Clinical Social Work is seeking post-discharge placement for this patient at the [place] level of care (*CSW will initial, date and re-position this form in  chart as items are completed):  Yes   Patient/family provided with [facility] Work Department's list of facilities offering this level of care within the geographic area requested by the patient (or if unable, by the patient's family).  Yes   Patient/family informed of their freedom to choose among providers that offer the needed level of care, that participate in Medicare, Medicaid or managed care program needed by the patient, have an available bed and are willing to accept the patient.  Yes   Patient/family informed of [facility]'s ownership interest in [facility] and [facility], as well as of the fact that they are under no obligation to receive care at these facilities.  PASRR submitted to EDS on       PASRR number received on       Existing PASRR number confirmed on 09/16/15     FL2 transmitted to all facilities in geographic area requested by pt/family on 09/16/15     FL2 transmitted to all facilities within larger geographic area on       Patient informed that his/her managed care company has contracts with or will negotiate with certain facilities, including the following:        Yes   Patient/family informed of bed offers received.  Patient chooses bed at  [facility])     Physician recommends and patient chooses bed at      Patient to be transferred to   on  .  Patient to be transferred to facility by       Patient family notified on   of transfer.  Name of family member notified:        PHYSICIAN Please sign FL2     Additional Comment:    _______________________________________________ Maurine Cane,  LCSW 09/17/2015, 9:17 AM

## 2015-09-17 NOTE — Progress Notes (Signed)
[facility] at [place] NAME: Michele Schultz    MR#:  [id number]  DATE OF BIRTH:  1931-01-21  SUBJECTIVE:  CHIEF COMPLAINT:   Chief Complaint  Patient presents with  . Weakness   Patient here with generalized weakness and frequent falls. NO fever overnight.  Weakness improving.    REVIEW OF SYSTEMS:    Review of Systems  Constitutional: Negative for fever and chills.  HENT: Negative for congestion and tinnitus.   Eyes: Negative for blurred vision and double vision.  Respiratory: Negative for cough, shortness of breath and wheezing.   Cardiovascular: Negative for chest pain, orthopnea and PND.  Gastrointestinal: Negative for nausea, vomiting, abdominal pain and diarrhea.  Genitourinary: Negative for dysuria and hematuria.  Neurological: Positive for weakness (generalized. ). Negative for dizziness, sensory change and focal weakness.  All other systems reviewed and are negative.   Nutrition: Heart healthy Tolerating Diet: Yes Tolerating PT: Evaluation noted   DRUG ALLERGIES:   Allergies  Allergen Reactions  . Citalopram Other (See Comments)    Reaction:  Altered mental status   . Cymbalta [Duloxetine Hcl] Other (See Comments)    Reaction:  Sedative for pt   . Imipramine Other (See Comments)    Reaction:  Unknown   . Proton Pump Inhibitors Other (See Comments)    Reaction:  Unknown     VITALS:  Blood pressure 131/57, pulse 71, temperature 98.1 F (36.7 C), temperature source Oral, resp. rate 19, height 5\' 4"  (1.626 m), weight 110.723 kg (244 lb 1.6 oz), SpO2 93 %.  PHYSICAL EXAMINATION:   Physical Exam  GENERAL:  81 y.o.-year-old obese patient sitting up in chair in no acute distress.  EYES: Pupils equal, round, reactive to light and accommodation. No scleral icterus. Extraocular muscles intact.  HEENT: Head atraumatic, normocephalic. Oropharynx and nasopharynx clear.  NECK:  Supple, no jugular venous distention.  No thyroid enlargement, no tenderness.  LUNGS: Normal breath sounds bilaterally, no wheezing, rales, rhonchi. No use of accessory muscles of respiration.  CARDIOVASCULAR: S1, S2 normal. 2/6 systolic ejection murmur at the left sternal border, no rubs, or gallops.  ABDOMEN: Soft, nontender, nondistended. Bowel sounds present. No organomegaly or mass.  EXTREMITIES: No cyanosis, clubbing or edema b/l.    NEUROLOGIC: Cranial nerves II through XII are intact. No focal Motor or sensory deficits b/l.  Globally weak PSYCHIATRIC: The patient is alert and oriented x 3. Good affect SKIN: No obvious rash, lesion, or ulcer.    LABORATORY PANEL:   CBC  Recent Labs Lab 09/14/15 0656  WBC 6.6  HGB 11.2*  HCT 34.3*  PLT 240   ------------------------------------------------------------------------------------------------------------------  Chemistries   Recent Labs Lab 09/14/15 0656 09/17/15 0557  NA 136  --   K 3.9  --   CL 104  --   CO2 25  --   GLUCOSE 132*  --   BUN 12  --   CREATININE 0.64 0.67  CALCIUM 8.7*  --    ------------------------------------------------------------------------------------------------------------------  Cardiac Enzymes  Recent Labs Lab 09/14/15 0656  TROPONINI <0.03   ------------------------------------------------------------------------------------------------------------------  RADIOLOGY:  No results found.   ASSESSMENT AND PLAN:   81 year old female with past medical history of hypertension, aortic stenosis, history of diastolic CHF, stress incontinence, osteoarthritis, coronary disease, hyperlipidemia, fibromyalgia, morbid obesity, neuropathy, who presented to the hospital due to generalized weakness and frequent falls.  #1 generalized weakness/frequent falls-likely related to her UTI and also deconditioning.   -remains on IV abx and  will switch to oral in a.m. Tomorrow upon discharge.   - appreciate PT input and d/c to SNF tomorrow.      #2 UTI-continue IV ceftriaxone.  Urine growing E. Coli which is pan-sensitive. - switch to Oral Cipro upon discharge.    #3 neuropathy/Fibromyalgia-continue gabapentin.  #4 chronic pain-continue Norco.  #5 hypertension-continue Norvasc, metoprolol, losartan.  #6 GERD-continue Protonix.  Will likely d/c to SNF tomorrow a.m.   All the records are reviewed and case discussed with Care Management/Social Workerr. Management plans discussed with the patient, family and they are in agreement.  CODE STATUS: Full  DVT Prophylaxis: Lovenox  TOTAL TIME TAKING CARE OF THIS PATIENT: 25 minutes.   POSSIBLE D/C IN 1-2 DAYS, DEPENDING ON CLINICAL CONDITION.   Henreitta Leber M.D on 09/17/2015 at 3:09 PM  Between 7am to 6pm - Pager - [phone]  After 6pm go to [email or web address] - password [email or web address] [facility]  Office  [phone]  CC: Primary care physician; [name]

## 2015-09-17 NOTE — Care Management Important Message (Signed)
Important Message  Patient Details  Name: Michele Schultz MRN: [id number] Date of Birth: Mar 07, 1931   Medicare Important Message Given:  Yes-second notification given    [name] 09/17/2015, 2:07 PM

## 2015-09-17 NOTE — Care Management (Signed)
Anticipated discharge to skilled nursing 10/15

## 2015-09-18 LAB — GLUCOSE, CAPILLARY
GLUCOSE-CAPILLARY: 111 mg/dL — AB (ref 65–99)
Glucose-Capillary: 139 mg/dL — ABNORMAL HIGH (ref 65–99)

## 2015-09-18 MED ORDER — MINERAL OIL RE ENEM
1.0000 | ENEMA | Freq: Once | RECTAL | Status: AC
  Administered 2015-09-18: 1 via RECTAL

## 2015-09-18 MED ORDER — GABAPENTIN 100 MG PO CAPS: 300.0000 mg | ORAL_CAPSULE | Freq: Two times a day (BID) | ORAL | Status: DC

## 2015-09-18 NOTE — Clinical Social Work Placement (Signed)
   CLINICAL SOCIAL WORK PLACEMENT  NOTE  Date:  09/18/2015  Patient Details  Name: Michele Schultz MRN: [id number] Date of Birth: 10-31-1931  Clinical Social Work is seeking post-discharge placement for this patient at the [place] level of care (*CSW will initial, date and re-position this form in  chart as items are completed):  Yes   Patient/family provided with [facility] Work Department's list of facilities offering this level of care within the geographic area requested by the patient (or if unable, by the patient's family).  Yes   Patient/family informed of their freedom to choose among providers that offer the needed level of care, that participate in Medicare, Medicaid or managed care program needed by the patient, have an available bed and are willing to accept the patient.  Yes   Patient/family informed of [facility]'s ownership interest in [facility] and [facility], as well as of the fact that they are under no obligation to receive care at these facilities.  PASRR submitted to EDS on       PASRR number received on       Existing PASRR number confirmed on 09/16/15     FL2 transmitted to all facilities in geographic area requested by pt/family on 09/16/15     FL2 transmitted to all facilities within larger geographic area on       Patient informed that his/her managed care company has contracts with or will negotiate with certain facilities, including the following:        Yes   Patient/family informed of bed offers received.  Patient chooses bed at  [facility])     Physician recommends and patient chooses bed at  [facility])    Patient to be transferred to  [facility]) on 09/18/15.  Patient to be transferred to facility by  (EMS)     Patient family notified on 09/18/15 of transfer.  Name of family member notified:   (neice: phyllis)     PHYSICIAN Please sign FL2     Additional Comment:     _______________________________________________ Shela Leff, LCSW 09/18/2015, 1:52 PM

## 2015-09-18 NOTE — Progress Notes (Signed)
Patient is being discharge to SNF  In a stable condition report given to floor nurse and ambulance call , awaiting to be pick up.

## 2015-09-18 NOTE — Progress Notes (Signed)
[facility] NAME: Michele Schultz    MR#:  [id number]  DATE OF BIRTH:  Aug 23, 1931  SUBJECTIVE:  CHIEF COMPLAINT:   Chief Complaint  Patient presents with  . Weakness   Patient here with generalized weakness and frequent falls. NO fever overnight.  Weakness improving. Patient complains of generalized pain due to fibromyalgia. Patient would like to have her gabapentin dose advanced or change to Lyrica.    REVIEW OF SYSTEMS:    Review of Systems  Constitutional: Negative for fever and chills.  HENT: Negative for congestion and tinnitus.   Eyes: Negative for blurred vision and double vision.  Respiratory: Negative for cough, shortness of breath and wheezing.   Cardiovascular: Negative for chest pain, orthopnea and PND.  Gastrointestinal: Negative for nausea, vomiting, abdominal pain and diarrhea.  Genitourinary: Negative for dysuria and hematuria.  Neurological: Positive for weakness (generalized. ). Negative for dizziness, sensory change and focal weakness.  All other systems reviewed and are negative.   Nutrition: Heart healthy Tolerating Diet: Yes Tolerating PT: Evaluation noted   DRUG ALLERGIES:   Allergies  Allergen Reactions  . Citalopram Other (See Comments)    Reaction:  Altered mental status   . Cymbalta [Duloxetine Hcl] Other (See Comments)    Reaction:  Sedative for pt   . Imipramine Other (See Comments)    Reaction:  Unknown   . Proton Pump Inhibitors Other (See Comments)    Reaction:  Unknown     VITALS:  Blood pressure 132/41, pulse 76, temperature 98.2 F (36.8 C), temperature source Oral, resp. rate 18, height 5\' 4"  (1.626 m), weight 110.723 kg (244 lb 1.6 oz), SpO2 97 %.  PHYSICAL EXAMINATION:   Physical Exam  GENERAL:  81 y.o.-year-old obese patient sitting up in chair in no acute distress.  EYES: Pupils equal, round, reactive to light and accommodation. No scleral icterus. Extraocular  muscles intact.  HEENT: Head atraumatic, normocephalic. Oropharynx and nasopharynx clear.  NECK:  Supple, no jugular venous distention. No thyroid enlargement, no tenderness.  LUNGS: Normal breath sounds bilaterally, no wheezing, rales, rhonchi. No use of accessory muscles of respiration.  CARDIOVASCULAR: S1, S2 normal. 2/6 systolic ejection murmur at the left sternal border, no rubs, or gallops.  ABDOMEN: Soft, nontender, nondistended. Bowel sounds present. No organomegaly or mass.  EXTREMITIES: No cyanosis, clubbing or edema b/l.    NEUROLOGIC: Cranial nerves II through XII are intact. No focal Motor or sensory deficits b/l.  Globally weak PSYCHIATRIC: The patient is alert and oriented x 3. Good affect SKIN: No obvious rash, lesion, or ulcer.    LABORATORY PANEL:   CBC  Recent Labs Lab 09/14/15 0656  WBC 6.6  HGB 11.2*  HCT 34.3*  PLT 240   ------------------------------------------------------------------------------------------------------------------  Chemistries   Recent Labs Lab 09/14/15 0656 09/17/15 0557  NA 136  --   K 3.9  --   CL 104  --   CO2 25  --   GLUCOSE 132*  --   BUN 12  --   CREATININE 0.64 0.67  CALCIUM 8.7*  --    ------------------------------------------------------------------------------------------------------------------  Cardiac Enzymes  Recent Labs Lab 09/14/15 0656  TROPONINI <0.03   ------------------------------------------------------------------------------------------------------------------  RADIOLOGY:  No results found.   ASSESSMENT AND PLAN:   81 year old female with past medical history of hypertension, aortic stenosis, history of diastolic CHF, stress incontinence, osteoarthritis, coronary disease, hyperlipidemia, fibromyalgia, morbid obesity, neuropathy, who presented to the hospital due to generalized weakness and  frequent falls.  #1 generalized weakness/frequent falls-likely related to UTI and deconditioning,  generalized pain.     - appreciate PT input and d/c to SNF today. Advance her gabapentin were changed to Lyrica if significant somnolence with gabapentin or if gabapentin is not effective.     #2 . Urinary tract infection due to Escherichia coli , pansensitive, Discontinue IV ceftriaxone.  Continue Oral Cipro upon discharge to complete course.    #3 neuropathy/Fibromyalgia- advance gabapentin to 300 mg twice daily, watch for somnolence and advance this medication if needed or change to Lyrica.  #4 chronic pain-continue Norco, gabapentin, may benefit from amitriptyline at nighttime if no significant somnolence with gabapentin on Norco.  #5 hypertension-continue Norvasc, metoprolol, losartan. Blood pressure is relatively well controlled  #6 GERD-continue Protonix.   d/c to SNF today   All the records are reviewed and case discussed with Care Management/Social Workerr. Management plans discussed with the patient, family and they are in agreement.  CODE STATUS: Full  DVT Prophylaxis: Lovenox  TOTAL TIME TAKING CARE OF THIS PATIENT: 40 minutes.   Coordination of care. Approximately 10  minutes.   Theodoro Grist M.D on 09/18/2015 at 11:06 AM  Between 7am to 6pm - Pager - [phone]  After 6pm go to [email or web address] - password [email or web address] [facility]  Office  [phone]  CC: Primary care physician; Michele Schultz

## 2015-09-18 NOTE — Clinical Social Work Note (Signed)
Patient to discharge today to [facility] as planned. Crystal at [facility] is aware and nurse to call report. Patient wishes to go via EMS. [name]: Silva Bandy is aware and is also aware that EMS may take a while to transport due them having a heavy transport list today. Shela Leff MSW,LCSW [phone]

## 2015-09-18 NOTE — Progress Notes (Signed)
Pt was c/o constipation after having 2 BM's in less than an hour. Pt requested to be given an enema. MD, Jannifer Franklin contacted, order for enema obtained and administered.  After the administration of the enema, pt was requesting suppository to aid with her bowels, explained to pt that we had given an enema and we needed to give it time to work. Pt was able to have a third BM minutes later. No further complaints for now. Will continue to monitor.

## 2015-09-18 NOTE — Care Management Note (Signed)
Case Management Note  Patient Details  Name: Michele Schultz MRN: [id number] Date of Birth: 12-08-1930  Subjective/Objective:       Discussed discharge with Shela Leff, CSW. Michele Schultz will be discharged to [facility] place.             Action/Plan:   Expected Discharge Date:                  Expected Discharge Plan:     In-House Referral:     Discharge planning Services     Post Acute Care Choice:    Choice offered to:     DME Arranged:    DME Agency:     HH Arranged:    [place] Agency:     Status of Service:     Medicare Important Message Given:  Yes-second notification given Date Medicare IM Given:    Medicare IM give by:    Date Additional Medicare IM Given:    Additional Medicare Important Message give by:     If discussed at [place] of Stay Meetings, dates discussed:    Additional Comments:  Michele Romanello A, RN 09/18/2015, 1:38 PM

## 2015-09-21 ENCOUNTER — Non-Acute Institutional Stay (SKILLED_NURSING_FACILITY): Payer: Medicare Other | Admitting: Internal Medicine

## 2015-09-21 DIAGNOSIS — G8929 Other chronic pain: Secondary | ICD-10-CM | POA: Diagnosis not present

## 2015-09-21 DIAGNOSIS — I25810 Atherosclerosis of coronary artery bypass graft(s) without angina pectoris: Secondary | ICD-10-CM

## 2015-09-21 DIAGNOSIS — B9620 Unspecified Escherichia coli [E. coli] as the cause of diseases classified elsewhere: Secondary | ICD-10-CM | POA: Diagnosis not present

## 2015-09-21 DIAGNOSIS — R2681 Unsteadiness on feet: Secondary | ICD-10-CM

## 2015-09-21 DIAGNOSIS — K5901 Slow transit constipation: Secondary | ICD-10-CM | POA: Diagnosis not present

## 2015-09-21 DIAGNOSIS — M797 Fibromyalgia: Secondary | ICD-10-CM

## 2015-09-21 DIAGNOSIS — I10 Essential (primary) hypertension: Secondary | ICD-10-CM | POA: Diagnosis not present

## 2015-09-21 DIAGNOSIS — N390 Urinary tract infection, site not specified: Secondary | ICD-10-CM

## 2015-09-21 DIAGNOSIS — I5032 Chronic diastolic (congestive) heart failure: Secondary | ICD-10-CM

## 2015-09-21 DIAGNOSIS — K219 Gastro-esophageal reflux disease without esophagitis: Secondary | ICD-10-CM | POA: Diagnosis not present

## 2015-09-21 DIAGNOSIS — R531 Weakness: Secondary | ICD-10-CM

## 2015-09-21 NOTE — Progress Notes (Signed)
Patient ID: Michele Schultz, female   DOB: April 02, 1931, 81 y.o.   MRN: [id number]  Opened in error

## 2015-09-23 NOTE — Progress Notes (Signed)
Patient ID: Michele Schultz, female   DOB: 1931-11-25, 81 y.o.   MRN: [id number]     [place]  PCP: [name]  Code Status: Full Code   Allergies  Allergen Reactions  . Citalopram Other (See Comments)    Reaction:  Altered mental status   . Cymbalta [Duloxetine Hcl] Other (See Comments)    Reaction:  Sedative for pt   . Imipramine Other (See Comments)    Reaction:  Unknown   . Proton Pump Inhibitors Other (See Comments)    Reaction:  Unknown     Chief Complaint  Patient presents with  . New Admit To SNF     New admission      HPI:  81 y.o. patient is here for short term rehabilitation post hospital admission from 09/13/15-09/18/15 with generalized weakness and falls. She was diagnosed to have e.coli uti and was started on antibiotics. Her gabapentin dosing was increased for fibromyalgia. She is to complete her ciprofloxacin today. She has PMH of HTN, diastolic CHF, AS, OA, CAD and morbid obesity among others. She is seen in her room today. She would like her milk of magnesia dosing scheduled for her constipation. She feels that her strength is improving. Denies any concerns. Her fibromyalgia pain under control at present  Review of Systems:  Constitutional: Negative for fever, chills, diaphoresis.  HENT: Negative for headache, congestion, nasal discharge Eyes: Negative for eye pain, blurred vision, double vision and discharge.  Respiratory: Negative for cough, shortness of breath and wheezing.   Cardiovascular: Negative for chest pain, palpitations. Positive for leg swelling.  Gastrointestinal: Negative for heartburn, nausea, vomiting, abdominal pain Genitourinary: Negative for dysuria, flank pain.  Musculoskeletal: Negative for back pain, falls Skin: Negative for itching, rash.  Neurological: positive for generalized weakness.Negative for dizziness, tingling, focal weakness Psychiatric/Behavioral: Negative for depression    Past Medical  History  Diagnosis Date  . Hypertension   . Anemia   . Neuralgia, neuritis, and radiculitis, unspecified   . Coronary artery disease     a. s/p 2v CABG 11/12 (VG-LAD, VG-OM1); b. Lexiscan 08/2015: normal study  . Aortic stenosis, severe     a. s/p Magna Ease pericardial tissue valve size 21 mm replacement in 10/2011 for severe AS 11/12; b. echo 07/2015: EF 55-60% mod concentric LVH, GR1DD, LA mildly dilated, PASP 45 mm Hg  . Onychia and paronychia of toe   . Osteoarthrosis, unspecified whether generalized or localized, pelvic region and thigh     mainly in her back and knees  . Enthesopathy of hip region   . Esophageal reflux     followed by Dr.Seigal. stabilized with a combination of Nexium and Zantac  . Knee joint replacement by other means   . Stress incontinence, female     followed by Dr.Cope  . Complete heart block [facility])     a. s/p St Jude PPM 10/2011; b. SN # [id number]  . Cardiac pacemaker -st Judes     11/12  . Chronic diastolic heart failure ([place])     a. echo 2014: EF 55-60%, no RWMA, GR1DD, PASP 47 mm Hg; b. echo 2012L EF 55-60%, GR1DD, mild MR  . Type II or unspecified type diabetes mellitus without mention of complication, not stated as uncontrolled     pt. reports that she is borderline   . Spinal stenosis   . Neuropathy ([place])   . Morbid obesity ([place])   . RAD (reactive airway disease)  a. chronic SOB  . Fibromyalgia   . HLD (hyperlipidemia)    Past Surgical History  Procedure Laterality Date  . Replacement total knee  11/2006    right knee  . Nasal sinus surgery  2009  . Total hip arthroplasty  09/2010  . Cardiac catheterization  08/2009    50% stenosis distal left main, 50% stenosis ostial left circumflex.   . Cardiac catheterization      at [facility]  . Eye surgery      IOL/ after cataracts removed- bilateral   . Aortic valve replacement  10/17/2011    Procedure: AORTIC VALVE REPLACEMENT (AVR);  Surgeon: Melrose Nakayama, MD;  Location: [facility];  Service: Open  Heart Surgery;  Laterality: N/A;  . Coronary artery bypass graft  10/17/2011    Procedure: CORONARY ARTERY BYPASS GRAFTING (CABG);  Surgeon: Melrose Nakayama, MD;  Location: [facility];  Service: Open Heart Surgery;  Laterality: N/A;  Times Two, using right leg greater saphenous vein harvested endoscopically  . Permanent pacemaker insertion N/A 10/20/2011    Procedure: PERMANENT PACEMAKER INSERTION;  Surgeon: Thompson Grayer, MD;  Location: [facility] CATH LAB;  Service: Cardiovascular;  Laterality: N/A;   Social History:   reports that she has never smoked. She has never used smokeless tobacco. She reports that she does not drink alcohol or use illicit drugs.  Family History  Problem Relation Age of Onset  . Diabetes Other   . Heart disease Sister   . Heart disease Brother   . Heart disease Brother   . Heart disease Brother   . Anesthesia problems Neg Hx   . Hypotension Neg Hx   . Malignant hyperthermia Neg Hx   . Pseudochol deficiency Neg Hx     Medications:   Medication List       This list is accurate as of: 09/21/15 11:59 PM.  Always use your most recent med list.               albuterol-ipratropium 18-103 MCG/ACT inhaler  Commonly known as:  COMBIVENT  Inhale 2 puffs into the lungs every 6 (six) hours as needed for wheezing.     amLODipine 5 MG tablet  Commonly known as:  NORVASC  Take 1 tablet (5 mg total) by mouth daily.     aspirin EC 81 MG tablet  Take 81 mg by mouth daily.     azelastine 0.1 % nasal spray  Commonly known as:  ASTELIN  Place 1 spray into both nostrils 2 (two) times daily.     benzonatate 100 MG capsule  Commonly known as:  TESSALON  Take 200 mg by mouth 3 (three) times daily as needed for cough.     cyanocobalamin 1000 MCG/ML injection  Commonly known as:  (VITAMIN B-12)  Inject 1,000 mcg into the muscle every 30 (thirty) days.     cycloSPORINE 0.05 % ophthalmic emulsion  Commonly known as:  RESTASIS  Place 1 drop into both eyes 2 (two) times  daily.     furosemide 20 MG tablet  Commonly known as:  LASIX  Take 1 tablet (20 mg total) by mouth every other day.     gabapentin 300 MG capsule  Commonly known as:  NEURONTIN  Take 300 mg by mouth 2 (two) times daily.     HYDROcodone-acetaminophen 10-325 MG tablet  Commonly known as:  NORCO  Take 1 tablet by mouth every 4 (four) hours as needed for moderate pain.     losartan 25  MG tablet  Commonly known as:  COZAAR  Take 25 mg by mouth daily.     magnesium hydroxide 400 MG/5ML suspension  Commonly known as:  MILK OF MAGNESIA  Take 30 mLs by mouth daily as needed for mild constipation.     metoprolol succinate 50 MG 24 hr tablet  Commonly known as:  TOPROL-XL  Take 50 mg by mouth daily.     nitroGLYCERIN 0.4 MG SL tablet  Commonly known as:  NITROSTAT  Place 1 tablet (0.4 mg total) under the tongue every 5 (five) minutes as needed for chest pain.     pantoprazole 40 MG tablet  Commonly known as:  PROTONIX  Take 40 mg by mouth 2 (two) times daily.     phenazopyridine 200 MG tablet  Commonly known as:  PYRIDIUM  Take 1 tablet (200 mg total) by mouth 3 (three) times daily as needed for pain.     potassium chloride 10 MEQ tablet  Commonly known as:  K-DUR  Take 10 mEq by mouth daily.     promethazine 25 MG tablet  Commonly known as:  PHENERGAN  Take 1 tablet (25 mg total) by mouth every 6 (six) hours as needed for nausea or vomiting.         Physical Exam: Filed Vitals:   09/21/15 1132  BP: 115/50  Pulse: 68  Temp: 97.2 F (36.2 C)  TempSrc: Oral  Resp: 20  SpO2: 95%    General- elderly female, morbidly obese, in no acute distress Head- normocephalic, atraumatic Nose- normal nasal mucosa, no maxillary or frontal sinus tenderness, no nasal discharge Throat- moist mucus membrane  Eyes- PERRLA, EOMI, no pallor, no icterus, no discharge, normal conjunctiva, normal sclera Neck- no cervical lymphadenopathy, no thyromegaly, no jugular vein  distension Cardiovascular- normal s1,s2, no murmurs, trace leg edema Respiratory- bilateral clear to auscultation, no wheeze, no rhonchi, no crackles, no use of accessory muscles Abdomen- bowel sounds present, soft, non tender Musculoskeletal- able to move all 4 extremities, limited ROM, on wheelchair, unsteady gait Neurological- no focal deficit, alert and oriented to person, place and time Skin- warm and dry Psychiatry- normal mood and affect    Labs reviewed: Basic Metabolic Panel:  Recent Labs  08/25/15 1552 09/13/15 1319 09/14/15 0656 09/17/15 0557  NA 138 138 136  --   K 4.5 3.9 3.9  --   CL 99 102 104  --   CO2 24 31 25   --   GLUCOSE 125* 105* 132*  --   BUN 12 16 12   --   CREATININE 0.68 0.67 0.64 0.67  CALCIUM 9.2 8.9 8.7*  --    Liver Function Tests:  Recent Labs  10/16/14 2102 10/20/14 0509 12/04/14 1413  AST 39* 23 26  ALT 43 29 18  ALKPHOS 113 83 92  BILITOT 0.3 0.3 0.8  PROT 7.2 6.4 7.9  ALBUMIN 2.5* 2.2* 3.5   No results for input(s): LIPASE, AMYLASE in the last 8760 hours. No results for input(s): AMMONIA in the last 8760 hours. CBC:  Recent Labs  10/16/14 2102  12/04/14 1413 08/25/15 1552 09/13/15 1319 09/14/15 0656  WBC 6.8  < > 9.8 6.6 6.7 6.6  NEUTROABS 4.9  --   --   --   --   --   HGB 11.4*  < > 11.9*  --  11.4* 11.2*  HCT 35.5  < > 38.0 36.4 35.9 34.3*  MCV 88  < > 88  --  81.2 83.2  PLT 271  < > 278  --  241 240  < > = values in this interval not displayed. Cardiac Enzymes:  Recent Labs  09/13/15 1319 09/13/15 2354 09/14/15 0656  TROPONINI <0.03 <0.03 <0.03   BNP: Invalid input(s): POCBNP CBG:  Recent Labs  09/17/15 2121 09/18/15 0733 09/18/15 1144  GLUCAP 181* 139* 111*    Radiological Exams: Dg Chest 1 View  09/13/2015  CLINICAL DATA:  Weakness. Four falls in the last 2 weeks. Low back pain. EXAM: CHEST 1 VIEW COMPARISON:  06/16/2013 FINDINGS: Dual lead pacer noted, lead positioning unchanged. Prior CABG.  Accounting for the AP projection, there is mild enlargement of the cardiopericardial silhouette. Tortuous thoracic aorta, stable.  No edema.  The lungs appear clear. IMPRESSION: 1. Mild enlargement of the cardiopericardial silhouette, without edema. 2. Prior CABG.  Dual lead pacer remains in place. 3. Stable tortuosity of the thoracic aorta. Electronically Signed   By: Van Clines M.D.   On: 09/13/2015 14:02   Dg Lumbar Spine Complete  09/13/2015  CLINICAL DATA:  Generalized weakness. For falls in the last 2 weeks. Low back pain. EXAM: LUMBAR SPINE - COMPLETE 4+ VIEW COMPARISON:  CT scan from 10/16/2014 FINDINGS: Bony demineralization. Mild sclerosis along the iliac side of both sacroiliac joints. Right hip implant. Degenerative spurring of the left femoral head. Right facet arthropathy at L4-5 and L5-S1. Aortoiliac atherosclerotic vascular disease. 3-4 mm of grade 1 degenerative anterolisthesis at L3-4 and L4-5, without pars defects observed. This is similar to the prior CT scan. No fracture or new subluxation is identified. IMPRESSION: 1. Reduced sensitivity due to bony demineralization. 2. No fracture or acute subluxation is identified. There is 3-4 mm of grade 1 degenerative anterolisthesis at L3-4 and L4-5. 3. Lower lumbar facet arthropathy. 4. Mild sclerosis along the iliac side of both sacroiliac joints. 5. Mild degenerative spurring of the left femoral head. Electronically Signed   By: Van Clines M.D.   On: 09/13/2015 14:05   Dg Pelvis 1-2 Views  09/13/2015  CLINICAL DATA:  Fall x4 in the last 2 weeks. Bilateral hip pain and low back pain. Generalized weakness. EXAM: PELVIS - 1-2 VIEW COMPARISON:  CT abdomen and pelvis, 10/16/2014 FINDINGS: No acute fracture. Right hip prosthesis appears well seated and aligned. Arthropathic changes of the right hip with mild concentric hip joint space narrowing and marginal osteophytes from the femoral head are stable. Bones are demineralized. SI  joints and symphysis pubis are normally aligned. Iliac artery vascular calcifications are noted in the upper pelvis. IMPRESSION: No fracture or acute finding. Electronically Signed   By: Lajean Manes M.D.   On: 09/13/2015 14:07   Ct Head Wo Contrast  09/13/2015  CLINICAL DATA:  Generalized weakness.  Multiple recent falls. EXAM: CT HEAD WITHOUT CONTRAST TECHNIQUE: Contiguous axial images were obtained from the base of the skull through the vertex without intravenous contrast. COMPARISON:  10/23/2014 head CT. FINDINGS: No evidence of parenchymal hemorrhage or extra-axial fluid collection. No mass lesion, mass effect, or midline shift. No CT evidence of acute infarction. There is intracranial atherosclerotic arterial calcification. Nonspecific stable moderate subcortical and periventricular white matter hypodensity, most in keeping with chronic small vessel ischemic change. There is stable diffuse cerebral volume loss. Stable ventricles, the size of which are concordant with the degree of cerebral volume loss. The visualized paranasal sinuses are essentially clear. The mastoid air cells are unopacified. No evidence of calvarial fracture. IMPRESSION: 1. No acute intracranial abnormality.  No calvarial fracture. 2.  Intracranial atherosclerosis, diffuse cerebral volume loss and nonspecific chronic small vessel ischemic white matter change, stable. Electronically Signed   By: Ilona Sorrel M.D.   On: 09/13/2015 14:21    Assessment/Plan  Generalized weakness With recent infection and her ongoing fibromyalagia with chronic pain syndrome. Will have her work with physical therapy and occupational therapy team to help with gait training and muscle strengthening exercises.fall precautions. Skin care. Encourage to be out of bed.   Unsteady gait Leading to falls. Her neuropathy and obesity with OA could be contributing to this. Continue b12 supplement and gabapentin. Will have patient work with PT/OT as tolerated to  regain strength and restore function.  Fall precautions are in place.  E.coli uti To complete her ciprofloxacin course today, currently asymptomatic. Monitor clinically  Fibromyalgia Continue gabapentin 300 mg bid for now  Chronic diastolic chf Continue cozaar 25 mg daily and toprol xl 50 mg daily. Continue lasix 20 mg qod and monitor bmp. Continue kcl supplement  HTN Stable bp, continue norvasc 5 mg daily, cozaar 25 mg daily and toprol 50 mg daily  CAD Remains chest pain free. Continue baby aspirin, ACEI and b blocker with prn NTG  Constipation change milk of magnesia to 45 cc daily for now and monitor  gerd Continue protonix 40 mg bid, sympotms currently controlled  Chronic pain syndrome Continue norco 10-325 mg q4h prn pain with neurontin  Goals of care: short term rehabilitation   Labs/tests ordered: cbc, bmp  Family/ staff Communication: reviewed care plan with patient and nursing supervisor    Blanchie Serve, MD  [place] [phone] (Monday-Friday 8 am - 5 pm) [phone] (afterhours)

## 2015-09-24 ENCOUNTER — Other Ambulatory Visit: Payer: Self-pay | Admitting: *Deleted

## 2015-09-24 ENCOUNTER — Ambulatory Visit: Payer: Medicare Other | Admitting: Physician Assistant

## 2015-09-24 ENCOUNTER — Encounter: Payer: Self-pay | Admitting: *Deleted

## 2015-09-24 MED ORDER — HYDROCODONE-ACETAMINOPHEN 10-325 MG PO TABS: ORAL_TABLET | ORAL | Status: DC

## 2015-09-24 NOTE — Telephone Encounter (Signed)
[name] 

## 2015-09-28 ENCOUNTER — Encounter: Payer: Self-pay | Admitting: Internal Medicine

## 2015-09-29 ENCOUNTER — Non-Acute Institutional Stay (SKILLED_NURSING_FACILITY): Payer: Medicare Other | Admitting: Nurse Practitioner

## 2015-09-29 ENCOUNTER — Encounter: Payer: Self-pay | Admitting: Nurse Practitioner

## 2015-09-29 DIAGNOSIS — M797 Fibromyalgia: Secondary | ICD-10-CM | POA: Diagnosis not present

## 2015-09-29 DIAGNOSIS — I5032 Chronic diastolic (congestive) heart failure: Secondary | ICD-10-CM

## 2015-09-29 DIAGNOSIS — K219 Gastro-esophageal reflux disease without esophagitis: Secondary | ICD-10-CM | POA: Diagnosis not present

## 2015-09-29 DIAGNOSIS — R531 Weakness: Secondary | ICD-10-CM

## 2015-09-29 DIAGNOSIS — N390 Urinary tract infection, site not specified: Secondary | ICD-10-CM | POA: Diagnosis not present

## 2015-09-29 DIAGNOSIS — B9620 Unspecified Escherichia coli [E. coli] as the cause of diseases classified elsewhere: Secondary | ICD-10-CM

## 2015-09-29 DIAGNOSIS — I10 Essential (primary) hypertension: Secondary | ICD-10-CM | POA: Diagnosis not present

## 2015-09-29 DIAGNOSIS — K5901 Slow transit constipation: Secondary | ICD-10-CM | POA: Diagnosis not present

## 2015-09-29 DIAGNOSIS — I25810 Atherosclerosis of coronary artery bypass graft(s) without angina pectoris: Secondary | ICD-10-CM | POA: Diagnosis not present

## 2015-09-29 NOTE — Progress Notes (Signed)
Nursing Home Location:  [place] of Service: SNF (31)  PCP: [name]  Allergies  Allergen Reactions  . Citalopram Other (See Comments)    Reaction:  Altered mental status   . Cymbalta [Duloxetine Hcl] Other (See Comments)    Reaction:  Sedative for pt   . Imipramine Other (See Comments)    Reaction:  Unknown   . Proton Pump Inhibitors Other (See Comments)    Reaction:  Unknown     Chief Complaint  Patient presents with  . Discharge Note    HPI:  Patient is a 81 y.o. female seen today at [facility] for discharge home. Pt with a pmh of HTN, diastolic CHF, AS, OA, CAD and morbid obesity. Pt at [facility] place for short term rehabilitation post hospital admission from 09/13/15-09/18/15 with generalized weakness and falls. She was diagnosed to have e.coli uti and was started on antibiotics. Her gabapentin dosing was increased for fibromyalgia. Pain is well controlled on current regimen. Constipation in controlled on MOM. Patient currently doing well with therapy, now stable to discharge home with home health.  Review of Systems:  Review of Systems  Constitutional: Negative for activity change, appetite change, fatigue and unexpected weight change.  HENT: Negative for congestion and hearing loss.   Eyes: Negative.   Respiratory: Negative for cough and shortness of breath.   Cardiovascular: Negative for chest pain, palpitations and leg swelling.  Gastrointestinal: Negative for abdominal pain, diarrhea and constipation.  Genitourinary: Negative for dysuria and difficulty urinating.  Musculoskeletal: Positive for myalgias and arthralgias.       Generalized pain, also with OA to multiple joints  Skin: Negative for color change and wound.  Neurological: Positive for weakness (generalized weakness). Negative for dizziness.  Psychiatric/Behavioral: Negative for behavioral problems, confusion and agitation.    Past Medical  History  Diagnosis Date  . Hypertension   . Anemia   . Neuralgia, neuritis, and radiculitis, unspecified   . Coronary artery disease     a. s/p 2v CABG 11/12 (VG-LAD, VG-OM1); b. Lexiscan 08/2015: normal study  . Aortic stenosis, severe     a. s/p Magna Ease pericardial tissue valve size 21 mm replacement in 10/2011 for severe AS 11/12; b. echo 07/2015: EF 55-60% mod concentric LVH, GR1DD, LA mildly dilated, PASP 45 mm Hg  . Onychia and paronychia of toe   . Osteoarthrosis, unspecified whether generalized or localized, pelvic region and thigh     mainly in her back and knees  . Enthesopathy of hip region   . Esophageal reflux     followed by Dr.Seigal. stabilized with a combination of Nexium and Zantac  . Knee joint replacement by other means   . Stress incontinence, female     followed by Dr.Cope  . Complete heart block [facility])     a. s/p St Jude PPM 10/2011; b. SN # [id number]  . Cardiac pacemaker -st Judes     11/12  . Chronic diastolic heart failure ([place])     a. echo 2014: EF 55-60%, no RWMA, GR1DD, PASP 47 mm Hg; b. echo 2012L EF 55-60%, GR1DD, mild MR  . Type II or unspecified type diabetes mellitus without mention of complication, not stated as uncontrolled     pt. reports that she is borderline   . Spinal stenosis   . Neuropathy ([place])   . Morbid obesity ([place])   . RAD (reactive airway disease)  a. chronic SOB  . Fibromyalgia   . HLD (hyperlipidemia)    Past Surgical History  Procedure Laterality Date  . Replacement total knee  11/2006    right knee  . Nasal sinus surgery  2009  . Total hip arthroplasty  09/2010  . Cardiac catheterization  08/2009    50% stenosis distal left main, 50% stenosis ostial left circumflex.   . Cardiac catheterization      at [facility]  . Eye surgery      IOL/ after cataracts removed- bilateral   . Aortic valve replacement  10/17/2011    Procedure: AORTIC VALVE REPLACEMENT (AVR);  Surgeon: Melrose Nakayama, MD;  Location: [place];  Service: Open  Heart Surgery;  Laterality: N/A;  . Coronary artery bypass graft  10/17/2011    Procedure: CORONARY ARTERY BYPASS GRAFTING (CABG);  Surgeon: Melrose Nakayama, MD;  Location: [place];  Service: Open Heart Surgery;  Laterality: N/A;  Times Two, using right leg greater saphenous vein harvested endoscopically  . Permanent pacemaker insertion N/A 10/20/2011    Procedure: PERMANENT PACEMAKER INSERTION;  Surgeon: Thompson Grayer, MD;  Location: [facility] CATH LAB;  Service: Cardiovascular;  Laterality: N/A;   Social History:   reports that she has never smoked. She has never used smokeless tobacco. She reports that she does not drink alcohol or use illicit drugs.  Family History  Problem Relation Age of Onset  . Diabetes Other   . Heart disease Sister   . Heart disease Brother   . Heart disease Brother   . Heart disease Brother   . Anesthesia problems Neg Hx   . Hypotension Neg Hx   . Malignant hyperthermia Neg Hx   . Pseudochol deficiency Neg Hx     Medications: Patient's Medications  New Prescriptions   No medications on file  Previous Medications   ALBUTEROL-IPRATROPIUM (COMBIVENT) 18-103 MCG/ACT INHALER    Inhale 2 puffs into the lungs every 6 (six) hours as needed for wheezing.   AMLODIPINE (NORVASC) 5 MG TABLET    Take 1 tablet (5 mg total) by mouth daily.   ASPIRIN EC 81 MG TABLET    Take 81 mg by mouth daily.   AZELASTINE (ASTELIN) 0.1 % NASAL SPRAY    Place 1 spray into both nostrils daily as needed.    BENZONATATE (TESSALON) 100 MG CAPSULE    Take 200 mg by mouth 3 (three) times daily as needed for cough.    CYANOCOBALAMIN (,VITAMIN B-12,) 1000 MCG/ML INJECTION    Inject 1,000 mcg into the muscle every 30 (thirty) days.   CYCLOSPORINE (RESTASIS) 0.05 % OPHTHALMIC EMULSION    Place 1 drop into both eyes 2 (two) times daily.    FUROSEMIDE (LASIX) 20 MG TABLET    Take 1 tablet (20 mg total) by mouth every other day.   GABAPENTIN (NEURONTIN) 300 MG CAPSULE    Take 300 mg by mouth 2 (two)  times daily.   HYDROCODONE-ACETAMINOPHEN (NORCO) 10-325 MG TABLET    Take one tablet by mouth every 4 hours as needed for moderate pain. Do not exceed 4gm of Tylenol in 24hrs   LOSARTAN (COZAAR) 25 MG TABLET    Take 25 mg by mouth daily.   MAGNESIUM HYDROXIDE (MILK OF MAGNESIA) 400 MG/5ML SUSPENSION    Take 15 mLs by mouth daily as needed for mild constipation.    METOPROLOL SUCCINATE (TOPROL-XL) 50 MG 24 HR TABLET    Take 50 mg by mouth daily.   NITROGLYCERIN (NITROSTAT) 0.4  MG SL TABLET    Place 1 tablet (0.4 mg total) under the tongue every 5 (five) minutes as needed for chest pain.   PANTOPRAZOLE (PROTONIX) 40 MG TABLET    Take 40 mg by mouth 2 (two) times daily.     PHENAZOPYRIDINE (PYRIDIUM) 200 MG TABLET    Take 1 tablet (200 mg total) by mouth 3 (three) times daily as needed for pain.   POTASSIUM CHLORIDE (K-DUR) 10 MEQ TABLET    Take 10 mEq by mouth daily.   PROMETHAZINE (PHENERGAN) 25 MG TABLET    Take 1 tablet (25 mg total) by mouth every 6 (six) hours as needed for nausea or vomiting.  Modified Medications   No medications on file  Discontinued Medications   No medications on file     Physical Exam: Filed Vitals:   09/29/15 1221  BP: 132/67  Pulse: 78  Temp: 98.1 F (36.7 C)  Resp: 20  Weight: 245 lb (111.131 kg)    Physical Exam  Constitutional: She is oriented to person, place, and time. She appears well-developed and well-nourished. No distress.  HENT:  Head: Normocephalic and atraumatic.  Mouth/Throat: Oropharynx is clear and moist. No oropharyngeal exudate.  Eyes: Conjunctivae are normal. Pupils are equal, round, and reactive to light.  Neck: Normal range of motion. Neck supple.  Cardiovascular: Normal rate, regular rhythm and normal heart sounds.   Pulmonary/Chest: Effort normal and breath sounds normal.  Abdominal: Soft. Bowel sounds are normal.  Musculoskeletal: She exhibits no edema or tenderness.  Neurological: She is alert and oriented to person, place,  and time.  Skin: Skin is warm and dry. She is not diaphoretic.  Psychiatric: She has a normal mood and affect.    Labs reviewed: Basic Metabolic Panel:  Recent Labs  08/25/15 1552 09/13/15 1319 09/14/15 0656 09/17/15 0557  NA 138 138 136  --   K 4.5 3.9 3.9  --   CL 99 102 104  --   CO2 24 31 25   --   GLUCOSE 125* 105* 132*  --   BUN 12 16 12   --   CREATININE 0.68 0.67 0.64 0.67  CALCIUM 9.2 8.9 8.7*  --    Liver Function Tests:  Recent Labs  10/16/14 2102 10/20/14 0509 12/04/14 1413  AST 39* 23 26  ALT 43 29 18  ALKPHOS 113 83 92  BILITOT 0.3 0.3 0.8  PROT 7.2 6.4 7.9  ALBUMIN 2.5* 2.2* 3.5   No results for input(s): LIPASE, AMYLASE in the last 8760 hours. No results for input(s): AMMONIA in the last 8760 hours. CBC:  Recent Labs  10/16/14 2102  12/04/14 1413 08/25/15 1552 09/13/15 1319 09/14/15 0656  WBC 6.8  < > 9.8 6.6 6.7 6.6  NEUTROABS 4.9  --   --   --   --   --   HGB 11.4*  < > 11.9*  --  11.4* 11.2*  HCT 35.5  < > 38.0 36.4 35.9 34.3*  MCV 88  < > 88  --  81.2 83.2  PLT 271  < > 278  --  241 240  < > = values in this interval not displayed. TSH:  Recent Labs  08/25/15 1552  TSH 1.050   A1C: Lab Results  Component Value Date   HGBA1C 6.6* 10/16/2011   Lipid Panel: No results for input(s): CHOL, HDL, LDLCALC, TRIG, CHOLHDL, LDLDIRECT in the last 8760 hours.  Radiological Exams: Dg Chest 1 View  09/13/2015  CLINICAL DATA:  Weakness. Four falls in the last 2 weeks. Low back pain. EXAM: CHEST 1 VIEW COMPARISON:  06/16/2013 FINDINGS: Dual lead pacer noted, lead positioning unchanged. Prior CABG. Accounting for the AP projection, there is mild enlargement of the cardiopericardial silhouette. Tortuous thoracic aorta, stable.  No edema.  The lungs appear clear. IMPRESSION: 1. Mild enlargement of the cardiopericardial silhouette, without edema. 2. Prior CABG.  Dual lead pacer remains in place. 3. Stable tortuosity of the thoracic aorta.  Electronically Signed   By: Van Clines M.D.   On: 09/13/2015 14:02   Dg Lumbar Spine Complete  09/13/2015  CLINICAL DATA:  Generalized weakness. For falls in the last 2 weeks. Low back pain. EXAM: LUMBAR SPINE - COMPLETE 4+ VIEW COMPARISON:  CT scan from 10/16/2014 FINDINGS: Bony demineralization. Mild sclerosis along the iliac side of both sacroiliac joints. Right hip implant. Degenerative spurring of the left femoral head. Right facet arthropathy at L4-5 and L5-S1. Aortoiliac atherosclerotic vascular disease. 3-4 mm of grade 1 degenerative anterolisthesis at L3-4 and L4-5, without pars defects observed. This is similar to the prior CT scan. No fracture or new subluxation is identified. IMPRESSION: 1. Reduced sensitivity due to bony demineralization. 2. No fracture or acute subluxation is identified. There is 3-4 mm of grade 1 degenerative anterolisthesis at L3-4 and L4-5. 3. Lower lumbar facet arthropathy. 4. Mild sclerosis along the iliac side of both sacroiliac joints. 5. Mild degenerative spurring of the left femoral head. Electronically Signed   By: Van Clines M.D.   On: 09/13/2015 14:05   Dg Pelvis 1-2 Views  09/13/2015  CLINICAL DATA:  Fall x4 in the last 2 weeks. Bilateral hip pain and low back pain. Generalized weakness. EXAM: PELVIS - 1-2 VIEW COMPARISON:  CT abdomen and pelvis, 10/16/2014 FINDINGS: No acute fracture. Right hip prosthesis appears well seated and aligned. Arthropathic changes of the right hip with mild concentric hip joint space narrowing and marginal osteophytes from the femoral head are stable. Bones are demineralized. SI joints and symphysis pubis are normally aligned. Iliac artery vascular calcifications are noted in the upper pelvis. IMPRESSION: No fracture or acute finding. Electronically Signed   By: Lajean Manes M.D.   On: 09/13/2015 14:07   Ct Head Wo Contrast  09/13/2015  CLINICAL DATA:  Generalized weakness.  Multiple recent falls. EXAM: CT HEAD  WITHOUT CONTRAST TECHNIQUE: Contiguous axial images were obtained from the base of the skull through the vertex without intravenous contrast. COMPARISON:  10/23/2014 head CT. FINDINGS: No evidence of parenchymal hemorrhage or extra-axial fluid collection. No mass lesion, mass effect, or midline shift. No CT evidence of acute infarction. There is intracranial atherosclerotic arterial calcification. Nonspecific stable moderate subcortical and periventricular white matter hypodensity, most in keeping with chronic small vessel ischemic change. There is stable diffuse cerebral volume loss. Stable ventricles, the size of which are concordant with the degree of cerebral volume loss. The visualized paranasal sinuses are essentially clear. The mastoid air cells are unopacified. No evidence of calvarial fracture. IMPRESSION: 1. No acute intracranial abnormality.  No calvarial fracture. 2. Intracranial atherosclerosis, diffuse cerebral volume loss and nonspecific chronic small vessel ischemic white matter change, stable. Electronically Signed   By: Ilona Sorrel M.D.   On: 09/13/2015 14:21    Assessment/Plan 1. Chronic diastolic heart failure (HCC) Stable, cont on cozaar 25 mg daily and toprol xl 50 mg daily. Continue lasix 20 mg qod with potassium supplement.   2. E-coli UTI Resolved, non recurrent symptoms, Completed cipro.   3.  Essential hypertension Stable, cont on continue norvasc 5 mg daily, cozaar 25 mg daily and toprol 50 mg daily  4. Coronary artery disease involving coronary bypass graft of native heart without angina pectoris Stable, denies chest pain, Continue baby aspirin, ACEI and beta blocker.  5. Slow transit constipation Well controlled on MOM daily   6. Fibromyalgia Pain remains stable. Pt with chronic pain syndrome from OA and fibromyalgia, conts on gabapentin 300 mg twice daily.   7. Gastroesophageal reflux disease, esophagitis presence not specified Symptoms controlled on Protonix 40  mg twice daily   8. Generalized weakness With unsteady gait at home which resulted in hospitalization. Pt working with therapy and for gait training and muscle strengthening.  pt is stable for discharge-will need PT/OT/Nursing- for medication disease state teaching per home health. DME needed includes FWW. Rx written.  will need to follow up with PCP within 2 weeks.      Carlos American. Harle Battiest  [facility] [phone] 8 am - 5 pm) [phone] (after hours)

## 2015-10-05 ENCOUNTER — Encounter: Payer: Medicare Other | Admitting: Internal Medicine

## 2015-10-08 ENCOUNTER — Encounter: Payer: Self-pay | Admitting: Internal Medicine

## 2015-11-01 ENCOUNTER — Other Ambulatory Visit: Payer: Self-pay | Admitting: *Deleted

## 2015-11-01 MED ORDER — AMLODIPINE BESYLATE 5 MG PO TABS: 5.0000 mg | ORAL_TABLET | Freq: Every day | ORAL | Status: DC

## 2015-11-01 MED ORDER — GABAPENTIN 300 MG PO CAPS: 300.0000 mg | ORAL_CAPSULE | Freq: Two times a day (BID) | ORAL | Status: DC

## 2015-11-01 MED ORDER — METOPROLOL SUCCINATE ER 50 MG PO TB24: 50.0000 mg | ORAL_TABLET | Freq: Every day | ORAL | Status: DC

## 2015-12-06 ENCOUNTER — Emergency Department: Payer: Medicare Other

## 2015-12-06 ENCOUNTER — Emergency Department
Admission: EM | Admit: 2015-12-06 | Discharge: 2015-12-07 | Disposition: A | Discharge status: Home / Self Care | Payer: Medicare Other | Attending: Emergency Medicine | Admitting: Emergency Medicine

## 2015-12-06 ENCOUNTER — Encounter: Payer: Self-pay | Admitting: Emergency Medicine

## 2015-12-06 DIAGNOSIS — Z79899 Other long term (current) drug therapy: Secondary | ICD-10-CM | POA: Insufficient documentation

## 2015-12-06 DIAGNOSIS — M797 Fibromyalgia: Secondary | ICD-10-CM

## 2015-12-06 DIAGNOSIS — R112 Nausea with vomiting, unspecified: Secondary | ICD-10-CM | POA: Diagnosis present

## 2015-12-06 DIAGNOSIS — R000 Tachycardia, unspecified: Secondary | ICD-10-CM | POA: Diagnosis not present

## 2015-12-06 DIAGNOSIS — R1084 Generalized abdominal pain: Secondary | ICD-10-CM | POA: Insufficient documentation

## 2015-12-06 DIAGNOSIS — E119 Type 2 diabetes mellitus without complications: Secondary | ICD-10-CM | POA: Insufficient documentation

## 2015-12-06 DIAGNOSIS — I10 Essential (primary) hypertension: Secondary | ICD-10-CM | POA: Diagnosis not present

## 2015-12-06 DIAGNOSIS — G8929 Other chronic pain: Secondary | ICD-10-CM

## 2015-12-06 DIAGNOSIS — Z7982 Long term (current) use of aspirin: Secondary | ICD-10-CM | POA: Diagnosis not present

## 2015-12-06 DIAGNOSIS — R197 Diarrhea, unspecified: Secondary | ICD-10-CM | POA: Diagnosis not present

## 2015-12-06 LAB — COMPREHENSIVE METABOLIC PANEL
ALT: 18 U/L (ref 14–54)
AST: 21 U/L (ref 15–41)
Albumin: 4 g/dL (ref 3.5–5.0)
Alkaline Phosphatase: 95 U/L (ref 38–126)
Anion gap: 10 (ref 5–15)
BUN: 13 mg/dL (ref 6–20)
CO2: 26 mmol/L (ref 22–32)
Calcium: 9.3 mg/dL (ref 8.9–10.3)
Chloride: 104 mmol/L (ref 101–111)
Creatinine, Ser: 0.67 mg/dL (ref 0.44–1.00)
GFR calc Af Amer: >60 mL/min (ref >60)
GFR calc non Af Amer: >60 mL/min (ref >60)
Glucose, Bld: 127 mg/dL — ABNORMAL HIGH (ref 65–99)
Potassium: 3.7 mmol/L (ref 3.5–5.1)
Sodium: 140 mmol/L (ref 135–145)
Total Bilirubin: 0.6 mg/dL (ref 0.3–1.2)
Total Protein: 7.8 g/dL (ref 6.5–8.1)

## 2015-12-06 LAB — CBC WITH DIFFERENTIAL/PLATELET
BASOS ABS: 0 10*3/uL (ref 0–0.1)
Basophils Relative: 0 %
EOS PCT: 1 %
Eosinophils Absolute: 0.1 10*3/uL (ref 0–0.7)
HEMATOCRIT: 38.6 % (ref 35.0–47.0)
Hemoglobin: 12.5 g/dL (ref 12.0–16.0)
LYMPHS ABS: 2.8 10*3/uL (ref 1.0–3.6)
LYMPHS PCT: 27 %
MCH: 27.1 pg (ref 26.0–34.0)
MCHC: 32.5 g/dL (ref 32.0–36.0)
MCV: 83.5 fL (ref 80.0–100.0)
MONO ABS: 0.8 10*3/uL (ref 0.2–0.9)
Monocytes Relative: 8 %
NEUTROS ABS: 6.8 10*3/uL — AB (ref 1.4–6.5)
Neutrophils Relative %: 64 %
PLATELETS: 275 10*3/uL (ref 150–440)
RBC: 4.62 MIL/uL (ref 3.80–5.20)
RDW: 17.1 % — AB (ref 11.5–14.5)
WBC: 10.5 10*3/uL (ref 3.6–11.0)

## 2015-12-06 LAB — TROPONIN I: Troponin I: <0.03 ng/mL (ref <0.031)

## 2015-12-06 LAB — LIPASE, BLOOD: Lipase: 30 U/L (ref 11–51)

## 2015-12-06 MED ORDER — IOHEXOL 240 MG/ML SOLN
25.0000 mL | Freq: Once | INTRAMUSCULAR | Status: AC | PRN
Start: 2015-12-06 — End: 2015-12-06
  Administered 2015-12-06: 25 mL via ORAL

## 2015-12-06 MED ORDER — MORPHINE SULFATE (PF) 4 MG/ML IV SOLN
4.0000 mg | Freq: Once | INTRAVENOUS | Status: AC
Start: 2015-12-06 — End: 2015-12-06
  Administered 2015-12-06: 4 mg via INTRAVENOUS
  Filled 2015-12-06: qty 1

## 2015-12-06 MED ORDER — ONDANSETRON HCL 4 MG/2ML IJ SOLN
4.0000 mg | Freq: Once | INTRAMUSCULAR | Status: AC
  Administered 2015-12-06: 4 mg via INTRAVENOUS
  Filled 2015-12-06: qty 2

## 2015-12-06 MED ORDER — IOHEXOL 300 MG/ML  SOLN
100.0000 mL | Freq: Once | INTRAMUSCULAR | Status: AC | PRN
  Administered 2015-12-06: 100 mL via INTRAVENOUS

## 2015-12-06 NOTE — ED Provider Notes (Signed)
[facility] Emergency Department Provider Note  ____________________________________________  Time seen: Approximately 9:37 PM  I have reviewed the triage vital signs and the nursing notes.   HISTORY  Chief Complaint Nausea; Emesis; and Diarrhea    HPI Michele Schultz is a 82 y.o. female the past medical history of extensive chronic pain including fibromyalgia and osteoarthritis who presents with a complaint of intermittent nausea, vomiting, diarrhea 2 weeks as well as "pain everywhere".  She notes the pain has been there for a long time and also states that she stopped taking her hydrocodone about 2 weeks ago, approximately the same time that her other symptoms began.  She states that her doctor took her off of the hydrocodone because they were concerned it was "messing up my stomach".  She states that the vomiting diarrhea as been intermittent.  Nothing makes it better and nothing makes it worse.  She denies any chest pain or shortness of breath.   Past Medical History  Diagnosis Date  . Hypertension   . Anemia   . Neuralgia, neuritis, and radiculitis, unspecified   . Coronary artery disease     a. s/p 2v CABG 11/12 (VG-LAD, VG-OM1); b. Lexiscan 08/2015: normal study  . Aortic stenosis, severe     a. s/p Magna Ease pericardial tissue valve size 21 mm replacement in 10/2011 for severe AS 11/12; b. echo 07/2015: EF 55-60% mod concentric LVH, GR1DD, LA mildly dilated, PASP 45 mm Hg  . Onychia and paronychia of toe   . Osteoarthrosis, unspecified whether generalized or localized, pelvic region and thigh     mainly in her back and knees  . Enthesopathy of hip region   . Esophageal reflux     followed by Dr.Seigal. stabilized with a combination of Nexium and Zantac  . Knee joint replacement by other means   . Stress incontinence, female     followed by Dr.Cope  . Complete heart block [facility])     a. s/p St Jude PPM 10/2011; b. SN # [id number]  . Cardiac pacemaker  -st Judes     11/12  . Chronic diastolic heart failure ([place])     a. echo 2014: EF 55-60%, no RWMA, GR1DD, PASP 47 mm Hg; b. echo 2012L EF 55-60%, GR1DD, mild MR  . Type II or unspecified type diabetes mellitus without mention of complication, not stated as uncontrolled     pt. reports that she is borderline   . Spinal stenosis   . Neuropathy ([place])   . Morbid obesity ([place])   . RAD (reactive airway disease)     a. chronic SOB  . Fibromyalgia   . HLD (hyperlipidemia)   . CHF (congestive heart failure) [facility])     Patient Active Problem List   Diagnosis Date Noted  . HLD (hyperlipidemia)   . Fibromyalgia   . RAD (reactive airway disease)   . Morbid obesity ([place])   . Aortic stenosis, severe   . Chronic pain 06/17/2014  . Pain in the muscles 06/19/2013  . Chest pain 06/19/2013  . Wheezing 06/19/2013  . Generalized weakness 06/03/2013  . Dyspnea 01/27/2013  . Nausea 03/05/2012  . Constipation 03/05/2012  . Atrial fibrillation-non sustained 02/09/2012  . Cardiac pacemaker -st Judes   . Chronic diastolic heart failure ([place])   . Coronary artery disease   . Long term (current) use of anticoagulants 01/03/2012  . S/P CABG x 2 11/14/2011  . S/P aortic valve replacement with porcine valve 11/14/2011  . Back  pain 11/14/2011  . Complete heart block ([place]) 10/20/2011  . HTN (hypertension) 09/15/2011  . Spinal stenosis 03/22/2011  . Fatigue 03/22/2011  . Hyperlipidemia 03/22/2011    Past Surgical History  Procedure Laterality Date  . Replacement total knee  11/2006    right knee  . Nasal sinus surgery  2009  . Total hip arthroplasty  09/2010  . Cardiac catheterization  08/2009    50% stenosis distal left main, 50% stenosis ostial left circumflex.   . Cardiac catheterization      at [facility]  . Eye surgery      IOL/ after cataracts removed- bilateral   . Aortic valve replacement  10/17/2011    Procedure: AORTIC VALVE REPLACEMENT (AVR);  Surgeon: Melrose Nakayama, MD;  Location: [place];  Service: Open Heart Surgery;  Laterality: N/A;  . Coronary artery bypass graft  10/17/2011    Procedure: CORONARY ARTERY BYPASS GRAFTING (CABG);  Surgeon: Melrose Nakayama, MD;  Location: [place];  Service: Open Heart Surgery;  Laterality: N/A;  Times Two, using right leg greater saphenous vein harvested endoscopically  . Permanent pacemaker insertion N/A 10/20/2011    Procedure: PERMANENT PACEMAKER INSERTION;  Surgeon: Thompson Grayer, MD;  Location: [facility] CATH LAB;  Service: Cardiovascular;  Laterality: N/A;    Current Outpatient Rx  Name  Route  Sig  Dispense  Refill  . albuterol-ipratropium (COMBIVENT) 18-103 MCG/ACT inhaler   Inhalation   Inhale 2 puffs into the lungs every 6 (six) hours as needed for wheezing.   1 Inhaler   4   . amLODipine (NORVASC) 5 MG tablet   Oral   Take 1 tablet (5 mg total) by mouth daily.   30 tablet   6   . aspirin EC 81 MG tablet   Oral   Take 81 mg by mouth daily.         Marland Kitchen azelastine (ASTELIN) 0.1 % nasal spray   Each Nare   Place 1 spray into both nostrils daily as needed.          . benzonatate (TESSALON) 100 MG capsule   Oral   Take 200 mg by mouth 3 (three) times daily as needed for cough.          . cyanocobalamin (,VITAMIN B-12,) 1000 MCG/ML injection   Intramuscular   Inject 1,000 mcg into the muscle every 30 (thirty) days.         . cycloSPORINE (RESTASIS) 0.05 % ophthalmic emulsion   Both Eyes   Place 1 drop into both eyes 2 (two) times daily.          . furosemide (LASIX) 20 MG tablet   Oral   Take 1 tablet (20 mg total) by mouth every other day.   30 tablet   6   . gabapentin (NEURONTIN) 300 MG capsule   Oral   Take 1 capsule (300 mg total) by mouth 2 (two) times daily.   60 capsule   2   . HYDROcodone-acetaminophen (NORCO) 10-325 MG tablet      Take one tablet by mouth every 4 hours as needed for moderate pain. Do not exceed 4gm of Tylenol in 24hrs   180 tablet   0   . losartan (COZAAR) 25 MG tablet    Oral   Take 25 mg by mouth daily.         . magnesium hydroxide (MILK OF MAGNESIA) 400 MG/5ML suspension   Oral   Take 15 mLs by mouth daily  as needed for mild constipation.          . metoprolol succinate (TOPROL-XL) 50 MG 24 hr tablet   Oral   Take 1 tablet (50 mg total) by mouth daily.   30 tablet   2   . nitroGLYCERIN (NITROSTAT) 0.4 MG SL tablet   Sublingual   Place 1 tablet (0.4 mg total) under the tongue every 5 (five) minutes as needed for chest pain.   90 tablet   3   . pantoprazole (PROTONIX) 40 MG tablet   Oral   Take 40 mg by mouth 2 (two) times daily.           . phenazopyridine (PYRIDIUM) 200 MG tablet   Oral   Take 1 tablet (200 mg total) by mouth 3 (three) times daily as needed for pain.   20 tablet   0   . potassium chloride (K-DUR) 10 MEQ tablet   Oral   Take 10 mEq by mouth daily.         . promethazine (PHENERGAN) 25 MG tablet   Oral   Take 1 tablet (25 mg total) by mouth every 6 (six) hours as needed for nausea or vomiting.   30 tablet   0     Allergies Citalopram; Cymbalta; Imipramine; and Proton pump inhibitors  Family History  Problem Relation Age of Onset  . Diabetes Other   . Heart disease Sister   . Heart disease Brother   . Heart disease Brother   . Heart disease Brother   . Anesthesia problems Neg Hx   . Hypotension Neg Hx   . Malignant hyperthermia Neg Hx   . Pseudochol deficiency Neg Hx     Social History Social History  Substance Use Topics  . Smoking status: Never Smoker   . Smokeless tobacco: Never Used  . Alcohol Use: No    Review of Systems Constitutional: No fever/chills.  Generalized body pain "everywhere" Eyes: No visual changes. ENT: No sore throat. Cardiovascular: Denies chest pain. Respiratory: Denies shortness of breath. Gastrointestinal: Generalized abdominal pain.  Intermittent nausea, vomiting, diarrhea 2 weeks Genitourinary: Negative for dysuria. Musculoskeletal: Negative for back  pain. Skin: Negative for rash. Neurological: Negative for headaches, focal weakness or numbness.  10-point ROS otherwise negative.  ____________________________________________   PHYSICAL EXAM:  VITAL SIGNS: ED Triage Vitals  Enc Vitals Group     BP 12/06/15 1915 130/92 mmHg     Pulse Rate 12/06/15 1915 68     Resp 12/06/15 1915 18     Temp 12/06/15 1915 98.3 F (36.8 C)     Temp Source 12/06/15 1915 Oral     SpO2 12/06/15 1913 98 %     Weight 12/06/15 1915 230 lb (104.327 kg)     Height 12/06/15 1915 5\' 4"  (1.626 m)     Head Cir --      Peak Flow --      Pain Score 12/06/15 1917 6     Pain Loc --      Pain Edu? --      Excl. in [place]? --     Constitutional: Alert and oriented. Well appearing and in no acute distress. Eyes: Conjunctivae are normal. PERRL. EOMI. Head: Atraumatic. Nose: No congestion/rhinnorhea. Mouth/Throat: Mucous membranes are moist.  Oropharynx non-erythematous. Neck: No stridor.   Cardiovascular: Tachycardia, regular rhythm. Grossly normal heart sounds.  Good peripheral circulation. Respiratory: Normal respiratory effort.  No retractions. Lungs CTAB. Gastrointestinal: Soft with mild generalized tenderness to palpation throughout. No  distention. No abdominal bruits. No CVA tenderness. Musculoskeletal: No lower extremity tenderness nor edema.  No joint effusions. Neurologic:  Normal speech and language. No gross focal neurologic deficits are appreciated.  Skin:  Skin is warm, dry and intact. No rash noted. Psychiatric: Mood and affect are normal. Speech and behavior are normal.  ____________________________________________   LABS (all labs ordered are listed, but only abnormal results are displayed)  Labs Reviewed  COMPREHENSIVE METABOLIC PANEL - Abnormal; Notable for the following:    Glucose, Bld 127 (*)    All other components within normal limits  CBC WITH DIFFERENTIAL/PLATELET - Abnormal; Notable for the following:    RDW 17.1 (*)    Neutro  Abs 6.8 (*)    All other components within normal limits  LIPASE, BLOOD  TROPONIN I  URINALYSIS COMPLETEWITH MICROSCOPIC ([facility] ONLY)   ____________________________________________  EKG  ED ECG REPORT I, Kolette Vey, the attending physician, personally viewed and interpreted this ECG.   Date: 12/06/2015  EKG Time: 21:32  Rate: 128  Rhythm: sinus tachycardia with first-degree AV block  Axis: Normal  Intervals:first-degree A-V block   ST&T Change: Non-specific ST segment / T-wave changes, but no evidence of acute ischemia.   ____________________________________________  RADIOLOGY   Ct Abdomen Pelvis W Contrast  12/06/2015  CLINICAL DATA:  Subacute onset of intermittent nausea, vomiting and diarrhea. Initial encounter. EXAM: CT ABDOMEN AND PELVIS WITH CONTRAST TECHNIQUE: Multidetector CT imaging of the abdomen and pelvis was performed using the standard protocol following bolus administration of intravenous contrast. CONTRAST:  177mL OMNIPAQUE IOHEXOL 300 MG/ML  SOLN COMPARISON:  Pelvic ultrasound performed 07/10/2015, and CT of the abdomen and pelvis from 10/16/2014 FINDINGS: Mild scarring is noted at the right lung base. Calcification is noted at the aortic valve. Scattered coronary artery calcifications are seen. Pacemaker leads are partially imaged. The liver and spleen are unremarkable in appearance. Tiny stones are suggested dependently within the gallbladder. The gallbladder is otherwise unremarkable. The pancreas and adrenal glands are unremarkable. There is a 5 mm stone at the interpole region of the left kidney. Scattered small bilateral renal cysts measure up to 1.3 cm in size. No obstructing ureteral stones are seen. There is no evidence of hydronephrosis. No perinephric stranding is appreciated. No free fluid is identified. The small bowel is unremarkable in appearance. The stomach is within normal limits. No acute vascular abnormalities are seen. Scattered calcification is  noted along the abdominal aorta and its branches. The appendix is noted at the left mid abdomen and is unremarkable in appearance. The cecum is flipped into the left upper quadrant. The colon is largely decompressed and is unremarkable in appearance. The bladder is mildly distended and grossly remarkable. The uterus is unremarkable in appearance. The ovaries are relatively symmetric. No suspicious adnexal masses are seen. No inguinal lymphadenopathy is seen. No acute osseous abnormalities are identified. The patient's right hip hemiarthroplasty is grossly unremarkable in appearance. Vacuum phenomenon is noted at L5-S1. IMPRESSION: 1. No acute abnormality seen in the abdomen or pelvis. 2. 5 mm stone at the interpole region of the left kidney. Scattered small bilateral renal cysts noted. 3. Suggestion of minimal cholelithiasis. Gallbladder otherwise unremarkable. 4. Scattered coronary artery calcifications seen. Calcification noted at the aortic valve. 5. Mild scarring at the right lung base. 6. Cecum flipped into the left upper quadrant. Colon unremarkable in appearance. 7. Scattered calcification along the abdominal aorta and its branches. Electronically Signed   By: Garald Balding M.D.   On: 12/06/2015 23:52  Dg Abd Acute W/chest  12/06/2015  CLINICAL DATA:  Persistent cough. EXAM: DG ABDOMEN ACUTE W/ 1V CHEST COMPARISON:  September 13, 2015. FINDINGS: There is no evidence of dilated bowel loops or free intraperitoneal air. No radiopaque calculi or other significant radiographic abnormality is seen. Heart size and mediastinal contours are within normal limits. Both lungs are clear. IMPRESSION: There is no evidence of bowel obstruction or ileus. No acute cardiopulmonary disease. Electronically Signed   By: Marijo Conception, M.D.   On: 12/06/2015 20:53    ____________________________________________   PROCEDURES  Procedure(s) performed: None  Critical Care performed:  No ____________________________________________   INITIAL IMPRESSION / ASSESSMENT AND PLAN / ED COURSE  Pertinent labs & imaging results that were available during my care of the patient were reviewed by me and considered in my medical decision making (see chart for details).  I suspect that the patient's symptoms are related to chronic opioid dependence which stopped 2 weeks ago and now she is having some narcotic withdrawal symptoms (even though 2 weeks is a long time to have the symptoms) and chronic pain.  Given her age I will evaluate her broadly including a CT scan of her abdomen and pelvis.  ----------------------------------------- 12:28 AM on 12/07/2015 -----------------------------------------  The patient's workup has been unremarkable.  Resting comfortably with a heart rate in the 80s and her labs are all reassuring.  Her CT scan shows no acute abnormalities.  I am waiting for her to provide a urine sample and that she may be discharged for outpatient follow-up.  I do not believe that it is appropriate to prescribe narcotic pain medicine to an 82 year old with a history of chronic pain and no acute abnormalities.  Dr. Owens Shark will follow up her urinalysis and at any antibiotics at the patient needs, otherwise she should follow-up with her outpatient doctor.  Of note, during the hours she has been emergency Department she has had no episodes of vomiting or diarrhea. ____________________________________________  FINAL CLINICAL IMPRESSION(S) / ED DIAGNOSES  Final diagnoses:  Chronic pain  Fibromyalgia  Non-intractable vomiting with nausea, vomiting of unspecified type      NEW MEDICATIONS STARTED DURING THIS VISIT:  New Prescriptions   No medications on file     Hinda Kehr, MD 12/07/15 [phone]

## 2015-12-06 NOTE — ED Notes (Signed)
Intermittent nausea, vomiting, and diarrhea x 2 weeks.

## 2015-12-07 DIAGNOSIS — R112 Nausea with vomiting, unspecified: Secondary | ICD-10-CM | POA: Diagnosis not present

## 2015-12-07 LAB — URINALYSIS COMPLETE WITH MICROSCOPIC (ARMC ONLY)
BACTERIA UA: NONE SEEN
BILIRUBIN URINE: NEGATIVE
GLUCOSE, UA: NEGATIVE mg/dL
Ketones, ur: NEGATIVE mg/dL
LEUKOCYTES UA: NEGATIVE
Nitrite: NEGATIVE
Protein, ur: NEGATIVE mg/dL
Specific Gravity, Urine: >1.06 — ABNORMAL HIGH (ref 1.005–1.030)
pH: 6 (ref 5.0–8.0)

## 2015-12-07 MED ORDER — KETOROLAC TROMETHAMINE 30 MG/ML IJ SOLN
INTRAMUSCULAR | Status: AC
  Administered 2015-12-07: 30 mg via INTRAVENOUS
  Filled 2015-12-07: qty 1

## 2015-12-07 MED ORDER — KETOROLAC TROMETHAMINE 30 MG/ML IJ SOLN
30.0000 mg | Freq: Once | INTRAMUSCULAR | Status: AC
  Administered 2015-12-07: 30 mg via INTRAVENOUS

## 2015-12-07 NOTE — Discharge Instructions (Signed)

## 2015-12-07 NOTE — ED Notes (Signed)
Attempted to call family, Gloris Ham [phone] and Darnelle Bos [phone] left messages at both numbers.  Also called Nephew Eugenio Hoes and spoke with him but he did not know name of home health nurse.

## 2015-12-07 NOTE — ED Notes (Signed)
Attepted to call pt niece, Gloris Ham at [phone], left message for her to call back.

## 2015-12-07 NOTE — ED Notes (Signed)
Spoke with pt family who reports they are on their way to pick her up.

## 2016-01-11 ENCOUNTER — Encounter: Payer: Self-pay | Admitting: *Deleted

## 2016-03-13 ENCOUNTER — Emergency Department: Payer: Medicare Other

## 2016-03-13 ENCOUNTER — Observation Stay
Admission: EM | Admit: 2016-03-13 | Discharge: 2016-03-14 | Disposition: A | Discharge status: Home / Self Care | Payer: Medicare Other | Attending: Internal Medicine | Admitting: Internal Medicine

## 2016-03-13 DIAGNOSIS — Z961 Presence of intraocular lens: Secondary | ICD-10-CM | POA: Insufficient documentation

## 2016-03-13 DIAGNOSIS — I10 Essential (primary) hypertension: Secondary | ICD-10-CM | POA: Insufficient documentation

## 2016-03-13 DIAGNOSIS — M479 Spondylosis, unspecified: Secondary | ICD-10-CM | POA: Diagnosis not present

## 2016-03-13 DIAGNOSIS — I5032 Chronic diastolic (congestive) heart failure: Secondary | ICD-10-CM | POA: Insufficient documentation

## 2016-03-13 DIAGNOSIS — Z833 Family history of diabetes mellitus: Secondary | ICD-10-CM | POA: Insufficient documentation

## 2016-03-13 DIAGNOSIS — Z8249 Family history of ischemic heart disease and other diseases of the circulatory system: Secondary | ICD-10-CM | POA: Insufficient documentation

## 2016-03-13 DIAGNOSIS — Z9842 Cataract extraction status, left eye: Secondary | ICD-10-CM | POA: Diagnosis not present

## 2016-03-13 DIAGNOSIS — R1110 Vomiting, unspecified: Secondary | ICD-10-CM | POA: Insufficient documentation

## 2016-03-13 DIAGNOSIS — Z888 Allergy status to other drugs, medicaments and biological substances status: Secondary | ICD-10-CM | POA: Diagnosis not present

## 2016-03-13 DIAGNOSIS — Z952 Presence of prosthetic heart valve: Secondary | ICD-10-CM | POA: Diagnosis not present

## 2016-03-13 DIAGNOSIS — R079 Chest pain, unspecified: Principal | ICD-10-CM | POA: Insufficient documentation

## 2016-03-13 DIAGNOSIS — Z951 Presence of aortocoronary bypass graft: Secondary | ICD-10-CM | POA: Insufficient documentation

## 2016-03-13 DIAGNOSIS — R531 Weakness: Secondary | ICD-10-CM | POA: Insufficient documentation

## 2016-03-13 DIAGNOSIS — Z79899 Other long term (current) drug therapy: Secondary | ICD-10-CM | POA: Diagnosis not present

## 2016-03-13 DIAGNOSIS — I350 Nonrheumatic aortic (valve) stenosis: Secondary | ICD-10-CM | POA: Insufficient documentation

## 2016-03-13 DIAGNOSIS — Z9841 Cataract extraction status, right eye: Secondary | ICD-10-CM | POA: Insufficient documentation

## 2016-03-13 DIAGNOSIS — Z953 Presence of xenogenic heart valve: Secondary | ICD-10-CM | POA: Insufficient documentation

## 2016-03-13 DIAGNOSIS — M797 Fibromyalgia: Secondary | ICD-10-CM | POA: Insufficient documentation

## 2016-03-13 DIAGNOSIS — M170 Bilateral primary osteoarthritis of knee: Secondary | ICD-10-CM | POA: Diagnosis not present

## 2016-03-13 DIAGNOSIS — Z9889 Other specified postprocedural states: Secondary | ICD-10-CM | POA: Insufficient documentation

## 2016-03-13 DIAGNOSIS — G8929 Other chronic pain: Secondary | ICD-10-CM | POA: Diagnosis not present

## 2016-03-13 DIAGNOSIS — Z96651 Presence of right artificial knee joint: Secondary | ICD-10-CM | POA: Insufficient documentation

## 2016-03-13 DIAGNOSIS — E1140 Type 2 diabetes mellitus with diabetic neuropathy, unspecified: Secondary | ICD-10-CM | POA: Diagnosis not present

## 2016-03-13 DIAGNOSIS — K219 Gastro-esophageal reflux disease without esophagitis: Secondary | ICD-10-CM | POA: Insufficient documentation

## 2016-03-13 DIAGNOSIS — Z950 Presence of cardiac pacemaker: Secondary | ICD-10-CM | POA: Insufficient documentation

## 2016-03-13 DIAGNOSIS — M4800 Spinal stenosis, site unspecified: Secondary | ICD-10-CM | POA: Diagnosis not present

## 2016-03-13 DIAGNOSIS — R112 Nausea with vomiting, unspecified: Secondary | ICD-10-CM | POA: Diagnosis present

## 2016-03-13 DIAGNOSIS — Z7982 Long term (current) use of aspirin: Secondary | ICD-10-CM | POA: Diagnosis not present

## 2016-03-13 DIAGNOSIS — I472 Ventricular tachycardia: Secondary | ICD-10-CM | POA: Diagnosis not present

## 2016-03-13 DIAGNOSIS — I442 Atrioventricular block, complete: Secondary | ICD-10-CM | POA: Insufficient documentation

## 2016-03-13 DIAGNOSIS — R29898 Other symptoms and signs involving the musculoskeletal system: Secondary | ICD-10-CM | POA: Insufficient documentation

## 2016-03-13 DIAGNOSIS — E785 Hyperlipidemia, unspecified: Secondary | ICD-10-CM | POA: Diagnosis not present

## 2016-03-13 DIAGNOSIS — R42 Dizziness and giddiness: Secondary | ICD-10-CM | POA: Insufficient documentation

## 2016-03-13 DIAGNOSIS — J45909 Unspecified asthma, uncomplicated: Secondary | ICD-10-CM | POA: Insufficient documentation

## 2016-03-13 DIAGNOSIS — I25709 Atherosclerosis of coronary artery bypass graft(s), unspecified, with unspecified angina pectoris: Secondary | ICD-10-CM | POA: Insufficient documentation

## 2016-03-13 DIAGNOSIS — Z96649 Presence of unspecified artificial hip joint: Secondary | ICD-10-CM | POA: Diagnosis not present

## 2016-03-13 DIAGNOSIS — I25119 Atherosclerotic heart disease of native coronary artery with unspecified angina pectoris: Secondary | ICD-10-CM | POA: Diagnosis not present

## 2016-03-13 DIAGNOSIS — R296 Repeated falls: Secondary | ICD-10-CM | POA: Diagnosis not present

## 2016-03-13 LAB — MAGNESIUM: MAGNESIUM: 2 mg/dL (ref 1.7–2.4)

## 2016-03-13 LAB — BASIC METABOLIC PANEL
Anion gap: 8 (ref 5–15)
BUN: 18 mg/dL (ref 6–20)
CHLORIDE: 105 mmol/L (ref 101–111)
CO2: 24 mmol/L (ref 22–32)
CREATININE: 0.66 mg/dL (ref 0.44–1.00)
Calcium: 8.9 mg/dL (ref 8.9–10.3)
GFR calc Af Amer: >60 mL/min (ref >60)
GFR calc non Af Amer: >60 mL/min (ref >60)
Glucose, Bld: 204 mg/dL — ABNORMAL HIGH (ref 65–99)
Potassium: 3.9 mmol/L (ref 3.5–5.1)
SODIUM: 137 mmol/L (ref 135–145)

## 2016-03-13 LAB — HEPATIC FUNCTION PANEL
ALBUMIN: 4 g/dL (ref 3.5–5.0)
ALT: 25 U/L (ref 14–54)
AST: 31 U/L (ref 15–41)
Alkaline Phosphatase: 111 U/L (ref 38–126)
Total Bilirubin: 0.6 mg/dL (ref 0.3–1.2)
Total Protein: 7.4 g/dL (ref 6.5–8.1)

## 2016-03-13 LAB — TROPONIN I
Troponin I: <0.03 ng/mL (ref <0.031)
Troponin I: <0.03 ng/mL (ref <0.031)

## 2016-03-13 LAB — CBC
HCT: 36.2 % (ref 35.0–47.0)
Hemoglobin: 12 g/dL (ref 12.0–16.0)
MCH: 27.6 pg (ref 26.0–34.0)
MCHC: 33 g/dL (ref 32.0–36.0)
MCV: 83.5 fL (ref 80.0–100.0)
PLATELETS: 258 10*3/uL (ref 150–440)
RBC: 4.34 MIL/uL (ref 3.80–5.20)
RDW: 16.6 % — AB (ref 11.5–14.5)
WBC: 9.3 10*3/uL (ref 3.6–11.0)

## 2016-03-13 LAB — LIPASE, BLOOD: Lipase: 36 U/L (ref 11–51)

## 2016-03-13 MED ORDER — NITROGLYCERIN 0.4 MG SL SUBL: 0.4000 mg | SUBLINGUAL_TABLET | SUBLINGUAL | Status: DC | PRN

## 2016-03-13 MED ORDER — POTASSIUM CHLORIDE ER 10 MEQ PO TBCR
10.0000 meq | EXTENDED_RELEASE_TABLET | Freq: Every day | ORAL | Status: DC
  Administered 2016-03-14: 10 meq via ORAL
  Filled 2016-03-13 (×4): qty 1

## 2016-03-13 MED ORDER — SODIUM CHLORIDE 0.9% FLUSH
3.0000 mL | Freq: Two times a day (BID) | INTRAVENOUS | Status: DC
  Administered 2016-03-13 – 2016-03-14 (×2): 3 mL via INTRAVENOUS

## 2016-03-13 MED ORDER — IPRATROPIUM-ALBUTEROL 0.5-2.5 (3) MG/3ML IN SOLN: 3.0000 mL | Freq: Four times a day (QID) | RESPIRATORY_TRACT | Status: DC | PRN

## 2016-03-13 MED ORDER — CYANOCOBALAMIN 1000 MCG/ML IJ SOLN
1000.0000 ug | INTRAMUSCULAR | Status: DC
  Administered 2016-03-13: 1000 ug via INTRAMUSCULAR
  Filled 2016-03-13: qty 1

## 2016-03-13 MED ORDER — PANTOPRAZOLE SODIUM 40 MG PO TBEC
40.0000 mg | DELAYED_RELEASE_TABLET | Freq: Two times a day (BID) | ORAL | Status: DC
  Administered 2016-03-13 – 2016-03-14 (×2): 40 mg via ORAL
  Filled 2016-03-13 (×2): qty 1

## 2016-03-13 MED ORDER — HYDROCODONE-ACETAMINOPHEN 10-325 MG PO TABS
1.0000 | ORAL_TABLET | ORAL | Status: DC | PRN
  Administered 2016-03-14: 1 via ORAL
  Filled 2016-03-13: qty 1

## 2016-03-13 MED ORDER — ONDANSETRON HCL 4 MG/2ML IJ SOLN
4.0000 mg | Freq: Four times a day (QID) | INTRAMUSCULAR | Status: DC | PRN
Start: 2016-03-13 — End: 2016-03-14

## 2016-03-13 MED ORDER — FUROSEMIDE 20 MG PO TABS
20.0000 mg | ORAL_TABLET | ORAL | Status: DC
  Filled 2016-03-13: qty 1

## 2016-03-13 MED ORDER — PROMETHAZINE HCL 25 MG PO TABS: 25.0000 mg | ORAL_TABLET | Freq: Four times a day (QID) | ORAL | Status: DC | PRN

## 2016-03-13 MED ORDER — LOSARTAN POTASSIUM 25 MG PO TABS
25.0000 mg | ORAL_TABLET | Freq: Every day | ORAL | Status: DC
  Administered 2016-03-14: 25 mg via ORAL
  Filled 2016-03-13 (×2): qty 1

## 2016-03-13 MED ORDER — HYDROCODONE-ACETAMINOPHEN 5-325 MG PO TABS
1.0000 | ORAL_TABLET | Freq: Once | ORAL | Status: AC
  Administered 2016-03-13: 1 via ORAL

## 2016-03-13 MED ORDER — METOPROLOL SUCCINATE ER 50 MG PO TB24
50.0000 mg | ORAL_TABLET | Freq: Every day | ORAL | Status: DC
  Administered 2016-03-14: 50 mg via ORAL
  Filled 2016-03-13 (×2): qty 1

## 2016-03-13 MED ORDER — AMLODIPINE BESYLATE 5 MG PO TABS
5.0000 mg | ORAL_TABLET | Freq: Every day | ORAL | Status: DC
  Administered 2016-03-14: 5 mg via ORAL
  Filled 2016-03-13 (×2): qty 1

## 2016-03-13 MED ORDER — BENZONATATE 100 MG PO CAPS: 200.0000 mg | ORAL_CAPSULE | Freq: Three times a day (TID) | ORAL | Status: DC | PRN

## 2016-03-13 MED ORDER — HYDROCODONE-ACETAMINOPHEN 5-325 MG PO TABS
ORAL_TABLET | ORAL | Status: AC
  Administered 2016-03-13: 1 via ORAL
  Filled 2016-03-13: qty 1

## 2016-03-13 MED ORDER — CYCLOSPORINE 0.05 % OP EMUL
1.0000 [drp] | Freq: Two times a day (BID) | OPHTHALMIC | Status: DC
  Administered 2016-03-13: 8 [drp] via OPHTHALMIC
  Administered 2016-03-14: 1 [drp] via OPHTHALMIC
  Filled 2016-03-13 (×3): qty 1

## 2016-03-13 MED ORDER — ACETAMINOPHEN 325 MG PO TABS: 650.0000 mg | ORAL_TABLET | Freq: Four times a day (QID) | ORAL | Status: DC | PRN

## 2016-03-13 MED ORDER — ENOXAPARIN SODIUM 40 MG/0.4ML ~~LOC~~ SOLN
40.0000 mg | Freq: Two times a day (BID) | SUBCUTANEOUS | Status: DC
  Administered 2016-03-13 – 2016-03-14 (×2): 40 mg via SUBCUTANEOUS
  Filled 2016-03-13 (×2): qty 0.4

## 2016-03-13 MED ORDER — ONDANSETRON HCL 4 MG/2ML IJ SOLN
4.0000 mg | Freq: Once | INTRAMUSCULAR | Status: AC | PRN
  Administered 2016-03-13: 4 mg via INTRAVENOUS
  Filled 2016-03-13: qty 2

## 2016-03-13 MED ORDER — ACETAMINOPHEN 650 MG RE SUPP: 650.0000 mg | Freq: Four times a day (QID) | RECTAL | Status: DC | PRN

## 2016-03-13 MED ORDER — AZELASTINE HCL 0.1 % NA SOLN
1.0000 | NASAL | Status: DC | PRN
  Filled 2016-03-13: qty 30

## 2016-03-13 MED ORDER — GABAPENTIN 300 MG PO CAPS
300.0000 mg | ORAL_CAPSULE | Freq: Two times a day (BID) | ORAL | Status: DC
  Administered 2016-03-13 – 2016-03-14 (×2): 300 mg via ORAL
  Filled 2016-03-13 (×2): qty 1

## 2016-03-13 MED ORDER — ONDANSETRON HCL 4 MG PO TABS
4.0000 mg | ORAL_TABLET | Freq: Four times a day (QID) | ORAL | Status: DC | PRN
  Administered 2016-03-14: 4 mg via ORAL
  Filled 2016-03-13: qty 1

## 2016-03-13 MED ORDER — MAGNESIUM HYDROXIDE 400 MG/5ML PO SUSP: 15.0000 mL | Freq: Every day | ORAL | Status: DC | PRN

## 2016-03-13 MED ORDER — ASPIRIN EC 81 MG PO TBEC
81.0000 mg | DELAYED_RELEASE_TABLET | Freq: Every day | ORAL | Status: DC
  Administered 2016-03-14: 81 mg via ORAL
  Filled 2016-03-13 (×2): qty 1

## 2016-03-13 NOTE — ED Notes (Signed)
Report from Kim, RN

## 2016-03-13 NOTE — ED Notes (Signed)
Pt assisted up to bathroom with stand by assist.

## 2016-03-13 NOTE — ED Provider Notes (Signed)
[facility] Emergency Department Provider Note   ____________________________________________  Time seen: ~1540  I have reviewed the triage vital signs and the nursing notes.   HISTORY  Chief Complaint Chest Pain and Nausea   History limited by: Not Limited   HPI Michele Schultz is a 82 y.o. female who presents to the emergency department today because of concerns for nausea vomiting and chest pain. She states the symptoms started 2-3 days ago. The chest pain is in the center and right chest. She describes it as being sharp. It is intermittent. She has not noticed any modifying factors. She states that she has also had nausea and vomiting for the past 2-3 days. She denies any blood in her vomit. She has had associated diarrhea. She denies any fevers. Denies any abdominal pain.    Past Medical History  Diagnosis Date  . Hypertension   . Anemia   . Neuralgia, neuritis, and radiculitis, unspecified   . Coronary artery disease     a. s/p 2v CABG 11/12 (VG-LAD, VG-OM1); b. Lexiscan 08/2015: normal study  . Aortic stenosis, severe     a. s/p Magna Ease pericardial tissue valve size 21 mm replacement in 10/2011 for severe AS 11/12; b. echo 07/2015: EF 55-60% mod concentric LVH, GR1DD, LA mildly dilated, PASP 45 mm Hg  . Onychia and paronychia of toe   . Osteoarthrosis, unspecified whether generalized or localized, pelvic region and thigh     mainly in her back and knees  . Enthesopathy of hip region   . Esophageal reflux     followed by Dr.Seigal. stabilized with a combination of Nexium and Zantac  . Knee joint replacement by other means   . Stress incontinence, female     followed by Dr.Cope  . Complete heart block [facility])     a. s/p St Jude PPM 10/2011; b. SN # [id number]  . Cardiac pacemaker -st Judes     11/12  . Chronic diastolic heart failure ([place])     a. echo 2014: EF 55-60%, no RWMA, GR1DD, PASP 47 mm Hg; b. echo 2012L EF 55-60%, GR1DD, mild MR  .  Type II or unspecified type diabetes mellitus without mention of complication, not stated as uncontrolled     pt. reports that she is borderline   . Spinal stenosis   . Neuropathy ([place])   . Morbid obesity ([place])   . RAD (reactive airway disease)     a. chronic SOB  . Fibromyalgia   . HLD (hyperlipidemia)   . CHF (congestive heart failure) [facility])     Patient Active Problem List   Diagnosis Date Noted  . HLD (hyperlipidemia)   . Fibromyalgia   . RAD (reactive airway disease)   . Morbid obesity ([place])   . Aortic stenosis, severe   . Chronic pain 06/17/2014  . Pain in the muscles 06/19/2013  . Chest pain 06/19/2013  . Wheezing 06/19/2013  . Generalized weakness 06/03/2013  . Dyspnea 01/27/2013  . Nausea 03/05/2012  . Constipation 03/05/2012  . Atrial fibrillation-non sustained 02/09/2012  . Cardiac pacemaker -st Judes   . Chronic diastolic heart failure ([place])   . Coronary artery disease   . Long term (current) use of anticoagulants 01/03/2012  . S/P CABG x 2 11/14/2011  . S/P aortic valve replacement with porcine valve 11/14/2011  . Back pain 11/14/2011  . Complete heart block ([place]) 10/20/2011  . HTN (hypertension) 09/15/2011  . Spinal stenosis 03/22/2011  . Fatigue 03/22/2011  .  Hyperlipidemia 03/22/2011    Past Surgical History  Procedure Laterality Date  . Replacement total knee  11/2006    right knee  . Nasal sinus surgery  2009  . Total hip arthroplasty  09/2010  . Cardiac catheterization  08/2009    50% stenosis distal left main, 50% stenosis ostial left circumflex.   . Cardiac catheterization      at [facility]  . Eye surgery      IOL/ after cataracts removed- bilateral   . Aortic valve replacement  10/17/2011    Procedure: AORTIC VALVE REPLACEMENT (AVR);  Surgeon: Melrose Nakayama, MD;  Location: [place];  Service: Open Heart Surgery;  Laterality: N/A;  . Coronary artery bypass graft  10/17/2011    Procedure: CORONARY ARTERY BYPASS GRAFTING (CABG);  Surgeon:  Melrose Nakayama, MD;  Location: [place];  Service: Open Heart Surgery;  Laterality: N/A;  Times Two, using right leg greater saphenous vein harvested endoscopically  . Permanent pacemaker insertion N/A 10/20/2011    Procedure: PERMANENT PACEMAKER INSERTION;  Surgeon: Thompson Grayer, MD;  Location: [facility] CATH LAB;  Service: Cardiovascular;  Laterality: N/A;    Current Outpatient Rx  Name  Route  Sig  Dispense  Refill  . albuterol-ipratropium (COMBIVENT) 18-103 MCG/ACT inhaler   Inhalation   Inhale 2 puffs into the lungs every 6 (six) hours as needed for wheezing.   1 Inhaler   4   . amLODipine (NORVASC) 5 MG tablet   Oral   Take 1 tablet (5 mg total) by mouth daily.   30 tablet   6   . aspirin EC 81 MG tablet   Oral   Take 81 mg by mouth daily.         Marland Kitchen azelastine (ASTELIN) 0.1 % nasal spray   Each Nare   Place 1 spray into both nostrils daily as needed.          . benzonatate (TESSALON) 100 MG capsule   Oral   Take 200 mg by mouth 3 (three) times daily as needed for cough.          . cyanocobalamin (,VITAMIN B-12,) 1000 MCG/ML injection   Intramuscular   Inject 1,000 mcg into the muscle every 30 (thirty) days.         . cycloSPORINE (RESTASIS) 0.05 % ophthalmic emulsion   Both Eyes   Place 1 drop into both eyes 2 (two) times daily.          . furosemide (LASIX) 20 MG tablet   Oral   Take 1 tablet (20 mg total) by mouth every other day.   30 tablet   6   . gabapentin (NEURONTIN) 300 MG capsule   Oral   Take 1 capsule (300 mg total) by mouth 2 (two) times daily.   60 capsule   2   . HYDROcodone-acetaminophen (NORCO) 10-325 MG tablet      Take one tablet by mouth every 4 hours as needed for moderate pain. Do not exceed 4gm of Tylenol in 24hrs   180 tablet   0   . losartan (COZAAR) 25 MG tablet   Oral   Take 25 mg by mouth daily.         . magnesium hydroxide (MILK OF MAGNESIA) 400 MG/5ML suspension   Oral   Take 15 mLs by mouth daily as needed  for mild constipation.          . metoprolol succinate (TOPROL-XL) 50 MG 24 hr tablet  Oral   Take 1 tablet (50 mg total) by mouth daily.   30 tablet   2   . nitroGLYCERIN (NITROSTAT) 0.4 MG SL tablet   Sublingual   Place 1 tablet (0.4 mg total) under the tongue every 5 (five) minutes as needed for chest pain.   90 tablet   3   . pantoprazole (PROTONIX) 40 MG tablet   Oral   Take 40 mg by mouth 2 (two) times daily.           . phenazopyridine (PYRIDIUM) 200 MG tablet   Oral   Take 1 tablet (200 mg total) by mouth 3 (three) times daily as needed for pain.   20 tablet   0   . potassium chloride (K-DUR) 10 MEQ tablet   Oral   Take 10 mEq by mouth daily.         . promethazine (PHENERGAN) 25 MG tablet   Oral   Take 1 tablet (25 mg total) by mouth every 6 (six) hours as needed for nausea or vomiting.   30 tablet   0     Allergies Citalopram; Cymbalta; Imipramine; and Proton pump inhibitors  Family History  Problem Relation Age of Onset  . Diabetes Other   . Heart disease Sister   . Heart disease Brother   . Heart disease Brother   . Heart disease Brother   . Anesthesia problems Neg Hx   . Hypotension Neg Hx   . Malignant hyperthermia Neg Hx   . Pseudochol deficiency Neg Hx     Social History Social History  Substance Use Topics  . Smoking status: Never Smoker   . Smokeless tobacco: Never Used  . Alcohol Use: No    Review of Systems  Constitutional: Negative for fever. Cardiovascular: Positive for sharp chest pain Respiratory: Negative for shortness of breath. Gastrointestinal: Positive for nausea vomiting and diarrhea. Negative for abdominal pain. Neurological: Negative for headaches, focal weakness or numbness.   10-point ROS otherwise negative.  ____________________________________________   PHYSICAL EXAM:  VITAL SIGNS: ED Triage Vitals  Enc Vitals Group     BP --      Pulse --      Resp 03/13/16 1536 24     Temp 03/13/16 1536 97.8  F (36.6 C)     Temp Source 03/13/16 1536 Oral     SpO2 03/13/16 1536 99 %     Weight 03/13/16 1536 265 lb (120.203 kg)     Height --      Head Cir --      Peak Flow --      Pain Score 03/13/16 1537 4     Pain Loc --      Pain Edu? --      Excl. in [place]? --      Constitutional: Alert and oriented. Well appearing and in no distress. Eyes: Conjunctivae are normal. PERRL. Normal extraocular movements. ENT   Head: Normocephalic and atraumatic.   Nose: No congestion/rhinnorhea.   Mouth/Throat: Mucous membranes are moist.   Neck: No stridor. Hematological/Lymphatic/Immunilogical: No cervical lymphadenopathy. Cardiovascular: Tachycardic, regular rhythm.  No murmurs, rubs, or gallops. Respiratory: Normal respiratory effort without tachypnea nor retractions. Breath sounds are clear and equal bilaterally. No wheezes/rales/rhonchi. Gastrointestinal: Soft and nontender. No distention. There is no CVA tenderness. Genitourinary: Deferred Musculoskeletal: Normal range of motion in all extremities. No joint effusions.  No lower extremity tenderness nor edema. Neurologic:  Normal speech and language. No gross focal neurologic deficits are appreciated.  Skin:  Skin is warm, dry and intact. No rash noted. Psychiatric: Mood and affect are normal. Speech and behavior are normal. Patient exhibits appropriate insight and judgment.  ____________________________________________    LABS (pertinent positives/negatives)  Labs Reviewed  BASIC METABOLIC PANEL - Abnormal; Notable for the following:    Glucose, Bld 204 (*)    All other components within normal limits  CBC - Abnormal; Notable for the following:    RDW 16.6 (*)    All other components within normal limits  HEPATIC FUNCTION PANEL - Abnormal; Notable for the following:    Bilirubin, Direct <0.1 (*)    All other components within normal limits  TROPONIN I  LIPASE, BLOOD     ____________________________________________   EKG  I, Nance Pear, attending physician, personally viewed and interpreted this EKG  EKG Time: 1536 Rate: 115 Rhythm: sinus tachycardia Axis: normal Intervals: qtc  QRS: 516, LBBB ST changes: no st elevation equivalent Impression: abnormal ekg  I, Nance Pear, attending physician, personally viewed and interpreted this EKG  EKG Time: 1748 Rate: 90 Rhythm: normal sinus rhythm Axis: left axis deviation Intervals: qtc 476 QRS: narrow ST changes: no st elevation Impression: abnoraml ekg   ____________________________________________    RADIOLOGY  CXR IMPRESSION: No active cardiopulmonary disease.  ____________________________________________   PROCEDURES  Procedure(s) performed: None  Critical Care performed: No  ____________________________________________   INITIAL IMPRESSION / ASSESSMENT AND PLAN / ED COURSE  Pertinent labs & imaging results that were available during my care of the patient were reviewed by me and considered in my medical decision making (see chart for details).  Patient presented to the emergency department today because of concerns for chest pain nausea vomiting and some diarrhea. Initial EKG showed a left bundle branch block which was new. She was also mildly tachycardic. However there was some question whether or not this was a paced rhythm. Patient did later on have a narrow complex rhythm. Given this and given patient's chest pain will plan on admission to the hospitalist service for further workup and evaluation.  ____________________________________________   FINAL CLINICAL IMPRESSION(S) / ED DIAGNOSES  Final diagnoses:  Chest pain, unspecified chest pain type  Nausea and vomiting, vomiting of unspecified type     Nance Pear, MD 03/13/16 1840

## 2016-03-13 NOTE — Progress Notes (Signed)
Anticoagulation monitoring(Lovenox):  82 yo  ordered Lovenox 40 mg Q24h  Filed Weights   03/13/16 1536  Weight: 265 lb (120.203 kg)   BMI 45.5  Lab Results  Component Value Date   CREATININE 0.66 03/13/2016   CREATININE 0.67 12/06/2015   CREATININE 0.67 09/17/2015   Estimated Creatinine Clearance: 69.2 mL/min (by C-G formula based on Cr of 0.66). Hemoglobin & Hematocrit     Component Value Date/Time   HGB 12.0 03/13/2016 1545   HGB 11.9* 12/04/2014 1413   HCT 36.2 03/13/2016 1545   HCT 36.4 08/25/2015 1552   HCT 38.0 12/04/2014 1413     Per Protocol for Patient with estCrcl > 30 ml/min and BMI > 40, will transition to Lovenox 40 mg Q12h.

## 2016-03-13 NOTE — Progress Notes (Signed)
Patient is admitted to room 253 with the diagnosis of chest pain. Patient is alert and oriented x 4.  Patient oriented to her room, staff and ascom/call bell. Tele box verified by the RN and Merry Proud NT. Fall contract sighed, bed alarm activated and bed is in the lowest position.  Skin assessment done with Tiffany RN, no skin issues  of concern noted. Patient denied any acute chest pain. Will continue to monitor.

## 2016-03-13 NOTE — ED Notes (Signed)
Pt arrives via [facility] for chest pain with N/V for past 2-3 days.

## 2016-03-13 NOTE — H&P (Signed)
[facility] [place] NAME: Michele Schultz    MR#:  [id number]  DATE OF BIRTH:  1931/01/09  DATE OF ADMISSION:  03/13/2016  PRIMARY CARE PHYSICIAN: [name]   REQUESTING/REFERRING PHYSICIAN: Dr. Nance Pear  CHIEF COMPLAINT:   Chief Complaint  Patient presents with  . Chest Pain  . Nausea    HISTORY OF PRESENT ILLNESS:  Michele Schultz  is a 82 y.o. female with a known history of Hypertension, coronary artery disease status post bypass, aortic stenosis status post bovine aortic valve, osteoarthritis, GERD, status post pacemaker, chronic diastolic CHF, fibromyalgia, morbid obesity who presents to the hospital due to chest pain, nausea. Patient says she developed some sharp chest pain which has gotten worse over the past 2-3 days. Its intermittent nature and radiates down her right arm associated with some shortness of breath, nausea with no acute vomiting. She also said that she fell a few times a past few days and given ongoing pain she came to the ER for further evaluation. In the emergency room patient had an EKG and telemetry monitoring which showed some evidence of ventricular tachycardia. She is clinically asymptomatic during those episodes though. Hospitalist services were contacted for further treatment and evaluation.  PAST MEDICAL HISTORY:   Past Medical History  Diagnosis Date  . Hypertension   . Anemia   . Neuralgia, neuritis, and radiculitis, unspecified   . Coronary artery disease     a. s/p 2v CABG 11/12 (VG-LAD, VG-OM1); b. Lexiscan 08/2015: normal study  . Aortic stenosis, severe     a. s/p Magna Ease pericardial tissue valve size 21 mm replacement in 10/2011 for severe AS 11/12; b. echo 07/2015: EF 55-60% mod concentric LVH, GR1DD, LA mildly dilated, PASP 45 mm Hg  . Onychia and paronychia of toe   . Osteoarthrosis, unspecified whether generalized or localized, pelvic region and thigh     mainly in  her back and knees  . Enthesopathy of hip region   . Esophageal reflux     followed by Dr.Seigal. stabilized with a combination of Nexium and Zantac  . Knee joint replacement by other means   . Stress incontinence, female     followed by Dr.Cope  . Complete heart block [facility])     a. s/p St Jude PPM 10/2011; b. SN # [id number]  . Cardiac pacemaker -st Judes     11/12  . Chronic diastolic heart failure ([place])     a. echo 2014: EF 55-60%, no RWMA, GR1DD, PASP 47 mm Hg; b. echo 2012L EF 55-60%, GR1DD, mild MR  . Type II or unspecified type diabetes mellitus without mention of complication, not stated as uncontrolled     pt. reports that she is borderline   . Spinal stenosis   . Neuropathy ([place])   . Morbid obesity ([place])   . RAD (reactive airway disease)     a. chronic SOB  . Fibromyalgia   . HLD (hyperlipidemia)   . CHF (congestive heart failure) ([place])     PAST SURGICAL HISTORY:   Past Surgical History  Procedure Laterality Date  . Replacement total knee  11/2006    right knee  . Nasal sinus surgery  2009  . Total hip arthroplasty  09/2010  . Cardiac catheterization  08/2009    50% stenosis distal left main, 50% stenosis ostial left circumflex.   . Cardiac catheterization      at [facility]  . Eye surgery  IOL/ after cataracts removed- bilateral   . Aortic valve replacement  10/17/2011    Procedure: AORTIC VALVE REPLACEMENT (AVR);  Surgeon: Melrose Nakayama, MD;  Location: [place];  Service: Open Heart Surgery;  Laterality: N/A;  . Coronary artery bypass graft  10/17/2011    Procedure: CORONARY ARTERY BYPASS GRAFTING (CABG);  Surgeon: Melrose Nakayama, MD;  Location: [place];  Service: Open Heart Surgery;  Laterality: N/A;  Times Two, using right leg greater saphenous vein harvested endoscopically  . Permanent pacemaker insertion N/A 10/20/2011    Procedure: PERMANENT PACEMAKER INSERTION;  Surgeon: Thompson Grayer, MD;  Location: [facility] CATH LAB;  Service: Cardiovascular;  Laterality:  N/A;    SOCIAL HISTORY:   Social History  Substance Use Topics  . Smoking status: Never Smoker   . Smokeless tobacco: Never Used  . Alcohol Use: No    FAMILY HISTORY:   Family History  Problem Relation Age of Onset  . Diabetes Other   . Heart disease Sister   . Heart disease Brother   . Heart disease Brother   . Heart disease Brother   . Anesthesia problems Neg Hx   . Hypotension Neg Hx   . Malignant hyperthermia Neg Hx   . Pseudochol deficiency Neg Hx     DRUG ALLERGIES:   Allergies  Allergen Reactions  . Citalopram Other (See Comments)    Reaction:  Altered mental status   . Cymbalta [Duloxetine Hcl] Other (See Comments)    Reaction:  Sedative for pt   . Imipramine Other (See Comments)    Reaction:  Unknown   . Proton Pump Inhibitors Other (See Comments)    Reaction:  Unknown   . Venlafaxine Nausea And Vomiting and Other (See Comments)    Reaction:  Dizziness     REVIEW OF SYSTEMS:   Review of Systems  Constitutional: Negative for fever and weight loss.  HENT: Negative for congestion, nosebleeds and tinnitus.   Eyes: Negative for blurred vision, double vision and redness.  Respiratory: Negative for cough, hemoptysis and shortness of breath.   Cardiovascular: Positive for chest pain. Negative for orthopnea, leg swelling and PND.  Gastrointestinal: Positive for nausea and vomiting. Negative for abdominal pain, diarrhea and melena.  Genitourinary: Negative for dysuria, urgency and hematuria.  Musculoskeletal: Positive for falls (frequent falls). Negative for joint pain.  Neurological: Positive for weakness. Negative for dizziness, tingling, sensory change, focal weakness, seizures and headaches.  Endo/Heme/Allergies: Negative for polydipsia. Does not bruise/bleed easily.  Psychiatric/Behavioral: Negative for depression and memory loss. The patient is not nervous/anxious.     MEDICATIONS AT HOME:   Prior to Admission medications   Medication Sig Start Date  End Date Taking? Authorizing Provider  albuterol-ipratropium (COMBIVENT) 18-103 MCG/ACT inhaler Inhale 2 puffs into the lungs every 6 (six) hours as needed for wheezing. 06/19/13  Yes Minna Merritts, MD  amLODipine (NORVASC) 5 MG tablet Take 1 tablet (5 mg total) by mouth daily. 11/01/15  Yes Lauree Chandler, NP  aspirin EC 81 MG tablet Take 81 mg by mouth daily.   Yes Historical Provider, MD  azelastine (ASTELIN) 0.1 % nasal spray Place 1 spray into both nostrils as needed for rhinitis.    Yes Historical Provider, MD  benzonatate (TESSALON) 100 MG capsule Take 200 mg by mouth 3 (three) times daily as needed for cough.    Yes Historical Provider, MD  cyanocobalamin (,VITAMIN B-12,) 1000 MCG/ML injection Inject 1,000 mcg into the muscle every 30 (thirty) days.  Yes Historical Provider, MD  cycloSPORINE (RESTASIS) 0.05 % ophthalmic emulsion Place 1 drop into both eyes 2 (two) times daily.    Yes Historical Provider, MD  furosemide (LASIX) 20 MG tablet Take 1 tablet (20 mg total) by mouth every other day. 07/26/15  Yes Minna Merritts, MD  gabapentin (NEURONTIN) 300 MG capsule Take 1 capsule (300 mg total) by mouth 2 (two) times daily. 11/01/15  Yes Lauree Chandler, NP  HYDROcodone-acetaminophen (NORCO) 10-325 MG tablet Take 1 tablet by mouth every 4 (four) hours as needed for moderate pain.   Yes Historical Provider, MD  losartan (COZAAR) 25 MG tablet Take 25 mg by mouth daily.   Yes Historical Provider, MD  magnesium hydroxide (MILK OF MAGNESIA) 400 MG/5ML suspension Take 15 mLs by mouth daily as needed for mild constipation.    Yes Historical Provider, MD  metoprolol succinate (TOPROL-XL) 50 MG 24 hr tablet Take 1 tablet (50 mg total) by mouth daily. 11/01/15  Yes Lauree Chandler, NP  nitroGLYCERIN (NITROSTAT) 0.4 MG SL tablet Place 1 tablet (0.4 mg total) under the tongue every 5 (five) minutes as needed for chest pain. 01/24/13  Yes Minna Merritts, MD  pantoprazole (PROTONIX) 40 MG tablet  Take 40 mg by mouth 2 (two) times daily.     Yes Historical Provider, MD  phenazopyridine (PYRIDIUM) 200 MG tablet Take 1 tablet (200 mg total) by mouth 3 (three) times daily as needed for pain. 07/10/15 07/09/16 Yes Earleen Newport, MD  potassium chloride (K-DUR) 10 MEQ tablet Take 10 mEq by mouth daily.   Yes Historical Provider, MD  promethazine (PHENERGAN) 25 MG tablet Take 1 tablet (25 mg total) by mouth every 6 (six) hours as needed for nausea or vomiting. 06/17/14  Yes Minna Merritts, MD      VITAL SIGNS:  Blood pressure 135/85, pulse 88, temperature 97.8 F (36.6 C), temperature source Oral, resp. rate 18, weight 120.203 kg (265 lb), SpO2 100 %.  PHYSICAL EXAMINATION:  Physical Exam  GENERAL:  82 y.o.-year-old obese patient lying in the bed with no acute distress.  EYES: Pupils equal, round, reactive to light and accommodation. No scleral icterus. Extraocular muscles intact.  HEENT: Head atraumatic, normocephalic. Oropharynx and nasopharynx clear. No oropharyngeal erythema, moist oral mucosa  NECK:  Supple, no jugular venous distention. No thyroid enlargement, no tenderness.  LUNGS: Normal breath sounds bilaterally, diffuse end expiratory wheezing bilaterally, no rales, rhonchi. No use of accessory muscles of respiration.  CARDIOVASCULAR: S1, S2 RRR. No murmurs, rubs, gallops, clicks.  ABDOMEN: Soft, nontender, nondistended. Bowel sounds present. No organomegaly or mass.  EXTREMITIES: No pedal edema, cyanosis, or clubbing. + 2 pedal & radial pulses b/l.   NEUROLOGIC: Cranial nerves II through XII are intact. No focal Motor or sensory deficits appreciated b/l.  Globally weak.  PSYCHIATRIC: The patient is alert and oriented x 3. Good affect.  SKIN: No obvious rash, lesion, or ulcer.   LABORATORY PANEL:   CBC  Recent Labs Lab 03/13/16 1545  WBC 9.3  HGB 12.0  HCT 36.2  PLT 258    ------------------------------------------------------------------------------------------------------------------  Chemistries   Recent Labs Lab 03/13/16 1545  NA 137  K 3.9  CL 105  CO2 24  GLUCOSE 204*  BUN 18  CREATININE 0.66  CALCIUM 8.9  AST 31  ALT 25  ALKPHOS 111  BILITOT 0.6   ------------------------------------------------------------------------------------------------------------------  Cardiac Enzymes  Recent Labs Lab 03/13/16 1545  TROPONINI <0.03   ------------------------------------------------------------------------------------------------------------------  RADIOLOGY:  Dg  Chest 2 View  03/13/2016  CLINICAL DATA:  Chest pain.  Nausea and vomiting EXAM: CHEST  2 VIEW COMPARISON:  12/06/2015 FINDINGS: CABG.  Aortic valve replacement.  Dual lead pacemaker unchanged. Negative for heart failure. Lungs are clear without infiltrate or effusion. IMPRESSION: No active cardiopulmonary disease. Electronically Signed   By: Franchot Gallo M.D.   On: 03/13/2016 16:31     IMPRESSION AND PLAN:   82 year old female with past medical history of hypertension, hyperlipidemia, history of coronary artery disease status post bypass, history of aortic stenosis status post bovine aortic valve replacement, obesity, spinal stenosis, neuropathy, status post pacemaker, osteoarthritis, history of diastolic CHF, fibromyalgia who presents to the hospital with chest pain nausea and vomiting.  1. Chest pain-patient's pain is quite atypical in nature but she does have significant cardiac risk factors. -I will observe him on telemetry, cycle her cardiac markers. -Continue aspirin, beta blocker, nitroglycerin. I will get a cardiology consult.  2. Abnormal EKG/ventricular tachycardia-patient had episodes of ventricular tachycardia observed on telemetry monitor in the emergency room -Patient was clinically asymptomatic. Her electrolytes are stable. I will check a magnesium level.  Next-continue metoprolol, get a cardiology consult.  3. Essential hypertension-continue Toprol, losartan.  4. History of diastolic CHF-clinically patient was not in congestive heart failure. Continue Lasix, metoprolol, losartan.  5. GERD-continue Protonix.  6. Frequent falls/weakness-I will get a physical therapy consult to assess patient's mobility.    All the records are reviewed and case discussed with ED provider. Management plans discussed with the patient, family and they are in agreement.  CODE STATUS: Full  TOTAL TIME TAKING CARE OF THIS PATIENT: 45 minutes.    Henreitta Leber M.D on 03/13/2016 at 6:42 PM  Between 7am to 6pm - Pager - [phone]  After 6pm go to [email or web address] - password [email or web address]  Office  [phone]  CC: Primary care physician; [name]

## 2016-03-14 ENCOUNTER — Telehealth: Payer: Self-pay

## 2016-03-14 ENCOUNTER — Encounter: Payer: Self-pay | Admitting: *Deleted

## 2016-03-14 DIAGNOSIS — R296 Repeated falls: Secondary | ICD-10-CM | POA: Insufficient documentation

## 2016-03-14 DIAGNOSIS — I25709 Atherosclerosis of coronary artery bypass graft(s), unspecified, with unspecified angina pectoris: Secondary | ICD-10-CM

## 2016-03-14 DIAGNOSIS — Z952 Presence of prosthetic heart valve: Secondary | ICD-10-CM | POA: Insufficient documentation

## 2016-03-14 DIAGNOSIS — R112 Nausea with vomiting, unspecified: Secondary | ICD-10-CM

## 2016-03-14 DIAGNOSIS — R079 Chest pain, unspecified: Secondary | ICD-10-CM | POA: Diagnosis not present

## 2016-03-14 DIAGNOSIS — R29898 Other symptoms and signs involving the musculoskeletal system: Secondary | ICD-10-CM

## 2016-03-14 DIAGNOSIS — Z954 Presence of other heart-valve replacement: Secondary | ICD-10-CM

## 2016-03-14 DIAGNOSIS — R1110 Vomiting, unspecified: Secondary | ICD-10-CM | POA: Insufficient documentation

## 2016-03-14 LAB — BASIC METABOLIC PANEL
Anion gap: 6 (ref 5–15)
BUN: 19 mg/dL (ref 6–20)
CALCIUM: 8.5 mg/dL — AB (ref 8.9–10.3)
CHLORIDE: 104 mmol/L (ref 101–111)
CO2: 24 mmol/L (ref 22–32)
CREATININE: 0.61 mg/dL (ref 0.44–1.00)
GFR calc non Af Amer: >60 mL/min (ref >60)
GLUCOSE: 144 mg/dL — AB (ref 65–99)
Potassium: 3.9 mmol/L (ref 3.5–5.1)
Sodium: 134 mmol/L — ABNORMAL LOW (ref 135–145)

## 2016-03-14 LAB — CBC
HCT: 34 % — ABNORMAL LOW (ref 35.0–47.0)
Hemoglobin: 11.2 g/dL — ABNORMAL LOW (ref 12.0–16.0)
MCH: 27.8 pg (ref 26.0–34.0)
MCHC: 33.1 g/dL (ref 32.0–36.0)
MCV: 83.9 fL (ref 80.0–100.0)
PLATELETS: 225 10*3/uL (ref 150–440)
RBC: 4.05 MIL/uL (ref 3.80–5.20)
RDW: 16.2 % — ABNORMAL HIGH (ref 11.5–14.5)
WBC: 8 10*3/uL (ref 3.6–11.0)

## 2016-03-14 LAB — TROPONIN I: Troponin I: <0.03 ng/mL (ref <0.031)

## 2016-03-14 MED ORDER — TUBERCULIN PPD 5 UNIT/0.1ML ID SOLN
5.0000 [IU] | Freq: Once | INTRADERMAL | Status: DC
  Administered 2016-03-14: 5 [IU] via INTRADERMAL
  Filled 2016-03-14: qty 0.1

## 2016-03-14 MED ORDER — CARISOPRODOL 350 MG PO TABS: 350.0000 mg | ORAL_TABLET | Freq: Every day | ORAL | Status: DC

## 2016-03-14 MED ORDER — CARISOPRODOL 350 MG PO TABS
350.0000 mg | ORAL_TABLET | Freq: Once | ORAL | Status: AC
  Administered 2016-03-14: 350 mg via ORAL
  Filled 2016-03-14: qty 1

## 2016-03-14 NOTE — Progress Notes (Signed)
Patient requested for pain medication for toothache and generalized pain  of 10/10 on a pain scale and it wasn't time to get the Vicodin. Dr. Marcille Blanco notified with a new order for Soma 350 mg  oral x 1 dose and soma 350 mg oral at bedtime. No acute distress noted. Will continue to monitor.

## 2016-03-14 NOTE — Telephone Encounter (Signed)
-----   Message from Arie Sabina sent at 03/14/2016  1:23 PM EDT ----- Regarding: tcm/ph 03/24/16 2:30 Ignacia Bayley

## 2016-03-14 NOTE — Evaluation (Signed)
Physical Therapy Evaluation Patient Details Name: Michele Schultz MRN: [id number] DOB: February 22, 1931 Today's Date: 03/14/2016   History of Present Illness  Pt admitted for complaints of chest pain and nausea x 3 days. Pt with history of HTN, CAD and is s/p bypass Sx. Pt has aortic stenosis and is s/p bovine aortic valve. Pt reports history of multiple falls each day secondary to weakness in B LEs.  Clinical Impression  Pt is a pleasant 82 year old female who was admitted for complaints of chest pain and nausea. Pt performs bed mobility with mod assist, transfers with min assist, and ambulation with min assist and rw. Pt fatigues quickly with all mobility and request to sit back on bed secondary to R LE giving out during ambulation. Heavy cues required for all mobility and sequencing. Pt demonstrates deficits with strength/balance/dizziness/mobility. Pt is not safe to dc back to ILF at this time. Would benefit from skilled PT to address above deficits and promote optimal return to PLOF; recommend transition to STR upon discharge from acute hospitalization.       Follow Up Recommendations SNF    Equipment Recommendations       Recommendations for Other Services       Precautions / Restrictions Precautions Precautions: Fall Restrictions Weight Bearing Restrictions: No      Mobility  Bed Mobility Overal bed mobility: Needs Assistance Bed Mobility: Supine to Sit     Supine to sit: Mod assist     General bed mobility comments: assist for B LE and cues for sequencing of tasks. Once seated at EOB, assistance needing for trunk righting. Pt in upright posture and able to maintain balance. Complaints of dizziness noted with position change, however decreased symptoms noted with rest break for 2-3 mins  Transfers Overall transfer level: Needs assistance Equipment used: Rolling walker (2 wheeled) Transfers: Sit to/from Stand Sit to Stand: Min assist         General transfer comment:  assist for elevation of trunk. Heavy B UE WB noted. Elevated bed required as pt has increased pain with knee flexion. Once standing, dizziness reported with forward flexed posture  Ambulation/Gait Ambulation/Gait assistance: Min assist Ambulation Distance (Feet): 3 Feet Assistive device: Rolling walker (2 wheeled) Gait Pattern/deviations: Step-to pattern     General Gait Details: Heavy cues for sequencing and step to gait pattern performed. Pt side stepped up towards [place], however fatigues quickly and needs assistance for controlled descent back to bed secodnary to R LE weakness  Stairs            Wheelchair Mobility    Modified Rankin (Stroke Patients Only)       Balance Overall balance assessment: History of Falls;Needs assistance Sitting-balance support: Feet supported;Single extremity supported Sitting balance-Leahy Scale: Good     Standing balance support: Bilateral upper extremity supported Standing balance-Leahy Scale: Fair                               Pertinent Vitals/Pain Pain Assessment: Faces Faces Pain Scale: Hurts little more Pain Location: R knee with movement Pain Descriptors / Indicators: Dull;Discomfort Pain Intervention(s): Limited activity within patient's tolerance    Home Living Family/patient expects to be discharged to:: Other (Comment)                 Additional Comments: from [place]    Prior Function Level of Independence: Independent with assistive device(s)  Comments: Per patient she has been ambulating with RW for short distances around her home recently.  She uses rollater more frequently     Hand Dominance        Extremity/Trunk Assessment   Upper Extremity Assessment: Generalized weakness (grossly 3+/5; decreased shoulder flexion noted on R side)           Lower Extremity Assessment: Generalized weakness (R < L; 3/5 quad; 2/5 dorsiflexion R LE)         Communication    Communication: No difficulties  Cognition Arousal/Alertness: Awake/alert Behavior During Therapy: WFL for tasks assessed/performed Overall Cognitive Status: Within Functional Limits for tasks assessed                      General Comments      Exercises Other Exercises Other Exercises: Supine ther-ex performed on B LE including ankle pumps, quad sets, and hip abd/add. All ther-ex performed x 10 reps with cues for sequencing. Min assist given for correct technique.      Assessment/Plan    PT Assessment Patient needs continued PT services  PT Diagnosis Difficulty walking;Generalized weakness   PT Problem List Decreased strength;Decreased activity tolerance;Decreased balance;Decreased mobility;Pain  PT Treatment Interventions DME instruction;Gait training;Therapeutic activities;Therapeutic exercise   PT Goals (Current goals can be found in the Care Plan section) Acute Rehab PT Goals Patient Stated Goal: to get stronger PT Goal Formulation: With patient Time For Goal Achievement: 03/28/16 Potential to Achieve Goals: Good    Frequency Min 2X/week   Barriers to discharge Decreased caregiver support      Co-evaluation               End of Session Equipment Utilized During Treatment: Gait belt Activity Tolerance: Patient limited by fatigue;Patient limited by pain Patient left: in bed;with bed alarm set;with nursing/sitter in room Nurse Communication: Mobility status    Functional Assessment Tool Used: clinical judgement Functional Limitation: Mobility: Walking and moving around Mobility: Walking and Moving Around Current Status [id number]): At least 60 percent but less than 80 percent impaired, limited or restricted Mobility: Walking and Moving Around Goal Status [phone]): At least 40 percent but less than 60 percent impaired, limited or restricted    Time: 1016-1039 PT Time Calculation (min) (ACUTE ONLY): 23 min   Charges:   PT Evaluation $PT Eval Moderate  Complexity: 1 Procedure PT Treatments $Therapeutic Exercise: 8-22 mins   PT G Codes:   PT G-Codes **NOT FOR INPATIENT CLASS** Functional Assessment Tool Used: clinical judgement Functional Limitation: Mobility: Walking and moving around Mobility: Walking and Moving Around Current Status [id number]): At least 60 percent but less than 80 percent impaired, limited or restricted Mobility: Walking and Moving Around Goal Status [phone]): At least 40 percent but less than 60 percent impaired, limited or restricted    Jalayna Josten 03/14/2016, 10:50 AM  Greggory Stallion, PT, DPT [phone]

## 2016-03-14 NOTE — Discharge Summary (Signed)
[facility] NAME: Michele Schultz    MR#:  [id number]  DATE OF BIRTH:  22-Oct-1931  DATE OF ADMISSION:  03/13/2016 ADMITTING PHYSICIAN: Henreitta Leber, MD  DATE OF DISCHARGE:03/14/2016 PRIMARY CARE PHYSICIAN: [name]    ADMISSION DIAGNOSIS:  Chest pain, unspecified chest pain type [R07.9] Nausea and vomiting, vomiting of unspecified type [R11.2]  DISCHARGE DIAGNOSIS:  Active Problems:   Chest pain   Pain in the chest   Emesis   Weakness of both legs   Coronary artery disease involving coronary bypass graft of native heart with unspecified angina pectoris   S/P AVR (aortic valve replacement)   Falls frequently   SECONDARY DIAGNOSIS:   Past Medical History  Diagnosis Date  . Hypertension   . Anemia   . Neuralgia, neuritis, and radiculitis, unspecified   . Coronary artery disease     a. s/p 2v CABG 11/12 (VG-LAD, VG-OM1); b. Lexiscan 08/2015: normal study  . Aortic stenosis, severe     a. s/p Magna Ease pericardial tissue valve size 21 mm replacement in 10/2011 for severe AS 11/12; b. echo 07/2015: EF 55-60% mod concentric LVH, GR1DD, LA mildly dilated, PASP 45 mm Hg  . Onychia and paronychia of toe   . Osteoarthrosis, unspecified whether generalized or localized, pelvic region and thigh     mainly in her back and knees  . Enthesopathy of hip region   . Esophageal reflux     followed by Dr.Seigal. stabilized with a combination of Nexium and Zantac  . Knee joint replacement by other means   . Stress incontinence, female     followed by Dr.Cope  . Complete heart block [facility])     a. s/p St Jude PPM 10/2011; b. SN # [id number]  . Cardiac pacemaker -st Judes     11/12  . Chronic diastolic heart failure ([place])     a. echo 2014: EF 55-60%, no RWMA, GR1DD, PASP 47 mm Hg; b. echo 2012L EF 55-60%, GR1DD, mild MR  . Type II or unspecified type diabetes mellitus without mention of complication, not stated as  uncontrolled     pt. reports that she is borderline   . Spinal stenosis   . Neuropathy ([place])   . Morbid obesity ([place])   . RAD (reactive airway disease)     a. chronic SOB  . Fibromyalgia   . HLD (hyperlipidemia)   . CHF (congestive heart failure) [facility])     HOSPITAL COURSE:   82 year old female with history of CAD and essential hypertension who presented with chest pain. For further details please refer the H&P.   1. Atypical chest pain: Patient was ruled out for ACS with 3 negative troponins.   she had a recent negative Lexiscan Myoview. [place] cardiology consultation. No further plans for ischemic workup.  2. Frequent falls: Physical therapy recommended skilled nursing facility. Patient was Medicare observation and therefore does not qualify for skilled nursing facility. Patient will be discharged with home with home health care.   3. History of breath prostatic aortic valve: No acute issues at this time.   4. History of CHP: Patient status post pacemaker. She will follow up for outpatient interrogation.  5. Chronic diastolic heart failure: Patient is euvolemic Continue Lasix and metoprolol  6. PVC: No further workup is indicated.    DISCHARGE CONDITIONS AND DIET:   Patient will be discharged home with home health care on a heart healthy diet   CONSULTS  OBTAINED:     DRUG ALLERGIES:   Allergies  Allergen Reactions  . Citalopram Other (See Comments)    Reaction:  Altered mental status   . Cymbalta [Duloxetine Hcl] Other (See Comments)    Reaction:  Sedative for pt   . Imipramine Other (See Comments)    Reaction:  Unknown   . Proton Pump Inhibitors Other (See Comments)    Reaction:  Unknown   . Venlafaxine Nausea And Vomiting and Other (See Comments)    Reaction:  Dizziness     DISCHARGE MEDICATIONS:   Current Discharge Medication List    CONTINUE these medications which have NOT CHANGED   Details  albuterol-ipratropium (COMBIVENT) 18-103 MCG/ACT  inhaler Inhale 2 puffs into the lungs every 6 (six) hours as needed for wheezing. Qty: 1 Inhaler, Refills: 4    amLODipine (NORVASC) 5 MG tablet Take 1 tablet (5 mg total) by mouth daily. Qty: 30 tablet, Refills: 6    aspirin EC 81 MG tablet Take 81 mg by mouth daily.    azelastine (ASTELIN) 0.1 % nasal spray Place 1 spray into both nostrils as needed for rhinitis.     benzonatate (TESSALON) 100 MG capsule Take 200 mg by mouth 3 (three) times daily as needed for cough.     cyanocobalamin (,VITAMIN B-12,) 1000 MCG/ML injection Inject 1,000 mcg into the muscle every 30 (thirty) days.    cycloSPORINE (RESTASIS) 0.05 % ophthalmic emulsion Place 1 drop into both eyes 2 (two) times daily.     furosemide (LASIX) 20 MG tablet Take 1 tablet (20 mg total) by mouth every other day. Qty: 30 tablet, Refills: 6    gabapentin (NEURONTIN) 300 MG capsule Take 1 capsule (300 mg total) by mouth 2 (two) times daily. Qty: 60 capsule, Refills: 2    HYDROcodone-acetaminophen (NORCO) 10-325 MG tablet Take 1 tablet by mouth every 4 (four) hours as needed for moderate pain.    losartan (COZAAR) 25 MG tablet Take 25 mg by mouth daily.    magnesium hydroxide (MILK OF MAGNESIA) 400 MG/5ML suspension Take 15 mLs by mouth daily as needed for mild constipation.     metoprolol succinate (TOPROL-XL) 50 MG 24 hr tablet Take 1 tablet (50 mg total) by mouth daily. Qty: 30 tablet, Refills: 2    nitroGLYCERIN (NITROSTAT) 0.4 MG SL tablet Place 1 tablet (0.4 mg total) under the tongue every 5 (five) minutes as needed for chest pain. Qty: 90 tablet, Refills: 3    pantoprazole (PROTONIX) 40 MG tablet Take 40 mg by mouth 2 (two) times daily.      phenazopyridine (PYRIDIUM) 200 MG tablet Take 1 tablet (200 mg total) by mouth 3 (three) times daily as needed for pain. Qty: 20 tablet, Refills: 0    potassium chloride (K-DUR) 10 MEQ tablet Take 10 mEq by mouth daily.    promethazine (PHENERGAN) 25 MG tablet Take 1 tablet  (25 mg total) by mouth every 6 (six) hours as needed for nausea or vomiting. Qty: 30 tablet, Refills: 0              Today   CHIEF COMPLAINT:   Patient is complaining of weakness and falls. Patient denies chest pain    VITAL SIGNS:  Blood pressure 142/96, pulse 93, temperature 97.4 F (36.3 C), temperature source Oral, resp. rate 19, weight 120.203 kg (265 lb), SpO2 96 %.   REVIEW OF SYSTEMS:  Review of Systems  Constitutional: Negative for fever, chills and malaise/fatigue.  HENT: Negative for ear  discharge, ear pain, hearing loss, nosebleeds and sore throat.   Eyes: Negative for blurred vision and pain.  Respiratory: Negative for cough, hemoptysis, shortness of breath and wheezing.   Cardiovascular: Negative for chest pain, palpitations and leg swelling.  Gastrointestinal: Negative for nausea, vomiting, abdominal pain, diarrhea and blood in stool.  Genitourinary: Negative for dysuria.  Musculoskeletal: Positive for joint pain and falls. Negative for back pain.  Neurological: Negative for dizziness, tremors, speech change, focal weakness, seizures, weakness and headaches.  Endo/Heme/Allergies: Does not bruise/bleed easily.  Psychiatric/Behavioral: Negative for depression, suicidal ideas and hallucinations.     PHYSICAL EXAMINATION:  GENERAL:  82 y.o.-year-old patient lying in the bed with no acute distress.  NECK:  Supple, no jugular venous distention. No thyroid enlargement, no tenderness.  LUNGS: Normal breath sounds bilaterally, no wheezing, rales,rhonchi  No use of accessory muscles of respiration.  CARDIOVASCULAR: S1, S2 normal. No murmurs, rubs, or gallops.  ABDOMEN: Soft, non-tender, non-distended. Bowel sounds present. No organomegaly or mass.  EXTREMITIES: No pedal edema, cyanosis, or clubbing.  PSYCHIATRIC: The patient is alert and oriented x 3.  SKIN: No obvious rash, lesion, or ulcer.   DATA REVIEW:   CBC  Recent Labs Lab 03/14/16 0429  WBC 8.0   HGB 11.2*  HCT 34.0*  PLT 225    Chemistries   Recent Labs Lab 03/13/16 1545 03/14/16 0429  NA 137 134*  K 3.9 3.9  CL 105 104  CO2 24 24  GLUCOSE 204* 144*  BUN 18 19  CREATININE 0.66 0.61  CALCIUM 8.9 8.5*  MG 2.0  --   AST 31  --   ALT 25  --   ALKPHOS 111  --   BILITOT 0.6  --     Cardiac Enzymes  Recent Labs Lab 03/13/16 2051 03/14/16 0041 03/14/16 0429  TROPONINI <0.03 <0.03 <0.03    Microbiology Results  @[email or web address] @  RADIOLOGY:  Dg Chest 2 View  03/13/2016  CLINICAL DATA:  Chest pain.  Nausea and vomiting EXAM: CHEST  2 VIEW COMPARISON:  12/06/2015 FINDINGS: CABG.  Aortic valve replacement.  Dual lead pacemaker unchanged. Negative for heart failure. Lungs are clear without infiltrate or effusion. IMPRESSION: No active cardiopulmonary disease. Electronically Signed   By: Franchot Gallo M.D.   On: 03/13/2016 16:31      Management plans discussed with the patient and she is in agreement. Stable for discharge home with [facility]  Patient should follow up with CARDIOLOGY 1 week  CODE STATUS:     Code Status Orders        Start     Ordered   03/13/16 2016  Full code   Continuous     03/13/16 2015    Code Status History    Date Active Date Inactive Code Status Order ID Comments User Context   09/13/2015  6:51 PM 09/18/2015  7:31 PM Full Code [id number]  Dustin Flock, MD Inpatient   10/20/2011  1:40 PM 10/22/2011  1:27 PM Full Code [id number]  Tiajuana Amass, RN Inpatient    Advance Directive Documentation        Most Recent Value   Type of Advance Directive  Healthcare Power of Attorney   Pre-existing out of facility DNR order (yellow form or pink MOST form)     "MOST" Form in Place?        TOTAL TIME TAKING CARE OF THIS PATIENT: 35 minutes.    Note: This dictation was prepared with Dragon dictation along with smaller phrase  technology. Any transcriptional errors that result from this process are unintentional.  Myranda Pavone M.D on 03/14/2016  at 1:09 PM  Between 7am to 6pm - Pager - [phone] After 6pm go to [email or web address] - password [email or web address] [facility]  Office  [phone]  CC: Primary care physician; [name]

## 2016-03-14 NOTE — Care Management Note (Signed)
Case Management Note  Patient Details  Name: TRINADY MILEWSKI MRN: [id number] Date of Birth: 1931-01-15  Subjective/Objective:   CM consult for discharge planning.  Met with patient at bedside. She lives at [facility]. She reports multiple falls. Uses a rollator. Patient requesting to be discharged to a assisted living facility. CSW updated. Denies issues obtaining transportation, copays or medical care. Her PCP [facility] with [facility].   She has no children. Her family helps her when possible. She hires people to provide transportation.               Action/Plan: PT consult.   Expected Discharge Date:                  Expected Discharge Plan:     In-House Referral:     Discharge planning Services  CM Consult  Post Acute Care Choice:    Choice offered to:     DME Arranged:    DME Agency:     HH Arranged:    HH Agency:     Status of Service:  In process, will continue to follow  Medicare Important Message Given:    Date Medicare IM Given:    Medicare IM give by:    Date Additional Medicare IM Given:    Additional Medicare Important Message give by:     If discussed at [place] of Stay Meetings, dates discussed:    Additional Comments:  Jolly Mango, RN 03/14/2016, 9:27 AM

## 2016-03-14 NOTE — Care Management (Addendum)
Family present and addressing concerns over discharge plans.  Family members present: nephew and nieces:  Sallee Provencal, Rebekah Chesterfield and Gerarda Fraction. In the presence of the unit director, discussed the broad plan of assisted living placement and the barrier of the incomplete medicaid application.  Discussed the need to identify lacking items- one which is apparently clinical information.  Discussed that even though a home health social worker will be providing assist, the patient is going to need the assistance of her family members to collect and provide the information needed to complete the application.  Discussed  that patient could use financial resources that is currently being used for services at [facility] and her  social security/retirement benefits to go directly into an assisted living while medicaid application is in progress,  but this would have to be negotiated  with specific facility.  Discussed the need to actively start looking at facilities and include patient.  Have looked at [facility].  CM added that would have to find facility that would accept medicaid and that patient would have to use additional resources of her own to cover the cost.  Patient has rollator walker but no wheelchair.  She has not walked to the dining room in over one year. Discussed with family members the need to have family and friends rally around patient to provide in home assistance while working on long range plan to find and secure an assisted living facility.  CM attempt to identify items needed to complete the medicaid application and communicate this to niece Gerarda Fraction.  Patient and family members verbalize agreement and understanding with plan

## 2016-03-14 NOTE — Progress Notes (Addendum)
PPD test administered to R forearm. 03/14/2016 11:18 AM

## 2016-03-14 NOTE — Consult Note (Signed)
Cardiology Consultation Note  Patient ID: Michele Schultz, MRN: [id number], DOB/AGE: March 03, 1931 82 y.o. Admit date: 03/13/2016   Date of Consult: 03/14/2016 Primary Physician: [name] Primary Cardiologist: Dr. Rockey Situ, MD  Chief Complaint: Chest pain Reason for Consult: Chest pain  HPI: 82 y.o. female with h/o CAD s/p 2v CABG (SVG-LAD and SVG-OM1) in 10/2011 and residual mid left main and ostial LCx disease, severe aortic stenosis s/p Magna Ease pericardial tissue valve size 21 mm replacement in [id number] complicated by postoperative complete heart block requiring St Judes PPM, chronic diastolic CHF, chronic SOB, RAD, diabetes, HTN, HLD, obesity, chronic fatigue, severe back pain, spinal stenosis, and fibromyalgia who presented to [facility] on 4/10 with   No ischemic evaluations since her bypass surgery. She has longstanding fatigue and reports sleeping for long periods of time, though these are not new symptoms for her. She has been being weaned off her pain medications by her PCP over the fall to winter months which was leading to increased chronic back pain. She was last seen in the office on 08/25/2015 with complaint of developing chest pain that lasted 30 minutes s/p eating dinner with associated SOB. She was scheduled for a [employer], which she completed on 09/02/15 and was low risk with no ST segment deviation noted. There was TWI noted during stress in leads I, aVL, and V2. EF 55-65%. Last PPM interrogation was 9/28 with no changes being made at that time. She has since been admitted to [facility] in 09/2015 with nausea, vomiting, and diarrhea. No active cardiac issues at that time.   She presented to [facility] on 4/10 with complaints of multiple frequent falls after her right leg gives out on her s/p standing up. She is falling at least once per day because of this per her report. She reports using a walker, though still falls. Interestingly there is no significant bruising. She also gets  dizzy with positional changes. She has had some fleeting "gas pains" that last approximately 60 seconds and self resolve. Not exertional. No associated symptoms. She has stable 2 pillow orthopnea. No early satiety. No LE edema. She reports some palpitations that are not associated with the above.    Upon the patient's arrival to [facility] they were found to have troponin negative x 3, no acute findings on bmet or cbc, lipase 36. ECG as below, CXR showed no active cardiopulmonary disease. She was continued on home medications and cardiology consult was placed. She currently asymptomatic.   Past Medical History  Diagnosis Date  . Hypertension   . Anemia   . Neuralgia, neuritis, and radiculitis, unspecified   . Coronary artery disease     a. s/p 2v CABG 11/12 (VG-LAD, VG-OM1); b. Lexiscan 08/2015: normal study  . Aortic stenosis, severe     a. s/p Magna Ease pericardial tissue valve size 21 mm replacement in 10/2011 for severe AS 11/12; b. echo 07/2015: EF 55-60% mod concentric LVH, GR1DD, LA mildly dilated, PASP 45 mm Hg  . Onychia and paronychia of toe   . Osteoarthrosis, unspecified whether generalized or localized, pelvic region and thigh     mainly in her back and knees  . Enthesopathy of hip region   . Esophageal reflux     followed by Dr.Seigal. stabilized with a combination of Nexium and Zantac  . Knee joint replacement by other means   . Stress incontinence, female     followed by Dr.Cope  . Complete heart block (HCC)     a. s/p  St Jude PPM 10/2011; b. SN # [id number]  . Cardiac pacemaker -st Judes     11/12  . Chronic diastolic heart failure ([place])     a. echo 2014: EF 55-60%, no RWMA, GR1DD, PASP 47 mm Hg; b. echo 2012L EF 55-60%, GR1DD, mild MR  . Type II or unspecified type diabetes mellitus without mention of complication, not stated as uncontrolled     pt. reports that she is borderline   . Spinal stenosis   . Neuropathy ([place])   . Morbid obesity ([place])   . RAD (reactive airway  disease)     a. chronic SOB  . Fibromyalgia   . HLD (hyperlipidemia)   . CHF (congestive heart failure) ([place])       Most Recent Cardiac Studies: Echo 07/2015: Study Conclusions - Left ventricle: The cavity size was normal. There was moderate  concentric hypertrophy. Systolic function was normal. The  estimated ejection fraction was in the range of 55% to 60%. Wall  motion was normal; there were no regional wall motion  abnormalities. Doppler parameters are consistent with abnormal  left ventricular relaxation (grade 1 diastolic dysfunction). - Aortic valve: There was mild stenosis. Mean gradient (S): 14 mm  Hg. - Left atrium: The atrium was mildly dilated. - Pulmonary arteries: Systolic pressure was mildly to moderately  increased. PA peak pressure: 45 mm Hg (S). Impressions: - The aortic valve is not well seen but no significant change in  gradient since last study.   Surgical History:  Past Surgical History  Procedure Laterality Date  . Replacement total knee  11/2006    right knee  . Nasal sinus surgery  2009  . Total hip arthroplasty  09/2010  . Cardiac catheterization  08/2009    50% stenosis distal left main, 50% stenosis ostial left circumflex.   . Cardiac catheterization      at [facility]  . Eye surgery      IOL/ after cataracts removed- bilateral   . Aortic valve replacement  10/17/2011    Procedure: AORTIC VALVE REPLACEMENT (AVR);  Surgeon: Melrose Nakayama, MD;  Location: [place];  Service: Open Heart Surgery;  Laterality: N/A;  . Coronary artery bypass graft  10/17/2011    Procedure: CORONARY ARTERY BYPASS GRAFTING (CABG);  Surgeon: Melrose Nakayama, MD;  Location: [place];  Service: Open Heart Surgery;  Laterality: N/A;  Times Two, using right leg greater saphenous vein harvested endoscopically  . Permanent pacemaker insertion N/A 10/20/2011    Procedure: PERMANENT PACEMAKER INSERTION;  Surgeon: Thompson Grayer, MD;  Location: [facility] CATH LAB;  Service:  Cardiovascular;  Laterality: N/A;     Home Meds: Prior to Admission medications   Medication Sig Start Date End Date Taking? Authorizing Provider  albuterol-ipratropium (COMBIVENT) 18-103 MCG/ACT inhaler Inhale 2 puffs into the lungs every 6 (six) hours as needed for wheezing. 06/19/13  Yes Minna Merritts, MD  amLODipine (NORVASC) 5 MG tablet Take 1 tablet (5 mg total) by mouth daily. 11/01/15  Yes Lauree Chandler, NP  aspirin EC 81 MG tablet Take 81 mg by mouth daily.   Yes Historical Provider, MD  azelastine (ASTELIN) 0.1 % nasal spray Place 1 spray into both nostrils as needed for rhinitis.    Yes Historical Provider, MD  benzonatate (TESSALON) 100 MG capsule Take 200 mg by mouth 3 (three) times daily as needed for cough.    Yes Historical Provider, MD  cyanocobalamin (,VITAMIN B-12,) 1000 MCG/ML injection Inject 1,000 mcg into the muscle  every 30 (thirty) days.   Yes Historical Provider, MD  cycloSPORINE (RESTASIS) 0.05 % ophthalmic emulsion Place 1 drop into both eyes 2 (two) times daily.    Yes Historical Provider, MD  furosemide (LASIX) 20 MG tablet Take 1 tablet (20 mg total) by mouth every other day. 07/26/15  Yes Minna Merritts, MD  gabapentin (NEURONTIN) 300 MG capsule Take 1 capsule (300 mg total) by mouth 2 (two) times daily. 11/01/15  Yes Lauree Chandler, NP  HYDROcodone-acetaminophen (NORCO) 10-325 MG tablet Take 1 tablet by mouth every 4 (four) hours as needed for moderate pain.   Yes Historical Provider, MD  losartan (COZAAR) 25 MG tablet Take 25 mg by mouth daily.   Yes Historical Provider, MD  magnesium hydroxide (MILK OF MAGNESIA) 400 MG/5ML suspension Take 15 mLs by mouth daily as needed for mild constipation.    Yes Historical Provider, MD  metoprolol succinate (TOPROL-XL) 50 MG 24 hr tablet Take 1 tablet (50 mg total) by mouth daily. 11/01/15  Yes Lauree Chandler, NP  nitroGLYCERIN (NITROSTAT) 0.4 MG SL tablet Place 1 tablet (0.4 mg total) under the tongue every 5  (five) minutes as needed for chest pain. 01/24/13  Yes Minna Merritts, MD  pantoprazole (PROTONIX) 40 MG tablet Take 40 mg by mouth 2 (two) times daily.     Yes Historical Provider, MD  phenazopyridine (PYRIDIUM) 200 MG tablet Take 1 tablet (200 mg total) by mouth 3 (three) times daily as needed for pain. 07/10/15 07/09/16 Yes Earleen Newport, MD  potassium chloride (K-DUR) 10 MEQ tablet Take 10 mEq by mouth daily.   Yes Historical Provider, MD  promethazine (PHENERGAN) 25 MG tablet Take 1 tablet (25 mg total) by mouth every 6 (six) hours as needed for nausea or vomiting. 06/17/14  Yes Minna Merritts, MD    Inpatient Medications:  . amLODipine  5 mg Oral Daily  . aspirin EC  81 mg Oral Daily  . carisoprodol  350 mg Oral QHS  . cyanocobalamin  1,000 mcg Intramuscular Q30 days  . cycloSPORINE  1 drop Both Eyes BID  . enoxaparin (LOVENOX) injection  40 mg Subcutaneous Q12H  . furosemide  20 mg Oral QODAY  . gabapentin  300 mg Oral BID  . losartan  25 mg Oral Daily  . metoprolol succinate  50 mg Oral Daily  . pantoprazole  40 mg Oral BID  . potassium chloride  10 mEq Oral Daily  . sodium chloride flush  3 mL Intravenous Q12H      Allergies:  Allergies  Allergen Reactions  . Citalopram Other (See Comments)    Reaction:  Altered mental status   . Cymbalta [Duloxetine Hcl] Other (See Comments)    Reaction:  Sedative for pt   . Imipramine Other (See Comments)    Reaction:  Unknown   . Proton Pump Inhibitors Other (See Comments)    Reaction:  Unknown   . Venlafaxine Nausea And Vomiting and Other (See Comments)    Reaction:  Dizziness     Social History   Social History  . Marital Status: Widowed    Spouse Name: N/A  . Number of Children: N/A  . Years of Education: N/A   Occupational History  . Not on file.   Social History Main Topics  . Smoking status: Never Smoker   . Smokeless tobacco: Never Used  . Alcohol Use: No  . Drug Use: No  . Sexual Activity: Not on file  Other Topics Concern  . Not on file   Social History Narrative     Family History  Problem Relation Age of Onset  . Diabetes Other   . Heart disease Sister   . Heart disease Brother   . Heart disease Brother   . Heart disease Brother   . Anesthesia problems Neg Hx   . Hypotension Neg Hx   . Malignant hyperthermia Neg Hx   . Pseudochol deficiency Neg Hx      Review of Systems: Review of Systems  Constitutional: Positive for malaise/fatigue. Negative for fever, chills, weight loss and diaphoresis.  HENT: Negative for congestion.   Eyes: Negative for discharge and redness.  Respiratory: Negative for cough, hemoptysis, sputum production, shortness of breath and wheezing.   Cardiovascular: Positive for chest pain, palpitations and leg swelling. Negative for orthopnea, claudication and PND.       Right knee swelling  Gastrointestinal: Positive for heartburn, nausea and vomiting. Negative for abdominal pain.  Musculoskeletal: Positive for myalgias, back pain, joint pain and falls. Negative for neck pain.  Skin: Negative for rash.  Neurological: Positive for dizziness and weakness. Negative for tingling, tremors, sensory change, speech change, focal weakness, seizures and loss of consciousness.  Endo/Heme/Allergies: Does not bruise/bleed easily.  Psychiatric/Behavioral: The patient is not nervous/anxious.   All other systems reviewed and are negative.   Labs:  Recent Labs  03/13/16 1545 03/13/16 2051 03/14/16 0041 03/14/16 0429  TROPONINI <0.03 <0.03 <0.03 <0.03   Lab Results  Component Value Date   WBC 8.0 03/14/2016   HGB 11.2* 03/14/2016   HCT 34.0* 03/14/2016   MCV 83.9 03/14/2016   PLT 225 03/14/2016    Recent Labs Lab 03/13/16 1545 03/14/16 0429  NA 137 134*  K 3.9 3.9  CL 105 104  CO2 24 24  BUN 18 19  CREATININE 0.66 0.61  CALCIUM 8.9 8.5*  PROT 7.4  --   BILITOT 0.6  --   ALKPHOS 111  --   ALT 25  --   AST 31  --   GLUCOSE 204* 144*   No  results found for: CHOL, HDL, LDLCALC, TRIG No results found for: DDIMER  Radiology/Studies:  Dg Chest 2 View  03/13/2016  CLINICAL DATA:  Chest pain.  Nausea and vomiting EXAM: CHEST  2 VIEW COMPARISON:  12/06/2015 FINDINGS: CABG.  Aortic valve replacement.  Dual lead pacemaker unchanged. Negative for heart failure. Lungs are clear without infiltrate or effusion. IMPRESSION: No active cardiopulmonary disease. Electronically Signed   By: Franchot Gallo M.D.   On: 03/13/2016 16:31    EKG: NSR, 90 bpm, nonspecific anterolateral st/t changes   Weights: Filed Weights   03/13/16 1536  Weight: 265 lb (120.203 kg)     Physical Exam: Blood pressure 135/74, pulse 68, temperature 98 F (36.7 C), temperature source Oral, resp. rate 20, weight 265 lb (120.203 kg), SpO2 97 %. Body mass index is 45.46 kg/(m^2). General: Well developed, well nourished, in no acute distress. Head: Normocephalic, atraumatic, sclera non-icteric, no xanthomas, nares are without discharge.  Neck: Negative for carotid bruits. JVD not elevated. Lungs: Clear bilaterally to auscultation without wheezes, rales, or rhonchi. Breathing is unlabored. Heart: RRR with S1 S2. No murmurs, rubs, or gallops appreciated. Abdomen: Obese, soft, non-tender, non-distended with normoactive bowel sounds. No hepatomegaly. No rebound/guarding. No obvious abdominal masses. Msk:  Strength and tone appear normal for age. Extremities: No clubbing or cyanosis. No edema.  Distal pedal pulses are 2+ and equal bilaterally. Right  knee with STS, no TTP. No significant bruising to account for daily falls.  Neuro: Alert and oriented X 3. No facial asymmetry. No focal deficit. Moves all extremities spontaneously. Psych:  Responds to questions appropriately with a normal affect.    Assessment and Plan:   1. Atypical chest pain/reflux: -Troponin negative x 3 -Recent negative Lexiscan Myoview -She has had multiple sub-acute somatic complaints since her  surgery in early 2000's. The only new complaint at this time is her frequent falling, though there are no true bruises to account for multiple daily falls -Consider adding Imdur 30 mg daily -No plans for further ischemic work up at this time  2. Frequent falls: -As above -Has gained large amounts of weight, waist up (251 in September, 265 today), with frail legs, likely playing a role as well -Would benefit from PT evaluation -May need inpatient rehab -Was in a wheelchair when she was last seen in the clinic -She reports her right leg "gives out" on her daily -Defer to IM -Check orthostatics  3. CAD s/p CABG as above: -As above -Continue aspirin 81 mg, Toprol XL 50 mg  4. History of bioprosthetic aortic valve: -Recent echo as above -No need to repeat at this time -Consider outpatient repeating  5. History of CHB: -Status post SJM PPM -Plan of outpatient interrogation   6. Chronic diastolic CHF: -She does not appear to be volume overloaded -Toprol as above -Lasix 20 mg daily  7. Fibromyalgia: -Likely paying a role in her complaints   8. Dispo: -PPD placed for need for assisted living    SignedChristell Faith, PA-C Pager: [phone] 03/14/2016, 9:03 AM

## 2016-03-14 NOTE — Progress Notes (Signed)
Pt. Discharged to home @ [facility] via EMS. Discharge instructions and medication regimen reviewed at bedside with patient. Pt. verbalizes understanding of instructions and medication regimen. Patient assessment unchanged from this morning. TELE and IV discontinued per policy.

## 2016-03-14 NOTE — Clinical Social Work Note (Signed)
Clinical Social Work Assessment  Patient Details  Name: Michele Schultz MRN: [id number] Date of Birth: 1930/12/31  Date of referral:  03/14/16               Reason for consult:  Facility Placement, Other (Comment Required) (Patient is Medicare Observation. )                Permission sought to share information with:  Case Manager, Other Vance Gather [facility] ) Permission granted to share information::  Yes, Verbal Permission Granted  Name::        Agency::     Relationship::     Contact Information:     Housing/Transportation Living arrangements for the past 2 months:  [place] of Information:  Patient Patient Interpreter Needed:  None Criminal Activity/Legal Involvement Pertinent to Current Situation/Hospitalization:  No - Comment as needed Significant Relationships:  None Lives with:  Self Do you feel safe going back to the place where you live?    Need for family participation in patient care:  Yes (Comment)  Care giving concerns:  Patient is a resident at [facility].    Social Worker assessment / plan: Holiday representative (CSW) received verbal consult from RN Case Manager that PT is recommending SNF. Patient is under Medicare observation and does not have a payer for SNF. CSW met with patient to discuss D/C plan. Patient was alert and oriented and was laying in the bed. Patient reported that she lives at [facility] independent living and has no children and no family in the area. Patient reported that she wants to be placed at a SNF or ALF. CSW explained that patient's Medicare will not pay for SNF because she is under observation and has not met the 3 night inpatient criteria. CSW discussed SNF and ALF private pay options. Per patient she cannot pay privately for SNF or ALF. Per patient she does pay for [facility] independent living, which requires all the money she has. Per patient she has applied for Medicaid and showed CSW  Medicaid application. CSW contacted Vance Gather Medicaid worker at [facility]. Per [place] patient has a pending Medicaid application for special assistance. Per [facility] requested patient's medial bills in March 2017 in order to process Medicaid application. CSW made patient aware of the conversation had with Vance Gather. Per patient she has not sent her medical bills to Medicaid.   Patient has no payer for SNF under Medicare and cannot pay privately. Patient has a pending Medicaid application. CSW explained that home health can be arranged back a [facility] and home health social worker will assist in placement once Medicaid is approved. RN Games developer is aware of above.   Employment status:  Disabled (Comment on whether or not currently receiving Disability) Insurance information:  Medicare, Self Pay (Medicaid Pending) (Medicare Observation ) PT Recommendations:  [place] (Patient is under Medicare observation ) Information / Referral to community resources:  Other (Comment Required) (RN Case Manager will arrange Home Health )  Patient/Family's Response to care:  Patient prefers to be placed at ALF or SNF however she does not have a payer.   Patient/Family's Understanding of and Emotional Response to Diagnosis, Current Treatment, and Prognosis:  Patient was pleasant during assessment and thanked CSW for visit.   Emotional Assessment Appearance:  Appears stated age Attitude/Demeanor/Rapport:    Affect (typically observed):  Accepting, Pleasant Orientation:  Oriented to Self, Oriented to Place, Oriented to  Time, Oriented to Situation Alcohol / Substance use:  Not Applicable Psych involvement (Current and /or in the community):  No (Comment)  Discharge Needs  Concerns to be addressed:  Discharge Planning Concerns Readmission within the last 30 days:  No Current discharge risk:  Dependent with Mobility Barriers to Discharge:  Continued Medical Work up   Loralyn Freshwater,  LCSW 03/14/2016, 2:50 PM

## 2016-03-14 NOTE — Telephone Encounter (Signed)
Attempted to contact pt regarding discharge from [facility] on 03/14/16. No answer, unable to leave message, as voice mail box is full.

## 2016-03-14 NOTE — Care Management (Signed)
Although patient is scared of going home alone due to her fall history she is agreeable to home with home health. [facility]erral to [facility] for SN, PT, OT, HHA, and SW. Explained to her that SW could help her with completing her medicaid application so she can be placed in ALF from [facility].

## 2016-03-14 NOTE — Progress Notes (Signed)
Patient again expressing concern that she had no help when she goes home today. RN reiterated that [facility] PT and CSW has been set up for patient when she goes home, that EMS has been set up to take her home and that all routes have been examined for care of patient. CM notified and will call into room and speak with patient and her POA who is now at bedside. Unit director also to round and assess.

## 2016-03-14 NOTE — Care Management (Signed)
Spoke with Timmothy Sours at [facility], he has no options for patient as [facility] is Independent Living. He states he is aware that patient has had falls and stays in her home most of the time but he does not get involved with her medical care. Patient states she has a walker, wheelchair and a BSC. She has been providing her own care up until this point independently but with intermittent falls. She is concerned she will continue to fall. Offered her a Advertising copywriter and he declined stating she could not afford a Actuary. She has no children and her family members at bedside are disabled and limited in their own mobility. Requested [facility] send a nurse out within 24 hours. Case discussed with CSW. Ambulance to transport patient home.

## 2016-03-14 NOTE — Progress Notes (Deleted)
Patient threw up small amount  of lght greenish  vomitus. Zofran 4 mg IV administered with relief. No acute distress noted. Will continue to monitor.

## 2016-03-14 NOTE — Telephone Encounter (Signed)
2nd attempt to contact pt.  No answer, unable to leave message, as voice mail box is full. It does not look like pt has been d/c'd from [facility] yet.

## 2016-03-14 NOTE — Care Management Obs Status (Signed)
[place] NOTIFICATION   Patient Details  Name: Michele Schultz MRN: [id number] Date of Birth: 1931-02-21   Medicare Observation Status Notification Given:  Yes    Jolly Mango, RN 03/14/2016, 8:21 AM

## 2016-03-15 NOTE — Telephone Encounter (Signed)
Patient contacted regarding discharge from [facility] on 03/14/16.  Patient understands to follow up with Lucillie Garfinkel on 03/24/16 at 2:30 at [facility]. Patient understands discharge instructions? yes Patient understands medications and regiment? yes Patient understands to bring all medications to this visit? yes  Pt reports that she is still feeling a bit weak since d/c, will try to catch up on her rest.

## 2016-03-20 ENCOUNTER — Ambulatory Visit (INDEPENDENT_AMBULATORY_CARE_PROVIDER_SITE_OTHER): Payer: Medicare Other | Admitting: *Deleted

## 2016-03-20 DIAGNOSIS — I442 Atrioventricular block, complete: Secondary | ICD-10-CM

## 2016-03-20 NOTE — Progress Notes (Signed)
Remote pacemaker transmission.   

## 2016-03-24 ENCOUNTER — Encounter: Payer: Self-pay | Admitting: Nurse Practitioner

## 2016-03-24 ENCOUNTER — Ambulatory Visit (INDEPENDENT_AMBULATORY_CARE_PROVIDER_SITE_OTHER): Payer: Medicare Other | Admitting: Nurse Practitioner

## 2016-03-24 VITALS — BP 120/80 | HR 115 | Ht 64.0 in | Wt 267.3 lb

## 2016-03-24 DIAGNOSIS — Z954 Presence of other heart-valve replacement: Secondary | ICD-10-CM

## 2016-03-24 DIAGNOSIS — I25119 Atherosclerotic heart disease of native coronary artery with unspecified angina pectoris: Secondary | ICD-10-CM

## 2016-03-24 DIAGNOSIS — I472 Ventricular tachycardia: Secondary | ICD-10-CM

## 2016-03-24 DIAGNOSIS — I119 Hypertensive heart disease without heart failure: Secondary | ICD-10-CM | POA: Insufficient documentation

## 2016-03-24 DIAGNOSIS — R0602 Shortness of breath: Secondary | ICD-10-CM

## 2016-03-24 DIAGNOSIS — I110 Hypertensive heart disease with heart failure: Secondary | ICD-10-CM

## 2016-03-24 DIAGNOSIS — I5032 Chronic diastolic (congestive) heart failure: Secondary | ICD-10-CM | POA: Diagnosis not present

## 2016-03-24 DIAGNOSIS — R079 Chest pain, unspecified: Secondary | ICD-10-CM | POA: Diagnosis not present

## 2016-03-24 DIAGNOSIS — R000 Tachycardia, unspecified: Secondary | ICD-10-CM

## 2016-03-24 DIAGNOSIS — Z952 Presence of prosthetic heart valve: Secondary | ICD-10-CM

## 2016-03-24 DIAGNOSIS — I471 Supraventricular tachycardia: Secondary | ICD-10-CM

## 2016-03-24 MED ORDER — AMIODARONE HCL 200 MG PO TABS: 200.0000 mg | ORAL_TABLET | Freq: Every day | ORAL | Status: DC

## 2016-03-24 MED ORDER — APIXABAN 5 MG PO TABS: 5.0000 mg | ORAL_TABLET | Freq: Two times a day (BID) | ORAL | Status: DC

## 2016-03-24 NOTE — Progress Notes (Signed)
Office Visit    Patient Name: Michele Schultz Date of Encounter: 03/24/2016  Primary Care Provider:  [name] Primary Cardiologist:  [name], MD / also followed in device clinic  Chief Complaint    82 y/o ? with a h/o HTN, HL, CAD, AS s/p bioprosthetic AVR, CHB s/p PPM, DM, obesity, and chronic pain, who presents today following recent hospitalization.  Past Medical History    Past Medical History  Diagnosis Date  . Hypertensive heart disease   . Anemia   . Neuralgia, neuritis, and radiculitis, unspecified   . Coronary artery disease     a. s/p 2v CABG 11/12 (VG-LAD, VG-OM1); b. Lexiscan 08/2015: low risk, no ischemia, EF 55-65%.  . Aortic stenosis, severe     a. s/p Magna Ease pericardial tissue valve size 21 mm replacement in 10/2011 for severe AS 11/12; b. echo 07/2015: EF 55-60% mod concentric LVH, GR1DD, LA mildly dilated, PASP 45 mm Hg  . Onychia and paronychia of toe   . Osteoarthrosis, unspecified whether generalized or localized, pelvic region and thigh     mainly in her back and knees  . Enthesopathy of hip region   . Esophageal reflux     followed by Dr.Seigal. stabilized with a combination of Nexium and Zantac  . Knee joint replacement by other means   . Stress incontinence, female     followed by Dr.Cope  . Complete heart block [facility])     a. s/p St Jude PPM 10/2011 (Ser # [id number]).  . Cardiac pacemaker -st Judes     11/12  . Chronic diastolic heart failure ([place])     a. echo 2014: EF 55-60%, no RWMA, GR1DD, PASP 47 mm Hg; b. echo 07/2015: EF 55-60% mod concentric LVH, GR1DD, LA mildly dilated, PASP 45 mm Hg  . Type II or unspecified type diabetes mellitus without mention of complication, not stated as uncontrolled     a. pt. reports that she is borderline   . Spinal stenosis   . Neuropathy ([place])   . Morbid obesity ([place])   . RAD (reactive airway disease)     a. chronic SOB  . Fibromyalgia   . HLD (hyperlipidemia)   . PAF (paroxysmal atrial  fibrillation) ([place])     a. brief episodes of AF previously noted on device interrogations.  . Wide-complex tachycardia ([place])     a. Noted 4/10 - brief episode in ED. Noted again 4/21 in clinic->felt most likely to be atrial tach.   Past Surgical History  Procedure Laterality Date  . Replacement total knee  11/2006    right knee  . Nasal sinus surgery  2009  . Total hip arthroplasty  09/2010  . Cardiac catheterization  08/2009    50% stenosis distal left main, 50% stenosis ostial left circumflex.   . Cardiac catheterization      at [facility]  . Eye surgery      IOL/ after cataracts removed- bilateral   . Aortic valve replacement  10/17/2011    Procedure: AORTIC VALVE REPLACEMENT (AVR);  Surgeon: Melrose Nakayama, MD;  Location: [facility];  Service: Open Heart Surgery;  Laterality: N/A;  . Coronary artery bypass graft  10/17/2011    Procedure: CORONARY ARTERY BYPASS GRAFTING (CABG);  Surgeon: Melrose Nakayama, MD;  Location: [place];  Service: Open Heart Surgery;  Laterality: N/A;  Times Two, using right leg greater saphenous vein harvested endoscopically  . Permanent pacemaker insertion N/A 10/20/2011    Procedure:  PERMANENT PACEMAKER INSERTION;  Surgeon: Thompson Grayer, MD;  Location: [facility] CATH LAB;  Service: Cardiovascular;  Laterality: N/A;   Allergies  Allergies  Allergen Reactions  . Citalopram Other (See Comments)    Reaction:  Altered mental status   . Cymbalta [Duloxetine Hcl] Other (See Comments)    Reaction:  Sedative for pt   . Imipramine Other (See Comments)    Reaction:  Unknown   . Proton Pump Inhibitors Other (See Comments)    Reaction:  Unknown   . Venlafaxine Nausea And Vomiting and Other (See Comments)    Reaction:  Dizziness     History of Present Illness    82 y/o ? with the above complex pmh.  She is s/p CABG and bioprosthetic AVR in 10/2011 and also has a h/o diast CHF, CHB s/p PPM, HTN, HL, obesity, DM, and chronic pain in the setting of fibromyalgia and RA.   She has also been noted to have brief runs of AFib on device monitoring over the years.  Episodes have been so brief that she was not felt to require anticoagulation in the past.  She also has a h/o somewhat chronic exertional c/p and dyspnea and had a negative MV in 08/2015.    She was recently admitted to [facility] on 4/10 secondary to general debility, chest pain, and wish to move to assisted living.  Her initial ECG showed a WCT @ a rate of 115 bpm.  This apparently converted spontaneously while in the ER.  The pt was asymptomatic.  She had no recurrence of WCT while hospitalized and r/o for MI.  She was evaluated by PT and felt to require SNF but was not a candidate for SNF or ALF based on her insurance and other criteria related to her admission status. She was thus d/c'd back to home [facility], independent living).  Since her d/c, she has been stable and at her baseline.  She is more or less bed/chair ridden 2/2 chronic pain.  She does have chronic DOE and intermittent exertional chest discomfort, which has been present for many months and is unchanged in frequency and severity, since her myoview in Sept.  She occasionally notes irregular palpitations, typically when lying bed at night. She isn't sure how long they might last.  Today, she is back in a WCT @ 115 bpm.  She is asymptomatic.    Home Medications    Prior to Admission medications   Medication Sig Start Date End Date Taking? Authorizing Provider  albuterol-ipratropium (COMBIVENT) 18-103 MCG/ACT inhaler Inhale 2 puffs into the lungs every 6 (six) hours as needed for wheezing. 06/19/13  Yes Minna Merritts, MD  amLODipine (NORVASC) 5 MG tablet Take 1 tablet (5 mg total) by mouth daily. 11/01/15  Yes Lauree Chandler, NP  aspirin EC 81 MG tablet Take 81 mg by mouth daily.   Yes Historical Provider, MD  azelastine (ASTELIN) 0.1 % nasal spray Place 1 spray into both nostrils as needed for rhinitis.    Yes Historical Provider, MD  benzonatate  (TESSALON) 100 MG capsule Take 200 mg by mouth 3 (three) times daily as needed for cough.    Yes Historical Provider, MD  cyanocobalamin (,VITAMIN B-12,) 1000 MCG/ML injection Inject 1,000 mcg into the muscle every 30 (thirty) days.   Yes Historical Provider, MD  cycloSPORINE (RESTASIS) 0.05 % ophthalmic emulsion Place 1 drop into both eyes 2 (two) times daily.    Yes Historical Provider, MD  furosemide (LASIX) 20 MG tablet  Take 1 tablet (20 mg total) by mouth every other day. 07/26/15  Yes Minna Merritts, MD  gabapentin (NEURONTIN) 300 MG capsule Take 1 capsule (300 mg total) by mouth 2 (two) times daily. 11/01/15  Yes Lauree Chandler, NP  HYDROcodone-acetaminophen (NORCO) 10-325 MG tablet Take 1 tablet by mouth every 4 (four) hours as needed for moderate pain.   Yes Historical Provider, MD  losartan (COZAAR) 25 MG tablet Take 25 mg by mouth daily.   Yes Historical Provider, MD  magnesium hydroxide (MILK OF MAGNESIA) 400 MG/5ML suspension Take 15 mLs by mouth daily as needed for mild constipation.    Yes Historical Provider, MD  metoprolol succinate (TOPROL-XL) 50 MG 24 hr tablet Take 1 tablet (50 mg total) by mouth daily. 11/01/15  Yes Lauree Chandler, NP  nitroGLYCERIN (NITROSTAT) 0.4 MG SL tablet Place 1 tablet (0.4 mg total) under the tongue every 5 (five) minutes as needed for chest pain. 01/24/13  Yes Minna Merritts, MD  pantoprazole (PROTONIX) 40 MG tablet Take 40 mg by mouth 2 (two) times daily.     Yes Historical Provider, MD  phenazopyridine (PYRIDIUM) 200 MG tablet Take 1 tablet (200 mg total) by mouth 3 (three) times daily as needed for pain. 07/10/15 07/09/16 Yes Earleen Newport, MD  potassium chloride (K-DUR) 10 MEQ tablet Take 10 mEq by mouth daily.   Yes Historical Provider, MD  promethazine (PHENERGAN) 25 MG tablet Take 1 tablet (25 mg total) by mouth every 6 (six) hours as needed for nausea or vomiting. 06/17/14  Yes Minna Merritts, MD    Review of Systems    Chronic  debility  chair/bed ridden @ baseline.  Chronic ex c/p and dyspnea.  Occas palpitations.  Occas dependent/LE edema.  She denies pnd, orthopnea, n, v, dizziness, syncope, or early satiety.  All other systems reviewed and are otherwise negative except as noted above.  Physical Exam    VS:  BP 120/80 mmHg  Pulse 115  Ht 5\' 4"  (1.626 m)  Wt 267 lb 5 oz (121.252 kg)  BMI 45.86 kg/m2 , BMI Body mass index is 45.86 kg/(m^2). GEN: Obese, in no acute distress. HEENT: normal. Neck: Supple, obese, no carotid bruits, or masses. Cardiac: RRR, tachy, no murmurs, rubs, or gallops. No clubbing, cyanosis, edema.  Radials/DP/PT 2+ and equal bilaterally.  Respiratory:  Respirations regular and unlabored, clear to auscultation bilaterally. GI: Obese, soft, nontender, nondistended, BS + x 4. MS: no deformity or atrophy. Skin: warm and dry, no rash. Neuro:  Strength and sensation are intact. Psych: Normal affect.  Accessory Clinical Findings    ECG - Wide complex tachycardia, likely atrial tachycardia, 115, LBBB, left axis - similar to admission ecg on 4/10.  Assessment & Plan    1.  Wide complex tachycardia/Atrial Tachycardia:  Pt presents today for hospital follow-up and is noted to be in a WCT @ 115 bpm.  She is asymptomatic and hemodynamically stable.  I reviewed her recent inpatient ECG's and note that she was in the same rhythm at the same rate on 4/10, while in the ED.  Per notes, this broke spontaneously and f/u ECG that day showed sinus rhythm.  D/C summary does not indicate if she had any further WCT during admission, but as our team was following her, presumably, she did not.  I saw that she had a recent remote transmission to our device clinic on 4/17 and reached out to them.  She has had mode switching with  suggestion of very brief episodes (<1 min) of atrial tachycardia based on rates, though she has also had atrial rates into the 180's (? Flutter).  I have also reviewed her ECG and case with  [place] and Dr. Lovena Le by phone.  We have asked her to go home and send in a remote transmission.  This will allow [place] to determine her atrial rate and decipher between atrial tachycardia and atrial flutter.  If flutter was noted, we could then plan to add anticoagulation and consider antiarrhythmic therapy.  In the meantime, she will remain on oral  blocker and asa.  This was discussed in length with the pt and she verbalizes understanding of directions re: remote transmission and plan going forward.  I will check a bmet, Mg, and TSH, and see her back in ~ 1 wk.  2.  Chronic Diastolic CHF:  She has chronic DOE though volume appears stable today.  HR up but BP stable.  Cont current regimen.  3.  CAD:  She has chronic exertional c/p.  She was recently admitted with nl troponins and also had a neg MV in 08/2015.  No further w/u @ this time.  Cont ASA,  blocker.  Unclear as to why she is not on a statin.  4.  Hypertensive Heart Disease:  Stable on  blocker, CCB, ARB, diuretic.  5.  H/O bioprosthetic AVR:  Stable by echo 07/2015.  6.  Disposition:  F/u labs today.  F/u remote transmission.  F/u in 1 wk.  Murray Hodgkins, NP 03/24/2016, 3:45 PM

## 2016-03-24 NOTE — Patient Instructions (Addendum)
Medication Instructions:  Your physician recommends that you continue on your current medications as directed. Please refer to the Current Medication list given to you today.  Labwork: BMET, TSH and magnesium today  Testing/Procedures: none  Follow-Up: Your physician recommends that you schedule a follow-up appointment in: one week with Christell Faith, PA-C or Ignacia Bayley, PA   Any Other Special Instructions Will Be Listed Below (If Applicable).     If you need a refill on your cardiac medications before your next appointment, please call your pharmacy.

## 2016-03-25 LAB — BASIC METABOLIC PANEL
BUN/Creatinine Ratio: 22 (ref 12–28)
BUN: 16 mg/dL (ref 8–27)
CALCIUM: 9.5 mg/dL (ref 8.7–10.3)
CO2: 25 mmol/L (ref 18–29)
CREATININE: 0.73 mg/dL (ref 0.57–1.00)
Chloride: 100 mmol/L (ref 96–106)
GFR calc Af Amer: 89 mL/min/{1.73_m2} (ref >59)
GFR, EST NON AFRICAN AMERICAN: 77 mL/min/{1.73_m2} (ref >59)
Glucose: 171 mg/dL — ABNORMAL HIGH (ref 65–99)
POTASSIUM: 4.4 mmol/L (ref 3.5–5.2)
Sodium: 141 mmol/L (ref 134–144)

## 2016-03-25 LAB — MAGNESIUM: MAGNESIUM: 2.2 mg/dL (ref 1.6–2.3)

## 2016-03-25 LAB — TSH: TSH: 0.817 u[IU]/mL (ref 0.450–4.500)

## 2016-04-04 ENCOUNTER — Encounter: Payer: Self-pay | Admitting: Internal Medicine

## 2016-04-06 ENCOUNTER — Encounter: Payer: Self-pay | Admitting: Nurse Practitioner

## 2016-04-06 ENCOUNTER — Ambulatory Visit (INDEPENDENT_AMBULATORY_CARE_PROVIDER_SITE_OTHER): Payer: Medicare Other | Admitting: Nurse Practitioner

## 2016-04-06 VITALS — BP 120/90 | HR 77 | Ht 64.0 in | Wt 271.1 lb

## 2016-04-06 DIAGNOSIS — I10 Essential (primary) hypertension: Secondary | ICD-10-CM

## 2016-04-06 DIAGNOSIS — I5032 Chronic diastolic (congestive) heart failure: Secondary | ICD-10-CM | POA: Diagnosis not present

## 2016-04-06 DIAGNOSIS — I110 Hypertensive heart disease with heart failure: Secondary | ICD-10-CM | POA: Diagnosis not present

## 2016-04-06 DIAGNOSIS — I471 Supraventricular tachycardia: Secondary | ICD-10-CM | POA: Diagnosis not present

## 2016-04-06 DIAGNOSIS — I25709 Atherosclerosis of coronary artery bypass graft(s), unspecified, with unspecified angina pectoris: Secondary | ICD-10-CM | POA: Diagnosis not present

## 2016-04-06 MED ORDER — FUROSEMIDE 20 MG PO TABS: 20.0000 mg | ORAL_TABLET | ORAL | Status: DC

## 2016-04-06 NOTE — Patient Instructions (Signed)
Medication Instructions:  Please continue current medications  Labwork: None  Testing/Procedures: None  Follow-Up: Call or return to clinic prn if these symptoms worsen or fail to improve as anticipated.  If you need a refill on your cardiac medications before your next appointment, please call your pharmacy.

## 2016-04-06 NOTE — Progress Notes (Signed)
Office Visit    Patient Name: Michele Schultz Date of Encounter: 04/06/2016  Primary Care Provider:  [name] Primary Cardiologist:  [name], MD   Chief Complaint    82 year old female with a history of hypertension, hyperlipidemia, coronary artery disease, aortic stenosis status post bioprosthetic aVR, complete heart block status post pacemaker, diabetes, obesity, and chronic pain who presents for follow-up.  Past Medical History    Past Medical History  Diagnosis Date  . Hypertensive heart disease   . Anemia   . Neuralgia, neuritis, and radiculitis, unspecified   . Coronary artery disease     a. s/p 2v CABG 11/12 (VG-LAD, VG-OM1); b. Lexiscan 08/2015: low risk, no ischemia, EF 55-65%.  . Aortic stenosis, severe     a. s/p Magna Ease pericardial tissue valve size 21 mm replacement in 10/2011 for severe AS 11/12; b. echo 07/2015: EF 55-60% mod concentric LVH, GR1DD, LA mildly dilated, PASP 45 mm Hg  . Onychia and paronychia of toe   . Osteoarthrosis, unspecified whether generalized or localized, pelvic region and thigh     mainly in her back and knees  . Enthesopathy of hip region   . Esophageal reflux     followed by Dr.Seigal. stabilized with a combination of Nexium and Zantac  . Knee joint replacement by other means   . Stress incontinence, female     followed by Dr.Cope  . Complete heart block [facility])     a. s/p St Jude PPM 10/2011 (Ser # [id number]).  . Cardiac pacemaker -st Judes     11/12  . Chronic diastolic heart failure ([place])     a. echo 2014: EF 55-60%, no RWMA, GR1DD, PASP 47 mm Hg; b. echo 07/2015: EF 55-60% mod concentric LVH, GR1DD, LA mildly dilated, PASP 45 mm Hg  . Type II or unspecified type diabetes mellitus without mention of complication, not stated as uncontrolled     a. pt. reports that she is borderline   . Spinal stenosis   . Neuropathy ([place])   . Morbid obesity ([place])   . RAD (reactive airway disease)     a. chronic SOB  .  Fibromyalgia   . HLD (hyperlipidemia)   . PAF (paroxysmal atrial fibrillation) ([place])     a. brief episodes of AF previously noted on device interrogations.  . Wide-complex tachycardia ([place])     a. Noted 4/10 - brief episode in ED. Noted again 4/21 in clinic->felt most likely to be atrial tach.   Past Surgical History  Procedure Laterality Date  . Replacement total knee  11/2006    right knee  . Nasal sinus surgery  2009  . Total hip arthroplasty  09/2010  . Cardiac catheterization  08/2009    50% stenosis distal left main, 50% stenosis ostial left circumflex.   . Cardiac catheterization      at [facility]  . Eye surgery      IOL/ after cataracts removed- bilateral   . Aortic valve replacement  10/17/2011    Procedure: AORTIC VALVE REPLACEMENT (AVR);  Surgeon: Melrose Nakayama, MD;  Location: [place];  Service: Open Heart Surgery;  Laterality: N/A;  . Coronary artery bypass graft  10/17/2011    Procedure: CORONARY ARTERY BYPASS GRAFTING (CABG);  Surgeon: Melrose Nakayama, MD;  Location: [place];  Service: Open Heart Surgery;  Laterality: N/A;  Times Two, using right leg greater saphenous vein harvested endoscopically  . Permanent pacemaker insertion N/A 10/20/2011    Procedure:  PERMANENT PACEMAKER INSERTION;  Surgeon: Thompson Grayer, MD;  Location: [facility] CATH LAB;  Service: Cardiovascular;  Laterality: N/A;    Allergies  Allergies  Allergen Reactions  . Citalopram Other (See Comments)    Reaction:  Altered mental status   . Cymbalta [Duloxetine Hcl] Other (See Comments)    Reaction:  Sedative for pt   . Imipramine Other (See Comments)    Reaction:  Unknown   . Proton Pump Inhibitors Other (See Comments)    Reaction:  Unknown   . Venlafaxine Nausea And Vomiting and Other (See Comments)    Reaction:  Dizziness     History of Present Illness    82 y/o ? with the above complex pmh. She is s/p CABG and bioprosthetic AVR in 10/2011 and also has a h/o diast CHF, CHB s/p PPM, HTN, HL,  obesity, DM, and chronic pain in the setting of fibromyalgia and RA. She has also been noted to have brief runs of AFib on device monitoring over the years. Episodes have been so brief that she was not felt to require anticoagulation in the past. She also has a h/o somewhat chronic exertional c/p and dyspnea and had a negative MV in 08/2015.   I last saw her in clinic on April 21 following hospitalization for general debility, chest pain, and wished to move to assisted living. During that visit, she was in an atrial tachycardia. This was reviewed with Dr. Lovena Le at [facility] and her remote device report was also discussed with the device clinic. Her beta blocker was adjusted and she returns today. Since her last visit, she has not had any palpitations or chest pain. She does have chronic dyspnea on exertion and her activities very limited. She stopped her Lasix for about 3 or 4 days because of polyuria which has since resolved. In that setting, her weight is up 4 pounds. She says her diet is horrible and she eats far too many calories. She is not careful about her caloric intake at all. She has gained a lot of weight over the past 3-4 years. She denies PND, orthopnea, dizziness, syncope, or early satiety. She does have some degree of lower extremity edema that is present in the afternoon and evening and gone by morning.   Home Medications    Prior to Admission medications   Medication Sig Start Date End Date Taking? Authorizing Provider  albuterol-ipratropium (COMBIVENT) 18-103 MCG/ACT inhaler Inhale 2 puffs into the lungs every 6 (six) hours as needed for wheezing. 06/19/13  Yes Minna Merritts, MD  amLODipine (NORVASC) 5 MG tablet Take 1 tablet (5 mg total) by mouth daily. 11/01/15  Yes Lauree Chandler, NP  aspirin EC 81 MG tablet Take 81 mg by mouth daily.   Yes Historical Provider, MD  azelastine (ASTELIN) 0.1 % nasal spray Place 1 spray into both nostrils as needed for rhinitis.    Yes  Historical Provider, MD  benzonatate (TESSALON) 100 MG capsule Take 200 mg by mouth 3 (three) times daily as needed for cough.    Yes Historical Provider, MD  cyanocobalamin (,VITAMIN B-12,) 1000 MCG/ML injection Inject 1,000 mcg into the muscle every 30 (thirty) days.   Yes Historical Provider, MD  cycloSPORINE (RESTASIS) 0.05 % ophthalmic emulsion Place 1 drop into both eyes 2 (two) times daily.    Yes Historical Provider, MD  furosemide (LASIX) 20 MG tablet Take 1 tablet (20 mg total) by mouth every other day. 04/06/16  Yes Rogelia Mire, NP  gabapentin (NEURONTIN) 300 MG capsule Take 1 capsule (300 mg total) by mouth 2 (two) times daily. 11/01/15  Yes Lauree Chandler, NP  HYDROcodone-acetaminophen (NORCO) 10-325 MG tablet Take 1 tablet by mouth every 4 (four) hours as needed for moderate pain.   Yes Historical Provider, MD  losartan (COZAAR) 25 MG tablet Take 25 mg by mouth daily.   Yes Historical Provider, MD  magnesium hydroxide (MILK OF MAGNESIA) 400 MG/5ML suspension Take 15 mLs by mouth daily as needed for mild constipation.    Yes Historical Provider, MD  metoprolol succinate (TOPROL-XL) 50 MG 24 hr tablet Take 1 tablet (50 mg total) by mouth daily. 11/01/15  Yes Lauree Chandler, NP  nitroGLYCERIN (NITROSTAT) 0.4 MG SL tablet Place 1 tablet (0.4 mg total) under the tongue every 5 (five) minutes as needed for chest pain. 01/24/13  Yes Minna Merritts, MD  pantoprazole (PROTONIX) 40 MG tablet Take 40 mg by mouth 2 (two) times daily.     Yes Historical Provider, MD  phenazopyridine (PYRIDIUM) 200 MG tablet Take 1 tablet (200 mg total) by mouth 3 (three) times daily as needed for pain. 07/10/15 07/09/16 Yes Earleen Newport, MD  potassium chloride (K-DUR) 10 MEQ tablet Take 10 mEq by mouth daily.   Yes Historical Provider, MD  promethazine (PHENERGAN) 25 MG tablet Take 1 tablet (25 mg total) by mouth every 6 (six) hours as needed for nausea or vomiting. 06/17/14  Yes Minna Merritts, MD     Review of Systems    As above, she has some degree of chronic debility with chronic dyspnea on exertion and lower extremity edema which is dependent in nature. She has not been having any chest pain, PND, orthopnea, dizziness, syncope, or early satiety.  All other systems reviewed and are otherwise negative except as noted above.  Physical Exam    VS:  BP 120/90 mmHg  Pulse 77  Ht 5\' 4"  (1.626 m)  Wt 271 lb 1.9 oz (122.979 kg)  BMI 46.51 kg/m2 , BMI Body mass index is 46.51 kg/(m^2). GEN: Obese, in no acute distress.  HEENT: normal.  Neck: Supple, obese, no carotid bruits, or masses. Cardiac: RRR, no murmurs, rubs, or gallops. No clubbing, cyanosis, trace lower extremity edema. Radials/DP/PT 2+ and equal bilaterally.  Respiratory: Respirations regular and unlabored, clear to auscultation bilaterally. GI: Obese, soft, nontender, nondistended, BS + x 4. MS: no deformity or atrophy. Skin: warm and dry, no rash. Neuro: Strength and sensation are intact. Psych: Normal affect.  Accessory Clinical Findings    ECG - Regular sinus rhythm, 77, no acute ST or T changes-baseline artifact.  Assessment & Plan    1.  Chronic diastolic congestive heart failure: Her weight is up today in the setting of being off of Lasix for a few days because of polyuria. She was not having any UTI symptoms. She will resume her Lasix at 20 mg every other day. I suspect the vast majority of her weight gain over time secondary to high caloric intake with very limited activity. We did discuss the importance of weight loss and caloric restriction as well as increasing activity. She says she is aware that her diet is horrible but she simply is not motivated to change it. Her.daughter is with her today and says she will help her to watch what she is eating with a goal of weight loss. Blood pressure and heart rate are stable today no changes to medications other than resuming Lasix.  2. Coronary  artery disease: She  is not currently having any chest pain. She recently had normal troponins on admission and negative Myoview in September 2016. Continue aspirin, beta blocker. She is not on a statin.  3. Hypertensive heart disease: Stable on beta blocker, calcium channel blocker, ARB, and diuretic.  4. Paroxysmal atrial tachycardia: She is in sinus rhythm today. She was asymptomatic while in tachycardia. She is follow-up with electrophysiology in June.  5. History of bioprosthetic aortic valve replacement: Stable by echocardiogram in August 2016.  6. Disposition: Follow-up with Dr. Caryl Comes as scheduled.    Murray Hodgkins, NP 04/06/2016, 4:45 PM

## 2016-04-15 ENCOUNTER — Emergency Department: Payer: Medicare Other

## 2016-04-15 ENCOUNTER — Encounter: Payer: Self-pay | Admitting: Emergency Medicine

## 2016-04-15 ENCOUNTER — Emergency Department
Admission: EM | Admit: 2016-04-15 | Discharge: 2016-04-15 | Disposition: A | Discharge status: Home / Self Care | Payer: Medicare Other | Attending: Emergency Medicine | Admitting: Emergency Medicine

## 2016-04-15 DIAGNOSIS — Z7982 Long term (current) use of aspirin: Secondary | ICD-10-CM | POA: Diagnosis not present

## 2016-04-15 DIAGNOSIS — M1990 Unspecified osteoarthritis, unspecified site: Secondary | ICD-10-CM | POA: Diagnosis not present

## 2016-04-15 DIAGNOSIS — I480 Paroxysmal atrial fibrillation: Secondary | ICD-10-CM | POA: Insufficient documentation

## 2016-04-15 DIAGNOSIS — R197 Diarrhea, unspecified: Secondary | ICD-10-CM | POA: Insufficient documentation

## 2016-04-15 DIAGNOSIS — Z952 Presence of prosthetic heart valve: Secondary | ICD-10-CM | POA: Insufficient documentation

## 2016-04-15 DIAGNOSIS — Z951 Presence of aortocoronary bypass graft: Secondary | ICD-10-CM | POA: Insufficient documentation

## 2016-04-15 DIAGNOSIS — R110 Nausea: Secondary | ICD-10-CM

## 2016-04-15 DIAGNOSIS — I2510 Atherosclerotic heart disease of native coronary artery without angina pectoris: Secondary | ICD-10-CM | POA: Diagnosis not present

## 2016-04-15 DIAGNOSIS — I110 Hypertensive heart disease with heart failure: Secondary | ICD-10-CM | POA: Diagnosis not present

## 2016-04-15 DIAGNOSIS — I5032 Chronic diastolic (congestive) heart failure: Secondary | ICD-10-CM | POA: Diagnosis not present

## 2016-04-15 DIAGNOSIS — Z79899 Other long term (current) drug therapy: Secondary | ICD-10-CM | POA: Insufficient documentation

## 2016-04-15 DIAGNOSIS — E785 Hyperlipidemia, unspecified: Secondary | ICD-10-CM | POA: Insufficient documentation

## 2016-04-15 DIAGNOSIS — R1013 Epigastric pain: Secondary | ICD-10-CM | POA: Insufficient documentation

## 2016-04-15 DIAGNOSIS — R1110 Vomiting, unspecified: Secondary | ICD-10-CM | POA: Diagnosis present

## 2016-04-15 DIAGNOSIS — E119 Type 2 diabetes mellitus without complications: Secondary | ICD-10-CM | POA: Diagnosis not present

## 2016-04-15 DIAGNOSIS — Z950 Presence of cardiac pacemaker: Secondary | ICD-10-CM | POA: Insufficient documentation

## 2016-04-15 DIAGNOSIS — Z8679 Personal history of other diseases of the circulatory system: Secondary | ICD-10-CM | POA: Diagnosis not present

## 2016-04-15 DIAGNOSIS — Z96653 Presence of artificial knee joint, bilateral: Secondary | ICD-10-CM | POA: Insufficient documentation

## 2016-04-15 DIAGNOSIS — R1084 Generalized abdominal pain: Secondary | ICD-10-CM

## 2016-04-15 LAB — CBC WITH DIFFERENTIAL/PLATELET
Basophils Absolute: 0 10*3/uL (ref 0–0.1)
EOS ABS: 0.1 10*3/uL (ref 0–0.7)
Eosinophils Relative: 1 %
HCT: 36 % (ref 35.0–47.0)
HEMOGLOBIN: 11.6 g/dL — AB (ref 12.0–16.0)
Lymphocytes Relative: 20 %
Lymphs Abs: 1.8 10*3/uL (ref 1.0–3.6)
MCH: 26.8 pg (ref 26.0–34.0)
MCHC: 32.4 g/dL (ref 32.0–36.0)
MCV: 82.8 fL (ref 80.0–100.0)
Monocytes Absolute: 0.6 10*3/uL (ref 0.2–0.9)
Neutro Abs: 6.7 10*3/uL — ABNORMAL HIGH (ref 1.4–6.5)
Platelets: 242 10*3/uL (ref 150–440)
RBC: 4.34 MIL/uL (ref 3.80–5.20)
RDW: 16.1 % — ABNORMAL HIGH (ref 11.5–14.5)
WBC: 9.2 10*3/uL (ref 3.6–11.0)

## 2016-04-15 LAB — COMPREHENSIVE METABOLIC PANEL
ALBUMIN: 3.8 g/dL (ref 3.5–5.0)
ALK PHOS: 103 U/L (ref 38–126)
ALT: 29 U/L (ref 14–54)
ANION GAP: 9 (ref 5–15)
AST: 32 U/L (ref 15–41)
BUN: 17 mg/dL (ref 6–20)
CALCIUM: 9 mg/dL (ref 8.9–10.3)
CHLORIDE: 104 mmol/L (ref 101–111)
CO2: 25 mmol/L (ref 22–32)
Creatinine, Ser: 0.82 mg/dL (ref 0.44–1.00)
GFR calc Af Amer: >60 mL/min (ref >60)
GFR calc non Af Amer: >60 mL/min (ref >60)
GLUCOSE: 212 mg/dL — AB (ref 65–99)
POTASSIUM: 3.8 mmol/L (ref 3.5–5.1)
SODIUM: 138 mmol/L (ref 135–145)
Total Bilirubin: 0.5 mg/dL (ref 0.3–1.2)
Total Protein: 7.5 g/dL (ref 6.5–8.1)

## 2016-04-15 LAB — URINALYSIS COMPLETE WITH MICROSCOPIC (ARMC ONLY)
Bilirubin Urine: NEGATIVE
Glucose, UA: NEGATIVE mg/dL
HGB URINE DIPSTICK: NEGATIVE
Ketones, ur: NEGATIVE mg/dL
Nitrite: NEGATIVE
PH: 8 (ref 5.0–8.0)
PROTEIN: NEGATIVE mg/dL
Specific Gravity, Urine: 1.015 (ref 1.005–1.030)

## 2016-04-15 LAB — LIPASE, BLOOD: LIPASE: 31 U/L (ref 11–51)

## 2016-04-15 LAB — TROPONIN I

## 2016-04-15 MED ORDER — HYDROMORPHONE HCL 1 MG/ML IJ SOLN
0.5000 mg | Freq: Once | INTRAMUSCULAR | Status: AC
  Administered 2016-04-15: 0.5 mg via INTRAVENOUS
  Filled 2016-04-15: qty 1

## 2016-04-15 MED ORDER — PROCHLORPERAZINE EDISYLATE 5 MG/ML IJ SOLN
10.0000 mg | Freq: Once | INTRAMUSCULAR | Status: AC
  Administered 2016-04-15: 10 mg via INTRAVENOUS
  Filled 2016-04-15: qty 2

## 2016-04-15 MED ORDER — LORAZEPAM 2 MG PO TABS
2.0000 mg | ORAL_TABLET | Freq: Once | ORAL | Status: AC
  Administered 2016-04-15: 2 mg via ORAL
  Filled 2016-04-15: qty 1

## 2016-04-15 NOTE — ED Notes (Signed)
Patient c/o N/V/D x2 weeks. Patient was seen here recently for the same. Today patient says she noticed bright red blood when she wipes. No hx of hemorroids. Patient is A&O x4.

## 2016-04-15 NOTE — Discharge Instructions (Signed)

## 2016-04-15 NOTE — ED Notes (Signed)
EMS here to transport patient back to [facility].

## 2016-04-15 NOTE — ED Notes (Signed)
Patient resting at this time with eyes closed. Even and non labored respirations noted. NSR on monitor.

## 2016-04-16 NOTE — ED Provider Notes (Signed)
Time Seen: Approximately 1950  I have reviewed the triage notes  Chief Complaint: Emesis and Diarrhea   History of Present Illness: Michele Schultz is a 82 y.o. female who has been seen and evaluated and treated and medically managed on an outpatient basis for some chronic abdominal pain and points primarily to the epigastric area with a history of nausea vomiting and loose stool. Recent onset was over the last 2 weeks. Patient states she noticed a small amount of blood in her stool and denies any rectal pain. Patient denies any feelings of lightheadedness or fever. Patient's a very poor historian and review of the records shows that she's been seen here before similar complaints along with her primary doctor. She states she has a lot of medication at home for nausea and vomiting and it sounds as though she declines to take it. Patient's currently at [facility]. Patient also complains of some right knee pain and states that she's been "" falling a lot "". This also apparently is been occurring for a significant period of time. She can't tell me exactly when her right knee started to hurt. Concerned because she's had previous surgery. She has been able to bear weight on her right lower extremity  Past Medical History  Diagnosis Date  . Hypertensive heart disease   . Anemia   . Neuralgia, neuritis, and radiculitis, unspecified   . Coronary artery disease     a. s/p 2v CABG 11/12 (VG-LAD, VG-OM1); b. Lexiscan 08/2015: low risk, no ischemia, EF 55-65%.  . Aortic stenosis, severe     a. s/p Magna Ease pericardial tissue valve size 21 mm replacement in 10/2011 for severe AS 11/12; b. echo 07/2015: EF 55-60% mod concentric LVH, GR1DD, LA mildly dilated, PASP 45 mm Hg  . Onychia and paronychia of toe   . Osteoarthrosis, unspecified whether generalized or localized, pelvic region and thigh     mainly in her back and knees  . Enthesopathy of hip region   . Esophageal reflux     followed by  Dr.Seigal. stabilized with a combination of Nexium and Zantac  . Knee joint replacement by other means   . Stress incontinence, female     followed by Dr.Cope  . Complete heart block [facility])     a. s/p St Jude PPM 10/2011 (Ser # [id number]).  . Cardiac pacemaker -st Judes     11/12  . Chronic diastolic heart failure ([place])     a. echo 2014: EF 55-60%, no RWMA, GR1DD, PASP 47 mm Hg; b. echo 07/2015: EF 55-60% mod concentric LVH, GR1DD, LA mildly dilated, PASP 45 mm Hg  . Type II or unspecified type diabetes mellitus without mention of complication, not stated as uncontrolled     a. pt. reports that she is borderline   . Spinal stenosis   . Neuropathy ([place])   . Morbid obesity ([place])   . RAD (reactive airway disease)     a. chronic SOB  . Fibromyalgia   . HLD (hyperlipidemia)   . PAF (paroxysmal atrial fibrillation) ([place])     a. brief episodes of AF previously noted on device interrogations.  . Wide-complex tachycardia ([place])     a. Noted 4/10 - brief episode in ED. Noted again 4/21 in clinic->felt most likely to be atrial tach.    Patient Active Problem List   Diagnosis Date Noted  . Hypertensive heart disease   . Wide-complex tachycardia ([place])   . Pain in the chest   .  Emesis   . Weakness of both legs   . Coronary artery disease involving coronary bypass graft of native heart with unspecified angina pectoris   . S/P AVR (aortic valve replacement)   . Falls frequently   . HLD (hyperlipidemia)   . Fibromyalgia   . RAD (reactive airway disease)   . Morbid obesity ([place])   . Aortic stenosis, severe   . Chronic pain 06/17/2014  . Pain in the muscles 06/19/2013  . Chest pain 06/19/2013  . Wheezing 06/19/2013  . Generalized weakness 06/03/2013  . Dyspnea 01/27/2013  . Nausea 03/05/2012  . Constipation 03/05/2012  . Atrial fibrillation-non sustained 02/09/2012  . Cardiac pacemaker -st Judes   . Chronic diastolic heart failure ([place])   . Coronary artery disease   . Long term (current)  use of anticoagulants 01/03/2012  . S/P CABG x 2 11/14/2011  . S/P aortic valve replacement with porcine valve 11/14/2011  . Back pain 11/14/2011  . Complete heart block ([place]) 10/20/2011  . HTN (hypertension) 09/15/2011  . Spinal stenosis 03/22/2011  . Fatigue 03/22/2011  . Hyperlipidemia 03/22/2011    Past Surgical History  Procedure Laterality Date  . Replacement total knee  11/2006    right knee  . Nasal sinus surgery  2009  . Total hip arthroplasty  09/2010  . Cardiac catheterization  08/2009    50% stenosis distal left main, 50% stenosis ostial left circumflex.   . Cardiac catheterization      at [facility]  . Eye surgery      IOL/ after cataracts removed- bilateral   . Aortic valve replacement  10/17/2011    Procedure: AORTIC VALVE REPLACEMENT (AVR);  Surgeon: Melrose Nakayama, MD;  Location: [place];  Service: Open Heart Surgery;  Laterality: N/A;  . Coronary artery bypass graft  10/17/2011    Procedure: CORONARY ARTERY BYPASS GRAFTING (CABG);  Surgeon: Melrose Nakayama, MD;  Location: [place];  Service: Open Heart Surgery;  Laterality: N/A;  Times Two, using right leg greater saphenous vein harvested endoscopically  . Permanent pacemaker insertion N/A 10/20/2011    Procedure: PERMANENT PACEMAKER INSERTION;  Surgeon: Thompson Grayer, MD;  Location: [facility] CATH LAB;  Service: Cardiovascular;  Laterality: N/A;    Past Surgical History  Procedure Laterality Date  . Replacement total knee  11/2006    right knee  . Nasal sinus surgery  2009  . Total hip arthroplasty  09/2010  . Cardiac catheterization  08/2009    50% stenosis distal left main, 50% stenosis ostial left circumflex.   . Cardiac catheterization      at [facility]  . Eye surgery      IOL/ after cataracts removed- bilateral   . Aortic valve replacement  10/17/2011    Procedure: AORTIC VALVE REPLACEMENT (AVR);  Surgeon: Melrose Nakayama, MD;  Location: [place];  Service: Open Heart Surgery;  Laterality: N/A;  . Coronary  artery bypass graft  10/17/2011    Procedure: CORONARY ARTERY BYPASS GRAFTING (CABG);  Surgeon: Melrose Nakayama, MD;  Location: [place];  Service: Open Heart Surgery;  Laterality: N/A;  Times Two, using right leg greater saphenous vein harvested endoscopically  . Permanent pacemaker insertion N/A 10/20/2011    Procedure: PERMANENT PACEMAKER INSERTION;  Surgeon: Thompson Grayer, MD;  Location: [facility] CATH LAB;  Service: Cardiovascular;  Laterality: N/A;    Current Outpatient Rx  Name  Route  Sig  Dispense  Refill  . albuterol-ipratropium (COMBIVENT) 18-103 MCG/ACT inhaler   Inhalation  Inhale 2 puffs into the lungs every 6 (six) hours as needed for wheezing.   1 Inhaler   4   . amLODipine (NORVASC) 5 MG tablet   Oral   Take 1 tablet (5 mg total) by mouth daily.   30 tablet   6   . aspirin EC 81 MG tablet   Oral   Take 81 mg by mouth daily.         Marland Kitchen azelastine (ASTELIN) 0.1 % nasal spray   Each Nare   Place 1 spray into both nostrils as needed for rhinitis.          Marland Kitchen benzonatate (TESSALON) 100 MG capsule   Oral   Take 200 mg by mouth 3 (three) times daily as needed for cough.          . cyanocobalamin (,VITAMIN B-12,) 1000 MCG/ML injection   Intramuscular   Inject 1,000 mcg into the muscle every 30 (thirty) days.         . cycloSPORINE (RESTASIS) 0.05 % ophthalmic emulsion   Both Eyes   Place 1 drop into both eyes 2 (two) times daily.          . furosemide (LASIX) 20 MG tablet   Oral   Take 1 tablet (20 mg total) by mouth every other day.   30 tablet   6   . gabapentin (NEURONTIN) 300 MG capsule   Oral   Take 1 capsule (300 mg total) by mouth 2 (two) times daily.   60 capsule   2   . HYDROcodone-acetaminophen (NORCO) 10-325 MG tablet   Oral   Take 1 tablet by mouth every 4 (four) hours as needed for moderate pain.         Marland Kitchen losartan (COZAAR) 25 MG tablet   Oral   Take 25 mg by mouth daily.         . magnesium hydroxide (MILK OF MAGNESIA) 400  MG/5ML suspension   Oral   Take 15 mLs by mouth daily as needed for mild constipation.          . metoprolol succinate (TOPROL-XL) 50 MG 24 hr tablet   Oral   Take 1 tablet (50 mg total) by mouth daily.   30 tablet   2   . nitroGLYCERIN (NITROSTAT) 0.4 MG SL tablet   Sublingual   Place 1 tablet (0.4 mg total) under the tongue every 5 (five) minutes as needed for chest pain.   90 tablet   3   . pantoprazole (PROTONIX) 40 MG tablet   Oral   Take 40 mg by mouth 2 (two) times daily.           . phenazopyridine (PYRIDIUM) 200 MG tablet   Oral   Take 1 tablet (200 mg total) by mouth 3 (three) times daily as needed for pain.   20 tablet   0   . potassium chloride (K-DUR) 10 MEQ tablet   Oral   Take 10 mEq by mouth daily.         . promethazine (PHENERGAN) 25 MG tablet   Oral   Take 1 tablet (25 mg total) by mouth every 6 (six) hours as needed for nausea or vomiting.   30 tablet   0     Allergies:  Citalopram; Cymbalta; Imipramine; Proton pump inhibitors; and Venlafaxine  Family History: Family History  Problem Relation Age of Onset  . Diabetes Other   . Heart disease Sister   . Heart disease Brother   .  Heart disease Brother   . Heart disease Brother   . Anesthesia problems Neg Hx   . Hypotension Neg Hx   . Malignant hyperthermia Neg Hx   . Pseudochol deficiency Neg Hx     Social History: Social History  Substance Use Topics  . Smoking status: Never Smoker   . Smokeless tobacco: Never Used  . Alcohol Use: No     Review of Systems:   10 point review of systems was performed and was otherwise negative:  Constitutional: No fever Eyes: No visual disturbances ENT: No sore throat, ear pain Cardiac: No chest pain Respiratory: No shortness of breath, wheezing, or stridor Abdomen: Abdominal pains primarily across the epigastric area Extremities: No peripheral edema, cyanosis Skin: No rashes, easy bruising Neurologic: No focal weakness, trouble with  speech or swollowing Urologic: No dysuria, Hematuria, or urinary frequency She denies any rectal pain. Only a very small amount of blood streaked on the tissue.  Physical Exam:  ED Triage Vitals  Enc Vitals Group     BP 04/15/16 1914 139/75 mmHg     Pulse Rate 04/15/16 2000 84     Resp 04/15/16 1914 20     Temp 04/15/16 1914 98.4 F (36.9 C)     Temp Source 04/15/16 1914 Oral     SpO2 04/15/16 1914 96 %     Weight 04/15/16 1914 240 lb (108.863 kg)     Height 04/15/16 1914 5\' 4"  (1.626 m)     Head Cir --      Peak Flow --      Pain Score 04/15/16 1915 4     Pain Loc --      Pain Edu? --      Excl. in [place]? --     General: Awake , Alert , and Oriented times 2; GCS 15. Anxious. Poor story and Head: Normal cephalic , atraumatic Eyes: Pupils equal , round, reactive to light Nose/Throat: No nasal drainage, patent upper airway without erythema or exudate.  Neck: Supple, Full range of motion, No anterior adenopathy or palpable thyroid masses Lungs: Clear to ascultation without wheezes , rhonchi, or rales Heart: Regular rate, regular rhythm without murmurs , gallops , or rubs Abdomen: Soft, tender to deep palpation without rebound, guarding , or rigidity; bowel sounds positive and symmetric in all 4 quadrants. No organomegaly .        Extremities: 2 plus symmetric pulses. No edema, clubbing or cyanosis Neurologic: normal ambulation, Motor symmetric without deficits, sensory intact Skin: warm, dry, no rashes Rectal exam with chaperone present shows no obvious blood in his guaiac-negative  Labs:   All laboratory work was reviewed including any pertinent negatives or positives listed below:  Labs Reviewed  CBC WITH DIFFERENTIAL/PLATELET - Abnormal; Notable for the following:    Hemoglobin 11.6 (*)    RDW 16.1 (*)    Neutro Abs 6.7 (*)    All other components within normal limits  COMPREHENSIVE METABOLIC PANEL - Abnormal; Notable for the following:    Glucose, Bld 212 (*)    All  other components within normal limits  URINALYSIS COMPLETEWITH MICROSCOPIC ([facility] ONLY) - Abnormal; Notable for the following:    Color, Urine YELLOW (*)    APPearance CLEAR (*)    Leukocytes, UA TRACE (*)    Bacteria, UA RARE (*)    Squamous Epithelial / LPF 0-5 (*)    All other components within normal limits  TROPONIN I  LIPASE, BLOOD  Laboratory work was reviewed and  showed no clinically significant abnormalities.   EKG:  ED ECG REPORT I, Daymon Larsen, the attending physician, personally viewed and interpreted this ECG.  Date: 04/16/2016 EKG Time: 1922 Rate: 83 Rhythm: normal sinus rhythm QRS Axis: normal Intervals: normal ST/T Wave abnormalities: normal Conduction Disturbances: none Narrative Interpretation: unremarkable Old inferior changes no acute ischemic pattern is noted  Radiology:   Final result by Rad Results In Interface (04/15/16 20:46:06)   Narrative:   CLINICAL DATA: Fall yesterday with knee and hip pain, initial encounter  EXAM: RIGHT KNEE - 1-2 VIEW  COMPARISON: None.  FINDINGS: Right knee replacement is noted. No acute fracture or dislocation is seen. No findings of loosening are noted. No joint effusion or other soft tissue abnormality is seen.  IMPRESSION: No acute abnormality noted.   Electronically Signed By: Inez Catalina M.D. On: 04/15/2016 20:46          CT RENAL STONE STUDY (Final result) Result time: 04/15/16 20:46:01   Final result by Rad Results In Interface (04/15/16 20:46:01)   Narrative:   CLINICAL DATA: N/V/D x2 weeks. Patient was seen here recently for the same. Today patient says she noticed bright red blood when she wipes. No hx of hemorrhoids, denies a/p surgery  EXAM: CT ABDOMEN AND PELVIS WITHOUT CONTRAST  TECHNIQUE: Multidetector CT imaging of the abdomen and pelvis was performed following the standard protocol without IV contrast.  COMPARISON: 12/06/2015  FINDINGS: Lower chest: Mild  scarring identified at the lung bases. There is coronary artery calcification. Transvenous pacemaker leads to the right ventricle.  Upper abdomen: Small intrarenal calculi are identified bilaterally. The largest is identified in the midpole region of left kidney, measuring 4 mm. No hydronephrosis or evidence for ureteral stones.  No focal abnormality identified within the liver, spleen, pancreas, or adrenal glands. Layering stones are identified within the gallbladder.  Gastrointestinal tract: Small hiatal hernia is present. Otherwise the stomach is normal in appearance. The small duodenal lipoma is present. Otherwise small bowel loops are normal in appearance. The cecum is again noted to be within the left upper quadrant otherwise colonic loops are normal in appearance. Normal appendix.  Pelvis: There is significant streak artifact from right hip hardware. The uterus is present. No adnexal mass.  Retroperitoneum: Atherosclerotic calcification of the abdominal aorta. No aneurysm.  Abdominal wall: Unremarkable.  Osseous structures: Right hip arthroplasty. Degenerative changes are seen in the spine. No suspicious lytic or blastic lesions are identified.  IMPRESSION: 1. Intrarenal calculi bilaterally without evidence for urinary tract obstruction. 2. Significant streak artifact in the lower pelvis, limiting evaluation of the soft tissues in this region. However, no pelvic mass identified. 3. Coronary artery disease. 4. Cholelithiasis. 5. Hiatal hernia. 6. Small duodenal lipoma, likely of little clinical significance. 7. Right hip arthroplasty.   Electronically Signed By: Nolon Nations M.D. On: 04/15/2016 20:46     I personally reviewed the radiologic studies     ED Course: * Patient presents with a history and recent numerous complaints. The patient's abdominal pain nausea vomiting and loose stools been appropriately evaluated on an outpatient basis. When  the patient was informed of her normal laboratory work CAT scan evaluation x-ray evaluation she was very resistant about returning to [facility]. She states that she wants to speak to a Education officer, museum to align for a different institution. I advised her that is currently Saturday evening and there is no Education officer, museum available at this time but I would place a consult for social work. Record show  that she was just recently evaluated in early April for an update and there was some insurance issues, etc. for her to change at this time. Patient was resting comfortably when I entered the room to discuss her results and then immediately started complaining of abdominal pain and I don't feel this is likely to be significant pathologic pain that we are not seeing at this time. With a negative CAT scan of the abdomen and normal laboratory work and felt this was unlikely to be mesenteric ischemic or other surgical issues at this time. Patient was align for BLS transport back to [facility] and I think she has some mild dementia and seems to have some memory deficits and seems to ask the same questions over and over again.    Assessment:  Acute exacerbation of abdominal pain with nausea, vomiting and diarrhea History of fall with right knee contusion   Final Clinical Impression:  Final diagnoses:  Diffuse abdominal pain  Nausea     Plan:  Outpatient management Patient was advised to return immediately if condition worsens. Patient was advised to follow up with their primary care physician or other specialized physicians involved in their outpatient care. The patient and/or family member/power of attorney had laboratory results reviewed at the bedside. All questions and concerns were addressed and appropriate discharge instructions were distributed by the nursing staff.             Daymon Larsen, MD 04/16/16 [phone]

## 2016-04-23 ENCOUNTER — Emergency Department
Admission: EM | Admit: 2016-04-23 | Discharge: 2016-04-23 | Payer: Medicare Other | Attending: Emergency Medicine | Admitting: Emergency Medicine

## 2016-04-23 ENCOUNTER — Emergency Department: Payer: Medicare Other

## 2016-04-23 ENCOUNTER — Encounter: Payer: Self-pay | Admitting: Emergency Medicine

## 2016-04-23 DIAGNOSIS — Z951 Presence of aortocoronary bypass graft: Secondary | ICD-10-CM | POA: Diagnosis not present

## 2016-04-23 DIAGNOSIS — R112 Nausea with vomiting, unspecified: Secondary | ICD-10-CM | POA: Diagnosis present

## 2016-04-23 DIAGNOSIS — W19XXXA Unspecified fall, initial encounter: Secondary | ICD-10-CM

## 2016-04-23 DIAGNOSIS — J45909 Unspecified asthma, uncomplicated: Secondary | ICD-10-CM | POA: Insufficient documentation

## 2016-04-23 DIAGNOSIS — M25552 Pain in left hip: Secondary | ICD-10-CM | POA: Diagnosis not present

## 2016-04-23 DIAGNOSIS — Z7982 Long term (current) use of aspirin: Secondary | ICD-10-CM | POA: Diagnosis not present

## 2016-04-23 DIAGNOSIS — E785 Hyperlipidemia, unspecified: Secondary | ICD-10-CM | POA: Diagnosis not present

## 2016-04-23 DIAGNOSIS — I110 Hypertensive heart disease with heart failure: Secondary | ICD-10-CM | POA: Diagnosis not present

## 2016-04-23 DIAGNOSIS — R195 Other fecal abnormalities: Secondary | ICD-10-CM

## 2016-04-23 DIAGNOSIS — I5032 Chronic diastolic (congestive) heart failure: Secondary | ICD-10-CM | POA: Insufficient documentation

## 2016-04-23 DIAGNOSIS — R1110 Vomiting, unspecified: Secondary | ICD-10-CM

## 2016-04-23 DIAGNOSIS — I480 Paroxysmal atrial fibrillation: Secondary | ICD-10-CM | POA: Diagnosis not present

## 2016-04-23 DIAGNOSIS — Z79899 Other long term (current) drug therapy: Secondary | ICD-10-CM | POA: Insufficient documentation

## 2016-04-23 DIAGNOSIS — R197 Diarrhea, unspecified: Secondary | ICD-10-CM | POA: Insufficient documentation

## 2016-04-23 DIAGNOSIS — E119 Type 2 diabetes mellitus without complications: Secondary | ICD-10-CM | POA: Insufficient documentation

## 2016-04-23 LAB — COMPREHENSIVE METABOLIC PANEL
ALK PHOS: 96 U/L (ref 38–126)
ALT: 26 U/L (ref 14–54)
AST: 29 U/L (ref 15–41)
Albumin: 4.2 g/dL (ref 3.5–5.0)
Anion gap: 9 (ref 5–15)
BUN: 13 mg/dL (ref 6–20)
CO2: 29 mmol/L (ref 22–32)
CREATININE: 0.67 mg/dL (ref 0.44–1.00)
Calcium: 9.4 mg/dL (ref 8.9–10.3)
Chloride: 100 mmol/L — ABNORMAL LOW (ref 101–111)
Glucose, Bld: 154 mg/dL — ABNORMAL HIGH (ref 65–99)
Potassium: 3.7 mmol/L (ref 3.5–5.1)
SODIUM: 138 mmol/L (ref 135–145)
TOTAL PROTEIN: 8.1 g/dL (ref 6.5–8.1)
Total Bilirubin: 0.7 mg/dL (ref 0.3–1.2)

## 2016-04-23 LAB — CBC
HEMATOCRIT: 38.7 % (ref 35.0–47.0)
Hemoglobin: 12.5 g/dL (ref 12.0–16.0)
MCH: 27.3 pg (ref 26.0–34.0)
MCHC: 32.4 g/dL (ref 32.0–36.0)
MCV: 84.5 fL (ref 80.0–100.0)
PLATELETS: 252 10*3/uL (ref 150–440)
RBC: 4.58 MIL/uL (ref 3.80–5.20)
RDW: 16.7 % — AB (ref 11.5–14.5)
WBC: 8.6 10*3/uL (ref 3.6–11.0)

## 2016-04-23 LAB — LIPASE, BLOOD: LIPASE: 26 U/L (ref 11–51)

## 2016-04-23 MED ORDER — OXYCODONE-ACETAMINOPHEN 5-325 MG PO TABS
2.0000 | ORAL_TABLET | Freq: Once | ORAL | Status: AC
  Administered 2016-04-23: 2 via ORAL
  Filled 2016-04-23: qty 2

## 2016-04-23 MED ORDER — PROMETHAZINE HCL 25 MG PO TABS
25.0000 mg | ORAL_TABLET | Freq: Once | ORAL | Status: AC
  Administered 2016-04-23: 25 mg via ORAL
  Filled 2016-04-23: qty 1

## 2016-04-23 MED ORDER — METOCLOPRAMIDE HCL 10 MG PO TABS: 10.0000 mg | ORAL_TABLET | Freq: Three times a day (TID) | ORAL | Status: DC | PRN

## 2016-04-23 MED ORDER — TRAMADOL HCL 50 MG PO TABS: 50.0000 mg | ORAL_TABLET | Freq: Four times a day (QID) | ORAL | Status: DC | PRN

## 2016-04-23 NOTE — ED Provider Notes (Signed)
[facility] Emergency Department Provider Note        Time seen: ----------------------------------------- 5:15 PM on 04/23/2016 -----------------------------------------    I have reviewed the triage vital signs and the nursing notes.   HISTORY  Chief Complaint Nausea; Emesis; and Diarrhea    HPI Michele Schultz is a 82 y.o. female who presents ER with nausea vomiting and diarrhea for the last 2 weeks. Patient also states she noted a small amount of blood in her stool yesterday. She called her doctor yesterday who advised to come to the ER if the bleeding persisted.Patient states she can't even keep down water, with any by mouth intake she has immediate vomiting. Patient states she also recently fell and is having right leg pain   Past Medical History  Diagnosis Date  . Hypertensive heart disease   . Anemia   . Neuralgia, neuritis, and radiculitis, unspecified   . Coronary artery disease     a. s/p 2v CABG 11/12 (VG-LAD, VG-OM1); b. Lexiscan 08/2015: low risk, no ischemia, EF 55-65%.  . Aortic stenosis, severe     a. s/p Magna Ease pericardial tissue valve size 21 mm replacement in 10/2011 for severe AS 11/12; b. echo 07/2015: EF 55-60% mod concentric LVH, GR1DD, LA mildly dilated, PASP 45 mm Hg  . Onychia and paronychia of toe   . Osteoarthrosis, unspecified whether generalized or localized, pelvic region and thigh     mainly in her back and knees  . Enthesopathy of hip region   . Esophageal reflux     followed by Dr.Seigal. stabilized with a combination of Nexium and Zantac  . Knee joint replacement by other means   . Stress incontinence, female     followed by Dr.Cope  . Complete heart block [facility])     a. s/p St Jude PPM 10/2011 (Ser # [id number]).  . Cardiac pacemaker -st Judes     11/12  . Chronic diastolic heart failure ([place])     a. echo 2014: EF 55-60%, no RWMA, GR1DD, PASP 47 mm Hg; b. echo 07/2015: EF 55-60% mod concentric LVH, GR1DD, LA  mildly dilated, PASP 45 mm Hg  . Type II or unspecified type diabetes mellitus without mention of complication, not stated as uncontrolled     a. pt. reports that she is borderline   . Spinal stenosis   . Neuropathy ([place])   . Morbid obesity ([place])   . RAD (reactive airway disease)     a. chronic SOB  . Fibromyalgia   . HLD (hyperlipidemia)   . PAF (paroxysmal atrial fibrillation) ([place])     a. brief episodes of AF previously noted on device interrogations.  . Wide-complex tachycardia ([place])     a. Noted 4/10 - brief episode in ED. Noted again 4/21 in clinic->felt most likely to be atrial tach.    Patient Active Problem List   Diagnosis Date Noted  . Hypertensive heart disease   . Wide-complex tachycardia ([place])   . Pain in the chest   . Emesis   . Weakness of both legs   . Coronary artery disease involving coronary bypass graft of native heart with unspecified angina pectoris   . S/P AVR (aortic valve replacement)   . Falls frequently   . HLD (hyperlipidemia)   . Fibromyalgia   . RAD (reactive airway disease)   . Morbid obesity ([place])   . Aortic stenosis, severe   . Chronic pain 06/17/2014  . Pain in the muscles 06/19/2013  .  Chest pain 06/19/2013  . Wheezing 06/19/2013  . Generalized weakness 06/03/2013  . Dyspnea 01/27/2013  . Nausea 03/05/2012  . Constipation 03/05/2012  . Atrial fibrillation-non sustained 02/09/2012  . Cardiac pacemaker -st Judes   . Chronic diastolic heart failure ([place])   . Coronary artery disease   . Long term (current) use of anticoagulants 01/03/2012  . S/P CABG x 2 11/14/2011  . S/P aortic valve replacement with porcine valve 11/14/2011  . Back pain 11/14/2011  . Complete heart block ([place]) 10/20/2011  . HTN (hypertension) 09/15/2011  . Spinal stenosis 03/22/2011  . Fatigue 03/22/2011  . Hyperlipidemia 03/22/2011    Past Surgical History  Procedure Laterality Date  . Replacement total knee  11/2006    right knee  . Nasal sinus surgery   2009  . Total hip arthroplasty  09/2010  . Cardiac catheterization  08/2009    50% stenosis distal left main, 50% stenosis ostial left circumflex.   . Cardiac catheterization      at [facility]  . Eye surgery      IOL/ after cataracts removed- bilateral   . Aortic valve replacement  10/17/2011    Procedure: AORTIC VALVE REPLACEMENT (AVR);  Surgeon: Melrose Nakayama, MD;  Location: [place];  Service: Open Heart Surgery;  Laterality: N/A;  . Coronary artery bypass graft  10/17/2011    Procedure: CORONARY ARTERY BYPASS GRAFTING (CABG);  Surgeon: Melrose Nakayama, MD;  Location: [place];  Service: Open Heart Surgery;  Laterality: N/A;  Times Two, using right leg greater saphenous vein harvested endoscopically  . Permanent pacemaker insertion N/A 10/20/2011    Procedure: PERMANENT PACEMAKER INSERTION;  Surgeon: Thompson Grayer, MD;  Location: [facility] CATH LAB;  Service: Cardiovascular;  Laterality: N/A;    Allergies Citalopram; Cymbalta; Imipramine; Proton pump inhibitors; and Venlafaxine  Social History Social History  Substance Use Topics  . Smoking status: Never Smoker   . Smokeless tobacco: Never Used  . Alcohol Use: No    Review of Systems Constitutional: Negative for fever. Eyes: Negative for visual changes. ENT: Negative for sore throat. Cardiovascular: Negative for chest pain. Respiratory: Negative for shortness of breath. Gastrointestinal: Negative for abdominal pain, Positive for vomiting and diarrhea or rectal bleeding Genitourinary: Negative for dysuria. Musculoskeletal: Positive for right leg pain Skin: Negative for rash. Neurological: Negative for headaches, focal weakness or numbness.  10-point ROS otherwise negative.  ____________________________________________   PHYSICAL EXAM:  VITAL SIGNS: ED Triage Vitals  Enc Vitals Group     BP 04/23/16 1504 155/85 mmHg     Pulse Rate 04/23/16 1504 85     Resp 04/23/16 1504 16     Temp 04/23/16 1504 98 F (36.7 C)     Temp  Source 04/23/16 1504 Oral     SpO2 04/23/16 1504 100 %     Weight 04/23/16 1504 241 lb (109.317 kg)     Height 04/23/16 1504 5\' 4"  (1.626 m)     Head Cir --      Peak Flow --      Pain Score 04/23/16 1506 0     Pain Loc --      Pain Edu? --      Excl. in [place]? --     Constitutional: Alert and oriented. Well appearing and in no distress. Eyes: Conjunctivae are normal. PERRL. Normal extraocular movements. ENT   Head: Normocephalic and atraumatic.   Nose: No congestion/rhinnorhea.   Mouth/Throat: Mucous membranes are moist.   Neck: No stridor. Cardiovascular: Normal  rate, regular rhythm. No murmurs, rubs, or gallops. Respiratory: Normal respiratory effort without tachypnea nor retractions. Breath sounds are clear and equal bilaterally. No wheezes/rales/rhonchi. Gastrointestinal: Soft and nontender. Normal bowel sounds Rectal: No hemorrhoids, trace heme positive stool. No gross blood. Musculoskeletal: Pain with range of motion of the right hip and thigh. Neurologic:  Normal speech and language. No gross focal neurologic deficits are appreciated.  Skin:  Skin is warm, dry and intact. No rash noted. Psychiatric: Mood and affect are normal. Speech and behavior are normal.   ____________________________________________  ED COURSE:  Pertinent labs & imaging results that were available during my care of the patient were reviewed by me and considered in my medical decision making (see chart for details). Patient presents to ER with nausea vomiting and diarrhea that's going up for 2 weeks. I will check basic labs, give oral anti-medics and reevaluate. We will also obtain imaging of the right leg. ____________________________________________    LABS (pertinent positives/negatives)  Labs Reviewed  COMPREHENSIVE METABOLIC PANEL - Abnormal; Notable for the following:    Chloride 100 (*)    Glucose, Bld 154 (*)    All other components within normal limits  CBC - Abnormal; Notable  for the following:    RDW 16.7 (*)    All other components within normal limits  LIPASE, BLOOD  URINALYSIS COMPLETEWITH MICROSCOPIC ([facility] ONLY)    RADIOLOGY Images were viewed by me  Right hip x-rays Are unremarkable. Hardware is intact ____________________________________________  FINAL ASSESSMENT AND PLAN  Fall, right hip pain, vomiting and diarrhea  Plan: Patient with labs and imaging as dictated above. Patient is in no acute distress, she'll be discharged with perhaps Reglan to help her symptoms she is stable for outpatient follow-up with her doctor. Her labs were unremarkable and stools were only trace heme positive.   Earleen Newport, MD   Note: This dictation was prepared with Dragon dictation. Any transcriptional errors that result from this process are unintentional   Earleen Newport, MD 04/23/16 Vernelle Emerald

## 2016-04-23 NOTE — ED Notes (Signed)
C/O N/V/D x 2 weeks.  States started seeing a "little bit of blood in stool" yesterday.  Called doctor yesterday, who advised patient to be seen in ED if bleeding continued.  Recently seen in ED for same complaint.

## 2016-04-23 NOTE — Discharge Instructions (Signed)
Norovirus Infection °A norovirus infection is caused by exposure to a virus in a group of similar viruses (noroviruses). This type of infection causes inflammation in your stomach and intestines (gastroenteritis). Norovirus is the most common cause of gastroenteritis. It also causes food poisoning. °Anyone can get a norovirus infection. It spreads very easily (contagious). You can get it from contaminated food, water, surfaces, or other people. Norovirus is found in the stool or vomit of infected people. You can spread the infection as soon as you feel sick until 2 weeks after you recover.  °Symptoms usually begin within 2 days after you become infected. Most norovirus symptoms affect the digestive system. °CAUSES °Norovirus infection is caused by contact with norovirus. You can catch norovirus if you: °· Eat or drink something contaminated with norovirus. °· Touch surfaces or objects contaminated with norovirus and then put your hand in your mouth. °· Have direct contact with an infected person who has symptoms. °· Share food, drink, or utensils with someone with who is sick with norovirus. °SIGNS AND SYMPTOMS °Symptoms of norovirus may include: °· Nausea. °· Vomiting. °· Diarrhea. °· Stomach cramps. °· Fever. °· Chills. °· Headache. °· Muscle aches. °· Tiredness. °DIAGNOSIS °Your health care provider may suspect norovirus based on your symptoms and physical exam. Your health care provider may also test a sample of your stool or vomit for the virus.  °TREATMENT °There is no specific treatment for norovirus. Most people get better without treatment in about 2 days. °HOME CARE INSTRUCTIONS °· Replace lost fluids by drinking plenty of water or rehydration fluids containing important minerals called electrolytes. This prevents dehydration. Drink enough fluid to keep your urine clear or pale yellow. °· Do not prepare food for others while you are infected. Wait at least 3 days after recovering from the illness to do  that. °PREVENTION  °· Wash your hands often, especially after using the toilet or changing a diaper. °· Wash fruits and vegetables thoroughly before preparing or serving them. °· Throw out any food that a sick person may have touched. °· Disinfect contaminated surfaces immediately after someone in the household has been sick. Use a bleach-based household cleaner. °· Immediately remove and wash soiled clothes or sheets. °SEEK MEDICAL CARE IF: °· Your vomiting, diarrhea, and stomach pain is getting worse. °· Your symptoms of norovirus do not go away after 2-3 days. °SEEK IMMEDIATE MEDICAL CARE IF:  °You develop symptoms of dehydration that do not improve with fluid replacement. This may include: °· Excessive sleepiness. °· Lack of tears. °· Dry mouth. °· Dizziness when standing. °· Weak pulse. °  °This information is not intended to replace advice given to you by your health care provider. Make sure you discuss any questions you have with your health care provider. °  °Document Released: 02/10/2003 Document Revised: 12/11/2014 Document Reviewed: 04/30/2014 °Elsevier Interactive Patient Education ©2016 Elsevier Inc. ° °

## 2016-04-23 NOTE — ED Notes (Signed)
MD at bedside. 

## 2016-04-23 NOTE — ED Notes (Signed)
Again called over by pt who again asked for apple juice, [place] sandwich and also a prescription to take home for pain medication; when going to get pt food and drink, EMS arrived to transport pt home; EMS says [employer] does not permit food or beverage on the truck; pt given water to drink before leaving as well as a prescription for pain medication;

## 2016-04-23 NOTE — ED Notes (Signed)
Pt alert and oriented X4, active, cooperative, pt in NAD. RR even and unlabored, color WNL.  Pt informed to return if any life threatening symptoms occur.   

## 2016-04-23 NOTE — ED Notes (Signed)
Pt states that she has absolutely no one that can come pick her up. Pt states that her husband died a few months ago. States she does not have children. Pt asking for EMS to pick her up. Explained to patient that she does not meet qualifications for EMS transport; states that she does not care if they bill her. Pt offered to call cab, states she has no money to pay for it. Charge RN notified and advised to move patient into hallway to await transport. Pt offered phone, states that her emergency contact is in the ICU sick. States granddaughter is out of town. Given apple juice and graham crackers.

## 2016-04-23 NOTE — ED Notes (Signed)
Called pharmacy for phenergan 

## 2016-04-23 NOTE — ED Notes (Signed)
Called over by pt as entering another room to draw blood; pt requesting apple juice and a [place] sandwich; pt understands as soon as possible I will get her something to eat and drink

## 2016-04-23 NOTE — ED Notes (Signed)
EMS has arrived to transport pt home

## 2016-04-27 ENCOUNTER — Encounter: Payer: Self-pay | Admitting: Cardiology

## 2016-04-27 LAB — CUP PACEART REMOTE DEVICE CHECK
Battery Remaining Longevity: 122 mo
Battery Remaining Percentage: 91 %
Battery Voltage: 2.95 V
Brady Statistic RA Percent Paced: 3.4 %
Brady Statistic RV Percent Paced: 1 %
Date Time Interrogation Session: 20170416070247
Implantable Lead Location: 753860
Lead Channel Impedance Value: 390 Ohm
Lead Channel Pacing Threshold Pulse Width: 0.4 ms
Lead Channel Sensing Intrinsic Amplitude: 1.7 mV
Lead Channel Setting Sensing Sensitivity: 4 mV
MDC IDC LEAD IMPLANT DT: 20121116
MDC IDC LEAD IMPLANT DT: 20121116
MDC IDC LEAD LOCATION: 753859
MDC IDC MSMT LEADCHNL RA PACING THRESHOLD AMPLITUDE: 0.625 V
MDC IDC MSMT LEADCHNL RV IMPEDANCE VALUE: 560 Ohm
MDC IDC MSMT LEADCHNL RV PACING THRESHOLD AMPLITUDE: 0.875 V
MDC IDC MSMT LEADCHNL RV PACING THRESHOLD PULSEWIDTH: 0.4 ms
MDC IDC MSMT LEADCHNL RV SENSING INTR AMPL: 12 mV
MDC IDC PG SERIAL: 7284703
MDC IDC SET LEADCHNL RA PACING AMPLITUDE: 1.625
MDC IDC SET LEADCHNL RV PACING AMPLITUDE: 1.125
MDC IDC SET LEADCHNL RV PACING PULSEWIDTH: 0.4 ms
MDC IDC STAT BRADY AP VP PERCENT: 1 %
MDC IDC STAT BRADY AP VS PERCENT: 3.6 %
MDC IDC STAT BRADY AS VP PERCENT: 1 %
MDC IDC STAT BRADY AS VS PERCENT: 96 %

## 2016-05-04 ENCOUNTER — Encounter: Payer: Medicare Other | Admitting: Internal Medicine

## 2016-05-30 ENCOUNTER — Encounter: Payer: Self-pay | Admitting: Internal Medicine

## 2016-05-30 ENCOUNTER — Ambulatory Visit (INDEPENDENT_AMBULATORY_CARE_PROVIDER_SITE_OTHER): Payer: Medicare Other | Admitting: Internal Medicine

## 2016-05-30 VITALS — BP 138/78 | HR 98 | Ht 64.0 in | Wt 275.0 lb

## 2016-05-30 DIAGNOSIS — Z950 Presence of cardiac pacemaker: Secondary | ICD-10-CM

## 2016-05-30 DIAGNOSIS — I25709 Atherosclerosis of coronary artery bypass graft(s), unspecified, with unspecified angina pectoris: Secondary | ICD-10-CM

## 2016-05-30 DIAGNOSIS — I442 Atrioventricular block, complete: Secondary | ICD-10-CM

## 2016-05-30 LAB — CUP PACEART INCLINIC DEVICE CHECK
Brady Statistic RA Percent Paced: 2.5 %
Brady Statistic RV Percent Paced: 0.53 %
Implantable Lead Implant Date: 20121116
Implantable Lead Location: 753860
Lead Channel Impedance Value: 575 Ohm
Lead Channel Pacing Threshold Amplitude: 0.625 V
Lead Channel Sensing Intrinsic Amplitude: 1.4 mV
Lead Channel Sensing Intrinsic Amplitude: 12 mV
Lead Channel Setting Pacing Pulse Width: 0.4 ms
MDC IDC LEAD IMPLANT DT: 20121116
MDC IDC LEAD LOCATION: 753859
MDC IDC MSMT BATTERY REMAINING LONGEVITY: 124.8
MDC IDC MSMT BATTERY VOLTAGE: 2.95 V
MDC IDC MSMT LEADCHNL RA IMPEDANCE VALUE: 387.5 Ohm
MDC IDC MSMT LEADCHNL RA PACING THRESHOLD PULSEWIDTH: 0.4 ms
MDC IDC MSMT LEADCHNL RV PACING THRESHOLD AMPLITUDE: 0.75 V
MDC IDC MSMT LEADCHNL RV PACING THRESHOLD PULSEWIDTH: 0.4 ms
MDC IDC SESS DTM: 20170627133519
MDC IDC SET LEADCHNL RA PACING AMPLITUDE: 1.625
MDC IDC SET LEADCHNL RV PACING AMPLITUDE: 1 V
MDC IDC SET LEADCHNL RV SENSING SENSITIVITY: 4 mV
Pulse Gen Serial Number: 7284703

## 2016-05-30 NOTE — Patient Instructions (Addendum)
Medication Instructions: - Your physician recommends that you continue on your current medications as directed. Please refer to the Current Medication list given to you today.  Labwork: - none  Procedures/Testing: - none  Follow-Up: - Your physician recommends that you schedule a follow-up appointment in: 4-6 weeks with Dr. Rockey Situ  - Remote monitoring is used to monitor your Pacemaker of ICD from home. This monitoring reduces the number of office visits required to check your device to one time per year. It allows [place] to keep an eye on the functioning of your device to ensure it is working properly. You are scheduled for a device check from home on 08/29/16. You may send your transmission at any time that day. If you have a wireless device, the transmission will be sent automatically. After your physician reviews your transmission, you will receive a postcard with your next transmission date.  - Your physician wants you to follow-up in: 1 year with Dr. Caryl Comes. You will receive a reminder letter in the mail two months in advance. If you don't receive a letter, please call our office to schedule the follow-up appointment.  Any Additional Special Instructions Will Be Listed Below (If Applicable).     If you need a refill on your cardiac medications before your next appointment, please call your pharmacy.

## 2016-05-30 NOTE — Progress Notes (Signed)
Patient Care Team: [name] as PCP - General (Internal Medicine) Minna Merritts, MD (Cardiology)   HPI  Michele Schultz is a 82 y.o. female Seen in follow-up to reestablish pacemaker follow-up. She was last seen January 2015.  She has a history of complete heart block following aortic valve replacement. She has a history of coronary artery disease and underwent CABG/AVR -bioprosethic   She has complaints of shortness of breath. She is largely long nonambulatory. She is a wheelchair. She has some peripheral edema.  Echocardiogram 8/16 demonstrated normal LV function and normal aortic valve. Diuretics were initiated. She was seen by CB-PA a couple times over the last months  Records and Results Reviewed out pt records  Past Medical History  Diagnosis Date  . Hypertensive heart disease   . Anemia   . Neuralgia, neuritis, and radiculitis, unspecified   . Coronary artery disease     a. s/p 2v CABG 11/12 (VG-LAD, VG-OM1); b. Lexiscan 08/2015: low risk, no ischemia, EF 55-65%.  . Aortic stenosis, severe     a. s/p Magna Ease pericardial tissue valve size 21 mm replacement in 10/2011 for severe AS 11/12; b. echo 07/2015: EF 55-60% mod concentric LVH, GR1DD, LA mildly dilated, PASP 45 mm Hg  . Onychia and paronychia of toe   . Osteoarthrosis, unspecified whether generalized or localized, pelvic region and thigh     mainly in her back and knees  . Enthesopathy of hip region   . Esophageal reflux     followed by Dr.Seigal. stabilized with a combination of Nexium and Zantac  . Knee joint replacement by other means   . Stress incontinence, female     followed by Dr.Cope  . Complete heart block [facility])     a. s/p St Jude PPM 10/2011 (Ser # [id number]).  . Cardiac pacemaker -st Judes     11/12  . Chronic diastolic heart failure ([place])     a. echo 2014: EF 55-60%, no RWMA, GR1DD, PASP 47 mm Hg; b. echo 07/2015: EF 55-60% mod concentric LVH, GR1DD, LA mildly dilated, PASP 45  mm Hg  . Type II or unspecified type diabetes mellitus without mention of complication, not stated as uncontrolled     a. pt. reports that she is borderline   . Spinal stenosis   . Neuropathy ([place])   . Morbid obesity ([place])   . RAD (reactive airway disease)     a. chronic SOB  . Fibromyalgia   . HLD (hyperlipidemia)   . PAF (paroxysmal atrial fibrillation) ([place])     a. brief episodes of AF previously noted on device interrogations.  . Wide-complex tachycardia ([place])     a. Noted 4/10 - brief episode in ED. Noted again 4/21 in clinic->felt most likely to be atrial tach.    Past Surgical History  Procedure Laterality Date  . Replacement total knee  11/2006    right knee  . Nasal sinus surgery  2009  . Total hip arthroplasty  09/2010  . Cardiac catheterization  08/2009    50% stenosis distal left main, 50% stenosis ostial left circumflex.   . Cardiac catheterization      at [facility]  . Eye surgery      IOL/ after cataracts removed- bilateral   . Aortic valve replacement  10/17/2011    Procedure: AORTIC VALVE REPLACEMENT (AVR);  Surgeon: Melrose Nakayama, MD;  Location: [place];  Service: Open Heart Surgery;  Laterality: N/A;  . Coronary  artery bypass graft  10/17/2011    Procedure: CORONARY ARTERY BYPASS GRAFTING (CABG);  Surgeon: Melrose Nakayama, MD;  Location: [place];  Service: Open Heart Surgery;  Laterality: N/A;  Times Two, using right leg greater saphenous vein harvested endoscopically  . Permanent pacemaker insertion N/A 10/20/2011    Procedure: PERMANENT PACEMAKER INSERTION;  Surgeon: Thompson Grayer, MD;  Location: [facility] CATH LAB;  Service: Cardiovascular;  Laterality: N/A;    Current Outpatient Prescriptions  Medication Sig Dispense Refill  . albuterol-ipratropium (COMBIVENT) 18-103 MCG/ACT inhaler Inhale 2 puffs into the lungs every 6 (six) hours as needed for wheezing. 1 Inhaler 4  . amLODipine (NORVASC) 5 MG tablet Take 1 tablet (5 mg total) by mouth daily. 30 tablet 6  .  aspirin EC 81 MG tablet Take 81 mg by mouth daily.    Marland Kitchen azelastine (ASTELIN) 0.1 % nasal spray Place 1 spray into both nostrils as needed for rhinitis.     Marland Kitchen benzonatate (TESSALON) 100 MG capsule Take 200 mg by mouth 3 (three) times daily as needed for cough.     . cyanocobalamin (,VITAMIN B-12,) 1000 MCG/ML injection Inject 1,000 mcg into the muscle every 30 (thirty) days.    . cycloSPORINE (RESTASIS) 0.05 % ophthalmic emulsion Place 1 drop into both eyes 2 (two) times daily.     . furosemide (LASIX) 20 MG tablet Take 1 tablet (20 mg total) by mouth every other day. 30 tablet 6  . gabapentin (NEURONTIN) 300 MG capsule Take 1 capsule (300 mg total) by mouth 2 (two) times daily. 60 capsule 2  . losartan (COZAAR) 25 MG tablet Take 25 mg by mouth daily.    . magnesium hydroxide (MILK OF MAGNESIA) 400 MG/5ML suspension Take 15 mLs by mouth daily as needed for mild constipation.     . metoCLOPramide (REGLAN) 10 MG tablet Take 1 tablet (10 mg total) by mouth every 8 (eight) hours as needed for nausea or vomiting. 30 tablet 1  . metoprolol succinate (TOPROL-XL) 50 MG 24 hr tablet Take 1 tablet (50 mg total) by mouth daily. 30 tablet 2  . nitroGLYCERIN (NITROSTAT) 0.4 MG SL tablet Place 1 tablet (0.4 mg total) under the tongue every 5 (five) minutes as needed for chest pain. 90 tablet 3  . pantoprazole (PROTONIX) 40 MG tablet Take 40 mg by mouth 2 (two) times daily.      . phenazopyridine (PYRIDIUM) 200 MG tablet Take 1 tablet (200 mg total) by mouth 3 (three) times daily as needed for pain. 20 tablet 0  . potassium chloride (K-DUR) 10 MEQ tablet Take 10 mEq by mouth daily.    . promethazine (PHENERGAN) 25 MG tablet Take 1 tablet (25 mg total) by mouth every 6 (six) hours as needed for nausea or vomiting. 30 tablet 0  . traMADol (ULTRAM) 50 MG tablet Take 1 tablet (50 mg total) by mouth every 6 (six) hours as needed. 20 tablet 0  . HYDROcodone-acetaminophen (NORCO) 10-325 MG tablet Take 1 tablet by mouth  every 4 (four) hours as needed for moderate pain. Reported on 05/30/2016     No current facility-administered medications for this visit.    Allergies  Allergen Reactions  . Citalopram Other (See Comments)    Reaction:  Altered mental status   . Cymbalta [Duloxetine Hcl] Other (See Comments)    Reaction:  Sedative for pt   . Imipramine Other (See Comments)    Reaction:  Unknown   . Proton Pump Inhibitors Other (See Comments)  Reaction:  Unknown   . Venlafaxine Nausea And Vomiting and Other (See Comments)    Reaction:  Dizziness       Review of Systems negative except from HPI and PMH  Physical Exam BP 138/78 mmHg  Pulse 98  Ht 5\' 4"  (1.626 m)  Wt 275 lb (124.739 kg)  BMI 47.18 kg/m2 Well developed and well nourished in no acute distress sitting in a wheelchair. HENT normal E scleral and icterus clear Neck Supple JVP flat; carotids brisk and full Clear to ausculation Device pocket well healed; without hematoma or erythema.  There is no tethering Regular rate and rhythm, no murmurs gallops or rub Soft with active bowel sounds No clubbing cyanosis 1+ Edema Alert and oriented, grossly normal motor and sensory function Skin Warm and Dry    Assessment and  Plan  Complete heart block-intermittent  Pacemaker-St. Jude  Aortic valve replacement  HFpEF   Device function is normal; normal intrinsic conduction. Electrocardiogram last month that demonstrated a narrow QRS. There is no atrial fibrillation. From an arrhythmia device point review there is little we can do to improve her symptoms. She's been reluctant to increase her diuretics because of incontinence. We discussed fluid management management with decreased by mouth intake.  We'll have her follow-up with general cardiology. We will see her in one year

## 2016-06-05 ENCOUNTER — Encounter: Payer: Self-pay | Admitting: Emergency Medicine

## 2016-06-05 ENCOUNTER — Emergency Department: Payer: Medicare Other

## 2016-06-05 ENCOUNTER — Emergency Department
Admission: EM | Admit: 2016-06-05 | Discharge: 2016-06-05 | Disposition: A | Discharge status: Home / Self Care | Payer: Medicare Other | Attending: Emergency Medicine | Admitting: Emergency Medicine

## 2016-06-05 DIAGNOSIS — Y999 Unspecified external cause status: Secondary | ICD-10-CM | POA: Diagnosis not present

## 2016-06-05 DIAGNOSIS — Z79899 Other long term (current) drug therapy: Secondary | ICD-10-CM | POA: Insufficient documentation

## 2016-06-05 DIAGNOSIS — W1839XA Other fall on same level, initial encounter: Secondary | ICD-10-CM | POA: Insufficient documentation

## 2016-06-05 DIAGNOSIS — Z952 Presence of prosthetic heart valve: Secondary | ICD-10-CM | POA: Insufficient documentation

## 2016-06-05 DIAGNOSIS — I5032 Chronic diastolic (congestive) heart failure: Secondary | ICD-10-CM | POA: Insufficient documentation

## 2016-06-05 DIAGNOSIS — I2510 Atherosclerotic heart disease of native coronary artery without angina pectoris: Secondary | ICD-10-CM | POA: Insufficient documentation

## 2016-06-05 DIAGNOSIS — Y92002 Bathroom of unspecified non-institutional (private) residence single-family (private) house as the place of occurrence of the external cause: Secondary | ICD-10-CM | POA: Insufficient documentation

## 2016-06-05 DIAGNOSIS — I110 Hypertensive heart disease with heart failure: Secondary | ICD-10-CM | POA: Diagnosis not present

## 2016-06-05 DIAGNOSIS — E119 Type 2 diabetes mellitus without complications: Secondary | ICD-10-CM | POA: Insufficient documentation

## 2016-06-05 DIAGNOSIS — Z950 Presence of cardiac pacemaker: Secondary | ICD-10-CM | POA: Diagnosis not present

## 2016-06-05 DIAGNOSIS — M479 Spondylosis, unspecified: Secondary | ICD-10-CM | POA: Diagnosis not present

## 2016-06-05 DIAGNOSIS — Z7982 Long term (current) use of aspirin: Secondary | ICD-10-CM | POA: Insufficient documentation

## 2016-06-05 DIAGNOSIS — Y939 Activity, unspecified: Secondary | ICD-10-CM | POA: Diagnosis not present

## 2016-06-05 DIAGNOSIS — W19XXXA Unspecified fall, initial encounter: Secondary | ICD-10-CM

## 2016-06-05 DIAGNOSIS — I4891 Unspecified atrial fibrillation: Secondary | ICD-10-CM | POA: Insufficient documentation

## 2016-06-05 DIAGNOSIS — E785 Hyperlipidemia, unspecified: Secondary | ICD-10-CM | POA: Insufficient documentation

## 2016-06-05 DIAGNOSIS — Z951 Presence of aortocoronary bypass graft: Secondary | ICD-10-CM | POA: Diagnosis not present

## 2016-06-05 DIAGNOSIS — G44309 Post-traumatic headache, unspecified, not intractable: Secondary | ICD-10-CM | POA: Diagnosis not present

## 2016-06-05 DIAGNOSIS — R51 Headache: Secondary | ICD-10-CM | POA: Diagnosis present

## 2016-06-05 LAB — CBC WITH DIFFERENTIAL/PLATELET
Basophils Absolute: 0 10*3/uL (ref 0–0.1)
Basophils Relative: 1 %
EOS ABS: 0.1 10*3/uL (ref 0–0.7)
Eosinophils Relative: 1 %
HCT: 36.1 % (ref 35.0–47.0)
HEMOGLOBIN: 11.9 g/dL — AB (ref 12.0–16.0)
LYMPHS ABS: 2.1 10*3/uL (ref 1.0–3.6)
Lymphocytes Relative: 27 %
MCH: 27.4 pg (ref 26.0–34.0)
MCHC: 32.9 g/dL (ref 32.0–36.0)
MCV: 83.2 fL (ref 80.0–100.0)
MONOS PCT: 8 %
Monocytes Absolute: 0.6 10*3/uL (ref 0.2–0.9)
NEUTROS PCT: 63 %
Neutro Abs: 5 10*3/uL (ref 1.4–6.5)
Platelets: 220 10*3/uL (ref 150–440)
RBC: 4.34 MIL/uL (ref 3.80–5.20)
RDW: 16.6 % — ABNORMAL HIGH (ref 11.5–14.5)
WBC: 7.8 10*3/uL (ref 3.6–11.0)

## 2016-06-05 LAB — BASIC METABOLIC PANEL
Anion gap: 8 (ref 5–15)
BUN: 16 mg/dL (ref 6–20)
CHLORIDE: 104 mmol/L (ref 101–111)
CO2: 29 mmol/L (ref 22–32)
CREATININE: 0.83 mg/dL (ref 0.44–1.00)
Calcium: 9.2 mg/dL (ref 8.9–10.3)
GFR calc Af Amer: >60 mL/min (ref >60)
GFR calc non Af Amer: >60 mL/min (ref >60)
Glucose, Bld: 209 mg/dL — ABNORMAL HIGH (ref 65–99)
Potassium: 2.8 mmol/L — ABNORMAL LOW (ref 3.5–5.1)
Sodium: 141 mmol/L (ref 135–145)

## 2016-06-05 LAB — TROPONIN I

## 2016-06-05 MED ORDER — IBUPROFEN 800 MG PO TABS
800.0000 mg | ORAL_TABLET | Freq: Once | ORAL | Status: AC
  Administered 2016-06-05: 800 mg via ORAL

## 2016-06-05 MED ORDER — MORPHINE SULFATE (PF) 2 MG/ML IV SOLN
2.0000 mg | Freq: Once | INTRAVENOUS | Status: AC
  Administered 2016-06-05: 2 mg via INTRAMUSCULAR
  Filled 2016-06-05: qty 1

## 2016-06-05 MED ORDER — ACETAMINOPHEN 500 MG PO TABS
1000.0000 mg | ORAL_TABLET | Freq: Four times a day (QID) | ORAL | Status: DC | PRN
  Administered 2016-06-05: 1000 mg via ORAL

## 2016-06-05 NOTE — ED Notes (Signed)
Pt states "i want something for my head." order for motrin secured from dr. Cinda Quest. Pt assisted to position change in bed for comfort. Additional warm blankets provided for comfort.

## 2016-06-05 NOTE — ED Notes (Signed)
Patient transported to X-ray 

## 2016-06-05 NOTE — ED Notes (Signed)
Report to amy, rn

## 2016-06-05 NOTE — ED Notes (Signed)
Discharge instructions and follow up instructions discussed with pt who verbalizes understanding. Awaiting ems transfer. Pt requesting ems transport home. Discussed with pt she may be responsible for bill. Pt verbalizes understanding.

## 2016-06-05 NOTE — ED Notes (Signed)
Pt updated on ems transport time. Pt verbalizes understanding.

## 2016-06-05 NOTE — ED Notes (Addendum)
Pt assisted with position change in bed again for comfort. Water provided with md consent.

## 2016-06-05 NOTE — ED Notes (Signed)
Pt. Here via EMS from home for fall.  Pt. States she got dizzy after using bathroom and fell.  Pt. Denies LOC.  Pt. States pain to rt. Shoulder and rt. Side of head.

## 2016-06-05 NOTE — ED Notes (Signed)
Pt reports continued headache. Order for tylenol received from dr. Cinda Quest.

## 2016-06-05 NOTE — ED Notes (Signed)
Pt assisted with bedpan and repositioned in bed for comfort. Pt updated on ems transport time.

## 2016-06-05 NOTE — ED Notes (Signed)
Pt CT scan has now been cancelled and she will be transporting at this time   Zofran script provided for nausea  Discharge instructions reviewed with her by previous RN  April

## 2016-06-05 NOTE — ED Notes (Signed)
Pt assisted with bedpan, no urine collected, urine saturated sheets, none in pan. Sheets cleansed.

## 2016-06-05 NOTE — ED Notes (Signed)
Immediately after giving pt motrin she states "my head still hurts, it doesn't feel any better." explanation of po medicine process discussed with pt. Discussed with pt wait time for po medication to take effect. Pt verbalizes understanding. Call bell at side.

## 2016-06-05 NOTE — ED Provider Notes (Addendum)
[facility] Emergency Department Provider Note   ____________________________________________  Time seen: Approximately 2:06 AM  I have reviewed the triage vital signs and the nursing notes.   HISTORY  Chief Complaint Fall    HPI Michele Schultz is a 82 y.o. female who reports she was coming on the bathroom lost her balance and fell. She fell on her right side of the right side of her head and right shoulder. She has pain in the right shoulder and the right side of her head. She was somewhat dizzy after she fell. Dizziness is now gone but she still has pain in the right shoulder right side of her head. She does not think she passed out. She reports right side of her body has been weak for several years she's not sure why. The weakness is unchanged has not gotten worse. She reports she has trouble walking long distances because of the weakness.   Past Medical History  Diagnosis Date  . Hypertensive heart disease   . Anemia   . Neuralgia, neuritis, and radiculitis, unspecified   . Coronary artery disease     a. s/p 2v CABG 11/12 (VG-LAD, VG-OM1); b. Lexiscan 08/2015: low risk, no ischemia, EF 55-65%.  . Aortic stenosis, severe     a. s/p Magna Ease pericardial tissue valve size 21 mm replacement in 10/2011 for severe AS 11/12; b. echo 07/2015: EF 55-60% mod concentric LVH, GR1DD, LA mildly dilated, PASP 45 mm Hg  . Onychia and paronychia of toe   . Osteoarthrosis, unspecified whether generalized or localized, pelvic region and thigh     mainly in her back and knees  . Enthesopathy of hip region   . Esophageal reflux     followed by Dr.Seigal. stabilized with a combination of Nexium and Zantac  . Knee joint replacement by other means   . Stress incontinence, female     followed by Dr.Cope  . Complete heart block [facility])     a. s/p St Jude PPM 10/2011 (Ser # [id number]).  . Cardiac pacemaker -st Judes     11/12  . Chronic diastolic heart failure ([place])    a. echo 2014: EF 55-60%, no RWMA, GR1DD, PASP 47 mm Hg; b. echo 07/2015: EF 55-60% mod concentric LVH, GR1DD, LA mildly dilated, PASP 45 mm Hg  . Type II or unspecified type diabetes mellitus without mention of complication, not stated as uncontrolled     a. pt. reports that she is borderline   . Spinal stenosis   . Neuropathy ([place])   . Morbid obesity ([place])   . RAD (reactive airway disease)     a. chronic SOB  . Fibromyalgia   . HLD (hyperlipidemia)   . PAF (paroxysmal atrial fibrillation) ([place])     a. brief episodes of AF previously noted on device interrogations.  . Wide-complex tachycardia ([place])     a. Noted 4/10 - brief episode in ED. Noted again 4/21 in clinic->felt most likely to be atrial tach.    Patient Active Problem List   Diagnosis Date Noted  . Hypertensive heart disease   . Wide-complex tachycardia ([place])   . Pain in the chest   . Emesis   . Weakness of both legs   . Coronary artery disease involving coronary bypass graft of native heart with unspecified angina pectoris   . S/P AVR (aortic valve replacement)   . Falls frequently   . HLD (hyperlipidemia)   . Fibromyalgia   . RAD (reactive  airway disease)   . Morbid obesity ([place])   . Aortic stenosis, severe   . Chronic pain 06/17/2014  . Pain in the muscles 06/19/2013  . Chest pain 06/19/2013  . Wheezing 06/19/2013  . Generalized weakness 06/03/2013  . Dyspnea 01/27/2013  . Nausea 03/05/2012  . Constipation 03/05/2012  . Atrial fibrillation-non sustained 02/09/2012  . Cardiac pacemaker -st Judes   . Chronic diastolic heart failure ([place])   . Coronary artery disease   . Long term (current) use of anticoagulants 01/03/2012  . S/P CABG x 2 11/14/2011  . S/P aortic valve replacement with porcine valve 11/14/2011  . Back pain 11/14/2011  . Complete heart block ([place]) 10/20/2011  . HTN (hypertension) 09/15/2011  . Spinal stenosis 03/22/2011  . Fatigue 03/22/2011  . Hyperlipidemia 03/22/2011    Past Surgical  History  Procedure Laterality Date  . Replacement total knee  11/2006    right knee  . Nasal sinus surgery  2009  . Total hip arthroplasty  09/2010  . Cardiac catheterization  08/2009    50% stenosis distal left main, 50% stenosis ostial left circumflex.   . Cardiac catheterization      at [facility]  . Eye surgery      IOL/ after cataracts removed- bilateral   . Aortic valve replacement  10/17/2011    Procedure: AORTIC VALVE REPLACEMENT (AVR);  Surgeon: Melrose Nakayama, MD;  Location: [place];  Service: Open Heart Surgery;  Laterality: N/A;  . Coronary artery bypass graft  10/17/2011    Procedure: CORONARY ARTERY BYPASS GRAFTING (CABG);  Surgeon: Melrose Nakayama, MD;  Location: [place];  Service: Open Heart Surgery;  Laterality: N/A;  Times Two, using right leg greater saphenous vein harvested endoscopically  . Permanent pacemaker insertion N/A 10/20/2011    Procedure: PERMANENT PACEMAKER INSERTION;  Surgeon: Thompson Grayer, MD;  Location: [facility] CATH LAB;  Service: Cardiovascular;  Laterality: N/A;    Current Outpatient Rx  Name  Route  Sig  Dispense  Refill  . albuterol-ipratropium (COMBIVENT) 18-103 MCG/ACT inhaler   Inhalation   Inhale 2 puffs into the lungs every 6 (six) hours as needed for wheezing.   1 Inhaler   4   . amLODipine (NORVASC) 5 MG tablet   Oral   Take 1 tablet (5 mg total) by mouth daily.   30 tablet   6   . aspirin EC 81 MG tablet   Oral   Take 81 mg by mouth daily.         Marland Kitchen azelastine (ASTELIN) 0.1 % nasal spray   Each Nare   Place 1 spray into both nostrils as needed for rhinitis.          Marland Kitchen benzonatate (TESSALON) 100 MG capsule   Oral   Take 200 mg by mouth 3 (three) times daily as needed for cough.          . cyanocobalamin (,VITAMIN B-12,) 1000 MCG/ML injection   Intramuscular   Inject 1,000 mcg into the muscle every 30 (thirty) days.         . cycloSPORINE (RESTASIS) 0.05 % ophthalmic emulsion   Both Eyes   Place 1 drop into both eyes  2 (two) times daily.          . furosemide (LASIX) 20 MG tablet   Oral   Take 1 tablet (20 mg total) by mouth every other day.   30 tablet   6   . gabapentin (NEURONTIN) 300 MG capsule  Oral   Take 1 capsule (300 mg total) by mouth 2 (two) times daily.   60 capsule   2   . HYDROcodone-acetaminophen (NORCO) 10-325 MG tablet   Oral   Take 1 tablet by mouth every 4 (four) hours as needed for moderate pain. Reported on 05/30/2016         . losartan (COZAAR) 25 MG tablet   Oral   Take 25 mg by mouth daily.         . magnesium hydroxide (MILK OF MAGNESIA) 400 MG/5ML suspension   Oral   Take 15 mLs by mouth daily as needed for mild constipation.          . metoCLOPramide (REGLAN) 10 MG tablet   Oral   Take 1 tablet (10 mg total) by mouth every 8 (eight) hours as needed for nausea or vomiting.   30 tablet   1   . metoprolol succinate (TOPROL-XL) 50 MG 24 hr tablet   Oral   Take 1 tablet (50 mg total) by mouth daily.   30 tablet   2   . nitroGLYCERIN (NITROSTAT) 0.4 MG SL tablet   Sublingual   Place 1 tablet (0.4 mg total) under the tongue every 5 (five) minutes as needed for chest pain.   90 tablet   3   . pantoprazole (PROTONIX) 40 MG tablet   Oral   Take 40 mg by mouth 2 (two) times daily.           . phenazopyridine (PYRIDIUM) 200 MG tablet   Oral   Take 1 tablet (200 mg total) by mouth 3 (three) times daily as needed for pain.   20 tablet   0   . potassium chloride (K-DUR) 10 MEQ tablet   Oral   Take 10 mEq by mouth daily.         . promethazine (PHENERGAN) 25 MG tablet   Oral   Take 1 tablet (25 mg total) by mouth every 6 (six) hours as needed for nausea or vomiting.   30 tablet   0   . traMADol (ULTRAM) 50 MG tablet   Oral   Take 1 tablet (50 mg total) by mouth every 6 (six) hours as needed.   20 tablet   0     Allergies Citalopram; Cymbalta; Imipramine; Proton pump inhibitors; and Venlafaxine  Family History  Problem Relation Age  of Onset  . Diabetes Other   . Heart disease Sister   . Heart disease Brother   . Heart disease Brother   . Heart disease Brother   . Anesthesia problems Neg Hx   . Hypotension Neg Hx   . Malignant hyperthermia Neg Hx   . Pseudochol deficiency Neg Hx     Social History Social History  Substance Use Topics  . Smoking status: Never Smoker   . Smokeless tobacco: Never Used  . Alcohol Use: No    Review of Systems Constitutional: No fever/chills Eyes: No visual changes. ENT: No sore throat. Cardiovascular: Denies chest pain. Respiratory: Denies shortness of breath. Gastrointestinal: No abdominal pain.  No nausea, no vomiting.  No diarrhea.  No constipation. Genitourinary: Negative for dysuria. Musculoskeletal: Negative for back pain. Skin: Negative for rash. Neurological: Negative for headaches, focal weakness or numbness.  10-point ROS otherwise negative.  ____________________________________________   PHYSICAL EXAM:  VITAL SIGNS: ED Triage Vitals  Enc Vitals Group     BP 06/05/16 0103 148/77 mmHg     Pulse Rate 06/05/16 0103 88  Resp 06/05/16 0103 15     Temp 06/05/16 0103 98.5 F (36.9 C)     Temp Source 06/05/16 0103 Oral     SpO2 06/05/16 0059 97 %     Weight 06/05/16 0103 240 lb (108.863 kg)     Height 06/05/16 0103 5\' 4"  (1.626 m)     Head Cir --      Peak Flow --      Pain Score 06/05/16 0106 5     Pain Loc --      Pain Edu? --      Excl. in [place]? --     Constitutional: Alert and oriented. Well appearing and in no acute distress. Eyes: Conjunctivae are normal. PERRL. EOMI. Head: Atraumatic. Nose: No congestion/rhinnorhea. Mouth/Throat: Mucous membranes are moist.  Oropharynx non-erythematous. Neck: No stridor.  Cardiovascular: Normal rate, regular rhythm. Grossly normal heart sounds.  Good peripheral circulation. Respiratory: Normal respiratory effort.  No retractions. Lungs CTAB. Gastrointestinal: Soft and nontender. No distention. No abdominal  bruits. No CVA tenderness. Musculoskeletal: No lower extremity tenderness nor edema.  No joint effusions. No back pain. Neurologic:  Normal speech and language. There is slight right-sided weakness compared to the left. Patient reports this is stable. Finger-nose is normal No gait instability. Skin:  Skin is warm, dry and intact. No rash noted. Psychiatric: Mood and affect are normal. Speech and behavior are normal.  ____________________________________________   LABS (all labs ordered are listed, but only abnormal results are displayed)  Labs Reviewed  BASIC METABOLIC PANEL - Abnormal; Notable for the following:    Potassium 2.8 (*)    Glucose, Bld 209 (*)    All other components within normal limits  CBC WITH DIFFERENTIAL/PLATELET - Abnormal; Notable for the following:    Hemoglobin 11.9 (*)    RDW 16.6 (*)    All other components within normal limits  TROPONIN I  URINALYSIS COMPLETEWITH MICROSCOPIC ([facility] ONLY)  POCT CBG (FASTING - GLUCOSE)-MANUAL ENTRY   ____________________________________________  EKG  EKG read and interpreted by me shows normal sinus rhythm rate of 88 left axis Q's in III and F and nonspecific ST-T wave changes ____________________________________________  RADIOLOGY  CT of the head and neck show no acute pathology per radiology ____________________________________________   PROCEDURES  Patient complained of a headache after returning from CT says is better after Tylenol  ____________________________________________   INITIAL IMPRESSION / ASSESSMENT AND PLAN / ED COURSE  Pertinent labs & imaging results that were available during my care of the patient were reviewed by me and considered in my medical decision making (see chart for details).   ____________________________________________   FINAL CLINICAL IMPRESSION(S) / ED DIAGNOSES  Final diagnoses:  Fall, initial encounter  Post-traumatic headache, not intractable, unspecified  chronicity pattern      NEW MEDICATIONS STARTED DURING THIS VISIT:  New Prescriptions   No medications on file     Note:  This document was prepared using Dragon voice recognition software and may include unintentional dictation errors.    Nena Polio, MD 06/05/16 0532  Patient now says her pain is severe and we have done nothing to help her pain even know she has twice told us her pain is gone away with Motrin and Tylenol she wants something else I will give her 2 mg of morphine IM since she has had her IV taken out and she was discharged waiting for EMS transport. He not anticipate 2 mg of morphine will make her significantly woozy so that she  would be a danger when she goes home  Nena Polio, MD 06/05/16 [phone]  Since patient continues to complain of worsening headache I will re-CT her to make sure there is no bleeding. If this is normal I will let her go home. I'm worried about giving her any substantiative narcotic pain medicines and she tells that she lives by herself and is always a little unsteady because of her chronic right-sided weakness. I do not want her to fall and hit her head again or break anything else.  Nena Polio, MD 06/05/16 [phone]  At the present time patient says her headache is stable is not any worse it's just her baseline she does not want another head CT. She told me she did not have nausea vomiting before she now says she has nausea and vomiting frequently and has had for some time she did have a CT of the stomach without contrast which did not show any masses there was a small duodenal lipoma that was done months ago. I will give the patient some Zofran ODT if she needed for nausea and vomiting. Patient wants to go home and I will allow her to do so. She says she has a doctor who makes house calls. She can follow up with that doctor or return if she gets any worse at all.  Nena Polio, MD 06/05/16 [phone]

## 2016-06-05 NOTE — ED Notes (Signed)
Pt states now has increased pain to frontal scalp. md notified, order for morphine received.

## 2016-06-16 ENCOUNTER — Encounter: Payer: Self-pay | Admitting: Emergency Medicine

## 2016-06-16 ENCOUNTER — Emergency Department: Payer: Medicare Other

## 2016-06-16 ENCOUNTER — Observation Stay
Admission: EM | Admit: 2016-06-16 | Discharge: 2016-06-21 | Disposition: A | Discharge status: Home / Self Care | Payer: Medicare Other | Attending: Internal Medicine | Admitting: Internal Medicine

## 2016-06-16 DIAGNOSIS — I480 Paroxysmal atrial fibrillation: Secondary | ICD-10-CM | POA: Insufficient documentation

## 2016-06-16 DIAGNOSIS — K5900 Constipation, unspecified: Secondary | ICD-10-CM | POA: Diagnosis not present

## 2016-06-16 DIAGNOSIS — Z79891 Long term (current) use of opiate analgesic: Secondary | ICD-10-CM | POA: Diagnosis not present

## 2016-06-16 DIAGNOSIS — I2510 Atherosclerotic heart disease of native coronary artery without angina pectoris: Secondary | ICD-10-CM | POA: Diagnosis not present

## 2016-06-16 DIAGNOSIS — I110 Hypertensive heart disease with heart failure: Secondary | ICD-10-CM | POA: Diagnosis not present

## 2016-06-16 DIAGNOSIS — Z96649 Presence of unspecified artificial hip joint: Secondary | ICD-10-CM | POA: Insufficient documentation

## 2016-06-16 DIAGNOSIS — Z950 Presence of cardiac pacemaker: Secondary | ICD-10-CM | POA: Insufficient documentation

## 2016-06-16 DIAGNOSIS — I5032 Chronic diastolic (congestive) heart failure: Secondary | ICD-10-CM | POA: Insufficient documentation

## 2016-06-16 DIAGNOSIS — Z79899 Other long term (current) drug therapy: Secondary | ICD-10-CM | POA: Diagnosis not present

## 2016-06-16 DIAGNOSIS — Z9889 Other specified postprocedural states: Secondary | ICD-10-CM | POA: Insufficient documentation

## 2016-06-16 DIAGNOSIS — Z6841 Body Mass Index (BMI) 40.0 and over, adult: Secondary | ICD-10-CM | POA: Diagnosis not present

## 2016-06-16 DIAGNOSIS — Z951 Presence of aortocoronary bypass graft: Secondary | ICD-10-CM | POA: Diagnosis not present

## 2016-06-16 DIAGNOSIS — Z952 Presence of prosthetic heart valve: Secondary | ICD-10-CM | POA: Insufficient documentation

## 2016-06-16 DIAGNOSIS — K219 Gastro-esophageal reflux disease without esophagitis: Secondary | ICD-10-CM | POA: Insufficient documentation

## 2016-06-16 DIAGNOSIS — S0990XA Unspecified injury of head, initial encounter: Secondary | ICD-10-CM | POA: Insufficient documentation

## 2016-06-16 DIAGNOSIS — I442 Atrioventricular block, complete: Secondary | ICD-10-CM | POA: Insufficient documentation

## 2016-06-16 DIAGNOSIS — E876 Hypokalemia: Secondary | ICD-10-CM | POA: Diagnosis not present

## 2016-06-16 DIAGNOSIS — Z888 Allergy status to other drugs, medicaments and biological substances status: Secondary | ICD-10-CM | POA: Insufficient documentation

## 2016-06-16 DIAGNOSIS — Z96659 Presence of unspecified artificial knee joint: Secondary | ICD-10-CM | POA: Insufficient documentation

## 2016-06-16 DIAGNOSIS — W19XXXA Unspecified fall, initial encounter: Secondary | ICD-10-CM | POA: Diagnosis not present

## 2016-06-16 DIAGNOSIS — M797 Fibromyalgia: Secondary | ICD-10-CM | POA: Insufficient documentation

## 2016-06-16 DIAGNOSIS — Z7982 Long term (current) use of aspirin: Secondary | ICD-10-CM | POA: Diagnosis not present

## 2016-06-16 DIAGNOSIS — G629 Polyneuropathy, unspecified: Secondary | ICD-10-CM | POA: Diagnosis not present

## 2016-06-16 DIAGNOSIS — N390 Urinary tract infection, site not specified: Secondary | ICD-10-CM | POA: Insufficient documentation

## 2016-06-16 DIAGNOSIS — R531 Weakness: Secondary | ICD-10-CM | POA: Diagnosis not present

## 2016-06-16 DIAGNOSIS — R296 Repeated falls: Secondary | ICD-10-CM | POA: Diagnosis not present

## 2016-06-16 DIAGNOSIS — I350 Nonrheumatic aortic (valve) stenosis: Secondary | ICD-10-CM | POA: Insufficient documentation

## 2016-06-16 DIAGNOSIS — J45909 Unspecified asthma, uncomplicated: Secondary | ICD-10-CM | POA: Diagnosis not present

## 2016-06-16 DIAGNOSIS — E785 Hyperlipidemia, unspecified: Secondary | ICD-10-CM | POA: Diagnosis not present

## 2016-06-16 LAB — CBC WITH DIFFERENTIAL/PLATELET
BASOS ABS: 0 10*3/uL (ref 0–0.1)
BASOS PCT: 1 %
EOS ABS: 0.1 10*3/uL (ref 0–0.7)
Eosinophils Relative: 1 %
HEMATOCRIT: 40.7 % (ref 35.0–47.0)
Hemoglobin: 13 g/dL (ref 12.0–16.0)
Lymphocytes Relative: 29 %
Lymphs Abs: 2.6 10*3/uL (ref 1.0–3.6)
MCH: 27.3 pg (ref 26.0–34.0)
MCHC: 32 g/dL (ref 32.0–36.0)
MCV: 85.6 fL (ref 80.0–100.0)
MONO ABS: 0.6 10*3/uL (ref 0.2–0.9)
Monocytes Relative: 7 %
NEUTROS ABS: 5.7 10*3/uL (ref 1.4–6.5)
NEUTROS PCT: 62 %
Platelets: 258 10*3/uL (ref 150–440)
RBC: 4.76 MIL/uL (ref 3.80–5.20)
RDW: 17.1 % — AB (ref 11.5–14.5)
WBC: 9 10*3/uL (ref 3.6–11.0)

## 2016-06-16 LAB — URINALYSIS COMPLETE WITH MICROSCOPIC (ARMC ONLY)
Bilirubin Urine: NEGATIVE
Glucose, UA: NEGATIVE mg/dL
Hgb urine dipstick: NEGATIVE
Ketones, ur: NEGATIVE mg/dL
Nitrite: NEGATIVE
Protein, ur: NEGATIVE mg/dL
Specific Gravity, Urine: 1.01 (ref 1.005–1.030)
pH: 8 (ref 5.0–8.0)

## 2016-06-16 LAB — COMPREHENSIVE METABOLIC PANEL
ALT: 27 U/L (ref 14–54)
ANION GAP: 10 (ref 5–15)
AST: 36 U/L (ref 15–41)
Albumin: 3.9 g/dL (ref 3.5–5.0)
Alkaline Phosphatase: 88 U/L (ref 38–126)
BILIRUBIN TOTAL: 0.6 mg/dL (ref 0.3–1.2)
BUN: 10 mg/dL (ref 6–20)
CALCIUM: 9.1 mg/dL (ref 8.9–10.3)
CO2: 29 mmol/L (ref 22–32)
CREATININE: 0.59 mg/dL (ref 0.44–1.00)
Chloride: 102 mmol/L (ref 101–111)
Glucose, Bld: 152 mg/dL — ABNORMAL HIGH (ref 65–99)
Potassium: 3.4 mmol/L — ABNORMAL LOW (ref 3.5–5.1)
Sodium: 141 mmol/L (ref 135–145)
TOTAL PROTEIN: 7.5 g/dL (ref 6.5–8.1)

## 2016-06-16 LAB — BLOOD GAS, VENOUS
Acid-Base Excess: 4.6 mmol/L — ABNORMAL HIGH (ref 0.0–3.0)
Bicarbonate: 31.6 mEq/L — ABNORMAL HIGH (ref 21.0–28.0)
Patient temperature: 37
pCO2, Ven: 56 mmHg (ref 44.0–60.0)
pH, Ven: 7.36 (ref 7.320–7.430)
pO2, Ven: <31 mmHg — ABNORMAL LOW (ref 31.0–45.0)

## 2016-06-16 LAB — MRSA PCR SCREENING: MRSA by PCR: NEGATIVE

## 2016-06-16 LAB — TROPONIN I: Troponin I: <0.03 ng/mL (ref <0.03)

## 2016-06-16 MED ORDER — AZELASTINE HCL 0.1 % NA SOLN: 1.0000 | Freq: Two times a day (BID) | NASAL | Status: DC | PRN

## 2016-06-16 MED ORDER — DEXTROSE 5 % IV SOLN
1.0000 g | INTRAVENOUS | Status: DC
  Administered 2016-06-16 – 2016-06-18 (×3): 1 g via INTRAVENOUS
  Filled 2016-06-16 (×4): qty 10

## 2016-06-16 MED ORDER — ACETAMINOPHEN 325 MG PO TABS
650.0000 mg | ORAL_TABLET | Freq: Four times a day (QID) | ORAL | Status: DC | PRN
  Administered 2016-06-17 – 2016-06-20 (×5): 650 mg via ORAL
  Filled 2016-06-16 (×5): qty 2

## 2016-06-16 MED ORDER — IPRATROPIUM-ALBUTEROL 18-103 MCG/ACT IN AERO: 2.0000 | INHALATION_SPRAY | Freq: Four times a day (QID) | RESPIRATORY_TRACT | Status: DC | PRN

## 2016-06-16 MED ORDER — CYANOCOBALAMIN 1000 MCG/ML IJ SOLN
1000.0000 ug | INTRAMUSCULAR | Status: DC
  Administered 2016-06-16: 1000 ug via INTRAMUSCULAR
  Filled 2016-06-16: qty 1

## 2016-06-16 MED ORDER — CYCLOSPORINE 0.05 % OP EMUL
1.0000 [drp] | Freq: Two times a day (BID) | OPHTHALMIC | Status: DC
  Administered 2016-06-16 – 2016-06-20 (×8): 1 [drp] via OPHTHALMIC
  Filled 2016-06-16 (×9): qty 1

## 2016-06-16 MED ORDER — METOPROLOL SUCCINATE ER 50 MG PO TB24
50.0000 mg | ORAL_TABLET | Freq: Every day | ORAL | Status: DC
  Administered 2016-06-17 – 2016-06-21 (×5): 50 mg via ORAL
  Filled 2016-06-16 (×5): qty 1

## 2016-06-16 MED ORDER — CEPHALEXIN 500 MG PO CAPS: 500.0000 mg | ORAL_CAPSULE | Freq: Two times a day (BID) | ORAL | Status: DC

## 2016-06-16 MED ORDER — SODIUM CHLORIDE 0.9 % IV SOLN
INTRAVENOUS | Status: DC
  Administered 2016-06-16: 22:00:00 via INTRAVENOUS
  Administered 2016-06-16: 1 mL via INTRAVENOUS

## 2016-06-16 MED ORDER — ACETAMINOPHEN 325 MG PO TABS
650.0000 mg | ORAL_TABLET | Freq: Once | ORAL | Status: DC
  Filled 2016-06-16: qty 2

## 2016-06-16 MED ORDER — MAGNESIUM HYDROXIDE 400 MG/5ML PO SUSP
15.0000 mL | Freq: Every day | ORAL | Status: DC | PRN
  Administered 2016-06-18 (×2): 15 mL via ORAL
  Filled 2016-06-16 (×4): qty 30

## 2016-06-16 MED ORDER — ONDANSETRON HCL 4 MG PO TABS
4.0000 mg | ORAL_TABLET | Freq: Four times a day (QID) | ORAL | Status: DC | PRN
  Administered 2016-06-18 – 2016-06-21 (×5): 4 mg via ORAL
  Filled 2016-06-16 (×5): qty 1

## 2016-06-16 MED ORDER — ASPIRIN EC 81 MG PO TBEC
81.0000 mg | DELAYED_RELEASE_TABLET | Freq: Every day | ORAL | Status: DC
  Administered 2016-06-17 – 2016-06-21 (×5): 81 mg via ORAL
  Filled 2016-06-16 (×5): qty 1

## 2016-06-16 MED ORDER — ENOXAPARIN SODIUM 40 MG/0.4ML ~~LOC~~ SOLN
40.0000 mg | SUBCUTANEOUS | Status: DC
  Administered 2016-06-16: 40 mg via SUBCUTANEOUS
  Filled 2016-06-16: qty 0.4

## 2016-06-16 MED ORDER — IPRATROPIUM-ALBUTEROL 0.5-2.5 (3) MG/3ML IN SOLN: 3.0000 mL | Freq: Four times a day (QID) | RESPIRATORY_TRACT | Status: DC | PRN

## 2016-06-16 MED ORDER — PANTOPRAZOLE SODIUM 40 MG PO TBEC
40.0000 mg | DELAYED_RELEASE_TABLET | Freq: Two times a day (BID) | ORAL | Status: DC
  Administered 2016-06-16 – 2016-06-21 (×10): 40 mg via ORAL
  Filled 2016-06-16 (×10): qty 1

## 2016-06-16 MED ORDER — AMLODIPINE BESYLATE 5 MG PO TABS
5.0000 mg | ORAL_TABLET | Freq: Every day | ORAL | Status: DC
  Administered 2016-06-17 – 2016-06-21 (×5): 5 mg via ORAL
  Filled 2016-06-16 (×5): qty 1

## 2016-06-16 MED ORDER — GABAPENTIN 300 MG PO CAPS
300.0000 mg | ORAL_CAPSULE | Freq: Two times a day (BID) | ORAL | Status: DC
  Administered 2016-06-16 – 2016-06-21 (×10): 300 mg via ORAL
  Filled 2016-06-16 (×10): qty 1

## 2016-06-16 MED ORDER — TRAMADOL HCL 50 MG PO TABS
50.0000 mg | ORAL_TABLET | Freq: Once | ORAL | Status: AC
  Administered 2016-06-16: 50 mg via ORAL
  Filled 2016-06-16: qty 1

## 2016-06-16 MED ORDER — LOSARTAN POTASSIUM 25 MG PO TABS
25.0000 mg | ORAL_TABLET | Freq: Every day | ORAL | Status: DC
  Administered 2016-06-16 – 2016-06-21 (×6): 25 mg via ORAL
  Filled 2016-06-16 (×6): qty 1

## 2016-06-16 MED ORDER — HYDROCODONE-ACETAMINOPHEN 10-325 MG PO TABS
1.0000 | ORAL_TABLET | ORAL | Status: DC | PRN
  Administered 2016-06-16 – 2016-06-21 (×7): 1 via ORAL
  Filled 2016-06-16 (×7): qty 1

## 2016-06-16 MED ORDER — CEPHALEXIN 500 MG PO CAPS
500.0000 mg | ORAL_CAPSULE | Freq: Once | ORAL | Status: AC
Start: 2016-06-16 — End: 2016-06-16
  Administered 2016-06-16: 500 mg via ORAL
  Filled 2016-06-16: qty 1

## 2016-06-16 MED ORDER — ONDANSETRON HCL 4 MG/2ML IJ SOLN
4.0000 mg | Freq: Once | INTRAMUSCULAR | Status: AC
  Administered 2016-06-16: 4 mg via INTRAVENOUS
  Filled 2016-06-16: qty 2

## 2016-06-16 MED ORDER — ONDANSETRON HCL 4 MG/2ML IJ SOLN
4.0000 mg | Freq: Four times a day (QID) | INTRAMUSCULAR | Status: DC | PRN
  Filled 2016-06-16: qty 2

## 2016-06-16 MED ORDER — ACETAMINOPHEN 650 MG RE SUPP: 650.0000 mg | Freq: Four times a day (QID) | RECTAL | Status: DC | PRN

## 2016-06-16 NOTE — ED Notes (Signed)
Pt taken off bedpan 

## 2016-06-16 NOTE — ED Notes (Signed)
Reports falling 2 days ago and hit head, denies LOC.  States she has had nausea and diarrhea x 5 days. A&O, MAE, grips equal.

## 2016-06-16 NOTE — ED Notes (Signed)
Pt reports had mechanical fall yesterday and hit head, no LOC. Has had NVD prior to fall.  Pt is diabetic. Very unsteady on feet. C/o headache.  Skin cool to touch but dry.

## 2016-06-16 NOTE — ED Notes (Signed)
Pt placed on bedpan. Will use call bell when done.

## 2016-06-16 NOTE — ED Notes (Signed)
Pt given juice and sandwich tray.  Also given water.

## 2016-06-16 NOTE — ED Notes (Signed)
RN in room for call bell.  Pt has been hitting call bell multiple times.  Multiple nurses and NT in room to check on pt.  Patient remains to hit call bell even minutes after staff walk out to ask for something new.  Pt would like something for headache and reports may need to have bowel movement but would like to wait until she has to urinate so can do both at same time.  Dr Reita Cliche notified of pain.

## 2016-06-16 NOTE — H&P (Signed)
[facility] NAME: Michele Schultz    MR#:  [id number]  DATE OF BIRTH:  20-Jul-1931   DATE OF ADMISSION:  06/16/2016  PRIMARY CARE PHYSICIAN: [name]   REQUESTING/REFERRING PHYSICIAN: Lord  CHIEF COMPLAINT:   Chief Complaint  Patient presents with  . Fall  Weakness  HISTORY OF PRESENT ILLNESS:  Michele Schultz  is a 82 y.o. female with a known history of Essential hypertension, coronary artery disease who is presenting with generalized weakness. Patient states she has had progressive weakness most prominent over the last 3 days which includes excessive falling she states that "I fall daily" this is despite the use of a walker. Fortunately, she has suffered no major traumas. She seems somewhat confused and is a poor historian. Whenever attempting to stand in the emergency department she is unable to do so without falling. She is from an independent living facility. She is also complaining of increased urination, dysuria for the past 3 days.  PAST MEDICAL HISTORY:   Past Medical History  Diagnosis Date  . Hypertensive heart disease   . Anemia   . Neuralgia, neuritis, and radiculitis, unspecified   . Coronary artery disease     a. s/p 2v CABG 11/12 (VG-LAD, VG-OM1); b. Lexiscan 08/2015: low risk, no ischemia, EF 55-65%.  . Aortic stenosis, severe     a. s/p Magna Ease pericardial tissue valve size 21 mm replacement in 10/2011 for severe AS 11/12; b. echo 07/2015: EF 55-60% mod concentric LVH, GR1DD, LA mildly dilated, PASP 45 mm Hg  . Onychia and paronychia of toe   . Osteoarthrosis, unspecified whether generalized or localized, pelvic region and thigh     mainly in her back and knees  . Enthesopathy of hip region   . Esophageal reflux     followed by Dr.Seigal. stabilized with a combination of Nexium and Zantac  . Knee joint replacement by other means   . Stress incontinence, female     followed by Dr.Cope  .  Complete heart block [facility])     a. s/p St Jude PPM 10/2011 (Ser # [id number]).  . Cardiac pacemaker -st Judes     11/12  . Chronic diastolic heart failure ([place])     a. echo 2014: EF 55-60%, no RWMA, GR1DD, PASP 47 mm Hg; b. echo 07/2015: EF 55-60% mod concentric LVH, GR1DD, LA mildly dilated, PASP 45 mm Hg  . Type II or unspecified type diabetes mellitus without mention of complication, not stated as uncontrolled     a. pt. reports that she is borderline   . Spinal stenosis   . Neuropathy ([place])   . Morbid obesity ([place])   . RAD (reactive airway disease)     a. chronic SOB  . Fibromyalgia   . HLD (hyperlipidemia)   . PAF (paroxysmal atrial fibrillation) ([place])     a. brief episodes of AF previously noted on device interrogations.  . Wide-complex tachycardia ([place])     a. Noted 4/10 - brief episode in ED. Noted again 4/21 in clinic->felt most likely to be atrial tach.    PAST SURGICAL HISTORY:   Past Surgical History  Procedure Laterality Date  . Replacement total knee  11/2006    right knee  . Nasal sinus surgery  2009  . Total hip arthroplasty  09/2010  . Cardiac catheterization  08/2009    50% stenosis distal left main, 50% stenosis ostial left circumflex.   . Cardiac catheterization  at [facility]  . Eye surgery      IOL/ after cataracts removed- bilateral   . Aortic valve replacement  10/17/2011    Procedure: AORTIC VALVE REPLACEMENT (AVR);  Surgeon: Melrose Nakayama, MD;  Location: [facility];  Service: Open Heart Surgery;  Laterality: N/A;  . Coronary artery bypass graft  10/17/2011    Procedure: CORONARY ARTERY BYPASS GRAFTING (CABG);  Surgeon: Melrose Nakayama, MD;  Location: [facility];  Service: Open Heart Surgery;  Laterality: N/A;  Times Two, using right leg greater saphenous vein harvested endoscopically  . Permanent pacemaker insertion N/A 10/20/2011    Procedure: PERMANENT PACEMAKER INSERTION;  Surgeon: Thompson Grayer, MD;  Location: [facility] CATH LAB;  Service: Cardiovascular;   Laterality: N/A;    SOCIAL HISTORY:   Social History  Substance Use Topics  . Smoking status: Never Smoker   . Smokeless tobacco: Never Used  . Alcohol Use: No    FAMILY HISTORY:   Family History  Problem Relation Age of Onset  . Diabetes Other   . Heart disease Sister   . Heart disease Brother   . Heart disease Brother   . Heart disease Brother   . Anesthesia problems Neg Hx   . Hypotension Neg Hx   . Malignant hyperthermia Neg Hx   . Pseudochol deficiency Neg Hx     DRUG ALLERGIES:   Allergies  Allergen Reactions  . Citalopram Other (See Comments)    Reaction:  Altered mental status   . Cymbalta [Duloxetine Hcl] Other (See Comments)    Reaction:  Sedative for pt   . Imipramine Other (See Comments)    Reaction:  Unknown   . Proton Pump Inhibitors Other (See Comments)    Reaction:  Unknown   . Venlafaxine Nausea And Vomiting and Other (See Comments)    Reaction:  Dizziness     REVIEW OF SYSTEMS:  REVIEW OF SYSTEMS:  CONSTITUTIONAL: Denies fevers, chills, Positive fatigue, weakness.  EYES: Denies blurred vision, double vision, or eye pain.  EARS, NOSE, THROAT: Denies tinnitus, ear pain, hearing loss.  RESPIRATORY: denies cough, shortness of breath, wheezing  CARDIOVASCULAR: Denies chest pain, palpitations, edema.  GASTROINTESTINAL: Denies nausea, vomiting, diarrhea, abdominal pain.  GENITOURINARY:Positive Dysuria  ENDOCRINE: Denies nocturia or thyroid problems. HEMATOLOGIC AND LYMPHATIC: Denies easy bruising or bleeding.  SKIN: Denies rash or lesions.  MUSCULOSKELETAL: Denies pain in neck, back, shoulder, knees, hips, or further arthritic symptoms.  NEUROLOGIC: Denies paralysis, paresthesias.  PSYCHIATRIC: Denies anxiety or depressive symptoms. Otherwise full review of systems performed by me is negative.   MEDICATIONS AT HOME:   Prior to Admission medications   Medication Sig Start Date End Date Taking? Authorizing Provider  albuterol-ipratropium  (COMBIVENT) 18-103 MCG/ACT inhaler Inhale 2 puffs into the lungs every 6 (six) hours as needed for wheezing. 06/19/13  Yes Minna Merritts, MD  amLODipine (NORVASC) 5 MG tablet Take 1 tablet (5 mg total) by mouth daily. 11/01/15  Yes Lauree Chandler, NP  aspirin EC 81 MG tablet Take 81 mg by mouth daily.   Yes Historical Provider, MD  azelastine (ASTELIN) 0.1 % nasal spray Place 1 spray into both nostrils as needed for rhinitis.    Yes Historical Provider, MD  benzonatate (TESSALON) 100 MG capsule Take 200 mg by mouth 3 (three) times daily as needed for cough.    Yes Historical Provider, MD  cyanocobalamin (,VITAMIN B-12,) 1000 MCG/ML injection Inject 1,000 mcg into the muscle every 30 (thirty) days.   Yes Historical Provider,  MD  cycloSPORINE (RESTASIS) 0.05 % ophthalmic emulsion Place 1 drop into both eyes 2 (two) times daily.    Yes Historical Provider, MD  furosemide (LASIX) 20 MG tablet Take 1 tablet (20 mg total) by mouth every other day. 04/06/16  Yes Rogelia Mire, NP  gabapentin (NEURONTIN) 300 MG capsule Take 1 capsule (300 mg total) by mouth 2 (two) times daily. 11/01/15  Yes Lauree Chandler, NP  HYDROcodone-acetaminophen (NORCO) 10-325 MG tablet Take 1 tablet by mouth every 4 (four) hours as needed for moderate pain. Reported on 05/30/2016   Yes Historical Provider, MD  losartan (COZAAR) 25 MG tablet Take 25 mg by mouth daily.   Yes Historical Provider, MD  magnesium hydroxide (MILK OF MAGNESIA) 400 MG/5ML suspension Take 15 mLs by mouth daily as needed for mild constipation.    Yes Historical Provider, MD  metoCLOPramide (REGLAN) 10 MG tablet Take 1 tablet (10 mg total) by mouth every 8 (eight) hours as needed for nausea or vomiting. 04/23/16  Yes Earleen Newport, MD  metoprolol succinate (TOPROL-XL) 50 MG 24 hr tablet Take 1 tablet (50 mg total) by mouth daily. 11/01/15  Yes Lauree Chandler, NP  nitroGLYCERIN (NITROSTAT) 0.4 MG SL tablet Place 1 tablet (0.4 mg total) under the  tongue every 5 (five) minutes as needed for chest pain. 01/24/13  Yes Minna Merritts, MD  pantoprazole (PROTONIX) 40 MG tablet Take 40 mg by mouth 2 (two) times daily.     Yes Historical Provider, MD  phenazopyridine (PYRIDIUM) 200 MG tablet Take 1 tablet (200 mg total) by mouth 3 (three) times daily as needed for pain. 07/10/15 07/09/16 Yes Earleen Newport, MD  potassium chloride (K-DUR) 10 MEQ tablet Take 10 mEq by mouth daily.   Yes Historical Provider, MD  promethazine (PHENERGAN) 25 MG tablet Take 1 tablet (25 mg total) by mouth every 6 (six) hours as needed for nausea or vomiting. 06/17/14  Yes Minna Merritts, MD  traMADol (ULTRAM) 50 MG tablet Take 1 tablet (50 mg total) by mouth every 6 (six) hours as needed. 04/23/16 04/23/17 Yes Earleen Newport, MD  cephALEXin (KEFLEX) 500 MG capsule Take 1 capsule (500 mg total) by mouth 2 (two) times daily. 06/16/16   Lisa Roca, MD      VITAL SIGNS:  Blood pressure 162/99, pulse 84, temperature 98.1 F (36.7 C), temperature source Oral, resp. rate 28, height 5\' 4"  (1.626 m), weight 220 lb (99.791 kg), SpO2 93 %.  PHYSICAL EXAMINATION:  VITAL SIGNS: Filed Vitals:   06/16/16 1730 06/16/16 1800  BP: 158/96 162/99  Pulse: 83 84  Temp:    Resp: 21 28   GENERAL:82 y.o.female currently in no acute distress.  HEAD: Normocephalic, atraumatic.  EYES: Pupils equal, round, reactive to light. Extraocular muscles intact. No scleral icterus.  MOUTH: Moist mucosal membrane. Dentition intact. No abscess noted.  EAR, NOSE, THROAT: Clear without exudates. No external lesions.  NECK: Supple. No thyromegaly. No nodules. No JVD.  PULMONARY: Clear to ascultation, without wheeze rails or rhonci. No use of accessory muscles, Good respiratory effort. good air entry bilaterally CHEST: Nontender to palpation.  CARDIOVASCULAR: S1 and S2. Regular rate and rhythm. No murmurs, rubs, or gallops. No edema. Pedal pulses 2+ bilaterally.  GASTROINTESTINAL: Soft,  nontender, nondistended. No masses. Positive bowel sounds. No hepatosplenomegaly.  MUSCULOSKELETAL: No swelling, clubbing, or edema. Range of motion full in all extremities.  NEUROLOGIC: Cranial nerves II through XII are intact. No gross focal neurological deficits.  Sensation intact. Reflexes intact.  SKIN: No ulceration, lesions, rashes, or cyanosis. Skin warm and dry. Turgor intact.  PSYCHIATRIC: Mood, affect within normal limits. The patient is awake, alert and oriented x 2. Insight, judgmentpoor at this time. Very repetitive speech pattern, asked the same questions over and over again   LABORATORY PANEL:   CBC  Recent Labs Lab 06/16/16 1658  WBC 9.0  HGB 13.0  HCT 40.7  PLT 258   ------------------------------------------------------------------------------------------------------------------  Chemistries   Recent Labs Lab 06/16/16 1750  NA 141  K 3.4*  CL 102  CO2 29  GLUCOSE 152*  BUN 10  CREATININE 0.59  CALCIUM 9.1  AST 36  ALT 27  ALKPHOS 88  BILITOT 0.6   ------------------------------------------------------------------------------------------------------------------  Cardiac Enzymes  Recent Labs Lab 06/16/16 1750  TROPONINI <0.03   ------------------------------------------------------------------------------------------------------------------  RADIOLOGY:  Ct Head Wo Contrast  06/16/2016  CLINICAL DATA:  Head injury and nausea after fall without reported loss consciousness. EXAM: CT HEAD WITHOUT CONTRAST TECHNIQUE: Contiguous axial images were obtained from the base of the skull through the vertex without intravenous contrast. COMPARISON:  CT scan June 05, 2016. FINDINGS: Bony calvarium appears intact. Mild diffuse cortical atrophy is noted. Mild chronic ischemic white matter disease is noted. No mass effect or midline shift is noted. Ventricular size is within normal limits. There is no evidence of mass lesion, hemorrhage or acute infarction.  IMPRESSION: Mild diffuse cortical atrophy. Mild chronic ischemic white matter disease. No acute intracranial abnormality seen. Electronically Signed   By: Marijo Conception, M.D.   On: 06/16/2016 14:30    EKG:   Orders placed or performed during the hospital encounter of 06/16/16  . ED EKG  . ED EKG    IMPRESSION AND PLAN:   82 year old [place] American female history of essential hypertension who is presenting with weakness  1. Generalized weakness/falls: Likely multifactorial physical therapy evaluation check vitamin D level II. Urinary tract infection site unspecified continue ceftriaxone and follow urine culture 3. Essential hypertension: Metoprolol, Norvasc and Cozaar 4. GERD without esophagitis PPI therapy   All the records are reviewed and case discussed with ED provider. Management plans discussed with the patient, family and they are in agreement.  CODE STATUS: Full  TOTAL TIME TAKING CARE OF THIS PATIENT: 33  minutes.    Hower,  Karenann Cai.D on 06/16/2016 at 8:53 PM  Between 7am to 6pm - Pager - [phone]  After 6pm: House Pager: - [phone]  [facility]  Office  [phone]  CC: Primary care physician; [name]

## 2016-06-16 NOTE — ED Notes (Signed)
Pt transported to room 150 

## 2016-06-16 NOTE — ED Provider Notes (Addendum)
[facility] Emergency Department Provider Note   ____________________________________________  Time seen: I have reviewed the triage vital signs and the triage nursing note.  HISTORY  Chief Complaint Fall   Historian Patient  HPI Michele Schultz is a 82 y.o. female who is here for evaluation after striking her head. She states that she has chronic nausea and vomiting for what sounds like years, for which she has seen primary care as well as gastroenterology and takes nausea medicines at home, but she's also had diarrhea for a few days which is watery, nonbloody. She states that she got off balance and tripped yesterday and struck the back of her head but just "wait it out "and then this morning she had a similar episode where she tipped forward and struck the front of her head. She's having a bit of a headache and decided that she wanted to get checked out.  Her nausea and vomiting is similar to chronic, nothing new there.  The diarrhea does sound new, but she would not necessarily have come here for this complaint -- she is here for evaluation for the fall and head injury with headache.  Headache is mild to moderate and frontal in location. She is not having any weakness or numbness or seizures and did not have loss of consciousness. Denies neck pain. Denies extremity pain. Denies chest pain or trouble breathing.    Past Medical History  Diagnosis Date  . Hypertensive heart disease   . Anemia   . Neuralgia, neuritis, and radiculitis, unspecified   . Coronary artery disease     a. s/p 2v CABG 11/12 (VG-LAD, VG-OM1); b. Lexiscan 08/2015: low risk, no ischemia, EF 55-65%.  . Aortic stenosis, severe     a. s/p Magna Ease pericardial tissue valve size 21 mm replacement in 10/2011 for severe AS 11/12; b. echo 07/2015: EF 55-60% mod concentric LVH, GR1DD, LA mildly dilated, PASP 45 mm Hg  . Onychia and paronychia of toe   . Osteoarthrosis, unspecified whether  generalized or localized, pelvic region and thigh     mainly in her back and knees  . Enthesopathy of hip region   . Esophageal reflux     followed by Dr.Seigal. stabilized with a combination of Nexium and Zantac  . Knee joint replacement by other means   . Stress incontinence, female     followed by Dr.Cope  . Complete heart block [facility])     a. s/p St Jude PPM 10/2011 (Ser # [id number]).  . Cardiac pacemaker -st Judes     11/12  . Chronic diastolic heart failure ([place])     a. echo 2014: EF 55-60%, no RWMA, GR1DD, PASP 47 mm Hg; b. echo 07/2015: EF 55-60% mod concentric LVH, GR1DD, LA mildly dilated, PASP 45 mm Hg  . Type II or unspecified type diabetes mellitus without mention of complication, not stated as uncontrolled     a. pt. reports that she is borderline   . Spinal stenosis   . Neuropathy ([place])   . Morbid obesity ([place])   . RAD (reactive airway disease)     a. chronic SOB  . Fibromyalgia   . HLD (hyperlipidemia)   . PAF (paroxysmal atrial fibrillation) ([place])     a. brief episodes of AF previously noted on device interrogations.  . Wide-complex tachycardia ([place])     a. Noted 4/10 - brief episode in ED. Noted again 4/21 in clinic->felt most likely to be atrial tach.    Patient  Active Problem List   Diagnosis Date Noted  . Hypertensive heart disease   . Wide-complex tachycardia ([place])   . Pain in the chest   . Emesis   . Weakness of both legs   . Coronary artery disease involving coronary bypass graft of native heart with unspecified angina pectoris   . S/P AVR (aortic valve replacement)   . Falls frequently   . HLD (hyperlipidemia)   . Fibromyalgia   . RAD (reactive airway disease)   . Morbid obesity ([place])   . Aortic stenosis, severe   . Chronic pain 06/17/2014  . Pain in the muscles 06/19/2013  . Chest pain 06/19/2013  . Wheezing 06/19/2013  . Generalized weakness 06/03/2013  . Dyspnea 01/27/2013  . Nausea 03/05/2012  . Constipation 03/05/2012  . Atrial  fibrillation-non sustained 02/09/2012  . Cardiac pacemaker -st Judes   . Chronic diastolic heart failure ([place])   . Coronary artery disease   . Long term (current) use of anticoagulants 01/03/2012  . S/P CABG x 2 11/14/2011  . S/P aortic valve replacement with porcine valve 11/14/2011  . Back pain 11/14/2011  . Complete heart block ([place]) 10/20/2011  . HTN (hypertension) 09/15/2011  . Spinal stenosis 03/22/2011  . Fatigue 03/22/2011  . Hyperlipidemia 03/22/2011    Past Surgical History  Procedure Laterality Date  . Replacement total knee  11/2006    right knee  . Nasal sinus surgery  2009  . Total hip arthroplasty  09/2010  . Cardiac catheterization  08/2009    50% stenosis distal left main, 50% stenosis ostial left circumflex.   . Cardiac catheterization      at [facility]  . Eye surgery      IOL/ after cataracts removed- bilateral   . Aortic valve replacement  10/17/2011    Procedure: AORTIC VALVE REPLACEMENT (AVR);  Surgeon: Melrose Nakayama, MD;  Location: [place];  Service: Open Heart Surgery;  Laterality: N/A;  . Coronary artery bypass graft  10/17/2011    Procedure: CORONARY ARTERY BYPASS GRAFTING (CABG);  Surgeon: Melrose Nakayama, MD;  Location: [place];  Service: Open Heart Surgery;  Laterality: N/A;  Times Two, using right leg greater saphenous vein harvested endoscopically  . Permanent pacemaker insertion N/A 10/20/2011    Procedure: PERMANENT PACEMAKER INSERTION;  Surgeon: Thompson Grayer, MD;  Location: [facility] CATH LAB;  Service: Cardiovascular;  Laterality: N/A;    Current Outpatient Rx  Name  Route  Sig  Dispense  Refill  . albuterol-ipratropium (COMBIVENT) 18-103 MCG/ACT inhaler   Inhalation   Inhale 2 puffs into the lungs every 6 (six) hours as needed for wheezing.   1 Inhaler   4   . amLODipine (NORVASC) 5 MG tablet   Oral   Take 1 tablet (5 mg total) by mouth daily.   30 tablet   6   . aspirin EC 81 MG tablet   Oral   Take 81 mg by mouth daily.         Marland Kitchen  azelastine (ASTELIN) 0.1 % nasal spray   Each Nare   Place 1 spray into both nostrils as needed for rhinitis.          Marland Kitchen benzonatate (TESSALON) 100 MG capsule   Oral   Take 200 mg by mouth 3 (three) times daily as needed for cough.          . cephALEXin (KEFLEX) 500 MG capsule   Oral   Take 1 capsule (500 mg total) by mouth 2 (  two) times daily.   14 capsule   0   . cyanocobalamin (,VITAMIN B-12,) 1000 MCG/ML injection   Intramuscular   Inject 1,000 mcg into the muscle every 30 (thirty) days.         . cycloSPORINE (RESTASIS) 0.05 % ophthalmic emulsion   Both Eyes   Place 1 drop into both eyes 2 (two) times daily.          . furosemide (LASIX) 20 MG tablet   Oral   Take 1 tablet (20 mg total) by mouth every other day.   30 tablet   6   . gabapentin (NEURONTIN) 300 MG capsule   Oral   Take 1 capsule (300 mg total) by mouth 2 (two) times daily.   60 capsule   2   . HYDROcodone-acetaminophen (NORCO) 10-325 MG tablet   Oral   Take 1 tablet by mouth every 4 (four) hours as needed for moderate pain. Reported on 05/30/2016         . losartan (COZAAR) 25 MG tablet   Oral   Take 25 mg by mouth daily.         . magnesium hydroxide (MILK OF MAGNESIA) 400 MG/5ML suspension   Oral   Take 15 mLs by mouth daily as needed for mild constipation.          . metoCLOPramide (REGLAN) 10 MG tablet   Oral   Take 1 tablet (10 mg total) by mouth every 8 (eight) hours as needed for nausea or vomiting.   30 tablet   1   . metoprolol succinate (TOPROL-XL) 50 MG 24 hr tablet   Oral   Take 1 tablet (50 mg total) by mouth daily.   30 tablet   2   . nitroGLYCERIN (NITROSTAT) 0.4 MG SL tablet   Sublingual   Place 1 tablet (0.4 mg total) under the tongue every 5 (five) minutes as needed for chest pain.   90 tablet   3   . pantoprazole (PROTONIX) 40 MG tablet   Oral   Take 40 mg by mouth 2 (two) times daily.           . phenazopyridine (PYRIDIUM) 200 MG tablet   Oral    Take 1 tablet (200 mg total) by mouth 3 (three) times daily as needed for pain.   20 tablet   0   . potassium chloride (K-DUR) 10 MEQ tablet   Oral   Take 10 mEq by mouth daily.         . promethazine (PHENERGAN) 25 MG tablet   Oral   Take 1 tablet (25 mg total) by mouth every 6 (six) hours as needed for nausea or vomiting.   30 tablet   0   . traMADol (ULTRAM) 50 MG tablet   Oral   Take 1 tablet (50 mg total) by mouth every 6 (six) hours as needed.   20 tablet   0     Allergies Citalopram; Cymbalta; Imipramine; Proton pump inhibitors; and Venlafaxine  Family History  Problem Relation Age of Onset  . Diabetes Other   . Heart disease Sister   . Heart disease Brother   . Heart disease Brother   . Heart disease Brother   . Anesthesia problems Neg Hx   . Hypotension Neg Hx   . Malignant hyperthermia Neg Hx   . Pseudochol deficiency Neg Hx     Social History Social History  Substance Use Topics  . Smoking status: Never Smoker   .  Smokeless tobacco: Never Used  . Alcohol Use: No    Review of Systems  Constitutional: Negative for fever. Eyes: Negative for visual changes. ENT: Negative for sore throat. Cardiovascular: Negative for chest pain. Respiratory: Negative for shortness of breath. Gastrointestinal: Negative for abdominal pain, positive for nausea, vomiting and diarrhea. Genitourinary: Negative for dysuria. Musculoskeletal: Negative for back pain. Skin: Negative for rash. Neurological: Positive for headache. 10 point Review of Systems otherwise negative ____________________________________________   PHYSICAL EXAM:  VITAL SIGNS: ED Triage Vitals  Enc Vitals Group     BP 06/16/16 1307 136/79 mmHg     Pulse Rate 06/16/16 1307 87     Resp 06/16/16 1307 18     Temp 06/16/16 1307 98.1 F (36.7 C)     Temp Source 06/16/16 1307 Oral     SpO2 06/16/16 1307 99 %     Weight 06/16/16 1307 220 lb (99.791 kg)     Height 06/16/16 1307 5\' 4"  (1.626 m)      Head Cir --      Peak Flow --      Pain Score --      Pain Loc --      Pain Edu? --      Excl. in [place]? --      Constitutional: Alert and oriented. Well appearing and in no distress. HEENT   Head: Normocephalic and atraumatic.      Eyes: Conjunctivae are normal. PERRL. Normal extraocular movements.      Ears:         Nose: No congestion/rhinnorhea.   Mouth/Throat: Mucous membranes are moist.   Neck: No stridor. Cardiovascular/Chest: Normal rate, regular rhythm.  No murmurs, rubs, or gallops. Respiratory: Normal respiratory effort without tachypnea nor retractions. Breath sounds are clear and equal bilaterally. No wheezes/rales/rhonchi. Gastrointestinal: Soft. No distention, no guarding, no rebound. Nontender.  Obese Genitourinary/rectal:Deferred Musculoskeletal: Nontender with normal range of motion in all extremities. No joint effusions.  No lower extremity tenderness.  No edema. Neurologic:  Disoriented to time, but otherwise decent historian and no focal neurologic findings on exam.  Normal speech and language. No gross or focal neurologic deficits are appreciated. Skin:  Skin is warm, dry and intact. No rash noted. Psychiatric: Mood and affect are normal. Speech and behavior are normal. Patient exhibits appropriate insight and judgment.  ____________________________________________   EKG I, Lisa Roca, MD, the attending physician have personally viewed and interpreted all ECGs.  80 bpm. Normal sinus rhythm. Narrow QRS with normal axis. Nonspecific T-wave ____________________________________________  LABS (pertinent positives/negatives)  Labs Reviewed  CBC WITH DIFFERENTIAL/PLATELET - Abnormal; Notable for the following:    RDW 17.1 (*)    All other components within normal limits  URINALYSIS COMPLETEWITH MICROSCOPIC ([facility] ONLY) - Abnormal; Notable for the following:    Color, Urine YELLOW (*)    APPearance CLOUDY (*)    Leukocytes, UA 1+ (*)    Bacteria, UA  RARE (*)    Squamous Epithelial / LPF 0-5 (*)    All other components within normal limits  BLOOD GAS, VENOUS - Abnormal; Notable for the following:    pO2, Ven <31.0 (*)    Bicarbonate 31.6 (*)    Acid-Base Excess 4.6 (*)    All other components within normal limits  COMPREHENSIVE METABOLIC PANEL - Abnormal; Notable for the following:    Potassium 3.4 (*)    Glucose, Bld 152 (*)    All other components within normal limits  URINE CULTURE  TROPONIN I  ____________________________________________  RADIOLOGY All Xrays were viewed by me. Imaging interpreted by Radiologist.  CT head: IMPRESSION: Mild diffuse cortical atrophy. Mild chronic ischemic white matter disease. No acute intracranial abnormality seen. __________________________________________  PROCEDURES  Procedure(s) performed: None  Critical Care performed: None  ____________________________________________   ED COURSE / ASSESSMENT AND PLAN  Pertinent labs & imaging results that were available during my care of the patient were reviewed by me and considered in my medical decision making (see chart for details).   By reading her prior history and notes, it appears the nausea and vomiting are chronic and related to gastroparesis. Next  In terms of the diarrhea, it sounds like this is been a few days now, but nonbloody, no significant abdominal pain, no fever, and her blood work is reassuring.  I am going to treat for urinary tract infection with Keflex and sent a culture.  In terms of the traumatic striking of the head last night and this morning, her CT head is negative.  She is okay for outpatient discharge and follow-up.  Addended to include, patient states that she is so weak that she really can't walk and she typically gets around at home alone at her independent living with a cane and a walker and she fell yesterday and fell this morning and feels even weaker today like she can't take care of herself at  home. She is from independent living, and she may need a higher level of care. She does have a urinary tract infections, so perhaps this is contributing to her now generalized weakness and inability to stand and walk safely on her own. I discussed with the hospitalist for admission, she may need physical therapy evaluation and placement to assisted living.  The nurse got her up and she was almost unable to bear her own weight and certainly unsafe trying to take any steps. She required multiple assist in order to stand and move at all. He is unsafe to be at home right now alone.   CONSULTATIONS:   Hospitalist for admission   Patient / Family / Caregiver informed of clinical course, medical decision-making process, and agree with plan.   I discussed return precautions, follow-up instructions, and discharged instructions with patient and/or family.  Addended to include, patient states that she is so weak that she really can't walk and she typically gets around at home alone at her independent living with a cane and a walker and she fell yesterday and fell this morning and feels even weaker today like she can't take care of herself at home. She is from independent living, and she may need a higher level of care. She does have a urinary tract infections, so perhaps this is contributing to her now generalized weakness and inability to stand and walk safely on her own. I discussed with the hospitalist for admission, she may need physical therapy evaluation and placement to assisted living. ___________________________________________   FINAL CLINICAL IMPRESSION(S) / ED DIAGNOSES   Final diagnoses:  UTI (lower urinary tract infection)  Fall, initial encounter  Generalized weakness              Note: This dictation was prepared with Dragon dictation. Any transcriptional errors that result from this process are unintentional   Lisa Roca, MD 06/16/16 1909  Lisa Roca, MD 06/16/16  2001

## 2016-06-17 DIAGNOSIS — R531 Weakness: Secondary | ICD-10-CM | POA: Diagnosis not present

## 2016-06-17 LAB — MAGNESIUM: MAGNESIUM: 2.1 mg/dL (ref 1.7–2.4)

## 2016-06-17 MED ORDER — POTASSIUM CHLORIDE CRYS ER 20 MEQ PO TBCR
40.0000 meq | EXTENDED_RELEASE_TABLET | Freq: Once | ORAL | Status: AC
  Administered 2016-06-17: 40 meq via ORAL
  Filled 2016-06-17: qty 2

## 2016-06-17 MED ORDER — ENOXAPARIN SODIUM 40 MG/0.4ML ~~LOC~~ SOLN
40.0000 mg | Freq: Two times a day (BID) | SUBCUTANEOUS | Status: DC
  Administered 2016-06-17 – 2016-06-21 (×9): 40 mg via SUBCUTANEOUS
  Filled 2016-06-17 (×9): qty 0.4

## 2016-06-17 NOTE — Care Management Note (Signed)
Case Management Note  Patient Details  Name: [name] MRN: [id number] Date of Birth: 1931-10-22  Subjective/Objective:    Message left on phone of niece Hughie Closs to discuss discharge planning back to [facility] [place]. Case manager discussed placement issues with the family during a previous hospitalization in April 2017 and strongly encouraged the family to assist Mrs Popiel to apply for Medicaid. Current plan is for discharge home tomorrow with home health.                  Action/Plan:   Expected Discharge Date:                  Expected Discharge Plan:     In-House Referral:     Discharge planning Services     Post Acute Care Choice:    Choice offered to:     DME Arranged:    DME Agency:     HH Arranged:    HH Agency:     Status of Service:     If discussed at [employer] of Stay Meetings, dates discussed:    Additional Comments:  Quincie Haroon A, RN 06/17/2016, 11:35 AM

## 2016-06-17 NOTE — Clinical Social Work Placement (Signed)
   CLINICAL SOCIAL WORK PLACEMENT  NOTE  Date:  06/17/2016  Patient Details  Name: Michele Schultz MRN: [id number] Date of Birth: 1930-12-08  Clinical Social Work is seeking post-discharge placement for this patient at the [place] level of care (*CSW will initial, date and re-position this form in  chart as items are completed):  Yes   Patient/family provided with [facility] Work Department's list of facilities offering this level of care within the geographic area requested by the patient (or if unable, by the patient's family).  Yes   Patient/family informed of their freedom to choose among providers that offer the needed level of care, that participate in Medicare, Medicaid or managed care program needed by the patient, have an available bed and are willing to accept the patient.  Yes   Patient/family informed of [facility]'s ownership interest in [facility] and [facility], as well as of the fact that they are under no obligation to receive care at these facilities.  PASRR submitted to EDS on       PASRR number received on       Existing PASRR number confirmed on 06/17/16     FL2 transmitted to all facilities in geographic area requested by pt/family on 06/17/16     FL2 transmitted to all facilities within larger geographic area on       Patient informed that his/her managed care company has contracts with or will negotiate with certain facilities, including the following:            Patient/family informed of bed offers received.  Patient chooses bed at       Physician recommends and patient chooses bed at      Patient to be transferred to   on  .  Patient to be transferred to facility by       Patient family notified on   of transfer.  Name of family member notified:        PHYSICIAN       Additional Comment:    _______________________________________________ Svea Pusch, Bronwen Betters, LCSW 06/17/2016, 2:10 PM

## 2016-06-17 NOTE — NC FL2 (Signed)
[place] LEVEL OF CARE SCREENING TOOL     IDENTIFICATION  Patient Name: Michele Schultz Birthdate: [birth year] Sex: female Admission Date (Current Location): 06/16/2016  [place] and [place] Number:  Engineering geologist and Address:  [facility], [address] [place], [place] [address]      Provider Number: [id number]  Attending Physician Name and Address:  Demetrios Loll, MD  Relative Name and Phone Number:       Current Level of Care: Hospital Recommended Level of Care: [place] Prior Approval Number:    Date Approved/Denied:   PASRR Number:  [id number])  Discharge Plan: SNF    Current Diagnoses: Patient Active Problem List   Diagnosis Date Noted  . Hypertensive heart disease   . Wide-complex tachycardia ([place])   . Pain in the chest   . Emesis   . Weakness of both legs   . Coronary artery disease involving coronary bypass graft of native heart with unspecified angina pectoris   . S/P AVR (aortic valve replacement)   . Falls frequently   . HLD (hyperlipidemia)   . Fibromyalgia   . RAD (reactive airway disease)   . Morbid obesity ([place])   . Aortic stenosis, severe   . Chronic pain 06/17/2014  . Pain in the muscles 06/19/2013  . Chest pain 06/19/2013  . Wheezing 06/19/2013  . Generalized weakness 06/03/2013  . Dyspnea 01/27/2013  . Nausea 03/05/2012  . Constipation 03/05/2012  . Atrial fibrillation-non sustained 02/09/2012  . Cardiac pacemaker -st Judes   . Chronic diastolic heart failure ([place])   . Coronary artery disease   . Long term (current) use of anticoagulants 01/03/2012  . S/P CABG x 2 11/14/2011  . S/P aortic valve replacement with porcine valve 11/14/2011  . Back pain 11/14/2011  . Complete heart block ([place]) 10/20/2011  . HTN (hypertension) 09/15/2011  . Spinal stenosis 03/22/2011  . Fatigue 03/22/2011  . Hyperlipidemia 03/22/2011    Orientation RESPIRATION BLADDER Height & Weight      Self, Place  Normal Incontinent Weight: 273 lb 11.2 oz (124.15 kg) Height:  5\' 4"  (162.6 cm)  BEHAVIORAL SYMPTOMS/MOOD NEUROLOGICAL BOWEL NUTRITION STATUS   (none )  (none ) Continent Diet (Diet: Heart Healthy )  AMBULATORY STATUS COMMUNICATION OF NEEDS Skin   Extensive Assist Verbally Normal                       Personal Care Assistance Level of Assistance  Bathing, Feeding, Dressing Bathing Assistance: Limited assistance Feeding assistance: Independent Dressing Assistance: Limited assistance     Functional Limitations Info  Sight, Hearing, Speech Sight Info: Adequate Hearing Info: Adequate Speech Info: Adequate    SPECIAL CARE FACTORS FREQUENCY  PT (By licensed PT)     PT Frequency:  (2-3)              Contractures      Additional Factors Info  Code Status, Allergies Code Status Info:  (Full Code. ) Allergies Info:  (Citalopram, Cymbalta, Imipramine, Proton Pump Inhibitors, Venlafaxine)           Current Medications (06/17/2016):  This is the current hospital active medication list Current Facility-Administered Medications  Medication Dose Route Frequency Provider Last Rate Last Dose  . acetaminophen (TYLENOL) tablet 650 mg  650 mg Oral Q6H PRN Lytle Butte, MD   650 mg at 06/17/16 0743   Or  . acetaminophen (TYLENOL) suppository 650 mg  650 mg Rectal  Q6H PRN Lytle Butte, MD      . acetaminophen (TYLENOL) tablet 650 mg  650 mg Oral Once Lisa Roca, MD   650 mg at 06/16/16 1854  . amLODipine (NORVASC) tablet 5 mg  5 mg Oral Daily Lytle Butte, MD   5 mg at 06/17/16 0744  . aspirin EC tablet 81 mg  81 mg Oral Daily Lytle Butte, MD   81 mg at 06/17/16 0744  . azelastine (ASTELIN) 0.1 % nasal spray 1 spray  1 spray Each Nare BID PRN Lytle Butte, MD      . cefTRIAXone (ROCEPHIN) 1 g in dextrose 5 % 50 mL IVPB  1 g Intravenous Q24H Lytle Butte, MD   1 g at 06/16/16 2308  . cyanocobalamin ((VITAMIN B-12)) injection 1,000 mcg  1,000 mcg  Intramuscular Q30 days Lytle Butte, MD   1,000 mcg at 06/16/16 2308  . cycloSPORINE (RESTASIS) 0.05 % ophthalmic emulsion 1 drop  1 drop Both Eyes BID Lytle Butte, MD   1 drop at 06/17/16 0745  . enoxaparin (LOVENOX) injection 40 mg  40 mg Subcutaneous Q12H Demetrios Loll, MD   40 mg at 06/17/16 1244  . gabapentin (NEURONTIN) capsule 300 mg  300 mg Oral BID Lytle Butte, MD   300 mg at 06/17/16 0743  . HYDROcodone-acetaminophen (NORCO) 10-325 MG per tablet 1 tablet  1 tablet Oral Q4H PRN Lytle Butte, MD   1 tablet at 06/17/16 1240  . ipratropium-albuterol (DUONEB) 0.5-2.5 (3) MG/3ML nebulizer solution 3 mL  3 mL Nebulization Q6H PRN Lytle Butte, MD      . losartan (COZAAR) tablet 25 mg  25 mg Oral Daily Lytle Butte, MD   25 mg at 06/17/16 0743  . magnesium hydroxide (MILK OF MAGNESIA) suspension 15 mL  15 mL Oral Daily PRN Lytle Butte, MD      . metoprolol succinate (TOPROL-XL) 24 hr tablet 50 mg  50 mg Oral Daily Lytle Butte, MD   50 mg at 06/17/16 0743  . ondansetron (ZOFRAN) tablet 4 mg  4 mg Oral Q6H PRN Lytle Butte, MD       Or  . ondansetron [facility]) injection 4 mg  4 mg Intravenous Q6H PRN Lytle Butte, MD      . pantoprazole (PROTONIX) EC tablet 40 mg  40 mg Oral BID Lytle Butte, MD   40 mg at 06/17/16 [id number]     Discharge Medications: Please see discharge summary for a list of discharge medications.  Relevant Imaging Results:  Relevant Lab Results:   Additional Information  (SSN: [id number])  Brandan Glauber, Bronwen Betters, LCSW

## 2016-06-17 NOTE — Progress Notes (Signed)
82 y/o F ordered Lovenox 40 mg daily for DVT prophylaxis. Due to weight > 100 kg and BMI > 40, will increase Lovenox dosing to 40 mg q 12 h.   Ulice Dash, PharmD Clinical Pharmacist

## 2016-06-17 NOTE — Care Management Obs Status (Signed)
[place] NOTIFICATION   Patient Details  Name: Michele Schultz MRN: [id number] Date of Birth: 16-Oct-1931   Medicare Observation Status Notification Given:  Yes (Mrs Lamont is unable to comprehend the [facility] form. Phone message left for niece to please return call from CM. )    Kortnee Bas A, RN 06/17/2016, 3:22 PM

## 2016-06-17 NOTE — Progress Notes (Signed)
[place] at [place] NAME: Kathrine Krammes    MR#:  [id number]  DATE OF BIRTH:  [birth year]  SUBJECTIVE:  CHIEF COMPLAINT:   Chief Complaint  Patient presents with  . Fall   Frontal headache and weakness. REVIEW OF SYSTEMS:  Review of Systems  Constitutional: Positive for malaise/fatigue. Negative for fever and chills.  Respiratory: Negative for cough, shortness of breath and wheezing.   Cardiovascular: Negative for chest pain, palpitations, orthopnea and leg swelling.  Gastrointestinal: Negative for nausea, vomiting, abdominal pain and diarrhea.  Musculoskeletal: Negative for joint pain.  Neurological: Positive for weakness and headaches. Negative for dizziness, tingling, sensory change, speech change and focal weakness.  Psychiatric/Behavioral: Negative for depression. The patient is not nervous/anxious.     DRUG ALLERGIES:   Allergies  Allergen Reactions  . Citalopram Other (See Comments)    Reaction:  Altered mental status   . Cymbalta [Duloxetine Hcl] Other (See Comments)    Reaction:  Sedative for pt   . Imipramine Other (See Comments)    Reaction:  Unknown   . Proton Pump Inhibitors Other (See Comments)    Reaction:  Unknown   . Venlafaxine Nausea And Vomiting and Other (See Comments)    Reaction:  Dizziness    VITALS:  Blood pressure 137/59, pulse 78, temperature 98.5 F (36.9 C), temperature source Oral, resp. rate 16, height 5\' 4"  (1.626 m), weight 273 lb 11.2 oz (124.15 kg), SpO2 100 %. PHYSICAL EXAMINATION:  Physical Exam  Constitutional: She is oriented to person, place, and time and well-developed, well-nourished, and in no distress.  HENT:  Head: Normocephalic.  Eyes: Conjunctivae and EOM are normal. Pupils are equal, round, and reactive to light.  Neck: No JVD present. No thyromegaly present.  Cardiovascular: Normal rate, regular rhythm and normal heart sounds.  Exam reveals no gallop and no friction rub.     No murmur heard. Pulmonary/Chest: No stridor. No respiratory distress. She has no wheezes. She has no rales. She exhibits no tenderness.  Abdominal: She exhibits no distension. There is no tenderness. There is no rebound.  Musculoskeletal: She exhibits no edema.  Neurological: She is alert and oriented to person, place, and time. She has normal reflexes.  Skin: No rash noted. No erythema.  Psychiatric: Mood, memory, affect and judgment normal.   LABORATORY PANEL:   CBC  Recent Labs Lab 06/16/16 1658  WBC 9.0  HGB 13.0  HCT 40.7  PLT 258   ------------------------------------------------------------------------------------------------------------------ Chemistries   Recent Labs Lab 06/16/16 1750 06/17/16 0755  NA 141  --   K 3.4*  --   CL 102  --   CO2 29  --   GLUCOSE 152*  --   BUN 10  --   CREATININE 0.59  --   CALCIUM 9.1  --   MG  --  2.1  AST 36  --   ALT 27  --   ALKPHOS 88  --   BILITOT 0.6  --    RADIOLOGY:  Ct Head Wo Contrast  06/16/2016  CLINICAL DATA:  Head injury and nausea after fall without reported loss consciousness. EXAM: CT HEAD WITHOUT CONTRAST TECHNIQUE: Contiguous axial images were obtained from the base of the skull through the vertex without intravenous contrast. COMPARISON:  CT scan June 05, 2016. FINDINGS: Bony calvarium appears intact. Mild diffuse cortical atrophy is noted. Mild chronic ischemic white matter disease is noted. No mass effect or midline shift is noted. Ventricular size  is within normal limits. There is no evidence of mass lesion, hemorrhage or acute infarction. IMPRESSION: Mild diffuse cortical atrophy. Mild chronic ischemic white matter disease. No acute intracranial abnormality seen. Electronically Signed   By: Marijo Conception, M.D.   On: 06/16/2016 14:30   ASSESSMENT AND PLAN:   82 year old African American female history of essential hypertension who is presenting with weakness  1. Generalized weakness/falls: Likely  multifactorial,  F/u physical therapy evaluation.  2. Urinary tract infection site unspecified,  continue ceftriaxone and follow urine culture.  3. Essential hypertension: Metoprolol, Norvasc and Cozaar 4. GERD without esophagitis PPI therapy  * Hypokalemia. Given KCl po.  All the records are reviewed and case discussed with Care Management/Social Worker. Management plans discussed with the patient, family and they are in agreement.  CODE STATUS: full code.  TOTAL TIME TAKING CARE OF THIS PATIENT: 30 minutes.   More than 50% of the time was spent in counseling/coordination of care: YES  POSSIBLE D/C IN 1-2 DAYS, DEPENDING ON CLINICAL CONDITION.   Demetrios Loll M.D on 06/17/2016 at 1:07 PM  Between 7am to 6pm - Pager - [phone]  After 6pm go to [email or web address] - Proofreader  [facility] [place] [facility]  Office  [phone]  CC: Primary care physician; [name]  Note: This dictation was prepared with Dragon dictation along with smaller phrase technology. Any transcriptional errors that result from this process are unintentional.

## 2016-06-17 NOTE — Evaluation (Addendum)
Physical Therapy Evaluation Patient Details Name: Michele Schultz MRN: [id number] DOB: 1931/11/24 Today's Date: 06/17/2016   History of Present Illness  Michele Schultz is an 82yo black female who comes to [facility] on 7/14 following 3 days of progressive weakness and multiple falls at home. She also reports that during one of her falls earlier this week, she hit her forehead on her bedpost and has had HA and vertigo since. She reports that she falls every day due to legs giving out, which is consistent with her report in April of 2017 at that admission. She resides at [place] where she has in home aide 2d/W for 2 hours each time. At baseline, she is quite sedentatry, walks only from bed to BR for voiding, otherwise performs all mobility with dependently in [facility] for appointments. PMH: FM, RA, HTN, CAD s/p CABG, B THA, B TKA, aortic stenosis, and s/p bovine aortic valve replacement.      Clinical Impression  Upon entry, the patient is received semirecumbent in bed, no family/caregiver present. The pt is awake and agreeable to participate. Pt c/o HA and vertigo several times throughout session. The pt is alert and oriented to person, place, and situation, but has some difficulty with time orientation. The pt is pleasant, conversational, and following simple and multi-step commands consistently. Self report of PLOF does not speak to chronicity of deficits detailed in the medical record as documented during past PT evalutions. Functional mobility assessment demonstrates moderate to severe weakness but is minimally off from baseline, the pt now limited in tolerance to standing, whereas she AMB short distances with frequent falls at baseline. All mobility in session is tolerated poorly due to c/o room spinning sensation. The patient is at high risk for falls as evidence by abundant falls at home, high falls anxiety, and multiple LOB demonstrated throughout session. The patient does not sound appropriate for ILF  at this point, and needs greater assistance with mobility and self care.    Patient presenting with impairment of strength, range of motion, balance, and activity tolerance, limiting ability to perform ADL and mobility tasks at  baseline level of function. Patient will benefit from skilled intervention to address the above impairments and limitations, in order to restore to prior level of function, improve patient safety upon discharge, and to decrease falls risk.       Follow Up Recommendations SNF;Supervision for mobility/OOB    Equipment Recommendations  None recommended by PT    Recommendations for Other Services       Precautions / Restrictions Precautions Precautions: Fall      Mobility  Bed Mobility Overal bed mobility: +2 for physical assistance;Needs Assistance Bed Mobility: Supine to Sit;Sit to Supine     Supine to sit: +2 for physical assistance;Max assist Sit to supine: +2 for physical assistance;Total assist      Transfers Overall transfer level: Needs assistance Equipment used: None (Bari Pedi RW ) Transfers: Sit to/from Stand Sit to Stand: Min guard;From elevated surface         General transfer comment: 2x, elevated bed, limited balance in the sagittal plane  Ambulation/Gait Ambulation/Gait assistance:  (declines trial due to fear of legs giving out. )              Stairs            Wheelchair Mobility    Modified Rankin (Stroke Patients Only)       Balance Overall balance assessment: History of Falls  Pertinent Vitals/Pain Pain Assessment: 0-10 Pain Score: 9  Pain Location: HA; also 'hurt every where' due to FM, RA Pain Descriptors / Indicators: Aching Pain Intervention(s): Limited activity within patient's tolerance;Monitored during session;Other (comment)    Home Living Family/patient expects to be discharged to:: Other (Comment)               Home  Equipment: Bedside commode;Shower seat;Walker - 4 wheels;Wheelchair - manual Additional Comments: from [place]    Prior Function Level of Independence: Needs assistance      ADL's / Homemaking Assistance Needed: Needs assistance for bathing/dressing  Comments: RW bed to BR only; unable to self propel in WC.      Hand Dominance        Extremity/Trunk Assessment   Upper Extremity Assessment: Generalized weakness           Lower Extremity Assessment: Generalized weakness         Communication   Communication: No difficulties  Cognition Arousal/Alertness: Awake/alert Behavior During Therapy: WFL for tasks assessed/performed Overall Cognitive Status: Within Functional Limits for tasks assessed       Memory: Decreased short-term memory              General Comments      Exercises        Assessment/Plan    PT Assessment Patient needs continued PT services  PT Diagnosis Difficulty walking;Generalized weakness   PT Problem List Decreased strength;Decreased activity tolerance;Decreased balance;Decreased mobility;Pain;Decreased knowledge of use of DME;Decreased safety awareness;Obesity  PT Treatment Interventions DME instruction;Functional mobility training;Therapeutic activities;Therapeutic exercise;Balance training;Gait training;Patient/family education;Wheelchair mobility training   PT Goals (Current goals can be found in the Care Plan section) Acute Rehab PT Goals Patient Stated Goal: resolve HA, reduce falls PT Goal Formulation: With patient Time For Goal Achievement: 07/01/16 Potential to Achieve Goals: Fair    Frequency Min 2X/week   Barriers to discharge Decreased caregiver support      Co-evaluation               End of Session Equipment Utilized During Treatment: Gait belt Activity Tolerance: Patient limited by pain;Patient limited by fatigue Patient left: in bed;with call bell/phone within reach;with bed alarm set Nurse  Communication: Mobility status;Other (comment)    Functional Limitation: Changing and maintaining body position Changing and Maintaining Body Position Current Status [id number]): At least 60 percent but less than 80 percent impaired, limited or restricted Changing and Maintaining Body Position Goal Status [id number]): At least 60 percent but less than 80 percent impaired, limited or restricted    Time: 1208-1230 PT Time Calculation (min) (ACUTE ONLY): 22 min   Charges:   PT Evaluation $PT Eval High Complexity: 1 Procedure PT Treatments $Therapeutic Activity: 8-22 mins   PT G Codes:   PT G-Codes **NOT FOR INPATIENT CLASS** Functional Limitation: Changing and maintaining body position Changing and Maintaining Body Position Current Status [id number]): At least 60 percent but less than 80 percent impaired, limited or restricted Changing and Maintaining Body Position Goal Status [id number]): At least 60 percent but less than 80 percent impaired, limited or restricted    1:25 PM, 06/17/2016 Etta Grandchild, PT, DPT Physical Therapist - [place] [phone] (mobile)

## 2016-06-17 NOTE — Clinical Social Work Note (Signed)
Clinical Social Work Assessment  Patient Details  Name: Michele Schultz MRN: [id number] Date of Birth: 1931-02-03  Date of referral:  06/17/16               Reason for consult:  Facility Placement                Permission sought to share information with:  Chartered certified accountant granted to share information::  Yes, Verbal Permission Granted  Name::      [place]::   [facility] and surrounding counties   Relationship::     Contact Information:     Housing/Transportation Living arrangements for the past 2 months:  [place], [place] of Information:  Patient Patient Interpreter Needed:  None Criminal Activity/Legal Involvement Pertinent to Current Situation/Hospitalization:  No - Comment as needed Significant Relationships:  Other Family Members Lives with:  Self Do you feel safe going back to the place where you live?  Yes Need for family participation in patient care:  Yes (Comment)  Care giving concerns:  Patient lives at [facility].    Social Worker assessment / plan:  Holiday representative (CSW) received consult for SNF/ ALF placement. CSW is familiar with patient from last admission to [facility] in April 2017. On last admission PT was recommending SNF patient was under Medicare observation, did not have a payer for SNF and was given information on Medicaid as well as her family. Patient is under Medicare observation on this admission and does not have Medicaid in according to [facility]. Per admissions patient does not have Medicaid showing up however if she recently received it in the past couple of weeks it may not show up in the system yet. Patient is adamant that she has Medicaid and is getting her family member Michele Schultz to bring the Medicaid information up to the hospital. Patient is requesting placement at a SNF or ALF at this time and reports that she has no children and nobody to  take care of her. CSW explained that she is once again under Medicare observation and does not have a payer for SNF unless she can pay out of pocket or has long term care Medicaid. Per patient she cannot pay out of pocket and is certain she has Medicaid. CSW explained that bed search can be started however if it is determined that patient does not have Medicaid then she will have to D/C back to [facility] with home health. CSW also explained that with [employer] the bed search will have to extend outside of [facility]. Patient verbalized her understating.  FL2 complete and faxed out. CSW will continue to follow and assist as needed.     Employment status:  Disabled (Comment on whether or not currently receiving Disability), Retired Forensic scientist:  Medicare PT Recommendations:  [place] / Referral to community resources:  [place] (SNF under [employer] )  Patient/Family's Response to care:  Patient requested placement and reports that she has Medicaid.   Patient/Family's Understanding of and Emotional Response to Diagnosis, Current Treatment, and Prognosis:  Patient was pleasant and thanked CSW for assistance.   Emotional Assessment Appearance:  Appears stated age Attitude/Demeanor/Rapport:    Affect (typically observed):  Accepting, Adaptable, Pleasant Orientation:  Oriented to Self, Oriented to Place, Oriented to  Time, Fluctuating Orientation (Suspected and/or reported Sundowners) Alcohol / Substance use:  Not Applicable Psych involvement (Current and /or in the community):  No (Comment)  Discharge Needs  Concerns to be addressed:  Discharge Planning Concerns Readmission within the last 30 days:  No Current discharge risk:  Dependent with Mobility Barriers to Discharge:  Continued Medical Work up   [employer], Bronwen Betters, LCSW 06/17/2016, 2:13 PM

## 2016-06-18 DIAGNOSIS — R531 Weakness: Secondary | ICD-10-CM | POA: Diagnosis not present

## 2016-06-18 MED ORDER — MAGNESIUM HYDROXIDE 400 MG/5ML PO SUSP
60.0000 mL | Freq: Every day | ORAL | Status: DC
  Administered 2016-06-20 (×2): 60 mL via ORAL
  Filled 2016-06-18: qty 60

## 2016-06-18 MED ORDER — ALUM & MAG HYDROXIDE-SIMETH 200-200-20 MG/5ML PO SUSP: 30.0000 mL | ORAL | Status: DC | PRN

## 2016-06-18 NOTE — Progress Notes (Signed)
[place] at [place] NAME: Michele Schultz    MR#:  [id number]  DATE OF BIRTH:  [birth year]  SUBJECTIVE:  CHIEF COMPLAINT:   Chief Complaint  Patient presents with  . Fall   Frontal headache and weakness, unable to move. REVIEW OF SYSTEMS:  Review of Systems  Constitutional: Positive for malaise/fatigue. Negative for fever and chills.  Respiratory: Negative for cough, shortness of breath and wheezing.   Cardiovascular: Negative for chest pain, palpitations, orthopnea and leg swelling.  Gastrointestinal: Negative for nausea, vomiting, abdominal pain and diarrhea.  Musculoskeletal: Negative for joint pain.  Neurological: Positive for weakness and headaches. Negative for dizziness, tingling, sensory change, speech change and focal weakness.  Psychiatric/Behavioral: Negative for depression. The patient is not nervous/anxious.     DRUG ALLERGIES:   Allergies  Allergen Reactions  . Citalopram Other (See Comments)    Reaction:  Altered mental status   . Cymbalta [Duloxetine Hcl] Other (See Comments)    Reaction:  Sedative for pt   . Imipramine Other (See Comments)    Reaction:  Unknown   . Proton Pump Inhibitors Other (See Comments)    Reaction:  Unknown   . Venlafaxine Nausea And Vomiting and Other (See Comments)    Reaction:  Dizziness    VITALS:  Blood pressure 159/97, pulse 102, temperature 98.6 F (37 C), temperature source Oral, resp. rate 18, height 5\' 4"  (1.626 m), weight 273 lb 11.2 oz (124.15 kg), SpO2 97 %. PHYSICAL EXAMINATION:  Physical Exam  Constitutional: She is oriented to person, place, and time and well-developed, well-nourished, and in no distress.  morbid obese. HENT:  Head: Normocephalic.  Eyes: Conjunctivae and EOM are normal. Pupils are equal, round, and reactive to light.  Neck: No JVD present. No thyromegaly present.  Cardiovascular: Normal rate, regular rhythm and normal heart sounds.  Exam reveals no  gallop and no friction rub.   No murmur heard. Pulmonary/Chest: No stridor. No respiratory distress. She has no wheezes. She has no rales. She exhibits no tenderness.  Abdominal: She exhibits no distension. There is no tenderness. There is no rebound.  Musculoskeletal: She exhibits no edema.  Neurological: She is alert and oriented to person, place, and time. She has normal reflexes. power 3/5 in extremities. Skin: No rash noted. No erythema.  Psychiatric: Mood, memory, affect and judgment normal.   LABORATORY PANEL:   CBC  Recent Labs Lab 06/16/16 1658  WBC 9.0  HGB 13.0  HCT 40.7  PLT 258   ------------------------------------------------------------------------------------------------------------------ Chemistries   Recent Labs Lab 06/16/16 1750 06/17/16 0755  NA 141  --   K 3.4*  --   CL 102  --   CO2 29  --   GLUCOSE 152*  --   BUN 10  --   CREATININE 0.59  --   CALCIUM 9.1  --   MG  --  2.1  AST 36  --   ALT 27  --   ALKPHOS 88  --   BILITOT 0.6  --    RADIOLOGY:  No results found. ASSESSMENT AND PLAN:   82 year old African American female history of essential hypertension who is presenting with weakness  1. Generalized weakness/falls: Likely multifactorial,  SNF per physical therapy evaluation. But pt is in observation status, she can only get HHPT due to insurance requirement per SW. It is not safe to discharge pt to home. I discussed with SW and CM.  2. Urinary tract infection site unspecified,  continue ceftriaxone and follow urine culture.  3. Essential hypertension: Metoprolol, Norvasc and Cozaar 4. GERD without esophagitis PPI therapy  * Hypokalemia. Given KCl po.  All the records are reviewed and case discussed with Care Management/Social Worker. Management plans discussed with the patient, family and they are in agreement.  CODE STATUS: full code.  TOTAL TIME TAKING CARE OF THIS PATIENT: 26 minutes.   More than 50% of the time was spent  in counseling/coordination of care: YES  POSSIBLE D/C IN 1-2 DAYS, DEPENDING ON CLINICAL CONDITION.   Demetrios Loll M.D on 06/18/2016 at 12:48 PM  Between 7am to 6pm - Pager - [phone]  After 6pm go to [email or web address] - Proofreader  [facility] [place] [facility]  Office  [phone]  CC: Primary care physician; [name]  Note: This dictation was prepared with Dragon dictation along with smaller phrase technology. Any transcriptional errors that result from this process are unintentional.

## 2016-06-18 NOTE — Progress Notes (Addendum)
2111 gave pt zofran for nausea and 2156, administered 15 ml of milk of magnesia. Offered patient sprite to take the taste of it away and stayed with pt while she finished it.

## 2016-06-18 NOTE — Care Management Note (Signed)
Case Management Note  Patient Details  Name: CYRILLA VANWYE MRN: [id number] Date of Birth: 1931/09/29  Subjective/Objective:      Discussed discharge planning with Dr Bridgett Larsson. Ms Kampman lives alone at [place]. Ms Raimondo is currently too weak to stand or even sit up on the side of her bed. She is currently in Observation status. No discharge planned for today. PT will continue to follow for strength and balance issues. Case management will continue to attempt to reach her relatives.              Action/Plan:   Expected Discharge Date:                  Expected Discharge Plan:     In-House Referral:     Discharge planning Services     Post Acute Care Choice:    Choice offered to:     DME Arranged:    DME Agency:     HH Arranged:    HH Agency:     Status of Service:     If discussed at [employer] of Stay Meetings, dates discussed:    Additional Comments:  Rockell Faulks A, RN 06/18/2016, 9:32 AM

## 2016-06-18 NOTE — Progress Notes (Signed)
Pt states that she takes 60 ml of milk of magnesium daily.  MD chen notified. Orders received and placed for 60 ml milk of magnesium.

## 2016-06-19 DIAGNOSIS — R531 Weakness: Secondary | ICD-10-CM | POA: Diagnosis not present

## 2016-06-19 LAB — CREATININE, SERUM
CREATININE: 0.62 mg/dL (ref 0.44–1.00)
GFR calc Af Amer: >60 mL/min (ref >60)

## 2016-06-19 LAB — CBC
HCT: 36.8 % (ref 35.0–47.0)
Hemoglobin: 12.6 g/dL (ref 12.0–16.0)
MCH: 28.6 pg (ref 26.0–34.0)
MCHC: 34.3 g/dL (ref 32.0–36.0)
MCV: 83.4 fL (ref 80.0–100.0)
PLATELETS: 242 10*3/uL (ref 150–440)
RBC: 4.41 MIL/uL (ref 3.80–5.20)
RDW: 16.4 % — ABNORMAL HIGH (ref 11.5–14.5)
WBC: 8.3 10*3/uL (ref 3.6–11.0)

## 2016-06-19 LAB — URINE CULTURE

## 2016-06-19 LAB — VITAMIN D 25 HYDROXY (VIT D DEFICIENCY, FRACTURES): Vit D, 25-Hydroxy: 29.3 ng/mL — ABNORMAL LOW (ref 30.0–100.0)

## 2016-06-19 MED ORDER — SODIUM CHLORIDE 0.9 % IV SOLN
1.0000 g | Freq: Four times a day (QID) | INTRAVENOUS | Status: DC
  Administered 2016-06-19 – 2016-06-21 (×8): 1 g via INTRAVENOUS
  Filled 2016-06-19 (×13): qty 1000

## 2016-06-19 MED ORDER — METOCLOPRAMIDE HCL 5 MG/5ML PO SOLN
10.0000 mg | Freq: Once | ORAL | Status: AC
  Administered 2016-06-19: 10 mg via ORAL
  Filled 2016-06-19: qty 10

## 2016-06-19 MED ORDER — DEXTROSE 5 % IV SOLN
1.0000 g | INTRAVENOUS | Status: DC
  Filled 2016-06-19: qty 10

## 2016-06-19 NOTE — Progress Notes (Signed)
[place] [facility] [place] NAME: Banner Irelan    MR#:  [id number]  DATE OF BIRTH:  February 25, 1931  SUBJECTIVE:  CHIEF COMPLAINT:   Chief Complaint  Patient presents with  . Fall   better headache and weakness. She vomited this am. REVIEW OF SYSTEMS:  Review of Systems  Constitutional: Positive for malaise/fatigue. Negative for fever and chills.  Respiratory: Negative for cough, shortness of breath and wheezing.   Cardiovascular: Negative for chest pain, palpitations, orthopnea and leg swelling.  Gastrointestinal: has nausea, vomiting, no abdominal pain or diarrhea.  Musculoskeletal: Negative for joint pain.  Neurological: Positive for weakness and headaches. Negative for dizziness, tingling, sensory change, speech change and focal weakness.  Psychiatric/Behavioral: Negative for depression. The patient is not nervous/anxious.     DRUG ALLERGIES:   Allergies  Allergen Reactions  . Citalopram Other (See Comments)    Reaction:  Altered mental status   . Cymbalta [Duloxetine Hcl] Other (See Comments)    Reaction:  Sedative for pt   . Imipramine Other (See Comments)    Reaction:  Unknown   . Proton Pump Inhibitors Other (See Comments)    Reaction:  Unknown   . Venlafaxine Nausea And Vomiting and Other (See Comments)    Reaction:  Dizziness    VITALS:  Blood pressure 116/68, pulse 106, temperature 98.8 F (37.1 C), temperature source Oral, resp. rate 18, height 5\' 4"  (1.626 m), weight 273 lb 11.2 oz (124.15 kg), SpO2 100 %. PHYSICAL EXAMINATION:  Physical Exam  Constitutional: She is oriented to person, place, and time and well-developed, well-nourished, and in no distress.  morbid obese. HENT:  Head: Normocephalic.  Eyes: Conjunctivae and EOM are normal. Pupils are equal, round, and reactive to light.  Neck: No JVD present. No thyromegaly present.  Cardiovascular: Normal rate, regular rhythm and normal heart sounds.  Exam reveals no  gallop and no friction rub.   No murmur heard. Pulmonary/Chest: No stridor. No respiratory distress. She has no wheezes. She has no rales. She exhibits no tenderness.  Abdominal: She exhibits no distension. There is no tenderness. There is no rebound.  Musculoskeletal: She exhibits no edema.  Neurological: She is alert and oriented to person, place, and time. She has normal reflexes. power 3/5 in extremities. Skin: No rash noted. No erythema.  Psychiatric: Mood, memory, affect and judgment normal.   LABORATORY PANEL:   CBC  Recent Labs Lab 06/19/16 0448  WBC 8.3  HGB 12.6  HCT 36.8  PLT 242   ------------------------------------------------------------------------------------------------------------------ Chemistries   Recent Labs Lab 06/16/16 1750 06/17/16 0755 06/19/16 0448  NA 141  --   --   K 3.4*  --   --   CL 102  --   --   CO2 29  --   --   GLUCOSE 152*  --   --   BUN 10  --   --   CREATININE 0.59  --  0.62  CALCIUM 9.1  --   --   MG  --  2.1  --   AST 36  --   --   ALT 27  --   --   ALKPHOS 88  --   --   BILITOT 0.6  --   --    RADIOLOGY:  No results found. ASSESSMENT AND PLAN:   82 year old African American female history of essential hypertension who is presenting with weakness  1. Generalized weakness/falls: Likely multifactorial,  SNF per physical therapy  evaluation. But pt is in observation status, she can only get HHPT due to insurance requirement per SW. It is not safe to discharge pt to home. I discussed with SW and CM.  2. Urinary tract infection site unspecified,  discontinue ceftriaxone, change to ampicillin since urine culture: ENTEROCOCCUS SPECIES sensitive to ampicillin.  3. Essential hypertension: Metoprolol, Norvasc and Cozaar 4. GERD without esophagitis PPI therapy  * Hypokalemia. Given KCl po.  All the records are reviewed and case discussed with Care Management/Social Worker. Management plans discussed with the patient, family  and they are in agreement.  CODE STATUS: full code.  TOTAL TIME TAKING CARE OF THIS PATIENT: 28 minutes.   More than 50% of the time was spent in counseling/coordination of care: YES  POSSIBLE D/C IN 1-2 DAYS, DEPENDING ON CLINICAL CONDITION.   Demetrios Loll M.D on 06/19/2016 at 5:19 PM  Between 7am to 6pm - Pager - [phone]  After 6pm go to [email or web address] - Proofreader  [facility]  Office  [phone]  CC: Primary care physician; [name]  Note: This dictation was prepared with Dragon dictation along with smaller phrase technology. Any transcriptional errors that result from this process are unintentional.

## 2016-06-19 NOTE — Progress Notes (Signed)
   06/19/16 1100  Clinical Encounter Type  Visited With Patient  Visit Type Initial  Consult/Referral To Chaplain  Spiritual Encounters  Spiritual Needs Prayer  Pt was in good spirits and optimistic concerning her health treatment.

## 2016-06-19 NOTE — Care Management Note (Signed)
Case Management Note  Patient Details  Name: Michele Schultz MRN: [id number] Date of Birth: 07/18/1931  Subjective/Objective:     Spoke with patient who is alert and oriented from independent living facility [facility]. Patient is currently under observation status here but is unable to ambulate and not able to discharge safely . Patient stated that she only uses a rollator walker at her residence but has been falling constantly at home. She stated that she is mainly Bed bound now because she is weak and  afraid to get up.  PT has recommended SNF placement.  Patient has a bedside commode and a shower chair and also has [place] arranged 2 times weekly but she stated she cannot recall the agency. Pharmacy is [facility] and PCP is [facility].. She stated that she thinks she has mMEdicaid beneifts and stated that I should call [name] (her god daughter ) to get details. [phone]. Attempted to call this number but voicemail full and could not leave message. Will continue to try.  Action/Plan: Continue to follow.    Expected Discharge Date:                  Expected Discharge Plan:     In-House Referral:     Discharge planning Services  CM Consult  Post Acute Care Choice:    Choice offered to:     DME Arranged:    DME Agency:     HH Arranged:  RN [place] Agency:     Status of Service:  In process, will continue to follow  If discussed at Long Length of Stay Meetings, dates discussed:    Additional Comments:  Alvie Heidelberg, RN 06/19/2016, 3:25 PM

## 2016-06-19 NOTE — Care Management (Signed)
Contacted Michele Schultz patient niece who stated that patient had been applying for Medicaid but she was unsure of the status. She will attempt to call Ivan Croft the god daughter who is the one helping with this and have her return my call. Discussed case with Larene Beach at [facility] as well. More to follow.

## 2016-06-19 NOTE — Progress Notes (Signed)
Pt vomiting and complaining of nausea. MD Bridgett Larsson notified.  Orders received for Reglan 10 mg X1. Orders placed.

## 2016-06-19 NOTE — Discharge Planning (Signed)
Patient is active with [facility].  She was receiving skilled nursing for disease management and physical therapy for weakness.  Please reach out to Corliss Blacker RN [phone]) for discharge assistance.

## 2016-06-20 DIAGNOSIS — R531 Weakness: Secondary | ICD-10-CM | POA: Diagnosis not present

## 2016-06-20 NOTE — Discharge Instructions (Signed)
You were evaluated after falls and no serious injury is suspected. You're being treated for urinary tract infection with antibiotic called Keflex. A culture has been sent and he will be called if you need a change in antibiotic.  In terms of your diarrhea, if you develop a fever, bloody stool, or persistent diarrhea over the next few days, return to the emergency room for further evaluation. I am recommending he felt a primary care doctor next week.  In terms of the nausea and vomiting, this seems her consistent with your prior issues with gastroparesis. Follow-up with the primary care doctor.   Urinary Tract Infection A urinary tract infection (UTI) can occur any place along the urinary tract. The tract includes the kidneys, ureters, bladder, and urethra. A type of germ called bacteria often causes a UTI. UTIs are often helped with antibiotic medicine.  HOME CARE   If given, take antibiotics as told by your doctor. Finish them even if you start to feel better.  Drink enough fluids to keep your pee (urine) clear or pale yellow.  Avoid tea, drinks with caffeine, and bubbly (carbonated) drinks.  Pee often. Avoid holding your pee in for a long time.  Pee before and after having sex (intercourse).  Wipe from front to back after you poop (bowel movement) if you are a woman. Use each tissue only once. GET HELP RIGHT AWAY IF:   You have back pain.  You have lower belly (abdominal) pain.  You have chills.  You feel sick to your stomach (nauseous).  You throw up (vomit).  Your burning or discomfort with peeing does not go away.  You have a fever.  Your symptoms are not better in 3 days. MAKE SURE YOU:   Understand these instructions.  Will watch your condition.  Will get help right away if you are not doing well or get worse.   This information is not intended to replace advice given to you by your health care provider. Make sure you discuss any questions you have with your  health care provider.   Document Released: 05/08/2008 Document Revised: 12/11/2014 Document Reviewed: 06/20/2012 Elsevier Interactive Patient Education 2016 [employer].  heart healthy diet. Activity as tolerated. HHPT. Fall precaution.

## 2016-06-20 NOTE — Discharge Summary (Signed)
[facility] [place] [facility] [place] NAME: Michele Schultz    MR#:  [id number]  DATE OF BIRTH:  [birth year]  DATE OF ADMISSION:  06/16/2016 ADMITTING PHYSICIAN: Lytle Butte, MD  DATE OF DISCHARGE: 06/20/2016 PRIMARY CARE PHYSICIAN: [name]    ADMISSION DIAGNOSIS:  UTI (lower urinary tract infection) [N39.0] Generalized weakness [R53.1] Fall, initial encounter [W19.XXXA]  DISCHARGE DIAGNOSIS:  Active Problems:   Generalized weakness UTI.  SECONDARY DIAGNOSIS:   Past Medical History  Diagnosis Date  . Hypertensive heart disease   . Anemia   . Neuralgia, neuritis, and radiculitis, unspecified   . Coronary artery disease     a. s/p 2v CABG 11/12 (VG-LAD, VG-OM1); b. Lexiscan 08/2015: low risk, no ischemia, EF 55-65%.  . Aortic stenosis, severe     a. s/p Magna Ease pericardial tissue valve size 21 mm replacement in 10/2011 for severe AS 11/12; b. echo 07/2015: EF 55-60% mod concentric LVH, GR1DD, LA mildly dilated, PASP 45 mm Hg  . Onychia and paronychia of toe   . Osteoarthrosis, unspecified whether generalized or localized, pelvic region and thigh     mainly in her back and knees  . Enthesopathy of hip region   . Esophageal reflux     followed by Dr.Seigal. stabilized with a combination of Nexium and Zantac  . Knee joint replacement by other means   . Stress incontinence, female     followed by Dr.Cope  . Complete heart block [facility]     a. s/p St Jude PPM 10/2011 (Ser # [id number]).  . Cardiac pacemaker -st Judes     11/12  . Chronic diastolic heart failure ([place])     a. echo 2014: EF 55-60%, no RWMA, GR1DD, PASP 47 mm Hg; b. echo 07/2015: EF 55-60% mod concentric LVH, GR1DD, LA mildly dilated, PASP 45 mm Hg  . Type II or unspecified type diabetes mellitus without mention of complication, not stated as uncontrolled     a. pt. reports that she is borderline   . Spinal stenosis   . Neuropathy ([place])   . Morbid obesity ([place])     . RAD (reactive airway disease)     a. chronic SOB  . Fibromyalgia   . HLD (hyperlipidemia)   . PAF (paroxysmal atrial fibrillation) ([place])     a. brief episodes of AF previously noted on device interrogations.  . Wide-complex tachycardia ([place])     a. Noted 4/10 - brief episode in ED. Noted again 4/21 in clinic->felt most likely to be atrial tach.    HOSPITAL COURSE:  82 year old African American female history of essential hypertension who is presenting with weakness  1. Generalized weakness/falls: Likely multifactorial,  SNF per physical therapy evaluation. But pt is in observation status, she can only get HHPT due to insurance requirement per SW and CM.  2. Urinary tract infection site unspecified, discontinued ceftriaxone, changed to ampicillin since urine culture: ENTEROCOCCUS SPECIES sensitive to ampicillin.  3. Essential hypertension: Metoprolol, Norvasc and Cozaar 4. GERD without esophagitis PPI therapy  * Hypokalemia. Given KCl po.  DISCHARGE CONDITIONS:   Stable, discharge to ALF with HHPT, RN and Nurse aid today.  CONSULTS OBTAINED:  Treatment Team:  Lytle Butte, MD  DRUG ALLERGIES:   Allergies  Allergen Reactions  . Citalopram Other (See Comments)    Reaction:  Altered mental status   . Cymbalta [Duloxetine Hcl] Other (See Comments)    Reaction:  Sedative for pt   .  Imipramine Other (See Comments)    Reaction:  Unknown   . Proton Pump Inhibitors Other (See Comments)    Reaction:  Unknown   . Venlafaxine Nausea And Vomiting and Other (See Comments)    Reaction:  Dizziness     DISCHARGE MEDICATIONS:   Current Discharge Medication List    START taking these medications   Details  cephALEXin (KEFLEX) 500 MG capsule Take 1 capsule (500 mg total) by mouth 2 (two) times daily. Qty: 14 capsule, Refills: 0      CONTINUE these medications which have NOT CHANGED   Details  albuterol-ipratropium (COMBIVENT) 18-103 MCG/ACT inhaler Inhale 2 puffs into the  lungs every 6 (six) hours as needed for wheezing. Qty: 1 Inhaler, Refills: 4    amLODipine (NORVASC) 5 MG tablet Take 1 tablet (5 mg total) by mouth daily. Qty: 30 tablet, Refills: 6    aspirin EC 81 MG tablet Take 81 mg by mouth daily.    azelastine (ASTELIN) 0.1 % nasal spray Place 1 spray into both nostrils as needed for rhinitis.     benzonatate (TESSALON) 100 MG capsule Take 200 mg by mouth 3 (three) times daily as needed for cough.     cyanocobalamin (,VITAMIN B-12,) 1000 MCG/ML injection Inject 1,000 mcg into the muscle every 30 (thirty) days.    cycloSPORINE (RESTASIS) 0.05 % ophthalmic emulsion Place 1 drop into both eyes 2 (two) times daily.     furosemide (LASIX) 20 MG tablet Take 1 tablet (20 mg total) by mouth every other day. Qty: 30 tablet, Refills: 6    gabapentin (NEURONTIN) 300 MG capsule Take 1 capsule (300 mg total) by mouth 2 (two) times daily. Qty: 60 capsule, Refills: 2    HYDROcodone-acetaminophen (NORCO) 10-325 MG tablet Take 1 tablet by mouth every 4 (four) hours as needed for moderate pain. Reported on 05/30/2016    losartan (COZAAR) 25 MG tablet Take 25 mg by mouth daily.    magnesium hydroxide (MILK OF MAGNESIA) 400 MG/5ML suspension Take 15 mLs by mouth daily as needed for mild constipation.     metoCLOPramide (REGLAN) 10 MG tablet Take 1 tablet (10 mg total) by mouth every 8 (eight) hours as needed for nausea or vomiting. Qty: 30 tablet, Refills: 1    metoprolol succinate (TOPROL-XL) 50 MG 24 hr tablet Take 1 tablet (50 mg total) by mouth daily. Qty: 30 tablet, Refills: 2    nitroGLYCERIN (NITROSTAT) 0.4 MG SL tablet Place 1 tablet (0.4 mg total) under the tongue every 5 (five) minutes as needed for chest pain. Qty: 90 tablet, Refills: 3    pantoprazole (PROTONIX) 40 MG tablet Take 40 mg by mouth 2 (two) times daily.      phenazopyridine (PYRIDIUM) 200 MG tablet Take 1 tablet (200 mg total) by mouth 3 (three) times daily as needed for pain. Qty:  20 tablet, Refills: 0    potassium chloride (K-DUR) 10 MEQ tablet Take 10 mEq by mouth daily.    promethazine (PHENERGAN) 25 MG tablet Take 1 tablet (25 mg total) by mouth every 6 (six) hours as needed for nausea or vomiting. Qty: 30 tablet, Refills: 0    traMADol (ULTRAM) 50 MG tablet Take 1 tablet (50 mg total) by mouth every 6 (six) hours as needed. Qty: 20 tablet, Refills: 0         DISCHARGE INSTRUCTIONS:    DIET:  Heart healthy diet.  DISCHARGE CONDITION:  Stable.  ACTIVITY:  As tolerated.  DISCHARGE LOCATION:   If  you experience worsening of your admission symptoms, develop shortness of breath, life threatening emergency, suicidal or homicidal thoughts you must seek medical attention immediately by calling 911 or calling your MD immediately  if symptoms less severe.  You Must read complete instructions/literature along with all the possible adverse reactions/side effects for all the Medicines you take and that have been prescribed to you. Take any new Medicines after you have completely understood and accpet all the possible adverse reactions/side effects.   Please note  You were cared for by a hospitalist during your hospital stay. If you have any questions about your discharge medications or the care you received while you were in the hospital after you are discharged, you can call the unit and asked to speak with the hospitalist on call if the hospitalist that took care of you is not available. Once you are discharged, your primary care physician will handle any further medical issues. Please note that NO REFILLS for any discharge medications will be authorized once you are discharged, as it is imperative that you return to your primary care physician (or establish a relationship with a primary care physician if you do not have one) for your aftercare needs so that they can reassess your need for medications and monitor your lab values.    On the day of Discharge:    VITAL SIGNS:  Blood pressure 108/49, pulse 88, temperature 98.2 F (36.8 C), temperature source Oral, resp. rate 18, height 5\' 4"  (1.626 m), weight 273 lb 11.2 oz (124.15 kg), SpO2 100 %.  PHYSICAL EXAMINATION:  GENERAL:  82 y.o.-year-old patient lying in the bed with no acute distress. Morbid obese. EYES: Pupils equal, round, reactive to light and accommodation. No scleral icterus. Extraocular muscles intact.  HEENT: Head atraumatic, normocephalic. Oropharynx and nasopharynx clear.  NECK:  Supple, no jugular venous distention. No thyroid enlargement, no tenderness.  LUNGS: Normal breath sounds bilaterally, no wheezing, rales,rhonchi or crepitation. No use of accessory muscles of respiration.  CARDIOVASCULAR: S1, S2 normal. No murmurs, rubs, or gallops.  ABDOMEN: Soft, non-tender, non-distended. Bowel sounds present. No organomegaly or mass.  EXTREMITIES: No pedal edema, cyanosis, or clubbing.  NEUROLOGIC: Cranial nerves II through XII are intact. Muscle strength 2-3/5 in all extremities. Sensation intact. Gait not checked.  PSYCHIATRIC: The patient is alert and oriented x 3.  SKIN: No obvious rash, lesion, or ulcer.  DATA REVIEW:   CBC  Recent Labs Lab 06/19/16 0448  WBC 8.3  HGB 12.6  HCT 36.8  PLT 242    Chemistries   Recent Labs Lab 06/16/16 1750 06/17/16 0755 06/19/16 0448  NA 141  --   --   K 3.4*  --   --   CL 102  --   --   CO2 29  --   --   GLUCOSE 152*  --   --   BUN 10  --   --   CREATININE 0.59  --  0.62  CALCIUM 9.1  --   --   MG  --  2.1  --   AST 36  --   --   ALT 27  --   --   ALKPHOS 88  --   --   BILITOT 0.6  --   --     Cardiac Enzymes  Recent Labs Lab 06/16/16 1750  TROPONINI <0.03    Microbiology Results  Results for orders placed or performed during the hospital encounter of 06/16/16  Urine culture  Status: Abnormal   Collection Time: 06/16/16  5:45 PM  Result Value Ref Range Status   Specimen Description URINE, RANDOM  Final    Special Requests NONE  Final   Culture >=100,000 COLONIES/mL ENTEROCOCCUS SPECIES (A)  Final   Report Status 06/19/2016 FINAL  Final   Organism ID, Bacteria ENTEROCOCCUS SPECIES (A)  Final      Susceptibility   Enterococcus species - MIC*    AMPICILLIN <=2 SENSITIVE Sensitive     LEVOFLOXACIN >=8 RESISTANT Resistant     NITROFURANTOIN <=16 SENSITIVE Sensitive     VANCOMYCIN <=0.5 SENSITIVE Sensitive     * >=100,000 COLONIES/mL ENTEROCOCCUS SPECIES  MRSA PCR Screening     Status: None   Collection Time: 06/16/16 10:05 PM  Result Value Ref Range Status   MRSA by PCR NEGATIVE NEGATIVE Final    Comment:        The GeneXpert MRSA Assay (FDA approved for NASAL specimens only), is one component of a comprehensive MRSA colonization surveillance program. It is not intended to diagnose MRSA infection nor to guide or monitor treatment for MRSA infections.     RADIOLOGY:  No results found.   Management plans discussed with the patient, family and they are in agreement.  CODE STATUS:     Code Status Orders        Start     Ordered   06/16/16 2036  Full code   Continuous     06/16/16 2035    Code Status History    Date Active Date Inactive Code Status Order ID Comments User Context   03/13/2016  8:15 PM 03/14/2016  9:02 PM Full Code [id number]  Henreitta Leber, MD Inpatient   09/13/2015  6:51 PM 09/18/2015  7:31 PM Full Code [id number]  Dustin Flock, MD Inpatient   10/20/2011  1:40 PM 10/22/2011  1:27 PM Full Code [id number]  Tiajuana Amass, RN Inpatient    Advance Directive Documentation        Most Recent Value   Type of Advance Directive  Living will   Pre-existing out of facility DNR order (yellow form or pink MOST form)     "MOST" Form in Place?        TOTAL TIME TAKING CARE OF THIS PATIENT: 42 minutes.    Demetrios Loll M.D on 06/20/2016 at 2:36 PM  Between 7am to 6pm - Pager - [phone]  After 6pm go to [email or web address] - password [email or web address]  Office  [phone]  CC: Primary care physician; [name]   Note: This dictation was prepared with Dragon dictation along with smaller phrase technology. Any transcriptional errors that result from this process are unintentional.

## 2016-06-20 NOTE — Progress Notes (Signed)
Per Vance Gather Medicaid worker at [facility] patient's [facility] application has been denied. Per Clinical Social Work (CSW) Mudlogger patient is not eligible for an LOG. Patient will D/C home with home health today. CSW contacted patient's family member Kennyth Lose and made her aware of above. RN Case Manager is aware of above and arranging home health. Please reconsult if future social work needs arise. CSW signing off.   [employer], LCSW [phone]

## 2016-06-20 NOTE — Care Management (Addendum)
Patient will discharge home to [facility] today independent living. Discussed discharge plan with patient who became tearful and stated that she cannot take care of herself, discussed self pay for SNF placement and patient stated she would not be able to pay for this.   She stated that [facility] takes all of her social security benefit except for $100.  Patient stated that she could not pay for a [facility]. Explained again to patient that she is clincially stable for discharge and that it was not medically necessary for her to remain in hospital. Explained the plan for continued [place].   Patient will have home health resumed with [facility] to whom she was open with prior to admission. PT, RN, CSW. And Nurse Aid. Corliss Blacker notified of patient discharge today via telephone conversation. Patient will need transport via EMS.

## 2016-06-20 NOTE — Progress Notes (Signed)
Physical Therapy Treatment Patient Details Name: Michele Schultz MRN: [id number] DOB: 24-Apr-1931 Today's Date: 06/20/2016    History of Present Illness Michele Schultz is an 82yo black female who comes to [facility] on 7/14 following 3 days of progressive weakness and multiple falls at home. She also reports that during one of her falls earlier this week, she hit her forehead on her bedpost and has had HA and vertigo since. She reports that she falls every day due to legs giving out, which is consistent with her report in April of 2017 at that admission. She resides at [facility] where she has in home aide 2d/W for 2 hours each time. At baseline, she is quite sedentatry, walks only from bed to BR for voiding, otherwise performs all mobility with dependently in [facility] for appointments. PMH: FM, RA, HTN, CAD s/p CABG, B THA, B TKA, aortic stenosis, and s/p bovine aortic valve replacement.      PT Comments    Pt refuses initial attempt at PT due to nausea/vomitting. Second attempt, pt agreeable to PT. Pt requires heavy Mod A, cues and heavy use of bed rails to perform supine to sit and maintain upright sitting posture. Ultimately able to attain/maintain with feet supported and use of rail. 2+ Mod assist required for stand transfer; pt unable to maintain upright posture in stand noting "room spins". Blood pressure checked in seated 141/81 and stand 141/67 requiring increased assist to maintain stand for reading. Pt is unable to ambulate at all. Pt requires increased assist returning to bed and max of 2 to reposition in bed. Pt is aware of discharge order today and notes she will refuse discharge. Spoke with nursing, CM and SW regarding session and recommendations for discharge. Recommending bariatric rolling walker, as pt only has rollator, which is unsafe considering her numerous falls at home.   Follow Up Recommendations  SNF     Equipment Recommendations  Rolling walker with 5" wheels (bariatric; pt only  has rollator, which is unsafe)    Recommendations for Other Services       Precautions / Restrictions Precautions Precautions: Fall    Mobility  Bed Mobility Overal bed mobility: Needs Assistance Bed Mobility: Supine to Sit;Sit to Supine     Supine to sit: Mod assist Sit to supine: Mod assist;Max assist;+2 for physical assistance (max for repositioning upward in bed)   General bed mobility comments: Pt with Mod A and increased time/effort; takes several attempts and effort to attain and maintain full upright sitting. Falls posterolateral to the R several times  Transfers Overall transfer level: Needs assistance Equipment used: Rolling walker (2 wheeled) (bariatric) Transfers: Sit to/from Stand Sit to Stand: Mod assist;+2 physical assistance         General transfer comment: STS performed 4x; pt challenged to maintain full upright stand with c/o severe dizziness with room spinning. Uncontrolled descent to bed requiring Max A of 2  Ambulation/Gait             General Gait Details: unable    Stairs            Wheelchair Mobility    Modified Rankin (Stroke Patients Only)       Balance Overall balance assessment: History of Falls                                  Cognition Arousal/Alertness: Awake/alert (fatigued) Behavior During Therapy: [facility] for tasks  assessed/performed Overall Cognitive Status: Within Functional Limits for tasks assessed                      Exercises      General Comments        Pertinent Vitals/Pain Pain Assessment: No/denies pain    Home Living                      Prior Function            PT Goals (current goals can now be found in the care plan section) Progress towards PT goals: Not progressing toward goals - comment    Frequency  Min 2X/week    PT Plan Current plan remains appropriate    Co-evaluation             End of Session Equipment Utilized During Treatment: Gait  belt Activity Tolerance: Other (comment) (limited by dizziness and nausea.) Patient left: in bed;with call bell/phone within reach;with bed alarm set     Time: [id number] PT Time Calculation (min) (ACUTE ONLY): 27 min  Charges:  $Therapeutic Activity: 23-37 mins                    G CodesCharlaine Dalton, PTA 06/20/2016, 4:33 PM

## 2016-06-20 NOTE — Care Management (Addendum)
Floor CM had already left for the day. This RNCM was notified by PT-A that patient is not safe to return to independent living as she could not stand on her own. I met with patient and she became tearful about discharging back to home. She said she lay in the floor for 2 hours at [facility] and when someone finally came to her they told her "they could not touch her because she was in independent living". She said she is usually able to do more than what she can do right now. She states that she is usually able to live independently. She states that "she lost her husband a few months ago and that along with costs for [facility] has consumed all of her money".  She states that she has nieces and nephews but they "have their own families to care for". She states that [facility] and Sheila [facility] are her "HCPOA" however I spoke with Sheila and she said they are POA's not HCPOAs's. Sheila said that she has medical issues and is limited to what she can do however she has asked that she be updated on patient need. I spoke with Andrew [phone] at [facility] and informed him of my concern and what patient told me- he acknowledged. He said that patient cannot return to their facility without assistance and that they cannot provide financial assistance to patient for private duty care. I called Sheilia Knight back and updated. I asked Sheilia to contact Don [phone] at [facility] and patient to discuss financial obligations and to get patient assistance in the home for today's discharge- she will try. I again spoke to patient and explained that adult protective services may need to be notified to help meet her needs. Sheila called this RNCM back stating that she had spoke to Don at [facility] and they can make arrangements for in home care (private duty) tomorrow as it is after hours. I have paged Dr. Chen due to safety concerns and [facility] not accepting patient back until 24/7 care is established.  Patient, Sheila and Andrew have been notified that patient's Medicare may not cover charges for this visit. This RNCM has requested discharge to be stopped. Family/patient aware that patient will need to have home arrangements in place by tomorrow.  

## 2016-06-21 DIAGNOSIS — R531 Weakness: Secondary | ICD-10-CM | POA: Diagnosis not present

## 2016-06-21 NOTE — Care Management (Addendum)
I have updated Dr. Doy Hutching Physician advisor regarding patient delay in discharge yesterday. Patient for discharge today.  I spoke with Timmothy Sours [phone] at [facility] and he basically said he told [name] yesterday that they could not assist with anything medical for residents and that family would need to make arrangements. I contacted [name] this morning and she is waiting on a callback from two providers she has contacted regarding home sitter arrangements. She said she would like me know by noon today of that status. She is aware that patient has discharge order again. EMS packet completed. [facility] aware. APS has not been contacted per [facility] request but she is aware that that may be an option for her if she need further assistance with patient. [name] and Don aware that patient agrees to a higher level of care and per patient "they have been working on it for 6 months". I encouraged them to make it a priority to find patient a higher level of care; Timmothy Sours said he could not assist.

## 2016-06-21 NOTE — Care Management (Addendum)
I have again talked to patient's niece Michele Schultz in the presence of the patient. Patient does not want to return to [facility] but I explained that she will not be able to stay at [facility]/hospital as there is no medical necessity to continue treatment here. I have notified patient and Michele Schultz that [facility] has been notified of her discharge and Michele Schultz has agreed to assist patient with personal care services along with assisted living. I have delivered information on PACE services, personal care providers to negotiate costs, and sheilia is checking with a church family member. RN notified that EMS can be called to take patient back to [facility] today; Packet completed. Adult protective services [phone] contact number has been busy for quite some time now. Michele Schultz and patient are aware of this potential option also. This RNCM will attempt to reach APS to see if they can provide assistance to patient. Patient does not need to stay at hospital for me to do this. Michele Schultz and patient are aware of discharge today and the need to seek higher level of care and that a social worker will be sent to the home with [facility]. No further RNCM needs.   [facility] [phone]  referral made to see if they can assist with patient needs. Hoyer lift not ordered because no one in the home to manage it and patient apartment has limited space for store this size equipment. HHPT to assess.

## 2016-06-21 NOTE — Discharge Summary (Signed)
Michele Schultz at [place] NAME: Michele Schultz    MR#:  [id number]  DATE OF BIRTH:  1931/11/30  DATE OF ADMISSION:  06/16/2016 ADMITTING PHYSICIAN: Michele Butte, MD  DATE OF DISCHARGE: 06/21/2016 PRIMARY CARE PHYSICIAN: Michele Schultz    ADMISSION DIAGNOSIS:  UTI (lower urinary tract infection) [N39.0] Generalized weakness [R53.1] Fall, initial encounter [W19.XXXA]  DISCHARGE DIAGNOSIS:  Active Problems:   Generalized weakness UTI  SECONDARY DIAGNOSIS:   Past Medical History  Diagnosis Date  . Hypertensive heart disease   . Anemia   . Neuralgia, neuritis, and radiculitis, unspecified   . Coronary artery disease     a. s/p 2v CABG 11/12 (VG-LAD, VG-OM1); b. Lexiscan 08/2015: low risk, no ischemia, EF 55-65%.  . Aortic stenosis, severe     a. s/p Magna Ease pericardial tissue valve size 21 mm replacement in 10/2011 for severe AS 11/12; b. echo 07/2015: EF 55-60% mod concentric LVH, GR1DD, LA mildly dilated, PASP 45 mm Hg  . Onychia and paronychia of toe   . Osteoarthrosis, unspecified whether generalized or localized, pelvic region and thigh     mainly in her back and knees  . Enthesopathy of hip region   . Esophageal reflux     followed by Dr.Seigal. stabilized with a combination of Nexium and Zantac  . Knee joint replacement by other means   . Stress incontinence, female     followed by Dr.Cope  . Complete heart block [facility])     a. s/p St Jude PPM 10/2011 (Ser # [id number]).  . Cardiac pacemaker -st Judes     11/12  . Chronic diastolic heart failure (Michele Schultz)     a. echo 2014: EF 55-60%, no RWMA, GR1DD, PASP 47 mm Hg; b. echo 07/2015: EF 55-60% mod concentric LVH, GR1DD, LA mildly dilated, PASP 45 mm Hg  . Type II or unspecified type diabetes mellitus without mention of complication, not stated as uncontrolled     a. pt. reports that she is borderline   . Spinal stenosis   . Neuropathy (Michele Schultz)   . Morbid obesity (Michele Schultz)     . RAD (reactive airway disease)     a. chronic SOB  . Fibromyalgia   . HLD (hyperlipidemia)   . PAF (paroxysmal atrial fibrillation) (Michele Schultz)     a. brief episodes of AF previously noted on device interrogations.  . Wide-complex tachycardia (Michele Schultz)     a. Noted 4/10 - brief episode in ED. Noted again 4/21 in clinic->felt most likely to be atrial tach.    HOSPITAL COURSE:  82 year old African American female history of essential hypertension who is presenting with weakness  1. Generalized weakness/falls: Likely multifactorial,  SNF per physical therapy evaluation. But pt is in observation status, she can only get HHPT due to insurance requirement per SW and CM.  2. Urinary tract infection site unspecified, discontinued ceftriaxone, changed to ampicillin since urine culture: ENTEROCOCCUS SPECIES sensitive to ampicillin.  3. Essential hypertension: Metoprolol, Norvasc and Cozaar 4. GERD without esophagitis PPI therapy  * Hypokalemia. Given KCl po.  DISCHARGE CONDITIONS:   Stable, discharge to ALF with HHPT, RN and Nurse aid today.  CONSULTS OBTAINED:  Treatment Team:  Michele Butte, MD  DRUG ALLERGIES:   Allergies  Allergen Reactions  . Citalopram Other (See Comments)    Reaction:  Altered mental status   . Cymbalta [Duloxetine Hcl] Other (See Comments)    Reaction:  Sedative for pt   .  Imipramine Other (See Comments)    Reaction:  Unknown   . Proton Pump Inhibitors Other (See Comments)    Reaction:  Unknown   . Venlafaxine Nausea And Vomiting and Other (See Comments)    Reaction:  Dizziness     DISCHARGE MEDICATIONS:   Current Discharge Medication List    CONTINUE these medications which have NOT CHANGED   Details  albuterol-ipratropium (COMBIVENT) 18-103 MCG/ACT inhaler Inhale 2 puffs into the lungs every 6 (six) hours as needed for wheezing. Qty: 1 Inhaler, Refills: 4    amLODipine (NORVASC) 5 MG tablet Take 1 tablet (5 mg total) by mouth daily. Qty: 30 tablet,  Refills: 6    aspirin EC 81 MG tablet Take 81 mg by mouth daily.    azelastine (ASTELIN) 0.1 % nasal spray Place 1 spray into both nostrils as needed for rhinitis.     benzonatate (TESSALON) 100 MG capsule Take 200 mg by mouth 3 (three) times daily as needed for cough.     cyanocobalamin (,VITAMIN B-12,) 1000 MCG/ML injection Inject 1,000 mcg into the muscle every 30 (thirty) days.    cycloSPORINE (RESTASIS) 0.05 % ophthalmic emulsion Place 1 drop into both eyes 2 (two) times daily.     furosemide (LASIX) 20 MG tablet Take 1 tablet (20 mg total) by mouth every other day. Qty: 30 tablet, Refills: 6    gabapentin (NEURONTIN) 300 MG capsule Take 1 capsule (300 mg total) by mouth 2 (two) times daily. Qty: 60 capsule, Refills: 2    HYDROcodone-acetaminophen (NORCO) 10-325 MG tablet Take 1 tablet by mouth every 4 (four) hours as needed for moderate pain. Reported on 05/30/2016    losartan (COZAAR) 25 MG tablet Take 25 mg by mouth daily.    magnesium hydroxide (MILK OF MAGNESIA) 400 MG/5ML suspension Take 15 mLs by mouth daily as needed for mild constipation.     metoCLOPramide (REGLAN) 10 MG tablet Take 1 tablet (10 mg total) by mouth every 8 (eight) hours as needed for nausea or vomiting. Qty: 30 tablet, Refills: 1    metoprolol succinate (TOPROL-XL) 50 MG 24 hr tablet Take 1 tablet (50 mg total) by mouth daily. Qty: 30 tablet, Refills: 2    nitroGLYCERIN (NITROSTAT) 0.4 MG SL tablet Place 1 tablet (0.4 mg total) under the tongue every 5 (five) minutes as needed for chest pain. Qty: 90 tablet, Refills: 3    pantoprazole (PROTONIX) 40 MG tablet Take 40 mg by mouth 2 (two) times daily.      phenazopyridine (PYRIDIUM) 200 MG tablet Take 1 tablet (200 mg total) by mouth 3 (three) times daily as needed for pain. Qty: 20 tablet, Refills: 0    potassium chloride (K-DUR) 10 MEQ tablet Take 10 mEq by mouth daily.    promethazine (PHENERGAN) 25 MG tablet Take 1 tablet (25 mg total) by mouth  every 6 (six) hours as needed for nausea or vomiting. Qty: 30 tablet, Refills: 0    traMADol (ULTRAM) 50 MG tablet Take 1 tablet (50 mg total) by mouth every 6 (six) hours as needed. Qty: 20 tablet, Refills: 0         DISCHARGE INSTRUCTIONS:    DIET:  Heart healthy diet.  DISCHARGE CONDITION:  Stable.  ACTIVITY:  As tolerated,  DISCHARGE LOCATION:   If you experience worsening of your admission symptoms, develop shortness of breath, life threatening emergency, suicidal or homicidal thoughts you must seek medical attention immediately by calling 911 or calling your MD immediately  if symptoms  less severe.  You Must read complete instructions/literature along with all the possible adverse reactions/side effects for all the Medicines you take and that have been prescribed to you. Take any new Medicines after you have completely understood and accpet all the possible adverse reactions/side effects.   Please note  You were cared for by a hospitalist during your hospital stay. If you have any questions about your discharge medications or the care you received while you were in the hospital after you are discharged, you can call the unit and asked to speak with the hospitalist on call if the hospitalist that took care of you is not available. Once you are discharged, your primary care physician will handle any further medical issues. Please note that NO REFILLS for any discharge medications will be authorized once you are discharged, as it is imperative that you return to your primary care physician (or establish a relationship with a primary care physician if you do not have one) for your aftercare needs so that they can reassess your need for medications and monitor your lab values.    On the day of Discharge:  Chronic dizziness. VITAL SIGNS:  Blood pressure 114/55, pulse 106, temperature 98.4 F (36.9 C), temperature source Oral, resp. rate 18, height 5\' 4"  (1.626 m), weight 273 lb  11.2 oz (124.15 kg), SpO2 100 %.  PHYSICAL EXAMINATION:  GENERAL:  82 y.o.-year-old patient lying in the bed with no acute distress. Morbid obese. EYES: Pupils equal, round, reactive to light and accommodation. No scleral icterus. Extraocular muscles intact.  HEENT: Head atraumatic, normocephalic. Oropharynx and nasopharynx clear.  NECK:  Supple, no jugular venous distention. No thyroid enlargement, no tenderness.  LUNGS: Normal breath sounds bilaterally, no wheezing, rales,rhonchi or crepitation. No use of accessory muscles of respiration.  CARDIOVASCULAR: S1, S2 normal. No murmurs, rubs, or gallops.  ABDOMEN: Soft, non-tender, non-distended. Bowel sounds present. No organomegaly or mass.  EXTREMITIES: No pedal edema, cyanosis, or clubbing.  NEUROLOGIC: Cranial nerves II through XII are intact. Muscle strength 2-3/5 in all extremities. Sensation intact. Gait not checked.  PSYCHIATRIC: The patient is alert and oriented x 3.  SKIN: No obvious rash, lesion, or ulcer.  DATA REVIEW:   CBC  Recent Labs Lab 06/19/16 0448  WBC 8.3  HGB 12.6  HCT 36.8  PLT 242    Chemistries   Recent Labs Lab 06/16/16 1750 06/17/16 0755 06/19/16 0448  NA 141  --   --   K 3.4*  --   --   CL 102  --   --   CO2 29  --   --   GLUCOSE 152*  --   --   BUN 10  --   --   CREATININE 0.59  --  0.62  CALCIUM 9.1  --   --   MG  --  2.1  --   AST 36  --   --   ALT 27  --   --   ALKPHOS 88  --   --   BILITOT 0.6  --   --     Cardiac Enzymes  Recent Labs Lab 06/16/16 1750  TROPONINI <0.03    Microbiology Results  Results for orders placed or performed during the hospital encounter of 06/16/16  Urine culture     Status: Abnormal   Collection Time: 06/16/16  5:45 PM  Result Value Ref Range Status   Specimen Description URINE, RANDOM  Final   Special Requests NONE  Final  Culture >=100,000 COLONIES/mL ENTEROCOCCUS SPECIES (A)  Final   Report Status 06/19/2016 FINAL  Final   Organism ID,  Bacteria ENTEROCOCCUS SPECIES (A)  Final      Susceptibility   Enterococcus species - MIC*    AMPICILLIN <=2 SENSITIVE Sensitive     LEVOFLOXACIN >=8 RESISTANT Resistant     NITROFURANTOIN <=16 SENSITIVE Sensitive     VANCOMYCIN <=0.5 SENSITIVE Sensitive     * >=100,000 COLONIES/mL ENTEROCOCCUS SPECIES  MRSA PCR Screening     Status: None   Collection Time: 06/16/16 10:05 PM  Result Value Ref Range Status   MRSA by PCR NEGATIVE NEGATIVE Final    Comment:        The GeneXpert MRSA Assay (FDA approved for NASAL specimens only), is one component of a comprehensive MRSA colonization surveillance program. It is not intended to diagnose MRSA infection nor to guide or monitor treatment for MRSA infections.     RADIOLOGY:  No results found.   Management plans discussed with the patient, family and they are in agreement.  CODE STATUS:     Code Status Orders        Start     Ordered   06/16/16 2036  Full code   Continuous     06/16/16 2035    Code Status History    Date Active Date Inactive Code Status Order ID Comments User Context   03/13/2016  8:15 PM 03/14/2016  9:02 PM Full Code [id number]  Henreitta Leber, MD Inpatient   09/13/2015  6:51 PM 09/18/2015  7:31 PM Full Code [id number]  Dustin Flock, MD Inpatient   10/20/2011  1:40 PM 10/22/2011  1:27 PM Full Code [id number]  Tiajuana Amass, RN Inpatient    Advance Directive Documentation        Most Recent Value   Type of Advance Directive  Living will   Pre-existing out of facility DNR order (yellow form or pink MOST form)     "MOST" Form in Place?        TOTAL TIME TAKING CARE OF THIS PATIENT: 32 minutes.    Demetrios Loll M.D on 06/21/2016 at 4:02 PM  Between 7am to 6pm - Pager - [phone]  After 6pm go to [email or web address] - password [email or web address] [facility]  Office  [phone]  CC: Primary care physician; Michele Schultz   Note: This dictation was prepared with Dragon dictation along  with smaller phrase technology. Any transcriptional errors that result from this process are unintentional.

## 2016-06-21 NOTE — Care Management (Signed)
Report completed 06/21/16 at 1337 with Christy Rosler with [facility].

## 2016-06-21 NOTE — Progress Notes (Signed)
Physical Therapy Treatment Patient Details Name: Michele Schultz MRN: [id number] DOB: [birth year] Today's Date: 06/21/2016    History of Present Illness Michele Schultz is an 82yo black female who comes to [facility] on 7/14 following 3 days of progressive weakness and multiple falls at home. She also reports that during one of her falls earlier this week, she hit her forehead on her bedpost and has had HA and vertigo since. She reports that she falls every day due to legs giving out, which is consistent with her report in April of 2017 at that admission. She resides at [facility] where she has in home aide 2d/W for 2 hours each time. At baseline, she is quite sedentatry, walks only from bed to BR for voiding, otherwise performs all mobility with dependently in [facility] for appointments. PMH: FM, RA, HTN, CAD s/p CABG, B THA, B TKA, aortic stenosis, and s/p bovine aortic valve replacement.      PT Comments    Pt continues to be limited with functional mobility d/t c/o of significant dizziness (room spinning) and nausea with functional mobility (MD Bridgett Larsson and nursing notified).  No nystagmus noted but pt swaying upon sitting on edge of bed and standing requiring assist to steady and for safety.  Unable to progress pt to transfer to chair d/t significant dizziness and c/o nausea.  Continue to recommend pt discharge to STR (pt currently not at [facility] and not safe to discharge back to prior living situation without significant increased assist).   Follow Up Recommendations  SNF     Equipment Recommendations   (may need hoyer for transfers d/t mobility difficulty/assist needs (CM notified))    Recommendations for Other Services       Precautions / Restrictions Precautions Precautions: Fall Restrictions Weight Bearing Restrictions: No    Mobility  Bed Mobility Overal bed mobility: Needs Assistance Bed Mobility: Supine to Sit;Sit to Supine     Supine to sit: Mod assist Sit to supine: Mod assist;Max  assist;+2 for physical assistance   General bed mobility comments: assist for trunk supine to sit; assist for trunk and B LE's sit to supine; vc's for technique and UE use required  Transfers Overall transfer level: Needs assistance Equipment used: None Transfers: Sit to/from Stand Sit to Stand: Mod assist         General transfer comment: B knees blocked; L UE on bedrail; vc's for feet and UE placement required  Ambulation/Gait             General Gait Details: not appropriate at this time d/t significant dizzines with standing   Stairs            Wheelchair Mobility    Modified Rankin (Stroke Patients Only)       Balance Overall balance assessment: Needs assistance Sitting-balance support: Bilateral upper extremity supported;Feet supported Sitting balance-Leahy Scale: Poor Sitting balance - Comments: pt initially swaying backwards upon sitting up d/t c/o dizziness but balance improved with time sitting up (initially min assist but improved to close SBA)   Standing balance support: Single extremity supported (L UE on siderail) Standing balance-Leahy Scale: Poor Standing balance comment: pt swaying d/t dizziness requiring assist to steady                    Cognition Arousal/Alertness: Awake/alert Behavior During Therapy: WFL for tasks assessed/performed Overall Cognitive Status: Within Functional Limits for tasks assessed  Exercises General Exercises - Lower Extremity Ankle Circles/Pumps: AAROM;Strengthening;Both;10 reps;Supine Quad Sets: AROM;Strengthening;Both;10 reps;Supine Gluteal Sets: AROM;Strengthening;Both;10 reps;Supine Short Arc Quad: AAROM;Strengthening;Both;10 reps;Supine Heel Slides: AAROM;Strengthening;Both;10 reps;Supine Hip ABduction/ADduction: AAROM;Strengthening;Both;10 reps;Supine    General Comments   Nursing cleared pt for participation in physical therapy.  Pt agreeable to PT session.       Pertinent Vitals/Pain Pain Score: 4  Pain Location: HA Pain Descriptors / Indicators: Headache Pain Intervention(s): Limited activity within patient's tolerance;Monitored during session;Repositioned    Home Living                      Prior Function            PT Goals (current goals can now be found in the care plan section) Acute Rehab PT Goals Patient Stated Goal: be able to do more (in regards to functional mobility) PT Goal Formulation: With patient Time For Goal Achievement: 07/01/16 Potential to Achieve Goals: Fair Progress towards PT goals: Progressing toward goals    Frequency  Min 2X/week    PT Plan Current plan remains appropriate    Co-evaluation             End of Session Equipment Utilized During Treatment: Gait belt Activity Tolerance: Other (comment) (Limited d/t dizziness and nausea) Patient left: in bed;with call bell/phone within reach;with bed alarm set (B heels elevated via pillow)     Time: [id number] PT Time Calculation (min) (ACUTE ONLY): 27 min  Charges:  $Therapeutic Exercise: 8-22 mins $Therapeutic Activity: 8-22 mins                    G CodesLeitha Bleak 07/04/2016, 1:42 PM Leitha Bleak, [place]

## 2016-06-21 NOTE — Care Management (Addendum)
Spoke with [facility] after CSW notified me that EMS has called requesting [place] to provide transportation. I have left message for Crew Chief with [facility] to call this RNCM to discuss. Apparently they have limited trucks available at this time however [facility] cannot provide transportation because patient is unable to walk.I spoke with [facility] crew chief and he said they would get to her as soon as they can- they are backed up. RN updated. I have notified Evette at [facility] that patient may come back late to her apartment but will be arriving today. Niece [name] also notified about [facility] and APS referral. CASE CLOSED.

## 2016-06-21 NOTE — Progress Notes (Signed)
Patient was discharged via EMS to [facility]. Family to meet EMS at home. NSL removed with cath intact. Reviewed discharge instructions and last dose meds given. Allowed time for questions. Patient to make follow-up appointment. Home health ordered.

## 2016-06-22 ENCOUNTER — Encounter: Payer: Self-pay | Admitting: Internal Medicine

## 2016-06-23 ENCOUNTER — Observation Stay: Payer: Medicare Other

## 2016-06-23 ENCOUNTER — Observation Stay
Admission: EM | Admit: 2016-06-23 | Discharge: 2016-06-26 | Disposition: A | Discharge status: Skilled Nursing Facility | Payer: Medicare Other | Attending: Internal Medicine | Admitting: Internal Medicine

## 2016-06-23 ENCOUNTER — Emergency Department: Payer: Medicare Other

## 2016-06-23 ENCOUNTER — Encounter: Payer: Self-pay | Admitting: Emergency Medicine

## 2016-06-23 DIAGNOSIS — Z96659 Presence of unspecified artificial knee joint: Secondary | ICD-10-CM | POA: Insufficient documentation

## 2016-06-23 DIAGNOSIS — Z6841 Body Mass Index (BMI) 40.0 and over, adult: Secondary | ICD-10-CM | POA: Insufficient documentation

## 2016-06-23 DIAGNOSIS — K449 Diaphragmatic hernia without obstruction or gangrene: Secondary | ICD-10-CM | POA: Insufficient documentation

## 2016-06-23 DIAGNOSIS — J45909 Unspecified asthma, uncomplicated: Secondary | ICD-10-CM | POA: Diagnosis not present

## 2016-06-23 DIAGNOSIS — A047 Enterocolitis due to Clostridium difficile: Principal | ICD-10-CM | POA: Insufficient documentation

## 2016-06-23 DIAGNOSIS — I480 Paroxysmal atrial fibrillation: Secondary | ICD-10-CM | POA: Diagnosis not present

## 2016-06-23 DIAGNOSIS — R296 Repeated falls: Secondary | ICD-10-CM | POA: Insufficient documentation

## 2016-06-23 DIAGNOSIS — I700 Atherosclerosis of aorta: Secondary | ICD-10-CM | POA: Diagnosis not present

## 2016-06-23 DIAGNOSIS — R197 Diarrhea, unspecified: Secondary | ICD-10-CM

## 2016-06-23 DIAGNOSIS — Z833 Family history of diabetes mellitus: Secondary | ICD-10-CM | POA: Insufficient documentation

## 2016-06-23 DIAGNOSIS — Z96641 Presence of right artificial hip joint: Secondary | ICD-10-CM | POA: Insufficient documentation

## 2016-06-23 DIAGNOSIS — Z950 Presence of cardiac pacemaker: Secondary | ICD-10-CM | POA: Diagnosis not present

## 2016-06-23 DIAGNOSIS — I2510 Atherosclerotic heart disease of native coronary artery without angina pectoris: Secondary | ICD-10-CM | POA: Diagnosis not present

## 2016-06-23 DIAGNOSIS — K219 Gastro-esophageal reflux disease without esophagitis: Secondary | ICD-10-CM | POA: Insufficient documentation

## 2016-06-23 DIAGNOSIS — M1990 Unspecified osteoarthritis, unspecified site: Secondary | ICD-10-CM | POA: Diagnosis not present

## 2016-06-23 DIAGNOSIS — I708 Atherosclerosis of other arteries: Secondary | ICD-10-CM | POA: Diagnosis not present

## 2016-06-23 DIAGNOSIS — M47812 Spondylosis without myelopathy or radiculopathy, cervical region: Secondary | ICD-10-CM | POA: Diagnosis not present

## 2016-06-23 DIAGNOSIS — I5032 Chronic diastolic (congestive) heart failure: Secondary | ICD-10-CM | POA: Diagnosis not present

## 2016-06-23 DIAGNOSIS — E876 Hypokalemia: Secondary | ICD-10-CM | POA: Diagnosis not present

## 2016-06-23 DIAGNOSIS — M4800 Spinal stenosis, site unspecified: Secondary | ICD-10-CM | POA: Insufficient documentation

## 2016-06-23 DIAGNOSIS — E785 Hyperlipidemia, unspecified: Secondary | ICD-10-CM | POA: Insufficient documentation

## 2016-06-23 DIAGNOSIS — N200 Calculus of kidney: Secondary | ICD-10-CM | POA: Insufficient documentation

## 2016-06-23 DIAGNOSIS — R262 Difficulty in walking, not elsewhere classified: Secondary | ICD-10-CM | POA: Diagnosis present

## 2016-06-23 DIAGNOSIS — Z7401 Bed confinement status: Secondary | ICD-10-CM | POA: Diagnosis not present

## 2016-06-23 DIAGNOSIS — E1143 Type 2 diabetes mellitus with diabetic autonomic (poly)neuropathy: Secondary | ICD-10-CM | POA: Diagnosis not present

## 2016-06-23 DIAGNOSIS — M797 Fibromyalgia: Secondary | ICD-10-CM | POA: Diagnosis not present

## 2016-06-23 DIAGNOSIS — N393 Stress incontinence (female) (male): Secondary | ICD-10-CM | POA: Insufficient documentation

## 2016-06-23 DIAGNOSIS — R531 Weakness: Secondary | ICD-10-CM | POA: Diagnosis not present

## 2016-06-23 DIAGNOSIS — Z7982 Long term (current) use of aspirin: Secondary | ICD-10-CM | POA: Insufficient documentation

## 2016-06-23 DIAGNOSIS — R112 Nausea with vomiting, unspecified: Secondary | ICD-10-CM

## 2016-06-23 DIAGNOSIS — Z952 Presence of prosthetic heart valve: Secondary | ICD-10-CM | POA: Insufficient documentation

## 2016-06-23 DIAGNOSIS — I110 Hypertensive heart disease with heart failure: Secondary | ICD-10-CM | POA: Diagnosis not present

## 2016-06-23 DIAGNOSIS — Z8249 Family history of ischemic heart disease and other diseases of the circulatory system: Secondary | ICD-10-CM | POA: Insufficient documentation

## 2016-06-23 DIAGNOSIS — Z8673 Personal history of transient ischemic attack (TIA), and cerebral infarction without residual deficits: Secondary | ICD-10-CM | POA: Insufficient documentation

## 2016-06-23 DIAGNOSIS — Z951 Presence of aortocoronary bypass graft: Secondary | ICD-10-CM | POA: Insufficient documentation

## 2016-06-23 DIAGNOSIS — M8580 Other specified disorders of bone density and structure, unspecified site: Secondary | ICD-10-CM | POA: Insufficient documentation

## 2016-06-23 DIAGNOSIS — Z953 Presence of xenogenic heart valve: Secondary | ICD-10-CM | POA: Insufficient documentation

## 2016-06-23 DIAGNOSIS — Z9889 Other specified postprocedural states: Secondary | ICD-10-CM | POA: Insufficient documentation

## 2016-06-23 DIAGNOSIS — F4321 Adjustment disorder with depressed mood: Secondary | ICD-10-CM | POA: Insufficient documentation

## 2016-06-23 DIAGNOSIS — R1110 Vomiting, unspecified: Secondary | ICD-10-CM

## 2016-06-23 DIAGNOSIS — I442 Atrioventricular block, complete: Secondary | ICD-10-CM | POA: Diagnosis not present

## 2016-06-23 DIAGNOSIS — Z888 Allergy status to other drugs, medicaments and biological substances status: Secondary | ICD-10-CM | POA: Insufficient documentation

## 2016-06-23 LAB — URINALYSIS COMPLETE WITH MICROSCOPIC (ARMC ONLY)
BILIRUBIN URINE: NEGATIVE
Bacteria, UA: NONE SEEN
GLUCOSE, UA: 50 mg/dL — AB
KETONES UR: NEGATIVE mg/dL
Leukocytes, UA: NEGATIVE
Nitrite: NEGATIVE
PH: 6 (ref 5.0–8.0)
Protein, ur: 30 mg/dL — AB
Specific Gravity, Urine: 1.024 (ref 1.005–1.030)

## 2016-06-23 LAB — LIPASE, BLOOD: LIPASE: 34 U/L (ref 11–51)

## 2016-06-23 LAB — CBC
HCT: 37.5 % (ref 35.0–47.0)
Hemoglobin: 12.3 g/dL (ref 12.0–16.0)
MCH: 27.7 pg (ref 26.0–34.0)
MCHC: 32.7 g/dL (ref 32.0–36.0)
MCV: 84.5 fL (ref 80.0–100.0)
PLATELETS: 257 10*3/uL (ref 150–440)
RBC: 4.43 MIL/uL (ref 3.80–5.20)
RDW: 16.7 % — AB (ref 11.5–14.5)
WBC: 8.2 10*3/uL (ref 3.6–11.0)

## 2016-06-23 LAB — GASTROINTESTINAL PANEL BY PCR, STOOL (REPLACES STOOL CULTURE)
ASTROVIRUS: NOT DETECTED
Adenovirus F40/41: NOT DETECTED
Campylobacter species: NOT DETECTED
Cryptosporidium: NOT DETECTED
Cyclospora cayetanensis: NOT DETECTED
E. COLI O157: NOT DETECTED
ENTEROTOXIGENIC E COLI (ETEC): NOT DETECTED
Entamoeba histolytica: NOT DETECTED
Enteroaggregative E coli (EAEC): NOT DETECTED
Enteropathogenic E coli (EPEC): NOT DETECTED
Giardia lamblia: NOT DETECTED
NOROVIRUS GI/GII: NOT DETECTED
Plesimonas shigelloides: NOT DETECTED
ROTAVIRUS A: NOT DETECTED
SAPOVIRUS (I, II, IV, AND V): NOT DETECTED
SHIGA LIKE TOXIN PRODUCING E COLI (STEC): NOT DETECTED
Salmonella species: NOT DETECTED
Shigella/Enteroinvasive E coli (EIEC): NOT DETECTED
VIBRIO CHOLERAE: NOT DETECTED
Vibrio species: NOT DETECTED
Yersinia enterocolitica: NOT DETECTED

## 2016-06-23 LAB — CLOSTRIDIUM DIFFICILE BY PCR: Toxigenic C. Difficile by PCR: POSITIVE — AB

## 2016-06-23 LAB — TROPONIN I

## 2016-06-23 LAB — COMPREHENSIVE METABOLIC PANEL
ALK PHOS: 91 U/L (ref 38–126)
ALT: 31 U/L (ref 14–54)
AST: 42 U/L — AB (ref 15–41)
Albumin: 3.7 g/dL (ref 3.5–5.0)
Anion gap: 12 (ref 5–15)
BUN: 13 mg/dL (ref 6–20)
CALCIUM: 9.4 mg/dL (ref 8.9–10.3)
CHLORIDE: 104 mmol/L (ref 101–111)
CO2: 24 mmol/L (ref 22–32)
CREATININE: 0.72 mg/dL (ref 0.44–1.00)
GFR calc non Af Amer: >60 mL/min (ref >60)
GLUCOSE: 206 mg/dL — AB (ref 65–99)
Potassium: 3.3 mmol/L — ABNORMAL LOW (ref 3.5–5.1)
SODIUM: 140 mmol/L (ref 135–145)
Total Bilirubin: 0.6 mg/dL (ref 0.3–1.2)
Total Protein: 7.6 g/dL (ref 6.5–8.1)

## 2016-06-23 LAB — C DIFFICILE QUICK SCREEN W PCR REFLEX
C DIFFICLE (CDIFF) ANTIGEN: POSITIVE — AB
C Diff toxin: NEGATIVE

## 2016-06-23 LAB — MAGNESIUM: MAGNESIUM: 1.9 mg/dL (ref 1.7–2.4)

## 2016-06-23 MED ORDER — TRAMADOL HCL 50 MG PO TABS
50.0000 mg | ORAL_TABLET | Freq: Four times a day (QID) | ORAL | Status: DC | PRN
  Administered 2016-06-24 – 2016-06-26 (×2): 50 mg via ORAL
  Filled 2016-06-23 (×2): qty 1

## 2016-06-23 MED ORDER — ACETAMINOPHEN 650 MG RE SUPP
650.0000 mg | Freq: Four times a day (QID) | RECTAL | Status: DC | PRN
Start: 2016-06-23 — End: 2016-06-26

## 2016-06-23 MED ORDER — AMLODIPINE BESYLATE 5 MG PO TABS
5.0000 mg | ORAL_TABLET | Freq: Every day | ORAL | Status: DC
  Administered 2016-06-23 – 2016-06-24 (×2): 5 mg via ORAL
  Filled 2016-06-23 (×3): qty 1

## 2016-06-23 MED ORDER — PHENAZOPYRIDINE HCL 200 MG PO TABS
200.0000 mg | ORAL_TABLET | Freq: Three times a day (TID) | ORAL | Status: DC | PRN
  Filled 2016-06-23: qty 1

## 2016-06-23 MED ORDER — GABAPENTIN 300 MG PO CAPS
300.0000 mg | ORAL_CAPSULE | Freq: Two times a day (BID) | ORAL | Status: DC
  Administered 2016-06-23 – 2016-06-26 (×6): 300 mg via ORAL
  Filled 2016-06-23 (×6): qty 1

## 2016-06-23 MED ORDER — SODIUM CHLORIDE 0.9 % IV BOLUS (SEPSIS)
500.0000 mL | Freq: Once | INTRAVENOUS | Status: AC
  Administered 2016-06-23: 500 mL via INTRAVENOUS

## 2016-06-23 MED ORDER — ONDANSETRON HCL 4 MG/2ML IJ SOLN
4.0000 mg | Freq: Four times a day (QID) | INTRAMUSCULAR | Status: DC | PRN
  Administered 2016-06-24 (×2): 4 mg via INTRAVENOUS
  Filled 2016-06-23 (×2): qty 2

## 2016-06-23 MED ORDER — NITROGLYCERIN 0.4 MG SL SUBL: 0.4000 mg | SUBLINGUAL_TABLET | SUBLINGUAL | Status: DC | PRN

## 2016-06-23 MED ORDER — HYDROCODONE-ACETAMINOPHEN 10-325 MG PO TABS
1.0000 | ORAL_TABLET | ORAL | Status: DC | PRN
  Administered 2016-06-24 – 2016-06-25 (×6): 1 via ORAL
  Filled 2016-06-23 (×6): qty 1

## 2016-06-23 MED ORDER — METOPROLOL SUCCINATE ER 50 MG PO TB24
50.0000 mg | ORAL_TABLET | Freq: Every day | ORAL | Status: DC
  Administered 2016-06-23 – 2016-06-26 (×3): 50 mg via ORAL
  Filled 2016-06-23 (×4): qty 1

## 2016-06-23 MED ORDER — ONDANSETRON HCL 4 MG PO TABS: 4.0000 mg | ORAL_TABLET | Freq: Four times a day (QID) | ORAL | Status: DC | PRN

## 2016-06-23 MED ORDER — CYCLOSPORINE 0.05 % OP EMUL
1.0000 [drp] | Freq: Two times a day (BID) | OPHTHALMIC | Status: DC
  Administered 2016-06-23 – 2016-06-26 (×6): 1 [drp] via OPHTHALMIC
  Filled 2016-06-23 (×6): qty 1

## 2016-06-23 MED ORDER — LOSARTAN POTASSIUM 25 MG PO TABS
25.0000 mg | ORAL_TABLET | Freq: Every day | ORAL | Status: DC
  Administered 2016-06-23 – 2016-06-26 (×4): 25 mg via ORAL
  Filled 2016-06-23 (×4): qty 1

## 2016-06-23 MED ORDER — METOCLOPRAMIDE HCL 10 MG PO TABS
10.0000 mg | ORAL_TABLET | Freq: Three times a day (TID) | ORAL | Status: DC | PRN
  Administered 2016-06-24 – 2016-06-25 (×3): 10 mg via ORAL
  Filled 2016-06-23 (×3): qty 1

## 2016-06-23 MED ORDER — ACETAMINOPHEN 500 MG PO TABS
1000.0000 mg | ORAL_TABLET | Freq: Once | ORAL | Status: AC
  Administered 2016-06-23: 1000 mg via ORAL
  Filled 2016-06-23: qty 2

## 2016-06-23 MED ORDER — PANTOPRAZOLE SODIUM 40 MG PO TBEC
40.0000 mg | DELAYED_RELEASE_TABLET | Freq: Two times a day (BID) | ORAL | Status: DC
  Administered 2016-06-23 – 2016-06-26 (×7): 40 mg via ORAL
  Filled 2016-06-23 (×7): qty 1

## 2016-06-23 MED ORDER — AZELASTINE HCL 0.1 % NA SOLN
1.0000 | NASAL | Status: DC | PRN
  Filled 2016-06-23: qty 30

## 2016-06-23 MED ORDER — BENZONATATE 100 MG PO CAPS: 200.0000 mg | ORAL_CAPSULE | Freq: Three times a day (TID) | ORAL | Status: DC | PRN

## 2016-06-23 MED ORDER — ONDANSETRON HCL 4 MG/2ML IJ SOLN
4.0000 mg | Freq: Once | INTRAMUSCULAR | Status: DC
  Filled 2016-06-23: qty 2

## 2016-06-23 MED ORDER — DIATRIZOATE MEGLUMINE & SODIUM 66-10 % PO SOLN
15.0000 mL | Freq: Once | ORAL | Status: AC
  Administered 2016-06-23: 15 mL via ORAL

## 2016-06-23 MED ORDER — IPRATROPIUM-ALBUTEROL 0.5-2.5 (3) MG/3ML IN SOLN: 3.0000 mL | Freq: Four times a day (QID) | RESPIRATORY_TRACT | Status: DC | PRN

## 2016-06-23 MED ORDER — POTASSIUM CHLORIDE CRYS ER 20 MEQ PO TBCR
40.0000 meq | EXTENDED_RELEASE_TABLET | Freq: Once | ORAL | Status: AC
  Administered 2016-06-23: 40 meq via ORAL
  Filled 2016-06-23: qty 2

## 2016-06-23 MED ORDER — TRAMADOL HCL 50 MG PO TABS
ORAL_TABLET | ORAL | Status: AC
  Administered 2016-06-23: 50 mg via ORAL
  Filled 2016-06-23: qty 1

## 2016-06-23 MED ORDER — VANCOMYCIN 50 MG/ML ORAL SOLUTION
125.0000 mg | Freq: Four times a day (QID) | ORAL | Status: DC
  Administered 2016-06-23 – 2016-06-26 (×11): 125 mg via ORAL
  Filled 2016-06-23 (×13): qty 2.5

## 2016-06-23 MED ORDER — ACETAMINOPHEN 325 MG PO TABS
650.0000 mg | ORAL_TABLET | Freq: Four times a day (QID) | ORAL | Status: DC | PRN
Start: 2016-06-23 — End: 2016-06-26
  Filled 2016-06-23: qty 2

## 2016-06-23 MED ORDER — TRAMADOL HCL 50 MG PO TABS
50.0000 mg | ORAL_TABLET | Freq: Once | ORAL | Status: AC
  Administered 2016-06-23: 50 mg via ORAL

## 2016-06-23 MED ORDER — ASPIRIN EC 81 MG PO TBEC
81.0000 mg | DELAYED_RELEASE_TABLET | Freq: Every day | ORAL | Status: DC
  Administered 2016-06-23 – 2016-06-26 (×4): 81 mg via ORAL
  Filled 2016-06-23 (×4): qty 1

## 2016-06-23 MED ORDER — IOPAMIDOL (ISOVUE-300) INJECTION 61%
100.0000 mL | Freq: Once | INTRAVENOUS | Status: AC | PRN
  Administered 2016-06-23: 100 mL via INTRAVENOUS

## 2016-06-23 MED ORDER — CYANOCOBALAMIN 1000 MCG/ML IJ SOLN
1000.0000 ug | INTRAMUSCULAR | Status: DC
  Administered 2016-06-23: 1000 ug via INTRAMUSCULAR
  Filled 2016-06-23: qty 1

## 2016-06-23 MED ORDER — ENOXAPARIN SODIUM 40 MG/0.4ML ~~LOC~~ SOLN
40.0000 mg | SUBCUTANEOUS | Status: DC
  Administered 2016-06-23 – 2016-06-25 (×3): 40 mg via SUBCUTANEOUS
  Filled 2016-06-23 (×3): qty 0.4

## 2016-06-23 MED ORDER — RISAQUAD PO CAPS
1.0000 | ORAL_CAPSULE | Freq: Two times a day (BID) | ORAL | Status: DC
  Administered 2016-06-23 – 2016-06-26 (×6): 1 via ORAL
  Filled 2016-06-23 (×6): qty 1

## 2016-06-23 NOTE — ED Notes (Signed)
Patient transported to CT 

## 2016-06-23 NOTE — ED Notes (Signed)
Admitting MD at bedside.

## 2016-06-23 NOTE — H&P (Signed)
[facility] [place] NAME: Michele Schultz    MR#:  [id number]  DATE OF BIRTH:  14-Jan-1931  DATE OF ADMISSION:  06/23/2016  PRIMARY CARE PHYSICIAN: [name]   REQUESTING/REFERRING PHYSICIAN: Dr. Nance Pear  CHIEF COMPLAINT:   Chief Complaint  Patient presents with  . Nausea  . Emesis  . Dizziness  . Fall    HISTORY OF PRESENT ILLNESS:  Michele Schultz  is a 82 y.o. female with Multiple medical problems including fibromyalgia, hypertension, CAD status post CABG, aortic stenosis, arthritis, spinal stenosis and inability to get up, GERD who was just discharged from the hospital 2 days ago comes back again with diarrhea and weakness. Patient has chronic nausea, vomiting and previous admissions for the same. During her last admission, he also had diarrhea. Stool studies were negative and patient was actually referred to go to rehabilitation. Due to some insurance issues, patient was discharged home with home health and the social worker was supposed to follow up with the patient. Patient has been bedbound for the last several months and is being followed by Drs. making house calls. Today she was noted to be lying in her own stool and urine and was brought to the emergency room. She complains of worsening weakness and diarrhea. Denies any abdominal pain. But has myalgias and pain all over her body. She was noted to be hypokalemic.  PAST MEDICAL HISTORY:   Past Medical History  Diagnosis Date  . Hypertensive heart disease   . Anemia   . Neuralgia, neuritis, and radiculitis, unspecified   . Coronary artery disease     a. s/p 2v CABG 11/12 (VG-LAD, VG-OM1); b. Lexiscan 08/2015: low risk, no ischemia, EF 55-65%.  . Aortic stenosis, severe     a. s/p Magna Ease pericardial tissue valve size 21 mm replacement in 10/2011 for severe AS 11/12; b. echo 07/2015: EF 55-60% mod concentric LVH, GR1DD, LA mildly dilated, PASP 45 mm Hg   . Onychia and paronychia of toe   . Osteoarthrosis, unspecified whether generalized or localized, pelvic region and thigh     mainly in her back and knees  . Enthesopathy of hip region   . Esophageal reflux     followed by Dr.Seigal. stabilized with a combination of Nexium and Zantac  . Knee joint replacement by other means   . Stress incontinence, female     followed by Dr.Cope  . Complete heart block [facility])     a. s/p St Jude PPM 10/2011 (Ser # [id number]).  . Cardiac pacemaker -st Judes     11/12  . Chronic diastolic heart failure ([place])     a. echo 2014: EF 55-60%, no RWMA, GR1DD, PASP 47 mm Hg; b. echo 07/2015: EF 55-60% mod concentric LVH, GR1DD, LA mildly dilated, PASP 45 mm Hg  . Type II or unspecified type diabetes mellitus without mention of complication, not stated as uncontrolled     a. pt. reports that she is borderline   . Spinal stenosis   . Neuropathy ([place])   . Morbid obesity ([place])   . RAD (reactive airway disease)     a. chronic SOB  . Fibromyalgia   . HLD (hyperlipidemia)   . PAF (paroxysmal atrial fibrillation) ([place])     a. brief episodes of AF previously noted on device interrogations.  . Wide-complex tachycardia ([place])     a. Noted 4/10 - brief episode in ED. Noted again 4/21 in clinic->felt most likely  to be atrial tach.    PAST SURGICAL HISTORY:   Past Surgical History  Procedure Laterality Date  . Replacement total knee  11/2006    right knee  . Nasal sinus surgery  2009  . Total hip arthroplasty  09/2010  . Cardiac catheterization  08/2009    50% stenosis distal left main, 50% stenosis ostial left circumflex.   . Cardiac catheterization      at [facility]  . Eye surgery      IOL/ after cataracts removed- bilateral   . Aortic valve replacement  10/17/2011    Procedure: AORTIC VALVE REPLACEMENT (AVR);  Surgeon: Melrose Nakayama, MD;  Location: [facility];  Service: Open Heart Surgery;  Laterality: N/A;  . Coronary artery bypass graft  10/17/2011    Procedure:  CORONARY ARTERY BYPASS GRAFTING (CABG);  Surgeon: Melrose Nakayama, MD;  Location: [facility];  Service: Open Heart Surgery;  Laterality: N/A;  Times Two, using right leg greater saphenous vein harvested endoscopically  . Permanent pacemaker insertion N/A 10/20/2011    Procedure: PERMANENT PACEMAKER INSERTION;  Surgeon: Thompson Grayer, MD;  Location: [facility] CATH LAB;  Service: Cardiovascular;  Laterality: N/A;    SOCIAL HISTORY:   Social History  Substance Use Topics  . Smoking status: Never Smoker   . Smokeless tobacco: Never Used  . Alcohol Use: No    FAMILY HISTORY:   Family History  Problem Relation Age of Onset  . Diabetes Other   . Heart disease Sister   . Heart disease Schultz   . Heart disease Schultz   . Heart disease Schultz   . Anesthesia problems Neg Hx   . Hypotension Neg Hx   . Malignant hyperthermia Neg Hx   . Pseudochol deficiency Neg Hx     DRUG ALLERGIES:   Allergies  Allergen Reactions  . Citalopram Other (See Comments)    Reaction:  Altered mental status   . Cymbalta [Duloxetine Hcl] Other (See Comments)    Reaction:  Sedative for pt   . Imipramine Other (See Comments)    Reaction:  Unknown   . Proton Pump Inhibitors Other (See Comments)    Reaction:  Unknown   . Venlafaxine Nausea And Vomiting and Other (See Comments)    Reaction:  Dizziness     REVIEW OF SYSTEMS:   Review of Systems  Constitutional: Positive for malaise/fatigue. Negative for fever, chills and weight loss.  HENT: Negative for ear discharge, ear pain, nosebleeds and tinnitus.   Eyes: Positive for blurred vision. Negative for double vision and photophobia.  Respiratory: Positive for shortness of breath. Negative for cough, hemoptysis and wheezing.   Cardiovascular: Negative for chest pain, palpitations, orthopnea and leg swelling.  Gastrointestinal: Positive for nausea, vomiting and diarrhea. Negative for heartburn, abdominal pain, constipation and melena.  Genitourinary: Negative for  dysuria, urgency, frequency and hematuria.  Musculoskeletal: Positive for myalgias, back pain and joint pain. Negative for neck pain.  Skin: Negative for rash.  Neurological: Positive for dizziness and headaches. Negative for tremors, sensory change, speech change and focal weakness.  Endo/Heme/Allergies: Does not bruise/bleed easily.  Psychiatric/Behavioral: Negative for depression. The patient is nervous/anxious.     MEDICATIONS AT HOME:   Prior to Admission medications   Medication Sig Start Date End Date Taking? Authorizing Provider  albuterol-ipratropium (COMBIVENT) 18-103 MCG/ACT inhaler Inhale 2 puffs into the lungs every 6 (six) hours as needed for wheezing. 06/19/13   Minna Merritts, MD  amLODipine (NORVASC) 5 MG tablet Take 1 tablet (  5 mg total) by mouth daily. 11/01/15   Lauree Chandler, NP  aspirin EC 81 MG tablet Take 81 mg by mouth daily.    Historical Provider, MD  azelastine (ASTELIN) 0.1 % nasal spray Place 1 spray into both nostrils as needed for rhinitis.     Historical Provider, MD  benzonatate (TESSALON) 100 MG capsule Take 200 mg by mouth 3 (three) times daily as needed for cough.     Historical Provider, MD  cyanocobalamin (,VITAMIN B-12,) 1000 MCG/ML injection Inject 1,000 mcg into the muscle every 30 (thirty) days.    Historical Provider, MD  cycloSPORINE (RESTASIS) 0.05 % ophthalmic emulsion Place 1 drop into both eyes 2 (two) times daily.     Historical Provider, MD  furosemide (LASIX) 20 MG tablet Take 1 tablet (20 mg total) by mouth every other day. 04/06/16   Rogelia Mire, NP  gabapentin (NEURONTIN) 300 MG capsule Take 1 capsule (300 mg total) by mouth 2 (two) times daily. 11/01/15   Lauree Chandler, NP  HYDROcodone-acetaminophen (NORCO) 10-325 MG tablet Take 1 tablet by mouth every 4 (four) hours as needed for moderate pain. Reported on 05/30/2016    Historical Provider, MD  losartan (COZAAR) 25 MG tablet Take 25 mg by mouth daily.    Historical Provider,  MD  magnesium hydroxide (MILK OF MAGNESIA) 400 MG/5ML suspension Take 15 mLs by mouth daily as needed for mild constipation.     Historical Provider, MD  metoCLOPramide (REGLAN) 10 MG tablet Take 1 tablet (10 mg total) by mouth every 8 (eight) hours as needed for nausea or vomiting. 04/23/16   Earleen Newport, MD  metoprolol succinate (TOPROL-XL) 50 MG 24 hr tablet Take 1 tablet (50 mg total) by mouth daily. 11/01/15   Lauree Chandler, NP  nitroGLYCERIN (NITROSTAT) 0.4 MG SL tablet Place 1 tablet (0.4 mg total) under the tongue every 5 (five) minutes as needed for chest pain. 01/24/13   Minna Merritts, MD  pantoprazole (PROTONIX) 40 MG tablet Take 40 mg by mouth 2 (two) times daily.      Historical Provider, MD  phenazopyridine (PYRIDIUM) 200 MG tablet Take 1 tablet (200 mg total) by mouth 3 (three) times daily as needed for pain. 07/10/15 07/09/16  Earleen Newport, MD  potassium chloride (K-DUR) 10 MEQ tablet Take 10 mEq by mouth daily.    Historical Provider, MD  promethazine (PHENERGAN) 25 MG tablet Take 1 tablet (25 mg total) by mouth every 6 (six) hours as needed for nausea or vomiting. 06/17/14   Minna Merritts, MD  traMADol (ULTRAM) 50 MG tablet Take 1 tablet (50 mg total) by mouth every 6 (six) hours as needed. 04/23/16 04/23/17  Earleen Newport, MD      VITAL SIGNS:  Blood pressure 128/67, pulse 96, temperature 98.7 F (37.1 C), temperature source Oral, resp. rate 18, height 5\' 4"  (1.626 m), weight 108.863 kg (240 lb), SpO2 100 %.  PHYSICAL EXAMINATION:   Physical Exam  GENERAL:  82 y.o.-year-old obese patient lying in the bed with no acute distress.  EYES: Pupils equal, round, reactive to light and accommodation. No scleral icterus. Extraocular muscles intact.  HEENT: Head atraumatic, normocephalic. Oropharynx and nasopharynx clear.  NECK:  Supple, no jugular venous distention. No thyroid enlargement, no tenderness.  LUNGS: Normal breath sounds bilaterally, no wheezing,  rales,rhonchi or crepitation. No use of accessory muscles of respiration. Decreased bibasilar breath sounds CARDIOVASCULAR: S1, S2 normal. No murmurs, rubs, or gallops.  ABDOMEN:  Soft, obese, nontender, nondistended. Bowel sounds present. No organomegaly or mass.  EXTREMITIES: No pedal edema, cyanosis, or clubbing.  NEUROLOGIC: Cranial nerves II through XII are intact. Muscle strength 5/5 in both upper extremities, 2/5 in both lower extremities.. Sensation intact. Gait not checked.  PSYCHIATRIC: The patient is alert and oriented x 3.  SKIN: No obvious rash, lesion, or ulcer.   LABORATORY PANEL:   CBC  Recent Labs Lab 06/23/16 1449  WBC 8.2  HGB 12.3  HCT 37.5  PLT 257   ------------------------------------------------------------------------------------------------------------------  Chemistries   Recent Labs Lab 06/23/16 1449  NA 140  K 3.3*  CL 104  CO2 24  GLUCOSE 206*  BUN 13  CREATININE 0.72  CALCIUM 9.4  MG 1.9  AST 42*  ALT 31  ALKPHOS 91  BILITOT 0.6   ------------------------------------------------------------------------------------------------------------------  Cardiac Enzymes  Recent Labs Lab 06/23/16 1449  TROPONINI <0.03   ------------------------------------------------------------------------------------------------------------------  RADIOLOGY:  Ct Head Wo Contrast  06/23/2016  CLINICAL DATA:  Fall hitting both front and back of her head. Persistent nausea, vomiting, and diarrhea. New onset headache. EXAM: CT HEAD WITHOUT CONTRAST CT CERVICAL SPINE WITHOUT CONTRAST TECHNIQUE: Multidetector CT imaging of the head and cervical spine was performed following the standard protocol without intravenous contrast. Multiplanar CT image reconstructions of the cervical spine were also generated. COMPARISON:  CT of the head 06/16/2016. CT of the head and cervical spine 06/05/2016 FINDINGS: CT HEAD FINDINGS Mild generalized atrophy and white matter disease  is stable. No acute cortical infarct, hemorrhage, or mass lesion is present. Basal ganglia are intact. Insular ribbon is normal. A remote right cerebellar infarct is stable. The brainstem and cerebellum are otherwise unremarkable. No acute hemorrhage is present. No significant extracranial soft tissue trauma is evident. The calvarium is intact. 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 Atherosclerotic calcifications are present within the cavernous internal carotid arteries. Bilateral lens replacements are present. The globes and orbits are otherwise intact. CT CERVICAL SPINE FINDINGS Cervical spine is imaged from the skullbase through the T2-3 level. Vertebral body heights and alignment are maintained. No acute fracture traumatic subluxation is present. Straightening of the normal cervical lordosis is stable. Atherosclerotic calcifications are noted at the carotid bifurcations bilaterally. The carotid bifurcations are medial at the level of C2-3. The soft tissues of the neck are otherwise unremarkable. The lung apices are clear. Atherosclerotic calcifications are present at the aortic arch. IMPRESSION: 1. No acute intracranial abnormality or significant interval change. 2. No significant extracranial trauma. 3. Stable atrophy and white matter disease. 4. Multilevel spondylosis of the cervical spine without significant change. No acute fracture traumatic subluxation. 5. Atherosclerosis evident at the aortic arch and carotid bifurcations bilaterally. Electronically Signed   By: San Morelle M.D.   On: 06/23/2016 17:03   Ct Cervical Spine Wo Contrast  06/23/2016  CLINICAL DATA:  Fall hitting both front and back of her head. Persistent nausea, vomiting, and diarrhea. New onset headache. EXAM: CT HEAD WITHOUT CONTRAST CT CERVICAL SPINE WITHOUT CONTRAST TECHNIQUE: Multidetector CT imaging of the head and cervical spine was performed following the standard protocol without intravenous contrast.  Multiplanar CT image reconstructions of the cervical spine were also generated. COMPARISON:  CT of the head 06/16/2016. CT of the head and cervical spine 06/05/2016 FINDINGS: CT HEAD FINDINGS Mild generalized atrophy and white matter disease is stable. No acute cortical infarct, hemorrhage, or mass lesion is present. Basal ganglia are intact. Insular ribbon is normal. A remote right cerebellar infarct is stable. The brainstem and  cerebellum are otherwise unremarkable. No acute hemorrhage is present. No significant extracranial soft tissue trauma is evident. The calvarium is intact. 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 Atherosclerotic calcifications are present within the cavernous internal carotid arteries. Bilateral lens replacements are present. The globes and orbits are otherwise intact. CT CERVICAL SPINE FINDINGS Cervical spine is imaged from the skullbase through the T2-3 level. Vertebral body heights and alignment are maintained. No acute fracture traumatic subluxation is present. Straightening of the normal cervical lordosis is stable. Atherosclerotic calcifications are noted at the carotid bifurcations bilaterally. The carotid bifurcations are medial at the level of C2-3. The soft tissues of the neck are otherwise unremarkable. The lung apices are clear. Atherosclerotic calcifications are present at the aortic arch. IMPRESSION: 1. No acute intracranial abnormality or significant interval change. 2. No significant extracranial trauma. 3. Stable atrophy and white matter disease. 4. Multilevel spondylosis of the cervical spine without significant change. No acute fracture traumatic subluxation. 5. Atherosclerosis evident at the aortic arch and carotid bifurcations bilaterally. Electronically Signed   By: San Morelle M.D.   On: 06/23/2016 17:03    EKG:   Orders placed or performed during the hospital encounter of 06/23/16  . ED EKG  . ED EKG  . EKG 12-Lead  . EKG 12-Lead     IMPRESSION AND PLAN:   Michele Schultz  is a 82 y.o. female with Multiple medical problems including fibromyalgia, hypertension, CAD status post CABG, aortic stenosis, arthritis, spinal stenosis and inability to get up, GERD who was just discharged from the hospital 2 days ago comes back again with diarrhea and weakness.  #1 acute on chronic nausea/vomiting and diarrhea-has chronic nausea and vomiting. States it has worsened in the last few days. -Continue Reglan. CT of the abdomen and pelvis ordered. Requesting food now. We'll go ahead and start on a regular diet. -Stool for C. difficile has been ordered and pending. Added probiotics. If C. difficile is negative, will add Imodium.  #2 hypokalemia-being replaced. Likely secondary to her diarrhea  #3 fibromyalgia and arthritis-bedbound at baseline. Couldn't send her with you hoyer liftt due to nobody at home to help her out of bed. -Likely will need long-term placement. -Physical therapy and social worker consult. Continue home medications for pain  #4 hypertension-continue home medications. Patient on metoprolol, losartan and Norvasc  #6 DVT prophylaxis-started on Lovenox  Physical Therapy and social worker consults    All the records are reviewed and case discussed with ED provider. Management plans discussed with the patient, family and they are in agreement.  CODE STATUS: Full code  TOTAL TIME TAKING CARE OF THIS PATIENT: 50 minutes.    Gladstone Lighter M.D on 06/23/2016 at 6:55 PM  Between 7am to 6pm - Pager - [phone]  After 6pm go to [email or web address] - password [email or web address] [facility]  Office  [phone]  CC: Primary care physician; [name]

## 2016-06-23 NOTE — ED Notes (Signed)
Called to give report, secretary informed me that RN will call back asap.

## 2016-06-23 NOTE — Clinical Social Work Note (Signed)
Clinical Social Work Assessment  Patient Details  Name: Michele Schultz MRN: [id number] Date of Birth: Dec 24, 1930  Date of referral:  06/23/16               Reason for consult:  Facility Placement, Trauma                Permission sought to share information with:  Van granted to share information::  Yes, Verbal Permission Granted  Name::     All facilities for placement  Agency::  yes  Relationship::  yes  Contact Information:  All facilities  Housing/Transportation Living arrangements for the past 2 months:  [facility]) Source of Information:  Patient Patient Interpreter Needed:  None Criminal Activity/Legal Involvement Pertinent to Current Situation/Hospitalization:  No - Comment as needed Significant Relationships:  None Lives with:  Self Do you feel safe going back to the place where you live?  No Need for family participation in patient care:  No (Coment)  Care giving concerns:  I am unable to walk to bathroom and trip and fall in my own feces and urine   Social Worker assessment / plan: LCSW met with patient who reports she is having vommiting and diarhea. Oriented x4 ,  Lives a [place] for 3 years independent living. She had to drag herself to a phone and laid on  apartment floor for hours. She has no children and no support- previous nurse called and reported her situation to APS- She has no legal guardian and her 2 nieces [name] Knight/Phyliss Tillie Rung are involved in her care but have not really helped her due to their own busy life.She has a Church friend Tawana Scale who is trying to appeal her  deceased husbands insurance is no longer helping because of her own situation and only visits 1-2x month. Her vision is not that good, Hard of Hearing. Speech is clearl. LCSW called [facility] who will accept private pay 4100.00 unable to pay this amount. LCSW explained that she does not meet the  qualify period for medicare and will not likely be admitted. She was informed of private pay facilities patient became very emotional and was supported by LCSW. Patient reports she cant afford to pay for in home health 12 -15 hour. SSI-and Pension totals 2600.00 month. She is willing to consider family care home. Unable to return to family in [place]. 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  Emotional Assessment Appearance:  Appears stated age Attitude/Demeanor/Rapport:  Crying (Weeping, and distraught) Affect (typically observed):  Accepting, Frustrated, Calm Orientation:  Oriented to Self, Oriented to Place, Oriented to  Time, Oriented to Situation Alcohol / Substance use:  Never Used Psych involvement (Current and /or in the community):  No (Comment)  Discharge Needs  Concerns to be addressed:  Basic Needs, Coping/Stress Concerns Readmission within the last 30 days:  Yes Current discharge risk:  Physical Impairment (Reports unable to walk) Barriers to Discharge:  Inadequate or no insurance   [place], [place], [place] 06/23/2016, 5:33 PM

## 2016-06-23 NOTE — ED Notes (Signed)
Pt and linens changed out and pt cleaned.  In and out cath done for urine specimen.

## 2016-06-23 NOTE — Progress Notes (Signed)
Received a call from lab at 2024 that pt's stool sample is positive for C. Dif. On-call MD Hugelmeyer paged and made aware, ordered for enteric precaution isolation and to start oral Vancomycin. Will carry out orders and continue to monitor.

## 2016-06-23 NOTE — ED Notes (Signed)
[facility] called to pt. Home where she reports falling and hitting the front and back of her head on her bed. Pt. States she was seen the 14th for N/V/D and dizziness that has not resolved. Pt. Reporting pain in her head from the fall, and shoulder pain bilaterally related to her Fibromyalgia. Pt. Reporting nausea at this time. Pt. Reports unable to tolerate fluids or food, "it just shoots right out" (referring to immediate diarrhea).

## 2016-06-23 NOTE — ED Provider Notes (Signed)
[facility] Emergency Department Provider Note  ____________________________________________  Time seen: Approximately 3:37 PM  I have reviewed the triage vital signs and the nursing notes.   HISTORY  Chief Complaint Nausea; Emesis; Dizziness; and Fall   HPI Michele Schultz is a 82 y.o. female history of hypertension, fibromyalgia, CAD status post CABG, aortic stenosis status post replacement in 2012, osteoarthritis, diabetes, diastolic CHF, complete heart block status post pacemaker, morbidly obesity, and paroxysmal atrial fibrillation who presents for evaluation of lightheadedness and multiple falls in the setting of vomiting and diarrhea. Patient was discharged from the hospital 3 days ago when she presented similarly. She reports that for the last 3 weeks she has had multiple episodes of watery diarrhea "every time I eat" and vomiting. She does have a history of chronic vomiting which has been attributed to gastroparesis but she reports that the vomiting is more frequent and that she has been unable to tolerate PO. She reports that she feels so weak and dizzy that she has had multiple falls. She states since going home patient has had 3 falls. She reports she fell twice yesterday in the bathroom and 1 this morning in her room. She hit her head on the floor several times. She lives alone and has a walker however reports since going home from the hospital she is only been able to go from the bed to the bathroom and back due to her dizziness. She denies syncope, chest pain, shortness of breath, HA, fever, dysuria. She does endorse abdominal cramping that is mild, intermittent, generalized, nonradiating, that resolves when she has a bowel movement. She denies melena or blood in her stool or emesis. Patient denies prior history of C. difficile. She does endorse her stool is foul smelling. She is not on anticoagulation.  Past Medical History  Diagnosis Date  .  Hypertensive heart disease   . Anemia   . Neuralgia, neuritis, and radiculitis, unspecified   . Coronary artery disease     a. s/p 2v CABG 11/12 (VG-LAD, VG-OM1); b. Lexiscan 08/2015: low risk, no ischemia, EF 55-65%.  . Aortic stenosis, severe     a. s/p Magna Ease pericardial tissue valve size 21 mm replacement in 10/2011 for severe AS 11/12; b. echo 07/2015: EF 55-60% mod concentric LVH, GR1DD, LA mildly dilated, PASP 45 mm Hg  . Onychia and paronychia of toe   . Osteoarthrosis, unspecified whether generalized or localized, pelvic region and thigh     mainly in her back and knees  . Enthesopathy of hip region   . Esophageal reflux     followed by Dr.Seigal. stabilized with a combination of Nexium and Zantac  . Knee joint replacement by other means   . Stress incontinence, female     followed by Dr.Cope  . Complete heart block [facility])     a. s/p St Jude PPM 10/2011 (Ser # [id number]).  . Cardiac pacemaker -st Judes     11/12  . Chronic diastolic heart failure ([place])     a. echo 2014: EF 55-60%, no RWMA, GR1DD, PASP 47 mm Hg; b. echo 07/2015: EF 55-60% mod concentric LVH, GR1DD, LA mildly dilated, PASP 45 mm Hg  . Type II or unspecified type diabetes mellitus without mention of complication, not stated as uncontrolled     a. pt. reports that she is borderline   . Spinal stenosis   . Neuropathy ([place])   . Morbid obesity ([place])   . RAD (reactive airway disease)  a. chronic SOB  . Fibromyalgia   . HLD (hyperlipidemia)   . PAF (paroxysmal atrial fibrillation) ([place])     a. brief episodes of AF previously noted on device interrogations.  . Wide-complex tachycardia ([place])     a. Noted 4/10 - brief episode in ED. Noted again 4/21 in clinic->felt most likely to be atrial tach.    Patient Active Problem List   Diagnosis Date Noted  . Diarrhea 06/23/2016  . Hypertensive heart disease   . Wide-complex tachycardia ([place])   . Pain in the chest   . Emesis   . Weakness of both legs   . Coronary  artery disease involving coronary bypass graft of native heart with unspecified angina pectoris   . S/P AVR (aortic valve replacement)   . Falls frequently   . HLD (hyperlipidemia)   . Fibromyalgia   . RAD (reactive airway disease)   . Morbid obesity ([place])   . Aortic stenosis, severe   . Chronic pain 06/17/2014  . Pain in the muscles 06/19/2013  . Chest pain 06/19/2013  . Wheezing 06/19/2013  . Generalized weakness 06/03/2013  . Dyspnea 01/27/2013  . Nausea 03/05/2012  . Constipation 03/05/2012  . Atrial fibrillation-non sustained 02/09/2012  . Cardiac pacemaker -st Judes   . Chronic diastolic heart failure ([place])   . Coronary artery disease   . Long term (current) use of anticoagulants 01/03/2012  . S/P CABG x 2 11/14/2011  . S/P aortic valve replacement with porcine valve 11/14/2011  . Back pain 11/14/2011  . Complete heart block ([place]) 10/20/2011  . HTN (hypertension) 09/15/2011  . Spinal stenosis 03/22/2011  . Fatigue 03/22/2011  . Hyperlipidemia 03/22/2011    Past Surgical History  Procedure Laterality Date  . Replacement total knee  11/2006    right knee  . Nasal sinus surgery  2009  . Total hip arthroplasty  09/2010  . Cardiac catheterization  08/2009    50% stenosis distal left main, 50% stenosis ostial left circumflex.   . Cardiac catheterization      at [facility]  . Eye surgery      IOL/ after cataracts removed- bilateral   . Aortic valve replacement  10/17/2011    Procedure: AORTIC VALVE REPLACEMENT (AVR);  Surgeon: Melrose Nakayama, MD;  Location: [facility];  Service: Open Heart Surgery;  Laterality: N/A;  . Coronary artery bypass graft  10/17/2011    Procedure: CORONARY ARTERY BYPASS GRAFTING (CABG);  Surgeon: Melrose Nakayama, MD;  Location: [facility];  Service: Open Heart Surgery;  Laterality: N/A;  Times Two, using right leg greater saphenous vein harvested endoscopically  . Permanent pacemaker insertion N/A 10/20/2011    Procedure: PERMANENT PACEMAKER  INSERTION;  Surgeon: Thompson Grayer, MD;  Location[facility] CATH LAB;  Service: Cardiovascular;  Laterality: N/A;    No current outpatient prescriptions on file.  Allergies Citalopram; Cymbalta; Imipramine; Proton pump inhibitors; and Venlafaxine  Family History  Problem Relation Age of Onset  . Diabetes Other   . Heart disease Sister   . Heart disease Brother   . Heart disease Brother   . Heart disease Brother   . Anesthesia problems Neg Hx   . Hypotension Neg Hx   . Malignant hyperthermia Neg Hx   . Pseudochol deficiency Neg Hx     Social History Social History  Substance Use Topics  . Smoking status: Never Smoker   . Smokeless tobacco: Never Used  . Alcohol Use: No    Review of Systems  Constitutional:  Negative for fever. + Generalized weakness and dizziness Eyes: Negative for visual changes. ENT: Negative for sore throat. Cardiovascular: Negative for chest pain. Respiratory: Negative for shortness of breath. Gastrointestinal: + Abdominal cramping, vomiting, diarrhea  Genitourinary: Negative for dysuria. Musculoskeletal: Negative for back pain. Skin: Negative for rash. Neurological: Negative for headaches, weakness or numbness.  ____________________________________________   PHYSICAL EXAM:  VITAL SIGNS: ED Triage Vitals  Enc Vitals Group     BP 06/23/16 1431 139/103 mmHg     Pulse Rate 06/23/16 1431 93     Resp 06/23/16 1431 18     Temp 06/23/16 1431 98.7 F (37.1 C)     Temp Source 06/23/16 1431 Oral     SpO2 06/23/16 1419 97 %     Weight 06/23/16 1431 240 lb (108.863 kg)     Height 06/23/16 1431 5\' 4"  (1.626 m)     Head Cir --      Peak Flow --      Pain Score 06/23/16 1421 4     Pain Loc --      Pain Edu? --      Excl. in [place]? --     Constitutional: Alert and oriented, no distress. Patient incontinent of stool in the bed. HEENT:      Head: Normocephalic and atraumatic.         Eyes: Conjunctivae are normal. Sclera is non-icteric. EOMI. PERRL       Mouth/Throat: Mucous membranes are moist.       Neck: Supple with no signs of meningismus. Cardiovascular: Regular rate and rhythm. No murmurs, gallops, or rubs. 2+ symmetrical distal pulses are present in all extremities. No JVD. Respiratory: Normal respiratory effort. Lungs are clear to auscultation bilaterally. No wheezes, crackles, or rhonchi.  Gastrointestinal: Obese, non tender, and non distended with positive bowel sounds. No rebound or guarding. Watery yellow stool, hemoccult negative. Genitourinary: No CVA tenderness. Musculoskeletal: Nontender with normal range of motion in all extremities. No edema, cyanosis, or erythema of extremities. Neurologic: Normal speech and language. Face is symmetric. Moving all extremities. No gross focal neurologic deficits are appreciated. Skin: Skin is warm, dry and intact. No rash noted. Psychiatric: Mood and affect are normal. Speech and behavior are normal.  ____________________________________________   LABS (all labs ordered are listed, but only abnormal results are displayed)  Labs Reviewed  C DIFFICILE QUICK SCREEN W PCR REFLEX - Abnormal; Notable for the following:    C Diff antigen POSITIVE (*)    All other components within normal limits  CLOSTRIDIUM DIFFICILE BY PCR - Abnormal; Notable for the following:    Toxigenic C Difficile by pcr POSITIVE (*)    All other components within normal limits  COMPREHENSIVE METABOLIC PANEL - Abnormal; Notable for the following:    Potassium 3.3 (*)    Glucose, Bld 206 (*)    AST 42 (*)    All other components within normal limits  CBC - Abnormal; Notable for the following:    RDW 16.7 (*)    All other components within normal limits  URINALYSIS COMPLETEWITH MICROSCOPIC ([facility] ONLY) - Abnormal; Notable for the following:    Color, Urine YELLOW (*)    APPearance CLEAR (*)    Glucose, UA 50 (*)    Hgb urine dipstick 2+ (*)    Protein, ur 30 (*)    Squamous Epithelial / LPF 0-5 (*)    All other  components within normal limits  BASIC METABOLIC PANEL - Abnormal; Notable for the following:  Glucose, Bld 165 (*)    Calcium 8.7 (*)    All other components within normal limits  CBC - Abnormal; Notable for the following:    Hemoglobin 11.6 (*)    HCT 34.6 (*)    RDW 16.9 (*)    All other components within normal limits  GASTROINTESTINAL PANEL BY PCR, STOOL (REPLACES STOOL CULTURE)  URINE CULTURE  LIPASE, BLOOD  TROPONIN I  MAGNESIUM   ____________________________________________  EKG  ED ECG REPORT I, Rudene Re, the attending physician, personally viewed and interpreted this ECG.  Sinus rhythm, left bundle branch block, rate of 90, prolonged QTC of 519, normal axis, T-wave inversions on inferior lateral leads. Unchanged from prior from April 2017.  ____________________________________________  RADIOLOGY  None  ____________________________________________   PROCEDURES  Procedure(s) performed: None Critical Care performed:  None ____________________________________________   INITIAL IMPRESSION / ASSESSMENT AND PLAN / ED COURSE  82 y.o. female history of hypertension, fibromyalgia, CAD status post CABG, aortic stenosis status post replacement in 2012, osteoarthritis, diabetes, diastolic CHF, complete heart block status post pacemaker, morbidly obesity, and paroxysmal atrial fibrillation who presents for evaluation of lightheadedness and multiple falls in the setting of vomiting and diarrhea x 3 weeks. Recently discharged 3 days ago. Patient has had 3 falls over the last 3 days. She is incontinent of stool in the bed which is watery, yellow, Hemoccult negative. Her physical exam is otherwise unremarkable. Patient lives alone and is barely been able to walk at home since being discharged due to her generalized dizziness and multiple falls. Her labs are within normal limits with normal kidney function. Stool was sent for C. difficile evaluation. I'm concerned that if  patient gets discharged home she was up hurting herself with these multiple falls. Patient might need placement therefore we'll discuss with the hospitalist for admission. We'll check a head CT and CT cervical spine.  ED course: Patient evaluated by Claudine, social worker however unable to find placement at this time from the ED. Due to severity of diarrhea, concerns for severely weakened patient with multiple falls, will admit for hydration and hopefully placement. C. Diff is pending.  Pertinent labs & imaging results that were available during my care of the patient were reviewed by me and considered in my medical decision making (see chart for details).    ____________________________________________   FINAL CLINICAL IMPRESSION(S) / ED DIAGNOSES  Final diagnoses:  Vomiting and diarrhea  Inability to walk      NEW MEDICATIONS STARTED DURING THIS VISIT:  Current Discharge Medication List       Note:  This document was prepared using Dragon voice recognition software and may include unintentional dictation errors.    Rudene Re, MD 06/24/16 1051

## 2016-06-23 NOTE — NC FL2 (Signed)
[place] LEVEL OF CARE SCREENING TOOL     IDENTIFICATION  Patient Name: Michele Schultz Birthdate: 15-Nov-1931 Sex: female Admission Date (Current Location): 06/23/2016  [place] and [place] Number:  Engineering geologist and Address:  [facility], [address], [place], [place] [address]      Provider Number: [id number]  Attending Physician Name and Address:  Rudene Re, MD  Relative Name and Phone Number:       Current Level of Care: SNF Recommended Level of Care: [place] Prior Approval Number:    Date Approved/Denied:   PASRR Number:  [id number]  Discharge Plan: SNF    Current Diagnoses: Patient Active Problem List   Diagnosis Date Noted  . Hypertensive heart disease   . Wide-complex tachycardia ([place])   . Pain in the chest   . Emesis   . Weakness of both legs   . Coronary artery disease involving coronary bypass graft of native heart with unspecified angina pectoris   . S/P AVR (aortic valve replacement)   . Falls frequently   . HLD (hyperlipidemia)   . Fibromyalgia   . RAD (reactive airway disease)   . Morbid obesity ([place])   . Aortic stenosis, severe   . Chronic pain 06/17/2014  . Pain in the muscles 06/19/2013  . Chest pain 06/19/2013  . Wheezing 06/19/2013  . Generalized weakness 06/03/2013  . Dyspnea 01/27/2013  . Nausea 03/05/2012  . Constipation 03/05/2012  . Atrial fibrillation-non sustained 02/09/2012  . Cardiac pacemaker -st Judes   . Chronic diastolic heart failure ([place])   . Coronary artery disease   . Long term (current) use of anticoagulants 01/03/2012  . S/P CABG x 2 11/14/2011  . S/P aortic valve replacement with porcine valve 11/14/2011  . Back pain 11/14/2011  . Complete heart block ([place]) 10/20/2011  . HTN (hypertension) 09/15/2011  . Spinal stenosis 03/22/2011  . Fatigue 03/22/2011  . Hyperlipidemia 03/22/2011    Orientation RESPIRATION BLADDER  Height & Weight     Self, Time, Situation, Place  Normal Continent Weight: 240 lb (108.863 kg) Height:  5\' 4"  (162.6 cm)  BEHAVIORAL SYMPTOMS/MOOD NEUROLOGICAL BOWEL NUTRITION STATUS      Continent Diet (diabetic)  AMBULATORY STATUS COMMUNICATION OF NEEDS Skin   Extensive Assist Verbally Normal                       Personal Care Assistance Level of Assistance  Bathing, Feeding, Dressing, Total care Bathing Assistance: Maximum assistance Feeding assistance: Independent Dressing Assistance: Limited assistance Total Care Assistance: Limited assistance   Functional Limitations Info    Sight Info: Adequate Hearing Info: Impaired (Hard of hearing) Speech Info: Adequate    SPECIAL CARE FACTORS FREQUENCY  OT (By licensed OT)     PT Frequency: 5x week OT Frequency: 3x week            Contractures      Additional Factors Info  Allergies   Allergies Info: Citalopam,cymbalta,Impramine,Proton Pump Inhibitors, Venlafaxine           Current Medications (06/23/2016):  This is the current hospital active medication list No current facility-administered medications for this encounter.   Current Outpatient Prescriptions  Medication Sig Dispense Refill  . albuterol-ipratropium (COMBIVENT) 18-103 MCG/ACT inhaler Inhale 2 puffs into the lungs every 6 (six) hours as needed for wheezing. 1 Inhaler 4  . amLODipine (NORVASC) 5 MG tablet Take 1 tablet (5 mg total) by  mouth daily. 30 tablet 6  . aspirin EC 81 MG tablet Take 81 mg by mouth daily.    Marland Kitchen azelastine (ASTELIN) 0.1 % nasal spray Place 1 spray into both nostrils as needed for rhinitis.     Marland Kitchen benzonatate (TESSALON) 100 MG capsule Take 200 mg by mouth 3 (three) times daily as needed for cough.     . cyanocobalamin (,VITAMIN B-12,) 1000 MCG/ML injection Inject 1,000 mcg into the muscle every 30 (thirty) days.    . cycloSPORINE (RESTASIS) 0.05 % ophthalmic emulsion Place 1 drop into both eyes 2 (two) times daily.     .  furosemide (LASIX) 20 MG tablet Take 1 tablet (20 mg total) by mouth every other day. 30 tablet 6  . gabapentin (NEURONTIN) 300 MG capsule Take 1 capsule (300 mg total) by mouth 2 (two) times daily. 60 capsule 2  . HYDROcodone-acetaminophen (NORCO) 10-325 MG tablet Take 1 tablet by mouth every 4 (four) hours as needed for moderate pain. Reported on 05/30/2016    . losartan (COZAAR) 25 MG tablet Take 25 mg by mouth daily.    . magnesium hydroxide (MILK OF MAGNESIA) 400 MG/5ML suspension Take 15 mLs by mouth daily as needed for mild constipation.     . metoCLOPramide (REGLAN) 10 MG tablet Take 1 tablet (10 mg total) by mouth every 8 (eight) hours as needed for nausea or vomiting. 30 tablet 1  . metoprolol succinate (TOPROL-XL) 50 MG 24 hr tablet Take 1 tablet (50 mg total) by mouth daily. 30 tablet 2  . nitroGLYCERIN (NITROSTAT) 0.4 MG SL tablet Place 1 tablet (0.4 mg total) under the tongue every 5 (five) minutes as needed for chest pain. 90 tablet 3  . pantoprazole (PROTONIX) 40 MG tablet Take 40 mg by mouth 2 (two) times daily.      . phenazopyridine (PYRIDIUM) 200 MG tablet Take 1 tablet (200 mg total) by mouth 3 (three) times daily as needed for pain. 20 tablet 0  . potassium chloride (K-DUR) 10 MEQ tablet Take 10 mEq by mouth daily.    . promethazine (PHENERGAN) 25 MG tablet Take 1 tablet (25 mg total) by mouth every 6 (six) hours as needed for nausea or vomiting. 30 tablet 0  . traMADol (ULTRAM) 50 MG tablet Take 1 tablet (50 mg total) by mouth every 6 (six) hours as needed. 20 tablet 0     Discharge Medications: Please see discharge summary for a list of discharge medications.  Relevant Imaging Results:  Relevant Lab Results:   Additional Information [id number]  Joana Reamer, [place]

## 2016-06-24 DIAGNOSIS — A047 Enterocolitis due to Clostridium difficile: Secondary | ICD-10-CM | POA: Diagnosis not present

## 2016-06-24 LAB — BASIC METABOLIC PANEL
ANION GAP: 7 (ref 5–15)
BUN: 12 mg/dL (ref 6–20)
CALCIUM: 8.7 mg/dL — AB (ref 8.9–10.3)
CO2: 27 mmol/L (ref 22–32)
Chloride: 105 mmol/L (ref 101–111)
Creatinine, Ser: 0.59 mg/dL (ref 0.44–1.00)
GFR calc non Af Amer: >60 mL/min (ref >60)
Glucose, Bld: 165 mg/dL — ABNORMAL HIGH (ref 65–99)
Potassium: 3.9 mmol/L (ref 3.5–5.1)
SODIUM: 139 mmol/L (ref 135–145)

## 2016-06-24 LAB — CBC
HCT: 34.6 % — ABNORMAL LOW (ref 35.0–47.0)
HEMOGLOBIN: 11.6 g/dL — AB (ref 12.0–16.0)
MCH: 28 pg (ref 26.0–34.0)
MCHC: 33.4 g/dL (ref 32.0–36.0)
MCV: 83.8 fL (ref 80.0–100.0)
PLATELETS: 232 10*3/uL (ref 150–440)
RBC: 4.13 MIL/uL (ref 3.80–5.20)
RDW: 16.9 % — ABNORMAL HIGH (ref 11.5–14.5)
WBC: 6.6 10*3/uL (ref 3.6–11.0)

## 2016-06-24 MED ORDER — TUBERCULIN PPD 5 UNIT/0.1ML ID SOLN
5.0000 [IU] | Freq: Once | INTRADERMAL | Status: AC
  Administered 2016-06-24: 5 [IU] via INTRADERMAL
  Filled 2016-06-24: qty 0.1

## 2016-06-24 NOTE — Care Management Note (Signed)
Case Management Note  Patient Details  Name: BRYTTNEY AUGUST MRN: [id number] Date of Birth: [birth year]  Subjective/Objective:   82yo Ms Danett Texter was last discharged back to [place] from [facility] on 06/21/16.  On 06/23/16 she was brought to the [facility]-ED via EMS after she fell at home, also c/o diarrhea. She lives alone. Family was supposed to be working with her to apply for Medicaid since last April. PCP=[facility]. Pharmacy=[facility]. Is currently open to [facility] for [place]. Home equipment includes a rolliator, shower chair, BSC. No home oxygen. [facility]SW is pursuing placement options at either ALF or SNF. Ardeen Fillers at [facility] has been notified of this admission and that placement is being pursued. Case management will continue to follow in the event that placement other than return to [facility] is not an option.               Action/Plan:   Expected Discharge Date:                  Expected Discharge Plan:     In-House Referral:     Discharge planning Services     Post Acute Care Choice:    Choice offered to:     DME Arranged:    DME Agency:     HH Arranged:    HH Agency:     Status of Service:     If discussed at [employer] of Stay Meetings, dates discussed:    Additional Comments:  Margarito Dehaas A, RN 06/24/2016, 10:36 AM

## 2016-06-24 NOTE — Progress Notes (Signed)
Physical Therapy Evaluation Patient Details Name: Michele Schultz MRN: [id number] DOB: Sep 26, 1931 Today's Date: 06/24/2016   History of Present Illness  Patient is an 82 y.o. female admitted on 23 June 2016 after discharging to [facility] 2 days ago. Presents with diarrhea. PMH includes fibromyalgia, HTN, CAD s/p CABG, aortic stenosis, spinal stenosis, and GERD.  Clinical Impression  Patient is a pleasant 82 y.o. Female who presented to hospital on 21 July after being discharged 2 days prior. Patient describes experiencing frequent falls, secondary to dizziness. Is currently living alone at [place], reporting that her husband passed in recent months past. Patient reports wanting to move to ALF, knowing she requires more assistance. On evaluation, patient demonstrates significant weakness, generally 3 to 3+/5 MMT in LEs. Required moderate to maximal assistance to perform bed mobility, complaining of worsening dizziness with each change in position. May benefit from orthostatic BP assessment. Demonstrated poor and fair sitting and standing balance, respectively. Unable to assess gait due to increased dizziness. Patient will continue to benefit from progressive, skilled PT to improve strength, balance, and function, allowing her to return to church activities when appropriate. Patient will also benefit from further rehabilitation at SNF at discharge to decrease level of assistance required for daily mobility.    Follow Up Recommendations SNF    Equipment Recommendations  Rolling walker with 5" wheels    Recommendations for Other Services       Precautions / Restrictions Precautions Precautions: Fall Restrictions Weight Bearing Restrictions: No      Mobility  Bed Mobility Overal bed mobility: Needs Assistance Bed Mobility: Supine to Sit;Sit to Supine     Supine to sit: Mod assist;HOB elevated Sit to supine: Max assist;HOB elevated   General bed mobility comments: Patient  requires moderate to maximal assistance to perform bed mobility. Modifications such as elevated HOB and overhead trapeze utilized to allow for improved independence.  Transfers Overall transfer level: Needs assistance Equipment used: Rolling walker (2 wheeled) Transfers: Sit to/from Stand Sit to Stand: Min assist;From elevated surface         General transfer comment: Patient requires minimal assistance to stand but is unable to maintain balance, complaining of dizziness. Requested to sit.  Ambulation/Gait             General Gait Details: Unable to assess due to dizziness  Stairs            Wheelchair Mobility    Modified Rankin (Stroke Patients Only)       Balance Overall balance assessment: History of Falls Sitting-balance support: Bilateral upper extremity supported;Feet supported Sitting balance-Leahy Scale: Fair Sitting balance - Comments: With increased time sitting at EOB, patient began to lose balance backwards   Standing balance support: Bilateral upper extremity supported Standing balance-Leahy Scale: Poor Standing balance comment: Unable to maintain 10 seconds                             Pertinent Vitals/Pain Pain Assessment: 0-10 Pain Score: 8  Pain Location: Everywhere Pain Descriptors / Indicators: Aching Pain Intervention(s): Limited activity within patient's tolerance;Monitored during session    Home Living Family/patient expects to be discharged to:: Assisted living               Home Equipment: Bedside commode;Shower seat;Walker - 4 wheels;Wheelchair - manual      Prior Function Level of Independence: Needs assistance   Gait / Transfers Assistance Needed: Patient reports utilizing  rollator and wheelchair at home.  ADL's / Homemaking Assistance Needed: Performed by nurse aide who visits 2 hours/day        Hand Dominance        Extremity/Trunk Assessment   Upper Extremity Assessment: Overall WFL for tasks  assessed (Complains of R shoulder pain from recent fall)           Lower Extremity Assessment: Generalized weakness         Communication   Communication: No difficulties  Cognition Arousal/Alertness: Awake/alert Behavior During Therapy: WFL for tasks assessed/performed Overall Cognitive Status: Within Functional Limits for tasks assessed                      General Comments      Exercises General Exercises - Lower Extremity Ankle Circles/Pumps: AAROM;10 reps Quad Sets: AROM;Strengthening;10 reps Gluteal Sets: AROM;10 reps;Strengthening Long Arc Quad: AROM;5 reps;Strengthening Hip ABduction/ADduction: AAROM;Strengthening;10 reps Hip Flexion/Marching: AROM;Strengthening;5 reps;Seated      Assessment/Plan    PT Assessment Patient needs continued PT services  PT Diagnosis Difficulty walking;Generalized weakness   PT Problem List Decreased strength;Decreased activity tolerance;Decreased balance;Decreased mobility;Decreased knowledge of use of DME;Decreased safety awareness;Pain;Obesity  PT Treatment Interventions DME instruction;Gait training;Functional mobility training;Therapeutic activities;Therapeutic exercise;Balance training;Patient/family education   PT Goals (Current goals can be found in the Care Plan section) Acute Rehab PT Goals Patient Stated Goal: "To return to church activities" PT Goal Formulation: With patient Time For Goal Achievement: 07/08/16 Potential to Achieve Goals: Fair    Frequency Min 2X/week   Barriers to discharge Decreased caregiver support      Co-evaluation               End of Session Equipment Utilized During Treatment: Gait belt Activity Tolerance: Other (comment) (Patient limited by dizziness) Patient left: in bed;with call bell/phone within reach;with bed alarm set;with SCD's reapplied      Functional Assessment Tool Used: Clinical Judgement Functional Limitation: Mobility: Walking and moving  around Mobility: Walking and Moving Around Current Status [id number]): At least 80 percent but less than 100 percent impaired, limited or restricted Mobility: Walking and Moving Around Goal Status [phone]): At least 60 percent but less than 80 percent impaired, limited or restricted    Time: 1050-1115 PT Time Calculation (min) (ACUTE ONLY): 25 min   Charges:   PT Evaluation $PT Eval Low Complexity: 1 Procedure PT Treatments $Therapeutic Exercise: 8-22 mins   PT G Codes:   PT G-Codes **NOT FOR INPATIENT CLASS** Functional Assessment Tool Used: Clinical Judgement Functional Limitation: Mobility: Walking and moving around Mobility: Walking and Moving Around Current Status [id number]): At least 80 percent but less than 100 percent impaired, limited or restricted Mobility: Walking and Moving Around Goal Status [phone]): At least 60 percent but less than 80 percent impaired, limited or restricted    Dorice Lamas, PT, DPT 06/24/2016, 12:03 PM

## 2016-06-24 NOTE — Progress Notes (Signed)
New Admission Note:   Arrival Method: per bed from ED, pt came from [facility] Orientation: alert and oriented X4 Telemetry: none ordered Assessment: Completed Skin: warm, dry, with ecchymosis noted on the right abdomen, pt stated it was from previous Heparin shots. No wounds noted, sacral foam initiated as preventative measure. IV: G22 on the left hand with transparent dressing, intact. Pain: denies any pain as of this time Safety Measures: Safety Fall Prevention Plan has been given and discussed Admission: Completed 1A Orientation: Patient has been oriented to the room, unit and staff.  Family: pt lives alone, no significant other at bedside  Orders have been reviewed and implemented. Will continue to monitor the patient. Call light has been placed within reach and bed alarm has been activated.   Georgeanna Harrison BSN, RN [facility] 1A

## 2016-06-24 NOTE — Progress Notes (Signed)
[facility] [place] NAME: Michele Schultz    MR#:  [id number]  DATE OF BIRTH:  04/27/1931  SUBJECTIVE:   Patient doing ok this am diarrhea has improved Nausea overnight. She is feeling weak.  REVIEW OF SYSTEMS:    Review of Systems  Constitutional: Negative for fever, chills and malaise/fatigue.  HENT: Negative for ear discharge, ear pain, hearing loss, nosebleeds and sore throat.   Eyes: Negative for blurred vision and pain.  Respiratory: Negative for cough, hemoptysis, shortness of breath and wheezing.   Cardiovascular: Negative for chest pain, palpitations and leg swelling.  Gastrointestinal: Positive for diarrhea (improved). Negative for nausea, vomiting, abdominal pain and blood in stool.  Genitourinary: Negative for dysuria.  Musculoskeletal: Negative for back pain.  Neurological: Positive for weakness. Negative for dizziness, tremors, speech change, focal weakness, seizures and headaches.  Endo/Heme/Allergies: Does not bruise/bleed easily.  Psychiatric/Behavioral: Negative for depression, suicidal ideas and hallucinations.    Tolerating Diet: She will try breakfast morning      DRUG ALLERGIES:   Allergies  Allergen Reactions  . Citalopram Other (See Comments)    Reaction:  Altered mental status   . Cymbalta [Duloxetine Hcl] Other (See Comments)    Reaction:  Sedative for pt   . Imipramine Other (See Comments)    Reaction:  Unknown   . Proton Pump Inhibitors Other (See Comments)    Reaction:  Unknown   . Venlafaxine Nausea And Vomiting and Other (See Comments)    Reaction:  Dizziness     VITALS:  Blood pressure 132/80, pulse 72, temperature 98 F (36.7 C), temperature source Oral, resp. rate 20, height 5\' 4"  (1.626 m), weight 108.863 kg (240 lb), SpO2 100 %.  PHYSICAL EXAMINATION:   Physical Exam  Constitutional: She is oriented to person, place, and time and well-developed, well-nourished, and in no distress. No  distress.  HENT:  Head: Normocephalic.  Eyes: No scleral icterus.  Neck: Normal range of motion. Neck supple. No JVD present. No tracheal deviation present.  Cardiovascular: Normal rate, regular rhythm and normal heart sounds.  Exam reveals no gallop and no friction rub.   No murmur heard. Pulmonary/Chest: Effort normal and breath sounds normal. No respiratory distress. She has no wheezes. She has no rales. She exhibits no tenderness.  Abdominal: Soft. Bowel sounds are normal. She exhibits no distension and no mass. There is no tenderness. There is no rebound and no guarding.  Musculoskeletal: Normal range of motion. She exhibits no edema.  Neurological: She is alert and oriented to person, place, and time.  Skin: Skin is warm. No rash noted. No erythema.  Psychiatric: Affect and judgment normal.      LABORATORY PANEL:   CBC  Recent Labs Lab 06/24/16 0506  WBC 6.6  HGB 11.6*  HCT 34.6*  PLT 232   ------------------------------------------------------------------------------------------------------------------  Chemistries   Recent Labs Lab 06/23/16 1449 06/24/16 0506  NA 140 139  K 3.3* 3.9  CL 104 105  CO2 24 27  GLUCOSE 206* 165*  BUN 13 12  CREATININE 0.72 0.59  CALCIUM 9.4 8.7*  MG 1.9  --   AST 42*  --   ALT 31  --   ALKPHOS 91  --   BILITOT 0.6  --    ------------------------------------------------------------------------------------------------------------------  Cardiac Enzymes  Recent Labs Lab 06/23/16 1449  TROPONINI <0.03   ------------------------------------------------------------------------------------------------------------------  RADIOLOGY:  Ct Head Wo Contrast  06/23/2016  CLINICAL DATA:  Fall hitting both front and  back of her head. Persistent nausea, vomiting, and diarrhea. New onset headache. EXAM: CT HEAD WITHOUT CONTRAST CT CERVICAL SPINE WITHOUT CONTRAST TECHNIQUE: Multidetector CT imaging of the head and cervical spine was  performed following the standard protocol without intravenous contrast. Multiplanar CT image reconstructions of the cervical spine were also generated. COMPARISON:  CT of the head 06/16/2016. CT of the head and cervical spine 06/05/2016 FINDINGS: CT HEAD FINDINGS Mild generalized atrophy and white matter disease is stable. No acute cortical infarct, hemorrhage, or mass lesion is present. Basal ganglia are intact. Insular ribbon is normal. A remote right cerebellar infarct is stable. The brainstem and cerebellum are otherwise unremarkable. No acute hemorrhage is present. No significant extracranial soft tissue trauma is evident. The calvarium is intact. 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 Atherosclerotic calcifications are present within the cavernous internal carotid arteries. Bilateral lens replacements are present. The globes and orbits are otherwise intact. CT CERVICAL SPINE FINDINGS Cervical spine is imaged from the skullbase through the T2-3 level. Vertebral body heights and alignment are maintained. No acute fracture traumatic subluxation is present. Straightening of the normal cervical lordosis is stable. Atherosclerotic calcifications are noted at the carotid bifurcations bilaterally. The carotid bifurcations are medial at the level of C2-3. The soft tissues of the neck are otherwise unremarkable. The lung apices are clear. Atherosclerotic calcifications are present at the aortic arch. IMPRESSION: 1. No acute intracranial abnormality or significant interval change. 2. No significant extracranial trauma. 3. Stable atrophy and white matter disease. 4. Multilevel spondylosis of the cervical spine without significant change. No acute fracture traumatic subluxation. 5. Atherosclerosis evident at the aortic arch and carotid bifurcations bilaterally. Electronically Signed   By: San Morelle M.D.   On: 06/23/2016 17:03   Ct Cervical Spine Wo Contrast  06/23/2016  CLINICAL DATA:  Fall hitting  both front and back of her head. Persistent nausea, vomiting, and diarrhea. New onset headache. EXAM: CT HEAD WITHOUT CONTRAST CT CERVICAL SPINE WITHOUT CONTRAST TECHNIQUE: Multidetector CT imaging of the head and cervical spine was performed following the standard protocol without intravenous contrast. Multiplanar CT image reconstructions of the cervical spine were also generated. COMPARISON:  CT of the head 06/16/2016. CT of the head and cervical spine 06/05/2016 FINDINGS: CT HEAD FINDINGS Mild generalized atrophy and white matter disease is stable. No acute cortical infarct, hemorrhage, or mass lesion is present. Basal ganglia are intact. Insular ribbon is normal. A remote right cerebellar infarct is stable. The brainstem and cerebellum are otherwise unremarkable. No acute hemorrhage is present. No significant extracranial soft tissue trauma is evident. The calvarium is intact. 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 Atherosclerotic calcifications are present within the cavernous internal carotid arteries. Bilateral lens replacements are present. The globes and orbits are otherwise intact. CT CERVICAL SPINE FINDINGS Cervical spine is imaged from the skullbase through the T2-3 level. Vertebral body heights and alignment are maintained. No acute fracture traumatic subluxation is present. Straightening of the normal cervical lordosis is stable. Atherosclerotic calcifications are noted at the carotid bifurcations bilaterally. The carotid bifurcations are medial at the level of C2-3. The soft tissues of the neck are otherwise unremarkable. The lung apices are clear. Atherosclerotic calcifications are present at the aortic arch. IMPRESSION: 1. No acute intracranial abnormality or significant interval change. 2. No significant extracranial trauma. 3. Stable atrophy and white matter disease. 4. Multilevel spondylosis of the cervical spine without significant change. No acute fracture traumatic subluxation. 5.  Atherosclerosis evident at the aortic arch and carotid bifurcations  bilaterally. Electronically Signed   By: San Morelle M.D.   On: 06/23/2016 17:03   Ct Abdomen Pelvis W Contrast  06/23/2016  CLINICAL DATA:  Nausea, vomiting, diarrhea, and weakness. EXAM: CT ABDOMEN AND PELVIS WITH CONTRAST TECHNIQUE: Multidetector CT imaging of the abdomen and pelvis was performed using the standard protocol following bolus administration of intravenous contrast. CONTRAST:  168mL ISOVUE-300 IOPAMIDOL (ISOVUE-300) INJECTION 61% COMPARISON:  04/15/2016 FINDINGS: Mild scarring and subpleural reticulation at the lung bases are similar to the prior study. No pleural effusion. Diffusely decreased attenuation of the liver is consistent with steatosis. Tiny layering stones are present in the gallbladder. There is no biliary dilatation. The spleen, adrenal glands, and pancreas are unremarkable. Small bilateral nonobstructive renal calculi are unchanged. An 11 mm cyst in the anterior interpolar left kidney is unchanged. A subcentimeter hypodensity in the upper pole the left kidney is too small to characterize. There is a small sliding hiatal hernia. A small lipoma in the duodenum is unchanged. Oral contrast is present in nondilated loops of proximal small bowel without evidence of bowel obstruction. The appendix is unremarkable. Diffuse aortoiliac atherosclerosis is noted. No free fluid or enlarged lymph nodes are identified. The bladder is unremarkable. The uterus and ovaries are unremarkable. Subcutaneous injection sites in the anterior abdominal wall. Prior right total hip arthroplasty. Moderate lower lumbar facet arthrosis with slight anterolisthesis of L3 on L4 and L4 on L5 and slight retrolisthesis of L5 on S1, unchanged. IMPRESSION: 1. No acute abnormality identified in the abdomen or pelvis. 2. Nonobstructing nephrolithiasis bilaterally. 3. Aortic atherosclerosis. Electronically Signed   By: Logan Bores M.D.   On:  06/23/2016 19:59     ASSESSMENT AND PLAN:    82 year old female with a history of fibromyalgia and ASCVD who presented with diarrhea and found to have C. Difficile.  1. C. difficile diarrhea: Patient is started on vancomycin. Diarrhea is improving. Continue vancomycin and treat for 2 weeks.  2. Hypokalemia: Due to diarrhea which will be replaced as needed.  3. Generalized weakness with fibronodular: Physical therapy consultation. She was referred to skilled nursing facility at last hospital stay but due to financial reasons was discharged home with home health.  4. Essential hypertension: Continue Toprol, losartan and Norvasc.  5. ASCVD: Continue aspirin and metoprolol.   Management plans discussed with the patient and she is in agreement.  CODE STATUS: full  TOTAL TIME TAKING CARE OF THIS PATIENT: 30 minutes.     POSSIBLE D/C 1-2 days, DEPENDING ON CLINICAL CONDITION.   Albena Comes M.D on 06/24/2016 at 10:08 AM  Between 7am to 6pm - Pager - [phone] After 6pm go to [email or web address] - password [email or web address] [facility]  Office  [phone]  CC: Primary care physician; [name]  Note: This dictation was prepared with Dragon dictation along with smaller phrase technology. Any transcriptional errors that result from this process are unintentional.

## 2016-06-24 NOTE — Progress Notes (Signed)
Dr. Benjie Karvonen notified for a request for PPD so patient can be placed in assistive living. Dr. Benjie Karvonen stated to order PPD.

## 2016-06-24 NOTE — Clinical Social Work Note (Signed)
ED CSW assessed pt in the ED prior to admission to the medical floor. ALF is being pursued. Pt is currently under Medicare OBS. CSW requested a PPD, which is required for ALF placement. At time of note, pt does not have any ALF bed offers. CSW will continue to follow.   Darden Dates, MSW, LCSW Clinical Social Worker  [phone]

## 2016-06-24 NOTE — Progress Notes (Signed)
LCSW sent out information to family care homes and ALF and will now send out to SNF. Patient requires a 3 night minimum stay to qualify for SNF. Patient was agreeable to private pay a family care home/ALF but again falls short for some ( Budget 2300-2600).  SW awaiting responses from several facilities.  [employer] LCSW [phone]

## 2016-06-24 NOTE — Care Management Obs Status (Signed)
[facility] NOTIFICATION   Patient Details  Name: Michele Schultz MRN: [id number] Date of Birth: 21-Dec-1930   Medicare Observation Status Notification Given:  Yes    Carles Collet, RN 06/24/2016, 9:07 AM

## 2016-06-25 DIAGNOSIS — F4323 Adjustment disorder with mixed anxiety and depressed mood: Secondary | ICD-10-CM | POA: Diagnosis not present

## 2016-06-25 DIAGNOSIS — A047 Enterocolitis due to Clostridium difficile: Secondary | ICD-10-CM | POA: Diagnosis not present

## 2016-06-25 LAB — BASIC METABOLIC PANEL
ANION GAP: 7 (ref 5–15)
BUN: 12 mg/dL (ref 6–20)
CHLORIDE: 105 mmol/L (ref 101–111)
CO2: 28 mmol/L (ref 22–32)
Calcium: 8.8 mg/dL — ABNORMAL LOW (ref 8.9–10.3)
Creatinine, Ser: 0.62 mg/dL (ref 0.44–1.00)
Glucose, Bld: 148 mg/dL — ABNORMAL HIGH (ref 65–99)
POTASSIUM: 3.7 mmol/L (ref 3.5–5.1)
SODIUM: 140 mmol/L (ref 135–145)

## 2016-06-25 LAB — URINE CULTURE: Culture: NO GROWTH

## 2016-06-25 NOTE — Progress Notes (Signed)
LCSW met with patient, she was provided [facility] family care Home information sheet. Ms Mady Gemma will await the patient to call her. No personal information was shared. This private pay home has 24-7 staff, medication management, transportation to doctors appointment,meals,activities ( 1700.00/private pay).  Lyra Alaimo LCSW

## 2016-06-25 NOTE — Progress Notes (Signed)
[facility] [place] NAME: Michele Schultz    MR#:  [id number]  DATE OF BIRTH:  Feb 27, 1931  SUBJECTIVE:   Patient Very teary this morning. She is experiencing grief from the loss of her husband. She feels guilty due to her diarrhea and the need to ask for help from nursing staff. Diarrhea is slowly resolving.  REVIEW OF SYSTEMS:    Review of Systems  Constitutional: Negative for chills, fever and malaise/fatigue.  HENT: Negative.  Negative for ear discharge, ear pain, hearing loss, nosebleeds and sore throat.   Eyes: Negative.  Negative for blurred vision and pain.  Respiratory: Negative.  Negative for cough, hemoptysis, shortness of breath and wheezing.   Cardiovascular: Negative.  Negative for chest pain, palpitations and leg swelling.  Gastrointestinal: Positive for diarrhea (improved). Negative for abdominal pain, blood in stool, nausea and vomiting.  Genitourinary: Negative.  Negative for dysuria.  Musculoskeletal: Negative.  Negative for back pain.  Skin: Negative.   Neurological: Positive for weakness. Negative for dizziness, tremors, speech change, focal weakness, seizures and headaches.  Endo/Heme/Allergies: Negative.  Does not bruise/bleed easily.  Psychiatric/Behavioral: Positive for depression. Negative for hallucinations and suicidal ideas.    Tolerating Diet:Yes    DRUG ALLERGIES:   Allergies  Allergen Reactions  . Citalopram Other (See Comments)    Reaction:  Altered mental status   . Cymbalta [Duloxetine Hcl] Other (See Comments)    Reaction:  Sedative for pt   . Imipramine Other (See Comments)    Reaction:  Unknown   . Proton Pump Inhibitors Other (See Comments)    Reaction:  Unknown   . Venlafaxine Nausea And Vomiting and Other (See Comments)    Reaction:  Dizziness     VITALS:  Blood pressure 138/72, pulse (!) 110, temperature 98.3 F (36.8 C), temperature source Oral, resp. rate 20, height 5\' 4"  (1.626 m),  weight 108.9 kg (240 lb), SpO2 97 %.  PHYSICAL EXAMINATION:   Physical Exam  Constitutional: She is oriented to person, place, and time and well-developed, well-nourished, and in no distress. No distress.  HENT:  Head: Normocephalic.  Eyes: No scleral icterus.  Neck: Normal range of motion. Neck supple. No JVD present. No tracheal deviation present.  Cardiovascular: Normal rate, regular rhythm and normal heart sounds.  Exam reveals no gallop and no friction rub.   No murmur heard. Pulmonary/Chest: Effort normal and breath sounds normal. No respiratory distress. She has no wheezes. She has no rales. She exhibits no tenderness.  Abdominal: Soft. Bowel sounds are normal. She exhibits no distension and no mass. There is no tenderness. There is no rebound and no guarding.  Musculoskeletal: Normal range of motion. She exhibits no edema.  Neurological: She is alert and oriented to person, place, and time.  Skin: Skin is warm. No rash noted. No erythema.  Psychiatric: Judgment normal.  Depressed teary      LABORATORY PANEL:   CBC  Recent Labs Lab 06/24/16 0506  WBC 6.6  HGB 11.6*  HCT 34.6*  PLT 232   ------------------------------------------------------------------------------------------------------------------  Chemistries   Recent Labs Lab 06/23/16 1449  06/25/16 0430  NA 140  < > 140  K 3.3*  < > 3.7  CL 104  < > 105  CO2 24  < > 28  GLUCOSE 206*  < > 148*  BUN 13  < > 12  CREATININE 0.72  < > 0.62  CALCIUM 9.4  < > 8.8*  MG 1.9  --   --  AST 42*  --   --   ALT 31  --   --   ALKPHOS 91  --   --   BILITOT 0.6  --   --   < > = values in this interval not displayed. ------------------------------------------------------------------------------------------------------------------  Cardiac Enzymes  Recent Labs Lab 06/23/16 1449  TROPONINI <0.03    ------------------------------------------------------------------------------------------------------------------  RADIOLOGY:  Ct Head Wo Contrast  Result Date: 06/23/2016 CLINICAL DATA:  Fall hitting both front and back of her head. Persistent nausea, vomiting, and diarrhea. New onset headache. EXAM: CT HEAD WITHOUT CONTRAST CT CERVICAL SPINE WITHOUT CONTRAST TECHNIQUE: Multidetector CT imaging of the head and cervical spine was performed following the standard protocol without intravenous contrast. Multiplanar CT image reconstructions of the cervical spine were also generated. COMPARISON:  CT of the head 06/16/2016. CT of the head and cervical spine 06/05/2016 FINDINGS: CT HEAD FINDINGS Mild generalized atrophy and white matter disease is stable. No acute cortical infarct, hemorrhage, or mass lesion is present. Basal ganglia are intact. Insular ribbon is normal. A remote right cerebellar infarct is stable. The brainstem and cerebellum are otherwise unremarkable. No acute hemorrhage is present. No significant extracranial soft tissue trauma is evident. The calvarium is intact. 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 Atherosclerotic calcifications are present within the cavernous internal carotid arteries. Bilateral lens replacements are present. The globes and orbits are otherwise intact. CT CERVICAL SPINE FINDINGS Cervical spine is imaged from the skullbase through the T2-3 level. Vertebral body heights and alignment are maintained. No acute fracture traumatic subluxation is present. Straightening of the normal cervical lordosis is stable. Atherosclerotic calcifications are noted at the carotid bifurcations bilaterally. The carotid bifurcations are medial at the level of C2-3. The soft tissues of the neck are otherwise unremarkable. The lung apices are clear. Atherosclerotic calcifications are present at the aortic arch. IMPRESSION: 1. No acute intracranial abnormality or significant interval  change. 2. No significant extracranial trauma. 3. Stable atrophy and white matter disease. 4. Multilevel spondylosis of the cervical spine without significant change. No acute fracture traumatic subluxation. 5. Atherosclerosis evident at the aortic arch and carotid bifurcations bilaterally. Electronically Signed   By: San Morelle M.D.   On: 06/23/2016 17:03   Ct Cervical Spine Wo Contrast  Result Date: 06/23/2016 CLINICAL DATA:  Fall hitting both front and back of her head. Persistent nausea, vomiting, and diarrhea. New onset headache. EXAM: CT HEAD WITHOUT CONTRAST CT CERVICAL SPINE WITHOUT CONTRAST TECHNIQUE: Multidetector CT imaging of the head and cervical spine was performed following the standard protocol without intravenous contrast. Multiplanar CT image reconstructions of the cervical spine were also generated. COMPARISON:  CT of the head 06/16/2016. CT of the head and cervical spine 06/05/2016 FINDINGS: CT HEAD FINDINGS Mild generalized atrophy and white matter disease is stable. No acute cortical infarct, hemorrhage, or mass lesion is present. Basal ganglia are intact. Insular ribbon is normal. A remote right cerebellar infarct is stable. The brainstem and cerebellum are otherwise unremarkable. No acute hemorrhage is present. No significant extracranial soft tissue trauma is evident. The calvarium is intact. 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 Atherosclerotic calcifications are present within the cavernous internal carotid arteries. Bilateral lens replacements are present. The globes and orbits are otherwise intact. CT CERVICAL SPINE FINDINGS Cervical spine is imaged from the skullbase through the T2-3 level. Vertebral body heights and alignment are maintained. No acute fracture traumatic subluxation is present. Straightening of the normal cervical lordosis is stable. Atherosclerotic calcifications are noted at the carotid bifurcations  bilaterally. The carotid bifurcations are  medial at the level of C2-3. The soft tissues of the neck are otherwise unremarkable. The lung apices are clear. Atherosclerotic calcifications are present at the aortic arch. IMPRESSION: 1. No acute intracranial abnormality or significant interval change. 2. No significant extracranial trauma. 3. Stable atrophy and white matter disease. 4. Multilevel spondylosis of the cervical spine without significant change. No acute fracture traumatic subluxation. 5. Atherosclerosis evident at the aortic arch and carotid bifurcations bilaterally. Electronically Signed   By: San Morelle M.D.   On: 06/23/2016 17:03   Ct Abdomen Pelvis W Contrast  Result Date: 06/23/2016 CLINICAL DATA:  Nausea, vomiting, diarrhea, and weakness. EXAM: CT ABDOMEN AND PELVIS WITH CONTRAST TECHNIQUE: Multidetector CT imaging of the abdomen and pelvis was performed using the standard protocol following bolus administration of intravenous contrast. CONTRAST:  144mL ISOVUE-300 IOPAMIDOL (ISOVUE-300) INJECTION 61% COMPARISON:  04/15/2016 FINDINGS: Mild scarring and subpleural reticulation at the lung bases are similar to the prior study. No pleural effusion. Diffusely decreased attenuation of the liver is consistent with steatosis. Tiny layering stones are present in the gallbladder. There is no biliary dilatation. The spleen, adrenal glands, and pancreas are unremarkable. Small bilateral nonobstructive renal calculi are unchanged. An 11 mm cyst in the anterior interpolar left kidney is unchanged. A subcentimeter hypodensity in the upper pole the left kidney is too small to characterize. There is a small sliding hiatal hernia. A small lipoma in the duodenum is unchanged. Oral contrast is present in nondilated loops of proximal small bowel without evidence of bowel obstruction. The appendix is unremarkable. Diffuse aortoiliac atherosclerosis is noted. No free fluid or enlarged lymph nodes are identified. The bladder is unremarkable. The uterus  and ovaries are unremarkable. Subcutaneous injection sites in the anterior abdominal wall. Prior right total hip arthroplasty. Moderate lower lumbar facet arthrosis with slight anterolisthesis of L3 on L4 and L4 on L5 and slight retrolisthesis of L5 on S1, unchanged. IMPRESSION: 1. No acute abnormality identified in the abdomen or pelvis. 2. Nonobstructing nephrolithiasis bilaterally. 3. Aortic atherosclerosis. Electronically Signed   By: Logan Bores M.D.   On: 06/23/2016 19:59     ASSESSMENT AND PLAN:    82 year old female with a history of fibromyalgia and ASCVD who presented with diarrhea and found to have C. Difficile.  1. C. difficile diarrhea: Patient will continue vancomycin for total of 2 weeks. Diarrhea slowly improving. Do not use antimotility medications due to C. Difficile.   2. Hypokalemia: Due to diarrhea which will be replaced as needed.  3. Generalized weakness: Patient will need skilled nursing facility at discharge. Social worker is working on this. 4. Essential hypertension: Continue Toprol, losartan and Norvasc.  5. ASCVD: Continue aspirin and metoprolol.  6. Depression: Obtain psychiatry consultation. She has more grief reaction as well due to the loss of her husband. Management plans discussed with the patient and she is in agreement.  CODE STATUS: full  TOTAL TIME TAKING CARE OF THIS PATIENT: 29 minutes.     POSSIBLE D/C tomorrow, DEPENDING ON CLINICAL CONDITION.   Hunter Bachar M.D on 06/25/2016 at 9:44 AM  Between 7am to 6pm - Pager - [phone] After 6pm go to [email or web address] - password [email or web address] [facility]  Office  [phone]  CC: Primary care physician; [name]  Note: This dictation was prepared with Dragon dictation along with smaller phrase technology. Any transcriptional errors that result from this process are unintentional.

## 2016-06-25 NOTE — Progress Notes (Signed)
BP 114/58.  Pt scheduled to get lopressor, norvasc and losartan.  Dr Benjie Karvonen said to give only losartan.  If BP starts to increase than lopressor may be given

## 2016-06-25 NOTE — Consult Note (Signed)
[facility] Face-to-Face Psychiatry Consult   Reason for Consult:  Depression Referring Physician:  Bettey Schultz, M.D Patient Identification: Michele Schultz MRN:  [id number] Principal Diagnosis: Adjustment disorder with depressed mood. Diagnosis:   Patient Active Problem List   Diagnosis Date Noted  . Diarrhea [R19.7] 06/23/2016  . Hypertensive heart disease [I11.9]   . Wide-complex tachycardia ([place]) [I47.2]   . Pain in the chest [R07.9]   . Emesis [R11.10]   . Weakness of both legs [R29.898]   . Coronary artery disease involving coronary bypass graft of native heart with unspecified angina pectoris [I25.709]   . S/P AVR (aortic valve replacement) [Z95.4]   . Falls frequently [R29.6]   . HLD (hyperlipidemia) [E78.5]   . Fibromyalgia [M79.7]   . RAD (reactive airway disease) [J45.909]   . Morbid obesity ([place]) [E66.01]   . Aortic stenosis, severe [I35.0]   . Chronic pain [G89.29] 06/17/2014  . Pain in the muscles [M79.1] 06/19/2013  . Chest pain [R07.9] 06/19/2013  . Wheezing [R06.2] 06/19/2013  . Generalized weakness [R53.1] 06/03/2013  . Dyspnea [R06.00] 01/27/2013  . Nausea [R11.0] 03/05/2012  . Constipation [K59.00] 03/05/2012  . Atrial fibrillation-non sustained [I48.91] 02/09/2012  . Cardiac pacemaker -st Judes [Z95.0]   . Chronic diastolic heart failure ([place]) [I50.32]   . Coronary artery disease [I25.10]   . Long term (current) use of anticoagulants [Z79.01] 01/03/2012  . S/P CABG x 2 [Z95.1] 11/14/2011  . S/P aortic valve replacement with porcine valve [Z95.3] 11/14/2011  . Back pain [M54.9] 11/14/2011  . Complete heart block ([place]) [I44.2] 10/20/2011  . HTN (hypertension) [I10] 09/15/2011  . Spinal stenosis [M48.00] 03/22/2011  . Fatigue [R53.83] 03/22/2011  . Hyperlipidemia [E78.5] 03/22/2011    Total Time spent with patient: 1 hour  Subjective:   Michele Schultz is a 82 y.o. female patient admitted with diarrhea and weakness.  HPI:    Michele Schultz  is a 82 y.o.  female with Multiple medical problems including fibromyalgia, hypertension, CAD status post CABG, aortic stenosis, arthritis, spinal stenosis and inability to get up, GERD who was just discharged from the hospital 2 days ago comes back again with diarrhea and weakness. Patient has chronic nausea, vomiting and previous admissions for the same. During her last admission, he also had diarrhea. Stool studies were negative and patient was actually referred to go to rehabilitation. Due to some insurance issues, patient was discharged home with home health and the social worker was supposed to follow up with the patient. Patient has been bedbound for the last several months and is being followed by Drs. making house calls. Today she was noted to be lying in her own stool and urine and was brought to the emergency room.  Psychiatric consult was placed to follow up with her depression and anxiety since the death of her husband in 04/26/2023. During my interview patient was very depressed and was tearful but she reported that she does not want to have any "pity party". 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 She reported that now since she has become week no [facility] Sch[facility] is helping her. She reported that her husband passed away in Apr 26, 2023 due to medical complications. She does not have any children. She reported that she lives by herself in the independent living section of the [facility] and has to take care of herself. Patient reported that she has been falling a lot and is unable to get up in  the night to go to the bathroom. She decides to stay in the bed as she is scared of falling. She pays on herself and has been losing bowel as well. Patient reported that she is aware of the staying in the bed and being in the feces and in her urine for 2-3 hours at a time but she became very sad and tearful describing this situation. She reported that she cannot afford to have sitter for herself as  she has limited income. Patient reported that she gets around $3000 per month which helps with her present and with her utilities. She reported that she has nieces and nephews but there are all busy in their own life circumstances. Patient currently denied having any suicidal homicidal ideations or plans. She reported that she prays a lot and she does not feel that she needs any medications at this time. She just asked me that I should pray for her at this time as well.   Past Psychiatric History:  Patient has been prescribed Cymbalta and Cytomel Michele Schultz in the past for her depression and she is allergic to both the medications. She denied any history of psychiatric hospitalization.  Risk to Self: Is patient at risk for suicide?: No Risk to Others:   Prior Inpatient Therapy:   Prior Outpatient Therapy:    Past Medical History:  Past Medical History:  Diagnosis Date  . Anemia   . Aortic stenosis, severe    a. s/p Magna Ease pericardial tissue valve size 21 mm replacement in 10/2011 for severe AS 11/12; b. echo 07/2015: EF 55-60% mod concentric LVH, GR1DD, LA mildly dilated, PASP 45 mm Hg  . Cardiac pacemaker -st Judes    11/12  . Chronic diastolic heart failure ([place])    a. echo 2014: EF 55-60%, no RWMA, GR1DD, PASP 47 mm Hg; b. echo 07/2015: EF 55-60% mod concentric LVH, GR1DD, LA mildly dilated, PASP 45 mm Hg  . Complete heart block [facility])    a. s/p St Jude PPM 10/2011 (Ser # [id number]).  . Coronary artery disease    a. s/p 2v CABG 11/12 (VG-LAD, VG-OM1); b. Lexiscan 08/2015: low risk, no ischemia, EF 55-65%.  . Enthesopathy of hip region   . Esophageal reflux    followed by Dr.Seigal. stabilized with a combination of Nexium and Zantac  . Fibromyalgia   . HLD (hyperlipidemia)   . Hypertensive heart disease   . Knee joint replacement by other means   . Morbid obesity ([place])   . Neuralgia, neuritis, and radiculitis, unspecified   . Neuropathy ([place])   . Onychia and paronychia of toe   .  Osteoarthrosis, unspecified whether generalized or localized, pelvic region and thigh    mainly in her back and knees  . PAF (paroxysmal atrial fibrillation) ([place])    a. brief episodes of AF previously noted on device interrogations.  Marland Kitchen RAD (reactive airway disease)    a. chronic SOB  . Spinal stenosis   . Stress incontinence, female    followed by Dr.Cope  . Type II or unspecified type diabetes mellitus without mention of complication, not stated as uncontrolled    a. pt. reports that she is borderline   . Wide-complex tachycardia ([place])    a. Noted 4/10 - brief episode in ED. Noted again 4/21 in clinic->felt most likely to be atrial tach.    Past Surgical History:  Procedure Laterality Date  . AORTIC VALVE REPLACEMENT  10/17/2011   Procedure: AORTIC VALVE REPLACEMENT (AVR);  Surgeon:  Melrose Nakayama, MD;  Location: [facility];  Service: Open Heart Surgery;  Laterality: N/A;  . CARDIAC CATHETERIZATION  08/2009   50% stenosis distal left main, 50% stenosis ostial left circumflex.   Marland Kitchen CARDIAC CATHETERIZATION     at [facility]  . CORONARY ARTERY BYPASS GRAFT  10/17/2011   Procedure: CORONARY ARTERY BYPASS GRAFTING (CABG);  Surgeon: Melrose Nakayama, MD;  Location: [place] [place];  Service: Open Heart Surgery;  Laterality: N/A;  Times Two, using right leg greater saphenous vein harvested endoscopically  . EYE SURGERY     IOL/ after cataracts removed- bilateral   . NASAL SINUS SURGERY  2009  . PERMANENT PACEMAKER INSERTION N/A 10/20/2011   Procedure: PERMANENT PACEMAKER INSERTION;  Surgeon: Thompson Grayer, MD;  Location: [facility] CATH [facility];  Service: Cardiovascular;  Laterality: N/A;  . REPLACEMENT TOTAL KNEE  11/2006   right knee  . TOTAL HIP ARTHROPLASTY  09/2010   Family History:  Family History  Problem Relation Age of Onset  . Diabetes Other   . Heart disease Sister   . Heart disease Brother   . Heart disease Brother   . Heart disease Brother   . Anesthesia problems Neg Hx   . Hypotension Neg Hx    . Malignant hyperthermia Neg Hx   . Pseudochol deficiency Neg Hx    Family Psychiatric  History: no history of psychiatric illness. Social History:  History  Alcohol Use No     History  Drug Use No    Social History   Social History  . Marital status: Widowed    Spouse name: N/A  . Number of children: N/A  . Years of education: N/A   Social History Main Topics  . Smoking status: Never Smoker  . Smokeless tobacco: Never Used  . Alcohol use No  . Drug use: No  . Sexual activity: Not Asked   Other Topics Concern  . None   Social History Narrative  . None   Additional Social History:    Allergies:   Allergies  Allergen Reactions  . Citalopram Other (See Comments)    Reaction:  Altered mental status   . Cymbalta [Duloxetine Hcl] Other (See Comments)    Reaction:  Sedative for pt   . Imipramine Other (See Comments)    Reaction:  Unknown   . Proton Pump Inhibitors Other (See Comments)    Reaction:  Unknown   . Venlafaxine Nausea And Vomiting and Other (See Comments)    Reaction:  Dizziness     Labs:  Results for orders placed or performed during the hospital encounter of 06/23/16 (from the past 48 hour(s))  Lipase, blood     Status: None   Collection Time: 06/23/16  2:49 PM  Result Value Ref Range   Lipase 34 11 - 51 U/L  Comprehensive metabolic panel     Status: Abnormal   Collection Time: 06/23/16  2:49 PM  Result Value Ref Range   Sodium 140 135 - 145 mmol/L   Potassium 3.3 (L) 3.5 - 5.1 mmol/L   Chloride 104 101 - 111 mmol/L   CO2 24 22 - 32 mmol/L   Glucose, Bld 206 (H) 65 - 99 mg/dL   BUN 13 6 - 20 mg/dL   Creatinine, Ser 0.72 0.44 - 1.00 mg/dL   Calcium 9.4 8.9 - 10.3 mg/dL   Total Protein 7.6 6.5 - 8.1 g/dL   Albumin 3.7 3.5 - 5.0 g/dL   AST 42 (H) 15 - 41 U/L  ALT 31 14 - 54 U/L   Alkaline Phosphatase 91 38 - 126 U/L   Total Bilirubin 0.6 0.3 - 1.2 mg/dL   GFR calc non Af Amer >60 >60 mL/min   GFR calc Af Amer >60 >60 mL/min    Comment:  (NOTE) The eGFR has been calculated using the CKD EPI equation. This calculation has not been validated in all clinical situations. eGFR's persistently <60 mL/min signify possible Chronic Kidney Disease.    Anion gap 12 5 - 15  CBC     Status: Abnormal   Collection Time: 06/23/16  2:49 PM  Result Value Ref Range   WBC 8.2 3.6 - 11.0 K/uL   RBC 4.43 3.80 - 5.20 MIL/uL   Hemoglobin 12.3 12.0 - 16.0 g/dL   HCT 37.5 35.0 - 47.0 %   MCV 84.5 80.0 - 100.0 fL   MCH 27.7 26.0 - 34.0 pg   MCHC 32.7 32.0 - 36.0 g/dL   RDW 16.7 (H) 11.5 - 14.5 %   Platelets 257 150 - 440 K/uL  Urinalysis complete, with microscopic     Status: Abnormal   Collection Time: 06/23/16  2:49 PM  Result Value Ref Range   Color, Urine YELLOW (A) YELLOW   APPearance CLEAR (A) CLEAR   Glucose, UA 50 (A) NEGATIVE mg/dL   Bilirubin Urine NEGATIVE NEGATIVE   Ketones, ur NEGATIVE NEGATIVE mg/dL   Specific Gravity, Urine 1.024 1.005 - 1.030   Hgb urine dipstick 2+ (A) NEGATIVE   pH 6.0 5.0 - 8.0   Protein, ur 30 (A) NEGATIVE mg/dL   Nitrite NEGATIVE NEGATIVE   Leukocytes, UA NEGATIVE NEGATIVE   RBC / HPF 0-5 0 - 5 RBC/hpf   WBC, UA 0-5 0 - 5 WBC/hpf   Bacteria, UA NONE SEEN NONE SEEN   Squamous Epithelial / LPF 0-5 (A) NONE SEEN   Mucous PRESENT   Troponin I     Status: None   Collection Time: 06/23/16  2:49 PM  Result Value Ref Range   Troponin I <0.03 <0.03 ng/mL  Urine culture     Status: None   Collection Time: 06/23/16  2:49 PM  Result Value Ref Range   Specimen Description URINE, RANDOM    Special Requests NONE    Culture NO GROWTH Performed at [facility]     Report Status 06/25/2016 FINAL   Magnesium     Status: None   Collection Time: 06/23/16  2:49 PM  Result Value Ref Range   Magnesium 1.9 1.7 - 2.4 mg/dL  Gastrointestinal Panel by PCR , Stool     Status: None   Collection Time: 06/23/16  2:49 PM  Result Value Ref Range   Campylobacter species NOT DETECTED NOT DETECTED   Plesimonas  shigelloides NOT DETECTED NOT DETECTED   Salmonella species NOT DETECTED NOT DETECTED   Yersinia enterocolitica NOT DETECTED NOT DETECTED   Vibrio species NOT DETECTED NOT DETECTED   Vibrio cholerae NOT DETECTED NOT DETECTED   Enteroaggregative E coli (EAEC) NOT DETECTED NOT DETECTED   Enteropathogenic E coli (EPEC) NOT DETECTED NOT DETECTED   Enterotoxigenic E coli (ETEC) NOT DETECTED NOT DETECTED   Shiga like toxin producing E coli (STEC) NOT DETECTED NOT DETECTED   E. coli O157 NOT DETECTED NOT DETECTED   Shigella/Enteroinvasive E coli (EIEC) NOT DETECTED NOT DETECTED   Cryptosporidium NOT DETECTED NOT DETECTED   Cyclospora cayetanensis NOT DETECTED NOT DETECTED   Entamoeba histolytica NOT DETECTED NOT DETECTED   Giardia lamblia  NOT DETECTED NOT DETECTED   Adenovirus F40/41 NOT DETECTED NOT DETECTED   Astrovirus NOT DETECTED NOT DETECTED   Norovirus GI/GII NOT DETECTED NOT DETECTED   Rotavirus A NOT DETECTED NOT DETECTED   Sapovirus (I, II, IV, and V) NOT DETECTED NOT DETECTED  C difficile quick scan w PCR reflex     Status: Abnormal   Collection Time: 06/23/16  2:49 PM  Result Value Ref Range   C Diff antigen POSITIVE (A) NEGATIVE   C Diff toxin NEGATIVE NEGATIVE   C Diff interpretation Results are indeterminate. See PCR results.   Clostridium Difficile by PCR     Status: Abnormal   Collection Time: 06/23/16  2:49 PM  Result Value Ref Range   Toxigenic C Difficile by pcr POSITIVE (A) NEGATIVE    Comment: CRITICAL RESULT CALLED TO, READ BACK BY AND VERIFIED WITH: [facility] MACROHON AT 2024 06/23/2016 BY TFK   Basic metabolic panel     Status: Abnormal   Collection Time: 06/24/16  5:06 AM  Result Value Ref Range   Sodium 139 135 - 145 mmol/L   Potassium 3.9 3.5 - 5.1 mmol/L   Chloride 105 101 - 111 mmol/L   CO2 27 22 - 32 mmol/L   Glucose, Bld 165 (H) 65 - 99 mg/dL   BUN 12 6 - 20 mg/dL   Creatinine, Ser 0.59 0.44 - 1.00 mg/dL   Calcium 8.7 (L) 8.9 - 10.3 mg/dL   GFR calc  non Af Amer >60 >60 mL/min   GFR calc Af Amer >60 >60 mL/min    Comment: (NOTE) The eGFR has been calculated using the CKD EPI equation. This calculation has not been validated in all clinical situations. eGFR's persistently <60 mL/min signify possible Chronic Kidney Disease.    Anion gap 7 5 - 15  CBC     Status: Abnormal   Collection Time: 06/24/16  5:06 AM  Result Value Ref Range   WBC 6.6 3.6 - 11.0 K/uL   RBC 4.13 3.80 - 5.20 MIL/uL   Hemoglobin 11.6 (L) 12.0 - 16.0 g/dL   HCT 34.6 (L) 35.0 - 47.0 %   MCV 83.8 80.0 - 100.0 fL   MCH 28.0 26.0 - 34.0 pg   MCHC 33.4 32.0 - 36.0 g/dL   RDW 16.9 (H) 11.5 - 14.5 %   Platelets 232 150 - 440 K/uL  Basic metabolic panel     Status: Abnormal   Collection Time: 06/25/16  4:30 AM  Result Value Ref Range   Sodium 140 135 - 145 mmol/L   Potassium 3.7 3.5 - 5.1 mmol/L   Chloride 105 101 - 111 mmol/L   CO2 28 22 - 32 mmol/L   Glucose, Bld 148 (H) 65 - 99 mg/dL   BUN 12 6 - 20 mg/dL   Creatinine, Ser 0.62 0.44 - 1.00 mg/dL   Calcium 8.8 (L) 8.9 - 10.3 mg/dL   GFR calc non Af Amer >60 >60 mL/min   GFR calc Af Amer >60 >60 mL/min    Comment: (NOTE) The eGFR has been calculated using the CKD EPI equation. This calculation has not been validated in all clinical situations. eGFR's persistently <60 mL/min signify possible Chronic Kidney Disease.    Anion gap 7 5 - 15    Current Facility-Administered Medications  Medication Dose Route Frequency Provider Last Rate Last Dose  . acetaminophen (TYLENOL) tablet 650 mg  650 mg Oral Q6H PRN Gladstone Lighter, MD       Or  .  acetaminophen (TYLENOL) suppository 650 mg  650 mg Rectal Q6H PRN Gladstone Lighter, MD      . acidophilus (RISAQUAD) capsule 1 capsule  1 capsule Oral BID Gladstone Lighter, MD   1 capsule at 06/25/16 1007  . aspirin EC tablet 81 mg  81 mg Oral Daily Gladstone Lighter, MD   81 mg at 06/25/16 1007  . azelastine (ASTELIN) 0.1 % nasal spray 1 spray  1 spray Each Nare PRN  Gladstone Lighter, MD      . benzonatate (TESSALON) capsule 200 mg  200 mg Oral TID PRN Gladstone Lighter, MD      . cyanocobalamin ((VITAMIN B-12)) injection 1,000 mcg  1,000 mcg Intramuscular Q30 days Gladstone Lighter, MD   1,000 mcg at 06/23/16 2126  . cycloSPORINE (RESTASIS) 0.05 % ophthalmic emulsion 1 drop  1 drop Both Eyes BID Gladstone Lighter, MD   1 drop at 06/25/16 1007  . enoxaparin (LOVENOX) injection 40 mg  40 mg Subcutaneous Q24H Gladstone Lighter, MD   40 mg at 06/24/16 2023  . gabapentin (NEURONTIN) capsule 300 mg  300 mg Oral BID Gladstone Lighter, MD   300 mg at 06/25/16 1007  . HYDROcodone-acetaminophen (NORCO) 10-325 MG per tablet 1 tablet  1 tablet Oral Q4H PRN Gladstone Lighter, MD   1 tablet at 06/25/16 1006  . ipratropium-albuterol (DUONEB) 0.5-2.5 (3) MG/3ML nebulizer solution 3 mL  3 mL Inhalation Q6H PRN Gladstone Lighter, MD      . losartan (COZAAR) tablet 25 mg  25 mg Oral Daily Gladstone Lighter, MD   25 mg at 06/25/16 1015  . metoCLOPramide (REGLAN) tablet 10 mg  10 mg Oral Q8H PRN Gladstone Lighter, MD   10 mg at 06/25/16 1006  . metoprolol succinate (TOPROL-XL) 24 hr tablet 50 mg  50 mg Oral Daily Gladstone Lighter, MD   50 mg at 06/24/16 0847  . nitroGLYCERIN (NITROSTAT) SL tablet 0.4 mg  0.4 mg Sublingual Q5 min PRN Gladstone Lighter, MD      . ondansetron (ZOFRAN) tablet 4 mg  4 mg Oral Q6H PRN Gladstone Lighter, MD       Or  . ondansetron (ZOFRAN) injection 4 mg  4 mg Intravenous Q6H PRN Gladstone Lighter, MD   4 mg at 06/24/16 1903  . pantoprazole (PROTONIX) EC tablet 40 mg  40 mg Oral BID Gladstone Lighter, MD   40 mg at 06/25/16 1007  . phenazopyridine (PYRIDIUM) tablet 200 mg  200 mg Oral TID PRN Gladstone Lighter, MD      . traMADol Veatrice Bourbon) tablet 50 mg  50 mg Oral Q6H PRN Gladstone Lighter, MD   50 mg at 06/24/16 2031  . tuberculin injection 5 Units  5 Units Intradermal Once Michele Costa, MD   5 Units at 06/24/16 1215  . vancomycin (VANCOCIN) 50 mg/mL  oral solution 125 mg  125 mg Oral QID Alexis Hugelmeyer, DO   125 mg at 06/25/16 1007    Musculoskeletal: Strength & Muscle Tone: decreased Gait & Station: unable to stand Patient leans: N/A  Psychiatric Specialty Exam: Physical Exam  Review of Systems  Psychiatric/Behavioral: Positive for depression. The patient is nervous/anxious.     Blood pressure (!) 114/58, pulse 81, temperature 98.1 F (36.7 C), temperature source Oral, resp. rate 18, height 5' 4"  (1.626 m), weight 240 lb (108.9 kg), SpO2 99 %.Body mass index is 41.2 kg/m.  General Appearance: Casual and Disheveled  Eye Contact:  Fair  Speech:  Slow  Volume:  Decreased  Mood:  Anxious  and Depressed  Affect:  Tearful  Thought Process:  Coherent and Goal Directed  Orientation:  Full (Time, Place, and Person)  Thought Content:  WDL  Suicidal Thoughts:  No  Homicidal Thoughts:  No  Memory:  Immediate;   Fair Recent;   Fair Remote;   Fair  Judgement:  Fair  Insight:  Fair  Psychomotor Activity:  Psychomotor Retardation  Concentration:  Concentration: Fair and Attention Span: Fair  Recall:  Good  Fund of Knowledge:  Fair  Language:  Fair  Akathisia:  No  Handed:  Right  AIMS (if indicated):     Assets:  Communication Skills Housing  ADL's:  Impaired  Cognition:  WNL  Sleep:        Treatment Plan Summary: Plan Patient currently denied having any suicidal ideations or plans and she does not take any medications at this time.   Discussed with the nursing staff at length about the need for social services made and she reported that she will contact a case management at the hospital. Patient does not need any medication at this time. Case management to help with the disposition of the patient.  Thank you for allowing me to participate in the care of this patient  Disposition: No evidence of imminent risk to self or others at present.   Patient does not meet criteria for psychiatric inpatient admission. Supportive  therapy provided about ongoing stressors.  Rainey Pines, MD 06/25/2016 2:35 PM

## 2016-06-26 ENCOUNTER — Encounter
Admission: RE | Admit: 2016-06-26 | Discharge: 2016-06-26 | Disposition: A | Discharge status: Home / Self Care | Payer: Medicare Other | Source: Ambulatory Visit | Attending: Internal Medicine | Admitting: Internal Medicine

## 2016-06-26 DIAGNOSIS — A047 Enterocolitis due to Clostridium difficile: Secondary | ICD-10-CM | POA: Diagnosis not present

## 2016-06-26 MED ORDER — HYDROCODONE-ACETAMINOPHEN 10-325 MG PO TABS: 1.0000 | ORAL_TABLET | ORAL | 0 refills | Status: DC | PRN

## 2016-06-26 MED ORDER — VANCOMYCIN 50 MG/ML ORAL SOLUTION: 125.0000 mg | Freq: Four times a day (QID) | ORAL | 0 refills | Status: AC

## 2016-06-26 MED ORDER — RISAQUAD PO CAPS: 1.0000 | ORAL_CAPSULE | Freq: Two times a day (BID) | ORAL | 0 refills | Status: AC

## 2016-06-26 NOTE — Discharge Summary (Signed)
[facility] [place] NAME: Michele Schultz    MR#:  [id number]  DATE OF BIRTH:  27-Nov-1931  DATE OF ADMISSION:  06/23/2016 ADMITTING PHYSICIAN: Gladstone Lighter, MD  DATE OF DISCHARGE: 06/26/2016  PRIMARY CARE PHYSICIAN: [name]    ADMISSION DIAGNOSIS:  Diarrhea [R19.7] Nausea & vomiting [R11.2] Inability to walk [R26.2] Vomiting and diarrhea [R11.10, R19.7]  DISCHARGE DIAGNOSIS:  Active Problems:   Diarrhea   SECONDARY DIAGNOSIS:   Past Medical History:  Diagnosis Date  . Anemia   . Aortic stenosis, severe    a. s/p Magna Ease pericardial tissue valve size 21 mm replacement in 10/2011 for severe AS 11/12; b. echo 07/2015: EF 55-60% mod concentric LVH, GR1DD, LA mildly dilated, PASP 45 mm Hg  . Cardiac pacemaker -st Judes    11/12  . Chronic diastolic heart failure ([place])    a. echo 2014: EF 55-60%, no RWMA, GR1DD, PASP 47 mm Hg; b. echo 07/2015: EF 55-60% mod concentric LVH, GR1DD, LA mildly dilated, PASP 45 mm Hg  . Complete heart block [facility])    a. s/p St Jude PPM 10/2011 (Ser # [id number]).  . Coronary artery disease    a. s/p 2v CABG 11/12 (VG-LAD, VG-OM1); b. Lexiscan 08/2015: low risk, no ischemia, EF 55-65%.  . Enthesopathy of hip region   . Esophageal reflux    followed by Dr.Seigal. stabilized with a combination of Nexium and Zantac  . Fibromyalgia   . HLD (hyperlipidemia)   . Hypertensive heart disease   . Knee joint replacement by other means   . Morbid obesity ([place])   . Neuralgia, neuritis, and radiculitis, unspecified   . Neuropathy ([place])   . Onychia and paronychia of toe   . Osteoarthrosis, unspecified whether generalized or localized, pelvic region and thigh    mainly in her back and knees  . PAF (paroxysmal atrial fibrillation) ([place])    a. brief episodes of AF previously noted on device interrogations.  [place] [facility] RAD (reactive airway disease)    a. chronic SOB  . Spinal stenosis   . Stress incontinence,  female    followed by Dr.Cope  . Type II or unspecified type diabetes mellitus without mention of complication, not stated as uncontrolled    a. pt. reports that she is borderline   . Wide-complex tachycardia ([place])    a. Noted 4/10 - brief episode in ED. Noted again 4/21 in clinic->felt most likely to be atrial tach.    HOSPITAL COURSE:   82 year old female with a history of fibromyalgia and ASCVD who presented with diarrhea and found to have C. Difficile.  1. C. difficile diarrhea: Patient will continue oral vancomycin for total of 2 weeks. Diarrhea is improving. Do not use antimotility medications due to C. Difficile.   2. Hypokalemia: Due to diarrhea and has improved.  3. Generalized weakness: PT is recommending skilled nursing facility at discharge.  4. Essential hypertension: Continue Toprol, losartan and Norvasc.  5. ASCVD: Continue aspirin and metoprolol.  6. Depression with underlying congnitive impairment: sue to grief reaction due to the loss of her husband. Psychiatry evaluated patient. She did not want to start antidepressant medications and is not a harm risk She should have outpatient NEURO evaluation for cognitive function.   Management plans discussed with the patient and she is in agreement  DISCHARGE CONDITIONS AND DIET:   Stable Heart healthy diet  CONSULTS OBTAINED:    DRUG ALLERGIES:   Allergies  Allergen Reactions  .  Citalopram Other (See Comments)    Reaction:  Altered mental status   . Cymbalta [Duloxetine Hcl] Other (See Comments)    Reaction:  Sedative for pt   . Imipramine Other (See Comments)    Reaction:  Unknown   . Proton Pump Inhibitors Other (See Comments)    Reaction:  Unknown   . Venlafaxine Nausea And Vomiting and Other (See Comments)    Reaction:  Dizziness     DISCHARGE MEDICATIONS:   Current Discharge Medication List    START taking these medications   Details  acidophilus (RISAQUAD) CAPS capsule Take 1 capsule  by mouth 2 (two) times daily. Qty: 1 capsule, Refills: 0    vancomycin (VANCOCIN) 50 mg/mL oral solution Take 2.5 mLs (125 mg total) by mouth 4 (four) times daily. Qty: 50 mL, Refills: 0      CONTINUE these medications which have CHANGED   Details  HYDROcodone-acetaminophen (NORCO) 10-325 MG tablet Take 1 tablet by mouth every 4 (four) hours as needed for moderate pain. Reported on 05/30/2016 Qty: 30 tablet, Refills: 0      CONTINUE these medications which have NOT CHANGED   Details  albuterol-ipratropium (COMBIVENT) 18-103 MCG/ACT inhaler Inhale 2 puffs into the lungs every 6 (six) hours as needed for wheezing. Qty: 1 Inhaler, Refills: 4    amLODipine (NORVASC) 5 MG tablet Take 1 tablet (5 mg total) by mouth daily. Qty: 30 tablet, Refills: 6    aspirin EC 81 MG tablet Take 81 mg by mouth daily.    azelastine (ASTELIN) 0.1 % nasal spray Place 1 spray into both nostrils as needed for rhinitis.     benzonatate (TESSALON) 100 MG capsule Take 200 mg by mouth 3 (three) times daily as needed for cough.     cyanocobalamin (,VITAMIN B-12,) 1000 MCG/ML injection Inject 1,000 mcg into the muscle every 30 (thirty) days.    cycloSPORINE (RESTASIS) 0.05 % ophthalmic emulsion Place 1 drop into both eyes 2 (two) times daily.     furosemide (LASIX) 20 MG tablet Take 1 tablet (20 mg total) by mouth every other day. Qty: 30 tablet, Refills: 6    gabapentin (NEURONTIN) 300 MG capsule Take 1 capsule (300 mg total) by mouth 2 (two) times daily. Qty: 60 capsule, Refills: 2    losartan (COZAAR) 25 MG tablet Take 25 mg by mouth daily.    magnesium hydroxide (MILK OF MAGNESIA) 400 MG/5ML suspension Take 15 mLs by mouth daily as needed for mild constipation.     metoCLOPramide (REGLAN) 10 MG tablet Take 1 tablet (10 mg total) by mouth every 8 (eight) hours as needed for nausea or vomiting. Qty: 30 tablet, Refills: 1    metoprolol succinate (TOPROL-XL) 50 MG 24 hr tablet Take 1 tablet (50 mg total)  by mouth daily. Qty: 30 tablet, Refills: 2    nitroGLYCERIN (NITROSTAT) 0.4 MG SL tablet Place 1 tablet (0.4 mg total) under the tongue every 5 (five) minutes as needed for chest pain. Qty: 90 tablet, Refills: 3    pantoprazole (PROTONIX) 40 MG tablet Take 40 mg by mouth 2 (two) times daily.      phenazopyridine (PYRIDIUM) 200 MG tablet Take 1 tablet (200 mg total) by mouth 3 (three) times daily as needed for pain. Qty: 20 tablet, Refills: 0    potassium chloride (K-DUR) 10 MEQ tablet Take 10 mEq by mouth daily.    promethazine (PHENERGAN) 25 MG tablet Take 1 tablet (25 mg total) by mouth every 6 (six) hours as  needed for nausea or vomiting. Qty: 30 tablet, Refills: 0    traMADol (ULTRAM) 50 MG tablet Take 1 tablet (50 mg total) by mouth every 6 (six) hours as needed. Qty: 20 tablet, Refills: 0              Today   CHIEF COMPLAINT:  Doing ok Diarrhea improved    VITAL SIGNS:  Blood pressure (!) 150/68, pulse 91, temperature 98.5 F (36.9 C), temperature source Oral, resp. rate 18, height 5\' 4"  (1.626 m), weight 108.9 kg (240 lb), SpO2 99 %.   REVIEW OF SYSTEMS:  Review of Systems  Constitutional: Negative.  Negative for chills, fever and malaise/fatigue.  HENT: Negative.  Negative for ear discharge, ear pain, hearing loss, nosebleeds and sore throat.   Eyes: Negative.  Negative for blurred vision and pain.  Respiratory: Negative.  Negative for cough, hemoptysis, shortness of breath and wheezing.   Cardiovascular: Negative.  Negative for chest pain, palpitations and leg swelling.  Gastrointestinal: Negative.  Negative for abdominal pain, blood in stool, diarrhea (resolving), nausea and vomiting.  Genitourinary: Negative.  Negative for dysuria.  Musculoskeletal: Negative.  Negative for back pain.  Skin: Negative.   Neurological: Negative for dizziness, tremors, speech change, focal weakness, seizures and headaches.  Endo/Heme/Allergies: Negative.  Does not  bruise/bleed easily.  Psychiatric/Behavioral: Positive for depression and memory loss. Negative for hallucinations and suicidal ideas.     PHYSICAL EXAMINATION:  GENERAL:  82 y.o.-year-old patient lying in the bed with no acute distress.  NECK:  Supple, no jugular venous distention. No thyroid enlargement, no tenderness.  LUNGS: Normal breath sounds bilaterally, no wheezing, rales,rhonchi  No use of accessory muscles of respiration.  CARDIOVASCULAR: S1, S2 normal. No murmurs, rubs, or gallops.  ABDOMEN: Soft, non-tender, non-distended. Bowel sounds present. No organomegaly or mass.  EXTREMITIES: No pedal edema, cyanosis, or clubbing.  PSYCHIATRIC: The patient is alert and oriented x name, place and month.  SKIN: No obvious rash, lesion, or ulcer.   DATA REVIEW:   CBC  Recent Labs Lab 06/24/16 0506  WBC 6.6  HGB 11.6*  HCT 34.6*  PLT 232    Chemistries   Recent Labs Lab 06/23/16 1449  06/25/16 0430  NA 140  < > 140  K 3.3*  < > 3.7  CL 104  < > 105  CO2 24  < > 28  GLUCOSE 206*  < > 148*  BUN 13  < > 12  CREATININE 0.72  < > 0.62  CALCIUM 9.4  < > 8.8*  MG 1.9  --   --   AST 42*  --   --   ALT 31  --   --   ALKPHOS 91  --   --   BILITOT 0.6  --   --   < > = values in this interval not displayed.  Cardiac Enzymes  Recent Labs Lab 06/23/16 1449  TROPONINI <0.03    Microbiology Results  @[email or web address] @  RADIOLOGY:  No results found.    Management plans discussed with the patient and she is in agreement. Stable for discharge   Patient should follow up with PCP in 1 week  CODE STATUS:     Code Status Orders        Start     Ordered   06/23/16 2021  Full code  Continuous     06/23/16 2020    Code Status History    Date Active Date Inactive Code Status Order ID Comments User  Context   06/16/2016  8:35 PM 06/21/2016 10:09 PM Full Code [id number]  Lytle Butte, MD ED   03/13/2016  8:15 PM 03/14/2016  9:02 PM Full Code [id number]  Henreitta Leber, MD  Inpatient   09/13/2015  6:51 PM 09/18/2015  7:31 PM Full Code [id number]  Dustin Flock, MD Inpatient   10/20/2011  1:40 PM 10/22/2011  1:27 PM Full Code [id number]  Tiajuana Amass, RN Inpatient    Advance Directive Documentation   Flowsheet Row Most Recent Value  Type of Advance Directive  Living will  Pre-existing out of facility DNR order (yellow form or pink MOST form)  No data  "MOST" Form in Place?  No data      TOTAL TIME TAKING CARE OF THIS PATIENT: 36 minutes.    Note: This dictation was prepared with Dragon dictation along with smaller phrase technology. Any transcriptional errors that result from this process are unintentional.  Paulita Licklider M.D on 06/26/2016 at 9:03 AM  Between 7am to 6pm - Pager - [phone] After 6pm go to [email or web address] - Proofreader  [facility]  Office  [phone]  CC: Primary care physician; [name]

## 2016-06-26 NOTE — NC FL2 (Signed)
[place] LEVEL OF CARE SCREENING TOOL     IDENTIFICATION  Patient Name: Michele Schultz Birthdate: [birth year] Sex: female Admission Date (Current Location): 06/23/2016  [place] and [place] Number:  Engineering geologist and Address:  [facility], [address] [place], [place] [address]      Provider Number: [id number]  Attending Physician Name and Address:  Bettey Costa, MD  Relative Name and Phone Number:       Current Level of Care: Hospital  Recommended Level of Care: SNF  Prior Approval Number:    Date Approved/Denied:   PASRR Number:   [id number]   Discharge Plan: SNF    Current Diagnoses: Patient Active Problem List   Diagnosis Date Noted  . Diarrhea 06/23/2016  . Hypertensive heart disease   . Wide-complex tachycardia ([place])   . Pain in the chest   . Emesis   . Weakness of both legs   . Coronary artery disease involving coronary bypass graft of native heart with unspecified angina pectoris   . S/P AVR (aortic valve replacement)   . Falls frequently   . HLD (hyperlipidemia)   . Fibromyalgia   . RAD (reactive airway disease)   . Morbid obesity ([place])   . Aortic stenosis, severe   . Chronic pain 06/17/2014  . Pain in the muscles 06/19/2013  . Chest pain 06/19/2013  . Wheezing 06/19/2013  . Generalized weakness 06/03/2013  . Dyspnea 01/27/2013  . Nausea 03/05/2012  . Constipation 03/05/2012  . Atrial fibrillation-non sustained 02/09/2012  . Cardiac pacemaker -st Judes   . Chronic diastolic heart failure ([place])   . Coronary artery disease   . Long term (current) use of anticoagulants 01/03/2012  . S/P CABG x 2 11/14/2011  . S/P aortic valve replacement with porcine valve 11/14/2011  . Back pain 11/14/2011  . Complete heart block ([place]) 10/20/2011  . HTN (hypertension) 09/15/2011  . Spinal stenosis 03/22/2011  . Fatigue 03/22/2011  . Hyperlipidemia 03/22/2011    Orientation RESPIRATION BLADDER Height & Weight      Self, Time, Situation, Place  Normal Continent Weight: 240 lb (108.9 kg) Height:  5\' 4"  (162.6 cm)  BEHAVIORAL SYMPTOMS/MOOD NEUROLOGICAL BOWEL NUTRITION STATUS      Continent (S) Diet (diabetic)  AMBULATORY STATUS COMMUNICATION OF NEEDS Skin   Extensive Assist Verbally Normal                       Personal Care Assistance Level of Assistance  Bathing, Feeding, Dressing, Total care Bathing Assistance: Maximum assistance Feeding assistance: Independent Dressing Assistance: Limited assistance Total Care Assistance: Limited assistance   Functional Limitations Info    Sight Info: Adequate Hearing Info: Impaired (Hard of hearing) Speech Info: Adequate    SPECIAL CARE FACTORS FREQUENCY  OT (By licensed OT)     PT Frequency: 5x week OT Frequency: 3x week            Contractures      Additional Factors Info  Allergies   Allergies Info: Citalopam,cymbalta,Impramine,Proton Pump Inhibitors, Venlafaxine           Current Medications (06/26/2016):  This is the current hospital active medication list Current Facility-Administered Medications  Medication Dose Route Frequency Provider Last Rate Last Dose  . acetaminophen (TYLENOL) tablet 650 mg  650 mg Oral Q6H PRN Gladstone Lighter, MD       Or  . acetaminophen (TYLENOL) suppository 650 mg  650 mg Rectal Q6H PRN Gladstone Lighter,  MD      . acidophilus (RISAQUAD) capsule 1 capsule  1 capsule Oral BID Gladstone Lighter, MD   1 capsule at 06/26/16 0832  . aspirin EC tablet 81 mg  81 mg Oral Daily Gladstone Lighter, MD   81 mg at 06/26/16 [id number]  . azelastine (ASTELIN) 0.1 % nasal spray 1 spray  1 spray Each Nare PRN Gladstone Lighter, MD      . benzonatate (TESSALON) capsule 200 mg  200 mg Oral TID PRN Gladstone Lighter, MD      . cyanocobalamin ((VITAMIN B-12)) injection 1,000 mcg  1,000 mcg Intramuscular Q30 days Gladstone Lighter, MD   1,000 mcg at 06/23/16 2126  . cycloSPORINE (RESTASIS) 0.05 % ophthalmic emulsion 1  drop  1 drop Both Eyes BID Gladstone Lighter, MD   1 drop at 06/26/16 0832  . enoxaparin (LOVENOX) injection 40 mg  40 mg Subcutaneous Q24H Gladstone Lighter, MD   40 mg at 06/25/16 2012  . gabapentin (NEURONTIN) capsule 300 mg  300 mg Oral BID Gladstone Lighter, MD   300 mg at 06/26/16 [id number]  . HYDROcodone-acetaminophen (NORCO) 10-325 MG per tablet 1 tablet  1 tablet Oral Q4H PRN Gladstone Lighter, MD   1 tablet at 06/25/16 2012  . ipratropium-albuterol (DUONEB) 0.5-2.5 (3) MG/3ML nebulizer solution 3 mL  3 mL Inhalation Q6H PRN Gladstone Lighter, MD      . losartan (COZAAR) tablet 25 mg  25 mg Oral Daily Gladstone Lighter, MD   25 mg at 06/26/16 [id number]  . metoCLOPramide (REGLAN) tablet 10 mg  10 mg Oral Q8H PRN Gladstone Lighter, MD   10 mg at 06/25/16 1006  . metoprolol succinate (TOPROL-XL) 24 hr tablet 50 mg  50 mg Oral Daily Gladstone Lighter, MD   50 mg at 06/26/16 0833  . nitroGLYCERIN (NITROSTAT) SL tablet 0.4 mg  0.4 mg Sublingual Q5 min PRN Gladstone Lighter, MD      . ondansetron (ZOFRAN) tablet 4 mg  4 mg Oral Q6H PRN Gladstone Lighter, MD       Or  . ondansetron (ZOFRAN) injection 4 mg  4 mg Intravenous Q6H PRN Gladstone Lighter, MD   4 mg at 06/24/16 1903  . pantoprazole (PROTONIX) EC tablet 40 mg  40 mg Oral BID Gladstone Lighter, MD   40 mg at 06/26/16 [id number]  . phenazopyridine (PYRIDIUM) tablet 200 mg  200 mg Oral TID PRN Gladstone Lighter, MD      . traMADol Veatrice Bourbon) tablet 50 mg  50 mg Oral Q6H PRN Gladstone Lighter, MD   50 mg at 06/26/16 [id number]  . tuberculin injection 5 Units  5 Units Intradermal Once Bettey Costa, MD   5 Units at 06/24/16 1215  . vancomycin (VANCOCIN) 50 mg/mL oral solution 125 mg  125 mg Oral QID Alexis Hugelmeyer, DO   125 mg at 06/26/16 [id number]     Discharge Medications: Please see discharge summary for a list of discharge medications.  Relevant Imaging Results:  Relevant Lab Results:   Additional Information [id number]  Katisha Shimizu, Veronia Beets, LCSW

## 2016-06-26 NOTE — Clinical Social Work Placement (Signed)
   CLINICAL SOCIAL WORK PLACEMENT  NOTE  Date:  06/26/2016  Patient Details  Name: Michele Schultz MRN: [id number] Date of Birth: Jul 12, 1931  Clinical Social Work is seeking post-discharge placement for this patient at the [place] level of care (*CSW will initial, date and re-position this form in  chart as items are completed):  Yes   Patient/family provided with [facility] Work Department's list of facilities offering this level of care within the geographic area requested by the patient (or if unable, by the patient's family).  Yes   Patient/family informed of their freedom to choose among providers that offer the needed level of care, that participate in Medicare, Medicaid or managed care program needed by the patient, have an available bed and are willing to accept the patient.  Yes   Patient/family informed of [facility]'s ownership interest in [facility] and [facility], as well as of the fact that they are under no obligation to receive care at these facilities.  PASRR submitted to EDS on       PASRR number received on       Existing PASRR number confirmed on 06/26/16     FL2 transmitted to all facilities in geographic area requested by pt/family on 06/26/16     FL2 transmitted to all facilities within larger geographic area on       Patient informed that his/her managed care company has contracts with or will negotiate with certain facilities, including the following:        Yes   Patient/family informed of bed offers received.  Patient chooses bed at  [facility] )     Physician recommends and patient chooses bed at      Patient to be transferred to  [facility] ) on 06/26/16.  Patient to be transferred to facility by  [facility] )     Patient family notified on 06/26/16 of transfer.  Name of family member notified:   ([name] [name] is aware of D/C today. )     PHYSICIAN       Additional  Comment:    _______________________________________________ Delois Silvester, Veronia Beets, LCSW 06/26/2016, 12:45 PM

## 2016-06-26 NOTE — Progress Notes (Signed)
[place] admissions coordinator at [facility] has agreed to accept patient on a 2 week LOG. Clinical Education officer, museum (CSW) Mudlogger approved 2 week LOG. Patient is medically stable for D/C to [facility] today. Patient will go to room 214-A. RN will call report and arrange EMS for transport. CSW sent D/C orders to [facility] via [place]. CSW emphasized to patient and patient's niece [name] that the LOG is only for 2 weeks and patient must hold her SSI check in August in order to move into an ALF within her budget. Please reconsult if future social work needs arise. CSW signing off.   [employer], LCSW [phone]

## 2016-06-26 NOTE — Progress Notes (Signed)
[facility] NAME: Michele Schultz    MR#:  [id number]  DATE OF BIRTH:  [birth year]  SUBJECTIVE:   Diarrhea resolving. Patient is not teary this morning.  REVIEW OF SYSTEMS:    Review of Systems  Constitutional: Negative for chills, fever and malaise/fatigue.  HENT: Negative.  Negative for ear discharge, ear pain, hearing loss, nosebleeds and sore throat.   Eyes: Negative.  Negative for blurred vision and pain.  Respiratory: Negative.  Negative for cough, hemoptysis, shortness of breath and wheezing.   Cardiovascular: Negative.  Negative for chest pain, palpitations and leg swelling.  Gastrointestinal: Positive for diarrhea (improved). Negative for abdominal pain, blood in stool, nausea and vomiting.  Genitourinary: Negative.  Negative for dysuria.  Musculoskeletal: Negative.  Negative for back pain.  Skin: Negative.   Neurological: Positive for weakness. Negative for dizziness, tremors, speech change, focal weakness, seizures and headaches.  Endo/Heme/Allergies: Negative.  Does not bruise/bleed easily.  Psychiatric/Behavioral: Positive for depression and memory loss. Negative for hallucinations and suicidal ideas.    Tolerating Diet:Yes    DRUG ALLERGIES:   Allergies  Allergen Reactions  . Citalopram Other (See Comments)    Reaction:  Altered mental status   . Cymbalta [Duloxetine Hcl] Other (See Comments)    Reaction:  Sedative for pt   . Imipramine Other (See Comments)    Reaction:  Unknown   . Proton Pump Inhibitors Other (See Comments)    Reaction:  Unknown   . Venlafaxine Nausea And Vomiting and Other (See Comments)    Reaction:  Dizziness     VITALS:  Blood pressure (!) 150/68, pulse 91, temperature 98.5 F (36.9 C), temperature source Oral, resp. rate 18, height 5\' 4"  (1.626 m), weight 108.9 kg (240 lb), SpO2 99 %.  PHYSICAL EXAMINATION:   Physical Exam  Constitutional: She is oriented to person, place, and  time and well-developed, well-nourished, and in no distress. No distress.  HENT:  Head: Normocephalic.  Eyes: No scleral icterus.  Neck: Normal range of motion. Neck supple. No JVD present. No tracheal deviation present.  Cardiovascular: Normal rate, regular rhythm and normal heart sounds.  Exam reveals no gallop and no friction rub.   No murmur heard. Pulmonary/Chest: Effort normal and breath sounds normal. No respiratory distress. She has no wheezes. She has no rales. She exhibits no tenderness.  Abdominal: Soft. Bowel sounds are normal. She exhibits no distension and no mass. There is no tenderness. There is no rebound and no guarding.  Musculoskeletal: Normal range of motion. She exhibits no edema.  Neurological: She is alert and oriented to person, place, and time.  Skin: Skin is warm. No rash noted. No erythema.  Psychiatric: Judgment normal.  Depressed teary      LABORATORY PANEL:   CBC  Recent Labs Lab 06/24/16 0506  WBC 6.6  HGB 11.6*  HCT 34.6*  PLT 232   ------------------------------------------------------------------------------------------------------------------  Chemistries   Recent Labs Lab 06/23/16 1449  06/25/16 0430  NA 140  < > 140  K 3.3*  < > 3.7  CL 104  < > 105  CO2 24  < > 28  GLUCOSE 206*  < > 148*  BUN 13  < > 12  CREATININE 0.72  < > 0.62  CALCIUM 9.4  < > 8.8*  MG 1.9  --   --   AST 42*  --   --   ALT 31  --   --   [facility]  91  --   --   BILITOT 0.6  --   --   < > = values in this interval not displayed. ------------------------------------------------------------------------------------------------------------------  Cardiac Enzymes  Recent Labs Lab 06/23/16 1449  TROPONINI <0.03   ------------------------------------------------------------------------------------------------------------------  RADIOLOGY:  No results found.   ASSESSMENT AND PLAN:    82 year old female with a history of fibromyalgia and ASCVD who  presented with diarrhea and found to have C. Difficile.  1. C. difficile diarrhea: Patient will continue oral vancomycin for total of 2 weeks. Diarrhea is improving. Do not use antimotility medications due to C. Difficile.   2. Hypokalemia: Due to diarrhea and has improved.  3. Generalized weakness: PT is recommending skilled nursing facility at discharge. Case management work and disposition.   4. Essential hypertension: Continue Toprol, losartan and Norvasc.  5. ASCVD: Continue aspirin and metoprolol.  6. Depression with underlying congnitive impairment: sue to grief reaction due to the loss of her husband. Psychiatry evaluated patient. She did not want to start antidepressant medications and is not a harm risk She should have outpatient NEURO evaluation for cognitive function.  CODE STATUS: full  TOTAL TIME TAKING CARE OF THIS PATIENT: 2 minutes.     POSSIBLE D/C today depending on Education officer, museum and case management for disposition.   Jamarea Selner M.D on 06/26/2016 at 9:47 AM  Between 7am to 6pm - Pager - [phone] After 6pm go to [email or web address] - password [email or web address] [facility]  Office  [phone]  CC: Primary care physician; [name]  Note: This dictation was prepared with Dragon dictation along with smaller phrase technology. Any transcriptional errors that result from this process are unintentional.

## 2016-06-26 NOTE — Progress Notes (Signed)
PT is recommending SNF. Patient is under Medicare observation and does not have a payer for SNF. Patient was denied Medicaid and makes around 2,600 per month. Clinical Education officer, museum ([place]) discussed case with Engineer, agricultural. Per director if patient and her family are agreeable to hold her August check and use that money to move patient into a ALF/ family care home then [facility] will do a 2 week LOG at [facility].   CSW contacted patient's niece [name] and made her aware of above. Per niece she is agreeable to helping patient hold her check and pay for an ALF. Per [name] her name is on patient's checking account and she has access to it. Per niece she will be agreeable to assist with this is if patient is agreeable. CSW met with patient and made her aware of above. Patient is agreeable to holding her August check and moving into a ALF after 2 weeks at a SNF.   CSW sent out LOG referral and several facilities are reviewing it at this time. CSW will continue to follow and assist as needed.   [employer], LCSW [phone]

## 2016-06-26 NOTE — Progress Notes (Signed)
Physical Therapy Treatment Patient Details Name: Michele Schultz MRN: [id number] DOB: Jun 12, 1931 Today's Date: 06/26/2016    History of Present Illness Patient is an 82 y.o. female admitted on 23 June 2016 after discharging to [facility] 2 days ago. Presents with diarrhea. PMH includes fibromyalgia, HTN, CAD s/p CABG, aortic stenosis, spinal stenosis, and GERD.    PT Comments    Pt in bed excited to participate in therapy.  Participated in exercises as described below. She was able to get to edge of bed with mod A x 1.  Upon sitting she complained of dizziness which relieved somewhat with time but not fully.  Sitting edge of bed without loss of balance.  She was able to stand with min a x 2 from elevated surface.  She stood x 2 and on second attempt was able to take 3 very small sidesteps towards the head of the bed.  On both trials, she sat due to her knees feeling weak and that they would buckle.  Pt stands fully but does push her hips far forward causing her to lean backwards when standing.  She is unsafe at this time to transfer to chair and would recommend sit to stand lift for transfers.   Follow Up Recommendations  SNF     Equipment Recommendations  Rolling walker with 5" wheels    Recommendations for Other Services       Precautions / Restrictions Precautions Precautions: Fall Restrictions Weight Bearing Restrictions: No    Mobility  Bed Mobility Overal bed mobility: Needs Assistance Bed Mobility: Supine to Sit;Sit to Supine   Supine to sit: Mod a +1 Sit to supine: Mod assist;+2 for physical assistance   General bed mobility comments: unable to manage le's to get back to supine to reposition when flat on her back.  Transfers Overall transfer level: Needs assistance Equipment used: Rolling walker (2 wheeled) Transfers: Sit to/from Stand Sit to Stand: Min assist;From elevated surface;+2 physical assistance         General transfer comment: stood x 2, sits  quickly due to le's feeling weak and buckling  Ambulation/Gait Ambulation/Gait assistance: +2 physical assistance;Min assist Ambulation Distance (Feet): 1 Feet (took 3 small sidesteps along bed before sitting) Assistive device: Rolling walker (2 wheeled) Gait Pattern/deviations: Step-to pattern   Gait velocity interpretation: Below normal speed for age/gender General Gait Details: sideways, small steps to move up in bed.  sits quickly, unsafe   Stairs            Wheelchair Mobility    Modified Rankin (Stroke Patients Only)       Balance                                    Cognition Arousal/Alertness: Awake/alert Behavior During Therapy: WFL for tasks assessed/performed Overall Cognitive Status: Within Functional Limits for tasks assessed                      Exercises General Exercises - Lower Extremity Ankle Circles/Pumps: AAROM;10 reps Quad Sets: AROM;Strengthening;10 reps Gluteal Sets: AROM;10 reps;Strengthening Heel Slides: AAROM;Both;20 reps;Supine Hip ABduction/ADduction: AAROM;Both;20 reps;Supine    General Comments        Pertinent Vitals/Pain Pain Assessment: 0-10 Pain Score: 6  Pain Location: "all over" Pain Descriptors / Indicators: [place]  Prior Function            PT Goals (current goals can now be found in the care plan section) Progress towards PT goals: Progressing toward goals    Frequency  Min 2X/week    PT Plan Current plan remains appropriate    Co-evaluation             End of Session Equipment Utilized During Treatment: Gait belt Activity Tolerance: Patient tolerated treatment well;Patient limited by fatigue Patient left: in bed;with call bell/phone within reach;with bed alarm set;with SCD's reapplied     Time: 0902-0931 PT Time Calculation (min) (ACUTE ONLY): 29 min  Charges:  $Gait Training: 8-22 mins $Therapeutic Exercise: 8-22 mins                     G Codes:      Chesley Noon, PTA 06/26/16, 10:24 AM

## 2016-07-03 ENCOUNTER — Non-Acute Institutional Stay (SKILLED_NURSING_FACILITY): Payer: Medicare Other | Admitting: Gerontology

## 2016-07-03 ENCOUNTER — Other Ambulatory Visit: Payer: Self-pay | Admitting: Gerontology

## 2016-07-03 DIAGNOSIS — G44321 Chronic post-traumatic headache, intractable: Secondary | ICD-10-CM | POA: Diagnosis not present

## 2016-07-03 DIAGNOSIS — R51 Headache: Principal | ICD-10-CM

## 2016-07-03 DIAGNOSIS — G44309 Post-traumatic headache, unspecified, not intractable: Secondary | ICD-10-CM | POA: Insufficient documentation

## 2016-07-03 DIAGNOSIS — R519 Headache, unspecified: Secondary | ICD-10-CM

## 2016-07-03 NOTE — Progress Notes (Signed)
Location:  [facility] Service:  SNF [phone]) Provider:  Toni Arthurs, NP-C  [name]  Patient Care Team: [name] as PCP - General (Internal Medicine) Minna Merritts, MD (Cardiology)  Extended Emergency Contact Information Primary Emergency Contact: Salina April, [place] [address] Johnnette Litter of [place] Phone: [phone] Relation: Nephew Secondary Emergency Contact: Knight,Shelia Address: [address]          [place], [place] [address] Johnnette Litter of [place] Phone: [phone] Mobile Phone: [phone] Relation: Niece  Code Status:  DNR Goals of care: Advanced Directive information Advanced Directives 06/23/2016  Does patient have an advance directive? Yes  Type of Advance Directive Living will  Does patient want to make changes to advanced directive? No - Patient declined  Copy of advanced directive(s) in chart? No - copy requested  Would patient like information on creating an advanced directive? -  Pre-existing out of facility DNR order (yellow form or pink MOST form) -     Chief Complaint  Patient presents with  . Acute Visit    HPI:  Pt is a 82 y.o. female seen today for an acute visit for intractable headache. Pt states she has had a frontal headache ever since she fell and hit her head on the bedpost, then fell backwards and hit her head on the floor prior to admission. She describes the pain as constant, waking her up at night, throbbing in the frontal region. She also has nausea. Denies vision changes, aura, tinnitis, confusion, hallucinations, dizziness. No other c/o aside from nausea. No h/o sinus pain, pressure, PND,    Past Medical History:  Diagnosis Date  . Anemia   . Aortic stenosis, severe    a. s/p Magna Ease pericardial tissue valve size 21 mm replacement in 10/2011 for severe AS 11/12; b. echo 07/2015: EF 55-60% mod concentric LVH, GR1DD, LA mildly dilated, PASP 45 mm  Hg  . Cardiac pacemaker -st Judes    11/12  . Chronic diastolic heart failure ([place])    a. echo 2014: EF 55-60%, no RWMA, GR1DD, PASP 47 mm Hg; b. echo 07/2015: EF 55-60% mod concentric LVH, GR1DD, LA mildly dilated, PASP 45 mm Hg  . Complete heart block [facility])    a. s/p St Jude PPM 10/2011 (Ser # [id number]).  . Coronary artery disease    a. s/p 2v CABG 11/12 (VG-LAD, VG-OM1); b. Lexiscan 08/2015: low risk, no ischemia, EF 55-65%.  . Enthesopathy of hip region   . Esophageal reflux    followed by Dr.Seigal. stabilized with a combination of Nexium and Zantac  . Fibromyalgia   . HLD (hyperlipidemia)   . Hypertensive heart disease   . Knee joint replacement by other means   . Morbid obesity ([place])   . Neuralgia, neuritis, and radiculitis, unspecified   . Neuropathy ([place])   . Onychia and paronychia of toe   . Osteoarthrosis, unspecified whether generalized or localized, pelvic region and thigh    mainly in her back and knees  . PAF (paroxysmal atrial fibrillation) ([place])    a. brief episodes of AF previously noted on device interrogations.  Marland Kitchen RAD (reactive airway disease)    a. chronic SOB  . Spinal stenosis   . Stress incontinence, female    followed by Dr.Cope  . Type II or unspecified type diabetes mellitus without mention of complication, not stated as uncontrolled    a. pt. reports that  she is borderline   . Wide-complex tachycardia ([place])    a. Noted 4/10 - brief episode in ED. Noted again 4/21 in clinic->felt most likely to be atrial tach.   Past Surgical History:  Procedure Laterality Date  . AORTIC VALVE REPLACEMENT  10/17/2011   Procedure: AORTIC VALVE REPLACEMENT (AVR);  Surgeon: Melrose Nakayama, MD;  Location: [place];  Service: Open Heart Surgery;  Laterality: N/A;  . CARDIAC CATHETERIZATION  08/2009   50% stenosis distal left main, 50% stenosis ostial left circumflex.   Marland Kitchen CARDIAC CATHETERIZATION     at [facility]  . CORONARY ARTERY BYPASS GRAFT  10/17/2011   Procedure:  CORONARY ARTERY BYPASS GRAFTING (CABG);  Surgeon: Melrose Nakayama, MD;  Location: [place];  Service: Open Heart Surgery;  Laterality: N/A;  Times Two, using right leg greater saphenous vein harvested endoscopically  . EYE SURGERY     IOL/ after cataracts removed- bilateral   . NASAL SINUS SURGERY  2009  . PERMANENT PACEMAKER INSERTION N/A 10/20/2011   Procedure: PERMANENT PACEMAKER INSERTION;  Surgeon: Thompson Grayer, MD;  Location: [facility] CATH LAB;  Service: Cardiovascular;  Laterality: N/A;  . REPLACEMENT TOTAL KNEE  11/2006   right knee  . TOTAL HIP ARTHROPLASTY  09/2010    Allergies  Allergen Reactions  . Citalopram Other (See Comments)    Reaction:  Altered mental status   . Cymbalta [Duloxetine Hcl] Other (See Comments)    Reaction:  Sedative for pt   . Imipramine Other (See Comments)    Reaction:  Unknown   . Proton Pump Inhibitors Other (See Comments)    Reaction:  Unknown   . Venlafaxine Nausea And Vomiting and Other (See Comments)    Reaction:  Dizziness       Medication List       Accurate as of 07/03/16 10:54 PM. Always use your most recent med list.          acidophilus Caps capsule Take 1 capsule by mouth 2 (two) times daily.   albuterol-ipratropium 18-103 MCG/ACT inhaler Commonly known as:  COMBIVENT Inhale 2 puffs into the lungs every 6 (six) hours as needed for wheezing.   amLODipine 5 MG tablet Commonly known as:  NORVASC Take 1 tablet (5 mg total) by mouth daily.   aspirin EC 81 MG tablet Take 81 mg by mouth daily.   azelastine 0.1 % nasal spray Commonly known as:  ASTELIN Place 1 spray into both nostrils as needed for rhinitis.   benzonatate 100 MG capsule Commonly known as:  TESSALON Take 200 mg by mouth 3 (three) times daily as needed for cough.   cyanocobalamin 1000 MCG/ML injection Commonly known as:  (VITAMIN B-12) Inject 1,000 mcg into the muscle every 30 (thirty) days.   cycloSPORINE 0.05 % ophthalmic emulsion Commonly known as:   RESTASIS Place 1 drop into both eyes 2 (two) times daily.   furosemide 20 MG tablet Commonly known as:  LASIX Take 1 tablet (20 mg total) by mouth every other day.   gabapentin 300 MG capsule Commonly known as:  NEURONTIN Take 1 capsule (300 mg total) by mouth 2 (two) times daily.   HYDROcodone-acetaminophen 10-325 MG tablet Commonly known as:  NORCO Take 1 tablet by mouth every 4 (four) hours as needed for moderate pain. Reported on 05/30/2016   losartan 25 MG tablet Commonly known as:  COZAAR Take 25 mg by mouth daily.   magnesium hydroxide 400 MG/5ML suspension Commonly known as:  MILK OF MAGNESIA Take  15 mLs by mouth daily as needed for mild constipation.   metoCLOPramide 10 MG tablet Commonly known as:  REGLAN Take 1 tablet (10 mg total) by mouth every 8 (eight) hours as needed for nausea or vomiting.   metoprolol succinate 50 MG 24 hr tablet Commonly known as:  TOPROL-XL Take 1 tablet (50 mg total) by mouth daily.   nitroGLYCERIN 0.4 MG SL tablet Commonly known as:  NITROSTAT Place 1 tablet (0.4 mg total) under the tongue every 5 (five) minutes as needed for chest pain.   pantoprazole 40 MG tablet Commonly known as:  PROTONIX Take 40 mg by mouth 2 (two) times daily.   phenazopyridine 200 MG tablet Commonly known as:  PYRIDIUM Take 1 tablet (200 mg total) by mouth 3 (three) times daily as needed for pain.   potassium chloride 10 MEQ tablet Commonly known as:  K-DUR Take 10 mEq by mouth daily.   promethazine 25 MG tablet Commonly known as:  PHENERGAN Take 1 tablet (25 mg total) by mouth every 6 (six) hours as needed for nausea or vomiting.   traMADol 50 MG tablet Commonly known as:  ULTRAM Take 1 tablet (50 mg total) by mouth every 6 (six) hours as needed.   vancomycin 50 mg/mL oral solution Commonly known as:  VANCOCIN Take 2.5 mLs (125 mg total) by mouth 4 (four) times daily.       Review of Systems  Constitutional: Positive for appetite change.  Negative for activity change, chills, diaphoresis and fever.  HENT: Negative for congestion, facial swelling, hearing loss, postnasal drip, rhinorrhea, sinus pressure, sneezing, sore throat, tinnitus, trouble swallowing and voice change.   Eyes: Negative for photophobia, pain, redness and visual disturbance.  Respiratory: Negative for apnea, cough, choking, chest tightness, shortness of breath and wheezing.   Cardiovascular: Negative for chest pain, palpitations and leg swelling.  Gastrointestinal: Positive for abdominal pain, diarrhea and nausea. Negative for abdominal distention, constipation and vomiting.  Genitourinary: Negative for difficulty urinating, dysuria, frequency and urgency.  Musculoskeletal: Negative for back pain, gait problem and myalgias. Arthralgias: typical arthritis.  Skin: Negative for color change, pallor, rash and wound.  Neurological: Negative for dizziness, tremors, syncope, facial asymmetry, speech difficulty, weakness, light-headedness, numbness and headaches.  Psychiatric/Behavioral: Negative for agitation and behavioral problems.  All other systems reviewed and are negative.   Immunization History  Administered Date(s) Administered  . PPD Test 03/14/2016, 06/24/2016   Pertinent  Health Maintenance Due  Topic Date Due  . DEXA SCAN  05/27/1998  . PNA vac Low Risk Adult (1 of 2 - PCV13) 05/27/1998  . INFLUENZA VACCINE  07/04/2016   No flowsheet data found. Functional Status Survey:    Vitals:   07/03/16 2251  BP: (!) 146/74  Resp: 20  Temp: 98.4 F (36.9 C)  SpO2: 98%  Weight: 259 lb 12.8 oz (117.8 kg)   Body mass index is 44.59 kg/m. Physical Exam  Constitutional: She is oriented to person, place, and time. Vital signs are normal. She appears well-developed and well-nourished. She is active and cooperative. She does not appear ill. No distress.  HENT:  Head: Normocephalic and atraumatic.  Mouth/Throat: Uvula is midline, oropharynx is clear and  moist and mucous membranes are normal. Mucous membranes are not pale, not dry and not cyanotic.  Eyes: Conjunctivae, EOM and lids are normal. Pupils are equal, round, and reactive to light.  Neck: Trachea normal, normal range of motion, full passive range of motion without pain and phonation normal. Neck supple.  Normal carotid pulses and no JVD present. No tracheal tenderness present. Carotid bruit is not present. No tracheal deviation, no edema and no erythema present. No thyroid mass and no thyromegaly present.  Cardiovascular: Normal rate, regular rhythm, normal heart sounds, intact distal pulses and normal pulses.  Exam reveals no gallop, no distant heart sounds and no friction rub.   No murmur heard. Negative temporal pulsations  Pulmonary/Chest: Effort normal. No accessory muscle usage. No respiratory distress. She has no wheezes. She has no rhonchi. She has no rales. She exhibits no tenderness.  Abdominal: Normal appearance and bowel sounds are normal. She exhibits no distension and no ascites. There is no tenderness.  Musculoskeletal: Normal range of motion. She exhibits no edema or tenderness.  Expected osteoarthritis, stiffness  Lymphadenopathy:       Head (right side): No submental, no submandibular, no tonsillar, no preauricular, no posterior auricular and no occipital adenopathy present.       Head (left side): No submental, no submandibular, no tonsillar, no preauricular, no posterior auricular and no occipital adenopathy present.  Neurological: She is alert and oriented to person, place, and time. She has normal strength. She is not disoriented.  Skin: Skin is warm, dry and intact. She is not diaphoretic. No cyanosis. No pallor. Nails show no clubbing.  Psychiatric: She has a normal mood and affect. Her speech is normal and behavior is normal. Judgment and thought content normal. Cognition and memory are normal.  Nursing note and vitals reviewed.   Labs reviewed:  Recent Labs   03/24/16 1520  06/17/16 0755  06/23/16 1449 06/24/16 0506 06/25/16 0430  NA 141  < >  --   --  140 139 140  K 4.4  < >  --   --  3.3* 3.9 3.7  CL 100  < >  --   --  104 105 105  CO2 25  < >  --   --  _0 GLUCOSE 171*  < >  --   --  206* 165* 148*  BUN 16  < >  --   --  _1 CREATININE 0.73  < >  --   < > 0.72 0.59 0.62  CALCIUM 9.5  < >  --   --  9.4 8.7* 8.8*  MG 2.2  --  2.1  --  1.9  --   --   < > = values in this interval not displayed.  Recent Labs  04/23/16 1509 06/16/16 1750 06/23/16 1449  AST 29 36 42*  ALT _2 ALKPHOS 96 88 91  BILITOT 0.7 0.6 0.6  PROT 8.1 7.5 7.6  ALBUMIN 4.2 3.9 3.7    Recent Labs  04/15/16 1920  06/05/16 0133 06/16/16 1658 06/19/16 0448 06/23/16 1449 06/24/16 0506  WBC 9.2  < > 7.8 9.0 8.3 8.2 6.6  NEUTROABS 6.7*  --  5.0 5.7  --   --   --   HGB 11.6*  < > 11.9* 13.0 12.6 12.3 11.6*  HCT 36.0  < > 36.1 40.7 36.8 37.5 34.6*  MCV 82.8  < > 83.2 85.6 83.4 84.5 83.8  PLT 242  < > 220 258 242 257 232  < > = values in this interval not displayed. Lab Results  Component Value Date   TSH 0.817 03/24/2016   Lab Results  Component Value Date   HGBA1C 6.6 (H) 10/16/2011   No results found for: CHOL, HDL, LDLCALC, LDLDIRECT,  TRIG, CHOLHDL  Significant Diagnostic Results in last 30 days:  Dg Shoulder Right  Result Date: 06/05/2016 CLINICAL DATA:  Dizziness and fall while in the bathroom injuring right shoulder. Right shoulder pain. EXAM: RIGHT SHOULDER - 2+ VIEW COMPARISON:  Right shoulder radiographs 06/01/2014 FINDINGS: No acute fracture or dislocation. Glenohumeral and acromioclavicular joint degenerative change, stable in degree from prior. No abnormal soft tissue calcifications. IMPRESSION: Degenerative change of the right shoulder without acute fracture or subluxation. Electronically Signed   By: Jeb Levering M.D.   On: 06/05/2016 02:31   Ct Head Wo Contrast  Result Date: 06/23/2016 CLINICAL DATA:  Fall hitting  both front and back of her head. Persistent nausea, vomiting, and diarrhea. New onset headache. EXAM: CT HEAD WITHOUT CONTRAST CT CERVICAL SPINE WITHOUT CONTRAST TECHNIQUE: Multidetector CT imaging of the head and cervical spine was performed following the standard protocol without intravenous contrast. Multiplanar CT image reconstructions of the cervical spine were also generated. COMPARISON:  CT of the head 06/16/2016. CT of the head and cervical spine 06/05/2016 FINDINGS: CT HEAD FINDINGS Mild generalized atrophy and white matter disease is stable. No acute cortical infarct, hemorrhage, or mass lesion is present. Basal ganglia are intact. Insular ribbon is normal. A remote right cerebellar infarct is stable. The brainstem and cerebellum are otherwise unremarkable. No acute hemorrhage is present. No significant extracranial soft tissue trauma is evident. The calvarium is intact. 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 Atherosclerotic calcifications are present within the cavernous internal carotid arteries. Bilateral lens replacements are present. The globes and orbits are otherwise intact. CT CERVICAL SPINE FINDINGS Cervical spine is imaged from the skullbase through the T2-3 level. Vertebral body heights and alignment are maintained. No acute fracture traumatic subluxation is present. Straightening of the normal cervical lordosis is stable. Atherosclerotic calcifications are noted at the carotid bifurcations bilaterally. The carotid bifurcations are medial at the level of C2-3. The soft tissues of the neck are otherwise unremarkable. The lung apices are clear. Atherosclerotic calcifications are present at the aortic arch. IMPRESSION: 1. No acute intracranial abnormality or significant interval change. 2. No significant extracranial trauma. 3. Stable atrophy and white matter disease. 4. Multilevel spondylosis of the cervical spine without significant change. No acute fracture traumatic subluxation. 5.  Atherosclerosis evident at the aortic arch and carotid bifurcations bilaterally. Electronically Signed   By: San Morelle M.D.   On: 06/23/2016 17:03   Ct Head Wo Contrast  Result Date: 06/16/2016 CLINICAL DATA:  Head injury and nausea after fall without reported loss consciousness. EXAM: CT HEAD WITHOUT CONTRAST TECHNIQUE: Contiguous axial images were obtained from the base of the skull through the vertex without intravenous contrast. COMPARISON:  CT scan June 05, 2016. FINDINGS: Bony calvarium appears intact. Mild diffuse cortical atrophy is noted. Mild chronic ischemic white matter disease is noted. No mass effect or midline shift is noted. Ventricular size is within normal limits. There is no evidence of mass lesion, hemorrhage or acute infarction. IMPRESSION: Mild diffuse cortical atrophy. Mild chronic ischemic white matter disease. No acute intracranial abnormality seen. Electronically Signed   By: Marijo Conception, M.D.   On: 06/16/2016 14:30   Ct Head Wo Contrast  Result Date: 06/05/2016 CLINICAL DATA:  Acute onset of dizziness and fall. Right-sided head pain. Concern for cervical spine injury. Initial encounter. EXAM: CT HEAD WITHOUT CONTRAST CT CERVICAL SPINE WITHOUT CONTRAST TECHNIQUE: Multidetector CT imaging of the head and cervical spine was performed following the standard protocol without intravenous contrast. Multiplanar CT image  reconstructions of the cervical spine were also generated. COMPARISON:  CT of the head performed 09/15/2015, and MRI of the brain performed 09/08/2008 FINDINGS: CT HEAD FINDINGS There is no evidence of acute infarction, mass lesion, or intra- or extra-axial hemorrhage on CT. Prominence of the ventricles and sulci reflects moderate cortical volume loss. Mild periventricular and subcortical white matter change likely reflects small vessel ischemic microangiopathy. The brainstem and fourth ventricle are within normal limits. The basal ganglia are unremarkable in  appearance. The cerebral hemispheres demonstrate grossly normal gray-white differentiation. No mass effect or midline shift is seen. There is no evidence of fracture; visualized osseous structures are unremarkable in appearance. The orbits are within normal limits. The paranasal sinuses and mastoid air cells are well-aerated. No significant soft tissue abnormalities are seen. CT CERVICAL SPINE FINDINGS There is no evidence of fracture or subluxation. Vertebral bodies demonstrate normal height and alignment. Mild multilevel disc space narrowing is noted along the cervical spine, with small anterior and posterior disc osteophyte complexes. Prevertebral soft tissues are not well assessed due to retropharyngeal common carotid arteries. The thyroid gland is unremarkable in appearance. The visualized lung apices are clear. Dense calcification is noted at the left carotid bifurcation, likely resulting in relatively severe luminal narrowing. IMPRESSION: 1. No evidence of traumatic intracranial injury or fracture. 2. No evidence of fracture or subluxation along the cervical spine. 3. Moderate cortical volume loss and scattered small vessel ischemic microangiopathy. 4. Mild degenerative change along the cervical spine. 5. Dense calcification at the left carotid bifurcation, likely resulting in relatively severe luminal narrowing. Carotid ultrasound would be helpful for further evaluation, when and as deemed clinically appropriate. Electronically Signed   By: Garald Balding M.D.   On: 06/05/2016 02:19   Ct Cervical Spine Wo Contrast  Result Date: 06/23/2016 CLINICAL DATA:  Fall hitting both front and back of her head. Persistent nausea, vomiting, and diarrhea. New onset headache. EXAM: CT HEAD WITHOUT CONTRAST CT CERVICAL SPINE WITHOUT CONTRAST TECHNIQUE: Multidetector CT imaging of the head and cervical spine was performed following the standard protocol without intravenous contrast. Multiplanar CT image reconstructions  of the cervical spine were also generated. COMPARISON:  CT of the head 06/16/2016. CT of the head and cervical spine 06/05/2016 FINDINGS: CT HEAD FINDINGS Mild generalized atrophy and white matter disease is stable. No acute cortical infarct, hemorrhage, or mass lesion is present. Basal ganglia are intact. Insular ribbon is normal. A remote right cerebellar infarct is stable. The brainstem and cerebellum are otherwise unremarkable. No acute hemorrhage is present. No significant extracranial soft tissue trauma is evident. The calvarium is intact. 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 Atherosclerotic calcifications are present within the cavernous internal carotid arteries. Bilateral lens replacements are present. The globes and orbits are otherwise intact. CT CERVICAL SPINE FINDINGS Cervical spine is imaged from the skullbase through the T2-3 level. Vertebral body heights and alignment are maintained. No acute fracture traumatic subluxation is present. Straightening of the normal cervical lordosis is stable. Atherosclerotic calcifications are noted at the carotid bifurcations bilaterally. The carotid bifurcations are medial at the level of C2-3. The soft tissues of the neck are otherwise unremarkable. The lung apices are clear. Atherosclerotic calcifications are present at the aortic arch. IMPRESSION: 1. No acute intracranial abnormality or significant interval change. 2. No significant extracranial trauma. 3. Stable atrophy and white matter disease. 4. Multilevel spondylosis of the cervical spine without significant change. No acute fracture traumatic subluxation. 5. Atherosclerosis evident at the aortic arch and carotid bifurcations  bilaterally. Electronically Signed   By: San Morelle M.D.   On: 06/23/2016 17:03   Ct Cervical Spine Wo Contrast  Result Date: 06/05/2016 CLINICAL DATA:  Acute onset of dizziness and fall. Right-sided head pain. Concern for cervical spine injury. Initial encounter.  EXAM: CT HEAD WITHOUT CONTRAST CT CERVICAL SPINE WITHOUT CONTRAST TECHNIQUE: Multidetector CT imaging of the head and cervical spine was performed following the standard protocol without intravenous contrast. Multiplanar CT image reconstructions of the cervical spine were also generated. COMPARISON:  CT of the head performed 09/15/2015, and MRI of the brain performed 09/08/2008 FINDINGS: CT HEAD FINDINGS There is no evidence of acute infarction, mass lesion, or intra- or extra-axial hemorrhage on CT. Prominence of the ventricles and sulci reflects moderate cortical volume loss. Mild periventricular and subcortical white matter change likely reflects small vessel ischemic microangiopathy. The brainstem and fourth ventricle are within normal limits. The basal ganglia are unremarkable in appearance. The cerebral hemispheres demonstrate grossly normal gray-white differentiation. No mass effect or midline shift is seen. There is no evidence of fracture; visualized osseous structures are unremarkable in appearance. The orbits are within normal limits. The paranasal sinuses and mastoid air cells are well-aerated. No significant soft tissue abnormalities are seen. CT CERVICAL SPINE FINDINGS There is no evidence of fracture or subluxation. Vertebral bodies demonstrate normal height and alignment. Mild multilevel disc space narrowing is noted along the cervical spine, with small anterior and posterior disc osteophyte complexes. Prevertebral soft tissues are not well assessed due to retropharyngeal common carotid arteries. The thyroid gland is unremarkable in appearance. The visualized lung apices are clear. Dense calcification is noted at the left carotid bifurcation, likely resulting in relatively severe luminal narrowing. IMPRESSION: 1. No evidence of traumatic intracranial injury or fracture. 2. No evidence of fracture or subluxation along the cervical spine. 3. Moderate cortical volume loss and scattered small vessel  ischemic microangiopathy. 4. Mild degenerative change along the cervical spine. 5. Dense calcification at the left carotid bifurcation, likely resulting in relatively severe luminal narrowing. Carotid ultrasound would be helpful for further evaluation, when and as deemed clinically appropriate. Electronically Signed   By: Garald Balding M.D.   On: 06/05/2016 02:19   Ct Abdomen Pelvis W Contrast  Result Date: 06/23/2016 CLINICAL DATA:  Nausea, vomiting, diarrhea, and weakness. EXAM: CT ABDOMEN AND PELVIS WITH CONTRAST TECHNIQUE: Multidetector CT imaging of the abdomen and pelvis was performed using the standard protocol following bolus administration of intravenous contrast. CONTRAST:  159m ISOVUE-300 IOPAMIDOL (ISOVUE-300) INJECTION 61% COMPARISON:  04/15/2016 FINDINGS: Mild scarring and subpleural reticulation at the lung bases are similar to the prior study. No pleural effusion. Diffusely decreased attenuation of the liver is consistent with steatosis. Tiny layering stones are present in the gallbladder. There is no biliary dilatation. The spleen, adrenal glands, and pancreas are unremarkable. Small bilateral nonobstructive renal calculi are unchanged. An 11 mm cyst in the anterior interpolar left kidney is unchanged. A subcentimeter hypodensity in the upper pole the left kidney is too small to characterize. There is a small sliding hiatal hernia. A small lipoma in the duodenum is unchanged. Oral contrast is present in nondilated loops of proximal small bowel without evidence of bowel obstruction. The appendix is unremarkable. Diffuse aortoiliac atherosclerosis is noted. No free fluid or enlarged lymph nodes are identified. The bladder is unremarkable. The uterus and ovaries are unremarkable. Subcutaneous injection sites in the anterior abdominal wall. Prior right total hip arthroplasty. Moderate lower lumbar facet arthrosis with slight anterolisthesis of L3  on L4 and L4 on L5 and slight retrolisthesis of L5  on S1, unchanged. IMPRESSION: 1. No acute abnormality identified in the abdomen or pelvis. 2. Nonobstructing nephrolithiasis bilaterally. 3. Aortic atherosclerosis. Electronically Signed   By: Logan Bores M.D.   On: 06/23/2016 19:59    Assessment/Plan 1. Intractable chronic post-traumatic headache  MRI brain r/o bleed, tumor, concussion. Melatonin 10 mg po QHS for chronic headache; labs;   Family/ staff Communication:   Total Time: 45 minutes  Documentation: 15 minutes  Face to Face: 30 minutes  Family/Phone:   Labs/tests ordered:  MRI- brain; cbc, met c, b12, ESR, mag+  Medication list reviewed and assessed for continued appropriateness.  Vikki Ports, NP-C Geriatrics [facility] [phone], [place] [address] Cell Phone (Mon-Fri 8am-5pm):  [phone] On Call:  [phone] & follow prompts after 5pm & weekends Office Phone:  [phone] Office Fax:  [phone]

## 2016-07-04 ENCOUNTER — Encounter
Admission: RE | Admit: 2016-07-04 | Discharge: 2016-07-04 | Disposition: A | Discharge status: Home / Self Care | Payer: Medicare Other | Source: Ambulatory Visit | Attending: Internal Medicine | Admitting: Internal Medicine

## 2016-07-04 DIAGNOSIS — G44319 Acute post-traumatic headache, not intractable: Secondary | ICD-10-CM | POA: Diagnosis not present

## 2016-07-04 LAB — COMPREHENSIVE METABOLIC PANEL
ALT: 21 U/L (ref 14–54)
AST: 24 U/L (ref 15–41)
Albumin: 3.8 g/dL (ref 3.5–5.0)
Alkaline Phosphatase: 81 U/L (ref 38–126)
Anion gap: 10 (ref 5–15)
BUN: 13 mg/dL (ref 6–20)
CHLORIDE: 102 mmol/L (ref 101–111)
CO2: 25 mmol/L (ref 22–32)
CREATININE: 0.65 mg/dL (ref 0.44–1.00)
Calcium: 9.3 mg/dL (ref 8.9–10.3)
GFR calc non Af Amer: >60 mL/min (ref >60)
Glucose, Bld: 186 mg/dL — ABNORMAL HIGH (ref 65–99)
Potassium: 3 mmol/L — ABNORMAL LOW (ref 3.5–5.1)
SODIUM: 137 mmol/L (ref 135–145)
Total Bilirubin: 0.8 mg/dL (ref 0.3–1.2)
Total Protein: 7.4 g/dL (ref 6.5–8.1)

## 2016-07-04 LAB — CBC WITH DIFFERENTIAL/PLATELET
BASOS ABS: 0.1 10*3/uL (ref 0–0.1)
BASOS PCT: 1 %
EOS ABS: 0.1 10*3/uL (ref 0–0.7)
Eosinophils Relative: 1 %
HCT: 36.4 % (ref 35.0–47.0)
HEMOGLOBIN: 12.3 g/dL (ref 12.0–16.0)
Lymphocytes Relative: 30 %
Lymphs Abs: 2.9 10*3/uL (ref 1.0–3.6)
MCH: 27.8 pg (ref 26.0–34.0)
MCHC: 33.7 g/dL (ref 32.0–36.0)
MCV: 82.6 fL (ref 80.0–100.0)
Monocytes Absolute: 0.8 10*3/uL (ref 0.2–0.9)
Monocytes Relative: 8 %
NEUTROS PCT: 60 %
Neutro Abs: 6 10*3/uL (ref 1.4–6.5)
Platelets: 287 10*3/uL (ref 150–440)
RBC: 4.41 MIL/uL (ref 3.80–5.20)
RDW: 16.2 % — ABNORMAL HIGH (ref 11.5–14.5)
WBC: 9.8 10*3/uL (ref 3.6–11.0)

## 2016-07-04 LAB — VITAMIN B12: VITAMIN B 12: 913 pg/mL (ref 180–914)

## 2016-07-04 LAB — MAGNESIUM: MAGNESIUM: 1.8 mg/dL (ref 1.7–2.4)

## 2016-07-04 LAB — SEDIMENTATION RATE: Sed Rate: 37 mm/hr — ABNORMAL HIGH (ref 0–30)

## 2016-07-06 DIAGNOSIS — G44319 Acute post-traumatic headache, not intractable: Secondary | ICD-10-CM | POA: Diagnosis not present

## 2016-07-06 LAB — BASIC METABOLIC PANEL
Anion gap: 11 (ref 5–15)
BUN: 14 mg/dL (ref 6–20)
CHLORIDE: 102 mmol/L (ref 101–111)
CO2: 25 mmol/L (ref 22–32)
CREATININE: 0.63 mg/dL (ref 0.44–1.00)
Calcium: 8.9 mg/dL (ref 8.9–10.3)
GFR calc Af Amer: >60 mL/min (ref >60)
GFR calc non Af Amer: >60 mL/min (ref >60)
GLUCOSE: 197 mg/dL — AB (ref 65–99)
POTASSIUM: 3.4 mmol/L — AB (ref 3.5–5.1)
SODIUM: 138 mmol/L (ref 135–145)

## 2016-07-10 ENCOUNTER — Non-Acute Institutional Stay (SKILLED_NURSING_FACILITY): Payer: Medicare Other | Admitting: Gerontology

## 2016-07-10 DIAGNOSIS — A09 Infectious gastroenteritis and colitis, unspecified: Secondary | ICD-10-CM | POA: Diagnosis not present

## 2016-07-10 NOTE — Progress Notes (Signed)
Location:      Place of Service:  SNF (31)  Provider: Toni Arthurs, NP-C  PCP: [name] Patient Care Team: [name] as PCP - General (Internal Medicine) Minna Merritts, MD (Cardiology)  Extended Emergency Contact Information Primary Emergency Contact: Salina April, [place] [address] Johnnette Litter of [place] Phone: [phone] Relation: Nephew Secondary Emergency Contact: Knight,Shelia Address: [address]          [place], [place] [address] Johnnette Litter of [place] Phone: [phone] Mobile Phone: [phone] Relation: Niece  Code Status: DNR Goals of care:  Advanced Directive information Advanced Directives 06/23/2016  Does patient have an advance directive? Yes  Type of Advance Directive Living will  Does patient want to make changes to advanced directive? No - Patient declined  Copy of advanced directive(s) in chart? No - copy requested  Would patient like information on creating an advanced directive? -  Pre-existing out of facility DNR order (yellow form or pink MOST form) -     Allergies  Allergen Reactions  . Citalopram Other (See Comments)    Reaction:  Altered mental status   . Cymbalta [Duloxetine Hcl] Other (See Comments)    Reaction:  Sedative for pt   . Imipramine Other (See Comments)    Reaction:  Unknown   . Proton Pump Inhibitors Other (See Comments)    Reaction:  Unknown   . Venlafaxine Nausea And Vomiting and Other (See Comments)    Reaction:  Dizziness     Chief Complaint  Patient presents with  . Discharge Note    HPI:  82 y.o. female  was admitted with diarrhea d/t C-Diff. Received PT/OT for decreased strength and weakness. Improvement was noted. Completed ABT tx for C-Diff with no adverse reactions noted. Discharging home to [facility] being transported by family.     Past Medical History:  Diagnosis Date  . Anemia   . Aortic stenosis, severe    a. s/p Magna Ease pericardial  tissue valve size 21 mm replacement in 10/2011 for severe AS 11/12; b. echo 07/2015: EF 55-60% mod concentric LVH, GR1DD, LA mildly dilated, PASP 45 mm Hg  . Cardiac pacemaker -st Judes    11/12  . Chronic diastolic heart failure ([place])    a. echo 2014: EF 55-60%, no RWMA, GR1DD, PASP 47 mm Hg; b. echo 07/2015: EF 55-60% mod concentric LVH, GR1DD, LA mildly dilated, PASP 45 mm Hg  . Complete heart block [facility])    a. s/p St Jude PPM 10/2011 (Ser # [id number]).  . Coronary artery disease    a. s/p 2v CABG 11/12 (VG-LAD, VG-OM1); b. Lexiscan 08/2015: low risk, no ischemia, EF 55-65%.  . Enthesopathy of hip region   . Esophageal reflux    followed by Dr.Seigal. stabilized with a combination of Nexium and Zantac  . Fibromyalgia   . HLD (hyperlipidemia)   . Hypertensive heart disease   . Knee joint replacement by other means   . Morbid obesity ([place])   . Neuralgia, neuritis, and radiculitis, unspecified   . Neuropathy ([place])   . Onychia and paronychia of toe   . Osteoarthrosis, unspecified whether generalized or localized, pelvic region and thigh    mainly in her back and knees  . PAF (paroxysmal atrial fibrillation) ([place])    a. brief episodes of AF previously noted on device interrogations.  Marland Kitchen RAD (reactive airway disease)    a. chronic SOB  . Spinal stenosis   .  Stress incontinence, female    followed by Dr.Cope  . Type II or unspecified type diabetes mellitus without mention of complication, not stated as uncontrolled    a. pt. reports that she is borderline   . Wide-complex tachycardia ([place])    a. Noted 4/10 - brief episode in ED. Noted again 4/21 in clinic->felt most likely to be atrial tach.    Past Surgical History:  Procedure Laterality Date  . AORTIC VALVE REPLACEMENT  10/17/2011   Procedure: AORTIC VALVE REPLACEMENT (AVR);  Surgeon: Melrose Nakayama, MD;  Location: [place];  Service: Open Heart Surgery;  Laterality: N/A;  . CARDIAC CATHETERIZATION  08/2009   50% stenosis distal  left main, 50% stenosis ostial left circumflex.   Marland Kitchen CARDIAC CATHETERIZATION     at [facility]  . CORONARY ARTERY BYPASS GRAFT  10/17/2011   Procedure: CORONARY ARTERY BYPASS GRAFTING (CABG);  Surgeon: Melrose Nakayama, MD;  Location: [place];  Service: Open Heart Surgery;  Laterality: N/A;  Times Two, using right leg greater saphenous vein harvested endoscopically  . EYE SURGERY     IOL/ after cataracts removed- bilateral   . NASAL SINUS SURGERY  2009  . PERMANENT PACEMAKER INSERTION N/A 10/20/2011   Procedure: PERMANENT PACEMAKER INSERTION;  Surgeon: Thompson Grayer, MD;  Location: [facility] C[facility] LAB;  Service: Cardiovascular;  Laterality: N/A;  . REPLACEMENT TOTAL KNEE  11/2006   right knee  . TOTAL HIP ARTHROPLASTY  09/2010      reports that she has never smoked. She has never used smokeless tobacco. She reports that she does not drink alcohol or use drugs. Social History   Social History  . Marital status: Widowed    Spouse name: N/A  . Number of children: N/A  . Years of education: N/A   Occupational History  . Not on file.   Social History Main Topics  . Smoking status: Never Smoker  . Smokeless tobacco: Never Used  . Alcohol use No  . Drug use: No  . Sexual activity: Not on file   Other Topics Concern  . Not on file   Social History Narrative  . No narrative on file   Functional Status Survey:    Allergies  Allergen Reactions  . Citalopram Other (See Comments)    Reaction:  Altered mental status   . Cymbalta [Duloxetine Hcl] Other (See Comments)    Reaction:  Sedative for pt   . Imipramine Other (See Comments)    Reaction:  Unknown   . Proton Pump Inhibitors Other (See Comments)    Reaction:  Unknown   . Venlafaxine Nausea And Vomiting and Other (See Comments)    Reaction:  Dizziness     Pertinent  Health Maintenance Due  Topic Date Due  . DEXA SCAN  05/27/1998  . PNA vac Low Risk Adult (1 of 2 - PCV13) 05/27/1998  . INFLUENZA VACCINE  07/04/2016     Medications:   Medication List       Accurate as of 07/10/16  4:31 PM. Always use your most recent med list.          acidophilus Caps capsule Take 1 capsule by mouth 2 (two) times daily.   albuterol-ipratropium 18-103 MCG/ACT inhaler Commonly known as:  COMBIVENT Inhale 2 puffs into the lungs every 6 (six) hours as needed for wheezing.   amLODipine 5 MG tablet Commonly known as:  NORVASC Take 1 tablet (5 mg total) by mouth daily.   aspirin EC 81 MG tablet Take  81 mg by mouth daily.   azelastine 0.1 % nasal spray Commonly known as:  ASTELIN Place 1 spray into both nostrils as needed for rhinitis.   benzonatate 100 MG capsule Commonly known as:  TESSALON Take 200 mg by mouth 3 (three) times daily as needed for cough.   cyanocobalamin 1000 MCG/ML injection Commonly known as:  (VITAMIN B-12) Inject 1,000 mcg into the muscle every 30 (thirty) days.   cycloSPORINE 0.05 % ophthalmic emulsion Commonly known as:  RESTASIS Place 1 drop into both eyes 2 (two) times daily.   furosemide 20 MG tablet Commonly known as:  LASIX Take 1 tablet (20 mg total) by mouth every other day.   gabapentin 300 MG capsule Commonly known as:  NEURONTIN Take 1 capsule (300 mg total) by mouth 2 (two) times daily.   HYDROcodone-acetaminophen 10-325 MG tablet Commonly known as:  NORCO Take 1 tablet by mouth every 4 (four) hours as needed for moderate pain. Reported on 05/30/2016   losartan 25 MG tablet Commonly known as:  COZAAR Take 25 mg by mouth daily.   magnesium hydroxide 400 MG/5ML suspension Commonly known as:  MILK OF MAGNESIA Take 15 mLs by mouth daily as needed for mild constipation.   metoCLOPramide 10 MG tablet Commonly known as:  REGLAN Take 1 tablet (10 mg total) by mouth every 8 (eight) hours as needed for nausea or vomiting.   metoprolol succinate 50 MG 24 hr tablet Commonly known as:  TOPROL-XL Take 1 tablet (50 mg total) by mouth daily.   nitroGLYCERIN 0.4 MG SL  tablet Commonly known as:  NITROSTAT Place 1 tablet (0.4 mg total) under the tongue every 5 (five) minutes as needed for chest pain.   pantoprazole 40 MG tablet Commonly known as:  PROTONIX Take 40 mg by mouth 2 (two) times daily.   potassium chloride 10 MEQ tablet Commonly known as:  K-DUR Take 10 mEq by mouth daily.   promethazine 25 MG tablet Commonly known as:  PHENERGAN Take 1 tablet (25 mg total) by mouth every 6 (six) hours as needed for nausea or vomiting.   traMADol 50 MG tablet Commonly known as:  ULTRAM Take 1 tablet (50 mg total) by mouth every 6 (six) hours as needed.       Review of Systems  Vitals:   07/10/16 1628  BP: (!) 146/70  Pulse: 77  Resp: 20  Temp: 98.3 F (36.8 C)  SpO2: 99%   There is no height or weight on file to calculate BMI. Physical Exam  Labs reviewed: Basic Metabolic Panel:  Recent Labs  06/17/16 0755  06/23/16 1449  06/25/16 0430 07/04/16 1019 07/06/16 0705  NA  --   --  140  < > 140 137 138  K  --   --  3.3*  < > 3.7 3.0* 3.4*  CL  --   --  104  < > 105 102 102  CO2  --   --  24  < > 28 25 25   GLUCOSE  --   --  206*  < > 148* 186* 197*  BUN  --   --  13  < > 12 13 14   CREATININE  --   < > 0.72  < > 0.62 0.65 0.63  CALCIUM  --   --  9.4  < > 8.8* 9.3 8.9  MG 2.1  --  1.9  --   --  1.8  --   < > = values in this  interval not displayed. Liver Function Tests:  Recent Labs  06/16/16 1750 06/23/16 1449 07/04/16 1019  AST 36 42* 24  ALT 27 31 21   ALKPHOS 88 91 81  BILITOT 0.6 0.6 0.8  PROT 7.5 7.6 7.4  ALBUMIN 3.9 3.7 3.8    Recent Labs  04/15/16 1920 04/23/16 1509 06/23/16 1449  LIPASE 31 26 34   No results for input(s): AMMONIA in the last 8760 hours. CBC:  Recent Labs  06/05/16 0133 06/16/16 1658  06/23/16 1449 06/24/16 0506 07/04/16 1019  WBC 7.8 9.0  < > 8.2 6.6 9.8  NEUTROABS 5.0 5.7  --   --   --  6.0  HGB 11.9* 13.0  < > 12.3 11.6* 12.3  HCT 36.1 40.7  < > 37.5 34.6* 36.4  MCV 83.2 85.6   < > 84.5 83.8 82.6  PLT 220 258  < > 257 232 287  < > = values in this interval not displayed. Cardiac Enzymes:  Recent Labs  06/05/16 0133 06/16/16 1750 06/23/16 1449  TROPONINI <0.03 <0.03 <0.03   BNP: Invalid input(s): POCBNP CBG:  Recent Labs  09/17/15 2121 09/18/15 0733 09/18/15 1144  GLUCAP 181* 139* 111*    Procedures and Imaging Studies During Stay: Ct Head Wo Contrast  Result Date: 06/23/2016 CLINICAL DATA:  Fall hitting both front and back of her head. Persistent nausea, vomiting, and diarrhea. New onset headache. EXAM: CT HEAD WITHOUT CONTRAST CT CERVICAL SPINE WITHOUT CONTRAST TECHNIQUE: Multidetector CT imaging of the head and cervical spine was performed following the standard protocol without intravenous contrast. Multiplanar CT image reconstructions of the cervical spine were also generated. COMPARISON:  CT of the head 06/16/2016. CT of the head and cervical spine 06/05/2016 FINDINGS: CT HEAD FINDINGS Mild generalized atrophy and white matter disease is stable. No acute cortical infarct, hemorrhage, or mass lesion is present. Basal ganglia are intact. Insular ribbon is normal. A remote right cerebellar infarct is stable. The brainstem and cerebellum are otherwise unremarkable. No acute hemorrhage is present. No significant extracranial soft tissue trauma is evident. The calvarium is intact. 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 Atherosclerotic calcifications are present within the cavernous internal carotid arteries. Bilateral lens replacements are present. The globes and orbits are otherwise intact. CT CERVICAL SPINE FINDINGS Cervical spine is imaged from the skullbase through the T2-3 level. Vertebral body heights and alignment are maintained. No acute fracture traumatic subluxation is present. Straightening of the normal cervical lordosis is stable. Atherosclerotic calcifications are noted at the carotid bifurcations bilaterally. The carotid bifurcations are  medial at the level of C2-3. The soft tissues of the neck are otherwise unremarkable. The lung apices are clear. Atherosclerotic calcifications are present at the aortic arch. IMPRESSION: 1. No acute intracranial abnormality or significant interval change. 2. No significant extracranial trauma. 3. Stable atrophy and white matter disease. 4. Multilevel spondylosis of the cervical spine without significant change. No acute fracture traumatic subluxation. 5. Atherosclerosis evident at the aortic arch and carotid bifurcations bilaterally. Electronically Signed   By: San Morelle M.D.   On: 06/23/2016 17:03   Ct Head Wo Contrast  Result Date: 06/16/2016 CLINICAL DATA:  Head injury and nausea after fall without reported loss consciousness. EXAM: CT HEAD WITHOUT CONTRAST TECHNIQUE: Contiguous axial images were obtained from the base of the skull through the vertex without intravenous contrast. COMPARISON:  CT scan June 05, 2016. FINDINGS: Bony calvarium appears intact. Mild diffuse cortical atrophy is noted. Mild chronic ischemic white matter disease is noted.  No mass effect or midline shift is noted. Ventricular size is within normal limits. There is no evidence of mass lesion, hemorrhage or acute infarction. IMPRESSION: Mild diffuse cortical atrophy. Mild chronic ischemic white matter disease. No acute intracranial abnormality seen. Electronically Signed   By: Marijo Conception, M.D.   On: 06/16/2016 14:30   Ct Cervical Spine Wo Contrast  Result Date: 06/23/2016 CLINICAL DATA:  Fall hitting both front and back of her head. Persistent nausea, vomiting, and diarrhea. New onset headache. EXAM: CT HEAD WITHOUT CONTRAST CT CERVICAL SPINE WITHOUT CONTRAST TECHNIQUE: Multidetector CT imaging of the head and cervical spine was performed following the standard protocol without intravenous contrast. Multiplanar CT image reconstructions of the cervical spine were also generated. COMPARISON:  CT of the head 06/16/2016.  CT of the head and cervical spine 06/05/2016 FINDINGS: CT HEAD FINDINGS Mild generalized atrophy and white matter disease is stable. No acute cortical infarct, hemorrhage, or mass lesion is present. Basal ganglia are intact. Insular ribbon is normal. A remote right cerebellar infarct is stable. The brainstem and cerebellum are otherwise unremarkable. No acute hemorrhage is present. No significant extracranial soft tissue trauma is evident. The calvarium is intact. 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 Atherosclerotic calcifications are present within the cavernous internal carotid arteries. Bilateral lens replacements are present. The globes and orbits are otherwise intact. CT CERVICAL SPINE FINDINGS Cervical spine is imaged from the skullbase through the T2-3 level. Vertebral body heights and alignment are maintained. No acute fracture traumatic subluxation is present. Straightening of the normal cervical lordosis is stable. Atherosclerotic calcifications are noted at the carotid bifurcations bilaterally. The carotid bifurcations are medial at the level of C2-3. The soft tissues of the neck are otherwise unremarkable. The lung apices are clear. Atherosclerotic calcifications are present at the aortic arch. IMPRESSION: 1. No acute intracranial abnormality or significant interval change. 2. No significant extracranial trauma. 3. Stable atrophy and white matter disease. 4. Multilevel spondylosis of the cervical spine without significant change. No acute fracture traumatic subluxation. 5. Atherosclerosis evident at the aortic arch and carotid bifurcations bilaterally. Electronically Signed   By: San Morelle M.D.   On: 06/23/2016 17:03   Ct Abdomen Pelvis W Contrast  Result Date: 06/23/2016 CLINICAL DATA:  Nausea, vomiting, diarrhea, and weakness. EXAM: CT ABDOMEN AND PELVIS WITH CONTRAST TECHNIQUE: Multidetector CT imaging of the abdomen and pelvis was performed using the standard protocol  following bolus administration of intravenous contrast. CONTRAST:  1104mL ISOVUE-300 IOPAMIDOL (ISOVUE-300) INJECTION 61% COMPARISON:  04/15/2016 FINDINGS: Mild scarring and subpleural reticulation at the lung bases are similar to the prior study. No pleural effusion. Diffusely decreased attenuation of the liver is consistent with steatosis. Tiny layering stones are present in the gallbladder. There is no biliary dilatation. The spleen, adrenal glands, and pancreas are unremarkable. Small bilateral nonobstructive renal calculi are unchanged. An 11 mm cyst in the anterior interpolar left kidney is unchanged. A subcentimeter hypodensity in the upper pole the left kidney is too small to characterize. There is a small sliding hiatal hernia. A small lipoma in the duodenum is unchanged. Oral contrast is present in nondilated loops of proximal small bowel without evidence of bowel obstruction. The appendix is unremarkable. Diffuse aortoiliac atherosclerosis is noted. No free fluid or enlarged lymph nodes are identified. The bladder is unremarkable. The uterus and ovaries are unremarkable. Subcutaneous injection sites in the anterior abdominal wall. Prior right total hip arthroplasty. Moderate lower lumbar facet arthrosis with slight anterolisthesis of L3 on L4  and L4 on L5 and slight retrolisthesis of L5 on S1, unchanged. IMPRESSION: 1. No acute abnormality identified in the abdomen or pelvis. 2. Nonobstructing nephrolithiasis bilaterally. 3. Aortic atherosclerosis. Electronically Signed   By: Logan Bores M.D.   On: 06/23/2016 19:59    Assessment/Plan:   1. Diarrhea of infectious origin  Mrs. Vink was admitted with diarrhea d/t C-Diff. Received PT/OT for decreased strength and weakness. Improvement was noted. Completed ABT tx for C-Diff with no adverse reactions noted. Discharging home to [facility] being transported by family.    Patient is being discharged with the following home health services:   HHPT/OT/SW  Patient is being discharged with the following durable medical equipment:  none  Patient has been advised to f/u with their PCP in 1-2 weeks to bring them up to date on their rehab stay.  Social services at facility was responsible for arranging this appointment.  Pt was provided with a 30 day supply of prescriptions for medications and refills must be obtained from their PCP.  For controlled substances, a more limited supply may be provided adequate until PCP appointment only.  Future labs/tests needed:  none  Family/ staff Communication:   Total Time: 15 minutes  Documentation: 10 minutes  Face to Face: 5 minutes  Family/Phone: family present at [place], NP-C Geriatrics [facility] [address], [place] [address] Cell Phone (Mon-Fri 8am-5pm):  [phone] On Call:  [phone] & follow prompts after 5pm & weekends Office Phone:  [phone] Office Fax:  [phone]

## 2016-07-28 ENCOUNTER — Encounter: Payer: Self-pay | Admitting: *Deleted

## 2016-07-28 ENCOUNTER — Ambulatory Visit: Payer: Medicare Other | Admitting: Cardiovascular Disease

## 2016-08-29 ENCOUNTER — Encounter: Payer: Medicare Other | Admitting: *Deleted

## 2016-08-29 ENCOUNTER — Telehealth: Payer: Self-pay | Admitting: Cardiology

## 2016-08-29 NOTE — Telephone Encounter (Signed)
LMOVM reminding pt to send remote transmission.   

## 2016-09-01 ENCOUNTER — Encounter: Payer: Self-pay | Admitting: Cardiology

## 2016-09-15 ENCOUNTER — Emergency Department
Admission: EM | Admit: 2016-09-15 | Discharge: 2016-09-15 | Disposition: A | Discharge status: Home / Self Care | Payer: Medicare Other | Attending: Emergency Medicine | Admitting: Emergency Medicine

## 2016-09-15 DIAGNOSIS — R112 Nausea with vomiting, unspecified: Secondary | ICD-10-CM | POA: Insufficient documentation

## 2016-09-15 DIAGNOSIS — I25729 Atherosclerosis of autologous artery coronary artery bypass graft(s) with unspecified angina pectoris: Secondary | ICD-10-CM | POA: Diagnosis not present

## 2016-09-15 DIAGNOSIS — E119 Type 2 diabetes mellitus without complications: Secondary | ICD-10-CM | POA: Diagnosis not present

## 2016-09-15 DIAGNOSIS — J45909 Unspecified asthma, uncomplicated: Secondary | ICD-10-CM | POA: Insufficient documentation

## 2016-09-15 DIAGNOSIS — I110 Hypertensive heart disease with heart failure: Secondary | ICD-10-CM | POA: Insufficient documentation

## 2016-09-15 DIAGNOSIS — Z79899 Other long term (current) drug therapy: Secondary | ICD-10-CM | POA: Insufficient documentation

## 2016-09-15 DIAGNOSIS — Z7984 Long term (current) use of oral hypoglycemic drugs: Secondary | ICD-10-CM | POA: Diagnosis not present

## 2016-09-15 DIAGNOSIS — Z7982 Long term (current) use of aspirin: Secondary | ICD-10-CM | POA: Diagnosis not present

## 2016-09-15 DIAGNOSIS — I5032 Chronic diastolic (congestive) heart failure: Secondary | ICD-10-CM | POA: Insufficient documentation

## 2016-09-15 LAB — URINALYSIS COMPLETE WITH MICROSCOPIC (ARMC ONLY)
BILIRUBIN URINE: NEGATIVE
Glucose, UA: NEGATIVE mg/dL
Hgb urine dipstick: NEGATIVE
KETONES UR: NEGATIVE mg/dL
Leukocytes, UA: NEGATIVE
Nitrite: NEGATIVE
PROTEIN: 30 mg/dL — AB
Specific Gravity, Urine: 1.024 (ref 1.005–1.030)
pH: 6 (ref 5.0–8.0)

## 2016-09-15 LAB — COMPREHENSIVE METABOLIC PANEL
ALBUMIN: 3.9 g/dL (ref 3.5–5.0)
ALK PHOS: 79 U/L (ref 38–126)
ALT: 20 U/L (ref 14–54)
AST: 36 U/L (ref 15–41)
Anion gap: 11 (ref 5–15)
BUN: 17 mg/dL (ref 6–20)
CALCIUM: 9.1 mg/dL (ref 8.9–10.3)
CO2: 24 mmol/L (ref 22–32)
CREATININE: 0.72 mg/dL (ref 0.44–1.00)
Chloride: 103 mmol/L (ref 101–111)
GFR calc Af Amer: >60 mL/min (ref >60)
GFR calc non Af Amer: >60 mL/min (ref >60)
GLUCOSE: 217 mg/dL — AB (ref 65–99)
Potassium: 3.9 mmol/L (ref 3.5–5.1)
SODIUM: 138 mmol/L (ref 135–145)
Total Bilirubin: 0.6 mg/dL (ref 0.3–1.2)
Total Protein: 7.4 g/dL (ref 6.5–8.1)

## 2016-09-15 LAB — CBC
HCT: 36.5 % (ref 35.0–47.0)
Hemoglobin: 12.2 g/dL (ref 12.0–16.0)
MCH: 28.2 pg (ref 26.0–34.0)
MCHC: 33.3 g/dL (ref 32.0–36.0)
MCV: 84.5 fL (ref 80.0–100.0)
PLATELETS: 276 10*3/uL (ref 150–440)
RBC: 4.32 MIL/uL (ref 3.80–5.20)
RDW: 17.3 % — AB (ref 11.5–14.5)
WBC: 7.4 10*3/uL (ref 3.6–11.0)

## 2016-09-15 LAB — LIPASE, BLOOD: Lipase: 24 U/L (ref 11–51)

## 2016-09-15 MED ORDER — ONDANSETRON HCL 4 MG PO TABS: 4.0000 mg | ORAL_TABLET | Freq: Three times a day (TID) | ORAL | 0 refills | Status: DC | PRN

## 2016-09-15 NOTE — ED Triage Notes (Signed)
Pt came to ED via EMS from home. Pt came home from [facility] 2 days ago for rehab. Pt reports feeling nauseous and vomiting for past 2 days.

## 2016-09-15 NOTE — ED Notes (Signed)
Called EMS to transport pt home.

## 2016-09-15 NOTE — ED Provider Notes (Signed)
[facility] Emergency Department Provider Note    ____________________________________________   I have reviewed the triage vital signs and the nursing notes.   HISTORY  Chief Complaint Abdominal Pain   History limited by: Not Limited   HPI Michele Schultz is a 82 y.o. female who presents to the emergency department today because of concerns for nausea and vomiting. The patient states that she has had this problem on and off for a while. She states that for the past few days it seems to be slightly worse. [facility] ago. She denies any significant abdominal pain. No blood in her vomit. She denies any fevers.   Past Medical History:  Diagnosis Date  . Anemia   . Aortic stenosis, severe    a. s/p Magna Ease pericardial tissue valve size 21 mm replacement in 10/2011 for severe AS 11/12; b. echo 07/2015: EF 55-60% mod concentric LVH, GR1DD, LA mildly dilated, PASP 45 mm Hg  . Cardiac pacemaker -st Judes    11/12  . Chronic diastolic heart failure ([place])    a. echo 2014: EF 55-60%, no RWMA, GR1DD, PASP 47 mm Hg; b. echo 07/2015: EF 55-60% mod concentric LVH, GR1DD, LA mildly dilated, PASP 45 mm Hg  . Complete heart block [facility])    a. s/p St Jude PPM 10/2011 (Ser # [id number]).  . Coronary artery disease    a. s/p 2v CABG 11/12 (VG-LAD, VG-OM1); b. Lexiscan 08/2015: low risk, no ischemia, EF 55-65%.  . Enthesopathy of hip region   . Esophageal reflux    followed by Dr.Seigal. stabilized with a combination of Nexium and Zantac  . Fibromyalgia   . HLD (hyperlipidemia)   . Hypertensive heart disease   . Knee joint replacement by other means   . Morbid obesity ([place])   . Neuralgia, neuritis, and radiculitis, unspecified   . Neuropathy ([place])   . Onychia and paronychia of toe   . Osteoarthrosis, unspecified whether generalized or localized, pelvic region and thigh    mainly in her back and knees  . PAF (paroxysmal atrial  fibrillation) ([place])    a. brief episodes of AF previously noted on device interrogations.  Marland Kitchen RAD (reactive airway disease)    a. chronic SOB  . Spinal stenosis   . Stress incontinence, female    followed by Dr.Cope  . Type II or unspecified type diabetes mellitus without mention of complication, not stated as uncontrolled    a. pt. reports that she is borderline   . Wide-complex tachycardia ([place])    a. Noted 4/10 - brief episode in ED. Noted again 4/21 in clinic->felt most likely to be atrial tach.    Patient Active Problem List   Diagnosis Date Noted  . Headache due to trauma 07/03/2016  . Hypertensive heart disease   . Wide-complex tachycardia ([place])   . Pain in the chest   . Emesis   . Weakness of both legs   . Coronary artery disease involving coronary bypass graft of native heart with unspecified angina pectoris   . S/P AVR (aortic valve replacement)   . Falls frequently   . HLD (hyperlipidemia)   . Fibromyalgia   . RAD (reactive airway disease)   . Morbid obesity ([place])   . Aortic stenosis, severe   . Chronic pain 06/17/2014  . Pain in the muscles 06/19/2013  . Chest pain 06/19/2013  . Wheezing 06/19/2013  . Generalized weakness 06/03/2013  . Dyspnea 01/27/2013  .  Nausea 03/05/2012  . Constipation 03/05/2012  . Atrial fibrillation-non sustained 02/09/2012  . Cardiac pacemaker -st Judes   . Chronic diastolic heart failure ([place])   . Coronary artery disease   . Long term (current) use of anticoagulants 01/03/2012  . S/P CABG x 2 11/14/2011  . S/P aortic valve replacement with porcine valve 11/14/2011  . Back pain 11/14/2011  . Complete heart block ([place]) 10/20/2011  . HTN (hypertension) 09/15/2011  . Spinal stenosis 03/22/2011  . Fatigue 03/22/2011  . Hyperlipidemia 03/22/2011    Past Surgical History:  Procedure Laterality Date  . AORTIC VALVE REPLACEMENT  10/17/2011   Procedure: AORTIC VALVE REPLACEMENT (AVR);  Surgeon: Melrose Nakayama, MD;  Location: [place];  Service: Open Heart Surgery;  Laterality: N/A;  . CARDIAC CATHETERIZATION  08/2009   50% stenosis distal left main, 50% stenosis ostial left circumflex.   Marland Kitchen CARDIAC CATHETERIZATION     at [facility]  . CORONARY ARTERY BYPASS GRAFT  10/17/2011   Procedure: CORONARY ARTERY BYPASS GRAFTING (CABG);  Surgeon: Melrose Nakayama, MD;  Location: [place];  Service: Open Heart Surgery;  Laterality: N/A;  Times Two, using right leg greater saphenous vein harvested endoscopically  . EYE SURGERY     IOL/ after cataracts removed- bilateral   . NASAL SINUS SURGERY  2009  . PERMANENT PACEMAKER INSERTION N/A 10/20/2011   Procedure: PERMANENT PACEMAKER INSERTION;  Surgeon: Thompson Grayer, MD;  Location: [facility] CATH LAB;  Service: Cardiovascular;  Laterality: N/A;  . REPLACEMENT TOTAL KNEE  11/2006   right knee  . TOTAL HIP ARTHROPLASTY  09/2010    Prior to Admission medications   Medication Sig Start Date End Date Taking? Authorizing Provider  acidophilus (RISAQUAD) CAPS capsule Take 1 capsule by mouth 2 (two) times daily. 06/26/16   Bettey Costa, MD  albuterol-ipratropium (COMBIVENT) 18-103 MCG/ACT inhaler Inhale 2 puffs into the lungs every 6 (six) hours as needed for wheezing. 06/19/13   Minna Merritts, MD  amLODipine (NORVASC) 5 MG tablet Take 1 tablet (5 mg total) by mouth daily. 11/01/15   Lauree Chandler, NP  aspirin EC 81 MG tablet Take 81 mg by mouth daily.    Historical Provider, MD  azelastine (ASTELIN) 0.1 % nasal spray Place 1 spray into both nostrils as needed for rhinitis.     Historical Provider, MD  benzonatate (TESSALON) 100 MG capsule Take 200 mg by mouth 3 (three) times daily as needed for cough.     Historical Provider, MD  cyanocobalamin (,VITAMIN B-12,) 1000 MCG/ML injection Inject 1,000 mcg into the muscle every 30 (thirty) days.    Historical Provider, MD  cycloSPORINE (RESTASIS) 0.05 % ophthalmic emulsion Place 1 drop into both eyes 2 (two) times daily.     Historical Provider, MD   furosemide (LASIX) 20 MG tablet Take 1 tablet (20 mg total) by mouth every other day. 04/06/16   Rogelia Mire, NP  gabapentin (NEURONTIN) 300 MG capsule Take 1 capsule (300 mg total) by mouth 2 (two) times daily. 11/01/15   Lauree Chandler, NP  HYDROcodone-acetaminophen (NORCO) 10-325 MG tablet Take 1 tablet by mouth every 4 (four) hours as needed for moderate pain. Reported on 05/30/2016 06/26/16   Bettey Costa, MD  losartan (COZAAR) 25 MG tablet Take 25 mg by mouth daily.    Historical Provider, MD  magnesium hydroxide (MILK OF MAGNESIA) 400 MG/5ML suspension Take 15 mLs by mouth daily as needed for mild constipation.     Historical Provider, MD  metoCLOPramide (REGLAN) 10 MG tablet Take 1 tablet (10 mg total) by mouth every 8 (eight) hours as needed for nausea or vomiting. 04/23/16   Earleen Newport, MD  metoprolol succinate (TOPROL-XL) 50 MG 24 hr tablet Take 1 tablet (50 mg total) by mouth daily. 11/01/15   Lauree Chandler, NP  nitroGLYCERIN (NITROSTAT) 0.4 MG SL tablet Place 1 tablet (0.4 mg total) under the tongue every 5 (five) minutes as needed for chest pain. 01/24/13   Minna Merritts, MD  pantoprazole (PROTONIX) 40 MG tablet Take 40 mg by mouth 2 (two) times daily.      Historical Provider, MD  potassium chloride (K-DUR) 10 MEQ tablet Take 10 mEq by mouth daily.    Historical Provider, MD  promethazine (PHENERGAN) 25 MG tablet Take 1 tablet (25 mg total) by mouth every 6 (six) hours as needed for nausea or vomiting. 06/17/14   Minna Merritts, MD  traMADol (ULTRAM) 50 MG tablet Take 1 tablet (50 mg total) by mouth every 6 (six) hours as needed. 04/23/16 04/23/17  Earleen Newport, MD    Allergies Citalopram; Cymbalta [duloxetine hcl]; Imipramine; Proton pump inhibitors; and Venlafaxine  Family History  Problem Relation Age of Onset  . Diabetes Other   . Heart disease Sister   . Heart disease Brother   . Heart disease Brother   . Heart disease Brother   . Anesthesia  problems Neg Hx   . Hypotension Neg Hx   . Malignant hyperthermia Neg Hx   . Pseudochol deficiency Neg Hx     Social History Social History  Substance Use Topics  . Smoking status: Never Smoker  . Smokeless tobacco: Never Used  . Alcohol use No    Review of Systems  Constitutional: Negative for fever. Cardiovascular: Negative for chest pain. Respiratory: Negative for shortness of breath. Gastrointestinal: Positive for nausea and vomiting. Genitourinary: Negative for dysuria. Musculoskeletal: Negative for back pain. Skin: Negative for rash. Neurological: Negative for headaches, focal weakness or numbness.  10-point ROS otherwise negative.  ____________________________________________   PHYSICAL EXAM:  VITAL SIGNS: ED Triage Vitals  Enc Vitals Group     BP 129/85     Pulse 94     Resp 16     Temp 98.2     Temp src      SpO2 100     Weight      Height      Head Circumference      Peak Flow      Pain Score      Pain Loc      Pain Edu?      Excl. in [place]?      Constitutional: Alert and oriented. Well appearing and in no distress. Eyes: Conjunctivae are normal. Normal extraocular movements. ENT   Head: Normocephalic and atraumatic.   Nose: No congestion/rhinnorhea.   Mouth/Throat: Mucous membranes are moist.   Neck: No stridor. Hematological/Lymphatic/Immunilogical: No cervical lymphadenopathy. Cardiovascular: Normal rate, regular rhythm.  No murmurs, rubs, or gallops. Respiratory: Normal respiratory effort without tachypnea nor retractions. Breath sounds are clear and equal bilaterally. No wheezes/rales/rhonchi. Gastrointestinal: Soft and nontender. No distention.  Genitourinary: Deferred Musculoskeletal: Normal range of motion in all extremities. No lower extremity edema. Neurologic:  Normal speech and language. No gross focal neurologic deficits are appreciated.  Skin:  Skin is warm, dry and intact. No rash noted. Psychiatric: Mood and affect  are normal. Speech and behavior are normal. Patient exhibits appropriate insight and judgment.  ____________________________________________  LABS (pertinent positives/negatives)  Labs Reviewed  COMPREHENSIVE METABOLIC PANEL - Abnormal; Notable for the following:       Result Value   Glucose, Bld 217 (*)    All other components within normal limits  CBC - Abnormal; Notable for the following:    RDW 17.3 (*)    All other components within normal limits  URINALYSIS COMPLETEWITH MICROSCOPIC ([facility] ONLY) - Abnormal; Notable for the following:    Color, Urine YELLOW (*)    APPearance CLEAR (*)    Protein, ur 30 (*)    Bacteria, UA RARE (*)    Squamous Epithelial / LPF 0-5 (*)    All other components within normal limits  LIPASE, BLOOD    ____________________________________________   EKG  None  ____________________________________________    RADIOLOGY  None  ____________________________________________   PROCEDURES  Procedures  ____________________________________________   INITIAL IMPRESSION / ASSESSMENT AND PLAN / ED COURSE  Pertinent labs & imaging results that were available during my care of the patient were reviewed by me and considered in my medical decision making (see chart for details).  Patient presented to the emergency department today with concerns for some nausea and vomiting. This does appear to be a somewhat chronic issue. Blood work today without any concerning findings. Feel patient is safe for discharge home. ____________________________________________   FINAL CLINICAL IMPRESSION(S) / ED DIAGNOSES  Final diagnoses:  Nausea and vomiting, intractability of vomiting not specified, unspecified vomiting type     Note: This dictation was prepared with Dragon dictation. Any transcriptional errors that result from this process are unintentional    Nance Pear, MD 09/15/16 1408

## 2016-09-15 NOTE — ED Notes (Signed)
Pt asking for food. Given snacks and juice, called dietary for meal trays.

## 2016-09-15 NOTE — ED Notes (Signed)
Case manager at bedside talking to pt about options for help at home.

## 2016-09-15 NOTE — ED Notes (Signed)
Pt given snack and drink 

## 2016-09-15 NOTE — Care Management Note (Signed)
Case Management Note  Patient Details  Name: Michele Schultz MRN: [id number] Date of Birth: [birth year]  Subjective/Objective:   Spoke to patient at bedside with RN present. Patient has asked for home resources to help her with adl's and getting around the home daily.  I have clarified that she does not want home PT or nursing which has been offered. She wants someone to stay with her around the clock.  I have explained to her that Medicare does not cover the services she wants. I have provided her with a personal care agency list. She says she has no money , no friends and no relatives to assist her. I have  Found out from the patient that it has been several months since she last saw her PCP. I have suggested to her she get an appointment , and get re-connected with the PCP of her choice. She has asked for some lunch and she will get a tray before being discharged.  The MD has agreed with the plan for her to discharge home.There is no acute reason for her to be admitted.                Action/Plan:   Expected Discharge Date:                  Expected Discharge Plan:     In-House Referral:     Discharge planning Services     Post Acute Care Choice:    Choice offered to:     DME Arranged:    DME Agency:     HH Arranged:    HH Agency:     Status of Service:     If discussed at [employer] of Stay Meetings, dates discussed:    Additional Comments:  Beau Fanny, RN 09/15/2016, 2:16 PM

## 2016-09-15 NOTE — ED Notes (Signed)
Pt reports she lost her black bag with personal papers in them. Pt would not tell RN what papers she was missing because "it is none of your business. Will check with EMS.

## 2016-09-15 NOTE — Discharge Instructions (Signed)
Please seek medical attention for any high fevers, chest pain, shortness of breath, change in behavior, persistent vomiting, bloody stool or any other new or concerning symptoms.  

## 2016-09-17 ENCOUNTER — Emergency Department: Payer: Medicare Other

## 2016-09-17 ENCOUNTER — Observation Stay: Payer: Medicare Other

## 2016-09-17 ENCOUNTER — Observation Stay
Admission: EM | Admit: 2016-09-17 | Discharge: 2016-09-22 | Disposition: A | Discharge status: Skilled Nursing Facility | Payer: Medicare Other | Attending: Internal Medicine | Admitting: Internal Medicine

## 2016-09-17 DIAGNOSIS — M797 Fibromyalgia: Secondary | ICD-10-CM | POA: Diagnosis not present

## 2016-09-17 DIAGNOSIS — R740 Nonspecific elevation of levels of transaminase and lactic acid dehydrogenase [LDH]: Secondary | ICD-10-CM | POA: Insufficient documentation

## 2016-09-17 DIAGNOSIS — R262 Difficulty in walking, not elsewhere classified: Secondary | ICD-10-CM

## 2016-09-17 DIAGNOSIS — E872 Acidosis, unspecified: Secondary | ICD-10-CM

## 2016-09-17 DIAGNOSIS — I110 Hypertensive heart disease with heart failure: Secondary | ICD-10-CM | POA: Insufficient documentation

## 2016-09-17 DIAGNOSIS — E860 Dehydration: Secondary | ICD-10-CM | POA: Diagnosis not present

## 2016-09-17 DIAGNOSIS — M1610 Unilateral primary osteoarthritis, unspecified hip: Secondary | ICD-10-CM | POA: Insufficient documentation

## 2016-09-17 DIAGNOSIS — K219 Gastro-esophageal reflux disease without esophagitis: Secondary | ICD-10-CM | POA: Insufficient documentation

## 2016-09-17 DIAGNOSIS — R296 Repeated falls: Secondary | ICD-10-CM | POA: Diagnosis not present

## 2016-09-17 DIAGNOSIS — R609 Edema, unspecified: Secondary | ICD-10-CM | POA: Diagnosis not present

## 2016-09-17 DIAGNOSIS — N393 Stress incontinence (female) (male): Secondary | ICD-10-CM | POA: Insufficient documentation

## 2016-09-17 DIAGNOSIS — I442 Atrioventricular block, complete: Secondary | ICD-10-CM | POA: Diagnosis not present

## 2016-09-17 DIAGNOSIS — I2510 Atherosclerotic heart disease of native coronary artery without angina pectoris: Secondary | ICD-10-CM | POA: Diagnosis not present

## 2016-09-17 DIAGNOSIS — E785 Hyperlipidemia, unspecified: Secondary | ICD-10-CM | POA: Diagnosis not present

## 2016-09-17 DIAGNOSIS — Z515 Encounter for palliative care: Secondary | ICD-10-CM

## 2016-09-17 DIAGNOSIS — I350 Nonrheumatic aortic (valve) stenosis: Secondary | ICD-10-CM | POA: Diagnosis not present

## 2016-09-17 DIAGNOSIS — R2681 Unsteadiness on feet: Secondary | ICD-10-CM

## 2016-09-17 DIAGNOSIS — M6281 Muscle weakness (generalized): Secondary | ICD-10-CM

## 2016-09-17 DIAGNOSIS — Z66 Do not resuscitate: Secondary | ICD-10-CM

## 2016-09-17 DIAGNOSIS — Z96651 Presence of right artificial knee joint: Secondary | ICD-10-CM | POA: Insufficient documentation

## 2016-09-17 DIAGNOSIS — Z951 Presence of aortocoronary bypass graft: Secondary | ICD-10-CM | POA: Insufficient documentation

## 2016-09-17 DIAGNOSIS — R160 Hepatomegaly, not elsewhere classified: Secondary | ICD-10-CM | POA: Diagnosis not present

## 2016-09-17 DIAGNOSIS — Z888 Allergy status to other drugs, medicaments and biological substances status: Secondary | ICD-10-CM | POA: Insufficient documentation

## 2016-09-17 DIAGNOSIS — I471 Supraventricular tachycardia: Secondary | ICD-10-CM | POA: Diagnosis not present

## 2016-09-17 DIAGNOSIS — R000 Tachycardia, unspecified: Secondary | ICD-10-CM

## 2016-09-17 DIAGNOSIS — I5032 Chronic diastolic (congestive) heart failure: Secondary | ICD-10-CM | POA: Diagnosis not present

## 2016-09-17 DIAGNOSIS — M7989 Other specified soft tissue disorders: Secondary | ICD-10-CM | POA: Insufficient documentation

## 2016-09-17 DIAGNOSIS — K760 Fatty (change of) liver, not elsewhere classified: Secondary | ICD-10-CM | POA: Diagnosis not present

## 2016-09-17 DIAGNOSIS — R7989 Other specified abnormal findings of blood chemistry: Secondary | ICD-10-CM

## 2016-09-17 DIAGNOSIS — N200 Calculus of kidney: Secondary | ICD-10-CM | POA: Diagnosis not present

## 2016-09-17 DIAGNOSIS — J45909 Unspecified asthma, uncomplicated: Secondary | ICD-10-CM | POA: Insufficient documentation

## 2016-09-17 DIAGNOSIS — Z6841 Body Mass Index (BMI) 40.0 and over, adult: Secondary | ICD-10-CM | POA: Insufficient documentation

## 2016-09-17 DIAGNOSIS — E119 Type 2 diabetes mellitus without complications: Secondary | ICD-10-CM | POA: Insufficient documentation

## 2016-09-17 DIAGNOSIS — Z950 Presence of cardiac pacemaker: Secondary | ICD-10-CM | POA: Insufficient documentation

## 2016-09-17 DIAGNOSIS — R197 Diarrhea, unspecified: Secondary | ICD-10-CM

## 2016-09-17 DIAGNOSIS — G629 Polyneuropathy, unspecified: Secondary | ICD-10-CM | POA: Insufficient documentation

## 2016-09-17 DIAGNOSIS — I480 Paroxysmal atrial fibrillation: Secondary | ICD-10-CM | POA: Diagnosis not present

## 2016-09-17 DIAGNOSIS — Z7982 Long term (current) use of aspirin: Secondary | ICD-10-CM | POA: Insufficient documentation

## 2016-09-17 DIAGNOSIS — Z9889 Other specified postprocedural states: Secondary | ICD-10-CM | POA: Insufficient documentation

## 2016-09-17 DIAGNOSIS — I447 Left bundle-branch block, unspecified: Secondary | ICD-10-CM | POA: Diagnosis not present

## 2016-09-17 DIAGNOSIS — K529 Noninfective gastroenteritis and colitis, unspecified: Secondary | ICD-10-CM

## 2016-09-17 HISTORY — DX: Other supraventricular tachycardia: I47.19

## 2016-09-17 HISTORY — DX: Supraventricular tachycardia: I47.1

## 2016-09-17 LAB — GASTROINTESTINAL PANEL BY PCR, STOOL (REPLACES STOOL CULTURE)

## 2016-09-17 LAB — LACTIC ACID, PLASMA
LACTIC ACID, VENOUS: 2 mmol/L — AB (ref 0.5–1.9)
Lactic Acid, Venous: 3.6 mmol/L (ref 0.5–1.9)

## 2016-09-17 LAB — C DIFFICILE QUICK SCREEN W PCR REFLEX
C DIFFICILE (CDIFF) INTERP: NOT DETECTED
C DIFFICLE (CDIFF) ANTIGEN: NEGATIVE
C Diff toxin: NEGATIVE

## 2016-09-17 LAB — CBC WITH DIFFERENTIAL/PLATELET
BASOS ABS: 0 10*3/uL (ref 0–0.1)
BASOS PCT: 1 %
EOS ABS: 0.1 10*3/uL (ref 0–0.7)
Eosinophils Relative: 1 %
HCT: 36.3 % (ref 35.0–47.0)
HEMOGLOBIN: 12 g/dL (ref 12.0–16.0)
Lymphocytes Relative: 26 %
Lymphs Abs: 1.9 10*3/uL (ref 1.0–3.6)
MCH: 27.7 pg (ref 26.0–34.0)
MCHC: 33.2 g/dL (ref 32.0–36.0)
MCV: 83.5 fL (ref 80.0–100.0)
MONOS PCT: 8 %
Monocytes Absolute: 0.6 10*3/uL (ref 0.2–0.9)
NEUTROS PCT: 64 %
Neutro Abs: 4.8 10*3/uL (ref 1.4–6.5)
Platelets: 266 10*3/uL (ref 150–440)
RBC: 4.35 MIL/uL (ref 3.80–5.20)
RDW: 17 % — ABNORMAL HIGH (ref 11.5–14.5)
WBC: 7.4 10*3/uL (ref 3.6–11.0)

## 2016-09-17 LAB — COMPREHENSIVE METABOLIC PANEL
ALBUMIN: 3.6 g/dL (ref 3.5–5.0)
ALK PHOS: 77 U/L (ref 38–126)
ALT: 20 U/L (ref 14–54)
ANION GAP: 10 (ref 5–15)
AST: 35 U/L (ref 15–41)
BUN: 11 mg/dL (ref 6–20)
CALCIUM: 9.1 mg/dL (ref 8.9–10.3)
CO2: 26 mmol/L (ref 22–32)
Chloride: 102 mmol/L (ref 101–111)
Creatinine, Ser: 0.75 mg/dL (ref 0.44–1.00)
GFR calc Af Amer: >60 mL/min (ref >60)
GFR calc non Af Amer: >60 mL/min (ref >60)
GLUCOSE: 208 mg/dL — AB (ref 65–99)
Potassium: 3.5 mmol/L (ref 3.5–5.1)
SODIUM: 138 mmol/L (ref 135–145)
Total Bilirubin: 0.7 mg/dL (ref 0.3–1.2)
Total Protein: 7.2 g/dL (ref 6.5–8.1)

## 2016-09-17 LAB — LIPASE, BLOOD: Lipase: 26 U/L (ref 11–51)

## 2016-09-17 LAB — CK: CK TOTAL: 46 U/L (ref 38–234)

## 2016-09-17 LAB — MAGNESIUM: Magnesium: 2 mg/dL (ref 1.7–2.4)

## 2016-09-17 LAB — TROPONIN I

## 2016-09-17 LAB — GLUCOSE, CAPILLARY
GLUCOSE-CAPILLARY: 161 mg/dL — AB (ref 65–99)
Glucose-Capillary: 119 mg/dL — ABNORMAL HIGH (ref 65–99)

## 2016-09-17 MED ORDER — VITAMIN D 1000 UNITS PO TABS
1000.0000 [IU] | ORAL_TABLET | Freq: Every day | ORAL | Status: DC
  Administered 2016-09-17 – 2016-09-22 (×5): 1000 [IU] via ORAL
  Filled 2016-09-17 (×9): qty 1

## 2016-09-17 MED ORDER — CALCIUM CARBONATE-VITAMIN D 500-200 MG-UNIT PO TABS
1.0000 | ORAL_TABLET | Freq: Every day | ORAL | Status: DC
  Administered 2016-09-17 – 2016-09-22 (×5): 1 via ORAL
  Filled 2016-09-17 (×6): qty 1

## 2016-09-17 MED ORDER — ASPIRIN EC 81 MG PO TBEC
81.0000 mg | DELAYED_RELEASE_TABLET | Freq: Every day | ORAL | Status: DC
  Administered 2016-09-17 – 2016-09-22 (×6): 81 mg via ORAL
  Filled 2016-09-17 (×6): qty 1

## 2016-09-17 MED ORDER — HYDROCODONE-ACETAMINOPHEN 10-325 MG PO TABS
1.0000 | ORAL_TABLET | ORAL | Status: DC | PRN
  Administered 2016-09-17 – 2016-09-18 (×3): 1 via ORAL
  Filled 2016-09-17 (×3): qty 1

## 2016-09-17 MED ORDER — ONDANSETRON HCL 4 MG/2ML IJ SOLN
4.0000 mg | Freq: Four times a day (QID) | INTRAMUSCULAR | Status: DC | PRN
  Administered 2016-09-19 – 2016-09-20 (×3): 4 mg via INTRAVENOUS
  Filled 2016-09-17 (×3): qty 2

## 2016-09-17 MED ORDER — BUDESONIDE 0.25 MG/2ML IN SUSP
0.2500 mg | Freq: Two times a day (BID) | RESPIRATORY_TRACT | Status: DC
  Administered 2016-09-17 – 2016-09-22 (×10): 0.25 mg via RESPIRATORY_TRACT
  Filled 2016-09-17 (×10): qty 2

## 2016-09-17 MED ORDER — IPRATROPIUM-ALBUTEROL 0.5-2.5 (3) MG/3ML IN SOLN
3.0000 mL | RESPIRATORY_TRACT | Status: DC | PRN
  Filled 2016-09-17: qty 3

## 2016-09-17 MED ORDER — ONDANSETRON 4 MG PO TBDP
4.0000 mg | ORAL_TABLET | Freq: Four times a day (QID) | ORAL | Status: DC | PRN
  Administered 2016-09-18: 4 mg via ORAL
  Filled 2016-09-17: qty 1

## 2016-09-17 MED ORDER — PANTOPRAZOLE SODIUM 40 MG PO TBEC
40.0000 mg | DELAYED_RELEASE_TABLET | Freq: Two times a day (BID) | ORAL | Status: DC
  Administered 2016-09-17 – 2016-09-22 (×9): 40 mg via ORAL
  Filled 2016-09-17 (×10): qty 1

## 2016-09-17 MED ORDER — ENOXAPARIN SODIUM 40 MG/0.4ML ~~LOC~~ SOLN
40.0000 mg | Freq: Two times a day (BID) | SUBCUTANEOUS | Status: DC
  Administered 2016-09-17 – 2016-09-22 (×10): 40 mg via SUBCUTANEOUS
  Filled 2016-09-17 (×10): qty 0.4

## 2016-09-17 MED ORDER — LIDOCAINE 5 % EX PTCH
1.0000 | MEDICATED_PATCH | Freq: Two times a day (BID) | CUTANEOUS | Status: DC
  Administered 2016-09-18 – 2016-09-19 (×2): 1 via TRANSDERMAL
  Filled 2016-09-17 (×11): qty 1

## 2016-09-17 MED ORDER — GABAPENTIN 300 MG PO CAPS
300.0000 mg | ORAL_CAPSULE | Freq: Two times a day (BID) | ORAL | Status: DC
  Administered 2016-09-17 – 2016-09-22 (×10): 300 mg via ORAL
  Filled 2016-09-17 (×10): qty 1

## 2016-09-17 MED ORDER — CYANOCOBALAMIN 1000 MCG/ML IJ SOLN
1000.0000 ug | INTRAMUSCULAR | Status: DC
  Administered 2016-09-18: 1000 ug via INTRAMUSCULAR
  Filled 2016-09-17: qty 1

## 2016-09-17 MED ORDER — TRAMADOL HCL 50 MG PO TABS
50.0000 mg | ORAL_TABLET | Freq: Four times a day (QID) | ORAL | Status: DC | PRN
  Administered 2016-09-20 (×2): 50 mg via ORAL
  Filled 2016-09-17 (×2): qty 1

## 2016-09-17 MED ORDER — SUCRALFATE 1 G PO TABS
1.0000 g | ORAL_TABLET | Freq: Four times a day (QID) | ORAL | Status: DC
  Administered 2016-09-17 – 2016-09-22 (×17): 1 g via ORAL
  Filled 2016-09-17 (×18): qty 1

## 2016-09-17 MED ORDER — ONDANSETRON HCL 4 MG PO TABS: 4.0000 mg | ORAL_TABLET | Freq: Four times a day (QID) | ORAL | Status: DC | PRN

## 2016-09-17 MED ORDER — INSULIN ASPART 100 UNIT/ML ~~LOC~~ SOLN
0.0000 [IU] | Freq: Three times a day (TID) | SUBCUTANEOUS | Status: DC
  Administered 2016-09-18 – 2016-09-21 (×6): 1 [IU] via SUBCUTANEOUS
  Administered 2016-09-21: 2 [IU] via SUBCUTANEOUS
  Administered 2016-09-21: 1 [IU] via SUBCUTANEOUS
  Administered 2016-09-22 (×2): 2 [IU] via SUBCUTANEOUS
  Administered 2016-09-22: 1 [IU] via SUBCUTANEOUS
  Filled 2016-09-17: qty 1
  Filled 2016-09-17: qty 2
  Filled 2016-09-17 (×7): qty 1
  Filled 2016-09-17: qty 2

## 2016-09-17 MED ORDER — INSULIN ASPART 100 UNIT/ML ~~LOC~~ SOLN
3.0000 [IU] | Freq: Three times a day (TID) | SUBCUTANEOUS | Status: DC
  Administered 2016-09-18 – 2016-09-22 (×10): 3 [IU] via SUBCUTANEOUS
  Filled 2016-09-17 (×10): qty 3

## 2016-09-17 MED ORDER — RISAQUAD PO CAPS
1.0000 | ORAL_CAPSULE | Freq: Two times a day (BID) | ORAL | Status: DC
  Administered 2016-09-17 – 2016-09-22 (×9): 1 via ORAL
  Filled 2016-09-17 (×10): qty 1

## 2016-09-17 MED ORDER — IOPAMIDOL (ISOVUE-300) INJECTION 61%
30.0000 mL | Freq: Once | INTRAVENOUS | Status: AC | PRN
  Administered 2016-09-17: 30 mL via ORAL

## 2016-09-17 MED ORDER — ONDANSETRON HCL 4 MG/2ML IJ SOLN
4.0000 mg | Freq: Once | INTRAMUSCULAR | Status: AC
  Administered 2016-09-17: 4 mg via INTRAVENOUS
  Filled 2016-09-17: qty 2

## 2016-09-17 MED ORDER — FAMOTIDINE 20 MG PO TABS
20.0000 mg | ORAL_TABLET | Freq: Two times a day (BID) | ORAL | Status: DC
  Administered 2016-09-17 – 2016-09-22 (×9): 20 mg via ORAL
  Filled 2016-09-17 (×10): qty 1

## 2016-09-17 MED ORDER — IOPAMIDOL (ISOVUE-300) INJECTION 61%
100.0000 mL | Freq: Once | INTRAVENOUS | Status: AC | PRN
  Administered 2016-09-17: 100 mL via INTRAVENOUS

## 2016-09-17 MED ORDER — SODIUM CHLORIDE 0.9 % IV BOLUS (SEPSIS)
1000.0000 mL | Freq: Once | INTRAVENOUS | Status: AC
  Administered 2016-09-17: 1000 mL via INTRAVENOUS

## 2016-09-17 MED ORDER — POTASSIUM CHLORIDE IN NACL 20-0.9 MEQ/L-% IV SOLN
INTRAVENOUS | Status: DC
  Administered 2016-09-17 – 2016-09-18 (×2): via INTRAVENOUS
  Filled 2016-09-17 (×4): qty 1000

## 2016-09-17 MED ORDER — SODIUM CHLORIDE 0.9% FLUSH
3.0000 mL | Freq: Two times a day (BID) | INTRAVENOUS | Status: DC
  Administered 2016-09-17 – 2016-09-21 (×7): 3 mL via INTRAVENOUS

## 2016-09-17 MED ORDER — ACETAMINOPHEN 325 MG PO TABS
650.0000 mg | ORAL_TABLET | ORAL | Status: DC | PRN
  Administered 2016-09-19 – 2016-09-22 (×6): 650 mg via ORAL
  Filled 2016-09-17 (×6): qty 2

## 2016-09-17 MED ORDER — BECLOMETHASONE DIPROPIONATE 80 MCG/ACT IN AERS: 1.0000 | INHALATION_SPRAY | Freq: Two times a day (BID) | RESPIRATORY_TRACT | Status: DC

## 2016-09-17 MED ORDER — INSULIN ASPART 100 UNIT/ML ~~LOC~~ SOLN: 0.0000 [IU] | Freq: Every day | SUBCUTANEOUS | Status: DC

## 2016-09-17 MED ORDER — ATORVASTATIN CALCIUM 20 MG PO TABS
80.0000 mg | ORAL_TABLET | Freq: Every day | ORAL | Status: DC
  Administered 2016-09-17 – 2016-09-22 (×5): 80 mg via ORAL
  Filled 2016-09-17 (×6): qty 4

## 2016-09-17 MED ORDER — METOPROLOL SUCCINATE ER 25 MG PO TB24
25.0000 mg | ORAL_TABLET | Freq: Every day | ORAL | Status: DC
  Administered 2016-09-18: 25 mg via ORAL
  Filled 2016-09-17: qty 1

## 2016-09-17 MED ORDER — NITROGLYCERIN 0.4 MG SL SUBL
0.4000 mg | SUBLINGUAL_TABLET | SUBLINGUAL | Status: DC | PRN
Start: 2016-09-17 — End: 2016-09-22

## 2016-09-17 NOTE — Progress Notes (Signed)
Obtained CBG at 1814. CBG=119. Scanned patient and patient was not in system. Did new patient override.

## 2016-09-17 NOTE — Progress Notes (Signed)
Paula from lab called critical lactic 2.0. Improved from previous in ED.

## 2016-09-17 NOTE — Consult Note (Signed)
Consultation  Referring Provider:      Primary Care Physician:  [name], MD Primary Gastroenterologist:  San Jetty MD       Reason for Consultation:    Nausea, vomiting and regurgitation.     Impression / Plan:   Nausea and Vomiting: This appears to be intermittent and secondary to poor food choices. I would place on an anti-reflux diet and consult a dietician . Observe in hospital and  if has vomiting I would proceed with UGI series.  Diarrhea: Only having 2 BM's day and sometimes takes MOM to have a BM. Complete stool panel negative.CT scan showed no significant findings. My experience is that Metformin 500 mg once day does not cause this but I think it is worth a trial to stop and see if improves. The number of BM's per day while in the hospital will give [place] a better idea. If not improved I would consider an outpatient flexible sigmoidoscopy to exclude microscopic colitis.          HPI:   Michele Schultz is a 82 y.o. female complains mainly of progressive lower extremity weakness and recurrent falls. She also reports intermittent nausea and vomiting. This usually occurs when she eats greasy foods. Currently on Protonix 40 mg bid and Carafate qid.   She reports 2 BM's a day. Stools are soft and mushy. Sometimes watery.  She explained that there are days she does not have any BM's so she takes MOM to prevent constipation. PMHX  significant for C Difficile on 06-2016 by PCR and records reported treated with Vancomycin 125 mg qid for 2 weeks. Stool GI panel and C Difficile was negative today. CT scan showed a fluid filled colon but no inflammatory changes.    Past Medical History:  Diagnosis Date  . Anemia   . Aortic stenosis, severe    a. s/p Magna Ease pericardial tissue valve size 21 mm replacement in 10/2011 for severe AS 11/12; b. echo 07/2015: EF 55-60% mod concentric LVH, GR1DD, LA mildly dilated, PASP 45 mm Hg  . Cardiac pacemaker -st Judes    11/12  . Chronic  diastolic heart failure ([place])    a. echo 2014: EF 55-60%, no RWMA, GR1DD, PASP 47 mm Hg; b. echo 07/2015: EF 55-60% mod concentric LVH, GR1DD, LA mildly dilated, PASP 45 mm Hg  . Complete heart block [facility])    a. s/p St Jude PPM 10/2011 (Ser # [id number]).  . Coronary artery disease    a. s/p 2v CABG 11/12 (VG-LAD, VG-OM1); b. Lexiscan 08/2015: low risk, no ischemia, EF 55-65%.  . Enthesopathy of hip region   . Esophageal reflux    followed by Dr.Seigal. stabilized with a combination of Nexium and Zantac  . Fibromyalgia   . HLD (hyperlipidemia)   . Hypertensive heart disease   . Knee joint replacement by other means   . Morbid obesity ([place])   . Neuralgia, neuritis, and radiculitis, unspecified   . Neuropathy ([place])   . Onychia and paronychia of toe   . Osteoarthrosis, unspecified whether generalized or localized, pelvic region and thigh    mainly in her back and knees  . PAF (paroxysmal atrial fibrillation) ([place])    a. brief episodes of AF previously noted on device interrogations.  Marland Kitchen RAD (reactive airway disease)    a. chronic SOB  . Spinal stenosis   . Stress incontinence, female    followed by Dr.Cope  . Type II or unspecified type diabetes mellitus without mention  of complication, not stated as uncontrolled    a. pt. reports that she is borderline   . Wide-complex tachycardia ([place])    a. Noted 4/10 - brief episode in ED. Noted again 4/21 in clinic->felt most likely to be atrial tach.    Past Surgical History:  Procedure Laterality Date  . AORTIC VALVE REPLACEMENT  10/17/2011   Procedure: AORTIC VALVE REPLACEMENT (AVR);  Surgeon: Melrose Nakayama, MD;  Location: [place];  Service: Open Heart Surgery;  Laterality: N/A;  . CARDIAC CATHETERIZATION  08/2009   50% stenosis distal left main, 50% stenosis ostial left circumflex.   Marland Kitchen CARDIAC CATHETERIZATION     at [facility]  . CORONARY ARTERY BYPASS GRAFT  10/17/2011   Procedure: CORONARY ARTERY BYPASS GRAFTING (CABG);  Surgeon: Melrose Nakayama, MD;  Location: [place];  Service: Open Heart Surgery;  Laterality: N/A;  Times Two, using right leg greater saphenous vein harvested endoscopically  . EYE SURGERY     IOL/ after cataracts removed- bilateral   . NASAL SINUS SURGERY  2009  . PERMANENT PACEMAKER INSERTION N/A 10/20/2011   Procedure: PERMANENT PACEMAKER INSERTION;  Surgeon: Thompson Grayer, MD;  Location: [facility] CATH LAB;  Service: Cardiovascular;  Laterality: N/A;  . REPLACEMENT TOTAL KNEE  11/2006   right knee  . TOTAL HIP ARTHROPLASTY  09/2010    Family History  Problem Relation Age of Onset  . Heart disease Sister   . Heart disease Brother   . Heart disease Brother   . Heart disease Brother   . Diabetes Other   . Anesthesia problems Neg Hx   . Hypotension Neg Hx   . Malignant hyperthermia Neg Hx   . Pseudochol deficiency Neg Hx       Social History  Substance Use Topics  . Smoking status: Never Smoker  . Smokeless tobacco: Never Used  . Alcohol use No    Prior to Admission medications   Medication Sig Start Date End Date Taking? Authorizing Provider  acetaminophen (TYLENOL) 325 MG tablet Take 650 mg by mouth 3 (three) times daily as needed.   Yes Historical Provider, MD  acidophilus (RISAQUAD) CAPS capsule Take 1 capsule by mouth 2 (two) times daily. 06/26/16  Yes Sital Mody, MD  amLODipine (NORVASC) 5 MG tablet Take 1 tablet (5 mg total) by mouth daily. 11/01/15  Yes Lauree Chandler, NP  aspirin EC 81 MG tablet Take 81 mg by mouth daily.   Yes Historical Provider, MD  atorvastatin (LIPITOR) 80 MG tablet Take 80 mg by mouth daily.   Yes Historical Provider, MD  beclomethasone (QVAR) 80 MCG/ACT inhaler Inhale 1 puff into the lungs 2 (two) times daily.   Yes Historical Provider, MD  Calcium Carbonate-Vitamin D 600-400 MG-UNIT tablet Take 1 tablet by mouth daily.   Yes Historical Provider, MD  Cholecalciferol 1000 units capsule Take 1,000 Units by mouth daily.   Yes Historical Provider, MD  cyanocobalamin  (,VITAMIN B-12,) 1000 MCG/ML injection Inject 1,000 mcg into the muscle every 30 (thirty) days. On the 16th of each month   Yes Historical Provider, MD  furosemide (LASIX) 20 MG tablet Take 1 tablet (20 mg total) by mouth every other day. 04/06/16  Yes Rogelia Mire, NP  gabapentin (NEURONTIN) 300 MG capsule Take 300 mg by mouth 2 (two) times daily.   Yes Historical Provider, MD  HYDROcodone-acetaminophen (NORCO) 10-325 MG tablet Take 1 tablet by mouth every 4 (four) hours as needed for moderate pain. Reported on 05/30/2016 06/26/16  Yes Bettey Costa, MD  ipratropium-albuterol (DUONEB) 0.5-2.5 (3) MG/3ML SOLN Take 3 mLs by nebulization every 6 (six) hours as needed.   Yes Historical Provider, MD  lidocaine (LIDODERM) 5 % Place 1 patch onto the skin every 12 (twelve) hours. Remove & Discard patch within 12 hours or as directed by MD   Yes Historical Provider, MD  losartan (COZAAR) 25 MG tablet Take 25 mg by mouth daily.   Yes Historical Provider, MD  magnesium hydroxide (MILK OF MAGNESIA) 400 MG/5ML suspension Take 10 mLs by mouth daily as needed for mild constipation.    Yes Historical Provider, MD  metFORMIN (GLUCOPHAGE) 500 MG tablet Take 500 mg by mouth daily.   Yes Historical Provider, MD  metoprolol succinate (TOPROL-XL) 25 MG 24 hr tablet Take 25 mg by mouth daily.   Yes Historical Provider, MD  nitroGLYCERIN (NITROSTAT) 0.4 MG SL tablet Place 1 tablet (0.4 mg total) under the tongue every 5 (five) minutes as needed for chest pain. 01/24/13  Yes Minna Merritts, MD  ondansetron (ZOFRAN) 4 MG tablet Take 4 mg by mouth 2 (two) times daily.   Yes Historical Provider, MD  ondansetron (ZOFRAN) 4 MG tablet Take 1 tablet (4 mg total) by mouth every 8 (eight) hours as needed for nausea or vomiting. 09/15/16  Yes Nance Pear, MD  ondansetron (ZOFRAN-ODT) 4 MG disintegrating tablet Take 4 mg by mouth every 8 (eight) hours as needed for nausea or vomiting.   Yes Historical Provider, MD  pantoprazole  (PROTONIX) 40 MG tablet Take 40 mg by mouth 2 (two) times daily.     Yes Historical Provider, MD  Potassium Chloride ER 20 MEQ TBCR Take 20 mEq by mouth 3 (three) times daily.    Yes Historical Provider, MD  ranitidine (ZANTAC) 150 MG tablet Take 150 mg by mouth 2 (two) times daily.   Yes Historical Provider, MD  sennosides-docusate sodium (SENOKOT-S) 8.6-50 MG tablet Take 2 tablets by mouth daily.   Yes Historical Provider, MD  sucralfate (CARAFATE) 1 g tablet Take 1 g by mouth 4 (four) times daily.   Yes Historical Provider, MD  traMADol (ULTRAM) 50 MG tablet Take 1 tablet (50 mg total) by mouth every 6 (six) hours as needed. 04/23/16 04/23/17 Yes Earleen Newport, MD    Current Facility-Administered Medications  Medication Dose Route Frequency Provider Last Rate Last Dose  . 0.9 % NaCl with KCl 20 mEq/ L  infusion   Intravenous Continuous Theodoro Grist, MD 100 mL/hr at 09/17/16 2012    . acetaminophen (TYLENOL) tablet 650 mg  650 mg Oral Q4H PRN Theodoro Grist, MD      . acidophilus (RISAQUAD) capsule 1 capsule  1 capsule Oral BID Theodoro Grist, MD   1 capsule at 09/17/16 2015  . aspirin EC tablet 81 mg  81 mg Oral Daily Theodoro Grist, MD   81 mg at 09/17/16 2015  . atorvastatin (LIPITOR) tablet 80 mg  80 mg Oral Daily Theodoro Grist, MD   80 mg at 09/17/16 2014  . budesonide (PULMICORT) nebulizer solution 0.25 mg  0.25 mg Nebulization BID Theodoro Grist, MD   0.25 mg at 09/17/16 2029  . calcium-vitamin D (OSCAL WITH D) 500-200 MG-UNIT per tablet 1 tablet  1 tablet Oral Daily Theodoro Grist, MD   1 tablet at 09/17/16 2015  . cholecalciferol (VITAMIN D) tablet 1,000 Units  1,000 Units Oral Daily Theodoro Grist, MD   1,000 Units at 09/17/16 2014  . [START ON 09/18/2016] cyanocobalamin ((VITAMIN  B-12)) injection 1,000 mcg  1,000 mcg Intramuscular Q30 days Theodoro Grist, MD      . enoxaparin (LOVENOX) injection 40 mg  40 mg Subcutaneous Q12H Theodoro Grist, MD   40 mg at 09/17/16 2011  . famotidine  (PEPCID) tablet 20 mg  20 mg Oral BID Theodoro Grist, MD   20 mg at 09/17/16 2014  . gabapentin (NEURONTIN) capsule 300 mg  300 mg Oral BID Theodoro Grist, MD   300 mg at 09/17/16 2015  . HYDROcodone-acetaminophen (NORCO) 10-325 MG per tablet 1 tablet  1 tablet Oral Q4H PRN Theodoro Grist, MD   1 tablet at 09/17/16 1921  . insulin aspart (novoLOG) injection 0-5 Units  0-5 Units Subcutaneous QHS Theodoro Grist, MD      . Derrill Memo ON 09/18/2016] insulin aspart (novoLOG) injection 0-9 Units  0-9 Units Subcutaneous TID WC Theodoro Grist, MD      . Derrill Memo ON 09/18/2016] insulin aspart (novoLOG) injection 3 Units  3 Units Subcutaneous TID WC Theodoro Grist, MD      . ipratropium-albuterol (DUONEB) 0.5-2.5 (3) MG/3ML nebulizer solution 3 mL  3 mL Nebulization Q4H PRN Theodoro Grist, MD      . lidocaine (LIDODERM) 5 % 1 patch  1 patch Transdermal Q12H Theodoro Grist, MD      . Derrill Memo ON 09/18/2016] metoprolol succinate (TOPROL-XL) 24 hr tablet 25 mg  25 mg Oral Daily Theodoro Grist, MD      . nitroGLYCERIN (NITROSTAT) SL tablet 0.4 mg  0.4 mg Sublingual Q5 min PRN Theodoro Grist, MD      . ondansetron (ZOFRAN) injection 4 mg  4 mg Intravenous Q6H PRN Theodoro Grist, MD      . ondansetron (ZOFRAN-ODT) disintegrating tablet 4 mg  4 mg Oral Q6H PRN Theodoro Grist, MD      . pantoprazole (PROTONIX) EC tablet 40 mg  40 mg Oral BID Theodoro Grist, MD   40 mg at 09/17/16 2014  . sodium chloride flush (NS) 0.9 % injection 3 mL  3 mL Intravenous Q12H Theodoro Grist, MD   3 mL at 09/17/16 2015  . sucralfate (CARAFATE) tablet 1 g  1 g Oral QID Theodoro Grist, MD   1 g at 09/17/16 2014  . traMADol (ULTRAM) tablet 50 mg  50 mg Oral Q6H PRN Theodoro Grist, MD        Allergies as of 09/17/2016 - Review Complete 09/17/2016  Allergen Reaction Noted  . Citalopram Other (See Comments) 03/21/2011  . Cymbalta [duloxetine hcl] Other (See Comments) 03/21/2011  . Imipramine Other (See Comments) 03/21/2011  . Proton pump inhibitors Other (See  Comments) 03/21/2011  . Venlafaxine Nausea And Vomiting and Other (See Comments) 03/13/2016     Review of Systems:    This is positive for those things mentioned in the HPI, also positive for lower extremity weakness . She also complains of whitish skin discoloration on upper extremities. All other review of systems are negative.       Physical Exam:  Vital signs in last 24 hours: Temp:  [98.1 F (36.7 C)-98.6 F (37 C)] 98.1 F (36.7 C) (10/15 1929) Pulse Rate:  [86-122] 87 (10/15 1929) Resp:  [16-25] 18 (10/15 1929) BP: (109-125)/(56-88) 109/56 (10/15 1929) SpO2:  [94 %-100 %] 100 % (10/15 1929) Weight:  [117 kg (258 lb)] 117 kg (258 lb) (10/15 1112) Last BM Date: 09/17/16  General:  Well-developed, well-nourished and in no acute distress Eyes:  anicteric. ENT:   Mouth and posterior pharynx free of lesions.  Neck:   supple w/o thyromegaly or mass.  Lungs: Clear to auscultation bilaterally. Heart:  S1S2, no rubs, murmurs, gallops. Abdomen:  soft, non-tender, no hepatosplenomegaly, hernia, or mass and BS+. Bulging flanks and rounded.  Rectal: Lymph:  no cervical or supraclavicular adenopathy. Extremities:   no edema Skin   whitish discoloration on bilateral upper extremities. Neuro:  A&O x 3.  Psych:  appropriate mood and  Affect.   Data Reviewed:   LAB RESULTS:  Recent Labs  09/15/16 0848 09/17/16 1115  WBC 7.4 7.4  HGB 12.2 12.0  HCT 36.5 36.3  PLT 276 266   BMET  Recent Labs  09/15/16 0848 09/17/16 1115  NA 138 138  K 3.9 3.5  CL 103 102  CO2 24 26  GLUCOSE 217* 208*  BUN 17 11  CREATININE 0.72 0.75  CALCIUM 9.1 9.1   LFT  Recent Labs  09/17/16 1115  PROT 7.2  ALBUMIN 3.6  AST 35  ALT 20  ALKPHOS 77  BILITOT 0.7   PT/INR No results for input(s): LABPROT, INR in the last 72 hours.  STUDIES: Ct Head Wo Contrast  Result Date: 09/17/2016 CLINICAL DATA:  Pt with N/V/D for over a week now. Pt states that the diarrhea has gotten  worse since yesterday. Pt states that she has also been dizzy and taken multiple falls. Pt denies LOC. EXAM: CT HEAD WITHOUT CONTRAST TECHNIQUE: Contiguous axial images were obtained from the base of the skull through the vertex without intravenous contrast. COMPARISON:  CT head dated 06/23/2016 FINDINGS: Brain: Mild generalized age related parenchymal atrophy with commensurate dilatation of the ventricles and sulci. Chronic small vessel ischemic changes within the bilateral periventricular white matter. There is no mass, hemorrhage, edema or other evidence of acute parenchymal abnormality. No extra-axial hemorrhage. Ventricles are stable in size and configuration. Vascular: There are chronic calcified atherosclerotic changes of the large vessels at the skull base. No unexpected hyperdense vessel. Skull: No acute or suspicious osseous finding. Sinuses/Orbits: No acute finding. Other: None. IMPRESSION: No acute findings.  No intracranial mass, hemorrhage or edema. Electronically Signed   By: Franki Cabot M.D.   On: 09/17/2016 15:07   Ct Abdomen Pelvis W Contrast  Result Date: 09/17/2016 CLINICAL DATA:  82 year old female with a history of nausea and vomiting for week EXAM: CT ABDOMEN AND PELVIS WITH CONTRAST TECHNIQUE: Multidetector CT imaging of the abdomen and pelvis was performed using the standard protocol following bolus administration of intravenous contrast. CONTRAST:  171mL ISOVUE-300 IOPAMIDOL (ISOVUE-300) INJECTION 61% COMPARISON:  06/23/2016 FINDINGS: Lower chest: Partially visualized pacing leads within the heart. Calcifications of mitral annulus. Calcifications of descending thoracic aorta. Atelectasis/ scarring of the bilateral lung bases. Hepatobiliary: Cranial caudal span of the right liver measures 18 cm. Diffusely decreased liver attenuation/ enhancement. Focal fatty sparing in the gallbladder fossa. Unremarkable appearance of gallbladder. Pancreas: Unremarkable. No pancreatic ductal  dilatation or surrounding inflammatory changes. Spleen: Normal in size without focal abnormality. Adrenals/Urinary Tract: Unremarkable appearance of the bilateral adrenal glands. Right kidney demonstrates nonobstructive nephrolithiasis with 1 mm - 2 mm stone in the hilum. No perinephric stranding. Unremarkable course of the right ureter. Left kidney without hydronephrosis. 2 mm -3 mm nonobstructive stone in the hilum of the left kidney. Unchanged low-density cystic structure of the superior medial cortex in the lower lateral cortex, too small to completely characterize. Unremarkable course of the left ureter. Unremarkable appearance of the urinary bladder. Urinary bladder evaluation somewhat limited by streak artifact from the right hip prosthesis.  Stomach/Bowel: Unremarkable stomach. Lipoma of the duodenum, unchanged. Unremarkable small bowel without transition. Normal appendix. Fluid filled colon. No focal inflammatory changes. Minimal diverticular disease. No inflammatory changes in the mesenteric or free fluid. Vascular/Lymphatic: Calcifications of the abdominal aorta. No aneurysm or dissection. No periaortic fluid. Calcifications of the celiac artery origin and superior mesenteric artery origin. Calcifications at the origin of bilateral renal arteries. Calcifications at the origin of the inferior mesenteric artery. The mesenteric vessels and renal arteries are patent, and the degree of stenosis cannot be assessed. Iliac vasculature patent. Proximal femoral vasculature patent. Reproductive: Unremarkable uterus and adnexa Other: No abdominal wall hernia or abnormality. No abdominopelvic ascites. Musculoskeletal: No displaced fracture. Degenerative changes of the spine. Vacuum disc phenomenon most pronounced at the L4-L5 and L5-S1 level. No bony canal narrowing. Surgical changes of right hip arthroplasty. Degenerative changes of the left hip. IMPRESSION: No acute finding on the abdominal/ pelvis CT. Fluid filled  colon, which is nonspecific, potentially representing enteritis/ colitis. Similar appearance of bilateral nonobstructive nephrolithiasis. Hepatomegaly and steatosis Signed, Dulcy Fanny. Earleen Newport, DO [facility] Electronically Signed   By: Corrie Mckusick D.O.   On: 09/17/2016 15:11     PREVIOUS ENDOSCOPIES:                Thanks   LOS: 0 days   San Jetty MD @  09/17/2016, 8:45 PM

## 2016-09-17 NOTE — ED Notes (Signed)
Pt arrived via ems for c/o nausea/vomiting/diarrhea for the last 3-4 days - she was seen in the er 2 days ago but has not gotten any better and today started having chest pain (non-radiating and unchanged with palpation) - pt states that she is weak and has fallen 2 times in the last 24 hours due to weakness in legs

## 2016-09-17 NOTE — Progress Notes (Signed)
FYI-     In consultation with other LCSW who assisted patient during the last week. This patient was originally offered ALF- SNF but patient declined all supported living options. She was insistent she return to [facility] to keep her check and be there. She has been at [facility] for 2 weeks for rehab and was discharged home on the October 12th and back to [place] on the 13th.She was again discharged home with resources.   [employer] LCSW [phone]

## 2016-09-17 NOTE — ED Notes (Signed)
1st attempt to obtain UA contaminated with feces

## 2016-09-17 NOTE — H&P (Addendum)
[facility] [place] NAME: Michele Schultz    MR#:  [id number]  DATE OF BIRTH:  05/30/1931  DATE OF ADMISSION:  09/17/2016  PRIMARY CARE PHYSICIAN: [name], MD   REQUESTING/REFERRING PHYSICIAN:   CHIEF COMPLAINT:   Chief Complaint  Patient presents with  . Nausea  . Emesis    HISTORY OF PRESENT ILLNESS: Michele Schultz  is a 82 y.o. female with a known history of anemia, severe aortic stenosis, chronic diastolic CHF, complete heart block, status post permanent pacemaker, coronary artery disease, gastroesophageal reflux disease disease, hyperlipidemia, morbid obesity, who presents to the hospital with complaints of falls, weakness, diarrhea. When the patient, she was doing well up until a few months ago when she fell down and hurt her leg, she was sent to rehabilitation facility for 1-2 months, she came up from rehabilitation facility, however, feels that she is still very weak and not doing too well. She's been also having intermittent episodes of nausea and vomiting, diarrhea, on-and-off for the past 4-5 months, especially worse over the past one week. Patient  admits of colonoscopy years ago. Due to significant weakness, she presented to emergency room for further evaluation and treatment, hospitalist services were contacted for admission, since patient was noted to be tachycardic and had elevated lactic acid level.   Marland KitchenPAST MEDICAL HISTORY:   Past Medical History:  Diagnosis Date  . Anemia   . Aortic stenosis, severe    a. s/p Magna Ease pericardial tissue valve size 21 mm replacement in 10/2011 for severe AS 11/12; b. echo 07/2015: EF 55-60% mod concentric LVH, GR1DD, LA mildly dilated, PASP 45 mm Hg  . Cardiac pacemaker -st Judes    11/12  . Chronic diastolic heart failure ([place])    a. echo 2014: EF 55-60%, no RWMA, GR1DD, PASP 47 mm Hg; b. echo 07/2015: EF 55-60% mod concentric LVH, GR1DD, LA mildly dilated, PASP 45 mm Hg   . Complete heart block [facility])    a. s/p St Jude PPM 10/2011 (Ser # [id number]).  . Coronary artery disease    a. s/p 2v CABG 11/12 (VG-LAD, VG-OM1); b. Lexiscan 08/2015: low risk, no ischemia, EF 55-65%.  . Enthesopathy of hip region   . Esophageal reflux    followed by Dr.Seigal. stabilized with a combination of Nexium and Zantac  . Fibromyalgia   . HLD (hyperlipidemia)   . Hypertensive heart disease   . Knee joint replacement by other means   . Morbid obesity ([place])   . Neuralgia, neuritis, and radiculitis, unspecified   . Neuropathy ([place])   . Onychia and paronychia of toe   . Osteoarthrosis, unspecified whether generalized or localized, pelvic region and thigh    mainly in her back and knees  . PAF (paroxysmal atrial fibrillation) ([place])    a. brief episodes of AF previously noted on device interrogations.  Marland Kitchen RAD (reactive airway disease)    a. chronic SOB  . Spinal stenosis   . Stress incontinence, female    followed by Dr.Cope  . Type II or unspecified type diabetes mellitus without mention of complication, not stated as uncontrolled    a. pt. reports that she is borderline   . Wide-complex tachycardia ([place])    a. Noted 4/10 - brief episode in ED. Noted again 4/21 in clinic->felt most likely to be atrial tach.    PAST SURGICAL HISTORY: Past Surgical History:  Procedure Laterality Date  . AORTIC VALVE REPLACEMENT  10/17/2011  Procedure: AORTIC VALVE REPLACEMENT (AVR);  Surgeon: Melrose Nakayama, MD;  Location: [place];  Service: Open Heart Surgery;  Laterality: N/A;  . CARDIAC CATHETERIZATION  08/2009   50% stenosis distal left main, 50% stenosis ostial left circumflex.   Marland Kitchen CARDIAC CATHETERIZATION     at [facility]  . CORONARY ARTERY BYPASS GRAFT  10/17/2011   Procedure: CORONARY ARTERY BYPASS GRAFTING (CABG);  Surgeon: Melrose Nakayama, MD;  Location: [place];  Service: Open Heart Surgery;  Laterality: N/A;  Times Two, using right leg greater saphenous vein harvested  endoscopically  . EYE SURGERY     IOL/ after cataracts removed- bilateral   . NASAL SINUS SURGERY  2009  . PERMANENT PACEMAKER INSERTION N/A 10/20/2011   Procedure: PERMANENT PACEMAKER INSERTION;  Surgeon: Thompson Grayer, MD;  Location: [facility] CATH LAB;  Service: Cardiovascular;  Laterality: N/A;  . REPLACEMENT TOTAL KNEE  11/2006   right knee  . TOTAL HIP ARTHROPLASTY  09/2010    SOCIAL HISTORY:  Social History  Substance Use Topics  . Smoking status: Never Smoker  . Smokeless tobacco: Never Used  . Alcohol use No    FAMILY HISTORY:  Family History  Problem Relation Age of Onset  . Heart disease Sister   . Heart disease Brother   . Heart disease Brother   . Heart disease Brother   . Diabetes Other   . Anesthesia problems Neg Hx   . Hypotension Neg Hx   . Malignant hyperthermia Neg Hx   . Pseudochol deficiency Neg Hx     DRUG ALLERGIES:  Allergies  Allergen Reactions  . Citalopram Other (See Comments)    Reaction:  Altered mental status   . Cymbalta [Duloxetine Hcl] Other (See Comments)    Reaction:  Sedative for pt   . Imipramine Other (See Comments)    Reaction:  Unknown   . Proton Pump Inhibitors Other (See Comments)    Reaction:  Unknown   . Venlafaxine Nausea And Vomiting and Other (See Comments)    Reaction:  Dizziness     Review of Systems  Constitutional: Negative for chills, fever and weight loss.  HENT: Negative for congestion.   Eyes: Negative for blurred vision and double vision.  Respiratory: Positive for shortness of breath and wheezing. Negative for cough and sputum production.   Cardiovascular: Positive for leg swelling. Negative for chest pain, palpitations, orthopnea and PND.  Gastrointestinal: Positive for diarrhea, nausea and vomiting. Negative for abdominal pain, blood in stool and constipation.  Genitourinary: Positive for frequency. Negative for dysuria, hematuria and urgency.  Musculoskeletal: Negative for falls.  Neurological: Negative for  dizziness, tremors, focal weakness and headaches.  Endo/Heme/Allergies: Does not bruise/bleed easily.  Psychiatric/Behavioral: Negative for depression. The patient does not have insomnia.     MEDICATIONS AT HOME:  Prior to Admission medications   Medication Sig Start Date End Date Taking? Authorizing Provider  acetaminophen (TYLENOL) 325 MG tablet Take 650 mg by mouth 3 (three) times daily as needed.   Yes Historical Provider, MD  acidophilus (RISAQUAD) CAPS capsule Take 1 capsule by mouth 2 (two) times daily. 06/26/16  Yes Sital Mody, MD  amLODipine (NORVASC) 5 MG tablet Take 1 tablet (5 mg total) by mouth daily. 11/01/15  Yes Lauree Chandler, NP  aspirin EC 81 MG tablet Take 81 mg by mouth daily.   Yes Historical Provider, MD  atorvastatin (LIPITOR) 80 MG tablet Take 80 mg by mouth daily.   Yes Historical Provider, MD  beclomethasone (QVAR)  80 MCG/ACT inhaler Inhale 1 puff into the lungs 2 (two) times daily.   Yes Historical Provider, MD  Calcium Carbonate-Vitamin D 600-400 MG-UNIT tablet Take 1 tablet by mouth daily.   Yes Historical Provider, MD  Cholecalciferol 1000 units capsule Take 1,000 Units by mouth daily.   Yes Historical Provider, MD  cyanocobalamin (,VITAMIN B-12,) 1000 MCG/ML injection Inject 1,000 mcg into the muscle every 30 (thirty) days. On the 16th of each month   Yes Historical Provider, MD  furosemide (LASIX) 20 MG tablet Take 1 tablet (20 mg total) by mouth every other day. 04/06/16  Yes Rogelia Mire, NP  gabapentin (NEURONTIN) 300 MG capsule Take 300 mg by mouth 2 (two) times daily.   Yes Historical Provider, MD  HYDROcodone-acetaminophen (NORCO) 10-325 MG tablet Take 1 tablet by mouth every 4 (four) hours as needed for moderate pain. Reported on 05/30/2016 06/26/16  Yes Sital Mody, MD  ipratropium-albuterol (DUONEB) 0.5-2.5 (3) MG/3ML SOLN Take 3 mLs by nebulization every 6 (six) hours as needed.   Yes Historical Provider, MD  lidocaine (LIDODERM) 5 % Place 1 patch  onto the skin every 12 (twelve) hours. Remove & Discard patch within 12 hours or as directed by MD   Yes Historical Provider, MD  losartan (COZAAR) 25 MG tablet Take 25 mg by mouth daily.   Yes Historical Provider, MD  magnesium hydroxide (MILK OF MAGNESIA) 400 MG/5ML suspension Take 10 mLs by mouth daily as needed for mild constipation.    Yes Historical Provider, MD  metFORMIN (GLUCOPHAGE) 500 MG tablet Take 500 mg by mouth daily.   Yes Historical Provider, MD  metoprolol succinate (TOPROL-XL) 25 MG 24 hr tablet Take 25 mg by mouth daily.   Yes Historical Provider, MD  nitroGLYCERIN (NITROSTAT) 0.4 MG SL tablet Place 1 tablet (0.4 mg total) under the tongue every 5 (five) minutes as needed for chest pain. 01/24/13  Yes Minna Merritts, MD  ondansetron (ZOFRAN) 4 MG tablet Take 4 mg by mouth 2 (two) times daily.   Yes Historical Provider, MD  ondansetron (ZOFRAN) 4 MG tablet Take 1 tablet (4 mg total) by mouth every 8 (eight) hours as needed for nausea or vomiting. 09/15/16  Yes Nance Pear, MD  ondansetron (ZOFRAN-ODT) 4 MG disintegrating tablet Take 4 mg by mouth every 8 (eight) hours as needed for nausea or vomiting.   Yes Historical Provider, MD  pantoprazole (PROTONIX) 40 MG tablet Take 40 mg by mouth 2 (two) times daily.     Yes Historical Provider, MD  Potassium Chloride ER 20 MEQ TBCR Take 20 mEq by mouth 3 (three) times daily.    Yes Historical Provider, MD  ranitidine (ZANTAC) 150 MG tablet Take 150 mg by mouth 2 (two) times daily.   Yes Historical Provider, MD  sennosides-docusate sodium (SENOKOT-S) 8.6-50 MG tablet Take 2 tablets by mouth daily.   Yes Historical Provider, MD  sucralfate (CARAFATE) 1 g tablet Take 1 g by mouth 4 (four) times daily.   Yes Historical Provider, MD  traMADol (ULTRAM) 50 MG tablet Take 1 tablet (50 mg total) by mouth every 6 (six) hours as needed. 04/23/16 04/23/17 Yes Earleen Newport, MD      PHYSICAL EXAMINATION:   VITAL SIGNS: Blood pressure  115/74, pulse (!) 117, temperature 98.1 F (36.7 C), temperature source Oral, resp. rate (!) 25, height 5\' 4"  (1.626 m), weight 117 kg (258 lb), SpO2 96 %.  GENERAL:  82 y.o.-year-old patient lying in the bed with no  acute distress. . Smells of stool EYES: Pupils equal, round, reactive to light and accommodation. No scleral icterus. Extraocular muscles intact.  HEENT: Head atraumatic, normocephalic. Oropharynx and nasopharynx clear.  NECK:  Supple, no jugular venous distention. No thyroid enlargement, no tenderness.  LUNGS: Normal breath sounds bilaterally, no wheezing, rales,rhonchi or crepitation. No use of accessory muscles of respiration.  CARDIOVASCULAR: S1, S2 normal. No murmurs, rubs, or gallops.  ABDOMEN: Soft, nontender, nondistended. Bowel sounds present. No organomegaly or mass.  EXTREMITIES:  trace to 1+ lower extremity and pedal edema, . No cyanosis, or clubbing.  NEUROLOGIC: Cranial nerves II through XII are intact. Muscle strength 5/5 in all extremities. Sensation intact. Gait not checked.  PSYCHIATRIC: The patient is alert and oriented x 3.  SKIN: No obvious rash, lesion, or ulcer.   LABORATORY PANEL:   CBC  Recent Labs Lab 09/15/16 0848 09/17/16 1115  WBC 7.4 7.4  HGB 12.2 12.0  HCT 36.5 36.3  PLT 276 266  MCV 84.5 83.5  MCH 28.2 27.7  MCHC 33.3 33.2  RDW 17.3* 17.0*  LYMPHSABS  --  1.9  MONOABS  --  0.6  EOSABS  --  0.1  BASOSABS  --  0.0   ------------------------------------------------------------------------------------------------------------------  Chemistries   Recent Labs Lab 09/17/16 1115  NA 138  K 3.5  CL 102  CO2 26  GLUCOSE 208*  BUN 11  CREATININE 0.75  CALCIUM 9.1  MG 2.0  AST 35  ALT 20  ALKPHOS 77  BILITOT 0.7   ------------------------------------------------------------------------------------------------------------------  Cardiac Enzymes  Recent Labs Lab 09/17/16 1115  TROPONINI <0.03    ------------------------------------------------------------------------------------------------------------------  RADIOLOGY: Ct Head Wo Contrast  Result Date: 09/17/2016 CLINICAL DATA:  Pt with N/V/D for over a week now. Pt states that the diarrhea has gotten worse since yesterday. Pt states that she has also been dizzy and taken multiple falls. Pt denies LOC. EXAM: CT HEAD WITHOUT CONTRAST TECHNIQUE: Contiguous axial images were obtained from the base of the skull through the vertex without intravenous contrast. COMPARISON:  CT head dated 06/23/2016 FINDINGS: Brain: Mild generalized age related parenchymal atrophy with commensurate dilatation of the ventricles and sulci. Chronic small vessel ischemic changes within the bilateral periventricular white matter. There is no mass, hemorrhage, edema or other evidence of acute parenchymal abnormality. No extra-axial hemorrhage. Ventricles are stable in size and configuration. Vascular: There are chronic calcified atherosclerotic changes of the large vessels at the skull base. No unexpected hyperdense vessel. Skull: No acute or suspicious osseous finding. Sinuses/Orbits: No acute finding. Other: None. IMPRESSION: No acute findings.  No intracranial mass, hemorrhage or edema. Electronically Signed   By: Franki Cabot M.D.   On: 09/17/2016 15:07   Ct Abdomen Pelvis W Contrast  Result Date: 09/17/2016 CLINICAL DATA:  82 year old female with a history of nausea and vomiting for week EXAM: CT ABDOMEN AND PELVIS WITH CONTRAST TECHNIQUE: Multidetector CT imaging of the abdomen and pelvis was performed using the standard protocol following bolus administration of intravenous contrast. CONTRAST:  119mL ISOVUE-300 IOPAMIDOL (ISOVUE-300) INJECTION 61% COMPARISON:  06/23/2016 FINDINGS: Lower chest: Partially visualized pacing leads within the heart. Calcifications of mitral annulus. Calcifications of descending thoracic aorta. Atelectasis/ scarring of the bilateral  lung bases. Hepatobiliary: Cranial caudal span of the right liver measures 18 cm. Diffusely decreased liver attenuation/ enhancement. Focal fatty sparing in the gallbladder fossa. Unremarkable appearance of gallbladder. Pancreas: Unremarkable. No pancreatic ductal dilatation or surrounding inflammatory changes. Spleen: Normal in size without focal abnormality. Adrenals/Urinary Tract: Unremarkable appearance of  the bilateral adrenal glands. Right kidney demonstrates nonobstructive nephrolithiasis with 1 mm - 2 mm stone in the hilum. No perinephric stranding. Unremarkable course of the right ureter. Left kidney without hydronephrosis. 2 mm -3 mm nonobstructive stone in the hilum of the left kidney. Unchanged low-density cystic structure of the superior medial cortex in the lower lateral cortex, too small to completely characterize. Unremarkable course of the left ureter. Unremarkable appearance of the urinary bladder. Urinary bladder evaluation somewhat limited by streak artifact from the right hip prosthesis. Stomach/Bowel: Unremarkable stomach. Lipoma of the duodenum, unchanged. Unremarkable small bowel without transition. Normal appendix. Fluid filled colon. No focal inflammatory changes. Minimal diverticular disease. No inflammatory changes in the mesenteric or free fluid. Vascular/Lymphatic: Calcifications of the abdominal aorta. No aneurysm or dissection. No periaortic fluid. Calcifications of the celiac artery origin and superior mesenteric artery origin. Calcifications at the origin of bilateral renal arteries. Calcifications at the origin of the inferior mesenteric artery. The mesenteric vessels and renal arteries are patent, and the degree of stenosis cannot be assessed. Iliac vasculature patent. Proximal femoral vasculature patent. Reproductive: Unremarkable uterus and adnexa Other: No abdominal wall hernia or abnormality. No abdominopelvic ascites. Musculoskeletal: No displaced fracture. Degenerative  changes of the spine. Vacuum disc phenomenon most pronounced at the L4-L5 and L5-S1 level. No bony canal narrowing. Surgical changes of right hip arthroplasty. Degenerative changes of the left hip. IMPRESSION: No acute finding on the abdominal/ pelvis CT. Fluid filled colon, which is nonspecific, potentially representing enteritis/ colitis. Similar appearance of bilateral nonobstructive nephrolithiasis. Hepatomegaly and steatosis Signed, Dulcy Fanny. Earleen Newport, DO [facility]iology Electronically Signed   By: Corrie Mckusick D.O.   On: 09/17/2016 15:11    EKG: Orders placed or performed during the hospital encounter of 09/17/16  . EKG 12-Lead  . EKG 12-Lead  EKG in emergency room revealed sinus tachycardia at rate of 126 bpm, nonspecific T-wave abnormalities in anterolateral leads. Q waves in inferior leads  IMPRESSION AND PLAN:  Active Problems:   Diarrhea   Dehydration   Metabolic acidosis #1. Diarrhea, seems to be chronic, due to metformin?, admit patient, medical floor, get gastrointestinal panel to rule out bacterial infection, C. difficile was negative, continue supportive therapy, get gastroenterologist for recommendations, likely outpatient colonoscopy. CT of abdomen and pelvis was unremarkable #2. Intermittent nausea and vomiting, I ordered patient clear liquid diet, however, she refused, requested, regular diet, observe her ability to eat and downgrade diet depending on clinical status #3. Lactic acidosis, suspend metformin, continue IV fluids, follow lactic acid level #4. Bilateral lower extremity swelling , hold Lasix due to diarrhea, get ultrasound of the lower extremities to rule out DVT   #5. Diabetes mellitus type 2, continue sliding scale insulin, get hemoglobin A1c   All the records are reviewed and case discussed with ED provider. Management plans discussed with the patient, family and they are in agreement.  CODE STATUS: Code  Status History    Date Active Date Inactive Code Status Order ID Comments User Context   06/23/2016  8:20 PM 06/26/2016  1:26 PM Full Code [id number]  Gladstone Lighter, MD Inpatient   06/16/2016  8:35 PM 06/21/2016 10:09 PM Full Code [id number]  Lytle Butte, MD ED   03/13/2016  8:15 PM 03/14/2016  9:02 PM Full Code [id number]  Henreitta Leber, MD Inpatient   09/13/2015  6:51 PM 09/18/2015  7:31 PM Full Code [id number]  Dustin Flock, MD Inpatient   10/20/2011  1:40 PM 10/22/2011  1:27 PM Full Code [id number]  Tiajuana Amass, RN Inpatient       TOTAL TIME TAKING CARE OF THIS PATIENT: 50  minutes.    Theodoro Grist M.D on 09/17/2016 at 4:00 PM  Between 7am to 6pm - Pager - [phone] After 6pm go to [email or web address] - password [email or web address] [facility]  Office  [phone]  CC: Primary care physician; [name], MD

## 2016-09-17 NOTE — ED Triage Notes (Signed)
Pt arrived via ems for c/o nausea/vomiting/diarrhea for the last 3-4 days - she was seen in the er 2 days ago but has not gotten any better and today started having chest pain (non-radiating and unchanged with palpation)

## 2016-09-17 NOTE — ED Provider Notes (Signed)
[facility] Emergency Department Provider Note   ____________________________________________   First MD Initiated Contact with Patient 09/17/16 1124     (approximate)  I have reviewed the triage vital signs and the nursing notes.   HISTORY  Chief Complaint Nausea and Emesis    HPI Michele Schultz is a 82 y.o. female the extensive previous past medical history, the patient presents today states for the last 3 days she's had vomiting whenever she eats and also multiple loose watery stools are hard to control because they're so loose. She reports having a couple loose stools daily occurring "whenever she eats". Today and yesterday she fell twice while using her walker because she feels so lightheaded while walking, reports she has a malar keep any food down because she feels so nauseated. She is not noticed a fever and she is not having pain at this time.  She is seen in the ER 2 days ago, and reports no improvement since that time and instead rib reports worsening. No chest pain. Denies any recent antibiotic use, denies any recent hospitalization except for ER visit a couple days ago.   Past Medical History:  Diagnosis Date  . Anemia   . Aortic stenosis, severe    a. s/p Magna Ease pericardial tissue valve size 21 mm replacement in 10/2011 for severe AS 11/12; b. echo 07/2015: EF 55-60% mod concentric LVH, GR1DD, LA mildly dilated, PASP 45 mm Hg  . Cardiac pacemaker -st Judes    11/12  . Chronic diastolic heart failure ([place])    a. echo 2014: EF 55-60%, no RWMA, GR1DD, PASP 47 mm Hg; b. echo 07/2015: EF 55-60% mod concentric LVH, GR1DD, LA mildly dilated, PASP 45 mm Hg  . Complete heart block [facility])    a. s/p St Jude PPM 10/2011 (Ser # [id number]).  . Coronary artery disease    a. s/p 2v CABG 11/12 (VG-LAD, VG-OM1); b. Lexiscan 08/2015: low risk, no ischemia, EF 55-65%.  . Enthesopathy of hip region   . Esophageal reflux    followed by Dr.Seigal. stabilized  with a combination of Nexium and Zantac  . Fibromyalgia   . HLD (hyperlipidemia)   . Hypertensive heart disease   . Knee joint replacement by other means   . Morbid obesity ([place])   . Neuralgia, neuritis, and radiculitis, unspecified   . Neuropathy ([place])   . Onychia and paronychia of toe   . Osteoarthrosis, unspecified whether generalized or localized, pelvic region and thigh    mainly in her back and knees  . PAF (paroxysmal atrial fibrillation) ([place])    a. brief episodes of AF previously noted on device interrogations.  Marland Kitchen RAD (reactive airway disease)    a. chronic SOB  . Spinal stenosis   . Stress incontinence, female    followed by Dr.Cope  . Type II or unspecified type diabetes mellitus without mention of complication, not stated as uncontrolled    a. pt. reports that she is borderline   . Wide-complex tachycardia ([place])    a. Noted 4/10 - brief episode in ED. Noted again 4/21 in clinic->felt most likely to be atrial tach.    Patient Active Problem List   Diagnosis Date Noted  . Headache due to trauma 07/03/2016  . Hypertensive heart disease   . Wide-complex tachycardia ([place])   . Pain in the chest   . Emesis   . Weakness of both legs   . Coronary artery disease involving coronary bypass graft of native  heart with unspecified angina pectoris   . S/P AVR (aortic valve replacement)   . Falls frequently   . HLD (hyperlipidemia)   . Fibromyalgia   . RAD (reactive airway disease)   . Morbid obesity ([place])   . Aortic stenosis, severe   . Chronic pain 06/17/2014  . Pain in the muscles 06/19/2013  . Chest pain 06/19/2013  . Wheezing 06/19/2013  . Generalized weakness 06/03/2013  . Dyspnea 01/27/2013  . Nausea 03/05/2012  . Constipation 03/05/2012  . Atrial fibrillation-non sustained 02/09/2012  . Cardiac pacemaker -st Judes   . Chronic diastolic heart failure ([place])   . Coronary artery disease   . Long term (current) use of anticoagulants 01/03/2012  . S/P CABG x 2  11/14/2011  . S/P aortic valve replacement with porcine valve 11/14/2011  . Back pain 11/14/2011  . Complete heart block ([place]) 10/20/2011  . HTN (hypertension) 09/15/2011  . Spinal stenosis 03/22/2011  . Fatigue 03/22/2011  . Hyperlipidemia 03/22/2011    Past Surgical History:  Procedure Laterality Date  . AORTIC VALVE REPLACEMENT  10/17/2011   Procedure: AORTIC VALVE REPLACEMENT (AVR);  Surgeon: [place] Nakayama, MD;  Location: [place];  Service: Open Heart Surgery;  Laterality: N/A;  . CARDIAC CATHETERIZATION  08/2009   50% stenosis distal left main, 50% stenosis ostial left circumflex.   Marland Kitchen CARDIAC CATHETERIZATION     [facility]  . CORONARY ARTERY BYPASS GRAFT  10/17/2011   Procedure: CORONARY ARTERY BYPASS GRAFTING (CABG);  Surgeon: [place] Nakayama, MD;  Location: [place];  Service: Open Heart Surgery;  Laterality: N/A;  Times Two, using right leg greater saphenous vein harvested endoscopically  . EYE SURGERY     IOL/ after cataracts removed- bilateral   . NASAL SINUS SURGERY  2009  . PERMANENT PACEMAKER INSERTION N/A 10/20/2011   Procedure: PERMANENT PACEMAKER INSERTION;  Surgeon: Thompson Grayer, MD;  Location[facility] C[facility] LAB;  Service: Cardiovascular;  Laterality: N/A;  . REPLACEMENT TOTAL KNEE  11/2006   right knee  . TOTAL HIP ARTHROPLASTY  09/2010    Prior to Admission medications   Medication Sig Start Date End Date Taking? Authorizing Provider  acetaminophen (TYLENOL) 325 MG tablet Take 650 mg by mouth 3 (three) times daily as needed.   Yes Historical Provider, MD  acidophilus (RISAQUAD) CAPS capsule Take 1 capsule by mouth 2 (two) times daily. 06/26/16  Yes Sital Mody, MD  amLODipine (NORVASC) 5 MG tablet Take 1 tablet (5 mg total) by mouth daily. 11/01/15  Yes Lauree Chandler, NP  aspirin EC 81 MG tablet Take 81 mg by mouth daily.   Yes Historical Provider, MD  atorvastatin (LIPITOR) 80 MG tablet Take 80 mg by mouth daily.   Yes Historical Provider, MD  beclomethasone  (QVAR) 80 MCG/ACT inhaler Inhale 1 puff into the lungs 2 (two) times daily.   Yes Historical Provider, MD  Calcium Carbonate-Vitamin D 600-400 MG-UNIT tablet Take 1 tablet by mouth daily.   Yes Historical Provider, MD  Cholecalciferol 1000 units capsule Take 1,000 Units by mouth daily.   Yes Historical Provider, MD  cyanocobalamin (,VITAMIN B-12,) 1000 MCG/ML injection Inject 1,000 mcg into the muscle every 30 (thirty) days. On the 16th of each month   Yes Historical Provider, MD  furosemide (LASIX) 20 MG tablet Take 1 tablet (20 mg total) by mouth every other day. 04/06/16  Yes Rogelia Mire, NP  gabapentin (NEURONTIN) 300 MG capsule Take 300 mg by mouth 2 (two) times daily.  Yes Historical Provider, MD  HYDROcodone-acetaminophen (NORCO) 10-325 MG tablet Take 1 tablet by mouth every 4 (four) hours as needed for moderate pain. Reported on 05/30/2016 06/26/16  Yes Sital Mody, MD  ipratropium-albuterol (DUONEB) 0.5-2.5 (3) MG/3ML SOLN Take 3 mLs by nebulization every 6 (six) hours as needed.   Yes Historical Provider, MD  lidocaine (LIDODERM) 5 % Place 1 patch onto the skin every 12 (twelve) hours. Remove & Discard patch within 12 hours or as directed by MD   Yes Historical Provider, MD  losartan (COZAAR) 25 MG tablet Take 25 mg by mouth daily.   Yes Historical Provider, MD  magnesium hydroxide (MILK OF MAGNESIA) 400 MG/5ML suspension Take 10 mLs by mouth daily as needed for mild constipation.    Yes Historical Provider, MD  metFORMIN (GLUCOPHAGE) 500 MG tablet Take 500 mg by mouth daily.   Yes Historical Provider, MD  metoprolol succinate (TOPROL-XL) 25 MG 24 hr tablet Take 25 mg by mouth daily.   Yes Historical Provider, MD  nitroGLYCERIN (NITROSTAT) 0.4 MG SL tablet Place 1 tablet (0.4 mg total) under the tongue every 5 (five) minutes as needed for chest pain. 01/24/13  Yes Minna Merritts, MD  ondansetron (ZOFRAN) 4 MG tablet Take 4 mg by mouth 2 (two) times daily.   Yes Historical Provider, MD    ondansetron (ZOFRAN) 4 MG tablet Take 1 tablet (4 mg total) by mouth every 8 (eight) hours as needed for nausea or vomiting. 09/15/16  Yes Nance Pear, MD  ondansetron (ZOFRAN-ODT) 4 MG disintegrating tablet Take 4 mg by mouth every 8 (eight) hours as needed for nausea or vomiting.   Yes Historical Provider, MD  pantoprazole (PROTONIX) 40 MG tablet Take 40 mg by mouth 2 (two) times daily.     Yes Historical Provider, MD  Potassium Chloride ER 20 MEQ TBCR Take 20 mEq by mouth 3 (three) times daily.    Yes Historical Provider, MD  ranitidine (ZANTAC) 150 MG tablet Take 150 mg by mouth 2 (two) times daily.   Yes Historical Provider, MD  sennosides-docusate sodium (SENOKOT-S) 8.6-50 MG tablet Take 2 tablets by mouth daily.   Yes Historical Provider, MD  sucralfate (CARAFATE) 1 g tablet Take 1 g by mouth 4 (four) times daily.   Yes Historical Provider, MD  traMADol (ULTRAM) 50 MG tablet Take 1 tablet (50 mg total) by mouth every 6 (six) hours as needed. 04/23/16 04/23/17 Yes Earleen Newport, MD    Allergies Citalopram; Cymbalta [duloxetine hcl]; Imipramine; Proton pump inhibitors; and Venlafaxine  Family History  Problem Relation Age of Onset  . Heart disease Sister   . Heart disease Brother   . Heart disease Brother   . Heart disease Brother   . Diabetes Other   . Anesthesia problems Neg Hx   . Hypotension Neg Hx   . Malignant hyperthermia Neg Hx   . Pseudochol deficiency Neg Hx     Social History Social History  Substance Use Topics  . Smoking status: Never Smoker  . Smokeless tobacco: Never Used  . Alcohol use No    Review of Systems Constitutional: No fever/chills Eyes: No visual changes. ENT: No sore throat. Cardiovascular: Denies chest pain. Respiratory: Denies shortness of breath. Gastrointestinal: No abdominal pain.Denies black or bloody stools. No bloody vomit. Reports she just throws up at her she eats Genitourinary: Negative for dysuria. Musculoskeletal:  Negative for back pain. Skin: Negative for rash. Neurological: Negative for headaches, focal weakness or numbness.  10-point ROS otherwise  negative.  ____________________________________________   PHYSICAL EXAM:  VITAL SIGNS: ED Triage Vitals [09/17/16 1112]  Enc Vitals Group     BP 121/87     Pulse Rate (!) 122     Resp (!) 23     Temp 98.1 F (36.7 C)     Temp Source Oral     SpO2 99 %     Weight 258 lb (117 kg)     Height 5\' 4"  (1.626 m)     Head Circumference      Peak Flow      Pain Score 4     Pain Loc      Pain Edu?      Excl. in [place]?     Constitutional: Alert and oriented. Mildly ill appearing, appears dehydrated with dry mucous membranes. Eyes: Conjunctivae are normal. PERRL. EOMI. Head: Atraumatic. Patient does report that she struck her head when she fell this morning, but did not lose consciousness. There is no evidence of head trauma noted by clinical exam. Nose: No congestion/rhinnorhea. Mouth/Throat: Mucous membranes are moderately dry.  Oropharynx non-erythematous. Neck: No stridor.   Cardiovascular: Tachycardic rate, regular rhythm. Grossly normal heart sounds.  Good peripheral circulation. Respiratory: Normal respiratory effort.  No retractions. Lungs CTAB. Gastrointestinal: Soft and nontender. No distention. No rebound or guarding. No evidence of peritonitis. Musculoskeletal: No lower extremity tenderness nor edema.  No joint effusions.  Neurologic:  Normal speech and language. No gross focal neurologic deficits are appreciated. The patient demonstrates bilateral lower extremity weakness, reports she just feels very tired and whenever she tried to walk she is fallen Skin:  Skin is warm, dry and intact. No rash noted. Psychiatric: Mood and affect are normal. Speech and behavior are normal.  ____________________________________________   LABS (all labs ordered are listed, but only abnormal results are displayed)  Labs Reviewed  CBC WITH  DIFFERENTIAL/PLATELET - Abnormal; Notable for the following:       Result Value   RDW 17.0 (*)    All other components within normal limits  COMPREHENSIVE METABOLIC PANEL - Abnormal; Notable for the following:    Glucose, Bld 208 (*)    All other components within normal limits  LACTIC ACID, PLASMA - Abnormal; Notable for the following:    Lactic Acid, Venous 3.6 (*)    All other components within normal limits  C DIFFICILE QUICK SCREEN W PCR REFLEX  GASTROINTESTINAL PANEL BY PCR, STOOL (REPLACES STOOL CULTURE)  URINE CULTURE  LIPASE, BLOOD  TROPONIN I  MAGNESIUM  LACTIC ACID, PLASMA  URINALYSIS COMPLETEWITH MICROSCOPIC ([facility] ONLY)  CK  LACTIC ACID, PLASMA  LACTIC ACID, PLASMA   ____________________________________________  EKG  Reviewed and interpreted by me at less than 15 a.m. Heart rate 125 PR 150 QTc 480 Sinus tachycardia, nonspecific T wave abnormality seen in V2 which may be representative of a year with. No clear evidence of ischemic abnormality  ____________________________________________  RADIOLOGY  Ct Head Wo Contrast  Result Date: 09/17/2016 CLINICAL DATA:  Pt with N/V/D for over a week now. Pt states that the diarrhea has gotten worse since yesterday. Pt states that she has also been dizzy and taken multiple falls. Pt denies LOC. EXAM: CT HEAD WITHOUT CONTRAST TECHNIQUE: Contiguous axial images were obtained from the base of the skull through the vertex without intravenous contrast. COMPARISON:  CT head dated 06/23/2016 FINDINGS: Brain: Mild generalized age related parenchymal atrophy with commensurate dilatation of the ventricles and sulci. Chronic small vessel ischemic changes within the bilateral  periventricular white matter. There is no mass, hemorrhage, edema or other evidence of acute parenchymal abnormality. No extra-axial hemorrhage. Ventricles are stable in size and configuration. Vascular: There are chronic calcified atherosclerotic changes of the  large vessels at the skull base. No unexpected hyperdense vessel. Skull: No acute or suspicious osseous finding. Sinuses/Orbits: No acute finding. Other: None. IMPRESSION: No acute findings.  No intracranial mass, hemorrhage or edema. Electronically Signed   By: Franki Cabot M.D.   On: 09/17/2016 15:07   Ct Abdomen Pelvis W Contrast  Result Date: 09/17/2016 CLINICAL DATA:  82 year old female with a history of nausea and vomiting for week EXAM: CT ABDOMEN AND PELVIS WITH CONTRAST TECHNIQUE: Multidetector CT imaging of the abdomen and pelvis was performed using the standard protocol following bolus administration of intravenous contrast. CONTRAST:  134mL ISOVUE-300 IOPAMIDOL (ISOVUE-300) INJECTION 61% COMPARISON:  06/23/2016 FINDINGS: Lower chest: Partially visualized pacing leads within the heart. Calcifications of mitral annulus. Calcifications of descending thoracic aorta. Atelectasis/ scarring of the bilateral lung bases. Hepatobiliary: Cranial caudal span of the right liver measures 18 cm. Diffusely decreased liver attenuation/ enhancement. Focal fatty sparing in the gallbladder fossa. Unremarkable appearance of gallbladder. Pancreas: Unremarkable. No pancreatic ductal dilatation or surrounding inflammatory changes. Spleen: Normal in size without focal abnormality. Adrenals/Urinary Tract: Unremarkable appearance of the bilateral adrenal glands. Right kidney demonstrates nonobstructive nephrolithiasis with 1 mm - 2 mm stone in the hilum. No perinephric stranding. Unremarkable course of the right ureter. Left kidney without hydronephrosis. 2 mm -3 mm nonobstructive stone in the hilum of the left kidney. Unchanged low-density cystic structure of the superior medial cortex in the lower lateral cortex, too small to completely characterize. Unremarkable course of the left ureter. Unremarkable appearance of the urinary bladder. Urinary bladder evaluation somewhat limited by streak artifact from the right hip  prosthesis. Stomach/Bowel: Unremarkable stomach. Lipoma of the duodenum, unchanged. Unremarkable small bowel without transition. Normal appendix. Fluid filled colon. No focal inflammatory changes. Minimal diverticular disease. No inflammatory changes in the mesenteric or free fluid. Vascular/Lymphatic: Calcifications of the abdominal aorta. No aneurysm or dissection. No periaortic fluid. Calcifications of the celiac artery origin and superior mesenteric artery origin. Calcifications at the origin of bilateral renal arteries. Calcifications at the origin of the inferior mesenteric artery. The mesenteric vessels and renal arteries are patent, and the degree of stenosis cannot be assessed. Iliac vasculature patent. Proximal femoral vasculature patent. Reproductive: Unremarkable uterus and adnexa Other: No abdominal wall hernia or abnormality. No abdominopelvic ascites. Musculoskeletal: No displaced fracture. Degenerative changes of the spine. Vacuum disc phenomenon most pronounced at the L4-L5 and L5-S1 level. No bony canal narrowing. Surgical changes of right hip arthroplasty. Degenerative changes of the left hip. IMPRESSION: No acute finding on the abdominal/ pelvis CT. Fluid filled colon, which is nonspecific, potentially representing enteritis/ colitis. Similar appearance of bilateral nonobstructive nephrolithiasis. Hepatomegaly and steatosis Signed, Dulcy Fanny. Earleen Newport, DO [facility] Radiology Electronically Signed   By: Corrie Mckusick D.O.   On: 09/17/2016 15:11    ____________________________________________   PROCEDURES  Procedure(s) performed: None  Procedures  Critical Care performed: No  ____________________________________________   INITIAL IMPRESSION / ASSESSMENT AND PLAN / ED COURSE  Pertinent labs & imaging results that were available during my care of the patient were reviewed by me and considered in my medical decision making (see chart  for details).  Patient transfer evaluation of vomiting and diarrhea. Now notably tachycardic compared to previous visit, she does not have any abdominal pain  but given her age and ongoing symptomatology occult infectious etiology or possible obstructive etiology or gastroenteritis is strongly considered. Other etiologies also considered, the patient does not complain of anything that sounds like bleeding. We'll send labs including a lactic acid, obtain stool studies, and proceed with CT. In addition she reports falling and striking her head, based on her age we will obtain CT imaging to rule out intracranial trauma.  EKG is suspicious for a U wave and possible electrode abnormality, this will await testing for potassium and magnesium as well as full actually panel. Begin gently hydrated patient with a liter over the next 3R she does have a previous history of congestive heart failure the valve replacement though no murmurs heard today  Clinical Course   ----------------------------------------- 3:29 PM on 09/17/2016 -----------------------------------------  Patient with persistent tachycardia after a liter of fluid, continue slow hydration given a history of congestive heart failure. She feels improved and is not any pain, but given her persistence of tachycardia, and elevated lactate, and a report of falling twice with this illness with diarrhea I feel it would be wise discussed with the patient and she is amenable to being admitted for ongoing hydration and observation. The patient does request a social work consult regarding her living condition.  Case and care discussed with the admitting doctor  ____________________________________________   FINAL CLINICAL IMPRESSION(S) / ED DIAGNOSES  Final diagnoses:  Elevated lactic acid level  Gastroenteritis  Dehydration  Tachycardia      NEW MEDICATIONS STARTED DURING THIS VISIT:  New Prescriptions   No medications on file     Note:   This document was prepared using Dragon voice recognition software and may include unintentional dictation errors.     Delman Kitten, MD 09/17/16 1534

## 2016-09-17 NOTE — ED Notes (Signed)
2nd attempt to obtain urine specimen contaminated with feces.

## 2016-09-17 NOTE — Care Management Obs Status (Signed)
[place] NOTIFICATION   Patient Details  Name: DECLYNN STANDEFER MRN: [id number] Date of Birth: [birth year]   Medicare Observation Status Notification Given:  Yes (On enteric precautions nausea,vomiting,diarrhea. patient verbalized understanding copy left with patient.)    Ival Bible, RN 09/17/2016, 4:33 PM

## 2016-09-18 ENCOUNTER — Encounter: Payer: Self-pay | Admitting: Nurse Practitioner

## 2016-09-18 DIAGNOSIS — I471 Supraventricular tachycardia: Secondary | ICD-10-CM | POA: Diagnosis not present

## 2016-09-18 DIAGNOSIS — I5032 Chronic diastolic (congestive) heart failure: Secondary | ICD-10-CM

## 2016-09-18 DIAGNOSIS — D649 Anemia, unspecified: Secondary | ICD-10-CM

## 2016-09-18 DIAGNOSIS — E860 Dehydration: Secondary | ICD-10-CM | POA: Diagnosis not present

## 2016-09-18 LAB — CBC
HEMATOCRIT: 31.8 % — AB (ref 35.0–47.0)
HEMOGLOBIN: 10.7 g/dL — AB (ref 12.0–16.0)
MCH: 28.2 pg (ref 26.0–34.0)
MCHC: 33.7 g/dL (ref 32.0–36.0)
MCV: 83.7 fL (ref 80.0–100.0)
Platelets: 216 10*3/uL (ref 150–440)
RBC: 3.8 MIL/uL (ref 3.80–5.20)
RDW: 17.1 % — ABNORMAL HIGH (ref 11.5–14.5)
WBC: 6.4 10*3/uL (ref 3.6–11.0)

## 2016-09-18 LAB — BASIC METABOLIC PANEL
ANION GAP: 6 (ref 5–15)
BUN: 8 mg/dL (ref 6–20)
CHLORIDE: 107 mmol/L (ref 101–111)
CO2: 25 mmol/L (ref 22–32)
Calcium: 8.4 mg/dL — ABNORMAL LOW (ref 8.9–10.3)
Creatinine, Ser: 0.52 mg/dL (ref 0.44–1.00)
GFR calc Af Amer: >60 mL/min (ref >60)
GLUCOSE: 141 mg/dL — AB (ref 65–99)
POTASSIUM: 3.9 mmol/L (ref 3.5–5.1)
SODIUM: 138 mmol/L (ref 135–145)

## 2016-09-18 LAB — GLUCOSE, CAPILLARY
GLUCOSE-CAPILLARY: 138 mg/dL — AB (ref 65–99)
GLUCOSE-CAPILLARY: 124 mg/dL — AB (ref 65–99)
GLUCOSE-CAPILLARY: 145 mg/dL — AB (ref 65–99)
Glucose-Capillary: 131 mg/dL — ABNORMAL HIGH (ref 65–99)

## 2016-09-18 LAB — TROPONIN I
Troponin I: <0.03 ng/mL (ref <0.03)
Troponin I: <0.03 ng/mL (ref <0.03)

## 2016-09-18 LAB — HEMOGLOBIN A1C
Hgb A1c MFr Bld: 7.7 % — ABNORMAL HIGH (ref 4.8–5.6)
MEAN PLASMA GLUCOSE: 174 mg/dL

## 2016-09-18 MED ORDER — FUROSEMIDE 20 MG PO TABS
20.0000 mg | ORAL_TABLET | Freq: Every day | ORAL | Status: DC
  Administered 2016-09-18 – 2016-09-22 (×5): 20 mg via ORAL
  Filled 2016-09-18 (×5): qty 1

## 2016-09-18 MED ORDER — METOPROLOL TARTRATE 5 MG/5ML IV SOLN
2.5000 mg | Freq: Once | INTRAVENOUS | Status: AC
  Administered 2016-09-18: 2.5 mg via INTRAVENOUS
  Filled 2016-09-18: qty 5

## 2016-09-18 MED ORDER — METOPROLOL SUCCINATE ER 50 MG PO TB24
50.0000 mg | ORAL_TABLET | Freq: Every day | ORAL | Status: DC
  Filled 2016-09-18: qty 1

## 2016-09-18 NOTE — Care Management Note (Addendum)
Case Management Note  Patient Details  Name: Michele Schultz MRN: [id number] Date of Birth: [birth year]  Subjective/Objective:                  Spoke with patient. She states she has limited memory and limited mobility. She states she is wheelchair bound. She denies needing a hospital bed. Has wheelchair and rolling walker (no seat). Has O2 at home PRN (forgot agency name). She states she will need EMS to get home. Left [facility] "weeks ago". She lives alone but has family locally. She lives at [facility] retirement home- independent living. PCP Physician making housecalls and Ernst Spell, MD. Able to get medications without problems. She would like to use [facility] home health.   Action/Plan: Provided choice of agencies to patient. Referral to [facility] Please add PT, RN, and CNA to discharge order. EMS packet started in case [facility] cannot supply transportation.  Expected Discharge Date:  09/19/16               Expected Discharge Plan:     In-House Referral:     Discharge planning Services  CM Consult  Post Acute Care Choice:  Home Health Choice offered to:  Patient  DME Arranged:    DME Agency:     HH Arranged:    [place] Agency:     Status of Service:  In process, will continue to follow  If discussed at Long Length of Stay Meetings, dates discussed:    Additional Comments:  Marshell Garfinkel, RN 09/18/2016, 8:46 AM

## 2016-09-18 NOTE — Progress Notes (Signed)
[place] [facility] [place] NAME: Michele Schultz    MR#:  [id number]  DATE OF BIRTH:  05-30-1931  SUBJECTIVE:  CHIEF COMPLAINT:   Chief Complaint  Patient presents with  . Nausea  . Emesis    For 4-5 months have c/o nausea , diarrhea- came as worsening for 1 week.  Also have a fib and v tech on tele today.  REVIEW OF SYSTEMS:  CONSTITUTIONAL: No fever, fatigue or weakness.  EYES: No blurred or double vision.  EARS, NOSE, AND THROAT: No tinnitus or ear pain.  RESPIRATORY: No cough, shortness of breath, wheezing or hemoptysis.  CARDIOVASCULAR: No chest pain, orthopnea, edema.  GASTROINTESTINAL: positive for nausea, vomiting, diarrhea or abdominal pain.  GENITOURINARY: No dysuria, hematuria.  ENDOCRINE: No polyuria, nocturia,  HEMATOLOGY: No anemia, easy bruising or bleeding SKIN: No rash or lesion. MUSCULOSKELETAL: No joint pain or arthritis.   NEUROLOGIC: No tingling, numbness, weakness.  PSYCHIATRY: No anxiety or depression.   ROS  DRUG ALLERGIES:   Allergies  Allergen Reactions  . Citalopram Other (See Comments)    Reaction:  Altered mental status   . Cymbalta [Duloxetine Hcl] Other (See Comments)    Reaction:  Sedative for pt   . Imipramine Other (See Comments)    Reaction:  Unknown   . Proton Pump Inhibitors Other (See Comments)    Reaction:  Unknown   . Venlafaxine Nausea And Vomiting and Other (See Comments)    Reaction:  Dizziness     VITALS:  Blood pressure 117/86, pulse (!) 119, temperature 97.8 F (36.6 C), temperature source Oral, resp. rate 20, height 5\' 4"  (1.626 m), weight 117 kg (258 lb), SpO2 100 %.  PHYSICAL EXAMINATION:  GENERAL:  82 y.o.-year-old patient lying in the bed with no acute distress.  EYES: Pupils equal, round, reactive to light and accommodation. No scleral icterus. Extraocular muscles intact.  HEENT: Head atraumatic, normocephalic. Oropharynx and nasopharynx clear.  NECK:  Supple, no jugular  venous distention. No thyroid enlargement, no tenderness.  LUNGS: Normal breath sounds bilaterally, no wheezing, rales,rhonchi or crepitation. No use of accessory muscles of respiration.  CARDIOVASCULAR: S1, S2 fast and regular. No murmurs, rubs, or gallops.  ABDOMEN: Soft, nontender, distended. Bowel sounds present. No organomegaly or mass.  EXTREMITIES: No pedal edema, cyanosis, or clubbing.  NEUROLOGIC: Cranial nerves II through XII are intact. Muscle strength 5/5 in all extremities. Sensation intact. Gait not checked.  PSYCHIATRIC: The patient is alert and oriented x 3.  SKIN: No obvious rash, lesion, or ulcer.   Physical Exam LABORATORY PANEL:   CBC  Recent Labs Lab 09/18/16 0601  WBC 6.4  HGB 10.7*  HCT 31.8*  PLT 216   ------------------------------------------------------------------------------------------------------------------  Chemistries   Recent Labs Lab 09/17/16 1115 09/18/16 0601  NA 138 138  K 3.5 3.9  CL 102 107  CO2 26 25  GLUCOSE 208* 141*  BUN 11 8  CREATININE 0.75 0.52  CALCIUM 9.1 8.4*  MG 2.0  --   AST 35  --   ALT 20  --   ALKPHOS 77  --   BILITOT 0.7  --    ------------------------------------------------------------------------------------------------------------------  Cardiac Enzymes  Recent Labs Lab 09/17/16 2337 09/18/16 0601  TROPONINI <0.03 <0.03   ------------------------------------------------------------------------------------------------------------------  RADIOLOGY:  Ct Head Wo Contrast  Result Date: 09/17/2016 CLINICAL DATA:  Pt with N/V/D for over a week now. Pt states that the diarrhea has gotten worse since yesterday. Pt states that she has also  been dizzy and taken multiple falls. Pt denies LOC. EXAM: CT HEAD WITHOUT CONTRAST TECHNIQUE: Contiguous axial images were obtained from the base of the skull through the vertex without intravenous contrast. COMPARISON:  CT head dated 06/23/2016 FINDINGS: Brain: Mild  generalized age related parenchymal atrophy with commensurate dilatation of the ventricles and sulci. Chronic small vessel ischemic changes within the bilateral periventricular white matter. There is no mass, hemorrhage, edema or other evidence of acute parenchymal abnormality. No extra-axial hemorrhage. Ventricles are stable in size and configuration. Vascular: There are chronic calcified atherosclerotic changes of the large vessels at the skull base. No unexpected hyperdense vessel. Skull: No acute or suspicious osseous finding. Sinuses/Orbits: No acute finding. Other: None. IMPRESSION: No acute findings.  No intracranial mass, hemorrhage or edema. Electronically Signed   By: Franki Cabot M.D.   On: 09/17/2016 15:07   Ct Abdomen Pelvis W Contrast  Result Date: 09/17/2016 CLINICAL DATA:  82 year old female with a history of nausea and vomiting for week EXAM: CT ABDOMEN AND PELVIS WITH CONTRAST TECHNIQUE: Multidetector CT imaging of the abdomen and pelvis was performed using the standard protocol following bolus administration of intravenous contrast. CONTRAST:  117mL ISOVUE-300 IOPAMIDOL (ISOVUE-300) INJECTION 61% COMPARISON:  06/23/2016 FINDINGS: Lower chest: Partially visualized pacing leads within the heart. Calcifications of mitral annulus. Calcifications of descending thoracic aorta. Atelectasis/ scarring of the bilateral lung bases. Hepatobiliary: Cranial caudal span of the right liver measures 18 cm. Diffusely decreased liver attenuation/ enhancement. Focal fatty sparing in the gallbladder fossa. Unremarkable appearance of gallbladder. Pancreas: Unremarkable. No pancreatic ductal dilatation or surrounding inflammatory changes. Spleen: Normal in size without focal abnormality. Adrenals/Urinary Tract: Unremarkable appearance of the bilateral adrenal glands. Right kidney demonstrates nonobstructive nephrolithiasis with 1 mm - 2 mm stone in the hilum. No perinephric stranding. Unremarkable course of the  right ureter. Left kidney without hydronephrosis. 2 mm -3 mm nonobstructive stone in the hilum of the left kidney. Unchanged low-density cystic structure of the superior medial cortex in the lower lateral cortex, too small to completely characterize. Unremarkable course of the left ureter. Unremarkable appearance of the urinary bladder. Urinary bladder evaluation somewhat limited by streak artifact from the right hip prosthesis. Stomach/Bowel: Unremarkable stomach. Lipoma of the duodenum, unchanged. Unremarkable small bowel without transition. Normal appendix. Fluid filled colon. No focal inflammatory changes. Minimal diverticular disease. No inflammatory changes in the mesenteric or free fluid. Vascular/Lymphatic: Calcifications of the abdominal aorta. No aneurysm or dissection. No periaortic fluid. Calcifications of the celiac artery origin and superior mesenteric artery origin. Calcifications at the origin of bilateral renal arteries. Calcifications at the origin of the inferior mesenteric artery. The mesenteric vessels and renal arteries are patent, and the degree of stenosis cannot be assessed. Iliac vasculature patent. Proximal femoral vasculature patent. Reproductive: Unremarkable uterus and adnexa Other: No abdominal wall hernia or abnormality. No abdominopelvic ascites. Musculoskeletal: No displaced fracture. Degenerative changes of the spine. Vacuum disc phenomenon most pronounced at the L4-L5 and L5-S1 level. No bony canal narrowing. Surgical changes of right hip arthroplasty. Degenerative changes of the left hip. IMPRESSION: No acute finding on the abdominal/ pelvis CT. Fluid filled colon, which is nonspecific, potentially representing enteritis/ colitis. Similar appearance of bilateral nonobstructive nephrolithiasis. Hepatomegaly and steatosis Signed, Dulcy Fanny. Earleen Newport, DO [facility] Radiology Electronically Signed   By: Corrie Mckusick D.O.   On: 09/17/2016  15:11   US Venous Img Lower Bilateral  Result Date: 09/17/2016 CLINICAL DATA:  82 year old female with bilateral lower extremity swelling  EXAM: BILATERAL LOWER EXTREMITY VENOUS DOPPLER ULTRASOUND TECHNIQUE: Gray-scale sonography with graded compression, as well as color Doppler and duplex ultrasound were performed to evaluate the lower extremity deep venous systems from the level of the common femoral vein and including the common femoral, femoral, profunda femoral, popliteal and calf veins including the posterior tibial, peroneal and gastrocnemius veins when visible. The superficial great saphenous vein was also interrogated. Spectral Doppler was utilized to evaluate flow at rest and with distal augmentation maneuvers in the common femoral, femoral and popliteal veins. COMPARISON:  Left lower extremity ultrasound dated 11/05/2006 FINDINGS: RIGHT LOWER EXTREMITY Common Femoral Vein: No evidence of thrombus. Normal compressibility, respiratory phasicity and response to augmentation. Saphenofemoral Junction: No evidence of thrombus. Normal compressibility and flow on color Doppler imaging. Profunda Femoral Vein: No evidence of thrombus. Normal compressibility and flow on color Doppler imaging. Femoral Vein: No evidence of thrombus. Normal compressibility, respiratory phasicity and response to augmentation. Popliteal Vein: No evidence of thrombus. Normal compressibility, respiratory phasicity and response to augmentation. Calf Veins: No evidence of thrombus. Normal compressibility and flow on color Doppler imaging. Superficial Great Saphenous Vein: No evidence of thrombus. Normal compressibility and flow on color Doppler imaging. Venous Reflux:  None. Other Findings:  None. LEFT LOWER EXTREMITY Common Femoral Vein: No evidence of thrombus. Normal compressibility, respiratory phasicity and response to augmentation. Saphenofemoral Junction: No evidence of thrombus. Normal compressibility and flow on color Doppler  imaging. Profunda Femoral Vein: No evidence of thrombus. Normal compressibility and flow on color Doppler imaging. Femoral Vein: No evidence of thrombus. Normal compressibility, respiratory phasicity and response to augmentation. Popliteal Vein: No evidence of thrombus. Normal compressibility, respiratory phasicity and response to augmentation. Calf Veins: No evidence of thrombus. Normal compressibility and flow on color Doppler imaging. Superficial Great Saphenous Vein: No evidence of thrombus. Normal compressibility and flow on color Doppler imaging. Venous Reflux:  None. Other Findings:  None. IMPRESSION: No evidence of deep venous thrombosis in the lower extremities. Electronically Signed   By: Anner Crete M.D.   On: 09/17/2016 21:00    ASSESSMENT AND PLAN:   Active Problems:   Diarrhea   Dehydration   Metabolic acidosis  #1. Diarrhea, seems to be chronic, due to metformin?,   get gastrointestinal panel negative to rule out bacterial infection, C. difficile was negative, continue supportive therapy, get gastroenterologist for recommendations, likely outpatient colonoscopy. CT of abdomen and pelvis was unremarkable #2. Intermittent nausea and vomiting,   clear liquid diet, however, she refused, requested, regular diet,   supportive care for nausea. #3. Lactic acidosis, suspend metformin, continue IV fluids, follow lactic acid level #4. Bilateral lower extremity swelling , negatvie ultrasound of the lower extremities to rule out DVT   #5. Diabetes mellitus type 2, continue sliding scale insulin, 7.7- hemoglobin A1c #6 A fib with RVR- have Hx of CHF and PPM    Cardiology consult.   Magnesium normal.    All the records are reviewed and case discussed with Care Management/Social Workerr. Management plans discussed with the patient, family and they are in agreement.  CODE STATUS: full.  TOTAL TIME TAKING CARE OF THIS PATIENT: 35 minutes.     POSSIBLE D/C IN 1-2 DAYS, DEPENDING ON  CLINICAL CONDITION.   Vaughan Basta M.D on 09/18/2016   Between 7am to 6pm - Pager - [phone]  After 6pm go to [email or web address] - password [email or web address] [facility]  Office  [phone]  CC: Primary care physician; [name], MD  Note: This dictation  was prepared with Dragon dictation along with smaller phrase technology. Any transcriptional errors that result from this process are unintentional.

## 2016-09-18 NOTE — Progress Notes (Signed)
Chaplain making rounds visited patient to introduction Ch services and talked to the Pt. 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 While the patient didn't show any sign of depression, she was still mourning the death of her husband who passed away last year. Pt asked for prayers which chaplain provided with presence.    09/18/16 1300  Clinical Encounter Type  Visited With Patient  Visit Type Initial  Referral From Nurse  Consult/Referral To Chaplain  Spiritual Encounters  Spiritual Needs Prayer

## 2016-09-18 NOTE — Plan of Care (Signed)
Problem: Health Behavior/Discharge Planning: Goal: Ability to manage health-related needs will improve Outcome: Not Progressing RN found about 10 pills that the patient was hiding in her bed sheets that she had not taken. She is also refusing to use her walker to get around in her room.   Deri Fuelling, RN

## 2016-09-18 NOTE — Progress Notes (Signed)
CSW requested a TB test to be placed for ALF admission.  Ernest Pine, MSW, LCSW, [place] Clinical Social Worker [phone]

## 2016-09-18 NOTE — Progress Notes (Signed)
Spoke with Dr Anselm Jungling regarding pts VT on the monitor. 129-132 states he will come see her. Pt noted to be sleeping at this time.

## 2016-09-18 NOTE — Evaluation (Signed)
Physical Therapy Evaluation Patient Details Name: Michele Schultz MRN: [id number] DOB: [birth year] Today's Date: 09/18/2016   History of Present Illness  Pt admitted for diarrhea. Pt with complaints of nausea/weakness after returning home from SNF. Pt lives at [facility] at this time. Pt with history of pacemaker, aortic stenosis, anemia, and GERD. Pt also admits recent fall at home  Clinical Impression  Pt is a pleasant 82 year old female who was admitted for diarrhea. Pt performs bed mobility with mod assist, transfers with min assist, and ambulation with min assist and RW. Pt demonstrates deficits with strength/mobility/endurance/balance. Pt recently discharged from rehab. Would benefit from skilled PT to address above deficits and promote optimal return to PLOF; recommend transition to STR upon discharge from acute hospitalization.       Follow Up Recommendations SNF (or other form of LTC)    Equipment Recommendations       Recommendations for Other Services       Precautions / Restrictions Precautions Precautions: Fall Restrictions Weight Bearing Restrictions: No      Mobility  Bed Mobility Overal bed mobility: Needs Assistance Bed Mobility: Supine to Sit     Supine to sit: Mod assist     General bed mobility comments: assist for sliding B LE off bed and scooting out towards EOB. Once seated, able to maintain balance  Transfers Overall transfer level: Needs assistance Equipment used: Rolling walker (2 wheeled) Transfers: Sit to/from Stand Sit to Stand: Min assist         General transfer comment: assist for transfer to upright posture. Needs increased momentum to stand with upright posture. Impulsive and needs cues to use AD correctly  Ambulation/Gait Ambulation/Gait assistance: Min assist Ambulation Distance (Feet): 5 Feet Assistive device: Rolling walker (2 wheeled) Gait Pattern/deviations: Step-to pattern     General Gait Details: very short  step length with pt reaching for furniture and letting go of RW at times. Pt fatigues with short distance. First ambulates to [facility] and then to recliner. Unsafe with RW  Stairs            Wheelchair Mobility    Modified Rankin (Stroke Patients Only)       Balance Overall balance assessment: History of Falls;Needs assistance Sitting-balance support: Feet supported Sitting balance-Leahy Scale: Good     Standing balance support: Bilateral upper extremity supported Standing balance-Leahy Scale: Fair                               Pertinent Vitals/Pain Pain Assessment:  (reports severe nausea-RN notified and getting meds)    Home Living Family/patient expects to be discharged to:: Assisted living Living Arrangements: Alone             Home Equipment: Bedside commode;Shower seat;Walker - 4 wheels;Wheelchair - manual      Prior Function Level of Independence: Needs assistance   Gait / Transfers Assistance Needed: Patient reports utilizing rollator and wheelchair at home.           Hand Dominance        Extremity/Trunk Assessment   Upper Extremity Assessment: Generalized weakness (B UE grossly 3+/5)           Lower Extremity Assessment: Generalized weakness (B LE grossly 4/5)         Communication   Communication: No difficulties  Cognition Arousal/Alertness: Awake/alert Behavior During Therapy: WFL for tasks assessed/performed;Impulsive Overall Cognitive Status: Impaired/Different from baseline Area of  Impairment: Orientation Orientation Level: Disoriented to;Place                  General Comments      Exercises Other Exercises Other Exercises: Pt ambulated to [facility] and needs min assist for donning/doffing brief. Pt also needs B BSC for future use, RN notified.   Assessment/Plan    PT Assessment Patient needs continued PT services  PT Problem List Decreased strength;Decreased activity tolerance;Decreased balance;Decreased  mobility          PT Treatment Interventions Gait training;DME instruction;Therapeutic exercise;Therapeutic activities    PT Goals (Current goals can be found in the Care Plan section)  Acute Rehab PT Goals Patient Stated Goal: to not vomit PT Goal Formulation: With patient Time For Goal Achievement: 10/02/16 Potential to Achieve Goals: Good    Frequency Min 2X/week   Barriers to discharge Inaccessible home environment;Decreased caregiver support      Co-evaluation               End of Session Equipment Utilized During Treatment: Gait belt Activity Tolerance: Patient tolerated treatment well Patient left: in chair;with chair alarm set Nurse Communication: Mobility status    Functional Assessment Tool Used: clinical judgement Functional Limitation: Mobility: Walking and moving around Mobility: Walking and Moving Around Current Status [phone]): At least 20 percent but less than 40 percent impaired, limited or restricted Mobility: Walking and Moving Around Goal Status [phone]): At least 1 percent but less than 20 percent impaired, limited or restricted    Time: 1016-1035 PT Time Calculation (min) (ACUTE ONLY): 19 min   Charges:   PT Evaluation $PT Eval Moderate Complexity: 1 Procedure PT Treatments $Therapeutic Activity: 8-22 mins   PT G Codes:   PT G-Codes **NOT FOR INPATIENT CLASS** Functional Assessment Tool Used: clinical judgement Functional Limitation: Mobility: Walking and moving around Mobility: Walking and Moving Around Current Status [phone]): At least 20 percent but less than 40 percent impaired, limited or restricted Mobility: Walking and Moving Around Goal Status [phone]): At least 1 percent but less than 20 percent impaired, limited or restricted    Michele Schultz 09/18/2016, 11:51 AM  Greggory Stallion, PT, DPT [phone]

## 2016-09-18 NOTE — Progress Notes (Signed)
Patient requested Milk of mag. Education given to patient without change.Abd soft and patient denies pain. Patient unsure of last Bm. Dr. Ara Kussmaul notified no new orders given.

## 2016-09-18 NOTE — Consult Note (Signed)
Cardiology Consult    Patient ID: Michele Schultz MRN: [id number], DOB/AGE: May 22, 1931   Admit date: 09/17/2016 Date of Consult: 09/18/2016  Primary Physician: [name], MD Primary Cardiologist: [name], MD  Requesting Provider: [name]  Patient Profile    82 y/o ? With a history of hypertension, hyperlipidemia, CAD, aortic stenosis status post bioprosthetic aortic valve replacement, complete heart block status post permanent pacemaker, diabetes, obesity, and chronic pain who was admitted on October 15 secondary to progressive nausea and chronic diarrhea, whom we've been asked to evaluate secondary to development of atrial tachycardia.  Past Medical History   Past Medical History:  Diagnosis Date  . Anemia   . Aortic stenosis, severe    a. s/p Magna Ease pericardial tissue valve size 21 mm replacement in 10/2011 for severe AS 11/12; b. echo 07/2015: EF 55-60% mod concentric LVH, GR1DD, LA mildly dilated, PASP 45 mm Hg  . Atrial tachycardia, paroxysmal (HCC)    a. with rate related LBBB.  . Cardiac pacemaker -st Judes    11/12  . Chronic diastolic heart failure ([place])    a. echo 2014: EF 55-60%, no RWMA, GR1DD, PASP 47 mm Hg; b. echo 07/2015: EF 55-60% mod concentric LVH, GR1DD, LA mildly dilated, PASP 45 mm Hg  . Complete heart block [facility])    a. s/p St Jude PPM 10/2011 (Ser # [id number]).  . Coronary artery disease    a. s/p 2v CABG 11/12 (VG-LAD, VG-OM1); b. Lexiscan 08/2015: low risk, no ischemia, EF 55-65%.  . Enthesopathy of hip region   . Esophageal reflux    followed by Dr.Seigal. stabilized with a combination of Nexium and Zantac  . Fibromyalgia   . HLD (hyperlipidemia)   . Hypertensive heart disease   . Knee joint replacement by other means   . Morbid obesity ([place])   . Neuralgia, neuritis, and radiculitis, unspecified   . Neuropathy ([place])   . Onychia and paronychia of toe   . Osteoarthrosis, unspecified whether generalized or localized, pelvic region  and thigh    mainly in her back and knees  . PAF (paroxysmal atrial fibrillation) ([place])    a. brief episodes of AF previously noted on device interrogations.  Marland Kitchen RAD (reactive airway disease)    a. chronic SOB  . Spinal stenosis   . Stress incontinence, female    followed by Dr.Cope  . Type II or unspecified type diabetes mellitus without mention of complication, not stated as uncontrolled    a. pt. reports that she is borderline   . Wide-complex tachycardia ([place])    a. Noted 4/10 - brief episode in ED. Noted again 4/21 in clinic->felt most likely to be atrial tach.    Past Surgical History:  Procedure Laterality Date  . AORTIC VALVE REPLACEMENT  10/17/2011   Procedure: AORTIC VALVE REPLACEMENT (AVR);  Surgeon: Melrose Nakayama, MD;  Location: [place];  Service: Open Heart Surgery;  Laterality: N/A;  . CARDIAC CATHETERIZATION  08/2009   50% stenosis distal left main, 50% stenosis ostial left circumflex.   Marland Kitchen CARDIAC CATHETERIZATION     at [facility]  . CORONARY ARTERY BYPASS GRAFT  10/17/2011   Procedure: CORONARY ARTERY BYPASS GRAFTING (CABG);  Surgeon: Melrose Nakayama, MD;  Location: [place];  Service: Open Heart Surgery;  Laterality: N/A;  Times Two, using right leg greater saphenous vein harvested endoscopically  . EYE SURGERY     IOL/ after cataracts removed- bilateral   . NASAL SINUS SURGERY  2009  .  PERMANENT PACEMAKER INSERTION N/A 10/20/2011   Procedure: PERMANENT PACEMAKER INSERTION;  Surgeon: Thompson Grayer, MD;  Location: [facility] CATH LAB;  Service: Cardiovascular;  Laterality: N/A;  . REPLACEMENT TOTAL KNEE  11/2006   right knee  . TOTAL HIP ARTHROPLASTY  09/2010     Allergies  Allergies  Allergen Reactions  . Citalopram Other (See Comments)    Reaction:  Altered mental status   . Cymbalta [Duloxetine Hcl] Other (See Comments)    Reaction:  Sedative for pt   . Imipramine Other (See Comments)    Reaction:  Unknown   . Proton Pump Inhibitors Other (See Comments)     Reaction:  Unknown   . Venlafaxine Nausea And Vomiting and Other (See Comments)    Reaction:  Dizziness     History of Present Illness    82 year old female with the above complex past medical history. She is status post coronary artery bypass grafting and bioprosthetic aortic valve replacement November 2012, and also has a history of diastolic CHF, complete heart block status post pacemaker, paroxysmal atrial tachycardia with rate related left bundle branch block, hypertension, hyperlipidemia, obesity, diabetes, and chronic pain in the setting of fibromyalgia and rheumatoid arthritis. She also has somewhat chronic exertional chest pain and dyspnea with a negative Myoview in September 2016. She says that she has been experiencing persistent nausea as well as diarrhea over several months. Whereas the diarrhea is chronic, her nausea seems to wax and wane though recently has been worse and more persistent. She denies a history of vomiting. She presented to the [facility] ED on October 15 for further evaluation and was noted to be tachycardic with an elevated lactic acid level. She has been treated with IV fluids and offered a clear liquid diet, however she has requested a regular diet and has been tolerating it this morning. Though she was tachycardic in the ED, her rates overnight were in the 80s to 90s. This morning at 8:39, she abruptly became tachycardic with rates initially in the 120s. This appeared to be sinus and then at 8:42 this morning, she developed a rate related left bundle branch block. Rhythm was regular and similar in appearance to prior episodes of atrial tachycardia as noted on 12-lead ECG on 03/24/2016. She continued to have a wide-complex tachycardia in the high 120s to 130s right up until 9:17 AM. It appears the battery on the telemetry died at that point and when she was placed back on the monitor at 940, she remained tachycardic in the upper 120's though her complex was narrow. She has  remained tachycardic with intermittent left bundle branch block. She is completely asymptomatic and denies chest pain, palpitations, dyspnea, presyncope, or syncope.  Inpatient Medications    . acidophilus  1 capsule Oral BID  . aspirin EC  81 mg Oral Daily  . atorvastatin  80 mg Oral Daily  . budesonide (PULMICORT) nebulizer solution  0.25 mg Nebulization BID  . calcium-vitamin D  1 tablet Oral Daily  . cholecalciferol  1,000 Units Oral Daily  . cyanocobalamin  1,000 mcg Intramuscular Q30 days  . enoxaparin (LOVENOX) injection  40 mg Subcutaneous Q12H  . famotidine  20 mg Oral BID  . furosemide  20 mg Oral Daily  . gabapentin  300 mg Oral BID  . insulin aspart  0-5 Units Subcutaneous QHS  . insulin aspart  0-9 Units Subcutaneous TID WC  . insulin aspart  3 Units Subcutaneous TID WC  . lidocaine  1 patch Transdermal Q12H  .  metoprolol  2.5 mg Intravenous Once  . [START ON 09/19/2016] metoprolol succinate  50 mg Oral Daily  . pantoprazole  40 mg Oral BID  . sodium chloride flush  3 mL Intravenous Q12H  . sucralfate  1 g Oral QID    Family History    Family History  Problem Relation Age of Onset  . Heart disease Sister   . Heart disease Brother   . Heart disease Brother   . Heart disease Brother   . Diabetes Other   . Anesthesia problems Neg Hx   . Hypotension Neg Hx   . Malignant hyperthermia Neg Hx   . Pseudochol deficiency Neg Hx     Social History    Social History   Social History  . Marital status: Widowed    Spouse name: N/A  . Number of children: N/A  . Years of education: N/A   Occupational History  . Not on file.   Social History Main Topics  . Smoking status: Never Smoker  . Smokeless tobacco: Never Used  . Alcohol use No  . Drug use: No  . Sexual activity: Not on file   Other Topics Concern  . Not on file   Social History Narrative  . No narrative on file     Review of Systems    General:  No chills, fever, night sweats or weight  changes.  Cardiovascular:  No chest pain, dyspnea on exertion, edema, orthopnea, palpitations, paroxysmal nocturnal dyspnea. Dermatological: No rash, lesions/masses Respiratory: No cough, dyspnea Urologic: No hematuria, dysuria Abdominal:   +++ nausea, diarrhea.  H& P indicated that she was having vomiting, though she denies this today. No bright red blood per rectum, melena, or hematemesis Neurologic:  No visual changes, +++ wkns, changes in mental status. All other systems reviewed and are otherwise negative except as noted above.  Physical Exam    Blood pressure 117/86, pulse (!) 119, temperature 97.8 F (36.6 C), temperature source Oral, resp. rate 20, height 5\' 4"  (1.626 m), weight 258 lb (117 kg), SpO2 100 %.  General: Pleasant, NAD Psych: Normal affect. Neuro: Alert and oriented X 3. Moves all extremities spontaneously. HEENT: Normal  Neck: Supple without bruits or JVD. Lungs:  Resp regular and unlabored, CTA. Heart: RRR no s3, s4, 2/6 SEM RUSB. Abdomen: Soft, non-tender, non-distended, BS + x 4.  Extremities: No clubbing, cyanosis.  Trace bilat LE edema. DP/PT/Radials 2+ and equal bilaterally.  Labs     Recent Labs  09/17/16 1115 09/17/16 1832 09/17/16 2337 09/18/16 0601  CKTOTAL 46  --   --   --   TROPONINI <0.03 <0.03 <0.03 <0.03   Lab Results  Component Value Date   WBC 6.4 09/18/2016   HGB 10.7 (L) 09/18/2016   HCT 31.8 (L) 09/18/2016   MCV 83.7 09/18/2016   PLT 216 09/18/2016     Recent Labs Lab 09/17/16 1115 09/18/16 0601  NA 138 138  K 3.5 3.9  CL 102 107  CO2 26 25  BUN 11 8  CREATININE 0.75 0.52  CALCIUM 9.1 8.4*  PROT 7.2  --   BILITOT 0.7  --   ALKPHOS 77  --   ALT 20  --   AST 35  --   GLUCOSE 208* 141*   Radiology Studies    Ct Head Wo Contrast  Result Date: 09/17/2016 CLINICAL DATA:  Pt with N/V/D for over a week now. Pt states that the diarrhea has gotten worse since yesterday. Pt states that  she has also been dizzy and taken  multiple falls. Pt denies LOC. EXAM: CT HEAD WITHOUT CONTRAST TECHNIQUE: Contiguous axial images were obtained from the base of the skull through the vertex without intravenous contrast. COMPARISON:  CT head dated 06/23/2016 FINDINGS: Brain: Mild generalized age related parenchymal atrophy with commensurate dilatation of the ventricles and sulci. Chronic small vessel ischemic changes within the bilateral periventricular white matter. There is no mass, hemorrhage, edema or other evidence of acute parenchymal abnormality. No extra-axial hemorrhage. Ventricles are stable in size and configuration. Vascular: There are chronic calcified atherosclerotic changes of the large vessels at the skull base. No unexpected hyperdense vessel. Skull: No acute or suspicious osseous finding. Sinuses/Orbits: No acute finding. Other: None. IMPRESSION: No acute findings.  No intracranial mass, hemorrhage or edema. Electronically Signed   By: Franki Cabot M.D.   On: 09/17/2016 15:07   Ct Abdomen Pelvis W Contrast  Result Date: 09/17/2016 CLINICAL DATA:  82 year old female with a history of nausea and vomiting for week EXAM: CT ABDOMEN AND PELVIS WITH CONTRAST TECHNIQUE: Multidetector CT imaging of the abdomen and pelvis was performed using the standard protocol following bolus administration of intravenous contrast. CONTRAST:  184mL ISOVUE-300 IOPAMIDOL (ISOVUE-300) INJECTION 61% COMPARISON:  06/23/2016 FINDINGS: Lower chest: Partially visualized pacing leads within the heart. Calcifications of mitral annulus. Calcifications of descending thoracic aorta. Atelectasis/ scarring of the bilateral lung bases. Hepatobiliary: Cranial caudal span of the right liver measures 18 cm. Diffusely decreased liver attenuation/ enhancement. Focal fatty sparing in the gallbladder fossa. Unremarkable appearance of gallbladder. Pancreas: Unremarkable. No pancreatic ductal dilatation or surrounding inflammatory changes. Spleen: Normal in size without  focal abnormality. Adrenals/Urinary Tract: Unremarkable appearance of the bilateral adrenal glands. Right kidney demonstrates nonobstructive nephrolithiasis with 1 mm - 2 mm stone in the hilum. No perinephric stranding. Unremarkable course of the right ureter. Left kidney without hydronephrosis. 2 mm -3 mm nonobstructive stone in the hilum of the left kidney. Unchanged low-density cystic structure of the superior medial cortex in the lower lateral cortex, too small to completely characterize. Unremarkable course of the left ureter. Unremarkable appearance of the urinary bladder. Urinary bladder evaluation somewhat limited by streak artifact from the right hip prosthesis. Stomach/Bowel: Unremarkable stomach. Lipoma of the duodenum, unchanged. Unremarkable small bowel without transition. Normal appendix. Fluid filled colon. No focal inflammatory changes. Minimal diverticular disease. No inflammatory changes in the mesenteric or free fluid. Vascular/Lymphatic: Calcifications of the abdominal aorta. No aneurysm or dissection. No periaortic fluid. Calcifications of the celiac artery origin and superior mesenteric artery origin. Calcifications at the origin of bilateral renal arteries. Calcifications at the origin of the inferior mesenteric artery. The mesenteric vessels and renal arteries are patent, and the degree of stenosis cannot be assessed. Iliac vasculature patent. Proximal femoral vasculature patent. Reproductive: Unremarkable uterus and adnexa Other: No abdominal wall hernia or abnormality. No abdominopelvic ascites. Musculoskeletal: No displaced fracture. Degenerative changes of the spine. Vacuum disc phenomenon most pronounced at the L4-L5 and L5-S1 level. No bony canal narrowing. Surgical changes of right hip arthroplasty. Degenerative changes of the left hip. IMPRESSION: No acute finding on the abdominal/ pelvis CT. Fluid filled colon, which is nonspecific, potentially representing enteritis/ colitis.  Similar appearance of bilateral nonobstructive nephrolithiasis. Hepatomegaly and steatosis Signed, Dulcy Fanny. Earleen Newport, DO [facility] Radiology Electronically Signed   By: Corrie Mckusick D.O.   On: 09/17/2016 15:11   US Venous Img Lower Bilateral  Result Date: 09/17/2016 CLINICAL DATA:  82 year old female with bilateral  lower extremity swelling EXAM: BILATERAL LOWER EXTREMITY VENOUS DOPPLER ULTRASOUND TECHNIQUE: Gray-scale sonography with graded compression, as well as color Doppler and duplex ultrasound were performed to evaluate the lower extremity deep venous systems from the level of the common femoral vein and including the common femoral, femoral, profunda femoral, popliteal and calf veins including the posterior tibial, peroneal and gastrocnemius veins when visible. The superficial great saphenous vein was also interrogated. Spectral Doppler was utilized to evaluate flow at rest and with distal augmentation maneuvers in the common femoral, femoral and popliteal veins. COMPARISON:  Left lower extremity ultrasound dated 11/05/2006 FINDINGS: RIGHT LOWER EXTREMITY Common Femoral Vein: No evidence of thrombus. Normal compressibility, respiratory phasicity and response to augmentation. Saphenofemoral Junction: No evidence of thrombus. Normal compressibility and flow on color Doppler imaging. Profunda Femoral Vein: No evidence of thrombus. Normal compressibility and flow on color Doppler imaging. Femoral Vein: No evidence of thrombus. Normal compressibility, respiratory phasicity and response to augmentation. Popliteal Vein: No evidence of thrombus. Normal compressibility, respiratory phasicity and response to augmentation. Calf Veins: No evidence of thrombus. Normal compressibility and flow on color Doppler imaging. Superficial Great Saphenous Vein: No evidence of thrombus. Normal compressibility and flow on color Doppler imaging. Venous Reflux:  None. Other  Findings:  None. LEFT LOWER EXTREMITY Common Femoral Vein: No evidence of thrombus. Normal compressibility, respiratory phasicity and response to augmentation. Saphenofemoral Junction: No evidence of thrombus. Normal compressibility and flow on color Doppler imaging. Profunda Femoral Vein: No evidence of thrombus. Normal compressibility and flow on color Doppler imaging. Femoral Vein: No evidence of thrombus. Normal compressibility, respiratory phasicity and response to augmentation. Popliteal Vein: No evidence of thrombus. Normal compressibility, respiratory phasicity and response to augmentation. Calf Veins: No evidence of thrombus. Normal compressibility and flow on color Doppler imaging. Superficial Great Saphenous Vein: No evidence of thrombus. Normal compressibility and flow on color Doppler imaging. Venous Reflux:  None. Other Findings:  None. IMPRESSION: No evidence of deep venous thrombosis in the lower extremities. Electronically Signed   By: Anner Crete M.D.   On: 09/17/2016 21:00    ECG & Cardiac Imaging    Sinus tach, 117, LAD, LAE, inf infarct.  Tele - was sinus in 80's with abrupt increase to 120's- 130's @ 8:39 am, with rate related LBBB developing @ 8:42.  Intermittent LBBB since then.  Assessment & Plan    1. Nausea with chronic diarrhea: This seems to be somewhat improved. She is tolerating breakfast this morning. She says it has been going on for at least 5 or 6 months. Management per internal medicine. Question role of diabetic gastroparesis versus side effect from metformin.  2.  Paroxysmal atrial tachycardia with rate related left bundle branch block: This has been previously documented with ECG in April showing the same. She is completely asymptomatic. There was an abrupt increase in heart rate just after 8:30 this morning with development of rate-related left bundle at 8:42 AM. She has remained tachycardic with intermittent left bundle since then. She is completely  asymptomatic. When narrow complex, it appears to be a sinus rhythm though the abrupt change in rate suggest possible arrhythmia. I will give a dose of IV metoprolol now to see if we can slow her down some. She is already on Toprol and I will increase this to 50 mg daily.  3. Chronic diastolic congestive heart failure: Weight has really been quite variable over time. She is relatively euvolemic on exam today. Blood pressure is stable while heart rate is elevated  as noted above. Adjust beta blocker as above. Continue by mouth Lasix. BUN and creatinine are stable that she is not likely significantly dehydrated.  4. Aortic stenosis status post biomechanical aortic valve replacement: No significant AS or AI on echo in August 2016.  5. Hypertensive heart disease: Blood pressure is stable on current therapy -beta blocker and low-dose Lasix.  6. Type 2 diabetes mellitus:  per internal medicine.  7.  Normocytic anemia:  H/H down slightly since admission.  Follow.  Signed, Murray Hodgkins, NP 09/18/2016, 12:49 PM

## 2016-09-18 NOTE — Progress Notes (Signed)
OT Cancellation Note  Patient Details Name: Michele Schultz MRN: [id number] DOB: [birth year]   Cancelled Treatment:    Reason Eval/Treat Not Completed: Medical issues which prohibited therapy (Lactic Acid at Critical levels. WIll continue to monitor.Marland KitchenHarrel Carina, MS, OTR/L 09/18/2016, 9:21 AM

## 2016-09-18 NOTE — Clinical Social Work Note (Signed)
Clinical Social Work Assessment  Patient Details  Name: Michele Schultz MRN: [id number] Date of Birth: [birth year]  Date of referral:  09/18/16               Reason for consult:  Discharge Planning                Permission sought to share information with:    Permission granted to share information::     Name::        Agency::     Relationship::     Contact Information:     Housing/Transportation Living arrangements for the past 2 months:  Apartment [facility]) Source of Information:  Patient Patient Interpreter Needed:  None Criminal Activity/Legal Involvement Pertinent to Current Situation/Hospitalization:  No - Comment as needed Significant Relationships:  None Lives with:  Self Do you feel safe going back to the place where you live?  No Need for family participation in patient care:  No (Coment)  Care giving concerns:  Patient was discharged from [facility] recently and went home with [facility]. PT recommends STR for patient but patient is Medicare Observation. Patient interested in ALF placement.    Social Worker assessment / plan:  CSW met with patient at [facility]. Introduced herself and her role. Per patient she lives at [facility]. She reports that she was recently at [facility] and discharged to [facility]. Stated that from [facility] she left and got [place] services. CSW discussed Medicare Observation with patient and how Medicare will not pay for SNF placement at this time. Patient reports that she cannot afford SNF placement. Stated she cannot go home because she cannot walk. Reported that she's interested in ALF placement. Reported that she wants to go to a nice facility. Stated she understood that she'll have to give up her apartment at [facility] and her SSI check. Granted CSW verbal permission to complete ALF bed search. CSW called patient's niece Adela Lank and left voicemail. Awaiting bed offers. Awaiting call back. CSW will continue to follow and assist.   Employment  status:  Retired Forensic scientist:  Medicare PT Recommendations:  [place] / Referral to community resources:  [place]  Patient/Family's Response to care:  Patient in agreement for ALF placement.   Patient/Family's Understanding of and Emotional Response to Diagnosis, Current Treatment, and Prognosis:  Stated she understood. Thanked CSW for assistance.   Emotional Assessment Appearance:  Appears stated age Attitude/Demeanor/Rapport:   (None) Affect (typically observed):  Accepting, Calm, Pleasant Orientation:  Oriented to Self, Oriented to Place, Oriented to  Time, Oriented to Situation Alcohol / Substance use:  Not Applicable Psych involvement (Current and /or in the community):  No (Comment)  Discharge Needs  Concerns to be addressed:  Discharge Planning Concerns Readmission within the last 30 days:  No Current discharge risk:  Chronically ill Barriers to Discharge:  Continued Medical Work up   [employer], [place] 09/18/2016, 5:08 PM

## 2016-09-19 DIAGNOSIS — I471 Supraventricular tachycardia: Secondary | ICD-10-CM

## 2016-09-19 DIAGNOSIS — M6281 Muscle weakness (generalized): Secondary | ICD-10-CM | POA: Diagnosis not present

## 2016-09-19 DIAGNOSIS — Z515 Encounter for palliative care: Secondary | ICD-10-CM | POA: Diagnosis not present

## 2016-09-19 DIAGNOSIS — E860 Dehydration: Secondary | ICD-10-CM | POA: Diagnosis not present

## 2016-09-19 DIAGNOSIS — Z66 Do not resuscitate: Secondary | ICD-10-CM | POA: Diagnosis not present

## 2016-09-19 LAB — GLUCOSE, CAPILLARY
GLUCOSE-CAPILLARY: 129 mg/dL — AB (ref 65–99)
GLUCOSE-CAPILLARY: 151 mg/dL — AB (ref 65–99)
Glucose-Capillary: 135 mg/dL — ABNORMAL HIGH (ref 65–99)
Glucose-Capillary: 117 mg/dL — ABNORMAL HIGH (ref 65–99)

## 2016-09-19 MED ORDER — METOCLOPRAMIDE HCL 10 MG PO TABS
5.0000 mg | ORAL_TABLET | Freq: Three times a day (TID) | ORAL | Status: DC
  Administered 2016-09-19 – 2016-09-22 (×12): 5 mg via ORAL
  Filled 2016-09-19 (×12): qty 1

## 2016-09-19 MED ORDER — OXYCODONE HCL 5 MG PO TABS
5.0000 mg | ORAL_TABLET | Freq: Four times a day (QID) | ORAL | Status: DC | PRN
  Administered 2016-09-19: 5 mg via ORAL
  Filled 2016-09-19: qty 1

## 2016-09-19 MED ORDER — METOPROLOL SUCCINATE ER 50 MG PO TB24
75.0000 mg | ORAL_TABLET | Freq: Every day | ORAL | Status: DC
  Administered 2016-09-19: 75 mg via ORAL
  Filled 2016-09-19: qty 1

## 2016-09-19 NOTE — Progress Notes (Signed)
Patient: Michele Schultz / Admit Date: 09/17/2016 / Date of Encounter: 09/19/2016, 9:08 AM   Subjective: Still with nausea. No SOB or chest pain. Currently in sinus rhythm with heart rate in the 90's bpm. Episode of atrial tachycardia into the 120's bpm overnight with rate-related WCT.   Review of Systems: Review of Systems  Constitutional: Positive for malaise/fatigue. Negative for chills, diaphoresis, fever and weight loss.  HENT: Negative for congestion.   Eyes: Negative for discharge and redness.  Respiratory: Negative for cough, hemoptysis, sputum production, shortness of breath and wheezing.   Cardiovascular: Negative for chest pain, palpitations, orthopnea, claudication, leg swelling and PND.  Gastrointestinal: Positive for nausea. Negative for abdominal pain, blood in stool, heartburn, melena and vomiting.  Genitourinary: Negative for hematuria.  Musculoskeletal: Negative for falls and myalgias.  Skin: Negative for rash.  Neurological: Negative for dizziness, tingling, tremors, sensory change, speech change, focal weakness, loss of consciousness and weakness.  Endo/Heme/Allergies: Does not bruise/bleed easily.  Psychiatric/Behavioral: Negative for substance abuse. The patient is not nervous/anxious.   All other systems reviewed and are negative.   Objective: Telemetry: currently NSR, 90's bpm (narrow complex), overnight with episode of atrial tachycardia with rate related wide complex Physical Exam: Blood pressure (!) 117/55, pulse 88, temperature 99.5 F (37.5 C), temperature source Oral, resp. rate 18, height 5\' 4"  (1.626 m), weight 258 lb (117 kg), SpO2 95 %. Body mass index is 44.29 kg/m. General: Well developed, well nourished, in no acute distress. Head: Normocephalic, atraumatic, sclera non-icteric, no xanthomas, nares are without discharge. Neck: Negative for carotid bruits. JVP not elevated. Lungs: Clear bilaterally to auscultation without wheezes, rales, or  rhonchi. Breathing is unlabored. Heart: RRR S1 S2 without murmurs, rubs, or gallops.  Abdomen: Obese, soft, non-tender, non-distended with normoactive bowel sounds. No rebound/guarding. Extremities: No clubbing or cyanosis. No edema. Distal pedal pulses are 2+ and equal bilaterally. Neuro: Alert and oriented X 3. Moves all extremities spontaneously. Psych:  Responds to questions appropriately with a normal affect.   Intake/Output Summary (Last 24 hours) at 09/19/16 0908 Last data filed at 09/19/16 0600  Gross per 24 hour  Intake              963 ml  Output                0 ml  Net              963 ml    Inpatient Medications:  . acidophilus  1 capsule Oral BID  . aspirin EC  81 mg Oral Daily  . atorvastatin  80 mg Oral Daily  . budesonide (PULMICORT) nebulizer solution  0.25 mg Nebulization BID  . calcium-vitamin D  1 tablet Oral Daily  . cholecalciferol  1,000 Units Oral Daily  . cyanocobalamin  1,000 mcg Intramuscular Q30 days  . enoxaparin (LOVENOX) injection  40 mg Subcutaneous Q12H  . famotidine  20 mg Oral BID  . furosemide  20 mg Oral Daily  . gabapentin  300 mg Oral BID  . insulin aspart  0-5 Units Subcutaneous QHS  . insulin aspart  0-9 Units Subcutaneous TID WC  . insulin aspart  3 Units Subcutaneous TID WC  . lidocaine  1 patch Transdermal Q12H  . metoprolol succinate  50 mg Oral Daily  . pantoprazole  40 mg Oral BID  . sodium chloride flush  3 mL Intravenous Q12H  . sucralfate  1 g Oral QID   Infusions:    Labs:  Recent Labs  09/17/16 1115 09/18/16 0601  NA 138 138  K 3.5 3.9  CL 102 107  CO2 26 25  GLUCOSE 208* 141*  BUN 11 8  CREATININE 0.75 0.52  CALCIUM 9.1 8.4*  MG 2.0  --     Recent Labs  09/17/16 1115  AST 35  ALT 20  ALKPHOS 77  BILITOT 0.7  PROT 7.2  ALBUMIN 3.6    Recent Labs  09/17/16 1115 09/18/16 0601  WBC 7.4 6.4  NEUTROABS 4.8  --   HGB 12.0 10.7*  HCT 36.3 31.8*  MCV 83.5 83.7  PLT 266 216    Recent Labs   09/17/16 1115 09/17/16 1832 09/17/16 2337 09/18/16 0601  CKTOTAL 46  --   --   --   TROPONINI <0.03 <0.03 <0.03 <0.03   Invalid input(s): POCBNP  Recent Labs  09/17/16 1115  HGBA1C 7.7*     Weights: Filed Weights   09/17/16 1112  Weight: 258 lb (117 kg)     Radiology/Studies:  Ct Head Wo Contrast  Result Date: 09/17/2016 CLINICAL DATA:  Pt with N/V/D for over a week now. Pt states that the diarrhea has gotten worse since yesterday. Pt states that she has also been dizzy and taken multiple falls. Pt denies LOC. EXAM: CT HEAD WITHOUT CONTRAST TECHNIQUE: Contiguous axial images were obtained from the base of the skull through the vertex without intravenous contrast. COMPARISON:  CT head dated 06/23/2016 FINDINGS: Brain: Mild generalized age related parenchymal atrophy with commensurate dilatation of the ventricles and sulci. Chronic small vessel ischemic changes within the bilateral periventricular white matter. There is no mass, hemorrhage, edema or other evidence of acute parenchymal abnormality. No extra-axial hemorrhage. Ventricles are stable in size and configuration. Vascular: There are chronic calcified atherosclerotic changes of the large vessels at the skull base. No unexpected hyperdense vessel. Skull: No acute or suspicious osseous finding. Sinuses/Orbits: No acute finding. Other: None. IMPRESSION: No acute findings.  No intracranial mass, hemorrhage or edema. Electronically Signed   By: Franki Cabot M.D.   On: 09/17/2016 15:07   Ct Abdomen Pelvis W Contrast  Result Date: 09/17/2016 CLINICAL DATA:  82 year old female with a history of nausea and vomiting for week EXAM: CT ABDOMEN AND PELVIS WITH CONTRAST TECHNIQUE: Multidetector CT imaging of the abdomen and pelvis was performed using the standard protocol following bolus administration of intravenous contrast. CONTRAST:  188mL ISOVUE-300 IOPAMIDOL (ISOVUE-300) INJECTION 61% COMPARISON:  06/23/2016 FINDINGS: Lower chest:  Partially visualized pacing leads within the heart. Calcifications of mitral annulus. Calcifications of descending thoracic aorta. Atelectasis/ scarring of the bilateral lung bases. Hepatobiliary: Cranial caudal span of the right liver measures 18 cm. Diffusely decreased liver attenuation/ enhancement. Focal fatty sparing in the gallbladder fossa. Unremarkable appearance of gallbladder. Pancreas: Unremarkable. No pancreatic ductal dilatation or surrounding inflammatory changes. Spleen: Normal in size without focal abnormality. Adrenals/Urinary Tract: Unremarkable appearance of the bilateral adrenal glands. Right kidney demonstrates nonobstructive nephrolithiasis with 1 mm - 2 mm stone in the hilum. No perinephric stranding. Unremarkable course of the right ureter. Left kidney without hydronephrosis. 2 mm -3 mm nonobstructive stone in the hilum of the left kidney. Unchanged low-density cystic structure of the superior medial cortex in the lower lateral cortex, too small to completely characterize. Unremarkable course of the left ureter. Unremarkable appearance of the urinary bladder. Urinary bladder evaluation somewhat limited by streak artifact from the right hip prosthesis. Stomach/Bowel: Unremarkable stomach. Lipoma of the duodenum, unchanged. Unremarkable small bowel without transition. Normal  appendix. Fluid filled colon. No focal inflammatory changes. Minimal diverticular disease. No inflammatory changes in the mesenteric or free fluid. Vascular/Lymphatic: Calcifications of the abdominal aorta. No aneurysm or dissection. No periaortic fluid. Calcifications of the celiac artery origin and superior mesenteric artery origin. Calcifications at the origin of bilateral renal arteries. Calcifications at the origin of the inferior mesenteric artery. The mesenteric vessels and renal arteries are patent, and the degree of stenosis cannot be assessed. Iliac vasculature patent. Proximal femoral vasculature patent.  Reproductive: Unremarkable uterus and adnexa Other: No abdominal wall hernia or abnormality. No abdominopelvic ascites. Musculoskeletal: No displaced fracture. Degenerative changes of the spine. Vacuum disc phenomenon most pronounced at the L4-L5 and L5-S1 level. No bony canal narrowing. Surgical changes of right hip arthroplasty. Degenerative changes of the left hip. IMPRESSION: No acute finding on the abdominal/ pelvis CT. Fluid filled colon, which is nonspecific, potentially representing enteritis/ colitis. Similar appearance of bilateral nonobstructive nephrolithiasis. Hepatomegaly and steatosis Signed, Dulcy Fanny. Earleen Newport, DO [facility] Radiology Electronically Signed   By: Corrie Mckusick D.O.   On: 09/17/2016 15:11   US Venous Img Lower Bilateral  Result Date: 09/17/2016 CLINICAL DATA:  82 year old female with bilateral lower extremity swelling EXAM: BILATERAL LOWER EXTREMITY VENOUS DOPPLER ULTRASOUND TECHNIQUE: Gray-scale sonography with graded compression, as well as color Doppler and duplex ultrasound were performed to evaluate the lower extremity deep venous systems from the level of the common femoral vein and including the common femoral, femoral, profunda femoral, popliteal and calf veins including the posterior tibial, peroneal and gastrocnemius veins when visible. The superficial great saphenous vein was also interrogated. Spectral Doppler was utilized to evaluate flow at rest and with distal augmentation maneuvers in the common femoral, femoral and popliteal veins. COMPARISON:  Left lower extremity ultrasound dated 11/05/2006 FINDINGS: RIGHT LOWER EXTREMITY Common Femoral Vein: No evidence of thrombus. Normal compressibility, respiratory phasicity and response to augmentation. Saphenofemoral Junction: No evidence of thrombus. Normal compressibility and flow on color Doppler imaging. Profunda Femoral Vein: No evidence of thrombus. Normal  compressibility and flow on color Doppler imaging. Femoral Vein: No evidence of thrombus. Normal compressibility, respiratory phasicity and response to augmentation. Popliteal Vein: No evidence of thrombus. Normal compressibility, respiratory phasicity and response to augmentation. Calf Veins: No evidence of thrombus. Normal compressibility and flow on color Doppler imaging. Superficial Great Saphenous Vein: No evidence of thrombus. Normal compressibility and flow on color Doppler imaging. Venous Reflux:  None. Other Findings:  None. LEFT LOWER EXTREMITY Common Femoral Vein: No evidence of thrombus. Normal compressibility, respiratory phasicity and response to augmentation. Saphenofemoral Junction: No evidence of thrombus. Normal compressibility and flow on color Doppler imaging. Profunda Femoral Vein: No evidence of thrombus. Normal compressibility and flow on color Doppler imaging. Femoral Vein: No evidence of thrombus. Normal compressibility, respiratory phasicity and response to augmentation. Popliteal Vein: No evidence of thrombus. Normal compressibility, respiratory phasicity and response to augmentation. Calf Veins: No evidence of thrombus. Normal compressibility and flow on color Doppler imaging. Superficial Great Saphenous Vein: No evidence of thrombus. Normal compressibility and flow on color Doppler imaging. Venous Reflux:  None. Other Findings:  None. IMPRESSION: No evidence of deep venous thrombosis in the lower extremities. Electronically Signed   By: Anner Crete M.D.   On: 09/17/2016 21:00     Assessment and Plan  Active Problems:   Diarrhea   Dehydration   Metabolic acidosis    1. Nausea with chronic diarrhea: Possibly 2/2 metformin vs diabetic gastroparesis. Per IM.  2. Paroxysmal atrial tachycardia: Currently in normal sinus rhythm with heart rate in the 90's bpm. Episode of atrial tachycardia overnight with heart rate in the 120's bpm with rate-related wide complex. Asymptomatic.  Increase Toprol XL to 75 mg daily. Known issue for her.  3. Chronic diastolic CHF: She is relatively euvolemic at this time. BP stable. Toprol as above. Continue PO Lasix.  4. Hypertensive heart disease: BP stable.   Signed, Christell Faith, PA-C [place] Pager: [phone] 09/19/2016, 9:08 AM

## 2016-09-19 NOTE — Consult Note (Signed)
Consultation Note Date: 09/19/2016   Patient Name: Michele Schultz  DOB: 09/03/1931  MRN: [id number]  Age / Sex: 82 y.o., female  PCP: [name], MD Referring Physician: Vaughan Basta, MD  Reason for Consultation: Establishing goals of care and Psychosocial/spiritual support  HPI/Patient Profile: 82 y.o. female   admitted on 09/17/2016 with  known history of anemia, severe aortic stenosis, chronic diastolic CHF, complete heart block, status post permanent pacemaker, coronary artery disease, gastroesophageal reflux disease disease, hyperlipidemia, morbid obesity, who presents to the hospital with complaints of falls, weakness, diarrhea.   Multiple re-hospitalizations in the past 6 months; continued physical and functional decline.  Per patient I"I cannot live alone anymore"  When the patient, she was doing well up until a few months ago when she fell down and hurt her leg, she was sent to rehabilitation facility for 1-2 months, she came up from rehabilitation facility, however, feels that she is still very weak and not doing too well.   She's been also having intermittent episodes of nausea and vomiting, diarrhea, on-and-off for the past 4-5 months, especially worse over the past one week.   Faced with advanced directive decisions and anticipatory care needs.  Clinical Assessment and Goals of Care:   This NP Wadie Lessen reviewed medical records, received report from team, assessed the patient and then meet at the patient's bedside   to discuss diagnosis, prognosis, GOC, EOL wishes disposition and options.  I then spoke to [place] [name].  Patient has no children   A  discussion was had today regarding advanced directives.  Concepts specific to code status, artifical feeding and hydration, continued IV antibiotics and rehospitalization was had.  The difference between a  aggressive medical intervention path  and a palliative comfort care path for this patient at this time was had.  Values and goals of care important to patient and family were attempted to be elicited.  Discussed importance of discussion with family and documentation of wishes for herself as life transitions.  She verbalized the difficulty of losing her independence.   MOST form was introduced  Concept of  Palliative Care was discussed    Questions and concerns addressed.   Family encouraged to call with questions or concerns.  PMT will continue to support holistically.    SUMMARY OF RECOMMENDATIONS    Code Status/Advance Care Planning:  DNR--documented today.    Symptom Management:  Pain: Tylenol 650 mg po every 4 hrs prn if no relief           Oxycodone 5 mg po every 6 hr prn   Palliative Prophylaxis:   Bowel Regimen and Frequent Pain Assessment   Psycho-social/Spiritual:   Desire for further Chaplaincy support:no   Additional Recommendations: Coversation regarding facility options discussed  Prognosis:   Unable to determine  Discharge Planning:  Recommend Palliative to follow on discharge    To Be Determined      Primary Diagnoses: Present on Admission: **None**   I have reviewed the medical record, interviewed  the patient and family, and examined the patient. The following aspects are pertinent.  Past Medical History:  Diagnosis Date  . Anemia   . Aortic stenosis, severe    a. s/p Magna Ease pericardial tissue valve size 21 mm replacement in 10/2011 for severe AS 11/12; b. echo 07/2015: EF 55-60% mod concentric LVH, GR1DD, LA mildly dilated, PASP 45 mm Hg  . Atrial tachycardia, paroxysmal (HCC)    a. with rate related LBBB.  . Cardiac pacemaker -st Judes    11/12  . Chronic diastolic heart failure ([place])    a. echo 2014: EF 55-60%, no RWMA, GR1DD, PASP 47 mm Hg; b. echo 07/2015: EF 55-60% mod concentric LVH, GR1DD, LA mildly dilated, PASP 45 mm Hg  .  Complete heart block [facility])    a. s/p St Jude PPM 10/2011 (Ser # [id number]).  . Coronary artery disease    a. s/p 2v CABG 11/12 (VG-LAD, VG-OM1); b. Lexiscan 08/2015: low risk, no ischemia, EF 55-65%.  . Enthesopathy of hip region   . Esophageal reflux    followed by Dr.Seigal. stabilized with a combination of Nexium and Zantac  . Fibromyalgia   . HLD (hyperlipidemia)   . Hypertensive heart disease   . Knee joint replacement by other means   . Morbid obesity ([place])   . Neuralgia, neuritis, and radiculitis, unspecified   . Neuropathy ([place])   . Onychia and paronychia of toe   . Osteoarthrosis, unspecified whether generalized or localized, pelvic region and thigh    mainly in her back and knees  . PAF (paroxysmal atrial fibrillation) ([place])    a. brief episodes of AF previously noted on device interrogations.  Mar[facility] RAD (reactive airway disease)    a. chronic SOB  . Spinal stenosis   . Stress incontinence, female    followed by Dr.Cope  . Type II or unspecified type diabetes mellitus without mention of complication, not stated as uncontrolled    a. pt. reports that she is borderline   . Wide-complex tachycardia ([place])    a. Noted 4/10 - brief episode in ED. Noted again 4/21 in clinic->felt most likely to be atrial tach.   Social History   Social History  . Marital status: Widowed    Spouse name: N/A  . Number of children: N/A  . Years of education: N/A   Social History Main Topics  . Smoking status: Never Smoker  . Smokeless tobacco: Never Used  . Alcohol use No  . Drug use: No  . Sexual activity: Not Asked   Other Topics Concern  . None   Social History Narrative  . None   Family History  Problem Relation Age of Onset  . Heart disease Sister   . Heart disease Brother   . Heart disease Brother   . Heart disease Brother   . Diabetes Other   . Anesthesia problems Neg Hx   . Hypotension Neg Hx   . Malignant hyperthermia Neg Hx   . Pseudochol deficiency Neg Hx    Scheduled  Meds: . acidophilus  1 capsule Oral BID  . aspirin EC  81 mg Oral Daily  . atorvastatin  80 mg Oral Daily  . budesonide (PULMICORT) nebulizer solution  0.25 mg Nebulization BID  . calcium-vitamin D  1 tablet Oral Daily  . cholecalciferol  1,000 Units Oral Daily  . cyanocobalamin  1,000 mcg Intramuscular Q30 days  . enoxaparin (LOVENOX) injection  40 mg Subcutaneous Q12H  . famotidine  20 mg Oral  BID  . furosemide  20 mg Oral Daily  . gabapentin  300 mg Oral BID  . insulin aspart  0-5 Units Subcutaneous QHS  . insulin aspart  0-9 Units Subcutaneous TID WC  . insulin aspart  3 Units Subcutaneous TID WC  . lidocaine  1 patch Transdermal Q12H  . metoprolol succinate  75 mg Oral Daily  . pantoprazole  40 mg Oral BID  . sodium chloride flush  3 mL Intravenous Q12H  . sucralfate  1 g Oral QID   Continuous Infusions:  PRN Meds:.acetaminophen, HYDROcodone-acetaminophen, ipratropium-albuterol, nitroGLYCERIN, [DISCONTINUED] ondansetron **OR** ondansetron (ZOFRAN) IV, ondansetron, traMADol Medications Prior to Admission:  Prior to Admission medications   Medication Sig Start Date End Date Taking? Authorizing Provider  acetaminophen (TYLENOL) 325 MG tablet Take 650 mg by mouth 3 (three) times daily as needed.   Yes Historical Provider, MD  acidophilus (RISAQUAD) CAPS capsule Take 1 capsule by mouth 2 (two) times daily. 06/26/16  Yes Sital Mody, MD  amLODipine (NORVASC) 5 MG tablet Take 1 tablet (5 mg total) by mouth daily. 11/01/15  Yes Lauree Chandler, NP  aspirin EC 81 MG tablet Take 81 mg by mouth daily.   Yes Historical Provider, MD  atorvastatin (LIPITOR) 80 MG tablet Take 80 mg by mouth daily.   Yes Historical Provider, MD  beclomethasone (QVAR) 80 MCG/ACT inhaler Inhale 1 puff into the lungs 2 (two) times daily.   Yes Historical Provider, MD  Calcium Carbonate-Vitamin D 600-400 MG-UNIT tablet Take 1 tablet by mouth daily.   Yes Historical Provider, MD  Cholecalciferol 1000 units capsule  Take 1,000 Units by mouth daily.   Yes Historical Provider, MD  cyanocobalamin (,VITAMIN B-12,) 1000 MCG/ML injection Inject 1,000 mcg into the muscle every 30 (thirty) days. On the 16th of each month   Yes Historical Provider, MD  furosemide (LASIX) 20 MG tablet Take 1 tablet (20 mg total) by mouth every other day. 04/06/16  Yes Rogelia Mire, NP  gabapentin (NEURONTIN) 300 MG capsule Take 300 mg by mouth 2 (two) times daily.   Yes Historical Provider, MD  HYDROcodone-acetaminophen (NORCO) 10-325 MG tablet Take 1 tablet by mouth every 4 (four) hours as needed for moderate pain. Reported on 05/30/2016 06/26/16  Yes Sital Mody, MD  ipratropium-albuterol (DUONEB) 0.5-2.5 (3) MG/3ML SOLN Take 3 mLs by nebulization every 6 (six) hours as needed.   Yes Historical Provider, MD  lidocaine (LIDODERM) 5 % Place 1 patch onto the skin every 12 (twelve) hours. Remove & Discard patch within 12 hours or as directed by MD   Yes Historical Provider, MD  losartan (COZAAR) 25 MG tablet Take 25 mg by mouth daily.   Yes Historical Provider, MD  magnesium hydroxide (MILK OF MAGNESIA) 400 MG/5ML suspension Take 10 mLs by mouth daily as needed for mild constipation.    Yes Historical Provider, MD  metFORMIN (GLUCOPHAGE) 500 MG tablet Take 500 mg by mouth daily.   Yes Historical Provider, MD  metoprolol succinate (TOPROL-XL) 25 MG 24 hr tablet Take 25 mg by mouth daily.   Yes Historical Provider, MD  nitroGLYCERIN (NITROSTAT) 0.4 MG SL tablet Place 1 tablet (0.4 mg total) under the tongue every 5 (five) minutes as needed for chest pain. 01/24/13  Yes Minna Merritts, MD  ondansetron (ZOFRAN) 4 MG tablet Take 4 mg by mouth 2 (two) times daily.   Yes Historical Provider, MD  ondansetron (ZOFRAN) 4 MG tablet Take 1 tablet (4 mg total) by mouth every 8 (  eight) hours as needed for nausea or vomiting. 09/15/16  Yes Nance Pear, MD  ondansetron (ZOFRAN-ODT) 4 MG disintegrating tablet Take 4 mg by mouth every 8 (eight) hours as  needed for nausea or vomiting.   Yes Historical Provider, MD  pantoprazole (PROTONIX) 40 MG tablet Take 40 mg by mouth 2 (two) times daily.     Yes Historical Provider, MD  Potassium Chloride ER 20 MEQ TBCR Take 20 mEq by mouth 3 (three) times daily.    Yes Historical Provider, MD  ranitidine (ZANTAC) 150 MG tablet Take 150 mg by mouth 2 (two) times daily.   Yes Historical Provider, MD  sennosides-docusate sodium (SENOKOT-S) 8.6-50 MG tablet Take 2 tablets by mouth daily.   Yes Historical Provider, MD  sucralfate (CARAFATE) 1 g tablet Take 1 g by mouth 4 (four) times daily.   Yes Historical Provider, MD  traMADol (ULTRAM) 50 MG tablet Take 1 tablet (50 mg total) by mouth every 6 (six) hours as needed. 04/23/16 04/23/17 Yes Earleen Newport, MD   Allergies  Allergen Reactions  . Citalopram Other (See Comments)    Reaction:  Altered mental status   . Cymbalta [Duloxetine Hcl] Other (See Comments)    Reaction:  Sedative for pt   . Imipramine Other (See Comments)    Reaction:  Unknown   . Proton Pump Inhibitors Other (See Comments)    Reaction:  Unknown   . Venlafaxine Nausea And Vomiting and Other (See Comments)    Reaction:  Dizziness    Review of Systems  Constitutional: Positive for activity change and fatigue.  Gastrointestinal: Positive for diarrhea.  Neurological: Positive for weakness.    Physical Exam  Constitutional: She appears ill.  Morbid obesity, generalized weakness  Cardiovascular: Normal rate, regular rhythm and normal heart sounds.   Pulmonary/Chest: She has decreased breath sounds.  Skin: Skin is warm and dry.    Vital Signs: BP 92/74 (BP Location: Right Arm)   Pulse 94   Temp 98.7 F (37.1 C) (Oral)   Resp (!) 22   Ht 5\' 4"  (1.626 m)   Wt 117 kg (258 lb)   SpO2 96%   BMI 44.29 kg/m  Pain Assessment: No/denies pain   Pain Score: Asleep   SpO2: SpO2: 96 % O2 Device:SpO2: 96 % O2 Flow Rate: .   IO: Intake/output summary:  Intake/Output Summary  (Last 24 hours) at 09/19/16 1043 Last data filed at 09/19/16 0800  Gross per 24 hour  Intake             1083 ml  Output                0 ml  Net             1083 ml    LBM: Last BM Date:  (pt unsure) Baseline Weight: Weight: 117 kg (258 lb) Most recent weight: Weight: 117 kg (258 lb)      Palliative Assessment/Data: 30 % at best   Discussed with Dr Anselm Jungling  Time In: 0950 Time Out: 1105 Time Total: 75 min Greater than 50%  of this time was spent counseling and coordinating care related to the above assessment and plan.  Signed by: Wadie Lessen, NP   Please contact Palliative Medicine Team phone at [phone] for questions and concerns.  For individual provider: See Shea Evans

## 2016-09-19 NOTE — Progress Notes (Addendum)
LCSW spoke with family member via phone: Michele Schultz regarding disposition.  Patient is from [facility] and paying out of pocket. She is Medicare Observation, unable to place in SNF at this time. Patient was faxed out for ALF however per family patient does not qualify for Medicaid as she makes too much money.  LCSW explored options with family about private pay and gave prices for facilities that patient would qualify for. At this time, there is no long term care insurance nor ability to pay.  Out of pocket max for patient is 2500.00. [facility] does have openings and LCSW forwarded email to family member, but out of pocket is 4300.00  LCSW discussed with family that patient is in observation status and would most likely be discharged today.  She will need to return to to [place] situation as there are no other options at this time.  Family member voiced understanding. List of bed offers will be sent along with ist of resources for patient if wanting to continue to pursue placement in community.  Referrals:  Nanine Means (has come by to see patient, updated on status and income/payor source) [facility] in [place] has also called, reports patient would be able to pay as 2400 is payment.  Will call niece and see if interested in [place].    Lane Hacker, MSW Clinical Social Work: [employer] Coverage for :  Michele Schultz  [phone]

## 2016-09-19 NOTE — Progress Notes (Signed)
[facility] NAME: Michele Schultz    MR#:  [id number]  DATE OF BIRTH:  11/25/1931  SUBJECTIVE:  CHIEF COMPLAINT:   Chief Complaint  Patient presents with  . Nausea  . Emesis    For 4-5 months have c/o nausea , diarrhea- came as worsening for 1 week.  Also have a fib and v tech on tele today.  She was seen by Dr. Donnella Sham in office in past- was advised to take PPI BID and Reglan, and suggested to be high risk for procedure, but - IF her problem does not resolve, MAY need EGD. so I will get GI re-involved tomorrow.  REVIEW OF SYSTEMS:  CONSTITUTIONAL: No fever, fatigue or weakness.  EYES: No blurred or double vision.  EARS, NOSE, AND THROAT: No tinnitus or ear pain.  RESPIRATORY: No cough, shortness of breath, wheezing or hemoptysis.  CARDIOVASCULAR: No chest pain, orthopnea, edema.  GASTROINTESTINAL: positive for nausea, vomiting, diarrhea or abdominal pain.  GENITOURINARY: No dysuria, hematuria.  ENDOCRINE: No polyuria, nocturia,  HEMATOLOGY: No anemia, easy bruising or bleeding SKIN: No rash or lesion. MUSCULOSKELETAL: No joint pain or arthritis.   NEUROLOGIC: No tingling, numbness, weakness.  PSYCHIATRY: No anxiety or depression.   ROS  DRUG ALLERGIES:   Allergies  Allergen Reactions  . Citalopram Other (See Comments)    Reaction:  Altered mental status   . Cymbalta [Duloxetine Hcl] Other (See Comments)    Reaction:  Sedative for pt   . Imipramine Other (See Comments)    Reaction:  Unknown   . Proton Pump Inhibitors Other (See Comments)    Reaction:  Unknown   . Venlafaxine Nausea And Vomiting and Other (See Comments)    Reaction:  Dizziness     VITALS:  Blood pressure (!) 103/49, pulse (!) 104, temperature 98.4 F (36.9 C), temperature source Oral, resp. rate 18, height 5\' 4"  (1.626 m), weight 117 kg (258 lb), SpO2 98 %.  PHYSICAL EXAMINATION:  GENERAL:  82 y.o.-year-old patient lying in the bed with no acute  distress.  EYES: Pupils equal, round, reactive to light and accommodation. No scleral icterus. Extraocular muscles intact.  HEENT: Head atraumatic, normocephalic. Oropharynx and nasopharynx clear.  NECK:  Supple, no jugular venous distention. No thyroid enlargement, no tenderness.  LUNGS: Normal breath sounds bilaterally, no wheezing, rales,rhonchi or crepitation. No use of accessory muscles of respiration.  CARDIOVASCULAR: S1, S2 fast and regular. No murmurs, rubs, or gallops.  ABDOMEN: Soft, nontender, distended. Bowel sounds present. No organomegaly or mass.  EXTREMITIES: No pedal edema, cyanosis, or clubbing.  NEUROLOGIC: Cranial nerves II through XII are intact. Muscle strength 5/5 in all extremities. Sensation intact. Gait not checked.  PSYCHIATRIC: The patient is alert and oriented x 3.  SKIN: No obvious rash, lesion, or ulcer.   Physical Exam LABORATORY PANEL:   CBC  Recent Labs Lab 09/18/16 0601  WBC 6.4  HGB 10.7*  HCT 31.8*  PLT 216   ------------------------------------------------------------------------------------------------------------------  Chemistries   Recent Labs Lab 09/17/16 1115 09/18/16 0601  NA 138 138  K 3.5 3.9  CL 102 107  CO2 26 25  GLUCOSE 208* 141*  BUN 11 8  CREATININE 0.75 0.52  CALCIUM 9.1 8.4*  MG 2.0  --   AST 35  --   ALT 20  --   ALKPHOS 77  --   BILITOT 0.7  --    ------------------------------------------------------------------------------------------------------------------  Cardiac Enzymes  Recent Labs Lab 09/17/16 2337 09/18/16  0601  TROPONINI <0.03 <0.03   ------------------------------------------------------------------------------------------------------------------  RADIOLOGY:  Ct Head Wo Contrast  Result Date: 09/17/2016 CLINICAL DATA:  Pt with N/V/D for over a week now. Pt states that the diarrhea has gotten worse since yesterday. Pt states that she has also been dizzy and taken multiple falls. Pt  denies LOC. EXAM: CT HEAD WITHOUT CONTRAST TECHNIQUE: Contiguous axial images were obtained from the base of the skull through the vertex without intravenous contrast. COMPARISON:  CT head dated 06/23/2016 FINDINGS: Brain: Mild generalized age related parenchymal atrophy with commensurate dilatation of the ventricles and sulci. Chronic small vessel ischemic changes within the bilateral periventricular white matter. There is no mass, hemorrhage, edema or other evidence of acute parenchymal abnormality. No extra-axial hemorrhage. Ventricles are stable in size and configuration. Vascular: There are chronic calcified atherosclerotic changes of the large vessels at the skull base. No unexpected hyperdense vessel. Skull: No acute or suspicious osseous finding. Sinuses/Orbits: No acute finding. Other: None. IMPRESSION: No acute findings.  No intracranial mass, hemorrhage or edema. Electronically Signed   By: Franki Cabot M.D.   On: 09/17/2016 15:07   Ct Abdomen Pelvis W Contrast  Result Date: 09/17/2016 CLINICAL DATA:  82 year old female with a history of nausea and vomiting for week EXAM: CT ABDOMEN AND PELVIS WITH CONTRAST TECHNIQUE: Multidetector CT imaging of the abdomen and pelvis was performed using the standard protocol following bolus administration of intravenous contrast. CONTRAST:  162mL ISOVUE-300 IOPAMIDOL (ISOVUE-300) INJECTION 61% COMPARISON:  06/23/2016 FINDINGS: Lower chest: Partially visualized pacing leads within the heart. Calcifications of mitral annulus. Calcifications of descending thoracic aorta. Atelectasis/ scarring of the bilateral lung bases. Hepatobiliary: Cranial caudal span of the right liver measures 18 cm. Diffusely decreased liver attenuation/ enhancement. Focal fatty sparing in the gallbladder fossa. Unremarkable appearance of gallbladder. Pancreas: Unremarkable. No pancreatic ductal dilatation or surrounding inflammatory changes. Spleen: Normal in size without focal abnormality.  Adrenals/Urinary Tract: Unremarkable appearance of the bilateral adrenal glands. Right kidney demonstrates nonobstructive nephrolithiasis with 1 mm - 2 mm stone in the hilum. No perinephric stranding. Unremarkable course of the right ureter. Left kidney without hydronephrosis. 2 mm -3 mm nonobstructive stone in the hilum of the left kidney. Unchanged low-density cystic structure of the superior medial cortex in the lower lateral cortex, too small to completely characterize. Unremarkable course of the left ureter. Unremarkable appearance of the urinary bladder. Urinary bladder evaluation somewhat limited by streak artifact from the right hip prosthesis. Stomach/Bowel: Unremarkable stomach. Lipoma of the duodenum, unchanged. Unremarkable small bowel without transition. Normal appendix. Fluid filled colon. No focal inflammatory changes. Minimal diverticular disease. No inflammatory changes in the mesenteric or free fluid. Vascular/Lymphatic: Calcifications of the abdominal aorta. No aneurysm or dissection. No periaortic fluid. Calcifications of the celiac artery origin and superior mesenteric artery origin. Calcifications at the origin of bilateral renal arteries. Calcifications at the origin of the inferior mesenteric artery. The mesenteric vessels and renal arteries are patent, and the degree of stenosis cannot be assessed. Iliac vasculature patent. Proximal femoral vasculature patent. Reproductive: Unremarkable uterus and adnexa Other: No abdominal wall hernia or abnormality. No abdominopelvic ascites. Musculoskeletal: No displaced fracture. Degenerative changes of the spine. Vacuum disc phenomenon most pronounced at the L4-L5 and L5-S1 level. No bony canal narrowing. Surgical changes of right hip arthroplasty. Degenerative changes of the left hip. IMPRESSION: No acute finding on the abdominal/ pelvis CT. Fluid filled colon, which is nonspecific, potentially representing enteritis/ colitis. Similar appearance of  bilateral nonobstructive nephrolithiasis. Hepatomegaly and steatosis Signed,  Dulcy Fanny. Earleen Newport, DO [facility] Electronically Signed   By: Corrie Mckusick D.O.   On: 09/17/2016 15:11   US Venous Img Lower Bilateral  Result Date: 09/17/2016 CLINICAL DATA:  82 year old female with bilateral lower extremity swelling EXAM: BILATERAL LOWER EXTREMITY VENOUS DOPPLER ULTRASOUND TECHNIQUE: Gray-scale sonography with graded compression, as well as color Doppler and duplex ultrasound were performed to evaluate the lower extremity deep venous systems from the level of the common femoral vein and including the common femoral, femoral, profunda femoral, popliteal and calf veins including the posterior tibial, peroneal and gastrocnemius veins when visible. The superficial great saphenous vein was also interrogated. Spectral Doppler was utilized to evaluate flow at rest and with distal augmentation maneuvers in the common femoral, femoral and popliteal veins. COMPARISON:  Left lower extremity ultrasound dated 11/05/2006 FINDINGS: RIGHT LOWER EXTREMITY Common Femoral Vein: No evidence of thrombus. Normal compressibility, respiratory phasicity and response to augmentation. Saphenofemoral Junction: No evidence of thrombus. Normal compressibility and flow on color Doppler imaging. Profunda Femoral Vein: No evidence of thrombus. Normal compressibility and flow on color Doppler imaging. Femoral Vein: No evidence of thrombus. Normal compressibility, respiratory phasicity and response to augmentation. Popliteal Vein: No evidence of thrombus. Normal compressibility, respiratory phasicity and response to augmentation. Calf Veins: No evidence of thrombus. Normal compressibility and flow on color Doppler imaging. Superficial Great Saphenous Vein: No evidence of thrombus. Normal compressibility and flow on color Doppler imaging. Venous Reflux:  None. Other Findings:  None. LEFT LOWER  EXTREMITY Common Femoral Vein: No evidence of thrombus. Normal compressibility, respiratory phasicity and response to augmentation. Saphenofemoral Junction: No evidence of thrombus. Normal compressibility and flow on color Doppler imaging. Profunda Femoral Vein: No evidence of thrombus. Normal compressibility and flow on color Doppler imaging. Femoral Vein: No evidence of thrombus. Normal compressibility, respiratory phasicity and response to augmentation. Popliteal Vein: No evidence of thrombus. Normal compressibility, respiratory phasicity and response to augmentation. Calf Veins: No evidence of thrombus. Normal compressibility and flow on color Doppler imaging. Superficial Great Saphenous Vein: No evidence of thrombus. Normal compressibility and flow on color Doppler imaging. Venous Reflux:  None. Other Findings:  None. IMPRESSION: No evidence of deep venous thrombosis in the lower extremities. Electronically Signed   By: Anner Crete M.D.   On: 09/17/2016 21:00    ASSESSMENT AND PLAN:   Active Problems:   Diarrhea   Dehydration   Metabolic acidosis  #1. Diarrhea, seems to be chronic, due to metformin?,    gastrointestinal panel negative to rule out bacterial infection, C. difficile was negative, continue supportive therapy,   CT of abdomen and pelvis was unremarkable   She saw Dr. Donnella Sham in office in past - suggested PPI BID and reglan, and if no help then may consider EGD.   I will wait for Dr. Tiffany Kocher for tomorrow morning, she may go for EGD- if he agrees with that. #2. Intermittent nausea and vomiting,   clear liquid diet, however, she refused, requested, regular diet,   supportive care for nausea. #3. Lactic acidosis, suspend metformin, continue IV fluids, follow lactic acid level #4. Bilateral lower extremity swelling , negatvie ultrasound of the lower extremities to rule out DVT   #5. Diabetes mellitus type 2, continue sliding scale insulin, 7.7- hemoglobin A1c #6 A fib with RVR-  have Hx of CHF and PPM    Cardiology consult.   Magnesium normal.   Increased metoprolol dose. #7 generalized weakness   PT eval.   All  the records are reviewed and case discussed with Care Management/Social Workerr. Management plans discussed with the patient, family and they are in agreement.  CODE STATUS: full.  TOTAL TIME TAKING CARE OF THIS PATIENT: 35 minutes.    POSSIBLE D/C IN 1-2 DAYS, DEPENDING ON CLINICAL CONDITION.   Vaughan Basta M.D on 09/19/2016   Between 7am to 6pm - Pager - [phone]  After 6pm go to [email or web address] - password [email or web address] [facility]  Office  [phone]  CC: Primary care physician; [name], MD  Note: This dictation was prepared with Dragon dictation along with smaller phrase technology. Any transcriptional errors that result from this process are unintentional.

## 2016-09-19 NOTE — Care Management (Signed)
PPD not ordered at this time. I checked with pharmacy to see if she'd received it in past visits. TB/PPD has been administered in the past 06/24/16 and 03/14/16. She does not need another PPD this year it seems.

## 2016-09-20 DIAGNOSIS — E860 Dehydration: Secondary | ICD-10-CM | POA: Diagnosis not present

## 2016-09-20 DIAGNOSIS — I471 Supraventricular tachycardia: Secondary | ICD-10-CM | POA: Diagnosis not present

## 2016-09-20 LAB — BASIC METABOLIC PANEL
Anion gap: 10 (ref 5–15)
BUN: 11 mg/dL (ref 6–20)
CHLORIDE: 105 mmol/L (ref 101–111)
CO2: 25 mmol/L (ref 22–32)
Calcium: 9.2 mg/dL (ref 8.9–10.3)
Creatinine, Ser: 0.65 mg/dL (ref 0.44–1.00)
GFR calc non Af Amer: >60 mL/min (ref >60)
Glucose, Bld: 155 mg/dL — ABNORMAL HIGH (ref 65–99)
POTASSIUM: 3.3 mmol/L — AB (ref 3.5–5.1)
SODIUM: 140 mmol/L (ref 135–145)

## 2016-09-20 LAB — CBC
HEMATOCRIT: 36.1 % (ref 35.0–47.0)
HEMOGLOBIN: 12.1 g/dL (ref 12.0–16.0)
MCH: 28.1 pg (ref 26.0–34.0)
MCHC: 33.6 g/dL (ref 32.0–36.0)
MCV: 83.5 fL (ref 80.0–100.0)
Platelets: 275 10*3/uL (ref 150–440)
RBC: 4.33 MIL/uL (ref 3.80–5.20)
RDW: 17 % — ABNORMAL HIGH (ref 11.5–14.5)
WBC: 8.1 10*3/uL (ref 3.6–11.0)

## 2016-09-20 LAB — MAGNESIUM: MAGNESIUM: 2 mg/dL (ref 1.7–2.4)

## 2016-09-20 LAB — GLUCOSE, CAPILLARY
GLUCOSE-CAPILLARY: 128 mg/dL — AB (ref 65–99)
GLUCOSE-CAPILLARY: 175 mg/dL — AB (ref 65–99)
GLUCOSE-CAPILLARY: 143 mg/dL — AB (ref 65–99)
Glucose-Capillary: 114 mg/dL — ABNORMAL HIGH (ref 65–99)

## 2016-09-20 LAB — TSH: TSH: 1.644 u[IU]/mL (ref 0.350–4.500)

## 2016-09-20 MED ORDER — OXYCODONE HCL 5 MG PO TABS: 5.0000 mg | ORAL_TABLET | Freq: Four times a day (QID) | ORAL | Status: DC | PRN

## 2016-09-20 MED ORDER — DILTIAZEM HCL 30 MG PO TABS
30.0000 mg | ORAL_TABLET | Freq: Four times a day (QID) | ORAL | Status: DC
  Administered 2016-09-20 – 2016-09-22 (×9): 30 mg via ORAL
  Filled 2016-09-20 (×9): qty 1

## 2016-09-20 NOTE — Progress Notes (Signed)
Physical Therapy Treatment Patient Details Name: Michele Schultz MRN: [id number] DOB: December 05, 1930 Today's Date: 09/20/2016    History of Present Illness Pt admitted for diarrhea. Pt with complaints of nausea/weakness after returning home from SNF. Pt lives at [facility] at this time. Pt with history of pacemaker, aortic stenosis, anemia, and GERD. Pt also admits recent fall at home    PT Comments    Pt. Supine in bed upon arrival, agreeable to activity but perseverating on eating throughout session. Pt. Reminded of NPO status throughout session. Pt. Oriented to self and date however demonstrates limited recall of orientation to place following verbal reminder. She was able to perform supine and sitting exercises for general AROM/strengthening. Bed mobility mod A, sit<>stand transfers min A with use of RW Pt. Able to ambulate approx. 97ft. To transfer to chair demonstrating decreased step length/height and reliance on RW for safety, pt. Requires vc's to keep hands on RW throughout gait distance. She also demonstrates decreased tolerance to activity requiring rest breaks during exercises and demonstrating fatigue following short ambulation distance to chair. Would benefit from skilled PT to address above deficits and promote optimal return to PLOF Recommend SNF placement upon d/c to follow up with further skilled PT needs.   Follow Up Recommendations  SNF     Equipment Recommendations       Recommendations for Other Services       Precautions / Restrictions Precautions Precautions: Fall Restrictions Weight Bearing Restrictions: No    Mobility  Bed Mobility Overal bed mobility: Needs Assistance Bed Mobility: Supine to Sit     Supine to sit: Mod assist     General bed mobility comments: Pt. requires HOB elevated and bedrail use for UE assist, pt. able to assist with BLE movements, requires assist for trunk positioning.  Transfers Overall transfer level: Needs  assistance Equipment used: Rolling walker (2 wheeled) Transfers: Sit to/from Stand Sit to Stand: Min assist         General transfer comment: Pt. able to push from seating surface, movement transitions are effortful.  Ambulation/Gait Ambulation/Gait assistance: Min assist Ambulation Distance (Feet): 2 Feet Assistive device: Rolling walker (2 wheeled)       General Gait Details: Pt. able to perform approx. 55ft. of gait to transfer from bed to chair, demonstrates short step length/height B, step to pattern. Pt. requires vc's to hold on to RW throughout ambulation distance, tendency to reach for chair.    Stairs            Wheelchair Mobility    Modified Rankin (Stroke Patients Only)       Balance Overall balance assessment: Needs assistance;History of Falls Sitting-balance support: Feet supported Sitting balance-Leahy Scale: Good Sitting balance - Comments: able to perform multiple exercises at EOB without physical assit.    Standing balance support: Bilateral upper extremity supported Standing balance-Leahy Scale: Fair                      Cognition Arousal/Alertness: Awake/alert Behavior During Therapy: WFL for tasks assessed/performed Overall Cognitive Status: Impaired/Different from baseline Area of Impairment: Orientation Orientation Level: Disoriented to (pt. asking if she is in [place] at the hospital)   Memory: Decreased short-term memory              Exercises Other Exercises Other Exercises: supine bed ex's; BLE x10 quad sets, heels slides. Sitting EOB; x10B UE shoulder horizontal ab/add, LAQ, marching, glut sets. Standing marching x10B at EOB  General Comments        Pertinent Vitals/Pain Pain Assessment: 0-10 Pain Score: 5  Pain Location: all over "i have fibromyalgia and arthritis"  Pain Intervention(s): Monitored during session    Home Living Family/patient expects to be discharged to:: Assisted living Living  Arrangements: Alone           Home Equipment: Bedside commode;Shower seat;Walker - 4 wheels;Wheelchair - manual Additional Comments: from [place]    Prior Function Level of Independence: Needs assistance  Gait / Transfers Assistance Needed: Patient reports utilizing rollator and wheelchair at home. ADL's / Homemaking Assistance Needed: Performed by nurse aide who visits 2 hours/day Comments: RW bed to BR only; unable to self propel in WC.    PT Goals (current goals can now be found in the care plan section) Acute Rehab PT Goals Patient Stated Goal: "to eat breakfast cuz I am hungry" PT Goal Formulation: With patient Time For Goal Achievement: 10/02/16 Potential to Achieve Goals: Good Additional Goals Additional Goal #1: Pt will be able to perform bed mobility/transfers with cga and RW in order to improve functional independence Progress towards PT goals: Progressing toward goals    Frequency    Min 2X/week      PT Plan      Co-evaluation             End of Session Equipment Utilized During Treatment: Gait belt Activity Tolerance: Patient tolerated treatment well Patient left: in chair;with call bell/phone within reach;with chair alarm set     Time: [id number] PT Time Calculation (min) (ACUTE ONLY): 23 min  Charges:                       G Codes:       Melanie Crazier, SPT  09/20/16,11:35 AM

## 2016-09-20 NOTE — Progress Notes (Signed)
Patient: Michele Schultz / Admit Date: 09/17/2016 / Date of Encounter: 09/20/2016, 7:31 AM   Subjective: No complaints this morning. No palpitations. No chest pain or SOB. No labs this morning. Some constipation.   Review of Systems: Review of Systems  Constitutional: Positive for malaise/fatigue. Negative for chills, diaphoresis, fever and weight loss.  HENT: Negative for congestion.   Eyes: Negative for discharge and redness.  Respiratory: Negative for cough, sputum production, shortness of breath and wheezing.   Cardiovascular: Negative for chest pain, palpitations, orthopnea, claudication, leg swelling and PND.  Gastrointestinal: Negative for abdominal pain, heartburn, nausea and vomiting.  Musculoskeletal: Negative for falls and myalgias.  Skin: Negative for rash.  Neurological: Positive for headaches. Negative for dizziness, tingling, tremors, sensory change, speech change, focal weakness, loss of consciousness and weakness.  Endo/Heme/Allergies: Does not bruise/bleed easily.  Psychiatric/Behavioral: Negative for substance abuse. The patient is not nervous/anxious.   All other systems reviewed and are negative.   Objective: Telemetry: currently NSR, 70's bpm, episodes of atrial tach and MAT in the evening of 10/17 and overnight into the 120-140 bpm range Physical Exam: Blood pressure 129/70, pulse 77, temperature 98.2 F (36.8 C), temperature source Oral, resp. rate 18, height 5\' 4"  (1.626 m), weight 260 lb 3.2 oz (118 kg), SpO2 96 %. Body mass index is 44.66 kg/m. General: Well developed, well nourished, in no acute distress. Head: Normocephalic, atraumatic, sclera non-icteric, no xanthomas, nares are without discharge. Neck: Negative for carotid bruits. JVP not elevated. Lungs: Clear bilaterally to auscultation without wheezes, rales, or rhonchi. Breathing is unlabored. Heart: RRR S1 S2 without murmurs, rubs, or gallops.  Abdomen: Obese, soft, non-tender, non-distended  with normoactive bowel sounds. No rebound/guarding. Extremities: No clubbing or cyanosis. No edema. Distal pedal pulses are 2+ and equal bilaterally. Neuro: Alert and oriented X 3. Moves all extremities spontaneously. Psych:  Responds to questions appropriately with a normal affect.   Intake/Output Summary (Last 24 hours) at 09/20/16 0731 Last data filed at 09/20/16 0650  Gross per 24 hour  Intake              720 ml  Output                2 ml  Net              718 ml    Inpatient Medications:  . acidophilus  1 capsule Oral BID  . aspirin EC  81 mg Oral Daily  . atorvastatin  80 mg Oral Daily  . budesonide (PULMICORT) nebulizer solution  0.25 mg Nebulization BID  . calcium-vitamin D  1 tablet Oral Daily  . cholecalciferol  1,000 Units Oral Daily  . cyanocobalamin  1,000 mcg Intramuscular Q30 days  . enoxaparin (LOVENOX) injection  40 mg Subcutaneous Q12H  . famotidine  20 mg Oral BID  . furosemide  20 mg Oral Daily  . gabapentin  300 mg Oral BID  . insulin aspart  0-5 Units Subcutaneous QHS  . insulin aspart  0-9 Units Subcutaneous TID WC  . insulin aspart  3 Units Subcutaneous TID WC  . lidocaine  1 patch Transdermal Q12H  . metoCLOPramide  5 mg Oral TID AC & HS  . metoprolol succinate  75 mg Oral Daily  . pantoprazole  40 mg Oral BID  . sodium chloride flush  3 mL Intravenous Q12H  . sucralfate  1 g Oral QID   Infusions:    Labs:  Recent Labs  09/17/16 1115  09/18/16 0601  NA 138 138  K 3.5 3.9  CL 102 107  CO2 26 25  GLUCOSE 208* 141*  BUN 11 8  CREATININE 0.75 0.52  CALCIUM 9.1 8.4*  MG 2.0  --     Recent Labs  09/17/16 1115  AST 35  ALT 20  ALKPHOS 77  BILITOT 0.7  PROT 7.2  ALBUMIN 3.6    Recent Labs  09/17/16 1115 09/18/16 0601  WBC 7.4 6.4  NEUTROABS 4.8  --   HGB 12.0 10.7*  HCT 36.3 31.8*  MCV 83.5 83.7  PLT 266 216    Recent Labs  09/17/16 1115 09/17/16 1832 09/17/16 2337 09/18/16 0601  CKTOTAL 46  --   --   --     TROPONINI <0.03 <0.03 <0.03 <0.03   Invalid input(s): POCBNP  Recent Labs  09/17/16 1115  HGBA1C 7.7*     Weights: Filed Weights   09/17/16 1112 09/20/16 0446  Weight: 258 lb (117 kg) 260 lb 3.2 oz (118 kg)     Radiology/Studies:  Ct Head Wo Contrast  Result Date: 09/17/2016 CLINICAL DATA:  Pt with N/V/D for over a week now. Pt states that the diarrhea has gotten worse since yesterday. Pt states that she has also been dizzy and taken multiple falls. Pt denies LOC. EXAM: CT HEAD WITHOUT CONTRAST TECHNIQUE: Contiguous axial images were obtained from the base of the skull through the vertex without intravenous contrast. COMPARISON:  CT head dated 06/23/2016 FINDINGS: Brain: Mild generalized age related parenchymal atrophy with commensurate dilatation of the ventricles and sulci. Chronic small vessel ischemic changes within the bilateral periventricular white matter. There is no mass, hemorrhage, edema or other evidence of acute parenchymal abnormality. No extra-axial hemorrhage. Ventricles are stable in size and configuration. Vascular: There are chronic calcified atherosclerotic changes of the large vessels at the skull base. No unexpected hyperdense vessel. Skull: No acute or suspicious osseous finding. Sinuses/Orbits: No acute finding. Other: None. IMPRESSION: No acute findings.  No intracranial mass, hemorrhage or edema. Electronically Signed   By: Franki Cabot M.D.   On: 09/17/2016 15:07   Ct Abdomen Pelvis W Contrast  Result Date: 09/17/2016 CLINICAL DATA:  82 year old female with a history of nausea and vomiting for week EXAM: CT ABDOMEN AND PELVIS WITH CONTRAST TECHNIQUE: Multidetector CT imaging of the abdomen and pelvis was performed using the standard protocol following bolus administration of intravenous contrast. CONTRAST:  152mL ISOVUE-300 IOPAMIDOL (ISOVUE-300) INJECTION 61% COMPARISON:  06/23/2016 FINDINGS: Lower chest: Partially visualized pacing leads within the heart.  Calcifications of mitral annulus. Calcifications of descending thoracic aorta. Atelectasis/ scarring of the bilateral lung bases. Hepatobiliary: Cranial caudal span of the right liver measures 18 cm. Diffusely decreased liver attenuation/ enhancement. Focal fatty sparing in the gallbladder fossa. Unremarkable appearance of gallbladder. Pancreas: Unremarkable. No pancreatic ductal dilatation or surrounding inflammatory changes. Spleen: Normal in size without focal abnormality. Adrenals/Urinary Tract: Unremarkable appearance of the bilateral adrenal glands. Right kidney demonstrates nonobstructive nephrolithiasis with 1 mm - 2 mm stone in the hilum. No perinephric stranding. Unremarkable course of the right ureter. Left kidney without hydronephrosis. 2 mm -3 mm nonobstructive stone in the hilum of the left kidney. Unchanged low-density cystic structure of the superior medial cortex in the lower lateral cortex, too small to completely characterize. Unremarkable course of the left ureter. Unremarkable appearance of the urinary bladder. Urinary bladder evaluation somewhat limited by streak artifact from the right hip prosthesis. Stomach/Bowel: Unremarkable stomach. Lipoma of the duodenum, unchanged. Unremarkable small  bowel without transition. Normal appendix. Fluid filled colon. No focal inflammatory changes. Minimal diverticular disease. No inflammatory changes in the mesenteric or free fluid. Vascular/Lymphatic: Calcifications of the abdominal aorta. No aneurysm or dissection. No periaortic fluid. Calcifications of the celiac artery origin and superior mesenteric artery origin. Calcifications at the origin of bilateral renal arteries. Calcifications at the origin of the inferior mesenteric artery. The mesenteric vessels and renal arteries are patent, and the degree of stenosis cannot be assessed. Iliac vasculature patent. Proximal femoral vasculature patent. Reproductive: Unremarkable uterus and adnexa Other: No  abdominal wall hernia or abnormality. No abdominopelvic ascites. Musculoskeletal: No displaced fracture. Degenerative changes of the spine. Vacuum disc phenomenon most pronounced at the L4-L5 and L5-S1 level. No bony canal narrowing. Surgical changes of right hip arthroplasty. Degenerative changes of the left hip. IMPRESSION: No acute finding on the abdominal/ pelvis CT. Fluid filled colon, which is nonspecific, potentially representing enteritis/ colitis. Similar appearance of bilateral nonobstructive nephrolithiasis. Hepatomegaly and steatosis Signed, Dulcy Fanny. Earleen Newport, DO [facility] Electronically Signed   By: Corrie Mckusick D.O.   On: 09/17/2016 15:11   US Venous Img Lower Bilateral  Result Date: 09/17/2016 CLINICAL DATA:  82 year old female with bilateral lower extremity swelling EXAM: BILATERAL LOWER EXTREMITY VENOUS DOPPLER ULTRASOUND TECHNIQUE: Gray-scale sonography with graded compression, as well as color Doppler and duplex ultrasound were performed to evaluate the lower extremity deep venous systems from the level of the common femoral vein and including the common femoral, femoral, profunda femoral, popliteal and calf veins including the posterior tibial, peroneal and gastrocnemius veins when visible. The superficial great saphenous vein was also interrogated. Spectral Doppler was utilized to evaluate flow at rest and with distal augmentation maneuvers in the common femoral, femoral and popliteal veins. COMPARISON:  Left lower extremity ultrasound dated 11/05/2006 FINDINGS: RIGHT LOWER EXTREMITY Common Femoral Vein: No evidence of thrombus. Normal compressibility, respiratory phasicity and response to augmentation. Saphenofemoral Junction: No evidence of thrombus. Normal compressibility and flow on color Doppler imaging. Profunda Femoral Vein: No evidence of thrombus. Normal compressibility and flow on color Doppler imaging. Femoral Vein: No  evidence of thrombus. Normal compressibility, respiratory phasicity and response to augmentation. Popliteal Vein: No evidence of thrombus. Normal compressibility, respiratory phasicity and response to augmentation. Calf Veins: No evidence of thrombus. Normal compressibility and flow on color Doppler imaging. Superficial Great Saphenous Vein: No evidence of thrombus. Normal compressibility and flow on color Doppler imaging. Venous Reflux:  None. Other Findings:  None. LEFT LOWER EXTREMITY Common Femoral Vein: No evidence of thrombus. Normal compressibility, respiratory phasicity and response to augmentation. Saphenofemoral Junction: No evidence of thrombus. Normal compressibility and flow on color Doppler imaging. Profunda Femoral Vein: No evidence of thrombus. Normal compressibility and flow on color Doppler imaging. Femoral Vein: No evidence of thrombus. Normal compressibility, respiratory phasicity and response to augmentation. Popliteal Vein: No evidence of thrombus. Normal compressibility, respiratory phasicity and response to augmentation. Calf Veins: No evidence of thrombus. Normal compressibility and flow on color Doppler imaging. Superficial Great Saphenous Vein: No evidence of thrombus. Normal compressibility and flow on color Doppler imaging. Venous Reflux:  None. Other Findings:  None. IMPRESSION: No evidence of deep venous thrombosis in the lower extremities. Electronically Signed   By: Anner Crete M.D.   On: 09/17/2016 21:00     Assessment and Plan  Active Problems:   Diarrhea   Dehydration   Metabolic acidosis   DNR (do not resuscitate)   Palliative care by specialist  Muscle weakness (generalized)   Atrial tachycardia ([place])    1. Nausea with chronic diarrhea: Now with constipation. Defer to IM. Nausea possibly 2/2 metformin vs diabetic gastroparesis. Per IM.  2. Paroxysmal atrial tachycardia/MAT: Currently in normal sinus rhythm with heart rate in the 70's bpm.  Asymptomatic.  Actually with more atrial ectopy overnight last night than compared to prior night. Will change Toprol to short-acting diltiazem in an effort to provide better rate control. Check labs.   3. Chronic diastolic CHF: She is relatively euvolemic at this time. BP stable. Toprol as above. Continue PO Lasix.  4. Hypertensive heart disease: BP stable.   Signed, Christell Faith, PA-C [place] Pager: [phone] 09/20/2016, 7:31 AM

## 2016-09-20 NOTE — Progress Notes (Signed)
New referral for Palliative NP to follow at [facility] received from [place]. Plan is for discharge today. [facility] referral notified. Thank you. Flo Shanks RN, BSN, [facility] [place], [facility] [phone]

## 2016-09-20 NOTE — Care Management (Signed)
EMS packet started. [facility] home health ready to resume patient care with home health. RNCM will continue to follow.

## 2016-09-20 NOTE — Evaluation (Signed)
Occupational Therapy Evaluation Patient Details Name: EBANY RAVINDRAN MRN: [id number] DOB: 04-27-1931 Today's Date: 09/20/2016    History of Present Illness Pt admitted for diarrhea. Pt with complaints of nausea/weakness after returning home from SNF. Pt lives at [facility] at this time. Pt with history of pacemaker, aortic stenosis, anemia, and GERD. Pt also admits recent fall at home   Clinical Impression   Pt is 82 year old female who presents to [facility] hospital with diarrhea and weakness.  She was at [facility] home and reports having an aid 2 hours a day to help with bathing and dressing skills. She was upset about not being able to eat breakfast due to pending GI test.  She was hungry and asking if she was getting the test or not.  NSG contacted and to purse with MD.  Pt currently requires mod to total assist for ADLs due to generalized weakness, decreased functional endurance and decreased awareness of AD to assist with ADLs. Pt would benefit from skilled OT services to increase independence in ADLs, education in energy conservation techniques and AD for ADLs and  recommendations for home modifications to increase safety and prevent falls.  Rec SNF or possible long term placement due to needing heavy assist for ADLs and poor endurance for tasks.      Follow Up Recommendations  SNF (or possible LTACH)    Equipment Recommendations       Recommendations for Other Services       Precautions / Restrictions Precautions Precautions: Fall Restrictions Weight Bearing Restrictions: No      Mobility Bed Mobility                  Transfers                      Balance                                            ADL Overall ADL's : Needs assistance/impaired Eating/Feeding: Moderate assistance;Set up   Grooming: Wash/dry hands;Wash/dry face;Oral care;Minimal assistance;Set up Grooming Details (indicate cue type and reason):  poor tolerance for hands to head for brushing hair due to poor endurance and weakness         Upper Body Dressing : Maximal assistance;Set up   Lower Body Dressing: Total assistance;Bed level                       Vision     Perception     Praxis      Pertinent Vitals/Pain Pain Assessment: No/denies pain     Hand Dominance Right   Extremity/Trunk Assessment Upper Extremity Assessment Upper Extremity Assessment: Generalized weakness   Lower Extremity Assessment Lower Extremity Assessment: Defer to PT evaluation       Communication Communication Communication: No difficulties   Cognition Arousal/Alertness: Awake/alert Behavior During Therapy: Impulsive;Anxious (upset about not being able to eat due to possible stomach test) Overall Cognitive Status: Impaired/Different from baseline Area of Impairment: Orientation Orientation Level: Disoriented to;Place   Memory: Decreased short-term memory             General Comments       Exercises       Shoulder Instructions      Home Living Family/patient expects to be discharged to:: Assisted living Living Arrangements: Alone  Home Equipment: Bedside commode;Shower seat;Walker - 4 wheels;Wheelchair - manual   Additional Comments: from [place]      Prior Functioning/Environment Level of Independence: Needs assistance  Gait / Transfers Assistance Needed: Patient reports utilizing rollator and wheelchair at home. ADL's / Homemaking Assistance Needed: Performed by nurse aide who visits 2 hours/day   Comments: RW bed to BR only; unable to self propel in WC.         OT Problem List: Decreased strength;Decreased range of motion;Decreased activity tolerance;Decreased knowledge of use of DME or AE;Obesity   OT Treatment/Interventions: Patient/family education;Self-care/ADL training;Balance training;Therapeutic exercise;Therapeutic activities;DME and/or AE  instruction    OT Goals(Current goals can be found in the care plan section) Acute Rehab OT Goals Patient Stated Goal: "to eat breakfast cuz I am hungry" OT Goal Formulation: With patient Time For Goal Achievement: 10/04/16 Potential to Achieve Goals: Fair ADL Goals Pt Will Perform Eating: with min assist;with adaptive utensils;bed level (using foam utensil holders) Pt Will Perform Grooming: with min assist;with adaptive equipment;sitting Pt Will Perform Upper Body Dressing: with mod assist;sitting Pt Will Perform Lower Body Dressing: with mod assist;sit to/from stand (using FWW and no LOB) Pt Will Transfer to Toilet: with mod assist;with +2 assist;bedside commode  OT Frequency: Min 1X/week   Barriers to D/C:            Co-evaluation              End of Session Nurse Communication:  (pt asking when she will know if she needs GI test because she is hungry and wants breakfast)  Activity Tolerance: Patient limited by fatigue Patient left: in bed;with call bell/phone within reach;with bed alarm set   Time: 0835-0900 OT Time Calculation (min): 25 min Charges:  OT General Charges $OT Visit: 1 Procedure OT Evaluation $OT Eval Moderate Complexity: 1 Procedure OT Treatments $Self Care/Home Management : 8-22 mins G-Codes: OT G-codes **NOT FOR INPATIENT CLASS** Functional Limitation: Self care Self Care Current Status [id number]): At least 80 percent but less than 100 percent impaired, limited or restricted Self Care Goal Status [id number]): At least 60 percent but less than 80 percent impaired, limited or restricted    Chrys Racer, OTR/L ascom [phone] 09/20/16, 9:37 AM

## 2016-09-21 DIAGNOSIS — E860 Dehydration: Secondary | ICD-10-CM | POA: Diagnosis not present

## 2016-09-21 LAB — GLUCOSE, CAPILLARY
GLUCOSE-CAPILLARY: 133 mg/dL — AB (ref 65–99)
GLUCOSE-CAPILLARY: 198 mg/dL — AB (ref 65–99)
Glucose-Capillary: 133 mg/dL — ABNORMAL HIGH (ref 65–99)
Glucose-Capillary: 144 mg/dL — ABNORMAL HIGH (ref 65–99)
Glucose-Capillary: 176 mg/dL — ABNORMAL HIGH (ref 65–99)

## 2016-09-21 MED ORDER — METOCLOPRAMIDE HCL 5 MG PO TABS: 5.0000 mg | ORAL_TABLET | Freq: Four times a day (QID) | ORAL | 0 refills | Status: DC | PRN

## 2016-09-21 MED ORDER — DILTIAZEM HCL ER COATED BEADS 120 MG PO CP24: 120.0000 mg | ORAL_CAPSULE | Freq: Every day | ORAL | 0 refills | Status: DC

## 2016-09-21 MED ORDER — MAGNESIUM HYDROXIDE 400 MG/5ML PO SUSP
30.0000 mL | Freq: Once | ORAL | Status: DC
  Administered 2016-09-21: 30 mL via ORAL

## 2016-09-21 MED ORDER — ONDANSETRON 4 MG PO TBDP
4.0000 mg | ORAL_TABLET | Freq: Four times a day (QID) | ORAL | Status: DC
  Administered 2016-09-21 – 2016-09-22 (×6): 4 mg via ORAL
  Filled 2016-09-21 (×6): qty 1

## 2016-09-21 MED ORDER — MAGNESIUM HYDROXIDE 400 MG/5ML PO SUSP
30.0000 mL | Freq: Two times a day (BID) | ORAL | Status: DC
  Administered 2016-09-21: 30 mL via ORAL
  Filled 2016-09-21: qty 30

## 2016-09-21 MED ORDER — ONDANSETRON 4 MG PO TBDP
4.0000 mg | ORAL_TABLET | Freq: Four times a day (QID) | ORAL | 0 refills | Status: DC
Start: 2016-09-21 — End: 2016-10-07

## 2016-09-21 NOTE — Progress Notes (Signed)
Spoke with [facility] Dy case Freight forwarder, she has set up home health post discharge for patient. Patient will be going by EMS, EMS called for transport.  Deri Fuelling, RN

## 2016-09-21 NOTE — Progress Notes (Signed)
Patient refused transport to EMS. MD and case manager made aware. No new orders at this time.  Deri Fuelling, RN

## 2016-09-21 NOTE — Progress Notes (Signed)
MD Judd Gaudier returned page, and notified about tele call. Pt also requesting Milk of Magnesium. 1X dose 30 mg order received. Order placed.

## 2016-09-21 NOTE — Discharge Summary (Addendum)
[facility] at [place] NAME: Michele Schultz    MR#:  [id number]  DATE OF BIRTH:  December 06, 1930  DATE OF ADMISSION:  09/17/2016 ADMITTING PHYSICIAN: Theodoro Grist, MD  DATE OF DISCHARGE: 09/21/2016  PRIMARY CARE PHYSICIAN: [name], MD    ADMISSION DIAGNOSIS:  Dehydration [E86.0] Swelling [R60.9] Gastroenteritis [K52.9] Tachycardia [R00.0] Elevated lactic acid level [R79.89]  DISCHARGE DIAGNOSIS:  Active Problems:   Diarrhea   Dehydration   Metabolic acidosis   DNR (do not resuscitate)   Palliative care by specialist   Muscle weakness (generalized)   Atrial tachycardia ([place])   SECONDARY DIAGNOSIS:   Past Medical History:  Diagnosis Date  . Anemia   . Aortic stenosis, severe    a. s/p Magna Ease pericardial tissue valve size 21 mm replacement in 10/2011 for severe AS 11/12; b. echo 07/2015: EF 55-60% mod concentric LVH, GR1DD, LA mildly dilated, PASP 45 mm Hg  . Atrial tachycardia, paroxysmal (HCC)    a. with rate related LBBB.  . Cardiac pacemaker -st Judes    11/12  . Chronic diastolic heart failure ([place])    a. echo 2014: EF 55-60%, no RWMA, GR1DD, PASP 47 mm Hg; b. echo 07/2015: EF 55-60% mod concentric LVH, GR1DD, LA mildly dilated, PASP 45 mm Hg  . Complete heart block [facility])    a. s/p St Jude PPM 10/2011 (Ser # [id number]).  . Coronary artery disease    a. s/p 2v CABG 11/12 (VG-LAD, VG-OM1); b. Lexiscan 08/2015: low risk, no ischemia, EF 55-65%.  . Enthesopathy of hip region   . Esophageal reflux    followed by Dr.Seigal. stabilized with a combination of Nexium and Zantac  . Fibromyalgia   . HLD (hyperlipidemia)   . Hypertensive heart disease   . Knee joint replacement by other means   . Morbid obesity ([place])   . Neuralgia, neuritis, and radiculitis, unspecified   . Neuropathy ([place])   . Onychia and paronychia of toe   . Osteoarthrosis, unspecified whether generalized or localized, pelvic region and thigh     mainly in her back and knees  . PAF (paroxysmal atrial fibrillation) ([place])    a. brief episodes of AF previously noted on device interrogations.  [facility] RAD (reactive airway disease)    a. chronic SOB  . Spinal stenosis   . Stress incontinence, female    followed by Dr.Cope  . Type II or unspecified type diabetes mellitus without mention of complication, not stated as uncontrolled    a. pt. reports that she is borderline   . Wide-complex tachycardia ([place])    a. Noted 4/10 - brief episode in ED. Noted again 4/21 in clinic->felt most likely to be atrial tach.    HOSPITAL COURSE:   #1. Diarrhea, seems to be chronic, due to metformin?,    gastrointestinal panel negative to rule out bacterial infection, C. difficile was negative, continue supportive therapy,   CT of abdomen and pelvis was unremarkable   She saw Dr. Donnella Sham in office in past - suggested PPI BID and reglan, and if no help then may consider EGD.   I spoke to Dr. Vira Agar- he suggested to start her on scheduled dose of Zofran before each meal.   Patient is feeling better in hospital and did not had any vomiting but just had some nausea, and did not had diarrhea. #2. Intermittent nausea and vomiting,   clear liquid diet, however, she refused, requested, regular diet,   supportive care  for nausea.   these are chronic symptoms, and overall feels little improvement with therapy in hospital without any episodes of nausea. #3. Lactic acidosis, suspend metformin, continue IV fluids, follow lactic acid level #4. Bilateral lower extremity swelling , negatvie ultrasound of the lower extremities to rule out DVT  #5. Diabetes mellitus type 2, continue sliding scale insulin, 7.7- hemoglobin A1c #6 A fib with RVR- have Hx of CHF and PPM    Cardiology consult.   Magnesium normal.   Increased metoprolol dose.   Now suggested to change to oral Cardizem. #7 generalized weakness   PT eval.   Suggested SNF. As she has already used up all her  days for rehabilitation, there is no clear source of payment for her rehabilitation placement, she does not want to pay out of pocket for that.   Case manager will arrange for home health at home. Medically stable for discharge.   DISCHARGE CONDITIONS:   Stable  CONSULTS OBTAINED:  Treatment Team:  San Jetty, MD Manya Silvas, MD  DRUG ALLERGIES:   Allergies  Allergen Reactions  . Citalopram Other (See Comments)    Reaction:  Altered mental status   . Cymbalta [Duloxetine Hcl] Other (See Comments)    Reaction:  Sedative for pt   . Imipramine Other (See Comments)    Reaction:  Unknown   . Proton Pump Inhibitors Other (See Comments)    Reaction:  Unknown   . Venlafaxine Nausea And Vomiting and Other (See Comments)    Reaction:  Dizziness     DISCHARGE MEDICATIONS:   Current Discharge Medication List    START taking these medications   Details  diltiazem (CARDIZEM CD) 120 MG 24 hr capsule Take 1 capsule (120 mg total) by mouth daily. Qty: 30 capsule, Refills: 0    metoCLOPramide (REGLAN) 5 MG tablet Take 1 tablet (5 mg total) by mouth every 6 (six) hours as needed for nausea or vomiting. Qty: 50 tablet, Refills: 0      CONTINUE these medications which have CHANGED   Details  ondansetron (ZOFRAN-ODT) 4 MG disintegrating tablet Take 1 tablet (4 mg total) by mouth 4 (four) times daily. Qty: 100 tablet, Refills: 0      CONTINUE these medications which have NOT CHANGED   Details  acetaminophen (TYLENOL) 325 MG tablet Take 650 mg by mouth 3 (three) times daily as needed.    acidophilus (RISAQUAD) CAPS capsule Take 1 capsule by mouth 2 (two) times daily. Qty: 1 capsule, Refills: 0    aspirin EC 81 MG tablet Take 81 mg by mouth daily.    atorvastatin (LIPITOR) 80 MG tablet Take 80 mg by mouth daily.    beclomethasone (QVAR) 80 MCG/ACT inhaler Inhale 1 puff into the lungs 2 (two) times daily.    Calcium Carbonate-Vitamin D 600-400 MG-UNIT tablet Take 1 tablet  by mouth daily.    Cholecalciferol 1000 units capsule Take 1,000 Units by mouth daily.    cyanocobalamin (,VITAMIN B-12,) 1000 MCG/ML injection Inject 1,000 mcg into the muscle every 30 (thirty) days. On the 16th of each month    furosemide (LASIX) 20 MG tablet Take 1 tablet (20 mg total) by mouth every other day. Qty: 30 tablet, Refills: 6    gabapentin (NEURONTIN) 300 MG capsule Take 300 mg by mouth 2 (two) times daily.    HYDROcodone-acetaminophen (NORCO) 10-325 MG tablet Take 1 tablet by mouth every 4 (four) hours as needed for moderate pain. Reported on 05/30/2016 Qty: 30 tablet, Refills:  0    ipratropium-albuterol (DUONEB) 0.5-2.5 (3) MG/3ML SOLN Take 3 mLs by nebulization every 6 (six) hours as needed.    lidocaine (LIDODERM) 5 % Place 1 patch onto the skin every 12 (twelve) hours. Remove & Discard patch within 12 hours or as directed by MD    magnesium hydroxide (MILK OF MAGNESIA) 400 MG/5ML suspension Take 10 mLs by mouth daily as needed for mild constipation.     metFORMIN (GLUCOPHAGE) 500 MG tablet Take 500 mg by mouth daily.    nitroGLYCERIN (NITROSTAT) 0.4 MG SL tablet Place 1 tablet (0.4 mg total) under the tongue every 5 (five) minutes as needed for chest pain. Qty: 90 tablet, Refills: 3    pantoprazole (PROTONIX) 40 MG tablet Take 40 mg by mouth 2 (two) times daily.      Potassium Chloride ER 20 MEQ TBCR Take 20 mEq by mouth 3 (three) times daily.     ranitidine (ZANTAC) 150 MG tablet Take 150 mg by mouth 2 (two) times daily.    sennosides-docusate sodium (SENOKOT-S) 8.6-50 MG tablet Take 2 tablets by mouth daily.    sucralfate (CARAFATE) 1 g tablet Take 1 g by mouth 4 (four) times daily.    traMADol (ULTRAM) 50 MG tablet Take 1 tablet (50 mg total) by mouth every 6 (six) hours as needed. Qty: 20 tablet, Refills: 0      STOP taking these medications     amLODipine (NORVASC) 5 MG tablet      losartan (COZAAR) 25 MG tablet      metoprolol succinate  (TOPROL-XL) 25 MG 24 hr tablet      ondansetron (ZOFRAN) 4 MG tablet      ondansetron (ZOFRAN) 4 MG tablet          DISCHARGE INSTRUCTIONS:    Follow with primary care doctor and his GI doctor as needed.  If you experience worsening of your admission symptoms, develop shortness of breath, life threatening emergency, suicidal or homicidal thoughts you must seek medical attention immediately by calling 911 or calling your MD immediately  if symptoms less severe.  You Must read complete instructions/literature along with all the possible adverse reactions/side effects for all the Medicines you take and that have been prescribed to you. Take any new Medicines after you have completely understood and accept all the possible adverse reactions/side effects.   Please note  You were cared for by a hospitalist during your hospital stay. If you have any questions about your discharge medications or the care you received while you were in the hospital after you are discharged, you can call the unit and asked to speak with the hospitalist on call if the hospitalist that took care of you is not available. Once you are discharged, your primary care physician will handle any further medical issues. Please note that NO REFILLS for any discharge medications will be authorized once you are discharged, as it is imperative that you return to your primary care physician (or establish a relationship with a primary care physician if you do not have one) for your aftercare needs so that they can reassess your need for medications and monitor your lab values.    Today   CHIEF COMPLAINT:   Chief Complaint  Patient presents with  . Nausea  . Emesis    HISTORY OF PRESENT ILLNESS:  Michele Schultz  is a 82 y.o. female with a known history of anemia, severe aortic stenosis, chronic diastolic CHF, complete heart block, status post permanent pacemaker,  coronary artery disease, gastroesophageal reflux disease  disease, hyperlipidemia, morbid obesity, who presents to the hospital with complaints of falls, weakness, diarrhea. When the patient, she was doing well up until a few months ago when she fell down and hurt her leg, she was sent to rehabilitation facility for 1-2 months, she came up from rehabilitation facility, however, feels that she is still very weak and not doing too well. She's been also having intermittent episodes of nausea and vomiting, diarrhea, on-and-off for the past 4-5 months, especially worse over the past one week. Patient  admits of colonoscopy years ago. Due to significant weakness, she presented to emergency room for further evaluation and treatment, hospitalist services were contacted for admission, since patient was noted to be tachycardic and had elevated lactic acid level.  VITAL SIGNS:  Blood pressure (!) 149/48, pulse 94, temperature 98.3 F (36.8 C), temperature source Oral, resp. rate 18, height 5\' 4"  (1.626 m), weight 121.2 kg (267 lb 4.8 oz), SpO2 99 %.  I/O:   Intake/Output Summary (Last 24 hours) at 09/21/16 1651 Last data filed at 09/21/16 1315  Gross per 24 hour  Intake              483 ml  Output                0 ml  Net              483 ml    PHYSICAL EXAMINATION:   GENERAL:  82 y.o.-year-old patient lying in the bed with no acute distress.  EYES: Pupils equal, round, reactive to light and accommodation. No scleral icterus. Extraocular muscles intact.  HEENT: Head atraumatic, normocephalic. Oropharynx and nasopharynx clear.  NECK:  Supple, no jugular venous distention. No thyroid enlargement, no tenderness.  LUNGS: Normal breath sounds bilaterally, no wheezing, rales,rhonchi or crepitation. No use of accessory muscles of respiration.  CARDIOVASCULAR: S1, S2 fast and regular. No murmurs, rubs, or gallops.  ABDOMEN: Soft, nontender, distended. Bowel sounds present. No organomegaly or mass.  EXTREMITIES: No pedal edema, cyanosis, or clubbing.  NEUROLOGIC:  Cranial nerves II through XII are intact. Muscle strength 3-4/5 in all extremities. Sensation intact. Gait not checked.  PSYCHIATRIC: The patient is alert and oriented x 3.  SKIN: No obvious rash, lesion, or ulcer.    DATA REVIEW:   CBC  Recent Labs Lab 09/20/16 1101  WBC 8.1  HGB 12.1  HCT 36.1  PLT 275    Chemistries   Recent Labs Lab 09/17/16 1115  09/20/16 1101  NA 138  < > 140  K 3.5  < > 3.3*  CL 102  < > 105  CO2 26  < > 25  GLUCOSE 208*  < > 155*  BUN 11  < > 11  CREATININE 0.75  < > 0.65  CALCIUM 9.1  < > 9.2  MG 2.0  --  2.0  AST 35  --   --   ALT 20  --   --   ALKPHOS 77  --   --   BILITOT 0.7  --   --   < > = values in this interval not displayed.  Cardiac Enzymes  Recent Labs Lab 09/18/16 0601  TROPONINI <0.03    Microbiology Results  Results for orders placed or performed during the hospital encounter of 09/17/16  C difficile quick scan w PCR reflex     Status: None   Collection Time: 09/17/16 12:28 PM  Result Value Ref Range Status  C Diff antigen NEGATIVE NEGATIVE Final   C Diff toxin NEGATIVE NEGATIVE Final   C Diff interpretation No C. difficile detected.  Final  Gastrointestinal Panel by PCR , Stool     Status: None   Collection Time: 09/17/16 12:28 PM  Result Value Ref Range Status   Campylobacter species NOT DETECTED NOT DETECTED Final   Plesimonas shigelloides NOT DETECTED NOT DETECTED Final   Salmonella species NOT DETECTED NOT DETECTED Final   Yersinia enterocolitica NOT DETECTED NOT DETECTED Final   Vibrio species NOT DETECTED NOT DETECTED Final   Vibrio cholerae NOT DETECTED NOT DETECTED Final   Enteroaggregative E coli (EAEC) NOT DETECTED NOT DETECTED Final   Enteropathogenic E coli (EPEC) NOT DETECTED NOT DETECTED Final   Enterotoxigenic E coli (ETEC) NOT DETECTED NOT DETECTED Final   Shiga like toxin producing E coli (STEC) NOT DETECTED NOT DETECTED Final   Shigella/Enteroinvasive E coli (EIEC) NOT DETECTED NOT  DETECTED Final   Cryptosporidium NOT DETECTED NOT DETECTED Final   Cyclospora cayetanensis NOT DETECTED NOT DETECTED Final   Entamoeba histolytica NOT DETECTED NOT DETECTED Final   Giardia lamblia NOT DETECTED NOT DETECTED Final   Adenovirus F40/41 NOT DETECTED NOT DETECTED Final   Astrovirus NOT DETECTED NOT DETECTED Final   Norovirus GI/GII NOT DETECTED NOT DETECTED Final   Rotavirus A NOT DETECTED NOT DETECTED Final   Sapovirus (I, II, IV, and V) NOT DETECTED NOT DETECTED Final    RADIOLOGY:  No results found.  EKG:   Orders placed or performed during the hospital encounter of 09/17/16  . EKG 12-Lead  . EKG 12-Lead  . EKG 12-Lead  . EKG 12-Lead      Management plans discussed with the patient, family and they are in agreement.  CODE STATUS: DNR    Code Status Orders        Start     Ordered   09/19/16 1052  Do not attempt resuscitation (DNR)  Continuous    Question Answer Comment  In the event of cardiac or respiratory ARREST Do not call a "code blue"   In the event of cardiac or respiratory ARREST Do not perform Intubation, CPR, defibrillation or ACLS   In the event of cardiac or respiratory ARREST Use medication by any route, position, wound care, and other measures to relive pain and suffering. May use oxygen, suction and manual treatment of airway obstruction as needed for comfort.      09/19/16 1052    Code Status History    Date Active Date Inactive Code Status Order ID Comments User Context   09/17/2016  5:57 PM 09/19/2016 10:52 AM Full Code [id number]  Theodoro Grist, MD Inpatient   06/23/2016  8:20 PM 06/26/2016  1:26 PM Full Code [id number]  Gladstone Lighter, MD Inpatient   06/16/2016  8:35 PM 06/21/2016 10:09 PM Full Code [id number]  Lytle Butte, MD ED   03/13/2016  8:15 PM 03/14/2016  9:02 PM Full Code [id number]  Henreitta Leber, MD Inpatient   09/13/2015  6:51 PM 09/18/2015  7:31 PM Full Code [id number]  Dustin Flock, MD Inpatient   10/20/2011  1:40 PM  10/22/2011  1:27 PM Full Code [id number]  Tiajuana Amass, RN Inpatient      TOTAL TIME TAKING CARE OF THIS PATIENT: 35 minutes.    Vaughan Basta M.D on 09/21/2016 at 4:51 PM  Between 7am to 6pm - Pager - [phone]  After 6pm go to [email or web address] - [facility]  Office  [phone]  CC: Primary care physician; [name], MD   Note: This dictation was prepared with Dragon dictation along with smaller phrase technology. Any transcriptional errors that result from this process are unintentional.

## 2016-09-21 NOTE — Progress Notes (Signed)
Telemetry called to notify that pt had intermediate bundle branch block (BBB). MD Anselm Jungling paged. Nurse waiting on call back.

## 2016-09-21 NOTE — Consult Note (Signed)
I came to see this patient and she was scheduled to go to nursing home and her nurse reported that the patient did not have any diarrhea, constipation or complaints of abd pain so I will not do the consult.  She was seen by Dr, Epimenio Foot

## 2016-09-21 NOTE — Progress Notes (Signed)
[place] at [place] NAME: Michele Schultz    MR#:  [id number]  DATE OF BIRTH:  08/01/1931  SUBJECTIVE:  CHIEF COMPLAINT:   Chief Complaint  Patient presents with  . Nausea  . Emesis    For 4-5 months have c/o nausea , diarrhea- came as worsening for 1 week.  Also have a fib and v tech on tele today.  She was seen by Dr. Donnella Sham in office in past- was advised to take PPI BID and Reglan, and suggested to be high risk for procedure, but - IF her problem does not resolve, MAY need EGD. I spoke to Dr. Tiffany Kocher about her need for EGD and kept her nothing by mouth. The patient told me she felt little better in last 1-2 days with the diet we offered here. She does not have any more diarrhea.  REVIEW OF SYSTEMS:  CONSTITUTIONAL: No fever, fatigue or weakness.  EYES: No blurred or double vision.  EARS, NOSE, AND THROAT: No tinnitus or ear pain.  RESPIRATORY: No cough, shortness of breath, wheezing or hemoptysis.  CARDIOVASCULAR: No chest pain, orthopnea, edema.  GASTROINTESTINAL: positive for nausea, vomiting, diarrhea or abdominal pain.  GENITOURINARY: No dysuria, hematuria.  ENDOCRINE: No polyuria, nocturia,  HEMATOLOGY: No anemia, easy bruising or bleeding SKIN: No rash or lesion. MUSCULOSKELETAL: No joint pain or arthritis.   NEUROLOGIC: No tingling, numbness, weakness.  PSYCHIATRY: No anxiety or depression.   ROS  DRUG ALLERGIES:   Allergies  Allergen Reactions  . Citalopram Other (See Comments)    Reaction:  Altered mental status   . Cymbalta [Duloxetine Hcl] Other (See Comments)    Reaction:  Sedative for pt   . Imipramine Other (See Comments)    Reaction:  Unknown   . Proton Pump Inhibitors Other (See Comments)    Reaction:  Unknown   . Venlafaxine Nausea And Vomiting and Other (See Comments)    Reaction:  Dizziness     VITALS:  Blood pressure (!) 149/48, pulse 94, temperature 98.3 F (36.8 C), temperature source Oral, resp.  rate 18, height 5\' 4"  (1.626 m), weight 121.2 kg (267 lb 4.8 oz), SpO2 99 %.  PHYSICAL EXAMINATION:  GENERAL:  82 y.o.-year-old patient lying in the bed with no acute distress.  EYES: Pupils equal, round, reactive to light and accommodation. No scleral icterus. Extraocular muscles intact.  HEENT: Head atraumatic, normocephalic. Oropharynx and nasopharynx clear.  NECK:  Supple, no jugular venous distention. No thyroid enlargement, no tenderness.  LUNGS: Normal breath sounds bilaterally, no wheezing, rales,rhonchi or crepitation. No use of accessory muscles of respiration.  CARDIOVASCULAR: S1, S2 fast and regular. No murmurs, rubs, or gallops.  ABDOMEN: Soft, nontender, distended. Bowel sounds present. No organomegaly or mass.  EXTREMITIES: No pedal edema, cyanosis, or clubbing.  NEUROLOGIC: Cranial nerves II through XII are intact. Muscle strength 3-4/5 in all extremities. Sensation intact. Gait not checked.  PSYCHIATRIC: The patient is alert and oriented x 3.  SKIN: No obvious rash, lesion, or ulcer.   Physical Exam LABORATORY PANEL:   CBC  Recent Labs Lab 09/20/16 1101  WBC 8.1  HGB 12.1  HCT 36.1  PLT 275   ------------------------------------------------------------------------------------------------------------------  Chemistries   Recent Labs Lab 09/17/16 1115  09/20/16 1101  NA 138  < > 140  K 3.5  < > 3.3*  CL 102  < > 105  CO2 26  < > 25  GLUCOSE 208*  < > 155*  BUN 11  < >  11  CREATININE 0.75  < > 0.65  CALCIUM 9.1  < > 9.2  MG 2.0  --  2.0  AST 35  --   --   ALT 20  --   --   ALKPHOS 77  --   --   BILITOT 0.7  --   --   < > = values in this interval not displayed. ------------------------------------------------------------------------------------------------------------------  Cardiac Enzymes  Recent Labs Lab 09/17/16 2337 09/18/16 0601  TROPONINI <0.03 <0.03    ------------------------------------------------------------------------------------------------------------------  RADIOLOGY:  No results found.  ASSESSMENT AND PLAN:   Active Problems:   Diarrhea   Dehydration   Metabolic acidosis   DNR (do not resuscitate)   Palliative care by specialist   Muscle weakness (generalized)   Atrial tachycardia ([place])  #1. Diarrhea, seems to be chronic, due to metformin?,    gastrointestinal panel negative to rule out bacterial infection, C. difficile was negative, continue supportive therapy,   CT of abdomen and pelvis was unremarkable   She saw Dr. Donnella Sham in office in past - suggested PPI BID and reglan, and if no help then may consider EGD.   I will wait for Dr. Tiffany Kocher for EGD. #2. Intermittent nausea and vomiting,   clear liquid diet, however, she refused, requested, regular diet,   supportive care for nausea.  She tolerated 1-2 meals without any vomiting but just some nausea. #3. Lactic acidosis, suspend metformin, continue IV fluids, follow lactic acid level #4. Bilateral lower extremity swelling , negatvie ultrasound of the lower extremities to rule out DVT   #5. Diabetes mellitus type 2, continue sliding scale insulin, 7.7- hemoglobin A1c #6 A fib with RVR- have Hx of CHF and PPM    Cardiology consult.   Magnesium normal.   Increased metoprolol dose.   Now suggested to change to oral Cardizem. #7 generalized weakness   PT eval.   Suggested SNF.   All the records are reviewed and case discussed with Care Management/Social Workerr. Management plans discussed with the patient, family and they are in agreement.  CODE STATUS: full.  TOTAL TIME TAKING CARE OF THIS PATIENT: 35 minutes.    POSSIBLE D/C IN 1-2 DAYS, DEPENDING ON CLINICAL CONDITION.   Vaughan Basta M.D on 09/21/2016   Between 7am to 6pm - Pager - [phone]  After 6pm go to [email or web address] - password [email or web address] [facility]  Office   [phone]  CC: Primary care physician; [name], MD  Note: This dictation was prepared with Dragon dictation along with smaller phrase technology. Any transcriptional errors that result from this process are unintentional.

## 2016-09-21 NOTE — Progress Notes (Signed)
CCMD paged RN, patient had tachy arrythmia with heart rate that went up to 150. BP 120/51. MD made aware, no new orders at this time.  Deri Fuelling, RN

## 2016-09-21 NOTE — Care Management (Addendum)
Patient discharging home today. I have notified Santiago Glad with [facility] home [facility]. I have notified Tonita Cong has also been notified by this RNCM of patient's return today. No further RNCM needs. EMS packet completed.

## 2016-09-21 NOTE — Care Management (Signed)
Michele Schultz with [facility] home [facility] will visit with this patient again today. EMS packet ready.

## 2016-09-22 DIAGNOSIS — E860 Dehydration: Secondary | ICD-10-CM | POA: Diagnosis not present

## 2016-09-22 LAB — BASIC METABOLIC PANEL
Anion gap: 9 (ref 5–15)
BUN: 10 mg/dL (ref 6–20)
CHLORIDE: 103 mmol/L (ref 101–111)
CO2: 28 mmol/L (ref 22–32)
CREATININE: 0.73 mg/dL (ref 0.44–1.00)
Calcium: 9.3 mg/dL (ref 8.9–10.3)
GFR calc Af Amer: >60 mL/min (ref >60)
GFR calc non Af Amer: >60 mL/min (ref >60)
GLUCOSE: 155 mg/dL — AB (ref 65–99)
POTASSIUM: 3.2 mmol/L — AB (ref 3.5–5.1)
Sodium: 140 mmol/L (ref 135–145)

## 2016-09-22 LAB — GLUCOSE, CAPILLARY
GLUCOSE-CAPILLARY: 137 mg/dL — AB (ref 65–99)
GLUCOSE-CAPILLARY: 182 mg/dL — AB (ref 65–99)
Glucose-Capillary: 157 mg/dL — ABNORMAL HIGH (ref 65–99)

## 2016-09-22 MED ORDER — PROMETHAZINE HCL 12.5 MG PO TABS: 12.5000 mg | ORAL_TABLET | Freq: Four times a day (QID) | ORAL | 0 refills | Status: DC | PRN

## 2016-09-22 NOTE — Progress Notes (Signed)
LCSW following again for placement. Family and patient interested in ALF placement and [facility] is in the process of reviewing her information and assessed her early Friday AM.  They have asked for additional information and LCSW has supplied information.  LCSW will continue to follow and assist discharge planning.  Lane Hacker, MSW Clinical Social Work: [employer]

## 2016-09-22 NOTE — Care Management (Signed)
CSW to follow up with [facility] to see if they will accept patient today. Patient is aware that she has a discharge and if [facility] cannot accept her she will need to go back to [facility] with home health and wait for [facility] to have a bed.

## 2016-09-22 NOTE — Progress Notes (Signed)
[place] at [place] NAME: Michele Schultz    MR#:  [id number]  DATE OF BIRTH:  [birth year]  SUBJECTIVE:  CHIEF COMPLAINT:   Chief Complaint  Patient presents with  . Nausea  . Emesis    For 4-5 months have c/o nausea , diarrhea- came as worsening for 1 week.  Also have a fib and v tech on tele today.  She was seen by Dr. Donnella Sham in office in past- was advised to take PPI BID and Reglan, and suggested to be high risk for procedure, but - IF her problem does not resolve, MAY need EGD. I spoke to Dr. Tiffany Kocher , he suggested to give round the clock reglan. Pt tolerated, plan was to d/c home with home health- but pt refused and did not go today.  REVIEW OF SYSTEMS:  CONSTITUTIONAL: No fever, fatigue or weakness.  EYES: No blurred or double vision.  EARS, NOSE, AND THROAT: No tinnitus or ear pain.  RESPIRATORY: No cough, shortness of breath, wheezing or hemoptysis.  CARDIOVASCULAR: No chest pain, orthopnea, edema.  GASTROINTESTINAL: positive for nausea, vomiting, diarrhea or abdominal pain.  GENITOURINARY: No dysuria, hematuria.  ENDOCRINE: No polyuria, nocturia,  HEMATOLOGY: No anemia, easy bruising or bleeding SKIN: No rash or lesion. MUSCULOSKELETAL: No joint pain or arthritis.   NEUROLOGIC: No tingling, numbness, weakness.  PSYCHIATRY: No anxiety or depression.   ROS  DRUG ALLERGIES:   Allergies  Allergen Reactions  . Citalopram Other (See Comments)    Reaction:  Altered mental status   . Cymbalta [Duloxetine Hcl] Other (See Comments)    Reaction:  Sedative for pt   . Imipramine Other (See Comments)    Reaction:  Unknown   . Proton Pump Inhibitors Other (See Comments)    Reaction:  Unknown   . Venlafaxine Nausea And Vomiting and Other (See Comments)    Reaction:  Dizziness     VITALS:  Blood pressure (!) 118/50, pulse (!) 113, temperature 98.5 F (36.9 C), temperature source Oral, resp. rate 18, height 5\' 4"  (1.626 m), weight  120.9 kg (266 lb 7.3 oz), SpO2 100 %.  PHYSICAL EXAMINATION:  GENERAL:  82 y.o.-year-old patient lying in the bed with no acute distress.  EYES: Pupils equal, round, reactive to light and accommodation. No scleral icterus. Extraocular muscles intact.  HEENT: Head atraumatic, normocephalic. Oropharynx and nasopharynx clear.  NECK:  Supple, no jugular venous distention. No thyroid enlargement, no tenderness.  LUNGS: Normal breath sounds bilaterally, no wheezing, rales,rhonchi or crepitation. No use of accessory muscles of respiration.  CARDIOVASCULAR: S1, S2 fast and regular. No murmurs, rubs, or gallops.  ABDOMEN: Soft, nontender, distended. Bowel sounds present. No organomegaly or mass.  EXTREMITIES: No pedal edema, cyanosis, or clubbing.  NEUROLOGIC: Cranial nerves II through XII are intact. Muscle strength 3-4/5 in all extremities. Sensation intact. Gait not checked.  PSYCHIATRIC: The patient is alert and oriented x 3.  SKIN: No obvious rash, lesion, or ulcer.   Physical Exam LABORATORY PANEL:   CBC  Recent Labs Lab 09/20/16 1101  WBC 8.1  HGB 12.1  HCT 36.1  PLT 275   ------------------------------------------------------------------------------------------------------------------  Chemistries   Recent Labs Lab 09/17/16 1115  09/20/16 1101 09/22/16 0825  NA 138  < > 140 140  K 3.5  < > 3.3* 3.2*  CL 102  < > 105 103  CO2 26  < > 25 28  GLUCOSE 208*  < > 155* 155*  BUN 11  < >  11 10  CREATININE 0.75  < > 0.65 0.73  CALCIUM 9.1  < > 9.2 9.3  MG 2.0  --  2.0  --   AST 35  --   --   --   ALT 20  --   --   --   ALKPHOS 77  --   --   --   BILITOT 0.7  --   --   --   < > = values in this interval not displayed. ------------------------------------------------------------------------------------------------------------------  Cardiac Enzymes  Recent Labs Lab 09/17/16 2337 09/18/16 0601  TROPONINI <0.03 <0.03    ------------------------------------------------------------------------------------------------------------------  RADIOLOGY:  No results found.  ASSESSMENT AND PLAN:   Active Problems:   Diarrhea   Dehydration   Metabolic acidosis   DNR (do not resuscitate)   Palliative care by specialist   Muscle weakness (generalized)   Atrial tachycardia ([place])  #1. Diarrhea, seems to be chronic, due to metformin?,  gastrointestinal panel negative to rule out bacterial infection, C. difficile was negative, continue supportive therapy,  CT of abdomen and pelvis was unremarkable She saw Dr. Donnella Sham in office in past - suggested PPI BID and reglan, and if no help then may consider EGD. I spoke to Dr. Vira Agar- he suggested to start her on scheduled dose of Zofran before each meal.   Patient is feeling better in hospital and did not had any vomiting but just had some nausea, and did not had diarrhea. #2. Intermittent nausea and vomiting,  clear liquid diet, however, she refused, requested, regular diet,  supportive care for nausea.  these are chronic symptoms, and overall feels little improvement with therapy in hospital without any episodes of nausea. #3. Lactic acidosis, suspend metformin, continue IV fluids, follow lactic acid level #4. Bilateral lower extremity swelling , negatvie ultrasound of the lower extremities to rule out DVT  #5. Diabetes mellitus type 2, continue sliding scale insulin, 7.7- hemoglobin A1c #6 A fib with RVR- have Hx of CHF and PPM Cardiology consult. Magnesium normal. Increased metoprolol dose. Now suggested to change to oral Cardizem. #7 generalized weakness PT eval. Suggested SNF. As she has already used up all her days for rehabilitation, there is no clear source of payment for her rehabilitation placement, she does not want to pay out of pocket for that.   Case manager will arrange for home health at home. Medically stable for  discharge.    But pt still refused for that, and did not go home.   All the records are reviewed and case discussed with Care Management/Social Workerr. Management plans discussed with the patient, family and they are in agreement.  CODE STATUS: full.  TOTAL TIME TAKING CARE OF THIS PATIENT: 35 minutes.    POSSIBLE D/C IN 1-2 DAYS, DEPENDING ON CLINICAL CONDITION.   Vaughan Basta M.D on 09/22/2016   Between 7am to 6pm - Pager - [phone]  After 6pm go to [email or web address] - password [email or web address] [facility]  Office  [phone]  CC: Primary care physician; [name], MD  Note: This dictation was prepared with Dragon dictation along with smaller phrase technology. Any transcriptional errors that result from this process are unintentional.

## 2016-09-22 NOTE — Discharge Summary (Signed)
[place] at [place] NAME: Michele Schultz    MR#:  [id number]  DATE OF BIRTH:  17-Dec-1930  DATE OF ADMISSION:  09/17/2016 ADMITTING PHYSICIAN: Theodoro Grist, MD  DATE OF DISCHARGE: 09/22/2016  PRIMARY CARE PHYSICIAN: [name], MD    ADMISSION DIAGNOSIS:  Dehydration [E86.0] Swelling [R60.9] Gastroenteritis [K52.9] Tachycardia [R00.0] Elevated lactic acid level [R79.89]  DISCHARGE DIAGNOSIS:  Active Problems:   Diarrhea   Dehydration   Metabolic acidosis   DNR (do not resuscitate)   Palliative care by specialist   Muscle weakness (generalized)   Atrial tachycardia ([place])   SECONDARY DIAGNOSIS:   Past Medical History:  Diagnosis Date  . Anemia   . Aortic stenosis, severe    a. s/p Magna Ease pericardial tissue valve size 21 mm replacement in 10/2011 for severe AS 11/12; b. echo 07/2015: EF 55-60% mod concentric LVH, GR1DD, LA mildly dilated, PASP 45 mm Hg  . Atrial tachycardia, paroxysmal (HCC)    a. with rate related LBBB.  . Cardiac pacemaker -st Judes    11/12  . Chronic diastolic heart failure ([place])    a. echo 2014: EF 55-60%, no RWMA, GR1DD, PASP 47 mm Hg; b. echo 07/2015: EF 55-60% mod concentric LVH, GR1DD, LA mildly dilated, PASP 45 mm Hg  . Complete heart block [facility])    a. s/p St Jude PPM 10/2011 (Ser # [id number]).  . Coronary artery disease    a. s/p 2v CABG 11/12 (VG-LAD, VG-OM1); b. Lexiscan 08/2015: low risk, no ischemia, EF 55-65%.  . Enthesopathy of hip region   . Esophageal reflux    followed by Dr.Seigal. stabilized with a combination of Nexium and Zantac  . Fibromyalgia   . HLD (hyperlipidemia)   . Hypertensive heart disease   . Knee joint replacement by other means   . Morbid obesity ([place])   . Neuralgia, neuritis, and radiculitis, unspecified   . Neuropathy ([place])   . Onychia and paronychia of toe   . Osteoarthrosis, unspecified whether generalized or localized, pelvic region and thigh     mainly in her back and knees  . PAF (paroxysmal atrial fibrillation) ([place])    a. brief episodes of AF previously noted on device interrogations.  [facility] (reactive airway disease)    a. chronic SOB  . Spinal stenosis   . Stress incontinence, female    followed by Dr.Cope  . Type II or unspecified type diabetes mellitus without mention of complication, not stated as uncontrolled    a. pt. reports that she is borderline   . Wide-complex tachycardia ([place])    a. Noted 4/10 - brief episode in ED. Noted again 4/21 in clinic->felt most likely to be atrial tach.    HOSPITAL COURSE:   #1. Diarrhea, seems to be chronic, due to metformin?,    gastrointestinal panel negative to rule out bacterial infection, C. difficile was negative, continue supportive therapy,   CT of abdomen and pelvis was unremarkable   She saw Dr. Donnella Sham in office in past - suggested PPI BID and reglan, and if no help then may consider EGD.   I spoke to Dr. Vira Agar- he suggested to start her on scheduled dose of Zofran before each meal.   Patient is feeling better in hospital and did not had any vomiting but just had some nausea, and did not had diarrhea. #2. Intermittent nausea and vomiting,   clear liquid diet, however, she refused, requested, regular diet,   supportive care  for nausea.   these are chronic symptoms, and overall feels little improvement with therapy in hospital without any episodes of nausea. #3. Lactic acidosis, suspend metformin, continue IV fluids, follow lactic acid level #4. Bilateral lower extremity swelling , negatvie ultrasound of the lower extremities to rule out DVT  #5. Diabetes mellitus type 2, continue sliding scale insulin, 7.7- hemoglobin A1c #6 A fib with RVR- have Hx of CHF and PPM    Cardiology consult.   Magnesium normal.   Increased metoprolol dose.   Now suggested to change to oral Cardizem. #7 generalized weakness   PT eval.   Suggested SNF. As she has already used up all her  days for rehabilitation, there is no clear source of payment for her rehabilitation placement, she does not want to pay out of pocket for that.   Case manager will arrange for home health at home. Medically stable for discharge.   Pt refused for going home, next day social worker arranged for assisted livign place as pt is complaining, she can not live alone- she needs help.   DISCHARGE CONDITIONS:   Stable  CONSULTS OBTAINED:  Treatment Team:  San Jetty, MD Manya Silvas, MD  DRUG ALLERGIES:   Allergies  Allergen Reactions  . Citalopram Other (See Comments)    Reaction:  Altered mental status   . Cymbalta [Duloxetine Hcl] Other (See Comments)    Reaction:  Sedative for pt   . Imipramine Other (See Comments)    Reaction:  Unknown   . Proton Pump Inhibitors Other (See Comments)    Reaction:  Unknown   . Venlafaxine Nausea And Vomiting and Other (See Comments)    Reaction:  Dizziness     DISCHARGE MEDICATIONS:   Current Discharge Medication List    START taking these medications   Details  diltiazem (CARDIZEM CD) 120 MG 24 hr capsule Take 1 capsule (120 mg total) by mouth daily. Qty: 30 capsule, Refills: 0    metoCLOPramide (REGLAN) 5 MG tablet Take 1 tablet (5 mg total) by mouth every 6 (six) hours as needed for nausea or vomiting. Qty: 50 tablet, Refills: 0    promethazine (PHENERGAN) 12.5 MG tablet Take 1 tablet (12.5 mg total) by mouth every 6 (six) hours as needed for nausea or vomiting. Qty: 20 tablet, Refills: 0      CONTINUE these medications which have CHANGED   Details  ondansetron (ZOFRAN-ODT) 4 MG disintegrating tablet Take 1 tablet (4 mg total) by mouth 4 (four) times daily. Qty: 100 tablet, Refills: 0      CONTINUE these medications which have NOT CHANGED   Details  acetaminophen (TYLENOL) 325 MG tablet Take 650 mg by mouth 3 (three) times daily as needed.    acidophilus (RISAQUAD) CAPS capsule Take 1 capsule by mouth 2 (two) times  daily. Qty: 1 capsule, Refills: 0    aspirin EC 81 MG tablet Take 81 mg by mouth daily.    atorvastatin (LIPITOR) 80 MG tablet Take 80 mg by mouth daily.    beclomethasone (QVAR) 80 MCG/ACT inhaler Inhale 1 puff into the lungs 2 (two) times daily.    Calcium Carbonate-Vitamin D 600-400 MG-UNIT tablet Take 1 tablet by mouth daily.    Cholecalciferol 1000 units capsule Take 1,000 Units by mouth daily.    cyanocobalamin (,VITAMIN B-12,) 1000 MCG/ML injection Inject 1,000 mcg into the muscle every 30 (thirty) days. On the 16th of each month    furosemide (LASIX) 20 MG tablet Take 1 tablet (  20 mg total) by mouth every other day. Qty: 30 tablet, Refills: 6    gabapentin (NEURONTIN) 300 MG capsule Take 300 mg by mouth 2 (two) times daily.    HYDROcodone-acetaminophen (NORCO) 10-325 MG tablet Take 1 tablet by mouth every 4 (four) hours as needed for moderate pain. Reported on 05/30/2016 Qty: 30 tablet, Refills: 0    ipratropium-albuterol (DUONEB) 0.5-2.5 (3) MG/3ML SOLN Take 3 mLs by nebulization every 6 (six) hours as needed.    lidocaine (LIDODERM) 5 % Place 1 patch onto the skin every 12 (twelve) hours. Remove & Discard patch within 12 hours or as directed by MD    magnesium hydroxide (MILK OF MAGNESIA) 400 MG/5ML suspension Take 10 mLs by mouth daily as needed for mild constipation.     metFORMIN (GLUCOPHAGE) 500 MG tablet Take 500 mg by mouth daily.    nitroGLYCERIN (NITROSTAT) 0.4 MG SL tablet Place 1 tablet (0.4 mg total) under the tongue every 5 (five) minutes as needed for chest pain. Qty: 90 tablet, Refills: 3    pantoprazole (PROTONIX) 40 MG tablet Take 40 mg by mouth 2 (two) times daily.      Potassium Chloride ER 20 MEQ TBCR Take 20 mEq by mouth 3 (three) times daily.     ranitidine (ZANTAC) 150 MG tablet Take 150 mg by mouth 2 (two) times daily.    sennosides-docusate sodium (SENOKOT-S) 8.6-50 MG tablet Take 2 tablets by mouth daily.    sucralfate (CARAFATE) 1 g tablet  Take 1 g by mouth 4 (four) times daily.    traMADol (ULTRAM) 50 MG tablet Take 1 tablet (50 mg total) by mouth every 6 (six) hours as needed. Qty: 20 tablet, Refills: 0      STOP taking these medications     amLODipine (NORVASC) 5 MG tablet      losartan (COZAAR) 25 MG tablet      metoprolol succinate (TOPROL-XL) 25 MG 24 hr tablet      ondansetron (ZOFRAN) 4 MG tablet      ondansetron (ZOFRAN) 4 MG tablet          DISCHARGE INSTRUCTIONS:    Follow with primary care doctor and his GI doctor as needed.  If you experience worsening of your admission symptoms, develop shortness of breath, life threatening emergency, suicidal or homicidal thoughts you must seek medical attention immediately by calling 911 or calling your MD immediately  if symptoms less severe.  You Must read complete instructions/literature along with all the possible adverse reactions/side effects for all the Medicines you take and that have been prescribed to you. Take any new Medicines after you have completely understood and accept all the possible adverse reactions/side effects.   Please note  You were cared for by a hospitalist during your hospital stay. If you have any questions about your discharge medications or the care you received while you were in the hospital after you are discharged, you can call the unit and asked to speak with the hospitalist on call if the hospitalist that took care of you is not available. Once you are discharged, your primary care physician will handle any further medical issues. Please note that NO REFILLS for any discharge medications will be authorized once you are discharged, as it is imperative that you return to your primary care physician (or establish a relationship with a primary care physician if you do not have one) for your aftercare needs so that they can reassess your need for medications and monitor your  lab values.    Today   CHIEF COMPLAINT:   Chief Complaint   Patient presents with  . Nausea  . Emesis    HISTORY OF PRESENT ILLNESS:  Michele Schultz  is a 82 y.o. female with a known history of anemia, severe aortic stenosis, chronic diastolic CHF, complete heart block, status post permanent pacemaker, coronary artery disease, gastroesophageal reflux disease disease, hyperlipidemia, morbid obesity, who presents to the hospital with complaints of falls, weakness, diarrhea. When the patient, she was doing well up until a few months ago when she fell down and hurt her leg, she was sent to rehabilitation facility for 1-2 months, she came up from rehabilitation facility, however, feels that she is still very weak and not doing too well. She's been also having intermittent episodes of nausea and vomiting, diarrhea, on-and-off for the past 4-5 months, especially worse over the past one week. Patient  admits of colonoscopy years ago. Due to significant weakness, she presented to emergency room for further evaluation and treatment, hospitalist services were contacted for admission, since patient was noted to be tachycardic and had elevated lactic acid level.  VITAL SIGNS:  Blood pressure (!) 118/50, pulse (!) 113, temperature 98.5 F (36.9 C), temperature source Oral, resp. rate 18, height 5\' 4"  (1.626 m), weight 120.9 kg (266 lb 7.3 oz), SpO2 100 %.  I/O:    Intake/Output Summary (Last 24 hours) at 09/22/16 1105 Last data filed at 09/21/16 2300  Gross per 24 hour  Intake              483 ml  Output                0 ml  Net              483 ml    PHYSICAL EXAMINATION:   GENERAL:  82 y.o.-year-old patient lying in the bed with no acute distress.  EYES: Pupils equal, round, reactive to light and accommodation. No scleral icterus. Extraocular muscles intact.  HEENT: Head atraumatic, normocephalic. Oropharynx and nasopharynx clear.  NECK:  Supple, no jugular venous distention. No thyroid enlargement, no tenderness.  LUNGS: Normal breath sounds bilaterally,  no wheezing, rales,rhonchi or crepitation. No use of accessory muscles of respiration.  CARDIOVASCULAR: S1, S2 fast and regular. No murmurs, rubs, or gallops.  ABDOMEN: Soft, nontender, distended. Bowel sounds present. No organomegaly or mass.  EXTREMITIES: No pedal edema, cyanosis, or clubbing.  NEUROLOGIC: Cranial nerves II through XII are intact. Muscle strength 3-4/5 in all extremities. Sensation intact. Gait not checked.  PSYCHIATRIC: The patient is alert and oriented x 3.  SKIN: No obvious rash, lesion, or ulcer.    DATA REVIEW:   CBC  Recent Labs Lab 09/20/16 1101  WBC 8.1  HGB 12.1  HCT 36.1  PLT 275    Chemistries   Recent Labs Lab 09/17/16 1115  09/20/16 1101 09/22/16 0825  NA 138  < > 140 140  K 3.5  < > 3.3* 3.2*  CL 102  < > 105 103  CO2 26  < > 25 28  GLUCOSE 208*  < > 155* 155*  BUN 11  < > 11 10  CREATININE 0.75  < > 0.65 0.73  CALCIUM 9.1  < > 9.2 9.3  MG 2.0  --  2.0  --   AST 35  --   --   --   ALT 20  --   --   --   ALKPHOS 77  --   --   --  BILITOT 0.7  --   --   --   < > = values in this interval not displayed.  Cardiac Enzymes  Recent Labs Lab 09/18/16 0601  TROPONINI <0.03    Microbiology Results  Results for orders placed or performed during the hospital encounter of 09/17/16  C difficile quick scan w PCR reflex     Status: None   Collection Time: 09/17/16 12:28 PM  Result Value Ref Range Status   C Diff antigen NEGATIVE NEGATIVE Final   C Diff toxin NEGATIVE NEGATIVE Final   C Diff interpretation No C. difficile detected.  Final  Gastrointestinal Panel by PCR , Stool     Status: None   Collection Time: 09/17/16 12:28 PM  Result Value Ref Range Status   Campylobacter species NOT DETECTED NOT DETECTED Final   Plesimonas shigelloides NOT DETECTED NOT DETECTED Final   Salmonella species NOT DETECTED NOT DETECTED Final   Yersinia enterocolitica NOT DETECTED NOT DETECTED Final   Vibrio species NOT DETECTED NOT DETECTED Final    Vibrio cholerae NOT DETECTED NOT DETECTED Final   Enteroaggregative E coli (EAEC) NOT DETECTED NOT DETECTED Final   Enteropathogenic E coli (EPEC) NOT DETECTED NOT DETECTED Final   Enterotoxigenic E coli (ETEC) NOT DETECTED NOT DETECTED Final   Shiga like toxin producing E coli (STEC) NOT DETECTED NOT DETECTED Final   Shigella/Enteroinvasive E coli (EIEC) NOT DETECTED NOT DETECTED Final   Cryptosporidium NOT DETECTED NOT DETECTED Final   Cyclospora cayetanensis NOT DETECTED NOT DETECTED Final   Entamoeba histolytica NOT DETECTED NOT DETECTED Final   Giardia lamblia NOT DETECTED NOT DETECTED Final   Adenovirus F40/41 NOT DETECTED NOT DETECTED Final   Astrovirus NOT DETECTED NOT DETECTED Final   Norovirus GI/GII NOT DETECTED NOT DETECTED Final   Rotavirus A NOT DETECTED NOT DETECTED Final   Sapovirus (I, II, IV, and V) NOT DETECTED NOT DETECTED Final    RADIOLOGY:  No results found.  EKG:   Orders placed or performed during the hospital encounter of 09/17/16  . EKG 12-Lead  . EKG 12-Lead  . EKG 12-Lead  . EKG 12-Lead      Management plans discussed with the patient, family and they are in agreement.  CODE STATUS: DNR    Code Status Orders        Start     Ordered   09/19/16 1052  Do not attempt resuscitation (DNR)  Continuous    Question Answer Comment  In the event of cardiac or respiratory ARREST Do not call a "code blue"   In the event of cardiac or respiratory ARREST Do not perform Intubation, CPR, defibrillation or ACLS   In the event of cardiac or respiratory ARREST Use medication by any route, position, wound care, and other measures to relive pain and suffering. May use oxygen, suction and manual treatment of airway obstruction as needed for comfort.      09/19/16 1052    Code Status History    Date Active Date Inactive Code Status Order ID Comments User Context   09/17/2016  5:57 PM 09/19/2016 10:52 AM Full Code [id number]  Theodoro Grist, MD Inpatient    06/23/2016  8:20 PM 06/26/2016  1:26 PM Full Code [id number]  Gladstone Lighter, MD Inpatient   06/16/2016  8:35 PM 06/21/2016 10:09 PM Full Code [id number]  Lytle Butte, MD ED   03/13/2016  8:15 PM 03/14/2016  9:02 PM Full Code [id number]  Henreitta Leber, MD Inpatient   09/13/2015  6:51 PM 09/18/2015  7:31 PM Full Code [id number]  Dustin Flock, MD Inpatient   10/20/2011  1:40 PM 10/22/2011  1:27 PM Full Code [id number]  Tiajuana Amass, RN Inpatient      TOTAL TIME TAKING CARE OF THIS PATIENT: 35 minutes.    Vaughan Basta M.D on 09/22/2016 at 11:05 AM  Between 7am to 6pm - Pager - [phone]  After 6pm go to [email or web address] - password [email or web address] [facility]  Office  [phone]  CC: Primary care physician; [name], MD   Note: This dictation was prepared with Dragon dictation along with smaller phrase technology. Any transcriptional errors that result from this process are unintentional.

## 2016-09-22 NOTE — Progress Notes (Signed)
Patient being discharged to [facility] and report given to [facility]. Nurse tech packed belongings and patient has been prepared for transport and EMS has been called.

## 2016-09-22 NOTE — Clinical Social Work Placement (Signed)
   CLINICAL SOCIAL WORK PLACEMENT  NOTE  Date:  09/22/2016  Patient Details  Name: Michele Schultz MRN: [id number] Date of Birth: Jun 10, 1931  Clinical Social Work is seeking post-discharge placement for this patient at the   level of care (*CSW will initial, date and re-position this form in  chart as items are completed):  Yes   Patient/family provided with [place] Work Department's list of facilities offering this level of care within the geographic area requested by the patient (or if unable, by the patient's family).  Yes   Patient/family informed of their freedom to choose among providers that offer the needed level of care, that participate in Medicare, Medicaid or managed care program needed by the patient, have an available bed and are willing to accept the patient.  Yes   Patient/family informed of [facility]'s ownership interest in [facility] and [facility], as well as of the fact that they are under no obligation to receive care at these facilities.  PASRR submitted to EDS on 09/18/16     PASRR number received on 09/18/16     Existing PASRR number confirmed on       FL2 transmitted to all facilities in geographic area requested by pt/family on 09/18/16     FL2 transmitted to all facilities within larger geographic area on       Patient informed that his/her managed care company has contracts with or will negotiate with certain facilities, including the following:        Yes   Patient/family informed of bed offers received.  Patient chooses bed at  [facility])     Physician recommends and patient chooses bed at      Patient to be transferred to  [facility]) on 09/22/16.  Patient to be transferred to facility by [facility]     Patient family notified on 09/22/16 of transfer.  Name of family member notified:  Gerarda Fraction [phone]      PHYSICIAN       Additional Comment:     _______________________________________________ Ross Ludwig 09/22/2016, 3:56 PM

## 2016-09-22 NOTE — Care Management (Signed)
Met with patient today to deliver [place] letter. MD discharged patient on 09/21/16 and patient agreed up until the point that EMS arrived and then she declined discharge. Patient does this every time she comes to the hospital. She needs a higher level of care and multiple attempts have been made for patient and she returns to independent living at [facility]. Again, during my visit patient states she will "give up her apartment and just not go back to [facility]" and "send my money to another facility". She states that [facility] "has a bed and going to take her today". CSW has confirmed this. Discharge is still in place. [facility] home care updated. EMS is ready.

## 2016-09-22 NOTE — Clinical Social Work Note (Addendum)
MSW was informed by patient and her [place], that patient can afford to go to [facility].  MSW received phone call from [place] at [facility] and she can accept patient tonight.  Patient to be d/c'ed today to [facility].  Patient and family agreeable to plans will transport via ems patient's niece [name] [phone] was informed that patient will be discharging today to ALF.  Evette Cristal, MSW Mon-Fri 8a-4:30p [phone]

## 2016-09-22 NOTE — NC FL2 (Signed)
[place] LEVEL OF CARE SCREENING TOOL     IDENTIFICATION  Patient Name: PHRONIA ASCH Birthdate: 07-12-1931 Sex: female Admission Date (Current Location): 09/17/2016  [place] and [place] Number:  Engineering geologist and Address:  [facility], [address] [place],  [address]      Provider Number: [id number]  Attending Physician Name and Address:  Vaughan Basta, MD  Relative Name and Phone Number:       Current Level of Care: Hospital Recommended Level of Care: [place] Prior Approval Number:    Date Approved/Denied:   PASRR Number: [id number]  Discharge Plan: Domiciliary (Rest home)    Current Diagnoses: Patient Active Problem List   Diagnosis Date Noted  . DNR (do not resuscitate) 09/19/2016  . Palliative care by specialist 09/19/2016  . Muscle weakness (generalized)   . Atrial tachycardia ([place])   . Diarrhea 09/17/2016  . Dehydration 09/17/2016  . Metabolic acidosis [id number]  . Headache due to trauma 07/03/2016  . Hypertensive heart disease   . Wide-complex tachycardia ([place])   . Pain in the chest   . Emesis   . Weakness of both legs   . Coronary artery disease involving coronary bypass graft of native heart with unspecified angina pectoris   . S/P AVR (aortic valve replacement)   . Falls frequently   . HLD (hyperlipidemia)   . Fibromyalgia   . RAD (reactive airway disease)   . Morbid obesity ([place])   . Aortic stenosis, severe   . Chronic pain 06/17/2014  . Pain in the muscles 06/19/2013  . Chest pain 06/19/2013  . Wheezing 06/19/2013  . Weakness generalized 06/03/2013  . Dyspnea 01/27/2013  . Nausea 03/05/2012  . Constipation 03/05/2012  . Atrial fibrillation-non sustained 02/09/2012  . Cardiac pacemaker -st [place]   . Chronic diastolic heart failure ([place])   . Coronary artery disease   . Long term (current) use of anticoagulants 01/03/2012  . S/P CABG x 2 11/14/2011  .  S/P aortic valve replacement with porcine valve 11/14/2011  . Back pain 11/14/2011  . Complete heart block ([place]) 10/20/2011  . HTN (hypertension) 09/15/2011  . Spinal stenosis 03/22/2011  . Fatigue 03/22/2011  . Hyperlipidemia 03/22/2011    Orientation RESPIRATION BLADDER Height & Weight     Self, Time, Situation, Place  Normal Continent Weight: 266 lb 7.3 oz (120.9 kg) Height:  5\' 4"  (162.6 cm)  BEHAVIORAL SYMPTOMS/MOOD NEUROLOGICAL BOWEL NUTRITION STATUS   (None)  (None) Continent Diet (Carb Modified )  AMBULATORY STATUS COMMUNICATION OF NEEDS Skin   Limited Assist Verbally Normal                       Personal Care Assistance Level of Assistance  Bathing, Feeding, Dressing Bathing Assistance: Limited assistance Feeding assistance: Independent Dressing Assistance: Limited assistance     Functional Limitations Info  Sight, Hearing, Speech Sight Info: Adequate Hearing Info: Adequate Speech Info: Adequate    SPECIAL CARE FACTORS FREQUENCY  PT (By licensed PT)     PT Frequency:  (2-3)              Contractures      Additional Factors Info  Code Status, Allergies, Insulin Sliding Scale Code Status Info: Full Code Allergies Info: Citalopram, Cymbalta Duloxetine Hcl, Imipramine, Proton Pump Inhibitors, Venlafaxine   Insulin Sliding Scale Info: insulin aspart (novoLOG) injection 0-9 Units 0-9 Units, Subcutaneous, 3 times daily with meals  ; insulin aspart (novoLOG)  injection 3 Units 3 Units, Subcutaneous, 3 times daily with meals; insulin aspart (novoLOG) injection 0-5 Units 0-5 Units, Subcutaneous, Daily at bedtime         Current Medications (09/22/2016):  This is the current hospital active medication list Current Facility-Administered Medications  Medication Dose Route Frequency Provider Last Rate Last Dose  . acetaminophen (TYLENOL) tablet 650 mg  650 mg Oral Q4H PRN Theodoro Grist, MD   650 mg at 09/22/16 0830  . acidophilus (RISAQUAD) capsule 1 capsule   1 capsule Oral BID Theodoro Grist, MD   1 capsule at 09/22/16 0831  . aspirin EC tablet 81 mg  81 mg Oral Daily Theodoro Grist, MD   81 mg at 09/22/16 0834  . atorvastatin (LIPITOR) tablet 80 mg  80 mg Oral Daily Theodoro Grist, MD   80 mg at 09/22/16 0844  . budesonide (PULMICORT) nebulizer solution 0.25 mg  0.25 mg Nebulization BID Theodoro Grist, MD   0.25 mg at 09/22/16 0743  . calcium-vitamin D (OSCAL WITH D) 500-200 MG-UNIT per tablet 1 tablet  1 tablet Oral Daily Theodoro Grist, MD   1 tablet at 09/22/16 0835  . cholecalciferol (VITAMIN D) tablet 1,000 Units  1,000 Units Oral Daily Theodoro Grist, MD   1,000 Units at 09/22/16 0835  . cyanocobalamin ((VITAMIN B-12)) injection 1,000 mcg  1,000 mcg Intramuscular Q30 days Theodoro Grist, MD   1,000 mcg at 09/18/16 1155  . diltiazem (CARDIZEM) tablet 30 mg  30 mg Oral Q6H Ryan M Dunn, PA-C   30 mg at 09/21/16 2307  . enoxaparin (LOVENOX) injection 40 mg  40 mg Subcutaneous Q12H Theodoro Grist, MD   40 mg at 09/22/16 0838  . famotidine (PEPCID) tablet 20 mg  20 mg Oral BID Theodoro Grist, MD   20 mg at 09/22/16 0834  . furosemide (LASIX) tablet 20 mg  20 mg Oral Daily Vaughan Basta, MD   20 mg at 09/22/16 0836  . gabapentin (NEURONTIN) capsule 300 mg  300 mg Oral BID Theodoro Grist, MD   300 mg at 09/22/16 0835  . insulin aspart (novoLOG) injection 0-5 Units  0-5 Units Subcutaneous QHS Theodoro Grist, MD      . insulin aspart (novoLOG) injection 0-9 Units  0-9 Units Subcutaneous TID WC Theodoro Grist, MD   1 Units at 09/22/16 0836  . insulin aspart (novoLOG) injection 3 Units  3 Units Subcutaneous TID WC Theodoro Grist, MD   3 Units at 09/22/16 [phone]  . ipratropium-albuterol (DUONEB) 0.5-2.5 (3) MG/3ML nebulizer solution 3 mL  3 mL Nebulization Q4H PRN Theodoro Grist, MD      . lidocaine (LIDODERM) 5 % 1 patch  1 patch Transdermal Q12H Theodoro Grist, MD   1 patch at 09/19/16 0957  . metoCLOPramide (REGLAN) tablet 5 mg  5 mg Oral TID AC & HS Vaughan Basta, MD   5 mg at 09/22/16 0834  . nitroGLYCERIN (NITROSTAT) SL tablet 0.4 mg  0.4 mg Sublingual Q5 min PRN Theodoro Grist, MD      . ondansetron (ZOFRAN) injection 4 mg  4 mg Intravenous Q6H PRN Theodoro Grist, MD   4 mg at 09/20/16 1115  . ondansetron (ZOFRAN-ODT) disintegrating tablet 4 mg  4 mg Oral QID Vaughan Basta, MD   4 mg at 09/22/16 0835  . oxyCODONE (Oxy IR/ROXICODONE) immediate release tablet 5 mg  5 mg Oral Q6H PRN Vaughan Basta, MD      . pantoprazole (PROTONIX) EC tablet 40 mg  40 mg Oral BID  Theodoro Grist, MD   40 mg at 09/22/16 [id number]  . sodium chloride flush (NS) 0.9 % injection 3 mL  3 mL Intravenous Q12H Theodoro Grist, MD   3 mL at 09/21/16 1000  . sucralfate (CARAFATE) tablet 1 g  1 g Oral QID Theodoro Grist, MD   1 g at 09/22/16 [id number]  . traMADol (ULTRAM) tablet 50 mg  50 mg Oral Q6H PRN Theodoro Grist, MD   50 mg at 09/20/16 1808     Discharge Medications: Please see discharge summary for a list of discharge medications.  Relevant Imaging Results:  Relevant Lab Results:   Additional Information [id number]  [place], LCSW

## 2016-10-07 ENCOUNTER — Emergency Department: Payer: Medicare Other

## 2016-10-07 ENCOUNTER — Emergency Department
Admission: EM | Admit: 2016-10-07 | Discharge: 2016-10-07 | Disposition: A | Discharge status: Home / Self Care | Payer: Medicare Other | Attending: Emergency Medicine | Admitting: Emergency Medicine

## 2016-10-07 DIAGNOSIS — I5032 Chronic diastolic (congestive) heart failure: Secondary | ICD-10-CM | POA: Diagnosis not present

## 2016-10-07 DIAGNOSIS — I110 Hypertensive heart disease with heart failure: Secondary | ICD-10-CM | POA: Insufficient documentation

## 2016-10-07 DIAGNOSIS — I2510 Atherosclerotic heart disease of native coronary artery without angina pectoris: Secondary | ICD-10-CM | POA: Insufficient documentation

## 2016-10-07 DIAGNOSIS — R110 Nausea: Secondary | ICD-10-CM | POA: Diagnosis present

## 2016-10-07 DIAGNOSIS — E876 Hypokalemia: Secondary | ICD-10-CM | POA: Diagnosis not present

## 2016-10-07 DIAGNOSIS — E119 Type 2 diabetes mellitus without complications: Secondary | ICD-10-CM | POA: Insufficient documentation

## 2016-10-07 DIAGNOSIS — Z79899 Other long term (current) drug therapy: Secondary | ICD-10-CM | POA: Diagnosis not present

## 2016-10-07 DIAGNOSIS — Z7982 Long term (current) use of aspirin: Secondary | ICD-10-CM | POA: Insufficient documentation

## 2016-10-07 LAB — LIPASE, BLOOD: Lipase: 36 U/L (ref 11–51)

## 2016-10-07 LAB — CBC
HEMATOCRIT: 38 % (ref 35.0–47.0)
HEMOGLOBIN: 12.6 g/dL (ref 12.0–16.0)
MCH: 27.4 pg (ref 26.0–34.0)
MCHC: 33.1 g/dL (ref 32.0–36.0)
MCV: 82.9 fL (ref 80.0–100.0)
Platelets: 289 10*3/uL (ref 150–440)
RBC: 4.58 MIL/uL (ref 3.80–5.20)
RDW: 16.2 % — ABNORMAL HIGH (ref 11.5–14.5)
WBC: 7.6 10*3/uL (ref 3.6–11.0)

## 2016-10-07 LAB — URINALYSIS COMPLETE WITH MICROSCOPIC (ARMC ONLY)
BACTERIA UA: NONE SEEN
Bilirubin Urine: NEGATIVE
Glucose, UA: NEGATIVE mg/dL
KETONES UR: NEGATIVE mg/dL
NITRITE: NEGATIVE
PH: 6 (ref 5.0–8.0)
PROTEIN: 30 mg/dL — AB
SPECIFIC GRAVITY, URINE: 1.021 (ref 1.005–1.030)

## 2016-10-07 LAB — COMPREHENSIVE METABOLIC PANEL
ALBUMIN: 3.9 g/dL (ref 3.5–5.0)
ALT: 22 U/L (ref 14–54)
ANION GAP: 13 (ref 5–15)
AST: 47 U/L — ABNORMAL HIGH (ref 15–41)
Alkaline Phosphatase: 87 U/L (ref 38–126)
BILIRUBIN TOTAL: 0.9 mg/dL (ref 0.3–1.2)
BUN: 10 mg/dL (ref 6–20)
CO2: 33 mmol/L — ABNORMAL HIGH (ref 22–32)
Calcium: 9.5 mg/dL (ref 8.9–10.3)
Chloride: 93 mmol/L — ABNORMAL LOW (ref 101–111)
Creatinine, Ser: 0.72 mg/dL (ref 0.44–1.00)
GFR calc Af Amer: >60 mL/min (ref >60)
GLUCOSE: 168 mg/dL — AB (ref 65–99)
POTASSIUM: 2.2 mmol/L — AB (ref 3.5–5.1)
Sodium: 139 mmol/L (ref 135–145)
TOTAL PROTEIN: 8.1 g/dL (ref 6.5–8.1)

## 2016-10-07 LAB — TROPONIN I: Troponin I: <0.03 ng/mL (ref <0.03)

## 2016-10-07 MED ORDER — ONDANSETRON 4 MG PO TBDP
4.0000 mg | ORAL_TABLET | Freq: Three times a day (TID) | ORAL | 0 refills | Status: DC | PRN
Start: 2016-10-07 — End: 2016-11-06

## 2016-10-07 MED ORDER — ONDANSETRON 4 MG PO TBDP
4.0000 mg | ORAL_TABLET | Freq: Once | ORAL | Status: AC | PRN
  Administered 2016-10-07: 4 mg via ORAL
  Filled 2016-10-07: qty 1

## 2016-10-07 MED ORDER — ONDANSETRON 4 MG PO TBDP
8.0000 mg | ORAL_TABLET | Freq: Once | ORAL | Status: AC
  Administered 2016-10-07: 8 mg via ORAL
  Filled 2016-10-07: qty 2

## 2016-10-07 MED ORDER — POTASSIUM CHLORIDE CRYS ER 20 MEQ PO TBCR
40.0000 meq | EXTENDED_RELEASE_TABLET | Freq: Once | ORAL | Status: AC
  Administered 2016-10-07: 40 meq via ORAL
  Filled 2016-10-07: qty 2

## 2016-10-07 NOTE — ED Notes (Signed)
Pt tolerated fluids w/o difficulty.

## 2016-10-07 NOTE — ED Triage Notes (Signed)
Pt arrives via ems from [place] [facility], pt has been complaining of nausea today, pt states that she feels terrible, according to the patient's nurse per ems her perscription for zofran ran out. No distress noted

## 2016-10-07 NOTE — ED Notes (Signed)
Pt unable to void at this time. 

## 2016-10-07 NOTE — ED Provider Notes (Signed)
[facility] Emergency Department Provider Note  ____________________________________________  Time seen: Approximately 10:42 PM  I have reviewed the triage vital signs and the nursing notes.   HISTORY  Chief Complaint Nausea    HPI Michele Schultz is a 82 y.o. female who complains of nausea. Denies any other complaints. Eating and drinking normally. Has been on Zofran for nausea but ran out a few days ago. No cough fevers chills. No falls.     Past Medical History:  Diagnosis Date  . Anemia   . Aortic stenosis, severe    a. s/p Magna Ease pericardial tissue valve size 21 mm replacement in 10/2011 for severe AS 11/12; b. echo 07/2015: EF 55-60% mod concentric LVH, GR1DD, LA mildly dilated, PASP 45 mm Hg  . Atrial tachycardia, paroxysmal (HCC)    a. with rate related LBBB.  . Cardiac pacemaker -st Judes    11/12  . Chronic diastolic heart failure ([place])    a. echo 2014: EF 55-60%, no RWMA, GR1DD, PASP 47 mm Hg; b. echo 07/2015: EF 55-60% mod concentric LVH, GR1DD, LA mildly dilated, PASP 45 mm Hg  . Complete heart block [facility])    a. s/p St Jude PPM 10/2011 (Ser # [id number]).  . Coronary artery disease    a. s/p 2v CABG 11/12 (VG-LAD, VG-OM1); b. Lexiscan 08/2015: low risk, no ischemia, EF 55-65%.  . Enthesopathy of hip region   . Esophageal reflux    followed by Dr.Seigal. stabilized with a combination of Nexium and Zantac  . Fibromyalgia   . HLD (hyperlipidemia)   . Hypertensive heart disease   . Knee joint replacement by other means   . Morbid obesity ([place])   . Neuralgia, neuritis, and radiculitis, unspecified   . Neuropathy ([place])   . Onychia and paronychia of toe   . Osteoarthrosis, unspecified whether generalized or localized, pelvic region and thigh    mainly in her back and knees  . PAF (paroxysmal atrial fibrillation) ([place])    a. brief episodes of AF previously noted on device interrogations.  Marland Kitchen RAD (reactive airway disease)    a. chronic SOB   . Spinal stenosis   . Stress incontinence, female    followed by Dr.Cope  . Type II or unspecified type diabetes mellitus without mention of complication, not stated as uncontrolled    a. pt. reports that she is borderline   . Wide-complex tachycardia ([place])    a. Noted 4/10 - brief episode in ED. Noted again 4/21 in clinic->felt most likely to be atrial tach.     Patient Active Problem List   Diagnosis Date Noted  . DNR (do not resuscitate) 09/19/2016  . Palliative care by specialist 09/19/2016  . Muscle weakness (generalized)   . Atrial tachycardia ([place])   . Diarrhea 09/17/2016  . Dehydration 09/17/2016  . Metabolic acidosis [id number]  . Headache due to trauma 07/03/2016  . Hypertensive heart disease   . Wide-complex tachycardia ([place])   . Pain in the chest   . Emesis   . Weakness of both legs   . Coronary artery disease involving coronary bypass graft of native heart with unspecified angina pectoris   . S/P AVR (aortic valve replacement)   . Falls frequently   . HLD (hyperlipidemia)   . Fibromyalgia   . RAD (reactive airway disease)   . Morbid obesity ([place])   . Aortic stenosis, severe   . Chronic pain 06/17/2014  . Pain in the muscles 06/19/2013  . Chest  pain 06/19/2013  . Wheezing 06/19/2013  . Weakness generalized 06/03/2013  . Dyspnea 01/27/2013  . Nausea 03/05/2012  . Constipation 03/05/2012  . Atrial fibrillation-non sustained 02/09/2012  . Cardiac pacemaker -st Judes   . Chronic diastolic heart failure ([place])   . Coronary artery disease   . Long term (current) use of anticoagulants 01/03/2012  . S/P CABG x 2 11/14/2011  . S/P aortic valve replacement with porcine valve 11/14/2011  . Back pain 11/14/2011  . Complete heart block ([place]) 10/20/2011  . HTN (hypertension) 09/15/2011  . Spinal stenosis 03/22/2011  . Fatigue 03/22/2011  . Hyperlipidemia 03/22/2011     Past Surgical History:  Procedure Laterality Date  . AORTIC VALVE REPLACEMENT  10/17/2011    Procedure: AORTIC VALVE REPLACEMENT (AVR);  Surgeon: Melrose Nakayama, MD;  Location: [place];  Service: Open Heart Surgery;  Laterality: N/A;  . CARDIAC CATHETERIZATION  08/2009   50% stenosis distal left main, 50% stenosis ostial left circumflex.   Marland Kitchen CARDIAC CATHETERIZATION     at [facility]  . CORONARY ARTERY BYPASS GRAFT  10/17/2011   Procedure: CORONARY ARTERY BYPASS GRAFTING (CABG);  Surgeon: Melrose Nakayama, MD;  Location: [place];  Service: Open Heart Surgery;  Laterality: N/A;  Times Two, using right leg greater saphenous vein harvested endoscopically  . EYE SURGERY     IOL/ after cataracts removed- bilateral   . NASAL SINUS SURGERY  2009  . PERMANENT PACEMAKER INSERTION N/A 10/20/2011   Procedure: PERMANENT PACEMAKER INSERTION;  Surgeon: Thompson Grayer, MD;  Location: [facility] CATH LAB;  Service: Cardiovascular;  Laterality: N/A;  . REPLACEMENT TOTAL KNEE  11/2006   right knee  . TOTAL HIP ARTHROPLASTY  09/2010     Prior to Admission medications   Medication Sig Start Date End Date Taking? Authorizing Provider  acetaminophen (TYLENOL) 325 MG tablet Take 650 mg by mouth 3 (three) times daily as needed.    Historical Provider, MD  acidophilus (RISAQUAD) CAPS capsule Take 1 capsule by mouth 2 (two) times daily. 06/26/16   Bettey Costa, MD  aspirin EC 81 MG tablet Take 81 mg by mouth daily.    Historical Provider, MD  atorvastatin (LIPITOR) 80 MG tablet Take 80 mg by mouth daily.    Historical Provider, MD  beclomethasone (QVAR) 80 MCG/ACT inhaler Inhale 1 puff into the lungs 2 (two) times daily.    Historical Provider, MD  Calcium Carbonate-Vitamin D 600-400 MG-UNIT tablet Take 1 tablet by mouth daily.    Historical Provider, MD  Cholecalciferol 1000 units capsule Take 1,000 Units by mouth daily.    Historical Provider, MD  cyanocobalamin (,VITAMIN B-12,) 1000 MCG/ML injection Inject 1,000 mcg into the muscle every 30 (thirty) days. On the 16th of each month    Historical Provider, MD   diltiazem (CARDIZEM CD) 120 MG 24 hr capsule Take 1 capsule (120 mg total) by mouth daily. 09/21/16   Vaughan Basta, MD  furosemide (LASIX) 20 MG tablet Take 1 tablet (20 mg total) by mouth every other day. 04/06/16   Rogelia Mire, NP  gabapentin (NEURONTIN) 300 MG capsule Take 300 mg by mouth 2 (two) times daily.    Historical Provider, MD  HYDROcodone-acetaminophen (NORCO) 10-325 MG tablet Take 1 tablet by mouth every 4 (four) hours as needed for moderate pain. Reported on 05/30/2016 06/26/16   Bettey Costa, MD  ipratropium-albuterol (DUONEB) 0.5-2.5 (3) MG/3ML SOLN Take 3 mLs by nebulization every 6 (six) hours as needed.    Historical Provider, MD  lidocaine (LIDODERM) 5 % Place 1 patch onto the skin every 12 (twelve) hours. Remove & Discard patch within 12 hours or as directed by MD    Historical Provider, MD  magnesium hydroxide (MILK OF MAGNESIA) 400 MG/5ML suspension Take 10 mLs by mouth daily as needed for mild constipation.     Historical Provider, MD  metFORMIN (GLUCOPHAGE) 500 MG tablet Take 500 mg by mouth daily.    Historical Provider, MD  metoCLOPramide (REGLAN) 5 MG tablet Take 1 tablet (5 mg total) by mouth every 6 (six) hours as needed for nausea or vomiting. 09/21/16   Vaughan Basta, MD  nitroGLYCERIN (NITROSTAT) 0.4 MG SL tablet Place 1 tablet (0.4 mg total) under the tongue every 5 (five) minutes as needed for chest pain. 01/24/13   Minna Merritts, MD  ondansetron (ZOFRAN-ODT) 4 MG disintegrating tablet Take 1 tablet (4 mg total) by mouth every 8 (eight) hours as needed for nausea or vomiting. 10/07/16   Carrie Mew, MD  pantoprazole (PROTONIX) 40 MG tablet Take 40 mg by mouth 2 (two) times daily.      Historical Provider, MD  Potassium Chloride ER 20 MEQ TBCR Take 20 mEq by mouth 3 (three) times daily.     Historical Provider, MD  promethazine (PHENERGAN) 12.5 MG tablet Take 1 tablet (12.5 mg total) by mouth every 6 (six) hours as needed for nausea or  vomiting. 09/22/16   Vaughan Basta, MD  ranitidine (ZANTAC) 150 MG tablet Take 150 mg by mouth 2 (two) times daily.    Historical Provider, MD  sennosides-docusate sodium (SENOKOT-S) 8.6-50 MG tablet Take 2 tablets by mouth daily.    Historical Provider, MD  sucralfate (CARAFATE) 1 g tablet Take 1 g by mouth 4 (four) times daily.    Historical Provider, MD  traMADol (ULTRAM) 50 MG tablet Take 1 tablet (50 mg total) by mouth every 6 (six) hours as needed. 04/23/16 04/23/17  Earleen Newport, MD     Allergies Citalopram; Cymbalta [duloxetine hcl]; Imipramine; Proton pump inhibitors; and Venlafaxine   Family History  Problem Relation Age of Onset  . Heart disease Sister   . Heart disease Brother   . Heart disease Brother   . Heart disease Brother   . Diabetes Other   . Anesthesia problems Neg Hx   . Hypotension Neg Hx   . Malignant hyperthermia Neg Hx   . Pseudochol deficiency Neg Hx     Social History Social History  Substance Use Topics  . Smoking status: Never Smoker  . Smokeless tobacco: Never Used  . Alcohol use No    Review of Systems  Constitutional:   No fever or chills.  ENT:   No sore throat. No rhinorrhea. Cardiovascular:   No chest pain. Respiratory:   No dyspnea or cough. Gastrointestinal:   Negative for abdominal pain, vomiting and diarrhea.  Genitourinary:   Negative for dysuria or difficulty urinating.  10-point ROS otherwise negative.  ____________________________________________   PHYSICAL EXAM:  VITAL SIGNS: ED Triage Vitals  Enc Vitals Group     BP 10/07/16 1805 (!) 144/66     Pulse Rate 10/07/16 1800 83     Resp 10/07/16 1800 18     Temp 10/07/16 1800 98.2 F (36.8 C)     Temp Source 10/07/16 1800 Oral     SpO2 10/07/16 1800 96 %     Weight 10/07/16 1800 240 lb (108.9 kg)     Height 10/07/16 1800 5\' 4"  (1.626 m)  Head Circumference --      Peak Flow --      Pain Score 10/07/16 2007 7     Pain Loc --      Pain Edu? --       Excl. in [place]? --     Vital signs reviewed, nursing assessments reviewed.   Constitutional:   Alert and oriented. Well appearing and in no distress. Eyes:   No scleral icterus. No conjunctival pallor. PERRL. EOMI.  No nystagmus. ENT   Head:   Normocephalic and atraumatic.   Nose:   No congestion/rhinnorhea. No septal hematoma   Mouth/Throat:   MMM, no pharyngeal erythema. No peritonsillar mass.    Neck:   No stridor. No SubQ emphysema. No meningismus. Hematological/Lymphatic/Immunilogical:   No cervical lymphadenopathy. Cardiovascular:   RRR. Symmetric bilateral radial and DP pulses.  No murmurs.  Respiratory:   Normal respiratory effort without tachypnea nor retractions. Breath sounds are clear and equal bilaterally. No wheezes/rales/rhonchi. Gastrointestinal:   Soft and nontender. Non distended. There is no CVA tenderness.  No rebound, rigidity, or guarding. Genitourinary:   deferred Musculoskeletal:   Nontender with normal range of motion in all extremities. No joint effusions.  No lower extremity tenderness.  No edema. Neurologic:   Normal speech and language.  CN 2-10 normal. Motor grossly intact. No gross focal neurologic deficits are appreciated.  Skin:    Skin is warm, dry and intact. No rash noted.  No petechiae, purpura, or bullae.  ____________________________________________    LABS (pertinent positives/negatives) (all labs ordered are listed, but only abnormal results are displayed) Labs Reviewed  COMPREHENSIVE METABOLIC PANEL - Abnormal; Notable for the following:       Result Value   Potassium 2.2 (*)    Chloride 93 (*)    CO2 33 (*)    Glucose, Bld 168 (*)    AST 47 (*)    All other components within normal limits  CBC - Abnormal; Notable for the following:    RDW 16.2 (*)    All other components within normal limits  URINALYSIS COMPLETEWITH MICROSCOPIC ([facility] ONLY) - Abnormal; Notable for the following:    Color, Urine YELLOW (*)    APPearance  CLEAR (*)    Hgb urine dipstick 1+ (*)    Protein, ur 30 (*)    Leukocytes, UA TRACE (*)    Squamous Epithelial / LPF 0-5 (*)    All other components within normal limits  LIPASE, BLOOD  TROPONIN I   ____________________________________________   EKG  Interpreted by me Sinus rhythm rate of 95, normal axis and intervals. Normal QRS and ST segments. No acute ischemic changes. Left ventricular hypertrophy with repol abnormality. Multiple PVCs. ____________________________________________    RADIOLOGY  Chest x-ray unremarkable  ____________________________________________   PROCEDURES Procedures  ____________________________________________   INITIAL IMPRESSION / ASSESSMENT AND PLAN / ED COURSE  Pertinent labs & imaging results that were available during my care of the patient were reviewed by me and considered in my medical decision making (see chart for details).  Patient well appearing no acute distress. Due to age and comorbidities a screening workup was obtained with labs urinalysis chest x-ray which is all unremarkable except for incidentally noting a potassium level of 2.2. I don't think this is related to the nausea. She is not having a large amount of GI losses or acute renal failure. Her chemistry panel suggests a degree of contraction alkalosis. Patient was given Zofran here in the ED. Tolerated fluids, tolerated potassium  tablet. We'll discharge home with Zofran, follow up with primary care doctor this week..     Clinical Course   ____________________________________________   FINAL CLINICAL IMPRESSION(S) / ED DIAGNOSES  Final diagnoses:  Nausea  Hypokalemia       Portions of this note were generated with dragon dictation software. Dictation errors may occur despite best attempts at proofreading.    Carrie Mew, MD 10/07/16 2248

## 2016-10-11 ENCOUNTER — Emergency Department: Payer: Medicare Other

## 2016-10-11 ENCOUNTER — Observation Stay
Admission: EM | Admit: 2016-10-11 | Discharge: 2016-10-21 | Disposition: A | Discharge status: Skilled Nursing Facility | Payer: Medicare Other | Attending: Family Medicine | Admitting: Family Medicine

## 2016-10-11 DIAGNOSIS — R1011 Right upper quadrant pain: Secondary | ICD-10-CM

## 2016-10-11 DIAGNOSIS — R112 Nausea with vomiting, unspecified: Secondary | ICD-10-CM | POA: Diagnosis not present

## 2016-10-11 DIAGNOSIS — E872 Acidosis, unspecified: Secondary | ICD-10-CM

## 2016-10-11 DIAGNOSIS — E785 Hyperlipidemia, unspecified: Secondary | ICD-10-CM | POA: Diagnosis not present

## 2016-10-11 DIAGNOSIS — Z950 Presence of cardiac pacemaker: Secondary | ICD-10-CM | POA: Insufficient documentation

## 2016-10-11 DIAGNOSIS — R1013 Epigastric pain: Secondary | ICD-10-CM | POA: Diagnosis not present

## 2016-10-11 DIAGNOSIS — K219 Gastro-esophageal reflux disease without esophagitis: Secondary | ICD-10-CM | POA: Insufficient documentation

## 2016-10-11 DIAGNOSIS — Z953 Presence of xenogenic heart valve: Secondary | ICD-10-CM | POA: Insufficient documentation

## 2016-10-11 DIAGNOSIS — K760 Fatty (change of) liver, not elsewhere classified: Secondary | ICD-10-CM | POA: Insufficient documentation

## 2016-10-11 DIAGNOSIS — K449 Diaphragmatic hernia without obstruction or gangrene: Secondary | ICD-10-CM | POA: Diagnosis not present

## 2016-10-11 DIAGNOSIS — I110 Hypertensive heart disease with heart failure: Secondary | ICD-10-CM | POA: Diagnosis present

## 2016-10-11 DIAGNOSIS — Z951 Presence of aortocoronary bypass graft: Secondary | ICD-10-CM | POA: Insufficient documentation

## 2016-10-11 DIAGNOSIS — I2510 Atherosclerotic heart disease of native coronary artery without angina pectoris: Secondary | ICD-10-CM | POA: Diagnosis present

## 2016-10-11 DIAGNOSIS — I471 Supraventricular tachycardia: Secondary | ICD-10-CM | POA: Diagnosis not present

## 2016-10-11 DIAGNOSIS — I350 Nonrheumatic aortic (valve) stenosis: Secondary | ICD-10-CM | POA: Insufficient documentation

## 2016-10-11 DIAGNOSIS — Z6841 Body Mass Index (BMI) 40.0 and over, adult: Secondary | ICD-10-CM | POA: Insufficient documentation

## 2016-10-11 DIAGNOSIS — J45909 Unspecified asthma, uncomplicated: Secondary | ICD-10-CM | POA: Diagnosis not present

## 2016-10-11 DIAGNOSIS — Z23 Encounter for immunization: Secondary | ICD-10-CM | POA: Insufficient documentation

## 2016-10-11 DIAGNOSIS — Z515 Encounter for palliative care: Secondary | ICD-10-CM | POA: Insufficient documentation

## 2016-10-11 DIAGNOSIS — D1779 Benign lipomatous neoplasm of other sites: Secondary | ICD-10-CM | POA: Insufficient documentation

## 2016-10-11 DIAGNOSIS — I5032 Chronic diastolic (congestive) heart failure: Secondary | ICD-10-CM | POA: Diagnosis not present

## 2016-10-11 DIAGNOSIS — M1610 Unilateral primary osteoarthritis, unspecified hip: Secondary | ICD-10-CM | POA: Insufficient documentation

## 2016-10-11 DIAGNOSIS — I447 Left bundle-branch block, unspecified: Secondary | ICD-10-CM | POA: Insufficient documentation

## 2016-10-11 DIAGNOSIS — Z66 Do not resuscitate: Secondary | ICD-10-CM | POA: Insufficient documentation

## 2016-10-11 DIAGNOSIS — I442 Atrioventricular block, complete: Secondary | ICD-10-CM | POA: Diagnosis not present

## 2016-10-11 DIAGNOSIS — I25119 Atherosclerotic heart disease of native coronary artery with unspecified angina pectoris: Secondary | ICD-10-CM | POA: Diagnosis not present

## 2016-10-11 DIAGNOSIS — Z7982 Long term (current) use of aspirin: Secondary | ICD-10-CM | POA: Insufficient documentation

## 2016-10-11 DIAGNOSIS — I480 Paroxysmal atrial fibrillation: Secondary | ICD-10-CM | POA: Insufficient documentation

## 2016-10-11 DIAGNOSIS — E876 Hypokalemia: Secondary | ICD-10-CM | POA: Diagnosis present

## 2016-10-11 DIAGNOSIS — I4891 Unspecified atrial fibrillation: Secondary | ICD-10-CM

## 2016-10-11 DIAGNOSIS — Z96651 Presence of right artificial knee joint: Secondary | ICD-10-CM | POA: Insufficient documentation

## 2016-10-11 DIAGNOSIS — E860 Dehydration: Secondary | ICD-10-CM | POA: Diagnosis not present

## 2016-10-11 DIAGNOSIS — E1140 Type 2 diabetes mellitus with diabetic neuropathy, unspecified: Secondary | ICD-10-CM | POA: Insufficient documentation

## 2016-10-11 DIAGNOSIS — D649 Anemia, unspecified: Secondary | ICD-10-CM | POA: Insufficient documentation

## 2016-10-11 DIAGNOSIS — E119 Type 2 diabetes mellitus without complications: Secondary | ICD-10-CM

## 2016-10-11 DIAGNOSIS — Z888 Allergy status to other drugs, medicaments and biological substances status: Secondary | ICD-10-CM | POA: Insufficient documentation

## 2016-10-11 DIAGNOSIS — Z96641 Presence of right artificial hip joint: Secondary | ICD-10-CM | POA: Insufficient documentation

## 2016-10-11 DIAGNOSIS — M797 Fibromyalgia: Secondary | ICD-10-CM | POA: Insufficient documentation

## 2016-10-11 DIAGNOSIS — Z9889 Other specified postprocedural states: Secondary | ICD-10-CM | POA: Insufficient documentation

## 2016-10-11 DIAGNOSIS — Z7984 Long term (current) use of oral hypoglycemic drugs: Secondary | ICD-10-CM | POA: Insufficient documentation

## 2016-10-11 DIAGNOSIS — K8020 Calculus of gallbladder without cholecystitis without obstruction: Secondary | ICD-10-CM | POA: Insufficient documentation

## 2016-10-11 DIAGNOSIS — M6281 Muscle weakness (generalized): Secondary | ICD-10-CM

## 2016-10-11 DIAGNOSIS — R300 Dysuria: Secondary | ICD-10-CM | POA: Insufficient documentation

## 2016-10-11 LAB — COMPREHENSIVE METABOLIC PANEL
ALK PHOS: 79 U/L (ref 38–126)
ALT: 21 U/L (ref 14–54)
AST: 46 U/L — AB (ref 15–41)
Albumin: 3.7 g/dL (ref 3.5–5.0)
Anion gap: 10 (ref 5–15)
BILIRUBIN TOTAL: 0.6 mg/dL (ref 0.3–1.2)
BUN: 10 mg/dL (ref 6–20)
CALCIUM: 9.3 mg/dL (ref 8.9–10.3)
CO2: 33 mmol/L — ABNORMAL HIGH (ref 22–32)
CREATININE: 0.71 mg/dL (ref 0.44–1.00)
Chloride: 96 mmol/L — ABNORMAL LOW (ref 101–111)
Glucose, Bld: 155 mg/dL — ABNORMAL HIGH (ref 65–99)
Potassium: 2.3 mmol/L — CL (ref 3.5–5.1)
Sodium: 139 mmol/L (ref 135–145)
TOTAL PROTEIN: 7.9 g/dL (ref 6.5–8.1)

## 2016-10-11 LAB — CBC WITH DIFFERENTIAL/PLATELET
Basophils Absolute: 0 10*3/uL (ref 0–0.1)
Basophils Relative: 1 %
EOS PCT: 3 %
Eosinophils Absolute: 0.2 10*3/uL (ref 0–0.7)
HEMATOCRIT: 38.1 % (ref 35.0–47.0)
HEMOGLOBIN: 12.1 g/dL (ref 12.0–16.0)
LYMPHS ABS: 1.7 10*3/uL (ref 1.0–3.6)
LYMPHS PCT: 24 %
MCH: 26.4 pg (ref 26.0–34.0)
MCHC: 31.9 g/dL — ABNORMAL LOW (ref 32.0–36.0)
MCV: 82.8 fL (ref 80.0–100.0)
Monocytes Absolute: 0.8 10*3/uL (ref 0.2–0.9)
Monocytes Relative: 12 %
NEUTROS ABS: 4.3 10*3/uL (ref 1.4–6.5)
Neutrophils Relative %: 60 %
PLATELETS: 279 10*3/uL (ref 150–440)
RBC: 4.59 MIL/uL (ref 3.80–5.20)
RDW: 16.2 % — ABNORMAL HIGH (ref 11.5–14.5)
WBC: 7.1 10*3/uL (ref 3.6–11.0)

## 2016-10-11 LAB — TROPONIN I

## 2016-10-11 LAB — LACTIC ACID, PLASMA: Lactic Acid, Venous: 2.9 mmol/L (ref 0.5–1.9)

## 2016-10-11 LAB — LIPASE, BLOOD: LIPASE: 33 U/L (ref 11–51)

## 2016-10-11 MED ORDER — POTASSIUM CHLORIDE CRYS ER 20 MEQ PO TBCR
40.0000 meq | EXTENDED_RELEASE_TABLET | Freq: Once | ORAL | Status: AC
  Administered 2016-10-11: 40 meq via ORAL
  Filled 2016-10-11: qty 2

## 2016-10-11 MED ORDER — DILTIAZEM HCL 60 MG PO TABS
60.0000 mg | ORAL_TABLET | Freq: Once | ORAL | Status: AC
Start: 2016-10-11 — End: 2016-10-11
  Administered 2016-10-11: 60 mg via ORAL
  Filled 2016-10-11: qty 1

## 2016-10-11 MED ORDER — IOPAMIDOL (ISOVUE-300) INJECTION 61%
100.0000 mL | Freq: Once | INTRAVENOUS | Status: AC | PRN
  Administered 2016-10-11: 100 mL via INTRAVENOUS

## 2016-10-11 MED ORDER — PROMETHAZINE HCL 25 MG/ML IJ SOLN
12.5000 mg | Freq: Once | INTRAMUSCULAR | Status: AC
Start: 2016-10-11 — End: 2016-10-11
  Administered 2016-10-11: 12.5 mg via INTRAVENOUS
  Filled 2016-10-11: qty 1

## 2016-10-11 MED ORDER — POTASSIUM CHLORIDE 10 MEQ/100ML IV SOLN: 10.0000 meq | INTRAVENOUS | Status: DC

## 2016-10-11 MED ORDER — SODIUM CHLORIDE 0.9 % IV SOLN
30.0000 meq | Freq: Once | INTRAVENOUS | Status: AC
  Administered 2016-10-12: 30 meq via INTRAVENOUS
  Filled 2016-10-11: qty 15

## 2016-10-11 MED ORDER — POTASSIUM CHLORIDE 10 MEQ/100ML IV SOLN
10.0000 meq | INTRAVENOUS | Status: DC
  Administered 2016-10-11: 10 meq via INTRAVENOUS
  Filled 2016-10-11 (×2): qty 100

## 2016-10-11 MED ORDER — DILTIAZEM HCL 25 MG/5ML IV SOLN
10.0000 mg | Freq: Once | INTRAVENOUS | Status: AC
  Administered 2016-10-11: 10 mg via INTRAVENOUS
  Filled 2016-10-11: qty 5

## 2016-10-11 MED ORDER — MAGNESIUM SULFATE 2 GM/50ML IV SOLN
2.0000 g | Freq: Once | INTRAVENOUS | Status: AC
  Administered 2016-10-11: 2 g via INTRAVENOUS
  Filled 2016-10-11: qty 50

## 2016-10-11 NOTE — H&P (Signed)
[facility] NAME: Michele Schultz    MR#:  [id number]  DATE OF BIRTH:  [birth year]  DATE OF ADMISSION:  10/11/2016  PRIMARY CARE PHYSICIAN: [name], MD   REQUESTING/REFERRING PHYSICIAN: Quentin Cornwall, MD  CHIEF COMPLAINT:   Chief Complaint  Patient presents with  . Nausea  . Weakness    HISTORY OF PRESENT ILLNESS:  Michele Schultz  is a 82 y.o. female who presents with Nausea and weakness, on arrival to the ED she was in A. fib with RVR. She responded well to medications in the ED to control her heart rate, but had persistent nausea weakness, and clinical dehydration. Hospitals were called for admission and further treatment.  PAST MEDICAL HISTORY:   Past Medical History:  Diagnosis Date  . Anemia   . Aortic stenosis, severe    a. s/p Magna Ease pericardial tissue valve size 21 mm replacement in 10/2011 for severe AS 11/12; b. echo 07/2015: EF 55-60% mod concentric LVH, GR1DD, LA mildly dilated, PASP 45 mm Hg  . Atrial tachycardia, paroxysmal (HCC)    a. with rate related LBBB.  . Cardiac pacemaker -st Judes    11/12  . Chronic diastolic heart failure ([place])    a. echo 2014: EF 55-60%, no RWMA, GR1DD, PASP 47 mm Hg; b. echo 07/2015: EF 55-60% mod concentric LVH, GR1DD, LA mildly dilated, PASP 45 mm Hg  . Complete heart block [facility])    a. s/p St Jude PPM 10/2011 (Ser # [id number]).  . Coronary artery disease    a. s/p 2v CABG 11/12 (VG-LAD, VG-OM1); b. Lexiscan 08/2015: low risk, no ischemia, EF 55-65%.  . Enthesopathy of hip region   . Esophageal reflux    followed by Dr.Seigal. stabilized with a combination of Nexium and Zantac  . Fibromyalgia   . HLD (hyperlipidemia)   . Hypertensive heart disease   . Knee joint replacement by other means   . Morbid obesity ([place])   . Neuralgia, neuritis, and radiculitis, unspecified   . Neuropathy ([place])   . Onychia and paronychia of toe   . Osteoarthrosis, unspecified  whether generalized or localized, pelvic region and thigh    mainly in her back and knees  . PAF (paroxysmal atrial fibrillation) ([place])    a. brief episodes of AF previously noted on device interrogations.  Marland Kitchen RAD (reactive airway disease)    a. chronic SOB  . Spinal stenosis   . Stress incontinence, female    followed by Dr.Cope  . Type II or unspecified type diabetes mellitus without mention of complication, not stated as uncontrolled    a. pt. reports that she is borderline   . Wide-complex tachycardia ([place])    a. Noted 4/10 - brief episode in ED. Noted again 4/21 in clinic->felt most likely to be atrial tach.    PAST SURGICAL HISTORY:   Past Surgical History:  Procedure Laterality Date  . AORTIC VALVE REPLACEMENT  10/17/2011   Procedure: AORTIC VALVE REPLACEMENT (AVR);  Surgeon: Melrose Nakayama, MD;  Location: [facility];  Service: Open Heart Surgery;  Laterality: N/A;  . CARDIAC CATHETERIZATION  08/2009   50% stenosis distal left main, 50% stenosis ostial left circumflex.   Marland Kitchen CARDIAC CATHETERIZATION     at [facility]  . CORONARY ARTERY BYPASS GRAFT  10/17/2011   Procedure: CORONARY ARTERY BYPASS GRAFTING (CABG);  Surgeon: Melrose Nakayama, MD;  Location: [facility];  Service: Open Heart Surgery;  Laterality: N/A;  Times  Two, using right leg greater saphenous vein harvested endoscopically  . EYE SURGERY     IOL/ after cataracts removed- bilateral   . NASAL SINUS SURGERY  2009  . PERMANENT PACEMAKER INSERTION N/A 10/20/2011   Procedure: PERMANENT PACEMAKER INSERTION;  Surgeon: Thompson Grayer, MD;  Location: [facility] CATH LAB;  Service: Cardiovascular;  Laterality: N/A;  . REPLACEMENT TOTAL KNEE  11/2006   right knee  . TOTAL HIP ARTHROPLASTY  09/2010    SOCIAL HISTORY:   Social History  Substance Use Topics  . Smoking status: Never Smoker  . Smokeless tobacco: Never Used  . Alcohol use No    FAMILY HISTORY:   Family History  Problem Relation Age of Onset  . Heart disease Sister    . Heart disease Brother   . Heart disease Brother   . Heart disease Brother   . Diabetes Other   . Anesthesia problems Neg Hx   . Hypotension Neg Hx   . Malignant hyperthermia Neg Hx   . Pseudochol deficiency Neg Hx     DRUG ALLERGIES:   Allergies  Allergen Reactions  . Citalopram Other (See Comments)    Reaction:  Altered mental status   . Cymbalta [Duloxetine Hcl] Other (See Comments)    Reaction:  Sedative for pt   . Imipramine Other (See Comments)    Reaction:  Unknown   . Proton Pump Inhibitors Other (See Comments)    Reaction:  Unknown   . Venlafaxine Nausea And Vomiting and Other (See Comments)    Reaction:  Dizziness     MEDICATIONS AT HOME:   Prior to Admission medications   Medication Sig Start Date End Date Taking? Authorizing Provider  acetaminophen (TYLENOL) 325 MG tablet Take 650 mg by mouth 3 (three) times daily as needed.   Yes Historical Provider, MD  acidophilus (RISAQUAD) CAPS capsule Take 1 capsule by mouth 2 (two) times daily. 06/26/16  Yes Bettey Costa, MD  aspirin EC 81 MG tablet Take 81 mg by mouth daily.   Yes Historical Provider, MD  atorvastatin (LIPITOR) 80 MG tablet Take 80 mg by mouth daily.   Yes Historical Provider, MD  Calcium Carbonate-Vitamin D 600-400 MG-UNIT tablet Take 1 tablet by mouth daily.   Yes Historical Provider, MD  Cholecalciferol 1000 units capsule Take 1,000 Units by mouth daily.   Yes Historical Provider, MD  furosemide (LASIX) 20 MG tablet Take 1 tablet (20 mg total) by mouth every other day. 04/06/16  Yes Rogelia Mire, NP  HYDROcodone-acetaminophen (NORCO) 10-325 MG tablet Take 1 tablet by mouth every 4 (four) hours as needed for moderate pain. Reported on 05/30/2016 06/26/16  Yes Sital Mody, MD  magnesium hydroxide (MILK OF MAGNESIA) 400 MG/5ML suspension Take 10 mLs by mouth daily as needed for mild constipation.    Yes Historical Provider, MD  metoCLOPramide (REGLAN) 5 MG tablet Take 1 tablet (5 mg total) by mouth every 6  (six) hours as needed for nausea or vomiting. 09/21/16  Yes Vaughan Basta, MD  nitroGLYCERIN (NITROSTAT) 0.4 MG SL tablet Place 1 tablet (0.4 mg total) under the tongue every 5 (five) minutes as needed for chest pain. 01/24/13  Yes Minna Merritts, MD  ondansetron (ZOFRAN-ODT) 4 MG disintegrating tablet Take 1 tablet (4 mg total) by mouth every 8 (eight) hours as needed for nausea or vomiting. 10/07/16  Yes Carrie Mew, MD  traMADol (ULTRAM) 50 MG tablet Take 1 tablet (50 mg total) by mouth every 6 (six) hours as needed. 04/23/16  04/23/17 Yes Earleen Newport, MD  diltiazem (CARDIZEM CD) 120 MG 24 hr capsule Take 1 capsule (120 mg total) by mouth daily. Patient not taking: Reported on 10/11/2016 09/21/16   Vaughan Basta, MD  promethazine (PHENERGAN) 12.5 MG tablet Take 1 tablet (12.5 mg total) by mouth every 6 (six) hours as needed for nausea or vomiting. Patient not taking: Reported on 10/11/2016 09/22/16   Vaughan Basta, MD    REVIEW OF SYSTEMS:  Review of Systems  Constitutional: Positive for malaise/fatigue. Negative for chills, fever and weight loss.  HENT: Negative for ear pain, hearing loss and tinnitus.   Eyes: Negative for blurred vision, double vision, pain and redness.  Respiratory: Negative for cough, hemoptysis and shortness of breath.   Cardiovascular: Negative for chest pain, palpitations, orthopnea and leg swelling.  Gastrointestinal: Positive for nausea. Negative for abdominal pain, constipation, diarrhea and vomiting.  Genitourinary: Negative for dysuria, frequency and hematuria.  Musculoskeletal: Negative for back pain, joint pain and neck pain.  Skin:       No acne, rash, or lesions  Neurological: Negative for dizziness, tremors, focal weakness and weakness.  Endo/Heme/Allergies: Negative for polydipsia. Does not bruise/bleed easily.  Psychiatric/Behavioral: Negative for depression. The patient is not nervous/anxious and does not have insomnia.       VITAL SIGNS:   Vitals:   10/11/16 2112 10/11/16 2116 10/11/16 2225 10/11/16 2326  BP: (!) 157/92  (!) 152/69 (!) 153/69  Pulse: 89  95 85  Resp: 16  14 16   Temp: 98.6 F (37 C)     TempSrc: Oral     SpO2: 99%  94% 94%  Weight:  108.9 kg (240 lb)    Height:  5\' 4"  (1.626 m)     Wt Readings from Last 3 Encounters:  10/11/16 108.9 kg (240 lb)  10/07/16 108.9 kg (240 lb)  09/22/16 120.9 kg (266 lb 7.3 oz)    PHYSICAL EXAMINATION:  Physical Exam  Vitals reviewed. Constitutional: She is oriented to person, place, and time. She appears well-developed and well-nourished. No distress.  HENT:  Head: Normocephalic and atraumatic.  Dry mucous membranes  Eyes: Conjunctivae and EOM are normal. Pupils are equal, round, and reactive to light. No scleral icterus.  Neck: Normal range of motion. Neck supple. No JVD present. No thyromegaly present.  Cardiovascular: Normal rate, regular rhythm and intact distal pulses.  Exam reveals no gallop and no friction rub.   No murmur heard. Respiratory: Effort normal and breath sounds normal. No respiratory distress. She has no wheezes. She has no rales.  GI: Soft. Bowel sounds are normal. She exhibits no distension. There is no tenderness.  Musculoskeletal: Normal range of motion. She exhibits no edema.  No arthritis, no gout  Lymphadenopathy:    She has no cervical adenopathy.  Neurological: She is alert and oriented to person, place, and time. No cranial nerve deficit.  No dysarthria, no aphasia  Skin: Skin is warm and dry. No rash noted. No erythema.  Psychiatric: She has a normal mood and affect. Her behavior is normal. Judgment and thought content normal.    LABORATORY PANEL:   CBC  Recent Labs Lab 10/11/16 2145  WBC 7.1  HGB 12.1  HCT 38.1  PLT 279   ------------------------------------------------------------------------------------------------------------------  Chemistries   Recent Labs Lab 10/11/16 2145  NA 139  K  2.3*  CL 96*  CO2 33*  GLUCOSE 155*  BUN 10  CREATININE 0.71  CALCIUM 9.3  AST 46*  ALT 21  ALKPHOS  79  BILITOT 0.6   ------------------------------------------------------------------------------------------------------------------  Cardiac Enzymes  Recent Labs Lab 10/11/16 2145  TROPONINI <0.03   ------------------------------------------------------------------------------------------------------------------  RADIOLOGY:  Dg Chest 2 View  Result Date: 10/11/2016 CLINICAL DATA:  Nausea and weakness EXAM: CHEST  2 VIEW COMPARISON:  10/07/2016 FINDINGS: Left-sided dual lead pacing device with leads over the right atrium and right ventricle. Post sternotomy changes. Possible tiny right pleural effusion. Stable enlarged cardiomediastinal none with tortuous aorta. Hazy bibasilar opacity likely attributable to overlying soft tissues. No focal consolidation. No pneumothorax. IMPRESSION: 1. Stable degree of cardiomegaly.  No overt failure. 2. Possible tiny right pleural effusion 3. No focal consolidation Electronically Signed   By: Donavan Foil M.D.   On: 10/11/2016 21:43   Ct Abdomen Pelvis W Contrast  Result Date: 10/11/2016 CLINICAL DATA:  Epigastric pain nausea and weakness EXAM: CT ABDOMEN AND PELVIS WITH CONTRAST TECHNIQUE: Multidetector CT imaging of the abdomen and pelvis was performed using the standard protocol following bolus administration of intravenous contrast. CONTRAST:  122mL ISOVUE-300 IOPAMIDOL (ISOVUE-300) INJECTION 61% COMPARISON:  09/17/2016 FINDINGS: Lower chest: Hazy density within the lingula could reflect atelectasis or a mild infiltrate. No effusion. Mild subpleural opacities at the right middle lobe are unchanged, possible mild fibrosis. Heart is enlarged. Partially visualized pacer leads. Calcified mitral annulus. Hepatobiliary: Diffuse decreased density of the liver, consistent with fatty infiltration. Liver is slightly enlarged at 17 cm craniocaudad. No focal  hepatic abnormalities. Small amount of high density material is present within the gallbladder, which may reflect small stones or sludge. No biliary dilatation. Pancreas: Unremarkable. No pancreatic ductal dilatation or surrounding inflammatory changes.Choose Spleen: Normal in size without focal abnormality.Small accessory splenule. Adrenals/Urinary Tract: Adrenal glands within normal limits. Punctate nonobstructing stones within the kidneys. Cortical hypodense lesion within the left kidney too small to further characterize, grossly unchanged. Small amount of air within the bladder. Stomach/Bowel: The stomach is nondilated and contains possible small pill fragment. Duodenum lipoma unchanged. No dilated small bowel to suggest an obstruction. Mild colon diverticular disease without bowel inflammation. Transverse lie of the cecum. Appendix is normal. Vascular/Lymphatic: Atherosclerotic vascular calcifications but no aneurysm. No significantly enlarged lymph nodes. Reproductive: Uterus and bilateral adnexa are unremarkable. Other: No free air or free fluid Musculoskeletal: Patient is status post right hip replacement. Multilevel degenerative changes of the spine with mild anterior listhesis of L4 on L5 and mild retrolisthesis of L5 on S1. Vacuum discs at L4-L5 and L5-S1. No acute osseous abnormality. IMPRESSION: 1. Hazy density in the lingula could reflect atelectasis or mild infiltrate. 2. Slightly enlarged fatty liver. 3. Small amount of stones or high density sludge within the gallbladder. No wall thickening. No biliary dilatation. 4. Punctate nonobstructing stones within both kidneys. 5. No evidence for small bowel obstruction. 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 Normal appendix. Electronically Signed   By: Donavan Foil M.D.   On: 10/11/2016 23:22    EKG:   Orders placed or performed during the hospital encounter of 10/11/16  . ED EKG  . ED EKG  . EKG 12-Lead  . EKG 12-Lead  . EKG 12-Lead  . EKG 12-Lead     IMPRESSION AND PLAN:  Principal Problem:   Atrial fibrillation with RVR (HCC) - controlled with IV diltiazem in the ED. Will monitor telemetry, treat as needed to keep her heart rate below 110. Active Problems:   Hypokalemia - potassium was 2.3 in the ED. We will replete and continue to monitor.   HTN (hypertension) - continue home meds  Chronic diastolic heart failure (HCC) - continue home meds   Coronary artery disease - continue home meds  All the records are reviewed and case discussed with ED provider. Management plans discussed with the patient and/or family.  DVT PROPHYLAXIS: SubQ lovenox  GI PROPHYLAXIS: None  ADMISSION STATUS: Observation  CODE STATUS: DNR Code Status History    Date Active Date Inactive Code Status Order ID Comments User Context   09/19/2016 10:52 AM 09/22/2016  8:50 PM DNR [id number]  Knox Royalty, NP Inpatient   09/17/2016  5:57 PM 09/19/2016 10:52 AM Full Code [id number]  Theodoro Grist, MD Inpatient   06/23/2016  8:20 PM 06/26/2016  1:26 PM Full Code [id number]  Gladstone Lighter, MD Inpatient   06/16/2016  8:35 PM 06/21/2016 10:09 PM Full Code [id number]  Lytle Butte, MD ED   03/13/2016  8:15 PM 03/14/2016  9:02 PM Full Code [id number]  Henreitta Leber, MD Inpatient   09/13/2015  6:51 PM 09/18/2015  7:31 PM Full Code [id number]  Dustin Flock, MD Inpatient   10/20/2011  1:40 PM 10/22/2011  1:27 PM Full Code [id number]  Tiajuana Amass, RN Inpatient    Questions for Most Recent Historical Code Status (Order [id number])    Question Answer Comment   In the event of cardiac or respiratory ARREST Do not call a "code blue"    In the event of cardiac or respiratory ARREST Do not perform Intubation, CPR, defibrillation or ACLS    In the event of cardiac or respiratory ARREST Use medication by any route, position, wound care, and other measures to relive pain and suffering. May use oxygen, suction and manual treatment of airway obstruction as needed for comfort.          Advance Directive Documentation   Flowsheet Row Most Recent Value  Type of Advance Directive  Out of facility DNR (pink MOST or yellow form)  Pre-existing out of facility DNR order (yellow form or pink MOST form)  No data  "MOST" Form in Place?  No data      TOTAL TIME TAKING CARE OF THIS PATIENT: 40 minutes.    Manha Amato FIELDING 10/11/2016, 11:44 PM  [facility]  Office  [phone]  CC: Primary care physician; [name], MD

## 2016-10-11 NOTE — ED Notes (Signed)
Dr Quentin Cornwall notified of elevated Lactic level as reported by lab.

## 2016-10-11 NOTE — ED Notes (Signed)
Dr Quentin Cornwall made aware of critical K+ value as reported by lab.

## 2016-10-11 NOTE — ED Notes (Signed)
Patient transported to CT at this time. 

## 2016-10-11 NOTE — ED Triage Notes (Signed)
Pt presents to ED from [facility] with c/o nausea and weakness. Pt denies any actual episodes of emesis. EMS reports pt recently seen here for same. EMS also reports pt with h/x of Afib, but not currently taking medication for such. Pt arrives A&O, in NAD, with respirations even, regular and unlabored.

## 2016-10-11 NOTE — ED Provider Notes (Signed)
[facility] Emergency Department Provider Note    First MD Initiated Contact with Patient 10/11/16 2118     (approximate)  I have reviewed the triage vital signs and the nursing notes.   HISTORY  Chief Complaint Nausea and Weakness    HPI Michele Schultz is a 82 y.o. female 1 out of this department with significant multiple comorbidities and is a DO NOT RESUSCITATE on palliative care presents with persistent nausea and inability to keep medications down. She states that she is having epigastric pain and nausea for the past 2 days. States she's also feeling now dehydrated and weak. Denies any fevers. No shortness of breath or chest pain. No diarrhea. Has had some dysuria.   Past Medical History:  Diagnosis Date  . Anemia   . Aortic stenosis, severe    a. s/p Magna Ease pericardial tissue valve size 21 mm replacement in 10/2011 for severe AS 11/12; b. echo 07/2015: EF 55-60% mod concentric LVH, GR1DD, LA mildly dilated, PASP 45 mm Hg  . Atrial tachycardia, paroxysmal (HCC)    a. with rate related LBBB.  . Cardiac pacemaker -st Judes    11/12  . Chronic diastolic heart failure ([place])    a. echo 2014: EF 55-60%, no RWMA, GR1DD, PASP 47 mm Hg; b. echo 07/2015: EF 55-60% mod concentric LVH, GR1DD, LA mildly dilated, PASP 45 mm Hg  . Complete heart block [facility])    a. s/p St Jude PPM 10/2011 (Ser # [id number]).  . Coronary artery disease    a. s/p 2v CABG 11/12 (VG-LAD, VG-OM1); b. Lexiscan 08/2015: low risk, no ischemia, EF 55-65%.  . Enthesopathy of hip region   . Esophageal reflux    followed by Dr.Seigal. stabilized with a combination of Nexium and Zantac  . Fibromyalgia   . HLD (hyperlipidemia)   . Hypertensive heart disease   . Knee joint replacement by other means   . Morbid obesity ([place])   . Neuralgia, neuritis, and radiculitis, unspecified   . Neuropathy ([place])   . Onychia and paronychia of toe   . Osteoarthrosis, unspecified whether generalized or  localized, pelvic region and thigh    mainly in her back and knees  . PAF (paroxysmal atrial fibrillation) ([place])    a. brief episodes of AF previously noted on device interrogations.  Marland Kitchen RAD (reactive airway disease)    a. chronic SOB  . Spinal stenosis   . Stress incontinence, female    followed by Dr.Cope  . Type II or unspecified type diabetes mellitus without mention of complication, not stated as uncontrolled    a. pt. reports that she is borderline   . Wide-complex tachycardia ([place])    a. Noted 4/10 - brief episode in ED. Noted again 4/21 in clinic->felt most likely to be atrial tach.   Family History  Problem Relation Age of Onset  . Heart disease Sister   . Heart disease Brother   . Heart disease Brother   . Heart disease Brother   . Diabetes Other   . Anesthesia problems Neg Hx   . Hypotension Neg Hx   . Malignant hyperthermia Neg Hx   . Pseudochol deficiency Neg Hx    Past Surgical History:  Procedure Laterality Date  . AORTIC VALVE REPLACEMENT  10/17/2011   Procedure: AORTIC VALVE REPLACEMENT (AVR);  Surgeon: Melrose Nakayama, MD;  Location: [place];  Service: Open Heart Surgery;  Laterality: N/A;  . CARDIAC CATHETERIZATION  08/2009   50% stenosis  distal left main, 50% stenosis ostial left circumflex.   Marland Kitchen CARDIAC CATHETERIZATION     at [facility]  . CORONARY ARTERY BYPASS GRAFT  10/17/2011   Procedure: CORONARY ARTERY BYPASS GRAFTING (CABG);  Surgeon: Melrose Nakayama, MD;  Location: [place];  Service: Open Heart Surgery;  Laterality: N/A;  Times Two, using right leg greater saphenous vein harvested endoscopically  . EYE SURGERY     IOL/ after cataracts removed- bilateral   . NASAL SINUS SURGERY  2009  . PERMANENT PACEMAKER INSERTION N/A 10/20/2011   Procedure: PERMANENT PACEMAKER INSERTION;  Surgeon: Thompson Grayer, MD;  [facility] C[facility];  Service: Cardiovascular;  Laterality: N/A;  . REPLACEMENT TOTAL KNEE  11/2006   right knee  . TOTAL HIP ARTHROPLASTY  09/2010    Patient Active Problem List   Diagnosis Date Noted  . DNR (do not resuscitate) 09/19/2016  . Palliative care by specialist 09/19/2016  . Muscle weakness (generalized)   . Atrial tachycardia ([place])   . Diarrhea 09/17/2016  . Dehydration 09/17/2016  . Metabolic acidosis [id number]  . Headache due to trauma 07/03/2016  . Hypertensive heart disease   . Wide-complex tachycardia ([place])   . Pain in the chest   . Emesis   . Weakness of both legs   . Coronary artery disease involving coronary bypass graft of native heart with unspecified angina pectoris   . S/P AVR (aortic valve replacement)   . Falls frequently   . HLD (hyperlipidemia)   . Fibromyalgia   . RAD (reactive airway disease)   . Morbid obesity ([place])   . Aortic stenosis, severe   . Chronic pain 06/17/2014  . Pain in the muscles 06/19/2013  . Chest pain 06/19/2013  . Wheezing 06/19/2013  . Weakness generalized 06/03/2013  . Dyspnea 01/27/2013  . Nausea 03/05/2012  . Constipation 03/05/2012  . Atrial fibrillation-non sustained 02/09/2012  . Cardiac pacemaker -st Judes   . Chronic diastolic heart failure ([place])   . Coronary artery disease   . Long term (current) use of anticoagulants 01/03/2012  . S/P CABG x 2 11/14/2011  . S/P aortic valve replacement with porcine valve 11/14/2011  . Back pain 11/14/2011  . Complete heart block ([place]) 10/20/2011  . HTN (hypertension) 09/15/2011  . Spinal stenosis 03/22/2011  . Fatigue 03/22/2011  . Hyperlipidemia 03/22/2011      Prior to Admission medications   Medication Sig Start Date End Date Taking? Authorizing Provider  acetaminophen (TYLENOL) 325 MG tablet Take 650 mg by mouth 3 (three) times daily as needed.    Historical Provider, MD  acidophilus (RISAQUAD) CAPS capsule Take 1 capsule by mouth 2 (two) times daily. 06/26/16   Bettey Costa, MD  aspirin EC 81 MG tablet Take 81 mg by mouth daily.    Historical Provider, MD  atorvastatin (LIPITOR) 80 MG tablet Take 80 mg by mouth  daily.    Historical Provider, MD  beclomethasone (QVAR) 80 MCG/ACT inhaler Inhale 1 puff into the lungs 2 (two) times daily.    Historical Provider, MD  Calcium Carbonate-Vitamin D 600-400 MG-UNIT tablet Take 1 tablet by mouth daily.    Historical Provider, MD  Cholecalciferol 1000 units capsule Take 1,000 Units by mouth daily.    Historical Provider, MD  cyanocobalamin (,VITAMIN B-12,) 1000 MCG/ML injection Inject 1,000 mcg into the muscle every 30 (thirty) days. On the 16th of each month    Historical Provider, MD  diltiazem (CARDIZEM CD) 120 MG 24 hr capsule Take 1 capsule (120 mg  total) by mouth daily. 09/21/16   Vaughan Basta, MD  furosemide (LASIX) 20 MG tablet Take 1 tablet (20 mg total) by mouth every other day. 04/06/16   Rogelia Mire, NP  gabapentin (NEURONTIN) 300 MG capsule Take 300 mg by mouth 2 (two) times daily.    Historical Provider, MD  HYDROcodone-acetaminophen (NORCO) 10-325 MG tablet Take 1 tablet by mouth every 4 (four) hours as needed for moderate pain. Reported on 05/30/2016 06/26/16   Bettey Costa, MD  ipratropium-albuterol (DUONEB) 0.5-2.5 (3) MG/3ML SOLN Take 3 mLs by nebulization every 6 (six) hours as needed.    Historical Provider, MD  lidocaine (LIDODERM) 5 % Place 1 patch onto the skin every 12 (twelve) hours. Remove & Discard patch within 12 hours or as directed by MD    Historical Provider, MD  magnesium hydroxide (MILK OF MAGNESIA) 400 MG/5ML suspension Take 10 mLs by mouth daily as needed for mild constipation.     Historical Provider, MD  metFORMIN (GLUCOPHAGE) 500 MG tablet Take 500 mg by mouth daily.    Historical Provider, MD  metoCLOPramide (REGLAN) 5 MG tablet Take 1 tablet (5 mg total) by mouth every 6 (six) hours as needed for nausea or vomiting. 09/21/16   Vaughan Basta, MD  nitroGLYCERIN (NITROSTAT) 0.4 MG SL tablet Place 1 tablet (0.4 mg total) under the tongue every 5 (five) minutes as needed for chest pain. 01/24/13   Minna Merritts,  MD  ondansetron (ZOFRAN-ODT) 4 MG disintegrating tablet Take 1 tablet (4 mg total) by mouth every 8 (eight) hours as needed for nausea or vomiting. 10/07/16   Carrie Mew, MD  pantoprazole (PROTONIX) 40 MG tablet Take 40 mg by mouth 2 (two) times daily.      Historical Provider, MD  Potassium Chloride ER 20 MEQ TBCR Take 20 mEq by mouth 3 (three) times daily.     Historical Provider, MD  promethazine (PHENERGAN) 12.5 MG tablet Take 1 tablet (12.5 mg total) by mouth every 6 (six) hours as needed for nausea or vomiting. 09/22/16   Vaughan Basta, MD  ranitidine (ZANTAC) 150 MG tablet Take 150 mg by mouth 2 (two) times daily.    Historical Provider, MD  sennosides-docusate sodium (SENOKOT-S) 8.6-50 MG tablet Take 2 tablets by mouth daily.    Historical Provider, MD  sucralfate (CARAFATE) 1 g tablet Take 1 g by mouth 4 (four) times daily.    Historical Provider, MD  traMADol (ULTRAM) 50 MG tablet Take 1 tablet (50 mg total) by mouth every 6 (six) hours as needed. 04/23/16 04/23/17  Earleen Newport, MD    Allergies Citalopram; Cymbalta [duloxetine hcl]; Imipramine; Proton pump inhibitors; and Venlafaxine    Social History Social History  Substance Use Topics  . Smoking status: Never Smoker  . Smokeless tobacco: Never Used  . Alcohol use No    Review of Systems Patient denies headaches, rhinorrhea, blurry vision, numbness, shortness of breath, chest pain, edema, cough, abdominal pain, nausea, vomiting, diarrhea, dysuria, fevers, rashes or hallucinations unless otherwise stated above in HPI. ____________________________________________   PHYSICAL EXAM:  VITAL SIGNS: Vitals:   10/11/16 2112  BP: (!) 157/92  Pulse: 89  Resp: 16  Temp: 98.6 F (37 C)    Constitutional: Obese chronically ill female in no acute distress Eyes: Conjunctivae are normal. PERRL. EOMI. Head: Atraumatic. Nose: No congestion/rhinnorhea. Mouth/Throat: Mucous membranes are moist.  Oropharynx  non-erythematous. Neck: No stridor. Painless ROM. No cervical spine tenderness to palpation Hematological/Lymphatic/Immunilogical: No cervical lymphadenopathy. Cardiovascular: Tachycardic,  irregularly irregular. Grossly normal heart sounds.  Good peripheral circulation. Respiratory: Normal respiratory effort.  No retractions. Lungs CTAB. Gastrointestinal: Markedly obese with mild epigastric tenderness to palpation No distention. No abdominal bruits. No CVA tenderness. Genitourinary: Musculoskeletal: No lower extremity tenderness nor edema.  No joint effusions. Neurologic:  Normal speech and language. No gross focal neurologic deficits are appreciated. No gait instability. Skin:  Skin is warm, dry and intact. No rash noted.   ____________________________________________   LABS (all labs ordered are listed, but only abnormal results are displayed)  Results for orders placed or performed during the hospital encounter of 10/11/16 (from the past 24 hour(s))  CBC with Differential/Platelet     Status: Abnormal   Collection Time: 10/11/16  9:45 PM  Result Value Ref Range   WBC 7.1 3.6 - 11.0 K/uL   RBC 4.59 3.80 - 5.20 MIL/uL   Hemoglobin 12.1 12.0 - 16.0 g/dL   HCT 38.1 35.0 - 47.0 %   MCV 82.8 80.0 - 100.0 fL   MCH 26.4 26.0 - 34.0 pg   MCHC 31.9 (L) 32.0 - 36.0 g/dL   RDW 16.2 (H) 11.5 - 14.5 %   Platelets 279 150 - 440 K/uL   Neutrophils Relative % 60 %   Neutro Abs 4.3 1.4 - 6.5 K/uL   Lymphocytes Relative 24 %   Lymphs Abs 1.7 1.0 - 3.6 K/uL   Monocytes Relative 12 %   Monocytes Absolute 0.8 0.2 - 0.9 K/uL   Eosinophils Relative 3 %   Eosinophils Absolute 0.2 0 - 0.7 K/uL   Basophils Relative 1 %   Basophils Absolute 0.0 0 - 0.1 K/uL  Comprehensive metabolic panel     Status: Abnormal   Collection Time: 10/11/16  9:45 PM  Result Value Ref Range   Sodium 139 135 - 145 mmol/L   Potassium 2.3 (LL) 3.5 - 5.1 mmol/L   Chloride 96 (L) 101 - 111 mmol/L   CO2 33 (H) 22 - 32  mmol/L   Glucose, Bld 155 (H) 65 - 99 mg/dL   BUN 10 6 - 20 mg/dL   Creatinine, Ser 0.71 0.44 - 1.00 mg/dL   Calcium 9.3 8.9 - 10.3 mg/dL   Total Protein 7.9 6.5 - 8.1 g/dL   Albumin 3.7 3.5 - 5.0 g/dL   AST 46 (H) 15 - 41 U/L   ALT 21 14 - 54 U/L   Alkaline Phosphatase 79 38 - 126 U/L   Total Bilirubin 0.6 0.3 - 1.2 mg/dL   GFR calc non Af Amer >60 >60 mL/min   GFR calc Af Amer >60 >60 mL/min   Anion gap 10 5 - 15  Lipase, blood     Status: None   Collection Time: 10/11/16  9:45 PM  Result Value Ref Range   Lipase 33 11 - 51 U/L  Troponin I     Status: None   Collection Time: 10/11/16  9:45 PM  Result Value Ref Range   Troponin I <0.03 <0.03 ng/mL  Lactic acid, plasma     Status: Abnormal   Collection Time: 10/11/16  9:45 PM  Result Value Ref Range   Lactic Acid, Venous 2.9 (HH) 0.5 - 1.9 mmol/L   ____________________________________________  EKG My review and personal interpretation at Time: 21:48   Indication: tachycardia  Rate: 130  Rhythm: aflutter with RVR Axis: normal Other: aflutter with occasional paced complexes non specific st changes ____________________________________________  RADIOLOGY  I personally reviewed all radiographic images ordered to evaluate for  the above acute complaints and reviewed radiology reports and findings.  These findings were personally discussed with the patient.  Please see medical record for radiology report. ____________________________________________   PROCEDURES  Procedure(s) performed: none Procedures    Critical Care performed: no ____________________________________________   INITIAL IMPRESSION / ASSESSMENT AND PLAN / ED COURSE  Pertinent labs & imaging results that were available during my care of the patient were reviewed by me and considered in my medical decision making (see chart for details).  DDX: sepsis, dehydration, gastroparesis, uti, obstruction, medication noncompliance  Michele Schultz is a 82 y.o.  who presents to the ED with multiple comorbidities presents with rapid A. fib, nausea and not tolerating her home medications. She is afebrile. Does have epigastric tenderness therefore CT imaging ordered to evaluate for acute epigastric pathology.  We'll give IV diltiazem bolus. The patient will be placed on continuous pulse oximetry and telemetry for monitoring.  Laboratory evaluation will be sent to evaluate for the above complaints.     Clinical Course as of Oct 11 2329  Wed Oct 11, 2016  2251 LActic acidosis and hypokalemia noted.  Will replete with IV K and MAg.  [PR]    Clinical Course User Index [PR] Merlyn Lot, MD   Patient with hypokalemia to 2.3.  Patient receiving IV and oral repletion.  Currently rate controlled off of diltiazem drip.  CT imaging reviewed.  I spoke with Dr. Jannifer Franklin who agrees to admit patient for additonal potassium repletion and hemodynamic monitoring.  Have discussed with the patient and available family all diagnostics and treatments performed thus far and all questions were answered to the best of my ability. The patient demonstrates understanding and agreement with plan.   ____________________________________________   FINAL CLINICAL IMPRESSION(S) / ED DIAGNOSES  Final diagnoses:  Hypokalemia  Nausea and vomiting, intractability of vomiting not specified, unspecified vomiting type  Lactic acidosis      NEW MEDICATIONS STARTED DURING THIS VISIT:  New Prescriptions   No medications on file     Note:  This document was prepared using Dragon voice recognition software and may include unintentional dictation errors.    Merlyn Lot, MD 10/11/16 [phone]

## 2016-10-12 ENCOUNTER — Encounter: Payer: Self-pay | Admitting: Emergency Medicine

## 2016-10-12 ENCOUNTER — Observation Stay: Payer: Medicare Other | Admitting: Certified Registered Nurse Anesthetist

## 2016-10-12 ENCOUNTER — Encounter
Admission: EM | Disposition: A | Discharge status: Skilled Nursing Facility | Payer: Self-pay | Source: Home / Self Care | Attending: Student in an Organized Health Care Education/Training Program

## 2016-10-12 DIAGNOSIS — R112 Nausea with vomiting, unspecified: Secondary | ICD-10-CM

## 2016-10-12 HISTORY — PX: ESOPHAGOGASTRODUODENOSCOPY (EGD) WITH PROPOFOL: SHX5813

## 2016-10-12 LAB — URINALYSIS COMPLETE WITH MICROSCOPIC (ARMC ONLY)
BACTERIA UA: NONE SEEN
BILIRUBIN URINE: NEGATIVE
Glucose, UA: NEGATIVE mg/dL
HGB URINE DIPSTICK: NEGATIVE
KETONES UR: NEGATIVE mg/dL
NITRITE: NEGATIVE
PH: 8 (ref 5.0–8.0)
PROTEIN: NEGATIVE mg/dL
Specific Gravity, Urine: 1.01 (ref 1.005–1.030)

## 2016-10-12 LAB — CBC
HEMATOCRIT: 34 % — AB (ref 35.0–47.0)
HEMOGLOBIN: 10.9 g/dL — AB (ref 12.0–16.0)
MCH: 26.6 pg (ref 26.0–34.0)
MCHC: 32 g/dL (ref 32.0–36.0)
MCV: 82.9 fL (ref 80.0–100.0)
Platelets: 274 10*3/uL (ref 150–440)
RBC: 4.11 MIL/uL (ref 3.80–5.20)
RDW: 15.9 % — ABNORMAL HIGH (ref 11.5–14.5)
WBC: 5.9 10*3/uL (ref 3.6–11.0)

## 2016-10-12 LAB — BASIC METABOLIC PANEL
ANION GAP: 9 (ref 5–15)
BUN: 6 mg/dL (ref 6–20)
CALCIUM: 8.5 mg/dL — AB (ref 8.9–10.3)
CO2: 31 mmol/L (ref 22–32)
Chloride: 102 mmol/L (ref 101–111)
Creatinine, Ser: 0.61 mg/dL (ref 0.44–1.00)
GFR calc non Af Amer: >60 mL/min (ref >60)
Glucose, Bld: 130 mg/dL — ABNORMAL HIGH (ref 65–99)
POTASSIUM: 2.9 mmol/L — AB (ref 3.5–5.1)
Sodium: 142 mmol/L (ref 135–145)

## 2016-10-12 LAB — MAGNESIUM: Magnesium: 2 mg/dL (ref 1.7–2.4)

## 2016-10-12 LAB — POTASSIUM: POTASSIUM: 3.3 mmol/L — AB (ref 3.5–5.1)

## 2016-10-12 LAB — LACTIC ACID, PLASMA: LACTIC ACID, VENOUS: 2.5 mmol/L — AB (ref 0.5–1.9)

## 2016-10-12 LAB — MRSA PCR SCREENING: MRSA by PCR: NEGATIVE

## 2016-10-12 SURGERY — ESOPHAGOGASTRODUODENOSCOPY (EGD) WITH PROPOFOL
Anesthesia: General

## 2016-10-12 MED ORDER — PROPOFOL 500 MG/50ML IV EMUL
INTRAVENOUS | Status: DC | PRN
  Administered 2016-10-12: 100 ug/kg/min via INTRAVENOUS

## 2016-10-12 MED ORDER — OXYCODONE HCL 5 MG PO TABS
5.0000 mg | ORAL_TABLET | ORAL | Status: DC | PRN
  Administered 2016-10-12 – 2016-10-19 (×10): 5 mg via ORAL
  Filled 2016-10-12 (×10): qty 1

## 2016-10-12 MED ORDER — SODIUM CHLORIDE 0.9 % IV SOLN
INTRAVENOUS | Status: DC
  Administered 2016-10-12: 17:00:00 via INTRAVENOUS

## 2016-10-12 MED ORDER — SODIUM CHLORIDE 0.9 % IV SOLN
INTRAVENOUS | Status: DC | PRN
  Administered 2016-10-12: 16:00:00 via INTRAVENOUS

## 2016-10-12 MED ORDER — INFLUENZA VAC SPLIT QUAD 0.5 ML IM SUSY
0.5000 mL | PREFILLED_SYRINGE | INTRAMUSCULAR | Status: AC
  Administered 2016-10-13: 0.5 mL via INTRAMUSCULAR
  Filled 2016-10-12: qty 0.5

## 2016-10-12 MED ORDER — POTASSIUM CHLORIDE CRYS ER 20 MEQ PO TBCR
40.0000 meq | EXTENDED_RELEASE_TABLET | ORAL | Status: AC
  Administered 2016-10-12 (×2): 40 meq via ORAL
  Filled 2016-10-12 (×2): qty 2

## 2016-10-12 MED ORDER — FUROSEMIDE 20 MG PO TABS
20.0000 mg | ORAL_TABLET | ORAL | Status: DC
  Administered 2016-10-12 – 2016-10-20 (×5): 20 mg via ORAL
  Filled 2016-10-12 (×6): qty 1

## 2016-10-12 MED ORDER — DILTIAZEM HCL ER COATED BEADS 180 MG PO CP24
180.0000 mg | ORAL_CAPSULE | Freq: Every day | ORAL | Status: DC
  Administered 2016-10-12 – 2016-10-21 (×10): 180 mg via ORAL
  Filled 2016-10-12 (×11): qty 1

## 2016-10-12 MED ORDER — ASPIRIN EC 81 MG PO TBEC
81.0000 mg | DELAYED_RELEASE_TABLET | Freq: Every day | ORAL | Status: DC
  Administered 2016-10-12 – 2016-10-21 (×10): 81 mg via ORAL
  Filled 2016-10-12 (×11): qty 1

## 2016-10-12 MED ORDER — ATORVASTATIN CALCIUM 20 MG PO TABS
80.0000 mg | ORAL_TABLET | Freq: Every day | ORAL | Status: DC
  Administered 2016-10-12 – 2016-10-21 (×10): 80 mg via ORAL
  Filled 2016-10-12 (×11): qty 4

## 2016-10-12 MED ORDER — METOCLOPRAMIDE HCL 5 MG PO TABS: 5.0000 mg | ORAL_TABLET | Freq: Four times a day (QID) | ORAL | Status: DC | PRN

## 2016-10-12 MED ORDER — ONDANSETRON HCL 4 MG/2ML IJ SOLN
4.0000 mg | Freq: Four times a day (QID) | INTRAMUSCULAR | Status: AC
  Administered 2016-10-12 – 2016-10-14 (×8): 4 mg via INTRAVENOUS
  Filled 2016-10-12 (×8): qty 2

## 2016-10-12 MED ORDER — SODIUM CHLORIDE 0.9% FLUSH
3.0000 mL | Freq: Two times a day (BID) | INTRAVENOUS | Status: DC
  Administered 2016-10-12 – 2016-10-21 (×17): 3 mL via INTRAVENOUS

## 2016-10-12 MED ORDER — LIDOCAINE HCL (CARDIAC) 20 MG/ML IV SOLN
INTRAVENOUS | Status: DC | PRN
  Administered 2016-10-12: 20 mg via INTRAVENOUS

## 2016-10-12 MED ORDER — PROPOFOL 10 MG/ML IV BOLUS
INTRAVENOUS | Status: DC | PRN
  Administered 2016-10-12: 20 mg via INTRAVENOUS

## 2016-10-12 MED ORDER — SODIUM CHLORIDE 0.9 % IV SOLN
INTRAVENOUS | Status: DC
  Administered 2016-10-12: 02:00:00 via INTRAVENOUS

## 2016-10-12 MED ORDER — ACETAMINOPHEN 650 MG RE SUPP: 650.0000 mg | Freq: Four times a day (QID) | RECTAL | Status: DC | PRN

## 2016-10-12 MED ORDER — TRAMADOL HCL 50 MG PO TABS
50.0000 mg | ORAL_TABLET | Freq: Four times a day (QID) | ORAL | Status: DC | PRN
  Administered 2016-10-12 – 2016-10-15 (×4): 50 mg via ORAL
  Filled 2016-10-12 (×4): qty 1

## 2016-10-12 MED ORDER — FAMOTIDINE IN NACL 20-0.9 MG/50ML-% IV SOLN
20.0000 mg | Freq: Two times a day (BID) | INTRAVENOUS | Status: DC
  Administered 2016-10-12 – 2016-10-18 (×14): 20 mg via INTRAVENOUS
  Filled 2016-10-12 (×17): qty 50

## 2016-10-12 MED ORDER — DILTIAZEM HCL 60 MG PO TABS
60.0000 mg | ORAL_TABLET | Freq: Three times a day (TID) | ORAL | Status: DC
  Administered 2016-10-12: 60 mg via ORAL
  Filled 2016-10-12: qty 1

## 2016-10-12 MED ORDER — ACETAMINOPHEN 325 MG PO TABS
650.0000 mg | ORAL_TABLET | Freq: Four times a day (QID) | ORAL | Status: DC | PRN
  Administered 2016-10-12 – 2016-10-21 (×10): 650 mg via ORAL
  Filled 2016-10-12 (×10): qty 2

## 2016-10-12 MED ORDER — ENOXAPARIN SODIUM 40 MG/0.4ML ~~LOC~~ SOLN
40.0000 mg | Freq: Two times a day (BID) | SUBCUTANEOUS | Status: DC
  Administered 2016-10-12: 40 mg via SUBCUTANEOUS
  Filled 2016-10-12: qty 0.4

## 2016-10-12 MED ORDER — ONDANSETRON HCL 4 MG/2ML IJ SOLN: 4.0000 mg | Freq: Four times a day (QID) | INTRAMUSCULAR | Status: DC | PRN

## 2016-10-12 MED ORDER — ONDANSETRON HCL 4 MG PO TABS: 4.0000 mg | ORAL_TABLET | Freq: Four times a day (QID) | ORAL | Status: DC | PRN

## 2016-10-12 NOTE — Progress Notes (Signed)
Pharmacist - Prescriber Communication  Lovenox 40 mg subcutaneously Q24H has been changed to Q12H for BMI > 40.  Michele Schultz A. [place], [place].D., BCPS Clinical Pharmacist 10/12/2016 0139

## 2016-10-12 NOTE — Anesthesia Procedure Notes (Signed)
Date/Time: 10/12/2016 4:38 PM Performed by: Johnna Acosta Pre-anesthesia Checklist: Patient identified, Emergency Drugs available, Suction available, Patient being monitored and Timeout performed Patient Re-evaluated:Patient Re-evaluated prior to inductionOxygen Delivery Method: Nasal cannula

## 2016-10-12 NOTE — Op Note (Signed)
[facility] Gastroenterology Patient Name: Michele Schultz Procedure Date: 10/12/2016 4:36 PM MRN: [id number] Account #: [id number] Date of Birth: November 06, 1931 Admit Type: Inpatient Age: 82 Room: [facility] ENDO ROOM 4 Gender: Female Note Status: Finalized Procedure:            Upper GI endoscopy Indications:          Nausea with vomiting Providers:            Lucilla Lame MD, MD Referring MD:         Birdie Sons (Referring MD) Medicines:            Propofol per Anesthesia Complications:        No immediate complications. Procedure:            Pre-Anesthesia Assessment:                       - Prior to the procedure, a History and Physical was                        performed, and patient medications and allergies were                        reviewed. The patient's tolerance of previous                        anesthesia was also reviewed. The risks and benefits of                        the procedure and the sedation options and risks were                        discussed with the patient. All questions were                        answered, and informed consent was obtained. Prior                        Anticoagulants: The patient has taken no previous                        anticoagulant or antiplatelet agents. ASA Grade                        Assessment: II - A patient with mild systemic disease.                        After reviewing the risks and benefits, the patient was                        deemed in satisfactory condition to undergo the                        procedure.                       After obtaining informed consent, the endoscope was                        passed under direct vision. Throughout the procedure,  the patient's blood pressure, pulse, and oxygen                        saturations were monitored continuously. The Endoscope                        was introduced through the mouth, and advanced to the           second part of duodenum. The upper GI endoscopy was                        accomplished without difficulty. The patient tolerated                        the procedure well. The patient tolerated the procedure                        well. Findings:      A small hiatal hernia was present.      The stomach was normal.      There was a medium-sized lipoma in the duodenal bulb. Impression:           - Small hiatal hernia.                       - Normal stomach.                       - Duodenal lipoma.                       - No specimens collected. Recommendation:       - Return patient to hospital ward for observation. Procedure Code(s):    --- Professional ---                       [phone], Esophagogastroduodenoscopy, flexible, transoral;                        diagnostic, including collection of specimen(s) by                        brushing or washing, when performed (separate procedure) Diagnosis Code(s):    --- Professional ---                       R11.2, Nausea with vomiting, unspecified CPT copyright 2016 American Medical Association. All rights reserved. The codes documented in this report are preliminary and upon coder review may  be revised to meet current compliance requirements. Lucilla Lame MD, MD 10/12/2016 4:57:51 PM This report has been signed electronically. Number of Addenda: 0 Note Initiated On: 10/12/2016 4:36 PM      [facility]

## 2016-10-12 NOTE — Care Management (Signed)
Patient presents from [facility] for weakness and nausea.  Found to be in atrial fib rvr.  Required IV cardiazem in the ED.  She is followed by [facility] care SN and PT.  Informed agency that patient is currently observation.

## 2016-10-12 NOTE — Transfer of Care (Signed)
Immediate Anesthesia Transfer of Care Note  Patient: Michele Schultz  Procedure(s) Performed: Procedure(s): ESOPHAGOGASTRODUODENOSCOPY (EGD) WITH PROPOFOL (N/A)  Patient Location: PACU  Anesthesia Type:General  Level of Consciousness: sedated  Airway & Oxygen Therapy: Patient Spontanous Breathing and Patient connected to nasal cannula oxygen  Post-op Assessment: Report given to RN and Post -op Vital signs reviewed and stable  Post vital signs: Reviewed and stable  Last Vitals:  Vitals:   10/12/16 1624 10/12/16 1656  BP: (!) 106/53 (!) 112/51  Pulse: 91 88  Resp: 18 (!) 29  Temp: 37.3 C 37.3 C    Last Pain:  Vitals:   10/12/16 1656  TempSrc: Tympanic  PainSc:       Patients Stated Pain Goal: 3 ([id number])  Complications: No apparent anesthesia complications

## 2016-10-12 NOTE — Consult Note (Signed)
Lucilla Lame, MD [address] [place], [place] [address] Phone: [phone] Fax : [phone]  Consultation  Referring Provider:     No ref. provider found Primary Care Physician:  [name], MD Primary Gastroenterologist:        Reason for Consultation:     Nausea  Date of Admission:  10/11/2016 Date of Consultation:  10/12/2016         HPI:   Michele Schultz is a 82 y.o. female who comes in with a history of nausea and weakness. The patient was in nature fibrillation with rapid ventricular response. The patient has been in the hospital back in the middle of October for the same symptoms. Patient was sent home with the idea that she would have a outpatient procedure done. The patient had been on Zofran for nausea as an outpatient but had run out of the medication prior to her last ED visit on the fourth of this month. The patient was also having some epigastric pain with her nausea that she reports to have been present for due 2 days. There is no report of any chest pain or diarrhea.  Past Medical History:  Diagnosis Date  . Anemia   . Aortic stenosis, severe    a. s/p Magna Ease pericardial tissue valve size 21 mm replacement in 10/2011 for severe AS 11/12; b. echo 07/2015: EF 55-60% mod concentric LVH, GR1DD, LA mildly dilated, PASP 45 mm Hg  . Atrial tachycardia, paroxysmal (HCC)    a. with rate related LBBB.  . Cardiac pacemaker -st Judes    11/12  . Chronic diastolic heart failure ([place])    a. echo 2014: EF 55-60%, no RWMA, GR1DD, PASP 47 mm Hg; b. echo 07/2015: EF 55-60% mod concentric LVH, GR1DD, LA mildly dilated, PASP 45 mm Hg  . Complete heart block [facility])    a. s/p St Jude PPM 10/2011 (Ser # [id number]).  . Coronary artery disease    a. s/p 2v CABG 11/12 (VG-LAD, VG-OM1); b. Lexiscan 08/2015: low risk, no ischemia, EF 55-65%.  . Enthesopathy of hip region   . Esophageal reflux    followed by Dr.Seigal. stabilized with a combination of Nexium and  Zantac  . Fibromyalgia   . HLD (hyperlipidemia)   . Hypertensive heart disease   . Knee joint replacement by other means   . Morbid obesity ([place])   . Neuralgia, neuritis, and radiculitis, unspecified   . Neuropathy ([place])   . Onychia and paronychia of toe   . Osteoarthrosis, unspecified whether generalized or localized, pelvic region and thigh    mainly in her back and knees  . PAF (paroxysmal atrial fibrillation) ([place])    a. brief episodes of AF previously noted on device interrogations.  [place] RAD (reactive airway disease)    a. chronic SOB  . Spinal stenosis   . Stress incontinence, female    followed by Dr.Cope  . Type II or unspecified type diabetes mellitus without mention of complication, not stated as uncontrolled    a. pt. reports that she is borderline   . Wide-complex tachycardia ([place])    a. Noted 4/10 - brief episode in ED. Noted again 4/21 in clinic->felt most likely to be atrial tach.    Past Surgical History:  Procedure Laterality Date  . AORTIC VALVE REPLACEMENT  10/17/2011   Procedure: AORTIC VALVE REPLACEMENT (AVR);  Surgeon: Melrose Nakayama, MD;  Location: [facility];  Service: Open Heart Surgery;  Laterality: N/A;  .  CARDIAC CATHETERIZATION  08/2009   50% stenosis distal left main, 50% stenosis ostial left circumflex.   [place] CARDIAC CATHETERIZATION     at [facility]  . CORONARY ARTERY BYPASS GRAFT  10/17/2011   Procedure: CORONARY ARTERY BYPASS GRAFTING (CABG);  Surgeon: Melrose Nakayama, MD;  Location: [facility];  Service: Open Heart Surgery;  Laterality: N/A;  Times Two, using right leg greater saphenous vein harvested endoscopically  . EYE SURGERY     IOL/ after cataracts removed- bilateral   . NASAL SINUS SURGERY  2009  . PERMANENT PACEMAKER INSERTION N/A 10/20/2011   Procedure: PERMANENT PACEMAKER INSERTION;  Surgeon: Thompson Grayer, MD;  Location: [facility] CATH LAB;  Service: Cardiovascular;  Laterality: N/A;  . REPLACEMENT TOTAL KNEE  11/2006   right knee  . TOTAL HIP  ARTHROPLASTY  09/2010    Prior to Admission medications   Medication Sig Start Date End Date Taking? Authorizing Provider  acetaminophen (TYLENOL) 325 MG tablet Take 650 mg by mouth 3 (three) times daily as needed.   Yes Historical Provider, MD  acidophilus (RISAQUAD) CAPS capsule Take 1 capsule by mouth 2 (two) times daily. 06/26/16  Yes Bettey Costa, MD  aspirin EC 81 MG tablet Take 81 mg by mouth daily.   Yes Historical Provider, MD  atorvastatin (LIPITOR) 80 MG tablet Take 80 mg by mouth daily.   Yes Historical Provider, MD  Calcium Carbonate-Vitamin D 600-400 MG-UNIT tablet Take 1 tablet by mouth daily.   Yes Historical Provider, MD  Cholecalciferol 1000 units capsule Take 1,000 Units by mouth daily.   Yes Historical Provider, MD  furosemide (LASIX) 20 MG tablet Take 1 tablet (20 mg total) by mouth every other day. 04/06/16  Yes Rogelia Mire, NP  HYDROcodone-acetaminophen (NORCO) 10-325 MG tablet Take 1 tablet by mouth every 4 (four) hours as needed for moderate pain. Reported on 05/30/2016 06/26/16  Yes Sital Mody, MD  magnesium hydroxide (MILK OF MAGNESIA) 400 MG/5ML suspension Take 10 mLs by mouth daily as needed for mild constipation.    Yes Historical Provider, MD  metoCLOPramide (REGLAN) 5 MG tablet Take 1 tablet (5 mg total) by mouth every 6 (six) hours as needed for nausea or vomiting. 09/21/16  Yes Vaughan Basta, MD  nitroGLYCERIN (NITROSTAT) 0.4 MG SL tablet Place 1 tablet (0.4 mg total) under the tongue every 5 (five) minutes as needed for chest pain. 01/24/13  Yes Minna Merritts, MD  ondansetron (ZOFRAN-ODT) 4 MG disintegrating tablet Take 1 tablet (4 mg total) by mouth every 8 (eight) hours as needed for nausea or vomiting. 10/07/16  Yes Carrie Mew, MD  traMADol (ULTRAM) 50 MG tablet Take 1 tablet (50 mg total) by mouth every 6 (six) hours as needed. 04/23/16 04/23/17 Yes Earleen Newport, MD  diltiazem (CARDIZEM CD) 120 MG 24 hr capsule Take 1 capsule (120 mg  total) by mouth daily. Patient not taking: Reported on 10/11/2016 09/21/16   Vaughan Basta, MD  promethazine (PHENERGAN) 12.5 MG tablet Take 1 tablet (12.5 mg total) by mouth every 6 (six) hours as needed for nausea or vomiting. Patient not taking: Reported on 10/11/2016 09/22/16   Vaughan Basta, MD    Family History  Problem Relation Age of Onset  . Heart disease Sister   . Heart disease Brother   . Heart disease Brother   . Heart disease Brother   . Diabetes Other   . Anesthesia problems Neg Hx   . Hypotension Neg Hx   . Malignant hyperthermia Neg Hx   .  Pseudochol deficiency Neg Hx      Social History  Substance Use Topics  . Smoking status: Never Smoker  . Smokeless tobacco: Never Used  . Alcohol use No    Allergies as of 10/11/2016 - Review Complete 10/11/2016  Allergen Reaction Noted  . Citalopram Other (See Comments) 03/21/2011  . Cymbalta [duloxetine hcl] Other (See Comments) 03/21/2011  . Imipramine Other (See Comments) 03/21/2011  . Proton pump inhibitors Other (See Comments) 03/21/2011  . Venlafaxine Nausea And Vomiting and Other (See Comments) 03/13/2016    Review of Systems:    All systems reviewed and negative except where noted in HPI.   Physical Exam:  Vital signs in last 24 hours: Temp:  [98.2 F (36.8 C)-99.2 F (37.3 C)] 99.2 F (37.3 C) (11/09 1656) Pulse Rate:  [82-95] 84 (11/09 1726) Resp:  [14-23] 15 (11/09 1726) BP: (106-157)/(51-92) 131/56 (11/09 1726) SpO2:  [92 %-100 %] 96 % (11/09 1726) Weight:  [240 lb (108.9 kg)-257 lb 1.6 oz (116.6 kg)] 257 lb (116.6 kg) (11/09 1624)   General:   Pleasant, cooperative in NAD, Obese Head:  Normocephalic and atraumatic. Eyes:   No icterus.   Conjunctiva pink. PERRLA. Ears:  Normal auditory acuity. Neck:  Supple; no masses or thyroidomegaly Lungs: Respirations even and unlabored. Lungs clear to auscultation bilaterally.   No wheezes, crackles, or rhonchi.  Heart:  Regular rate and  rhythm;  Without murmur, clicks, rubs or gallops Abdomen:  Soft, nondistended, nontender. Normal bowel sounds. No appreciable masses or hepatomegaly.  No rebound or guarding.  Rectal:  Not performed. Msk:  Symmetrical without gross deformities.    Extremities:  Without edema, cyanosis or clubbing. Neurologic:  Alert and oriented x3;  grossly normal neurologically. Skin:  Intact without significant lesions or rashes. Cervical Nodes:  No significant cervical adenopathy. Psych:  Alert and cooperative. Normal affect.  LAB RESULTS:  Recent Labs  10/11/16 2145 10/12/16 0632  WBC 7.1 5.9  HGB 12.1 10.9*  HCT 38.1 34.0*  PLT 279 274   BMET  Recent Labs  10/11/16 2145 10/12/16 0632 10/12/16 1048  NA 139 142  --   K 2.3* 2.9* 3.3*  CL 96* 102  --   CO2 33* 31  --   GLUCOSE 155* 130*  --   BUN 10 6  --   CREATININE 0.71 0.61  --   CALCIUM 9.3 8.5*  --    LFT  Recent Labs  10/11/16 2145  PROT 7.9  ALBUMIN 3.7  AST 46*  ALT 21  ALKPHOS 79  BILITOT 0.6   PT/INR No results for input(s): LABPROT, INR in the last 72 hours.  STUDIES: Dg Chest 2 View  Result Date: 10/11/2016 CLINICAL DATA:  Nausea and weakness EXAM: CHEST  2 VIEW COMPARISON:  10/07/2016 FINDINGS: Left-sided dual lead pacing device with leads over the right atrium and right ventricle. Post sternotomy changes. Possible tiny right pleural effusion. Stable enlarged cardiomediastinal none with tortuous aorta. Hazy bibasilar opacity likely attributable to overlying soft tissues. No focal consolidation. No pneumothorax. IMPRESSION: 1. Stable degree of cardiomegaly.  No overt failure. 2. Possible tiny right pleural effusion 3. No focal consolidation Electronically Signed   By: Donavan Foil M.D.   On: 10/11/2016 21:43   Ct Abdomen Pelvis W Contrast  Result Date: 10/11/2016 CLINICAL DATA:  Epigastric pain nausea and weakness EXAM: CT ABDOMEN AND PELVIS WITH CONTRAST TECHNIQUE: Multidetector CT imaging of the abdomen  and pelvis was performed using the standard protocol following bolus  administration of intravenous contrast. CONTRAST:  167mL ISOVUE-300 IOPAMIDOL (ISOVUE-300) INJECTION 61% COMPARISON:  09/17/2016 FINDINGS: Lower chest: Hazy density within the lingula could reflect atelectasis or a mild infiltrate. No effusion. Mild subpleural opacities at the right middle lobe are unchanged, possible mild fibrosis. Heart is enlarged. Partially visualized pacer leads. Calcified mitral annulus. Hepatobiliary: Diffuse decreased density of the liver, consistent with fatty infiltration. Liver is slightly enlarged at 17 cm craniocaudad. No focal hepatic abnormalities. Small amount of high density material is present within the gallbladder, which may reflect small stones or sludge. No biliary dilatation. Pancreas: Unremarkable. No pancreatic ductal dilatation or surrounding inflammatory changes.Choose Spleen: Normal in size without focal abnormality.Small accessory splenule. Adrenals/Urinary Tract: Adrenal glands within normal limits. Punctate nonobstructing stones within the kidneys. Cortical hypodense lesion within the left kidney too small to further characterize, grossly unchanged. Small amount of air within the bladder. Stomach/Bowel: The stomach is nondilated and contains possible small pill fragment. Duodenum lipoma unchanged. No dilated small bowel to suggest an obstruction. Mild colon diverticular disease without bowel inflammation. Transverse lie of the cecum. Appendix is normal. Vascular/Lymphatic: Atherosclerotic vascular calcifications but no aneurysm. No significantly enlarged lymph nodes. Reproductive: Uterus and bilateral adnexa are unremarkable. Other: No free air or free fluid Musculoskeletal: Patient is status post right hip replacement. Multilevel degenerative changes of the spine with mild anterior listhesis of L4 on L5 and mild retrolisthesis of L5 on S1. Vacuum discs at L4-L5 and L5-S1. No acute osseous  abnormality. IMPRESSION: 1. Hazy density in the lingula could reflect atelectasis or mild infiltrate. 2. Slightly enlarged fatty liver. 3. Small amount of stones or high density sludge within the gallbladder. No wall thickening. No biliary dilatation. 4. Punctate nonobstructing stones within both kidneys. 5. No evidence for small bowel obstruction. 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 Normal appendix. Electronically Signed   By: Donavan Foil M.D.   On: 10/11/2016 23:22      Impression / Plan:   Michele Schultz is a 82 y.o. y/o female with Recurrent nausea. The patient also has some epigastric discomfort. She had a CT scan which showed some small amount of stones or high density sludge within the gallbladder but no wall thickening and some nonobstructing stones in the kidneys. The patient's nausea and vomiting may be from multiple sources. I will perform an EGD on her today to make sure that it is not from a GI source such as reflux or peptic ulcer disease as the cause of her nausea. The patient has been explained the plan and agrees with it.   Thank you for involving me in the care of this patient.      LOS: 0 days   Lucilla Lame, MD  10/12/2016, 5:28 PM   Note: This dictation was prepared with Dragon dictation along with smaller phrase technology. Any transcriptional errors that result from this process are unintentional.

## 2016-10-12 NOTE — Anesthesia Preprocedure Evaluation (Signed)
Anesthesia Evaluation  Patient identified by MRN, date of birth, ID band Patient awake    Reviewed: Allergy & Precautions, H&P , NPO status , Patient's Chart, lab work & pertinent test results, reviewed documented beta blocker date and time   Airway Mallampati: II   Neck ROM: Full    Dental  (+) Teeth Intact, Dental Advisory Given   Pulmonary shortness of breath and with exertion,    Pulmonary exam normal        Cardiovascular hypertension, Pt. on home beta blockers + angina with exertion + CAD  + dysrhythmias Supra Ventricular Tachycardia  + Systolic Click Cardiac cath+ Left main stenosis= 50% Left circ stenosis = 50% EF= 55 to 60% Aortic Valve area = 0.9 cm. With mean gradient of 45mmHg.   Neuro/Psych  Headaches, negative psych ROS   GI/Hepatic Neg liver ROS, GERD  Medicated and Controlled,  Endo/Other  diabetes, Type 2  Renal/GU negative Renal ROS  negative genitourinary   Musculoskeletal  (+) Arthritis , Osteoarthritis,  Fibromyalgia -  Abdominal Normal abdominal exam  (+)   Peds negative pediatric ROS (+)  Hematology  (+) anemia ,   Anesthesia Other Findings   Reproductive/Obstetrics                             Anesthesia Physical  Anesthesia Plan  ASA: III  Anesthesia Plan: General   Post-op Pain Management:    Induction: Intravenous  Airway Management Planned: Nasal Cannula  Additional Equipment:   Intra-op Plan:   Post-operative Plan:   Informed Consent: I have reviewed the patients History and Physical, chart, labs and discussed the procedure including the risks, benefits and alternatives for the proposed anesthesia with the patient or authorized representative who has indicated his/her understanding and acceptance.   Dental advisory given  Plan Discussed with: Anesthesiologist, CRNA and Surgeon  Anesthesia Plan Comments:         Anesthesia Quick  Evaluation

## 2016-10-12 NOTE — Progress Notes (Signed)
Arrival Method: via stretcher with 3 ED NT Mental Orientation:?A&O Telemetry:?[id number], verified by Harriette Bouillon Skin:?Intact, Verified by Lattie Haw, RN IV:?22 G L hand, 22 G L&R AC Pain: Generalized pain, no meds due at this time will notify MD Tubes:?IV potassium with NS carrier Safety Measures: Safety Fall Prevention Plan has been given, discussed &?signed, non skid socks in place, patient has been orientated to the room, unit & staff.  Family: None at bedside upon admission ? Orders have been reviewed & implemented. Will continue to monitor the patient. Call light has been placed within reach

## 2016-10-12 NOTE — Care Management Obs Status (Signed)
[place] NOTIFICATION   Patient Details  Name: Michele Schultz MRN: [id number] Date of Birth: 08-13-1931   Medicare Observation Status Notification Given:  Yes    Beau Fanny, RN 10/12/2016, 8:34 AM

## 2016-10-12 NOTE — ED Notes (Signed)
I WENT INTO THE ROOM TO ASSIST THE NURSE (BUTCH) WITH THE PT. SHE SAID SHE HAD THE USE THE BATHROOM. SHE WAS NOT ABLE TO STAND SO I HELPED BUTCH PULL HER BACK UP IN THE BED. ALL WE HAD IN THE ROOM WERE FRACTURE PANS SO I WENT TO THE CLEAN UTILITY ROOM AND GOT A REGULAR BEDPAN. I ASSISTED THE NURSE PUTTING THE PT ON THE BEDPAN. AS SOON AS MS Current WAS ON THE BEDPAN SHE STARTED SCREAMING " TAKE ME OFF. IT'S HURTING MY BACK. IT'S CUTTING ME IN MY BACK" BUTCH AND I IMMEDIATELY TOOK HER OFF THE BEDPAN AND PULLED HER UP IN THE BED AGAIN. AT THIS POINT, I REMOVED MY GLOVES, SANITIZED MY HANDS AND LEFT THE ROOM.

## 2016-10-12 NOTE — Evaluation (Signed)
Physical Therapy Evaluation Patient Details Name: Michele Schultz MRN: [id number] DOB: [birth year] Today's Date: 10/12/2016   History of Present Illness  Michele Schultz  is a 82 y.o. female who presents with Nausea and weakness, on arrival to the ED she was in A. fib with RVR. She responded well to medications in the ED to control her heart rate, but had persistent nausea weakness, and clinical dehydration. Pt admitted under observation status for recurrent nausea with epigastric pain, afib with RVR, and hypokalemia. Pt denies falls in the last 12 months  Clinical Impression  Pt admitted with above diagnosis. Pt currently with functional limitations due to the deficits listed below (see PT Problem List). Pt is very limited in her mobility and motivation. She moves very slowly and is easily frustrated. Pt agrees to transfer to [facility]. She requires modA+2 for bed mobility and minA+2 for transfers and very short ambulation from bed to [facility]. Pt is relatively unsafe with transfer to [facility]. Unable to ambulate farther at this time. Pt describes fairly limited mobility at baseline. She is intermittently confused and agitated during session. Pt would benefit from [facility] PT after returning to ALF in order to maintain as much independence as possible. Pt will benefit from skilled PT services to address deficits in strength, balance, and mobility in order to return to baseline function at ALF.    Follow Up Recommendations Other (comment);Home health PT (Return to [facility] ALF)    Equipment Recommendations  None recommended by PT    Recommendations for Other Services       Precautions / Restrictions Precautions Precautions: Fall Restrictions Weight Bearing Restrictions: No      Mobility  Bed Mobility Overal bed mobility: Needs Assistance Bed Mobility: Supine to Sit;Sit to Supine     Supine to sit: Mod assist Sit to supine: Mod assist   General bed mobility comments: Pt requiring bed rails and HOB  elevated as well as assist from therapist and RN to come up to sitting at EOB. Requires modA+2 to go from sitting back to supine. HOB in trendelenberg position and bed rails utilized to scoot up toward [facility]  Transfers Overall transfer level: Needs assistance Equipment used: Rolling walker (2 wheeled) Transfers: Sit to/from Stand Sit to Stand: Min assist;+2 safety/equipment         General transfer comment: Pt provided cues for safe hand placement. Transfers require effort and extended time but appears generally safe in standign with support on walker  Ambulation/Gait Ambulation/Gait assistance: Min assist;+2 physical assistance Ambulation Distance (Feet): 3 Feet Assistive device: Rolling walker (2 wheeled) Gait Pattern/deviations: Step-to pattern Gait velocity: Decreased Gait velocity interpretation: <1.8 ft/sec, indicative of risk for recurrent falls General Gait Details: Pt takes short shuffling steps from bed to [facility] and back. She is generally unstable and reaches out for the [facility] prior to turning completely around  Stairs            Wheelchair Mobility    Modified Rankin (Stroke Patients Only)       Balance Overall balance assessment: Needs assistance Sitting-balance support: Feet supported Sitting balance-Leahy Scale: Good Sitting balance - Comments: able to perform multiple exercises at EOB without physical assit.    Standing balance support: Bilateral upper extremity supported Standing balance-Leahy Scale: Fair                               Pertinent Vitals/Pain Pain Assessment: 0-10 Pain Score: 9  Pain Location: "I'm hurting all over." Pt reports she has a history of RA and fibromyalgia. Denies chest pain currently Pain Descriptors / Indicators: Other (Comment) ("Starving" pain) Pain Intervention(s): Monitored during session    Home Living Family/patient expects to be discharged to:: Assisted living Living Arrangements: Other (Comment) (ALF)              Home Equipment: Walker - 4 wheels;Wheelchair - manual Additional Comments: Furniture conservator/restorer ALF    Prior Function Level of Independence: Needs assistance   Gait / Transfers Assistance Needed: Patient reports utilizing rollator and wheelchair at ALF. Reports she hasn't walked in a while  ADL's / Homemaking Assistance Needed: Assist required at ALF  Comments: Pt reports mostly performing transfers. Hasn't ambulated in the last few weeks     Hand Dominance   Dominant Hand: Right    Extremity/Trunk Assessment   Upper Extremity Assessment: Generalized weakness           Lower Extremity Assessment: Generalized weakness         Communication   Communication: No difficulties  Cognition Arousal/Alertness: Awake/alert Behavior During Therapy: WFL for tasks assessed/performed Overall Cognitive Status: Impaired/Different from baseline Area of Impairment: Orientation Orientation Level: Disoriented to;Time;Situation (Constantly reports she needs to toilet but doesn't go)   Memory: Decreased short-term memory              General Comments      Exercises Other Exercises Other Exercises: supine bed ex's; BLE x10 quad sets, heels slides. Sitting EOB; x10B UE shoulder horizontal ab/add, LAQ, marching, glut sets. Standing marching x10B at EOB     Assessment/Plan    PT Assessment Patient needs continued PT services  PT Problem List Decreased strength;Decreased activity tolerance;Decreased balance;Decreased mobility;Decreased knowledge of use of DME;Decreased safety awareness;Obesity          PT Treatment Interventions Gait training;DME instruction;Therapeutic exercise;Therapeutic activities;Functional mobility training;Balance training;Neuromuscular re-education;Patient/family education;Cognitive remediation    PT Goals (Current goals can be found in the Care Plan section)  Acute Rehab PT Goals Patient Stated Goal: Return to prior function PT Goal  Formulation: With patient Time For Goal Achievement: 10/26/16 Potential to Achieve Goals: Fair    Frequency Min 2X/week   Barriers to discharge Inaccessible home environment;Decreased caregiver support      Co-evaluation               End of Session Equipment Utilized During Treatment: Gait belt Activity Tolerance: Patient tolerated treatment well Patient left: with call bell/phone within reach;in bed;with bed alarm set Nurse Communication: Mobility status;Other (comment) (RN assists with transfers)    Functional Assessment Tool Used: clinical judgement Functional Limitation: Mobility: Walking and moving around Mobility: Walking and Moving Around Current Status [phone]): At least 40 percent but less than 60 percent impaired, limited or restricted Mobility: Walking and Moving Around Goal Status [phone]): At least 20 percent but less than 40 percent impaired, limited or restricted    Time: 1055-1120 PT Time Calculation (min) (ACUTE ONLY): 25 min   Charges:   PT Evaluation $PT Eval Moderate Complexity: 1 Procedure PT Treatments $Therapeutic Activity: 8-22 mins   PT G Codes:   PT G-Codes **NOT FOR INPATIENT CLASS** Functional Assessment Tool Used: clinical judgement Functional Limitation: Mobility: Walking and moving around Mobility: Walking and Moving Around Current Status [id number]): At least 40 percent but less than 60 percent impaired, limited or restricted Mobility: Walking and Moving Around Goal Status [phone]): At least 20 percent but less than 40 percent impaired, limited  or restricted   Phillips Grout PT, DPT   Jackie Littlejohn 10/12/2016, 11:41 AM

## 2016-10-12 NOTE — Progress Notes (Addendum)
[place] [facility] [place] NAME: Michele Schultz    MR#:  [id number]  DATE OF BIRTH:  07-13-1931  SUBJECTIVE:  CHIEF COMPLAINT:   Chief Complaint  Patient presents with  . Nausea  . Weakness   - admitted again with Nausea with constipation and intermittent diarrhea. -Kept nothing by mouth, GI consulted for possible EGD later today. -Converted into A. fib with hypokalemia, rate is controlled. Potassium improving  REVIEW OF SYSTEMS:  Review of Systems  Constitutional: Positive for malaise/fatigue. Negative for chills and fever.  HENT: Negative for ear discharge, ear pain, hearing loss and nosebleeds.   Respiratory: Negative for cough, shortness of breath and wheezing.   Cardiovascular: Negative for chest pain, palpitations and leg swelling.  Gastrointestinal: Positive for abdominal pain, nausea and vomiting. Negative for constipation and diarrhea.  Genitourinary: Negative for dysuria.  Musculoskeletal: Positive for myalgias.  Neurological: Negative for dizziness, sensory change, speech change, focal weakness, seizures and headaches.  Psychiatric/Behavioral: Negative for depression.    DRUG ALLERGIES:   Allergies  Allergen Reactions  . Citalopram Other (See Comments)    Reaction:  Altered mental status   . Cymbalta [Duloxetine Hcl] Other (See Comments)    Reaction:  Sedative for pt   . Imipramine Other (See Comments)    Reaction:  Unknown   . Proton Pump Inhibitors Other (See Comments)    Reaction:  Unknown   . Venlafaxine Nausea And Vomiting and Other (See Comments)    Reaction:  Dizziness     VITALS:  Blood pressure (!) 150/59, pulse 92, temperature 98.6 F (37 C), temperature source Oral, resp. rate 18, height 5\' 4"  (1.626 m), weight 116.6 kg (257 lb 1.6 oz), SpO2 97 %.  PHYSICAL EXAMINATION:  Physical Exam  GENERAL:  82 y.o.-year-old obese patient lying in the bed with no acute distress.  EYES: Pupils equal, round, reactive  to light and accommodation. No scleral icterus. Extraocular muscles intact.  HEENT: Head atraumatic, normocephalic. Oropharynx and nasopharynx clear.  NECK:  Supple, no jugular venous distention. No thyroid enlargement, no tenderness.  LUNGS: Normal breath sounds bilaterally, no wheezing, rales,rhonchi or crepitation. No use of accessory muscles of respiration.  CARDIOVASCULAR: S1, S2 normal. No murmurs, rubs, or gallops.  ABDOMEN: Soft, nontender but discomfort in left upper quadrant, obese abdomen, nondistended. Bowel sounds present. No organomegaly or mass.  EXTREMITIES: No pedal edema, cyanosis, or clubbing.  NEUROLOGIC: Cranial nerves II through XII are intact. Muscle strength 5/5 in all extremities. Sensation intact. Gait not checked. Global weakness noted. PSYCHIATRIC: The patient is alert and oriented x 3.  SKIN: No obvious rash, lesion, or ulcer.    LABORATORY PANEL:   CBC  Recent Labs Lab 10/12/16 0632  WBC 5.9  HGB 10.9*  HCT 34.0*  PLT 274   ------------------------------------------------------------------------------------------------------------------  Chemistries   Recent Labs Lab 10/11/16 2145 10/12/16 0632  NA 139 142  K 2.3* 2.9*  CL 96* 102  CO2 33* 31  GLUCOSE 155* 130*  BUN 10 6  CREATININE 0.71 0.61  CALCIUM 9.3 8.5*  AST 46*  --   ALT 21  --   ALKPHOS 79  --   BILITOT 0.6  --    ------------------------------------------------------------------------------------------------------------------  Cardiac Enzymes  Recent Labs Lab 10/11/16 2145  TROPONINI <0.03   ------------------------------------------------------------------------------------------------------------------  RADIOLOGY:  Dg Chest 2 View  Result Date: 10/11/2016 CLINICAL DATA:  Nausea and weakness EXAM: CHEST  2 VIEW COMPARISON:  10/07/2016 FINDINGS: Left-sided dual lead pacing  device with leads over the right atrium and right ventricle. Post sternotomy changes. Possible  tiny right pleural effusion. Stable enlarged cardiomediastinal none with tortuous aorta. Hazy bibasilar opacity likely attributable to overlying soft tissues. No focal consolidation. No pneumothorax. IMPRESSION: 1. Stable degree of cardiomegaly.  No overt failure. 2. Possible tiny right pleural effusion 3. No focal consolidation Electronically Signed   By: Donavan Foil M.D.   On: 10/11/2016 21:43   Ct Abdomen Pelvis W Contrast  Result Date: 10/11/2016 CLINICAL DATA:  Epigastric pain nausea and weakness EXAM: CT ABDOMEN AND PELVIS WITH CONTRAST TECHNIQUE: Multidetector CT imaging of the abdomen and pelvis was performed using the standard protocol following bolus administration of intravenous contrast. CONTRAST:  162mL ISOVUE-300 IOPAMIDOL (ISOVUE-300) INJECTION 61% COMPARISON:  09/17/2016 FINDINGS: Lower chest: Hazy density within the lingula could reflect atelectasis or a mild infiltrate. No effusion. Mild subpleural opacities at the right middle lobe are unchanged, possible mild fibrosis. Heart is enlarged. Partially visualized pacer leads. Calcified mitral annulus. Hepatobiliary: Diffuse decreased density of the liver, consistent with fatty infiltration. Liver is slightly enlarged at 17 cm craniocaudad. No focal hepatic abnormalities. Small amount of high density material is present within the gallbladder, which may reflect small stones or sludge. No biliary dilatation. Pancreas: Unremarkable. No pancreatic ductal dilatation or surrounding inflammatory changes.Choose Spleen: Normal in size without focal abnormality.Small accessory splenule. Adrenals/Urinary Tract: Adrenal glands within normal limits. Punctate nonobstructing stones within the kidneys. Cortical hypodense lesion within the left kidney too small to further characterize, grossly unchanged. Small amount of air within the bladder. Stomach/Bowel: The stomach is nondilated and contains possible small pill fragment. Duodenum lipoma unchanged. No  dilated small bowel to suggest an obstruction. Mild colon diverticular disease without bowel inflammation. Transverse lie of the cecum. Appendix is normal. Vascular/Lymphatic: Atherosclerotic vascular calcifications but no aneurysm. No significantly enlarged lymph nodes. Reproductive: Uterus and bilateral adnexa are unremarkable. Other: No free air or free fluid Musculoskeletal: Patient is status post right hip replacement. Multilevel degenerative changes of the spine with mild anterior listhesis of L4 on L5 and mild retrolisthesis of L5 on S1. Vacuum discs at L4-L5 and L5-S1. No acute osseous abnormality. IMPRESSION: 1. Hazy density in the lingula could reflect atelectasis or mild infiltrate. 2. Slightly enlarged fatty liver. 3. Small amount of stones or high density sludge within the gallbladder. No wall thickening. No biliary dilatation. 4. Punctate nonobstructing stones within both kidneys. 5. No evidence for small bowel obstruction. 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 Normal appendix. Electronically Signed   By: Donavan Foil M.D.   On: 10/11/2016 23:22    EKG:   Orders placed or performed during the hospital encounter of 10/11/16  . EKG 12-Lead  . EKG 12-Lead  . EKG 12-Lead  . EKG 12-Lead    ASSESSMENT AND PLAN:   82 year old female with past medical history significant for severe aortic stenosis, LVH, CAD, chronic diastolic heart failure, history of atrial fibrillation status post pacemaker, GERD, anemia presents to hospital secondary to nausea, vomiting and weakness.  #1 recurrent nausea with epigastric pain- improving with zofran - EGD was planned last adm 3 weeks ago but since improvement- deferred at the time - GI consulted, keep NPO- zofran scheduled again - possible EGD today - patient doesn't remember her allergy to PPI as listed. Started pepcid IV bid for now - likely secondary to poor diet, noncompliance  #2 Afib with rvr- known h/o Afib, s/p pacemaker - rate controlled now, triggered  by hypokalemia likely Due to atrail  tachycardia history- last adm, started on cardizem- continue- increase dose - correct electrolytes - outpatient cardiology f/u  #3 Hypokalemia- check magnesium level,, replace appropriately  #4 Diastolic CHF- stable, well compensated. on low-dose Lasix  #5 hypertension-on Cardizem by mouth.  #6 DVT prophylaxis- discontinue Lovenox in anticipation of EGD later today   Physical therapy consult when stable   All the records are reviewed and case discussed with Care Management/Social Workerr. Management plans discussed with the patient, family and they are in agreement.  CODE STATUS: DNR  TOTAL TIME TAKING CARE OF THIS PATIENT: 37 minutes.   POSSIBLE D/C IN 1-2 DAYS, DEPENDING ON CLINICAL CONDITION.   Gladstone Lighter M.D on 10/12/2016 at 9:49 AM  Between 7am to 6pm - Pager - [phone]  After 6pm go to [email or web address] - password [email or web address] [facility]  Office  [phone]  CC: Primary care physician; [name], MD

## 2016-10-12 NOTE — Progress Notes (Signed)
Initial Nutrition Assessment  DOCUMENTATION CODES:   Morbid obesity  INTERVENTION:  -Monitor for diet advancement/PO Intake vs. Nutrition Support  NUTRITION DIAGNOSIS:   Inadequate oral intake related to inability to eat as evidenced by NPO status, per patient/family report.  GOAL:   Patient will meet greater than or equal to 90% of their needs  MONITOR:   I & O's, Diet advancement, Labs, Weight trends, Supplement acceptance  REASON FOR ASSESSMENT:   Malnutrition Screening Tool    ASSESSMENT:   Michele Schultz  is a 82 y.o. female who presents with Nausea and weakness, on arrival to the ED she was in A. fib with RVR.   Spoke with [name] at bedside - she did not speak to me for long before she told me she did not want to talk, does not feel good. She admits to vomiting with PO intake for 1-2 weeks, supposed to undergo EGD today. Patient was anxious, somewhat agitated about EGD and not knowing what time she is to have it done at. Per chart, she exhibits an 18#/6.5% insignificant wt loss over 3 months. Patient's abdomen does seem distended, taut. Will follow-up after EGD Labs and medications reviewed. Nutrition-Focused physical exam completed. Findings are no fat depletion, no muscle depletion, and no edema.   Diet Order:  Diet NPO time specified Except for: Ice Chips, Sips with Meds  Skin:  Reviewed, no issues  Last BM:  PTA  Height:   Ht Readings from Last 1 Encounters:  10/11/16 5\' 4"  (1.626 m)    Weight:   Wt Readings from Last 1 Encounters:  10/12/16 257 lb 1.6 oz (116.6 kg)    Ideal Body Weight:  54.54 kg  BMI:  Body mass index is 44.13 kg/m.  Estimated Nutritional Needs:   Kcal:  1700-2100 (25-30 cal/kg ABW)  Protein:  116 gm  Fluid:  >/= 1.7L  EDUCATION NEEDS:   No education needs identified at this time  Satira Anis. Patricia Fargo, MS, RD LDN Inpatient Clinical Dietitian Pager [phone]

## 2016-10-12 NOTE — ED Notes (Signed)
Pt transported to 2A by EDT techs Aldona Bar, Willow Ora, and Mayra). Upon return to ED, the techs informed this RN that the pt was wet and said that she was never offered toileting while in the ED and that her nurse said "they will clean you up when you get upstairs", which was totally false. The pt had asked one time in the ED to use the restroom, and once it was observed by this RN that she was unable to sit up, let alone ambulate to the toilet, I called for assistance from [place], EDT to assist in sliding the pt up in the bed and placing her on the bedpan (the pt was informed a urine specimen was needed for UA). The pt's incontinence brief was soaked and removed prior to placing the pt on the bedpan. As soon as she was rolled into position onto the bedpan, she started yelling that "it was cutting into her back" and for it to be "taken out right now". Pt denied feeling the need to urinate anymore and was instructed to call out when feeling the need to void so she could be placed again on the bedpan and a sample collected for the UA. The pt called out many, many times during the rest of her stay in the ED, but only to complain about her IV potassium burning her hand, and NEVER one time about needing the bedpan to urinate.

## 2016-10-13 ENCOUNTER — Encounter: Payer: Self-pay | Admitting: Gastroenterology

## 2016-10-13 DIAGNOSIS — R112 Nausea with vomiting, unspecified: Secondary | ICD-10-CM | POA: Diagnosis not present

## 2016-10-13 LAB — BASIC METABOLIC PANEL
ANION GAP: 9 (ref 5–15)
BUN: 8 mg/dL (ref 6–20)
CHLORIDE: 105 mmol/L (ref 101–111)
CO2: 30 mmol/L (ref 22–32)
Calcium: 8.9 mg/dL (ref 8.9–10.3)
Creatinine, Ser: 0.62 mg/dL (ref 0.44–1.00)
GFR calc Af Amer: >60 mL/min (ref >60)
GLUCOSE: 126 mg/dL — AB (ref 65–99)
POTASSIUM: 3.2 mmol/L — AB (ref 3.5–5.1)
Sodium: 144 mmol/L (ref 135–145)

## 2016-10-13 LAB — MAGNESIUM: Magnesium: 2.1 mg/dL (ref 1.7–2.4)

## 2016-10-13 MED ORDER — MAGNESIUM HYDROXIDE 400 MG/5ML PO SUSP
30.0000 mL | Freq: Three times a day (TID) | ORAL | Status: DC | PRN
  Administered 2016-10-13 – 2016-10-20 (×8): 30 mL via ORAL
  Filled 2016-10-13 (×8): qty 30

## 2016-10-13 MED ORDER — ENOXAPARIN SODIUM 40 MG/0.4ML ~~LOC~~ SOLN
40.0000 mg | SUBCUTANEOUS | Status: DC
  Administered 2016-10-13: 40 mg via SUBCUTANEOUS
  Filled 2016-10-13: qty 0.4

## 2016-10-13 MED ORDER — POTASSIUM CHLORIDE CRYS ER 20 MEQ PO TBCR
20.0000 meq | EXTENDED_RELEASE_TABLET | Freq: Two times a day (BID) | ORAL | Status: AC
  Administered 2016-10-14 (×2): 20 meq via ORAL
  Filled 2016-10-13 (×2): qty 1

## 2016-10-13 NOTE — Clinical Social Work Note (Signed)
Clinical Social Work Assessment  Patient Details  Name: Michele Schultz MRN: [id number] Date of Birth: 07-26-1931  Date of referral:  10/13/16               Reason for consult:  Facility Placement                Permission sought to share information with:  Facility Sport and exercise psychologist Permission granted to share information::  Yes, Verbal Permission Granted  Name::     Delrae Sawyers [phone] or Knight,Shelia Niece [phone]  [phone] or Margo Aye Niece [phone]   Agency::  [facility]  Relationship::     Contact Information:     Housing/Transportation Living arrangements for the past 2 months:  [place] of Information:  Patient Patient Interpreter Needed:  None Criminal Activity/Legal Involvement Pertinent to Current Situation/Hospitalization:  No - Comment as needed Significant Relationships:  Other Family Members Lives with:  Facility Resident Do you feel safe going back to the place where you live?  Yes Need for family participation in patient care:  Yes (Comment) (Patient has some confusion)  Care giving concerns:  Patient does not express any concerns about returning back to [place].   Social Worker assessment / plan:  Patient is an 82 year old female who is alert and oriented x3.  Patient was lethargic, during assessment, but expressed that she is from [place] and plans to return back to ALF.  Patietn was informed of role of MSW and process for having her return back to ALF.  Patient did not express any other concerns or issues.  Employment status:  Retired Forensic scientist:  Medicare PT Recommendations:  Home with [place] / Referral to community resources:     Patient/Family's Response to care:  Patient in agreement to going to back to ALF with home health. Patient/Family's Understanding of and Emotional Response to Diagnosis, Current Treatment, and Prognosis:   Patient did not express any concerns about diagnosis or treatment plan.  Emotional Assessment Appearance:  Appears stated age Attitude/Demeanor/Rapport:    Affect (typically observed):  Appropriate, Calm, Stable Orientation:  Oriented to Self Alcohol / Substance use:  Not Applicable Psych involvement (Current and /or in the community):  No (Comment)  Discharge Needs  Concerns to be addressed:  No discharge needs identified Readmission within the last 30 days:  Yes (09/22/16 [facility]) Current discharge risk:  Chronically ill Barriers to Discharge:  Continued Medical Work up   Ross Ludwig 10/13/2016, 4:09 PM

## 2016-10-13 NOTE — Progress Notes (Signed)
[facility] at [place] NAME: Michele Schultz    MR#:  [id number]  DATE OF BIRTH:  December 27, 1930  SUBJECTIVE:  CHIEF COMPLAINT:   Chief Complaint  Patient presents with  . Nausea  . Weakness   - admitted again with Nausea with constipation and intermittent diarrhea. On and off right upper quadrant and epigastric pain. EGD showed nothing acute other than hiatal hernia.  Significant nausea today. No vomiting.  REVIEW OF SYSTEMS:  Review of Systems  Constitutional: Positive for malaise/fatigue. Negative for chills and fever.  HENT: Negative for ear discharge, ear pain, hearing loss and nosebleeds.   Respiratory: Negative for cough, shortness of breath and wheezing.   Cardiovascular: Negative for chest pain, palpitations and leg swelling.  Gastrointestinal: Positive for abdominal pain, nausea and vomiting. Negative for constipation and diarrhea.  Genitourinary: Negative for dysuria.  Musculoskeletal: Positive for myalgias.  Neurological: Negative for dizziness, sensory change, speech change, focal weakness, seizures and headaches.  Psychiatric/Behavioral: Negative for depression.    DRUG ALLERGIES:   Allergies  Allergen Reactions  . Citalopram Other (See Comments)    Reaction:  Altered mental status   . Cymbalta [Duloxetine Hcl] Other (See Comments)    Reaction:  Sedative for pt   . Imipramine Other (See Comments)    Reaction:  Unknown   . Proton Pump Inhibitors Other (See Comments)    Reaction:  Unknown   . Venlafaxine Nausea And Vomiting and Other (See Comments)    Reaction:  Dizziness     VITALS:  Blood pressure 130/85, pulse 90, temperature 98.8 F (37.1 C), temperature source Oral, resp. rate 18, height 5\' 4"  (1.626 m), weight 116.3 kg (256 lb 4.8 oz), SpO2 98 %.  PHYSICAL EXAMINATION:  Physical Exam  GENERAL:  82 y.o.-year-old obese patient lying in the bed. Distress due to nausea EYES: Pupils equal, round, reactive to  light and accommodation. No scleral icterus. Extraocular muscles intact.  HEENT: Head atraumatic, normocephalic. Oropharynx and nasopharynx clear.  NECK:  Supple, no jugular venous distention. No thyroid enlargement, no tenderness.  LUNGS: Normal breath sounds bilaterally, no wheezing, rales,rhonchi or crepitation. No use of accessory muscles of respiration.  CARDIOVASCULAR: S1, S2 normal. No murmurs, rubs, or gallops.  ABDOMEN: Soft, nontender but discomfort in right upper quadrant, obese abdomen, nondistended. Bowel sounds present. No organomegaly or mass.  EXTREMITIES: No pedal edema, cyanosis, or clubbing.   NEUROLOGIC: Cranial nerves II through XII are intact. Muscle strength 5/5 in all extremities. Sensation intact. Gait not checked. Global weakness noted. PSYCHIATRIC: The patient is alert and awake SKIN: No obvious rash, lesion, or ulcer.    LABORATORY PANEL:   CBC  Recent Labs Lab 10/12/16 0632  WBC 5.9  HGB 10.9*  HCT 34.0*  PLT 274   ------------------------------------------------------------------------------------------------------------------  Chemistries   Recent Labs Lab 10/11/16 2145  10/13/16 0340  NA 139  < > 144  K 2.3*  < > 3.2*  CL 96*  < > 105  CO2 33*  < > 30  GLUCOSE 155*  < > 126*  BUN 10  < > 8  CREATININE 0.71  < > 0.62  CALCIUM 9.3  < > 8.9  MG  --   < > 2.1  AST 46*  --   --   ALT 21  --   --   ALKPHOS 79  --   --   BILITOT 0.6  --   --   < > = values  in this interval not displayed. ------------------------------------------------------------------------------------------------------------------  Cardiac Enzymes  Recent Labs Lab 10/11/16 2145  TROPONINI <0.03   ------------------------------------------------------------------------------------------------------------------  RADIOLOGY:  Dg Chest 2 View  Result Date: 10/11/2016 CLINICAL DATA:  Nausea and weakness EXAM: CHEST  2 VIEW COMPARISON:  10/07/2016 FINDINGS: Left-sided  dual lead pacing device with leads over the right atrium and right ventricle. Post sternotomy changes. Possible tiny right pleural effusion. Stable enlarged cardiomediastinal none with tortuous aorta. Hazy bibasilar opacity likely attributable to overlying soft tissues. No focal consolidation. No pneumothorax. IMPRESSION: 1. Stable degree of cardiomegaly.  No overt failure. 2. Possible tiny right pleural effusion 3. No focal consolidation Electronically Signed   By: Donavan Foil M.D.   On: 10/11/2016 21:43   Ct Abdomen Pelvis W Contrast  Result Date: 10/11/2016 CLINICAL DATA:  Epigastric pain nausea and weakness EXAM: CT ABDOMEN AND PELVIS WITH CONTRAST TECHNIQUE: Multidetector CT imaging of the abdomen and pelvis was performed using the standard protocol following bolus administration of intravenous contrast. CONTRAST:  166mL ISOVUE-300 IOPAMIDOL (ISOVUE-300) INJECTION 61% COMPARISON:  09/17/2016 FINDINGS: Lower chest: Hazy density within the lingula could reflect atelectasis or a mild infiltrate. No effusion. Mild subpleural opacities at the right middle lobe are unchanged, possible mild fibrosis. Heart is enlarged. Partially visualized pacer leads. Calcified mitral annulus. Hepatobiliary: Diffuse decreased density of the liver, consistent with fatty infiltration. Liver is slightly enlarged at 17 cm craniocaudad. No focal hepatic abnormalities. Small amount of high density material is present within the gallbladder, which may reflect small stones or sludge. No biliary dilatation. Pancreas: Unremarkable. No pancreatic ductal dilatation or surrounding inflammatory changes.Choose Spleen: Normal in size without focal abnormality.Small accessory splenule. Adrenals/Urinary Tract: Adrenal glands within normal limits. Punctate nonobstructing stones within the kidneys. Cortical hypodense lesion within the left kidney too small to further characterize, grossly unchanged. Small amount of air within the bladder.  Stomach/Bowel: The stomach is nondilated and contains possible small pill fragment. Duodenum lipoma unchanged. No dilated small bowel to suggest an obstruction. Mild colon diverticular disease without bowel inflammation. Transverse lie of the cecum. Appendix is normal. Vascular/Lymphatic: Atherosclerotic vascular calcifications but no aneurysm. No significantly enlarged lymph nodes. Reproductive: Uterus and bilateral adnexa are unremarkable. Other: No free air or free fluid Musculoskeletal: Patient is status post right hip replacement. Multilevel degenerative changes of the spine with mild anterior listhesis of L4 on L5 and mild retrolisthesis of L5 on S1. Vacuum discs at L4-L5 and L5-S1. No acute osseous abnormality. IMPRESSION: 1. Hazy density in the lingula could reflect atelectasis or mild infiltrate. 2. Slightly enlarged fatty liver. 3. Small amount of stones or high density sludge within the gallbladder. No wall thickening. No biliary dilatation. 4. Punctate nonobstructing stones within both kidneys. 5. No evidence for small bowel obstruction. 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 Normal appendix. Electronically Signed   By: Donavan Foil M.D.   On: 10/11/2016 23:22    EKG:   Orders placed or performed during the hospital encounter of 10/11/16  . EKG 12-Lead  . EKG 12-Lead  . EKG 12-Lead  . EKG 12-Lead    ASSESSMENT AND PLAN:   82 year old female with past medical history significant for severe aortic stenosis, LVH, CAD, chronic diastolic heart failure, history of atrial fibrillation status post pacemaker, GERD, anemia presents to hospital secondary to nausea, vomiting and weakness.  #1 Recurrent nausea with epigastric pain- improving with zofran - EGD Showed hiatal hernia. No significant findings. - Had prior admissions for the same. Continues to have nausea along with on  and off right upper quadrant pain. Check HIDA scan. Patient made nothing by mouth.  #2 Afib with rvr- known h/o Afib, s/p  pacemaker - rate controlled now, triggered by hypokalemia likely Due to atrail tachycardia history- last adm, started on cardizem- continue- increase dose - correct electrolytes - outpatient cardiology f/u  #3 Hypokalemia- replaced  #4 Diastolic CHF- stable, well compensated. on low-dose Lasix  #5 hypertension-on Cardizem by mouth.  #6 DVT prophylaxis- discontinue Lovenox in anticipation of EGD later today  All the records are reviewed and case discussed with Care Management/Social Workerr. Management plans discussed with the patient, family and they are in agreement.  CODE STATUS: DNR  TOTAL TIME TAKING CARE OF THIS PATIENT: 35 minutes.   POSSIBLE D/C IN 1-2 DAYS, DEPENDING ON CLINICAL CONDITION.  Hillary Bow R M.D on 10/13/2016 at 11:59 AM  Between 7am to 6pm - Pager - [phone]  After 6pm go to [email or web address] - password [email or web address] [place]  Office  [phone]  CC: Primary care physician; [name], MD

## 2016-10-13 NOTE — Progress Notes (Signed)
PT Cancellation Note  Patient Details Name: Michele Schultz MRN: [id number] DOB: 06-29-1931   Cancelled Treatment:    Reason Eval/Treat Not Completed: Pain limiting ability to participate;Patient declined, no reason specified   Pt in bed refusing session stating she had global pain "If you read my chart you would know all the things wrong with me."  Encouraged mobility and to get up to recliner at bedside in attempts to increase comfort due to reports of general back and shoulder pain but she refused "Not today".  Offered bedside exercises and she continued to decline.   Chesley Noon 10/13/2016, 12:25 PM

## 2016-10-13 NOTE — NC FL2 (Addendum)
[place] LEVEL OF CARE SCREENING TOOL     IDENTIFICATION  Patient Name: Michele Schultz Birthdate: [birth year] Sex: female Admission Date (Current Location): 10/11/2016  [facility] and [place] Number:  Engineering geologist and Address:  [facility], [address], [place], [place] [address]      Provider Number: [id number]  Attending Physician Name and Address:  Hillary Bow, MD  Relative Name and Phone Number:  Delrae Sawyers [phone] or Knight,Shelia Niece [phone]  [phone]    Current Level of Care: Hospital Recommended Level of Care: [place] Prior Approval Number:    Date Approved/Denied:   PASRR Number:    Discharge Plan: Domiciliary (Rest home) ([facility])    Current Diagnoses: Patient Active Problem List   Diagnosis Date Noted  . Nausea and vomiting   . Atrial fibrillation with RVR ([place]) 10/11/2016  . Hypokalemia 10/11/2016  . DNR (do not resuscitate) 09/19/2016  . Palliative care by specialist 09/19/2016  . Muscle weakness (generalized)   . Atrial tachycardia ([place])   . Diarrhea 09/17/2016  . Dehydration 09/17/2016  . Metabolic acidosis [id number]  . Headache due to trauma 07/03/2016  . Hypertensive heart disease   . Wide-complex tachycardia ([place])   . Pain in the chest   . Emesis   . Weakness of both legs   . Coronary artery disease involving coronary bypass graft of native heart with unspecified angina pectoris   . S/P AVR (aortic valve replacement)   . Falls frequently   . HLD (hyperlipidemia)   . Fibromyalgia   . RAD (reactive airway disease)   . Morbid obesity ([place])   . Aortic stenosis, severe   . Chronic pain 06/17/2014  . Pain in the muscles 06/19/2013  . Chest pain 06/19/2013  . Wheezing 06/19/2013  . Weakness generalized 06/03/2013  . Dyspnea 01/27/2013  . Nausea 03/05/2012  . Constipation 03/05/2012  . Atrial fibrillation-non sustained 02/09/2012  .  Cardiac pacemaker -st Judes   . Chronic diastolic heart failure ([place])   . Coronary artery disease   . Long term (current) use of anticoagulants 01/03/2012  . S/P CABG x 2 11/14/2011  . S/P aortic valve replacement with porcine valve 11/14/2011  . Back pain 11/14/2011  . Complete heart block ([place]) 10/20/2011  . HTN (hypertension) 09/15/2011  . Spinal stenosis 03/22/2011  . Fatigue 03/22/2011  . Hyperlipidemia 03/22/2011    Orientation RESPIRATION BLADDER Height & Weight     Situation, Place, Time, Self  Normal Incontinent Weight: 256 lb 4.8 oz (116.3 kg) Height:  5\' 4"  (162.6 cm)  BEHAVIORAL SYMPTOMS/MOOD NEUROLOGICAL BOWEL NUTRITION STATUS      Continent Diet (Cardiac)  AMBULATORY STATUS COMMUNICATION OF NEEDS Skin   Limited Assist Verbally Normal                       Personal Care Assistance Level of Assistance  Bathing, Feeding, Dressing Bathing Assistance: Limited assistance Feeding assistance: Independent Dressing Assistance: Limited assistance     Functional Limitations Info  Sight, Hearing, Speech Sight Info: Adequate Hearing Info: Adequate Speech Info: Adequate    SPECIAL CARE FACTORS FREQUENCY                     Contractures Contractures Info: Not present    Additional Factors Info  Code Status, Allergies Code Status Info: Full Code Allergies Info: CITALOPRAM, CYMBALTA DULOXETINE HCL, IMIPRAMINE, PROTON PUMP INHIBITORS, VENLAFAXINE  Current Medications (10/13/2016):  This is the current hospital active medication list Current Facility-Administered Medications  Medication Dose Route Frequency Provider Last Rate Last Dose  . acetaminophen (TYLENOL) tablet 650 mg  650 mg Oral Q6H PRN Lance Coon, MD   650 mg at 10/12/16 0248   Or  . acetaminophen (TYLENOL) suppository 650 mg  650 mg Rectal Q6H PRN Lance Coon, MD      . aspirin EC tablet 81 mg  81 mg Oral Daily Lance Coon, MD   81 mg at 10/13/16 0941  . atorvastatin (LIPITOR)  tablet 80 mg  80 mg Oral Daily Lance Coon, MD   80 mg at 10/13/16 0940  . diltiazem (CARDIZEM CD) 24 hr capsule 180 mg  180 mg Oral Daily Gladstone Lighter, MD   180 mg at 10/13/16 0941  . enoxaparin (LOVENOX) injection 40 mg  40 mg Subcutaneous Q24H Srikar Sudini, MD      . famotidine (PEPCID) IVPB 20 mg premix  20 mg Intravenous Q12H Gladstone Lighter, MD   20 mg at 10/13/16 0942  . furosemide (LASIX) tablet 20 mg  20 mg Oral Jeannie Done, MD   20 mg at 10/12/16 1000  . magnesium hydroxide (MILK OF MAGNESIA) suspension 30 mL  30 mL Oral Q8H PRN Alexis Hugelmeyer, DO   30 mL at 10/13/16 0053  . ondansetron (ZOFRAN) injection 4 mg  4 mg Intravenous Q6H Gladstone Lighter, MD   4 mg at 10/13/16 1153  . oxyCODONE (Oxy IR/ROXICODONE) immediate release tablet 5 mg  5 mg Oral Q4H PRN Lance Coon, MD   5 mg at 10/13/16 1153  . [START ON 10/14/2016] potassium chloride SA (K-DUR,KLOR-CON) CR tablet 20 mEq  20 mEq Oral BID Srikar Sudini, MD      . sodium chloride flush (NS) 0.9 % injection 3 mL  3 mL Intravenous Q12H Lance Coon, MD   3 mL at 10/13/16 0940  . traMADol (ULTRAM) tablet 50 mg  50 mg Oral Q6H PRN Lance Coon, MD   50 mg at 10/13/16 0940     Discharge Medications: Please see discharge summary for a list of discharge medications.  Current Discharge Medication List       CONTINUE these medications which have CHANGED   Details  promethazine (PHENERGAN) 12.5 MG tablet Take 1 tablet (12.5 mg total) by mouth every 6 (six) hours as needed for nausea or vomiting. Qty: 20 tablet, Refills: 0    traMADol (ULTRAM) 50 MG tablet Take 1 tablet (50 mg total) by mouth every 6 (six) hours as needed. Qty: 20 tablet, Refills: 0         CONTINUE these medications which have NOT CHANGED   Details  acetaminophen (TYLENOL) 325 MG tablet Take 650 mg by mouth 3 (three) times daily as needed.    acidophilus (RISAQUAD) CAPS capsule Take 1 capsule by mouth 2 (two) times daily. Qty: 1  capsule, Refills: 0    aspirin EC 81 MG tablet Take 81 mg by mouth daily.    atorvastatin (LIPITOR) 80 MG tablet Take 80 mg by mouth daily.    Calcium Carbonate-Vitamin D 600-400 MG-UNIT tablet Take 1 tablet by mouth daily.    Cholecalciferol 1000 units capsule Take 1,000 Units by mouth daily.    furosemide (LASIX) 20 MG tablet Take 1 tablet (20 mg total) by mouth every other day. Qty: 30 tablet, Refills: 6    HYDROcodone-acetaminophen (NORCO) 10-325 MG tablet Take 1 tablet by mouth every 4 (four) hours as  needed for moderate pain. Reported on 05/30/2016 Qty: 30 tablet, Refills: 0    magnesium hydroxide (MILK OF MAGNESIA) 400 MG/5ML suspension Take 10 mLs by mouth daily as needed for mild constipation.     metoCLOPramide (REGLAN) 5 MG tablet Take 1 tablet (5 mg total) by mouth every 6 (six) hours as needed for nausea or vomiting. Qty: 50 tablet, Refills: 0    nitroGLYCERIN (NITROSTAT) 0.4 MG SL tablet Place 1 tablet (0.4 mg total) under the tongue every 5 (five) minutes as needed for chest pain. Qty: 90 tablet, Refills: 3    ondansetron (ZOFRAN-ODT) 4 MG disintegrating tablet Take 1 tablet (4 mg total) by mouth every 8 (eight) hours as needed for nausea or vomiting. Qty: 30 tablet, Refills: 0    diltiazem (CARDIZEM CD) 120 MG 24 hr capsule Take 1 capsule (120 mg total) by mouth daily. Qty: 30 capsule, Refills: 0        Relevant Imaging Results:  Relevant Lab Results:   Additional Information SSN [id number]   [place] at facility, recommendation from PT.  Ross Ludwig

## 2016-10-14 ENCOUNTER — Encounter: Payer: Self-pay | Admitting: Radiology

## 2016-10-14 ENCOUNTER — Observation Stay: Payer: Medicare Other

## 2016-10-14 DIAGNOSIS — R112 Nausea with vomiting, unspecified: Secondary | ICD-10-CM | POA: Diagnosis not present

## 2016-10-14 MED ORDER — POTASSIUM CHLORIDE CRYS ER 20 MEQ PO TBCR
40.0000 meq | EXTENDED_RELEASE_TABLET | Freq: Once | ORAL | Status: AC
  Administered 2016-10-14: 40 meq via ORAL
  Filled 2016-10-14: qty 2

## 2016-10-14 MED ORDER — FENTANYL CITRATE (PF) 100 MCG/2ML IJ SOLN: 25.0000 ug | INTRAMUSCULAR | Status: DC | PRN

## 2016-10-14 MED ORDER — ONDANSETRON HCL 4 MG/2ML IJ SOLN
4.0000 mg | Freq: Once | INTRAMUSCULAR | Status: AC | PRN
  Administered 2016-10-14: 4 mg via INTRAVENOUS
  Filled 2016-10-14: qty 2

## 2016-10-14 MED ORDER — ONDANSETRON HCL 4 MG/2ML IJ SOLN
4.0000 mg | Freq: Four times a day (QID) | INTRAMUSCULAR | Status: DC | PRN
  Administered 2016-10-14 (×2): 4 mg via INTRAVENOUS
  Filled 2016-10-14 (×2): qty 2

## 2016-10-14 MED ORDER — ENOXAPARIN SODIUM 40 MG/0.4ML ~~LOC~~ SOLN
40.0000 mg | Freq: Two times a day (BID) | SUBCUTANEOUS | Status: DC
  Administered 2016-10-14 – 2016-10-21 (×14): 40 mg via SUBCUTANEOUS
  Filled 2016-10-14 (×15): qty 0.4

## 2016-10-14 MED ORDER — DEXTROSE-NACL 5-0.9 % IV SOLN
INTRAVENOUS | Status: DC
  Administered 2016-10-14 – 2016-10-15 (×2): via INTRAVENOUS

## 2016-10-14 MED ORDER — TECHNETIUM TC 99M MEBROFENIN IV KIT
5.8320 | PACK | Freq: Once | INTRAVENOUS | Status: AC | PRN
  Administered 2016-10-14: 5.832 via INTRAVENOUS

## 2016-10-14 NOTE — Progress Notes (Signed)
Called Gastroenterologist on call per Dr. Margaretmary Eddy to notify of patient's persistent nausea and ask for recommendations. MD did not answer. Left voicemail.

## 2016-10-14 NOTE — Progress Notes (Signed)
[facility] at [place] NAME: Michele Schultz    MR#:  [id number]  DATE OF BIRTH:  05-29-1931  SUBJECTIVE:  CHIEF COMPLAINT:   Chief Complaint  Patient presents with  . Nausea  . Weakness   - admitted again with Nausea with constipation and intermittent diarrhea. Patient is reporting persistent nausea and feeling sick denies any abdominal pain, reported intermittent episodes of vomiting HIDA scan normal. EGD showed nothing acute other than hiatal hernia.    REVIEW OF SYSTEMS:  Review of Systems  Constitutional: Positive for malaise/fatigue. Negative for chills and fever.  HENT: Negative for ear discharge, ear pain, hearing loss and nosebleeds.   Respiratory: Negative for cough, shortness of breath and wheezing.   Cardiovascular: Negative for chest pain, palpitations and leg swelling.  Gastrointestinal: Positive for abdominal pain, nausea and vomiting. Negative for constipation and diarrhea.  Genitourinary: Negative for dysuria.  Musculoskeletal: Positive for myalgias.  Neurological: Negative for dizziness, sensory change, speech change, focal weakness, seizures and headaches.  Psychiatric/Behavioral: Negative for depression.    DRUG ALLERGIES:   Allergies  Allergen Reactions  . Citalopram Other (See Comments)    Reaction:  Altered mental status   . Cymbalta [Duloxetine Hcl] Other (See Comments)    Reaction:  Sedative for pt   . Imipramine Other (See Comments)    Reaction:  Unknown   . Proton Pump Inhibitors Other (See Comments)    Reaction:  Unknown   . Venlafaxine Nausea And Vomiting and Other (See Comments)    Reaction:  Dizziness     VITALS:  Blood pressure (!) 162/75, pulse 88, temperature 98 F (36.7 C), temperature source Oral, resp. rate 19, height 5\' 4"  (1.626 m), weight 115.4 kg (254 lb 6.4 oz), SpO2 98 %.  PHYSICAL EXAMINATION:  Physical Exam  GENERAL:  82 y.o.-year-old obese patient lying in the bed. Distress  due to nausea EYES: Pupils equal, round, reactive to light and accommodation. No scleral icterus. Extraocular muscles intact.  HEENT: Head atraumatic, normocephalic. Oropharynx and nasopharynx clear.  NECK:  Supple, no jugular venous distention. No thyroid enlargement, no tenderness.  LUNGS: Normal breath sounds bilaterally, no wheezing, rales,rhonchi or crepitation. No use of accessory muscles of respiration.  CARDIOVASCULAR: S1, S2 normal. No murmurs, rubs, or gallops.  ABDOMEN: Soft, nontender but discomfort in right upper quadrant, obese abdomen, nondistended. Bowel sounds present. No organomegaly or mass.  EXTREMITIES: No pedal edema, cyanosis, or clubbing.   NEUROLOGIC: Cranial nerves II through XII are intact. Muscle strength 5/5 in all extremities. Sensation intact. Gait not checked. Global weakness noted. PSYCHIATRIC: The patient is alert and awake SKIN: No obvious rash, lesion, or ulcer.    LABORATORY PANEL:   CBC  Recent Labs Lab 10/12/16 0632  WBC 5.9  HGB 10.9*  HCT 34.0*  PLT 274   ------------------------------------------------------------------------------------------------------------------  Chemistries   Recent Labs Lab 10/11/16 2145  10/13/16 0340  NA 139  < > 144  K 2.3*  < > 3.2*  CL 96*  < > 105  CO2 33*  < > 30  GLUCOSE 155*  < > 126*  BUN 10  < > 8  CREATININE 0.71  < > 0.62  CALCIUM 9.3  < > 8.9  MG  --   < > 2.1  AST 46*  --   --   ALT 21  --   --   ALKPHOS 79  --   --   BILITOT 0.6  --   --   < > =  values in this interval not displayed. ------------------------------------------------------------------------------------------------------------------  Cardiac Enzymes  Recent Labs Lab 10/11/16 2145  TROPONINI <0.03   ------------------------------------------------------------------------------------------------------------------  RADIOLOGY:  Nm Hepatobiliary Liver Func  Result Date: 10/14/2016 CLINICAL DATA:  Patient with  history of nausea. EXAM: NUCLEAR MEDICINE HEPATOBILIARY IMAGING TECHNIQUE: Sequential images of the abdomen were obtained out to 60 minutes following intravenous administration of radiopharmaceutical. RADIOPHARMACEUTICALS:  5.832 mCi Tc-84m  Choletec IV COMPARISON:  CT abdomen pelvis 10/11/2016 FINDINGS: Prompt uptake and biliary excretion of activity by the liver is seen. Gallbladder activity is visualized, consistent with patency of cystic duct. Biliary activity passes into small bowel, consistent with patent common bile duct. IMPRESSION: No evidence for cystic duct obstruction. Electronically Signed   By: Lovey Newcomer M.D.   On: 10/14/2016 09:48    EKG:   Orders placed or performed during the hospital encounter of 10/11/16  . EKG 12-Lead  . EKG 12-Lead  . EKG 12-Lead  . EKG 12-Lead    ASSESSMENT AND PLAN:   82 year old female with past medical history significant for severe aortic stenosis, LVH, CAD, chronic diastolic heart failure, history of atrial fibrillation status post pacemaker, GERD, anemia presents to hospital secondary to nausea, vomiting and weakness.  #1 Recurrent nausea with epigastric pain-Not feeling good today -Nothing by mouth IV fluids, Pepcid IV twice a day -Follow up with GI -Stool for H. pylori - EGD Showed hiatal hernia. No significant findings. - Had prior admissions for the same. Continues to have nausea along with on and off right upper quadrant pain. Negative HIDA scan.   #2 Afib with rvr- known h/o Afib, s/p pacemaker - rate controlled now, triggered by hypokalemia likely Due to atrail tachycardia history- last adm, started on cardizem- continue- increase dose - correct electrolytes - outpatient cardiology f/u  #3 Hypokalemia- replaced  #4 Diastolic CHF- stable, well compensated. on low-dose Lasix  #5 hypertension-on Cardizem by mouth.  #6 DVT prophylaxis- Lovenox subcutaneous  Generalized weakness PT evaluation All the records are reviewed and case  discussed with Care Management/Social Workerr. Management plans discussed with the patient, family and they are in agreement.  CODE STATUS: DNR  TOTAL TIME TAKING CARE OF THIS PATIENT: 35 minutes.   POSSIBLE D/C IN 1-2 DAYS, DEPENDING ON CLINICAL CONDITION.  Nicholes Mango M.D on 10/14/2016 at 1:52 PM  Between 7am to 6pm - Pager - [phone]  After 6pm go to [email or web address] - password [email or web address] [facility]  Office  [phone]  CC: Primary care physician; [name], MD

## 2016-10-14 NOTE — Progress Notes (Signed)
Pharmacy Note - Anticoagulation  Patient with orders for enoxaparin 40mg  SQ Q24H for VTE prophylaxis  Body mass index is 43.67 kg/m. Estimated Creatinine Clearance: 66.5 mL/min (by C-G formula based on SCr of 0.62 mg/dL).  Will adjust to enoxaparin 40mg  SQ Q12H for BMI > 40 with CrCl > 17ml/min per protocol  Rexene Edison, PharmD Clinical Pharmacist  10/14/2016 2:52 PM

## 2016-10-15 DIAGNOSIS — R112 Nausea with vomiting, unspecified: Secondary | ICD-10-CM | POA: Diagnosis not present

## 2016-10-15 LAB — BASIC METABOLIC PANEL
Anion gap: 9 (ref 5–15)
BUN: 6 mg/dL (ref 6–20)
CALCIUM: 8.6 mg/dL — AB (ref 8.9–10.3)
CHLORIDE: 105 mmol/L (ref 101–111)
CO2: 26 mmol/L (ref 22–32)
CREATININE: 0.6 mg/dL (ref 0.44–1.00)
GFR calc Af Amer: >60 mL/min (ref >60)
GFR calc non Af Amer: >60 mL/min (ref >60)
GLUCOSE: 146 mg/dL — AB (ref 65–99)
Potassium: 3.2 mmol/L — ABNORMAL LOW (ref 3.5–5.1)
Sodium: 140 mmol/L (ref 135–145)

## 2016-10-15 MED ORDER — KCL IN DEXTROSE-NACL 20-5-0.9 MEQ/L-%-% IV SOLN
INTRAVENOUS | Status: AC
  Administered 2016-10-15: 13:00:00 via INTRAVENOUS
  Filled 2016-10-15: qty 1000

## 2016-10-15 MED ORDER — METOCLOPRAMIDE HCL 5 MG/5ML PO SOLN
10.0000 mg | Freq: Three times a day (TID) | ORAL | Status: DC
  Administered 2016-10-15 – 2016-10-21 (×19): 10 mg via ORAL
  Filled 2016-10-15 (×21): qty 10

## 2016-10-15 MED ORDER — KCL IN DEXTROSE-NACL 20-5-0.9 MEQ/L-%-% IV SOLN: INTRAVENOUS | Status: DC

## 2016-10-15 MED ORDER — ONDANSETRON HCL 4 MG/2ML IJ SOLN
4.0000 mg | Freq: Four times a day (QID) | INTRAMUSCULAR | Status: DC | PRN
  Administered 2016-10-15 – 2016-10-17 (×3): 4 mg via INTRAVENOUS
  Filled 2016-10-15 (×4): qty 2

## 2016-10-15 MED ORDER — POTASSIUM CHLORIDE CRYS ER 20 MEQ PO TBCR
40.0000 meq | EXTENDED_RELEASE_TABLET | ORAL | Status: AC
  Administered 2016-10-15 (×2): 40 meq via ORAL
  Filled 2016-10-15 (×2): qty 2

## 2016-10-15 MED ORDER — PROMETHAZINE HCL 12.5 MG RE SUPP
12.5000 mg | Freq: Four times a day (QID) | RECTAL | Status: DC | PRN
  Filled 2016-10-15: qty 1

## 2016-10-15 MED ORDER — ONDANSETRON HCL 4 MG/2ML IJ SOLN: 4.0000 mg | Freq: Four times a day (QID) | INTRAMUSCULAR | Status: DC

## 2016-10-15 NOTE — Progress Notes (Addendum)
[facility] at [place] NAME: Michele Schultz    MR#:  [id number]  DATE OF BIRTH:  28-Mar-1931  SUBJECTIVE:  CHIEF COMPLAINT:   Chief Complaint  Patient presents with  . Nausea  . Weakness   - admitted again with Nausea with constipation and intermittent diarrhea. Patient is reporting persistent nausea and feeling sick denies any abdominal pain, reported intermittent episodes of vomiting,No one witnessed as reported by the nurse HIDA scan normal. EGD showed nothing acute other than hiatal hernia. Patient is really hungry today and wants to eat though she is nauseous and vomiting intermittently    REVIEW OF SYSTEMS:  Review of Systems  Constitutional: Positive for malaise/fatigue. Negative for chills and fever.  HENT: Negative for ear discharge, ear pain, hearing loss and nosebleeds.   Respiratory: Negative for cough, shortness of breath and wheezing.   Cardiovascular: Negative for chest pain, palpitations and leg swelling.  Gastrointestinal: Positive for nausea and vomiting. Negative for abdominal pain, constipation and diarrhea.  Genitourinary: Negative for dysuria.  Musculoskeletal: Positive for myalgias.  Neurological: Negative for dizziness, sensory change, speech change, focal weakness, seizures and headaches.  Psychiatric/Behavioral: Negative for depression.    DRUG ALLERGIES:   Allergies  Allergen Reactions  . Citalopram Other (See Comments)    Reaction:  Altered mental status   . Cymbalta [Duloxetine Hcl] Other (See Comments)    Reaction:  Sedative for pt   . Imipramine Other (See Comments)    Reaction:  Unknown   . Proton Pump Inhibitors Other (See Comments)    Reaction:  Unknown   . Venlafaxine Nausea And Vomiting and Other (See Comments)    Reaction:  Dizziness     VITALS:  Blood pressure 126/68, pulse 87, temperature 98.5 F (36.9 C), temperature source Oral, resp. rate 18, height 5\' 4"  (1.626 m), weight 115.4 kg  (254 lb 6.4 oz), SpO2 100 %.  PHYSICAL EXAMINATION:  Physical Exam  GENERAL:  82 y.o.-year-old obese patient lying in the bed. Distress due to nausea EYES: Pupils equal, round, reactive to light and accommodation. No scleral icterus. Extraocular muscles intact.  HEENT: Head atraumatic, normocephalic. Oropharynx and nasopharynx clear.  NECK:  Supple, no jugular venous distention. No thyroid enlargement, no tenderness.  LUNGS: Normal breath sounds bilaterally, no wheezing, rales,rhonchi or crepitation. No use of accessory muscles of respiration.  CARDIOVASCULAR: S1, S2 normal. No murmurs, rubs, or gallops.  ABDOMEN: Soft, nontender but discomfort in right upper quadrant, obese abdomen, nondistended. Bowel sounds present. No organomegaly or mass.  EXTREMITIES: No pedal edema, cyanosis, or clubbing.   NEUROLOGIC: Cranial nerves II through XII are intact. Muscle strength 5/5 in all extremities. Sensation intact. Gait not checked. Global weakness noted. PSYCHIATRIC: The patient is alert and awake SKIN: No obvious rash, lesion, or ulcer.    LABORATORY PANEL:   CBC  Recent Labs Lab 10/12/16 0632  WBC 5.9  HGB 10.9*  HCT 34.0*  PLT 274   ------------------------------------------------------------------------------------------------------------------  Chemistries   Recent Labs Lab 10/11/16 2145  10/13/16 0340 10/15/16 0525  NA 139  < > 144 140  K 2.3*  < > 3.2* 3.2*  CL 96*  < > 105 105  CO2 33*  < > 30 26  GLUCOSE 155*  < > 126* 146*  BUN 10  < > 8 6  CREATININE 0.71  < > 0.62 0.60  CALCIUM 9.3  < > 8.9 8.6*  MG  --   < > 2.1  --  AST 46*  --   --   --   ALT 21  --   --   --   ALKPHOS 79  --   --   --   BILITOT 0.6  --   --   --   < > = values in this interval not displayed. ------------------------------------------------------------------------------------------------------------------  Cardiac Enzymes  Recent Labs Lab 10/11/16 2145  TROPONINI <0.03    ------------------------------------------------------------------------------------------------------------------  RADIOLOGY:  Nm Hepatobiliary Liver Func  Result Date: 10/14/2016 CLINICAL DATA:  Patient with history of nausea. EXAM: NUCLEAR MEDICINE HEPATOBILIARY IMAGING TECHNIQUE: Sequential images of the abdomen were obtained out to 60 minutes following intravenous administration of radiopharmaceutical. RADIOPHARMACEUTICALS:  5.832 mCi Tc-14m  Choletec IV COMPARISON:  CT abdomen pelvis 10/11/2016 FINDINGS: Prompt uptake and biliary excretion of activity by the liver is seen. Gallbladder activity is visualized, consistent with patency of cystic duct. Biliary activity passes into small bowel, consistent with patent common bile duct. IMPRESSION: No evidence for cystic duct obstruction. Electronically Signed   By: Lovey Newcomer M.D.   On: 10/14/2016 09:48    EKG:   Orders placed or performed during the hospital encounter of 10/11/16  . EKG 12-Lead  . EKG 12-Lead  . EKG 12-Lead  . EKG 12-Lead    ASSESSMENT AND PLAN:   82 year old female with past medical history significant for severe aortic stenosis, LVH, CAD, chronic diastolic heart failure, history of atrial fibrillation status post pacemaker, GERD, anemia presents to hospital secondary to nausea, vomiting and weakness.  #1 Recurrent nausea with epigastric pain-Not feeling good today -Patient is demanding to eat though she is nauseous and she is hungry started her on clear liquid diet and will advance as tolerated -Follow up with GI, discussed with Dr. Truett Perna -Zofran every 6 hoursAs needed,Reglan 10 mg 3 times a day per GI recommendations  for persistent nausea -Stool for H. pylori is still pending - EGD Showed hiatal hernia. No significant findings. - Had prior admissions for the same. Continues to have nausea along with on and off right upper quadrant pain. Negative HIDA scan.  -Out of bed with assistance each shift as  tolerated  #2 Afib with rvr- known h/o Afib, s/p pacemaker - rate controlled now, triggered by hypokalemia likely, we will correct hypokalemia Due to atrail tachycardia history- last adm, started on cardizem- continue- increase dose - correct electrolytes - outpatient cardiology f/u  #3 Hypokalemia- replete  #4 Diastolic CHF- stable, well compensated. on low-dose Lasix  #5 hypertension-on Cardizem by mouth.  #6 DVT prophylaxis- Lovenox subcutaneous  Generalized weakness PT evaluation All the records are reviewed and case discussed with Care Management/Social Workerr. Management plans discussed with the patient,nicece Shila at [phone] and they are in agreement.  CODE STATUS: DNR  TOTAL TIME TAKING CARE OF THIS PATIENT: 35 minutes.   POSSIBLE D/C IN 1-2 DAYS, DEPENDING ON CLINICAL CONDITION.  Nicholes Mango M.D on 10/15/2016 at 9:31 AM  Between 7am to 6pm - Pager - [phone]  After 6pm go to [email or web address] - password [email or web address] [facility]  Office  [phone]  CC: Primary care physician; [name], MD

## 2016-10-16 DIAGNOSIS — R112 Nausea with vomiting, unspecified: Secondary | ICD-10-CM | POA: Diagnosis not present

## 2016-10-16 LAB — BASIC METABOLIC PANEL
ANION GAP: 8 (ref 5–15)
BUN: 5 mg/dL — ABNORMAL LOW (ref 6–20)
CO2: 23 mmol/L (ref 22–32)
Calcium: 8.8 mg/dL — ABNORMAL LOW (ref 8.9–10.3)
Chloride: 107 mmol/L (ref 101–111)
Creatinine, Ser: 0.62 mg/dL (ref 0.44–1.00)
GFR calc Af Amer: >60 mL/min (ref >60)
Glucose, Bld: 140 mg/dL — ABNORMAL HIGH (ref 65–99)
POTASSIUM: 4 mmol/L (ref 3.5–5.1)
SODIUM: 138 mmol/L (ref 135–145)

## 2016-10-16 LAB — MAGNESIUM: MAGNESIUM: 2 mg/dL (ref 1.7–2.4)

## 2016-10-16 MED ORDER — ONDANSETRON HCL 4 MG PO TABS
4.0000 mg | ORAL_TABLET | Freq: Once | ORAL | Status: AC
  Administered 2016-10-16: 4 mg via ORAL
  Filled 2016-10-16: qty 1

## 2016-10-16 MED ORDER — PROMETHAZINE HCL 12.5 MG PO TABS: 12.5000 mg | ORAL_TABLET | Freq: Four times a day (QID) | ORAL | 0 refills | Status: DC | PRN

## 2016-10-16 MED ORDER — TRAMADOL HCL 50 MG PO TABS: 50.0000 mg | ORAL_TABLET | Freq: Four times a day (QID) | ORAL | 0 refills | Status: DC | PRN

## 2016-10-16 NOTE — Discharge Instructions (Signed)

## 2016-10-16 NOTE — Discharge Summary (Addendum)
Michele Schultz NAME: Michele Schultz    MR#:  [id number]  DATE OF BIRTH:  February 14, 1931  DATE OF ADMISSION:  10/11/2016 ADMITTING PHYSICIAN: Lance Coon, MD  DATE OF DISCHARGE: 10/16/16  PRIMARY CARE PHYSICIAN: [name], MD    ADMISSION DIAGNOSIS:  Hypokalemia [E87.6] Lactic acidosis [E87.2] Nausea and vomiting, intractability of vomiting not specified, unspecified vomiting type [R11.2]  DISCHARGE DIAGNOSIS:  Principal Problem:   Atrial fibrillation with RVR ([place]) Active Problems:   HTN (hypertension)   Chronic diastolic heart failure (HCC)   Coronary artery disease   Hypokalemia   Nausea and vomiting   SECONDARY DIAGNOSIS:   Past Medical History:  Diagnosis Date  . Anemia   . Aortic stenosis, severe    a. s/p Magna Ease pericardial tissue valve size 21 mm replacement in 10/2011 for severe AS 11/12; b. echo 07/2015: EF 55-60% mod concentric LVH, GR1DD, LA mildly dilated, PASP 45 mm Hg  . Atrial tachycardia, paroxysmal (HCC)    a. with rate related LBBB.  . Cardiac pacemaker -st Judes    11/12  . Chronic diastolic heart failure ([place])    a. echo 2014: EF 55-60%, no RWMA, GR1DD, PASP 47 mm Hg; b. echo 07/2015: EF 55-60% mod concentric LVH, GR1DD, LA mildly dilated, PASP 45 mm Hg  . Complete heart block [facility])    a. s/p St Jude PPM 10/2011 (Ser # [id number]).  . Coronary artery disease    a. s/p 2v CABG 11/12 (VG-LAD, VG-OM1); b. Lexiscan 08/2015: low risk, no ischemia, EF 55-65%.  . Enthesopathy of hip region   . Esophageal reflux    followed by Dr.Seigal. stabilized with a combination of Nexium and Zantac  . Fibromyalgia   . HLD (hyperlipidemia)   . Hypertensive heart disease   . Knee joint replacement by other means   . Morbid obesity ([place])   . Neuralgia, neuritis, and radiculitis, unspecified   . Neuropathy ([place])   . Onychia and paronychia of toe   . Osteoarthrosis, unspecified whether generalized or  localized, pelvic region and thigh    mainly in her back and knees  . PAF (paroxysmal atrial fibrillation) ([place])    a. brief episodes of AF previously noted on device interrogations.  [place] [facility] RAD (reactive airway disease)    a. chronic SOB  . Spinal stenosis   . Stress incontinence, female    followed by Dr.Cope  . Type II or unspecified type diabetes mellitus without mention of complication, not stated as uncontrolled    a. pt. reports that she is borderline   . Wide-complex tachycardia ([place])    a. Noted 4/10 - brief episode in ED. Noted again 4/21 in clinic->felt most likely to be atrial tach.    HOSPITAL COURSE:   Michele Schultz  is a 82 y.o. female who presents with Nausea and weakness, on arrival to the ED she was in A. fib with RVR. She responded well to medications in the ED to control her heart rate, but had persistent nausea weakness, and clinical dehydration. Hospitals were called for admission and further treatment.Please review history and physical for complete details  #1 Recurrent nausea with epigastric pain- resolved and tolerating regular diet -Zofran every 6 hoursAs needed,Reglan 10 mg 3 times a day per GI were given during the hospital course .Will discharge patient home with Reglan 5 mg as needed as recommended by GI  for persistent nausea, Phenergan and Zofran which are her home medications -  Stool for H. pylori  test done results are pending and to be followed up by primary care physician - EGD Showed hiatal hernia. No significant findings. - Had prior admissions for the same. Continues to have nausea along with on and off right upper quadrant pain. Negative HIDA scan.    #2 Afib with rvr- known h/o Afib, s/p pacemaker - rate controlled now, triggered by hypokalemia likely, we will correct hypokalemia Due to atrail tachycardia history- last adm, started on cardizem- continue- increase dose - correct electrolytes - outpatient cardiology f/u  #3 Hypokalemia- repleteD,  POTASSIUM AT 4  #4 Diastolic CHF- stable, well compensated. on low-dose Lasix  #5 hypertension-on Cardizem by mouth.  #6 DVT prophylaxis- Lovenox subcutaneous  PT has recommended home health PT at assisted living facility  DISCHARGE CONDITIONS:    Stable  CONSULTS OBTAINED:  Treatment Team:  Lucilla Lame, MD   PROCEDURES EGD hiatal hernia  DRUG ALLERGIES:   Allergies  Allergen Reactions  . Citalopram Other (See Comments)    Reaction:  Altered mental status   . Cymbalta [Duloxetine Hcl] Other (See Comments)    Reaction:  Sedative for pt   . Imipramine Other (See Comments)    Reaction:  Unknown   . Proton Pump Inhibitors Other (See Comments)    Reaction:  Unknown   . Venlafaxine Nausea And Vomiting and Other (See Comments)    Reaction:  Dizziness     DISCHARGE MEDICATIONS:   Current Discharge Medication List    CONTINUE these medications which have CHANGED   Details  promethazine (PHENERGAN) 12.5 MG tablet Take 1 tablet (12.5 mg total) by mouth every 6 (six) hours as needed for nausea or vomiting. Qty: 20 tablet, Refills: 0    traMADol (ULTRAM) 50 MG tablet Take 1 tablet (50 mg total) by mouth every 6 (six) hours as needed. Qty: 20 tablet, Refills: 0      CONTINUE these medications which have NOT CHANGED   Details  acetaminophen (TYLENOL) 325 MG tablet Take 650 mg by mouth 3 (three) times daily as needed.    acidophilus (RISAQUAD) CAPS capsule Take 1 capsule by mouth 2 (two) times daily. Qty: 1 capsule, Refills: 0    aspirin EC 81 MG tablet Take 81 mg by mouth daily.    atorvastatin (LIPITOR) 80 MG tablet Take 80 mg by mouth daily.    Calcium Carbonate-Vitamin D 600-400 MG-UNIT tablet Take 1 tablet by mouth daily.    Cholecalciferol 1000 units capsule Take 1,000 Units by mouth daily.    furosemide (LASIX) 20 MG tablet Take 1 tablet (20 mg total) by mouth every other day. Qty: 30 tablet, Refills: 6    HYDROcodone-acetaminophen (NORCO) 10-325 MG  tablet Take 1 tablet by mouth every 4 (four) hours as needed for moderate pain. Reported on 05/30/2016 Qty: 30 tablet, Refills: 0    magnesium hydroxide (MILK OF MAGNESIA) 400 MG/5ML suspension Take 10 mLs by mouth daily as needed for mild constipation.     metoCLOPramide (REGLAN) 5 MG tablet Take 1 tablet (5 mg total) by mouth every 6 (six) hours as needed for nausea or vomiting. Qty: 50 tablet, Refills: 0    nitroGLYCERIN (NITROSTAT) 0.4 MG SL tablet Place 1 tablet (0.4 mg total) under the tongue every 5 (five) minutes as needed for chest pain. Qty: 90 tablet, Refills: 3    ondansetron (ZOFRAN-ODT) 4 MG disintegrating tablet Take 1 tablet (4 mg total) by mouth every 8 (eight) hours as needed for nausea or vomiting.  Qty: 30 tablet, Refills: 0    diltiazem (CARDIZEM CD) 120 MG 24 hr capsule Take 1 capsule (120 mg total) by mouth daily. Qty: 30 capsule, Refills: 0         DISCHARGE INSTRUCTIONS:   Follow-up with primary care physician at the facility in a week  DIET:  Cardiac diet  DISCHARGE CONDITION:  Fair  ACTIVITY:  Activity as tolerated per PT  OXYGEN:  Home Oxygen: No.   Oxygen Delivery: room air  DISCHARGE LOCATION:  Assisted-living facility with home health   If you experience worsening of your admission symptoms, develop shortness of breath, life threatening emergency, suicidal or homicidal thoughts you must seek medical attention immediately by calling 911 or calling your MD immediately  if symptoms less severe.  You Must read complete instructions/literature along with all the possible adverse reactions/side effects for all the Medicines you take and that have been prescribed to you. Take any new Medicines after you have completely understood and accpet all the possible adverse reactions/side effects.   Please note  You were cared for by a hospitalist during your hospital stay. If you have any questions about your discharge medications or the care you received  while you were in the hospital after you are discharged, you can call the unit and asked to speak with the hospitalist on call if the hospitalist that took care of you is not available. Once you are discharged, your primary care physician will handle any further medical issues. Please note that NO REFILLS for any discharge medications will be authorized once you are discharged, as it is imperative that you return to your primary care physician (or establish a relationship with a primary care physician if you do not have one) for your aftercare needs so that they can reassess your need for medications and monitor your lab values.     Today  Chief Complaint  Patient presents with  . Nausea  . Weakness   Patient's nausea and vomiting significantly improved and tolerating diet and denies any abdominal pain, evaluated by physical therapy who has recommended home PT  ROS:  CONSTITUTIONAL: Denies fevers, chills. Denies any fatigue. Reports generalized weakness EYES: Denies blurry vision, double vision, eye pain. EARS, NOSE, THROAT: Denies tinnitus, ear pain, hearing loss. RESPIRATORY: Denies cough, wheeze, shortness of breath.  CARDIOVASCULAR: Denies chest pain, palpitations, edema.  GASTROINTESTINAL: Denies nausea, vomiting, diarrhea, abdominal pain. Denies bright red blood per rectum. GENITOURINARY: Denies dysuria, hematuria. ENDOCRINE: Denies nocturia or thyroid problems. HEMATOLOGIC AND LYMPHATIC: Denies easy bruising or bleeding. SKIN: Denies rash or lesion. MUSCULOSKELETAL: Denies pain in neck, back, shoulder, knees, hips or arthritic symptoms.  NEUROLOGIC: Denies paralysis, paresthesias.  PSYCHIATRIC: Denies anxiety or depressive symptoms.   VITAL SIGNS:  Blood pressure (!) 153/87, pulse (!) 101, temperature 98.2 F (36.8 C), temperature source Oral, resp. rate 16, height 5\' 4"  (1.626 m), weight 114.9 kg (253 lb 4.8 oz), SpO2 (!) 76 %.  I/O:    Intake/Output Summary (Last 24  hours) at 10/16/16 1343 Last data filed at 10/16/16 0956  Gross per 24 hour  Intake              990 ml  Output                0 ml  Net              990 ml    PHYSICAL EXAMINATION:  GENERAL:  82 y.o.-year-old patient lying in the bed with no acute  distress.  EYES: Pupils equal, round, reactive to light and accommodation. No scleral icterus. Extraocular muscles intact.  HEENT: Head atraumatic, normocephalic. Oropharynx and nasopharynx clear.  NECK:  Supple, no jugular venous distention. No thyroid enlargement, no tenderness.  LUNGS: Normal breath sounds bilaterally, no wheezing, rales,rhonchi or crepitation. No use of accessory muscles of respiration.  CARDIOVASCULAR: S1, S2 normal. No murmurs, rubs, or gallops.  ABDOMEN: Soft, non-tender, non-distended. Bowel sounds present. No organomegaly or mass.  EXTREMITIES: No pedal edema, cyanosis, or clubbing.  NEUROLOGIC: Cranial nerves II through XII are intact. Muscle strength 5/5 in all extremities. Sensation intact. Gait not checked.  PSYCHIATRIC: The patient is alert and oriented x 3.  SKIN: No obvious rash, lesion, or ulcer.   DATA REVIEW:   CBC  Recent Labs Lab 10/12/16 0632  WBC 5.9  HGB 10.9*  HCT 34.0*  PLT 274    Chemistries   Recent Labs Lab 10/11/16 2145  10/16/16 0523  NA 139  < > 138  K 2.3*  < > 4.0  CL 96*  < > 107  CO2 33*  < > 23  GLUCOSE 155*  < > 140*  BUN 10  < > 5*  CREATININE 0.71  < > 0.62  CALCIUM 9.3  < > 8.8*  MG  --   < > 2.0  AST 46*  --   --   ALT 21  --   --   ALKPHOS 79  --   --   BILITOT 0.6  --   --   < > = values in this interval not displayed.  Cardiac Enzymes  Recent Labs Lab 10/11/16 2145  TROPONINI <0.03    Microbiology Results  Results for orders placed or performed during the hospital encounter of 10/11/16  MRSA PCR Screening     Status: None   Collection Time: 10/12/16  1:35 AM  Result Value Ref Range Status   MRSA by PCR NEGATIVE NEGATIVE Final    Comment:         The GeneXpert MRSA Assay (FDA approved for NASAL specimens only), is one component of a comprehensive MRSA colonization surveillance program. It is not intended to diagnose MRSA infection nor to guide or monitor treatment for MRSA infections.     RADIOLOGY:  Nm Hepatobiliary Liver Func  Result Date: 10/14/2016 CLINICAL DATA:  Patient with history of nausea. EXAM: NUCLEAR MEDICINE HEPATOBILIARY IMAGING TECHNIQUE: Sequential images of the abdomen were obtained out to 60 minutes following intravenous administration of radiopharmaceutical. RADIOPHARMACEUTICALS:  5.832 mCi Tc-47m  Choletec IV COMPARISON:  CT abdomen pelvis 10/11/2016 FINDINGS: Prompt uptake and biliary excretion of activity by the liver is seen. Gallbladder activity is visualized, consistent with patency of cystic duct. Biliary activity passes into small bowel, consistent with patent common bile duct. IMPRESSION: No evidence for cystic duct obstruction. Electronically Signed   By: Lovey Newcomer M.D.   On: 10/14/2016 09:48    EKG:   Orders placed or performed during the hospital encounter of 10/11/16  . EKG 12-Lead  . EKG 12-Lead  . EKG 12-Lead  . EKG 12-Lead      Management plans discussed with the patient, She is in agreement.  CODE STATUS:     Code Status Orders        Start     Ordered   10/12/16 0137  Do not attempt resuscitation (DNR)  Continuous    Question Answer Comment  In the event of cardiac or respiratory ARREST Do not call  a "code blue"   In the event of cardiac or respiratory ARREST Do not perform Intubation, CPR, defibrillation or ACLS   In the event of cardiac or respiratory ARREST Use medication by any route, position, wound care, and other measures to relive pain and suffering. May use oxygen, suction and manual treatment of airway obstruction as needed for comfort.      10/12/16 0136    Code Status History    Date Active Date Inactive Code Status Order ID Comments User Context    09/19/2016 10:52 AM 09/22/2016  8:50 PM DNR [id number]  Knox Royalty, NP Inpatient   09/17/2016  5:57 PM 09/19/2016 10:52 AM Full Code [id number]  Theodoro Grist, MD Inpatient   06/23/2016  8:20 PM 06/26/2016  1:26 PM Full Code [id number]  Gladstone Lighter, MD Inpatient   06/16/2016  8:35 PM 06/21/2016 10:09 PM Full Code [id number]  Lytle Butte, MD ED   03/13/2016  8:15 PM 03/14/2016  9:02 PM Full Code [id number]  Henreitta Leber, MD Inpatient   09/13/2015  6:51 PM 09/18/2015  7:31 PM Full Code [id number]  Dustin Flock, MD Inpatient   10/20/2011  1:40 PM 10/22/2011  1:27 PM Full Code [id number]  Tiajuana Amass, RN Inpatient    Advance Directive Documentation   Flowsheet Row Most Recent Value  Type of Advance Directive  Out of facility DNR (pink MOST or yellow form)  Pre-existing out of facility DNR order (yellow form or pink MOST form)  No data  "MOST" Form in Place?  No data      TOTAL TIME TAKING CARE OF THIS PATIENT: 45  minutes.   Note: This dictation was prepared with Dragon dictation along with smaller phrase technology. Any transcriptional errors that result from this process are unintentional.   @[facility] @  on 10/16/2016 at 1:43 PM  Between 7am to 6pm - Pager - [phone]  After 6pm go to [email or web address] - password [email or web address] [place] [facility]  Office  [phone]  CC: Primary care physician; [name], MD   Addendum to this dictation done 10/21/2016 at 10:28 AM. Patient has been medically stable for discharge since 10/19/2016 that has been waiting for an accepting facility with bed availability. Her medical condition remained stable and is unchanged from the original dictated discharge summary.  [employer], D.O.

## 2016-10-16 NOTE — Progress Notes (Signed)
Chaplain did a joint visit as it was an assignment for interns. Chaplains had a good conversation with patient and we concluded the visit with a prayer.

## 2016-10-16 NOTE — Progress Notes (Signed)
Physical Therapy Treatment Patient Details Name: Michele Schultz MRN: [id number] DOB: 11-11-1931 Today's Date: 10/16/2016    History of Present Illness Michele Schultz  is a 82 y.o. female who presents with Nausea and weakness, on arrival to the ED she was in A. fib with RVR. She responded well to medications in the ED to control her heart rate, but had persistent nausea weakness, and clinical dehydration. Pt admitted under observation status for recurrent nausea with epigastric pain, afib with RVR, and hypokalemia. Pt denies falls in the last 12 months    PT Comments    Pt requiring max assist supine to sit (bed flat without any side rails) but only CGA with stand step turn bed to recliner (pt steady and safe with transfer without AD).  Pt reports needing assist with bed mobility prior to admission but was able to transfer on her own sometimes (pt reports not walking for past few weeks).  Per notes pt lives at [facility].  Recommend pt discharge back to ALF with HHPT and assist with bed mobility.  CM and MD notified.   Follow Up Recommendations  Other (comment);Home health PT (Return to [facility])     Equipment Recommendations   (pt reports having RW and w/c at ALF)    Recommendations for Other Services       Precautions / Restrictions Precautions Precautions: Fall Restrictions Weight Bearing Restrictions: No    Mobility  Bed Mobility Overal bed mobility: Needs Assistance Bed Mobility: Supine to Sit     Supine to sit: Max assist (bed flat); no bed rail use     General bed mobility comments: Pt requiring assist for trunk and B LE's supine to sit; vc's for technique; limited d/t c/o low back pain (chronic)  Transfers Overall transfer level: Needs assistance Equipment used: None Transfers: Sit to/from [facility] Sit to Stand: Min guard Stand pivot transfers: Min guard       General transfer comment: pt stood (3/4th stand) with L UE  support on chair armrest, then reached with R UE for R chair armrest, then perform stand step turn with B UE support bed to chair; steady without loss of balance; no vc's given (pt performing like she does at home)  Ambulation/Gait             General Gait Details: deferred d/t pt reporting not walking for past few weeks and has been w/c level recently   Stairs            Wheelchair Mobility    Modified Rankin (Stroke Patients Only)       Balance Overall balance assessment: Needs assistance Sitting-balance support: Bilateral upper extremity supported;Feet supported Sitting balance-Leahy Scale: Good     Standing balance support: Bilateral upper extremity supported;During functional activity Standing balance-Leahy Scale: Good Standing balance comment: stand step turn bed to chair                    Cognition Arousal/Alertness: Awake/alert Behavior During Therapy: Anxious Overall Cognitive Status:  (Oriented to person, place, situation)                      Exercises      General Comments General comments (skin integrity, edema, etc.): Pt laying in bed upon PT arrival.  Nursing cleared pt for participation in physical therapy.  Pt agreeable to PT session.      Pertinent Vitals/Pain Pain Assessment: 0-10 Pain Score: 4  Pain Location: chronic back pain Pain Descriptors / Indicators: Sore Pain Intervention(s): Limited activity within patient's tolerance;Monitored during session;Repositioned  See flow sheet for HR and O2 vitals.    Home Living                      Prior Function            PT Goals (current goals can now be found in the care plan section) Acute Rehab PT Goals Patient Stated Goal: Return to prior function PT Goal Formulation: With patient Time For Goal Achievement: 10/26/16 Potential to Achieve Goals: Fair Additional Goals Additional Goal #1: Pt will be able to perform bed mobility/transfers with cga and RW in  order to improve functional independence Progress towards PT goals: Progressing toward goals    Frequency    Min 2X/week      PT Plan Current plan remains appropriate    Co-evaluation             End of Session Equipment Utilized During Treatment: Gait belt Activity Tolerance: Patient tolerated treatment well Patient left: in chair;with call bell/phone within reach;with chair alarm set     Time: [id number] PT Time Calculation (min) (ACUTE ONLY): 25 min  Charges:  $Therapeutic Activity: 23-37 mins                    G CodesLeitha Bleak 22-Oct-2016, 11:48 AM Leitha Bleak, [place]

## 2016-10-16 NOTE — Progress Notes (Addendum)
3:17 PM LCSW spoke with facility regarding patient return. Facility feels she needs a higher level of care. LCSW explained recommendations and faxed over clinicals for review as patient is in observation status and does not have recommendations for SNF. [facility] is reviewing and will follow up. They are aware of discharge and [facility] is [facility]. Facility confirms they have received paperwork and reviewing. They have a call into the protocol RN and Jenny Reichmann reports she will call LCSW back with final disposition on patient.   LCSW is aware of discharge and working to have patient return to the facility. Call placed to [facility] and unable to reach appropriate contacts. Development worker, international aid will be calling LCSW back as information was taken down regarding difficulties in speaking with staff.  LCSW has updated FL2. Once facility is reached and can review, will determine next steps for discharge and disposition. LCSW will notify family regarding plan.  Lane Hacker, MSW Clinical Social Work: [employer] Coverage for :  [phone]

## 2016-10-16 NOTE — Progress Notes (Signed)
Spoke to Dr. Margaretmary Eddy via phone and instructed to proceed w/ d/c as ordered. Wenda Low [facility]

## 2016-10-16 NOTE — Care Management (Signed)
Notified [facility] of discharge back to assisted living.  SN and PT home health to resume.

## 2016-10-16 NOTE — Anesthesia Postprocedure Evaluation (Signed)
Anesthesia Post Note  Patient: Michele Schultz  Procedure(s) Performed: Procedure(s) (LRB): ESOPHAGOGASTRODUODENOSCOPY (EGD) WITH PROPOFOL (N/A)  Patient location during evaluation: PACU Anesthesia Type: General Level of consciousness: awake and alert and oriented Pain management: pain level controlled Vital Signs Assessment: post-procedure vital signs reviewed and stable Respiratory status: spontaneous breathing Cardiovascular status: blood pressure returned to baseline Anesthetic complications: no    Last Vitals:  Vitals:   10/16/16 1100 10/16/16 1157  BP:  (!) 153/87  Pulse: (!) 112 (!) 101  Resp:    Temp:  36.8 C    Last Pain:  Vitals:   10/16/16 1410  TempSrc:   PainSc: 8                  Pasqual Farias

## 2016-10-16 NOTE — Care Management (Signed)
Informed by CSW that [facility] is reluctant to accept patient back at facility.  CM spoke with physical therapy and level of functioning care needs can be met at the assisted living level of care with home health physical therapy.

## 2016-10-16 NOTE — Progress Notes (Signed)
Informed by social work that d/c to [facility] facility today denied by said facility. Already informed patient and physician via text page. Requested d/c of today's d/c order to [facility]. Wenda Low [facility]

## 2016-10-17 DIAGNOSIS — R112 Nausea with vomiting, unspecified: Secondary | ICD-10-CM | POA: Diagnosis not present

## 2016-10-17 LAB — CBC
HEMATOCRIT: 36.1 % (ref 35.0–47.0)
HEMOGLOBIN: 11.8 g/dL — AB (ref 12.0–16.0)
MCH: 27.1 pg (ref 26.0–34.0)
MCHC: 32.7 g/dL (ref 32.0–36.0)
MCV: 82.8 fL (ref 80.0–100.0)
Platelets: 299 10*3/uL (ref 150–440)
RBC: 4.36 MIL/uL (ref 3.80–5.20)
RDW: 16.1 % — AB (ref 11.5–14.5)
WBC: 6 10*3/uL (ref 3.6–11.0)

## 2016-10-17 MED ORDER — PROMETHAZINE HCL 25 MG PO TABS
25.0000 mg | ORAL_TABLET | Freq: Four times a day (QID) | ORAL | Status: DC | PRN
  Administered 2016-10-17 – 2016-10-19 (×6): 25 mg via ORAL
  Filled 2016-10-17 (×6): qty 1

## 2016-10-17 NOTE — Progress Notes (Signed)
LCSW had another long conversation with executive director Michele Schultz regarding discharge and plans to return. Michele Schultz continues to report that facility cannot meet her needs and that patient needs higher level of care SNF. LCSW explained SNF process, recommendations from PT (2 different notes stating ALF with [facility]) and observation status. Michele Schultz reports she wants patient placed at [facility] and will call [facility] to place her. Reports she will also call the niece POA and see if they can private pay. LCSW explained she has no intensity at this time for SNF documented in this admission and no payer source due to observation status in hospital and patient not able to qualify for medicaid per her POA.  [facility]: Michele Schultz called LCSW to obtain referral and LCSW explained situation. Michele Schultz will follow up with ALF and work with family.  At this time, disposition remains to be pending as it is unknown where patient will go at discharge. POA and patient want to return to ALF. ALF at this time not accepting patient back. Patient does not meet criteria for skilled level of care.  Will continue to follow.  Michele Schultz, MSW Clinical Social Work: Printmaker Coverage for :  [phone]

## 2016-10-17 NOTE — Progress Notes (Signed)
Patient is awaiting placement in SNF , c/o nausea and was medicated x 2 as order , no vomiting noted at this time , resting in bed , will continue to monitor .

## 2016-10-17 NOTE — Care Management (Signed)
CM was informed 11.13 that patient was open to [facility].  spoke with [facility] and informed patient open to agency and followed at [place].  Left message with [facility] to discuss. CSW continues to work on return to [employer]

## 2016-10-17 NOTE — Progress Notes (Signed)
Nutrition Follow-up  DOCUMENTATION CODES:   Morbid obesity  INTERVENTION:  1. Ordered Breakfast for patient for tomorrow, otherwise patient declined interventions, still does not feel well, not eating much   NUTRITION DIAGNOSIS:   Inadequate oral intake related to inability to eat as evidenced by NPO status, per patient/family report. -ongoing  GOAL:   Patient will meet greater than or equal to 90% of their needs -not meeting  MONITOR:   I & O's, Diet advancement, Labs, Weight trends, Supplement acceptance  REASON FOR ASSESSMENT:   Malnutrition Screening Tool    ASSESSMENT:   Michele Schultz  is a 82 y.o. female who presents with Nausea and weakness, on arrival to the ED she was in A. fib with RVR.   MS. Gerke continues to have poor PO intake. She was picking at her lunch during my visit. States she still feels poorly, wants to go home. 50% meal completion for lunch and dinner thus far.  Avg meal completion 58% thus far. Labs and medications reviewed: Reglan  Diet Order:  Diet Heart Room service appropriate? Yes with Assist; Fluid consistency: Thin  Skin:  Reviewed, no issues  Last BM:  PTA  Height:   Ht Readings from Last 1 Encounters:  10/12/16 5\' 4"  (1.626 m)    Weight:   Wt Readings from Last 1 Encounters:  10/17/16 254 lb 9.6 oz (115.5 kg)    Ideal Body Weight:  54.54 kg  BMI:  Body mass index is 43.7 kg/m.  Estimated Nutritional Needs:   Kcal:  1700-2100 (25-30 cal/kg ABW)  Protein:  116 gm  Fluid:  >/= 1.7L  EDUCATION NEEDS:   No education needs identified at this time  Satira Anis. Zacheriah Stumpe, MS, RD LDN Inpatient Clinical Dietitian Pager [phone]

## 2016-10-17 NOTE — Progress Notes (Signed)
[facility] [place] NAME: Michele Schultz    MR#:  [id number]  DATE OF BIRTH:  02-28-1931  SUBJECTIVE:  CHIEF COMPLAINT:  Pt is tolerating diet, no vomiting witnessed by any one  but claims she vomits, vomit bag is always empty, reports nausea to everyone  REVIEW OF SYSTEMS:  CONSTITUTIONAL: No fever, fatigue or weakness.  EYES: No blurred or double vision.  EARS, NOSE, AND THROAT: No tinnitus or ear pain.  RESPIRATORY: No cough, shortness of breath, wheezing or hemoptysis.  CARDIOVASCULAR: No chest pain, orthopnea, edema.  GASTROINTESTINAL: No nausea, vomiting, diarrhea or abdominal pain.  GENITOURINARY: No dysuria, hematuria.  ENDOCRINE: No polyuria, nocturia,  HEMATOLOGY: No anemia, easy bruising or bleeding SKIN: No rash or lesion. MUSCULOSKELETAL: No joint pain or arthritis.   NEUROLOGIC: No tingling, numbness, weakness.  PSYCHIATRY: No anxiety or depression.   DRUG ALLERGIES:   Allergies  Allergen Reactions  . Citalopram Other (See Comments)    Reaction:  Altered mental status   . Cymbalta [Duloxetine Hcl] Other (See Comments)    Reaction:  Sedative for pt   . Imipramine Other (See Comments)    Reaction:  Unknown   . Proton Pump Inhibitors Other (See Comments)    Reaction:  Unknown   . Venlafaxine Nausea And Vomiting and Other (See Comments)    Reaction:  Dizziness     VITALS:  Blood pressure (!) 146/59, pulse 100, temperature 98.1 F (36.7 C), temperature source Oral, resp. rate 20, height 5\' 4"  (1.626 m), weight 115.5 kg (254 lb 9.6 oz), SpO2 100 %.  PHYSICAL EXAMINATION:  GENERAL:  82 y.o.-year-old patient lying in the bed with no acute distress.  EYES: Pupils equal, round, reactive to light and accommodation. No scleral icterus. Extraocular muscles intact.  HEENT: Head atraumatic, normocephalic. Oropharynx and nasopharynx clear.  NECK:  Supple, no jugular venous distention. No thyroid enlargement, no  tenderness.  LUNGS: Normal breath sounds bilaterally, no wheezing, rales,rhonchi or crepitation. No use of accessory muscles of respiration.  CARDIOVASCULAR: S1, S2 normal. No murmurs, rubs, or gallops.  ABDOMEN: Soft, nontender, nondistended. Bowel sounds present. No organomegaly or mass.  EXTREMITIES: No pedal edema, cyanosis, or clubbing.  NEUROLOGIC: Cranial nerves II through XII are intact. Muscle strength 5/5 in all extremities. Sensation intact. Gait not checked.  PSYCHIATRIC: The patient is alert and oriented x 3.  SKIN: No obvious rash, lesion, or ulcer.    LABORATORY PANEL:   CBC  Recent Labs Lab 10/17/16 0402  WBC 6.0  HGB 11.8*  HCT 36.1  PLT 299   ------------------------------------------------------------------------------------------------------------------  Chemistries   Recent Labs Lab 10/11/16 2145  10/16/16 0523  NA 139  < > 138  K 2.3*  < > 4.0  CL 96*  < > 107  CO2 33*  < > 23  GLUCOSE 155*  < > 140*  BUN 10  < > 5*  CREATININE 0.71  < > 0.62  CALCIUM 9.3  < > 8.8*  MG  --   < > 2.0  AST 46*  --   --   ALT 21  --   --   ALKPHOS 79  --   --   BILITOT 0.6  --   --   < > = values in this interval not displayed. ------------------------------------------------------------------------------------------------------------------  Cardiac Enzymes  Recent Labs Lab 10/11/16 2145  TROPONINI <0.03   ------------------------------------------------------------------------------------------------------------------  RADIOLOGY:  No results found.  EKG:   Orders placed or  performed during the hospital encounter of 10/11/16  . EKG 12-Lead  . EKG 12-Lead  . EKG 12-Lead  . EKG 12-Lead    ASSESSMENT AND PLAN:   #1 Recurrent nausea with epigastric pain- resolved and tolerating regular diet -Zofran every 6 hoursAs needed,Reglan 10 mg 3 times a day per GI were given during the hospital course .Will discharge patient home with Reglan 5 mg as needed  as recommended by GI for persistent nausea, Phenergan and Zofran which are her home medications -Stool for H. pylori test done results are pending and to be followed up by primary care physician - EGD Showed hiatal hernia. No significant findings. - Had prior admissions for the same. Continues to have nausea along with on and off right upper quadrant pain. Negative HIDA scan.    #2 Afib with rvr- known h/o Afib, s/p pacemaker - rate controlled now, triggered by hypokalemia likely,we will correct hypokalemia Due to atrail tachycardia history- last adm, started on cardizem- continue- increase dose - correct electrolytes - outpatient cardiology f/u  #3 Hypokalemia- repleteD, POTASSIUM AT 4  #4 Diastolic CHF- stable, well compensated. on low-dose Lasix  #5 hypertension-on Cardizem by mouth.  #6 DVT prophylaxis- Lovenox subcutaneous  PT has recommended home health PT at assisted living facility  Pt not d/ced  As facility is refusing to take her back, will f/u with CM     All the records are reviewed and case discussed with Care Management/Social Workerr. Management plans discussed with the patient, family and they are in agreement.  CODE STATUS: DNR  TOTAL TIME TAKING CARE OF THIS PATIENT: 32 minutes.   POSSIBLE D/C IN 1 DAYS, DEPENDING ON CLINICAL CONDITION.  Note: This dictation was prepared with Dragon dictation along with smaller phrase technology. Any transcriptional errors that result from this process are unintentional.   Nicholes Mango M.D on 10/17/2016 at 6:45 PM  Between 7am to 6pm - Pager - [phone] After 6pm go to [email or web address] - password [email or web address] [facility]  Office  [phone]  CC: Primary care physician; [name], MD

## 2016-10-17 NOTE — Care Management (Signed)
CSW is still communicating with [facility] regarding patient returning.

## 2016-10-17 NOTE — Progress Notes (Addendum)
Late Entry:  LCSW received call from facility around 5pm on Monday evening reporting that they cannot take patient back due to legality of the SNF already completing the [facility] stating she needs SNF.  LCSW explained to [facility], director that this was inaccurate information as hospitalization and recommendations from PT override FL2 at facility as there has been a change in condition. Call placed to medical director and director of SW to discuss legal ramifications of the state for patient to be denied. They confirm with LCSW that this is not the case and patient should return to ALF. ALF should have given a 30 day notice and developed a safe discharge plan for patient per legal.  Call to be placed to ALF this morning in effort for patient to return. Will follow up with disposition. If ALF refuses, LCSW to make case with Solmon Ice (state DHHS regulator) as this is an inappropriate by the facility.  11:12 AM Call placed to patient's POA: Shelia to update regarding disposition and barriers for return. POA reports this is brand new and she has not heard any problems or that she does not meet level of care. LCSW will continue to keep family informed.  Updated her regarding of discharge and remaining in observation status.  Still no return call back from facility.   Lane Hacker, MSW Clinical Social Work: Printmaker Coverage for :  [phone]

## 2016-10-18 DIAGNOSIS — R112 Nausea with vomiting, unspecified: Secondary | ICD-10-CM | POA: Diagnosis not present

## 2016-10-18 LAB — H. PYLORI ANTIGEN, STOOL: H. Pylori Stool Ag, Eia: NEGATIVE

## 2016-10-18 NOTE — Clinical Social Work Note (Addendum)
MSW attempted to contact [facility] SNF who was going to make contact with patient's family and ALF in regards to possibly accepting her.  MSW left a message awaiting for call back from [facility].  12:45pm  MSW received phone call from [facility], who said they will assess patient today around 3pm to see if patient is eligible to go to SNF.  MSW will be updated from [facility], after they complete assessment.  3:30pm  MSW spoke to Gerrard at [facility] who does not see a skillable need currently on why patient needs to go to SNF as a long term care resident.  [facility] was waiting to talk with [facility] to determine what the reason is they feel she needs SNF placement.  MSW spoke to [facility] who stated they wanted MSW to speak with the regional director Ardith Dark, [phone], to discuss the reason patient can not return to ALF.  MSW called and left a message awaiting call back from ALF.  PT continues to recommend home health, patient expresses she does not want to do anything for herself.  MSW to continue to follow patient's progress.  Jones Broom. Norval Morton, MSW [phone]  Mon-Fri 8a-4:30p 10/18/2016 12:24 PM

## 2016-10-18 NOTE — Progress Notes (Signed)
[facility] at [place] NAME: Michele Schultz    MR#:  [id number]  DATE OF BIRTH:  1931/02/15  SUBJECTIVE:  CHIEF COMPLAINT:  Pt is tolerating diet, No overnight events no vomiting reports nausea to everyone  REVIEW OF SYSTEMS:  CONSTITUTIONAL: No fever, fatigue or weakness.  EYES: No blurred or double vision.  EARS, NOSE, AND THROAT: No tinnitus or ear pain.  RESPIRATORY: No cough, shortness of breath, wheezing or hemoptysis.  CARDIOVASCULAR: No chest pain, orthopnea, edema.  GASTROINTESTINAL: No nausea, vomiting, diarrhea or abdominal pain.  GENITOURINARY: No dysuria, hematuria.  ENDOCRINE: No polyuria, nocturia,  HEMATOLOGY: No anemia, easy bruising or bleeding SKIN: No rash or lesion. MUSCULOSKELETAL: No joint pain or arthritis.   NEUROLOGIC: No tingling, numbness, weakness.  PSYCHIATRY: No anxiety or depression.   DRUG ALLERGIES:   Allergies  Allergen Reactions  . Citalopram Other (See Comments)    Reaction:  Altered mental status   . Cymbalta [Duloxetine Hcl] Other (See Comments)    Reaction:  Sedative for pt   . Imipramine Other (See Comments)    Reaction:  Unknown   . Proton Pump Inhibitors Other (See Comments)    Reaction:  Unknown   . Venlafaxine Nausea And Vomiting and Other (See Comments)    Reaction:  Dizziness     VITALS:  Blood pressure (!) 144/60, pulse (!) 110, temperature 98.7 F (37.1 C), temperature source Oral, resp. rate 18, height 5\' 4"  (1.626 m), weight 114.5 kg (252 lb 6.4 oz), SpO2 99 %.  PHYSICAL EXAMINATION:  GENERAL:  82 y.o.-year-old patient lying in the bed with no acute distress.  EYES: Pupils equal, round, reactive to light and accommodation. No scleral icterus. Extraocular muscles intact.  HEENT: Head atraumatic, normocephalic. Oropharynx and nasopharynx clear.  NECK:  Supple, no jugular venous distention. No thyroid enlargement, no tenderness.  LUNGS: Normal breath sounds bilaterally, no  wheezing, rales,rhonchi or crepitation. No use of accessory muscles of respiration.  CARDIOVASCULAR: S1, S2 normal. No murmurs, rubs, or gallops.  ABDOMEN: Soft, nontender, nondistended. Bowel sounds present. No organomegaly or mass.  EXTREMITIES: No pedal edema, cyanosis, or clubbing.  NEUROLOGIC: Cranial nerves II through XII are intact. Muscle strength 5/5 in all extremities. Sensation intact. Gait not checked.  PSYCHIATRIC: The patient is alert and oriented x 3.  SKIN: No obvious rash, lesion, or ulcer.    LABORATORY PANEL:   CBC  Recent Labs Lab 10/17/16 0402  WBC 6.0  HGB 11.8*  HCT 36.1  PLT 299   ------------------------------------------------------------------------------------------------------------------  Chemistries   Recent Labs Lab 10/11/16 2145  10/16/16 0523  NA 139  < > 138  K 2.3*  < > 4.0  CL 96*  < > 107  CO2 33*  < > 23  GLUCOSE 155*  < > 140*  BUN 10  < > 5*  CREATININE 0.71  < > 0.62  CALCIUM 9.3  < > 8.8*  MG  --   < > 2.0  AST 46*  --   --   ALT 21  --   --   ALKPHOS 79  --   --   BILITOT 0.6  --   --   < > = values in this interval not displayed. ------------------------------------------------------------------------------------------------------------------  Cardiac Enzymes  Recent Labs Lab 10/11/16 2145  TROPONINI <0.03   ------------------------------------------------------------------------------------------------------------------  RADIOLOGY:  No results found.  EKG:   Orders placed or performed during the hospital encounter of 10/11/16  . EKG  12-Lead  . EKG 12-Lead  . EKG 12-Lead  . EKG 12-Lead    ASSESSMENT AND PLAN:   #1 Recurrent nausea with epigastric pain- resolved and tolerating regular diet -Zofran every 6 hoursAs needed,Reglan 10 mg 3 times a day per GI were given during the hospital course .Will discharge patient home with Reglan 5 mg as needed as recommended by GI for persistent nausea, Phenergan and  Zofran which are her home medications -Stool for H. pylori test done results are pending and to be followed up by primary care physician - EGD Showed hiatal hernia. No significant findings. - Had prior admissions for the same. Continues to have nausea along with on and off right upper quadrant pain. Negative HIDA scan.    #2 Afib with rvr- known h/o Afib, s/p pacemaker - rate controlled now, triggered by hypokalemia likely,we will correct hypokalemia Due to atrail tachycardia history- last adm, started on cardizem- continue- increase dose - correct electrolytes - outpatient cardiology f/u  #3 Hypokalemia- repleteD, POTASSIUM AT 4  #4 Diastolic CHF- stable, well compensated. on low-dose Lasix  #5 hypertension-on Cardizem by mouth.  #6 DVT prophylaxis- Lovenox subcutaneous  PT has recommended home health PT at assisted living facility  Pt not d/ced  As facility is refusing to take her back, will f/u with CM     All the records are reviewed and case discussed with Care Management/Social Workerr. Management plans discussed with the patient, family and they are in agreement.  CODE STATUS: DNR  TOTAL TIME TAKING CARE OF THIS PATIENT: 32 minutes.   POSSIBLE D/C IN 1 DAYS, DEPENDING ON CLINICAL CONDITION.  Note: This dictation was prepared with Dragon dictation along with smaller phrase technology. Any transcriptional errors that result from this process are unintentional.   Nicholes Mango M.D on 10/18/2016 at 3:26 PM  Between 7am to 6pm - Pager - [phone] After 6pm go to [email or web address] - password [email or web address] [facility]  Office  [phone]  CC: Primary care physician; [name], MD

## 2016-10-18 NOTE — Progress Notes (Signed)
Physical Therapy Treatment Patient Details Name: Michele Schultz MRN: [id number] DOB: 1931-01-16 Today's Date: 10/18/2016    History of Present Illness Michele Schultz  is a 82 y.o. female who presents with Nausea and weakness, on arrival to the ED she was in A. fib with RVR. She responded well to medications in the ED to control her heart rate, but had persistent nausea weakness, and clinical dehydration. Pt admitted under observation status for recurrent nausea with epigastric pain, afib with RVR, and hypokalemia. Pt denies falls in the last 12 months    PT Comments    Pt reporting feeling nauseas but agreeable to getting OOB.  Transfer set-up similar to last session but pt reporting that therapist wasn't doing it right (although therapist only providing CGA for safety initially and positioning self same as prior session) and pt requiring min assist at very end of transfer to finish transfer and get B hips into chair.  Pt then reporting that therapist did not know what she was doing and would not move for therapist (to re-adjust in chair) and would only move for NT.  Nursing tech called and assisted pt into more comfortable position in chair (scooted pt back in chair).  Anticipate that pt's assist levels for bed mobility and transfers can be met at ALF level with HHPT.  Will continue to progress pt per pt tolerance.   Follow Up Recommendations  Other (comment);Home health PT (Return to [place])     Equipment Recommendations   (pt reports having RW and w/c at facility)    Recommendations for Other Services       Precautions / Restrictions Precautions Precautions: Fall Restrictions Weight Bearing Restrictions: No    Mobility  Bed Mobility Overal bed mobility: Needs Assistance Bed Mobility: Supine to Sit     Supine to sit: Max assist;HOB elevated     General bed mobility comments: use of bed rail; assist for trunk and B LE's; vc's for  technique/sequencing  Transfers Overall transfer level: Needs assistance Equipment used: None Transfers: Sit to/from [facility] Sit to Stand: Min guard Stand pivot transfers: Min assist       General transfer comment: pt stood (3/4th stand) with L UE support on bed rail, then reached with B UE's for chair, then perform stand step turn with B UE support bed to chair; min assist end of transfer to finish end of transfer (to get B hips into chair);  Ambulation/Gait             General Gait Details: pt declined to try any more activity with therapist   Stairs            Wheelchair Mobility    Modified Rankin (Stroke Patients Only)       Balance Overall balance assessment: Needs assistance Sitting-balance support: Bilateral upper extremity supported;Feet supported Sitting balance-Leahy Scale: Good Sitting balance - Comments: static sitting   Standing balance support: Bilateral upper extremity supported;During functional activity Standing balance-Leahy Scale: Fair Standing balance comment: stand step turn bed to chair                    Cognition Arousal/Alertness: Awake/alert Behavior During Therapy:  (appearing not happy with therapist) Overall Cognitive Status: No family/caregiver present to determine baseline cognitive functioning                      Exercises      General Comments General comments (skin integrity, edema,  etc.): Pt laying in bed upon PT arrival.  Nursing cleared pt for participation in physical therapy.  Pt agreeable to PT session.      Pertinent Vitals/Pain Pain Assessment: No/denies pain  See flow sheet for vitals.    Home Living                      Prior Function            PT Goals (current goals can now be found in the care plan section) Acute Rehab PT Goals Patient Stated Goal: Return to prior function PT Goal Formulation: With patient Time For Goal Achievement:  10/26/16 Potential to Achieve Goals: Fair Additional Goals Additional Goal #1: Pt will be able to perform bed mobility/transfers with cga in order to improve functional independence Progress towards PT goals: Progressing toward goals    Frequency    Min 2X/week      PT Plan Current plan remains appropriate    Co-evaluation             End of Session Equipment Utilized During Treatment: Gait belt Activity Tolerance: Patient tolerated treatment well Patient left: in chair;with call bell/phone within reach;with chair alarm set     Time: 1834-3735 PT Time Calculation (min) (ACUTE ONLY): 16 min  Charges:  $Therapeutic Activity: 8-22 mins                    G CodesLeitha Bleak 20-Oct-2016, 12:24 PM Leitha Bleak, [place]

## 2016-10-19 DIAGNOSIS — R112 Nausea with vomiting, unspecified: Secondary | ICD-10-CM | POA: Diagnosis not present

## 2016-10-19 MED ORDER — FAMOTIDINE 20 MG PO TABS: 20.0000 mg | ORAL_TABLET | Freq: Two times a day (BID) | ORAL | 1 refills | Status: DC

## 2016-10-19 MED ORDER — FAMOTIDINE 20 MG PO TABS
20.0000 mg | ORAL_TABLET | Freq: Two times a day (BID) | ORAL | Status: DC
  Administered 2016-10-19 – 2016-10-21 (×5): 20 mg via ORAL
  Filled 2016-10-19 (×6): qty 1

## 2016-10-19 MED ORDER — PROMETHAZINE HCL 25 MG PO TABS
25.0000 mg | ORAL_TABLET | Freq: Four times a day (QID) | ORAL | 0 refills | Status: DC | PRN
Start: 2016-10-19 — End: 2016-11-01

## 2016-10-19 NOTE — Plan of Care (Signed)
Problem: Health Behavior/Discharge Planning: Goal: Ability to manage health-related needs will improve Outcome: Not Progressing Continues to report pain and nausea.  No vomiting noted.  SW/Care Management continues to explore discharge options.

## 2016-10-19 NOTE — Clinical Social Work Note (Addendum)
MSW attempted to speak with [facility] Director, MSW left message awaiting for call back.  MSW notified Surveyor, quantity of [place] Work about [employer] refusing to accept patient back.  Surveyor, quantity of Social Work attempted to Merchant navy officer regarding patient.  Patietn does not have any skillable needs to go to SNF per PT.  MSW continuing to work on placement for patient.  3:15pm MSW contacted patient's niece to update her that clinical social work Librarian, academic is working on trying to make contact with [employer] as well.  [name] is requesting to continue to be updated on what progress is being made.  Jones Broom. Tyesha Joffe, MSW [phone]  Mon-Fri 8a-4:30p 10/19/2016 2:07 PM

## 2016-10-20 DIAGNOSIS — R112 Nausea with vomiting, unspecified: Secondary | ICD-10-CM | POA: Diagnosis not present

## 2016-10-20 NOTE — Clinical Social Work Note (Addendum)
MSW continuing to work on trying to find placement for patient.  MSW supervisor is involved in assisting with placement.  5:30pm  MSW received a message from bedside nurse that [place] worker Wynelle Beckmann [phone] has spoken to [facility] and they will accept patient back on Saturday.  MSW to notify weekend social worker, MSW updated Surveyor, quantity of social work.  Jones Broom. Norval Morton, MSW [phone]  Mon-Fri 8a-4:30p 10/20/2016 5:43 PM

## 2016-10-20 NOTE — Plan of Care (Signed)
Problem: Health Behavior/Discharge Planning: Goal: Ability to manage health-related needs will improve Outcome: Not Progressing Patient still awaiting placement; vitals signs stable. PT in to work with patient today. Patient requiring two person assist for in bed mobility. Will continue to monitor.

## 2016-10-20 NOTE — Care Management (Signed)
Patient said she does not feel great but is progressing and feeling better than she first did when she arrived into our care. Patient spoke about her Claretha Cooper and clergy friends who were trying to contact her and check on her health. She seems like a sweet lady and is holding firm to her faith while considering her current health status.

## 2016-10-20 NOTE — Progress Notes (Signed)
Physical Therapy Treatment Patient Details Name: Michele Schultz MRN: [id number] DOB: 07/12/1931 Today's Date: 10/20/2016    History of Present Illness Michele Schultz  is a 82 y.o. female who presents with Nausea and weakness, on arrival to the ED she was in A. fib with RVR. She responded well to medications in the ED to control her heart rate, but had persistent nausea weakness, and clinical dehydration. Pt admitted under observation status for recurrent nausea with epigastric pain, afib with RVR, and hypokalemia. Pt denies falls in the last 12 months    PT Comments    Pt refusing any OOB mobility d/t not feeling well in general today and reporting just recently returning to bed (although pt did not elaborate on what OOB mobility she had just done when asked).  Pt agreeable to in bed ex's but requiring pacing and rest breaks d/t self reported fatigue.  Pt attempted to scoot self up in bed with assist of therapist but then pt refused to attempt further d/t fatigue and required 2 assist for boost up in bed.  Will continue to progress pt with functional mobility per pt tolerance.   Follow Up Recommendations  Other (comment);Home health PT (Return to ALF)     Equipment Recommendations   (pt reports having RW and w/c at facility)    Recommendations for Other Services       Precautions / Restrictions Precautions Precautions: Fall Restrictions Weight Bearing Restrictions: No    Mobility  Bed Mobility               General bed mobility comments: Pt refused any OOB d/t just being OOB with the nursing students; attempted bridging in bed to scoot up in bed (with UE's on B siderails) and feet supported (knees bent up in bed) and pt able to scoot up a couple inches but pt refused to attempt it further d/t being tired (2nd assist then obtained to boost pt up in bed)  Transfers                    Ambulation/Gait                 Stairs            Wheelchair  Mobility    Modified Rankin (Stroke Patients Only)       Balance                                    Cognition Arousal/Alertness: Awake/alert Behavior During Therapy: WFL for tasks assessed/performed Overall Cognitive Status: No family/caregiver present to determine baseline cognitive functioning                      Exercises General Exercises - Lower Extremity Ankle Circles/Pumps: AROM;Strengthening;Both;Supine Quad Sets: AROM;Strengthening;Both;Supine Gluteal Sets: AROM;Strengthening;Both;Supine Short Arc Quad: AROM;Strengthening;Both;Supine Heel Slides: AAROM;AROM;Strengthening;Both;Supine Hip ABduction/ADduction: AAROM;AROM;Strengthening;Both;Supine  10 reps (x2 sets) for 20 total repetitions each exercise with vc's and tactile cues for technique    General Comments  Pt agreeable to PT session but not OOB activity.      Pertinent Vitals/Pain Pain Assessment: 0-10 Pain Score: 4  Pain Location: chronic back pain Pain Descriptors / Indicators: Sore Pain Intervention(s): Limited activity within patient's tolerance;Monitored during session;Repositioned  Vitals (HR and O2) stable and WFL throughout treatment session.    Home Living  Prior Function            PT Goals (current goals can now be found in the care plan section) Acute Rehab PT Goals Patient Stated Goal: Return to prior function PT Goal Formulation: With patient Time For Goal Achievement: 10/26/16 Potential to Achieve Goals: Fair Additional Goals Additional Goal #1: Pt will be able to perform bed mobility/transfers with cga in order to improve functional independence Progress towards PT goals: Progressing toward goals (with LE strengthening)    Frequency    Min 2X/week      PT Plan Current plan remains appropriate    Co-evaluation             End of Session   Activity Tolerance: Patient limited by fatigue;Other (comment) (Pt reporting  not feeling well in general) Patient left: in bed;with call bell/phone within reach;with bed alarm set;with nursing/sitter in room     Time: 1127-1150 PT Time Calculation (min) (ACUTE ONLY): 23 min  Charges:  $Therapeutic Exercise: 8-22 mins $Therapeutic Activity: 8-22 mins                    G Codes:      Jceon Alverio 11-13-16, 12:00 PM Leitha Bleak, [place]

## 2016-10-20 NOTE — Progress Notes (Signed)
Patient made aware that she is being transferred to 1C, room 131- Patient made aware that she no longer requires telemetry monitoring. Patient verbalized understanding and did not voice any concerns or hesitations. Patient to be moved after shift change, Patient also stated that she will plan to get out of bed tomorrow morning and thanked the staff today for their assistance with her care. Report previously given to Deidra, RN on 1C- care transferred.

## 2016-10-20 NOTE — Progress Notes (Signed)
Spoke with Wynelle Beckmann, Adult Care Supervisor for [facility]. She states that [facility] will take patient back tomorrow. Call back number [phone]. CSW, Randall Hiss made aware.

## 2016-10-20 NOTE — Progress Notes (Signed)
Patient has been verbalizing desire to get out of bed this evening, vocalizing conversations with leadership that occurred yesterday regarding ambulation and OOB to chair. Patient refused to get out of bed to chair today with Physical Therapy- stating that  "They did not come at the right time". Patient calling nurse for assistance to chair, but upon arrival to room, not wanting to assist with mobility or to attempt to get out of bed. Patient spoke with leadership via phone who encouraged her to get out of bed and ambulate when adequate support can be provided. Patient made aware that another attempt to get patient out of bed to chair will be made in the morning.

## 2016-10-21 DIAGNOSIS — R112 Nausea with vomiting, unspecified: Secondary | ICD-10-CM | POA: Diagnosis not present

## 2016-10-21 LAB — CREATININE, SERUM
Creatinine, Ser: 0.55 mg/dL (ref 0.44–1.00)
GFR calc non Af Amer: >60 mL/min (ref >60)

## 2016-10-21 NOTE — Care Management Note (Signed)
Case Management Note  Patient Details  Name: Michele Schultz MRN: [id number] Date of Birth: 29-Aug-1931  Subjective/Objective:     Resumed home health PT and RN  With [facility] at [facility]. Cheryl at [employer] updated.               Action/Plan:   Expected Discharge Date:                  Expected Discharge Plan:     In-House Referral:     Discharge planning Services     Post Acute Care Choice:    Choice offered to:     DME Arranged:    DME Agency:     HH Arranged:    HH Agency:     Status of Service:     If discussed at [employer] of Stay Meetings, dates discussed:    Additional Comments:  Kazimierz Springborn A, RN 10/21/2016, 10:21 AM

## 2016-10-21 NOTE — Clinical Social Work Note (Signed)
Patient to dc to [facility] via non-emergent EMS. The patient's niece, Adela Lank, and the facility are aware. CSW will con't to follow pending any additional dc needs.  Santiago Bumpers, MSW, LCSW-A [phone]

## 2016-10-21 NOTE — Care Management (Signed)
CSW was notified late 11.17 pm that [facility] is gong to accpet the patient back 11.18

## 2016-10-21 NOTE — Progress Notes (Signed)
MD making rounds. Received order to discharge to [place] via EMS. IV removed. CSW facilitating discharge to facility. Report called to Marliss Coots, Nurse at [facility]. No unanswered questions. EMS called for transport. EMS transporters on Unit for transport. Report given to transporters. Some belongings sent with EMS. Adela Lank, niece called with updated status and need for patients belongings to be picked up due to EMS unable to take all belongings. Someone to pick up belongings today.

## 2016-10-21 NOTE — Progress Notes (Signed)
Note for 10/20/16 - LATE ENTRY  Patient ID: Michele Schultz, female   DOB: [birth year], 82 y.o.   MRN: [id number]  Patient ID: Michele Schultz, female   DOB: Dec 20, 1930, 82 y.o.   MRN: [id number] [name]    MR#:  [id number]  DATE OF BIRTH:  October 26, 1931  SUBJECTIVE:  CHIEF COMPLAINT:  Pt is tolerating diet, feels well. No overnight events.   REVIEW OF SYSTEMS:  CONSTITUTIONAL: No fever, fatigue or weakness.  EYES: No blurred or double vision.  EARS, NOSE, AND THROAT: No tinnitus or ear pain.  RESPIRATORY: No cough, shortness of breath, wheezing or hemoptysis.  CARDIOVASCULAR: No chest pain, orthopnea, edema.  GASTROINTESTINAL: No nausea, vomiting, diarrhea or abdominal pain.  GENITOURINARY: No dysuria, hematuria.  ENDOCRINE: No polyuria, nocturia,  HEMATOLOGY: No anemia, easy bruising or bleeding SKIN: No rash or lesion. MUSCULOSKELETAL: No joint pain or arthritis.   NEUROLOGIC: No tingling, numbness, weakness.  PSYCHIATRY: No anxiety or depression.   DRUG ALLERGIES:        Allergies  Allergen Reactions  . Citalopram Other (See Comments)    Reaction:  Altered mental status   . Cymbalta [Duloxetine Hcl] Other (See Comments)    Reaction:  Sedative for pt   . Imipramine Other (See Comments)    Reaction:  Unknown   . Proton Pump Inhibitors Other (See Comments)    Reaction:  Unknown   . Venlafaxine Nausea And Vomiting and Other (See Comments)    Reaction:  Dizziness     VITALS:  Stable.  PHYSICAL EXAMINATION:  GENERAL:  82 y.o.-year-old patient lying in the bed with no acute distress.  EYES: Pupils equal, round, reactive to light and accommodation. No scleral icterus. Extraocular muscles intact.  HEENT: Head atraumatic, normocephalic. Oropharynx and nasopharynx clear.  NECK:  Supple, no jugular venous distention. No thyroid enlargement, no tenderness.  LUNGS: Normal  breath sounds bilaterally, no wheezing, rales,rhonchi or crepitation. No use of accessory muscles of respiration.  CARDIOVASCULAR: S1, S2 normal. No murmurs, rubs, or gallops.  ABDOMEN: Soft, nontender, nondistended. Bowel sounds present. No organomegaly or mass.  EXTREMITIES: No pedal edema, cyanosis, or clubbing.  NEUROLOGIC: Cranial nerves II through XII are intact. Muscle strength 5/5 in all extremities. Sensation intact. Gait not checked.  PSYCHIATRIC: The patient is alert and oriented x 3.  SKIN: No obvious rash, lesion, or ulcer.    LABORATORY PANEL:   CBC  Last Labs    Recent Labs Lab 10/17/16 0402  WBC 6.0  HGB 11.8*  HCT 36.1  PLT 299     ------------------------------------------------------------------------------------------------------------------  Chemistries   Last Labs    Recent Labs Lab 10/11/16 2145  10/16/16 0523  NA 139  < > 138  K 2.3*  < > 4.0  CL 96*  < > 107  CO2 33*  < > 23  GLUCOSE 155*  < > 140*  BUN 10  < > 5*  CREATININE 0.71  < > 0.62  CALCIUM 9.3  < > 8.8*  MG  --   < > 2.0  AST 46*  --   --   ALT 21  --   --   ALKPHOS 79  --   --   BILITOT 0.6  --   --   < > = values in this interval not displayed.   ------------------------------------------------------------------------------------------------------------------  Cardiac Enzymes  Last Labs    Recent Labs Lab 10/11/16 2145  TROPONINI <0.03     ------------------------------------------------------------------------------------------------------------------  RADIOLOGY:  Imaging Results (Last 48 hours)  No results found.    EKG:      Orders placed or performed during the hospital encounter of 10/11/16  . EKG 12-Lead  . EKG 12-Lead  . EKG 12-Lead  . EKG 12-Lead    ASSESSMENT AND PLAN:   #1 Recurrent nausea with epigastric pain- resolved and tolerating regular diet Reglan, Phenergan and Zofran.  Waiting for a bed.    #2 Afib with rvr-  known h/o Afib, s/p pacemaker - rate controlled now - Continue Cardizem  #3 Hypokalemia- repleted  #4 Diastolic CHF- stable,  - Continue Lasix  #5 hypertension-stable Continue Cardizem  #6 DVT prophylaxis- Lovenox subcutaneous  PT has recommended home health PT at assisted living facility  Patient discharged, waiting for bed.   All the records are reviewed and case discussed with Care Management/Social Workerr. Management plans discussed with the patient, family and they are in agreement.  CODE STATUS: DNR  TOTAL TIME TAKING CARE OF THIS PATIENT: 20 minutes.    Note: This dictation was prepared with Dragon dictation along with smaller phrase technology. Any transcriptional errors that result from this process are unintentional.

## 2016-10-21 NOTE — Progress Notes (Signed)
Patient ID: Michele Schultz, female   DOB: 03-Aug-1931, 82 y.o.   MRN: [id number] Patient ID: Michele Schultz, female   DOB: [birth year], 82 y.o.   MRN: [id number] [place] [facility] [place] NAME: Michele Schultz    MR#:  [id number]  DATE OF BIRTH:  Oct 22, 1931  SUBJECTIVE:  CHIEF COMPLAINT:  Pt is tolerating diet, feels well. No overnight events.   REVIEW OF SYSTEMS:  CONSTITUTIONAL: No fever, fatigue or weakness.  EYES: No blurred or double vision.  EARS, NOSE, AND THROAT: No tinnitus or ear pain.  RESPIRATORY: No cough, shortness of breath, wheezing or hemoptysis.  CARDIOVASCULAR: No chest pain, orthopnea, edema.  GASTROINTESTINAL: No nausea, vomiting, diarrhea or abdominal pain.  GENITOURINARY: No dysuria, hematuria.  ENDOCRINE: No polyuria, nocturia,  HEMATOLOGY: No anemia, easy bruising or bleeding SKIN: No rash or lesion. MUSCULOSKELETAL: No joint pain or arthritis.   NEUROLOGIC: No tingling, numbness, weakness.  PSYCHIATRY: No anxiety or depression.   DRUG ALLERGIES:        Allergies  Allergen Reactions  . Citalopram Other (See Comments)    Reaction:  Altered mental status   . Cymbalta [Duloxetine Hcl] Other (See Comments)    Reaction:  Sedative for pt   . Imipramine Other (See Comments)    Reaction:  Unknown   . Proton Pump Inhibitors Other (See Comments)    Reaction:  Unknown   . Venlafaxine Nausea And Vomiting and Other (See Comments)    Reaction:  Dizziness     VITALS:  @VS @  PHYSICAL EXAMINATION:  GENERAL:  82 y.o.-year-old patient lying in the bed with no acute distress.  EYES: Pupils equal, round, reactive to light and accommodation. No scleral icterus. Extraocular muscles intact.  HEENT: Head atraumatic, normocephalic. Oropharynx and nasopharynx clear.  NECK:  Supple, no jugular venous distention. No thyroid enlargement, no tenderness.  LUNGS: Normal breath sounds bilaterally, no wheezing,  rales,rhonchi or crepitation. No use of accessory muscles of respiration.  CARDIOVASCULAR: S1, S2 normal. No murmurs, rubs, or gallops.  ABDOMEN: Soft, nontender, nondistended. Bowel sounds present. No organomegaly or mass.  EXTREMITIES: No pedal edema, cyanosis, or clubbing.  NEUROLOGIC: Cranial nerves II through XII are intact. Muscle strength 5/5 in all extremities. Sensation intact. Gait not checked.  PSYCHIATRIC: The patient is alert and oriented x 3.  SKIN: No obvious rash, lesion, or ulcer.    LABORATORY PANEL:   CBC  Last Labs    Recent Labs Lab 10/17/16 0402  WBC 6.0  HGB 11.8*  HCT 36.1  PLT 299     ------------------------------------------------------------------------------------------------------------------  Chemistries   Last Labs    Recent Labs Lab 10/11/16 2145  10/16/16 0523  NA 139  < > 138  K 2.3*  < > 4.0  CL 96*  < > 107  CO2 33*  < > 23  GLUCOSE 155*  < > 140*  BUN 10  < > 5*  CREATININE 0.71  < > 0.62  CALCIUM 9.3  < > 8.8*  MG  --   < > 2.0  AST 46*  --   --   ALT 21  --   --   ALKPHOS 79  --   --   BILITOT 0.6  --   --   < > = values in this interval not displayed.   ------------------------------------------------------------------------------------------------------------------  Cardiac Enzymes  Last Labs    Recent Labs Lab 10/11/16 2145  TROPONINI <0.03     ------------------------------------------------------------------------------------------------------------------  RADIOLOGY:  Imaging Results (Last 48 hours)  No results found.    EKG:      Orders placed or performed during the hospital encounter of 10/11/16  . EKG 12-Lead  . EKG 12-Lead  . EKG 12-Lead  . EKG 12-Lead    ASSESSMENT AND PLAN:   #1 Recurrent nausea with epigastric pain- resolved and tolerating regular diet Reglan, Phenergan and Zofran.  Waiting for a bed.    #2 Afib with rvr- known h/o Afib, s/p pacemaker - rate  controlled now - Continue Cardizem  #3 Hypokalemia- repleted  #4 Diastolic CHF- stable,  - Continue Lasix  #5 hypertension-stable Continue Cardizem  #6 DVT prophylaxis- Lovenox subcutaneous  PT has recommended home health PT at assisted living facility  Patient discharged, waiting for bed.   All the records are reviewed and case discussed with Care Management/Social Workerr. Management plans discussed with the patient, family and they are in agreement.  CODE STATUS: DNR  TOTAL TIME TAKING CARE OF THIS PATIENT: 20 minutes.    Note: This dictation was prepared with Dragon dictation along with smaller phrase technology. Any transcriptional errors that result from this process are unintentional.

## 2016-11-01 ENCOUNTER — Inpatient Hospital Stay
Admission: EM | Admit: 2016-11-01 | Discharge: 2016-11-06 | DRG: 391 | Disposition: A | Discharge status: Skilled Nursing Facility | Payer: Medicare Other | Attending: Internal Medicine | Admitting: Internal Medicine

## 2016-11-01 ENCOUNTER — Inpatient Hospital Stay: Payer: Medicare Other

## 2016-11-01 ENCOUNTER — Encounter: Payer: Self-pay | Admitting: Emergency Medicine

## 2016-11-01 DIAGNOSIS — R197 Diarrhea, unspecified: Secondary | ICD-10-CM

## 2016-11-01 DIAGNOSIS — I110 Hypertensive heart disease with heart failure: Secondary | ICD-10-CM | POA: Diagnosis present

## 2016-11-01 DIAGNOSIS — F4321 Adjustment disorder with depressed mood: Secondary | ICD-10-CM | POA: Diagnosis present

## 2016-11-01 DIAGNOSIS — Z66 Do not resuscitate: Secondary | ICD-10-CM | POA: Diagnosis present

## 2016-11-01 DIAGNOSIS — Z7982 Long term (current) use of aspirin: Secondary | ICD-10-CM

## 2016-11-01 DIAGNOSIS — K8020 Calculus of gallbladder without cholecystitis without obstruction: Secondary | ICD-10-CM | POA: Diagnosis present

## 2016-11-01 DIAGNOSIS — E872 Acidosis, unspecified: Secondary | ICD-10-CM

## 2016-11-01 DIAGNOSIS — Z950 Presence of cardiac pacemaker: Secondary | ICD-10-CM

## 2016-11-01 DIAGNOSIS — I5032 Chronic diastolic (congestive) heart failure: Secondary | ICD-10-CM | POA: Diagnosis present

## 2016-11-01 DIAGNOSIS — Z951 Presence of aortocoronary bypass graft: Secondary | ICD-10-CM

## 2016-11-01 DIAGNOSIS — E876 Hypokalemia: Secondary | ICD-10-CM | POA: Diagnosis present

## 2016-11-01 DIAGNOSIS — Z6841 Body Mass Index (BMI) 40.0 and over, adult: Secondary | ICD-10-CM | POA: Diagnosis not present

## 2016-11-01 DIAGNOSIS — K219 Gastro-esophageal reflux disease without esophagitis: Secondary | ICD-10-CM | POA: Diagnosis present

## 2016-11-01 DIAGNOSIS — E785 Hyperlipidemia, unspecified: Secondary | ICD-10-CM | POA: Diagnosis present

## 2016-11-01 DIAGNOSIS — R0602 Shortness of breath: Secondary | ICD-10-CM

## 2016-11-01 DIAGNOSIS — A084 Viral intestinal infection, unspecified: Principal | ICD-10-CM | POA: Diagnosis present

## 2016-11-01 DIAGNOSIS — Z96651 Presence of right artificial knee joint: Secondary | ICD-10-CM | POA: Diagnosis present

## 2016-11-01 DIAGNOSIS — I350 Nonrheumatic aortic (valve) stenosis: Secondary | ICD-10-CM | POA: Diagnosis present

## 2016-11-01 DIAGNOSIS — R112 Nausea with vomiting, unspecified: Secondary | ICD-10-CM | POA: Diagnosis present

## 2016-11-01 DIAGNOSIS — I2510 Atherosclerotic heart disease of native coronary artery without angina pectoris: Secondary | ICD-10-CM | POA: Diagnosis present

## 2016-11-01 DIAGNOSIS — E119 Type 2 diabetes mellitus without complications: Secondary | ICD-10-CM | POA: Diagnosis present

## 2016-11-01 DIAGNOSIS — M6281 Muscle weakness (generalized): Secondary | ICD-10-CM

## 2016-11-01 DIAGNOSIS — Z952 Presence of prosthetic heart valve: Secondary | ICD-10-CM

## 2016-11-01 DIAGNOSIS — J189 Pneumonia, unspecified organism: Secondary | ICD-10-CM | POA: Diagnosis present

## 2016-11-01 DIAGNOSIS — G894 Chronic pain syndrome: Secondary | ICD-10-CM | POA: Diagnosis present

## 2016-11-01 DIAGNOSIS — R1084 Generalized abdominal pain: Secondary | ICD-10-CM

## 2016-11-01 DIAGNOSIS — R262 Difficulty in walking, not elsewhere classified: Secondary | ICD-10-CM

## 2016-11-01 DIAGNOSIS — R109 Unspecified abdominal pain: Secondary | ICD-10-CM

## 2016-11-01 DIAGNOSIS — Z79899 Other long term (current) drug therapy: Secondary | ICD-10-CM

## 2016-11-01 DIAGNOSIS — I480 Paroxysmal atrial fibrillation: Secondary | ICD-10-CM | POA: Diagnosis present

## 2016-11-01 DIAGNOSIS — M797 Fibromyalgia: Secondary | ICD-10-CM | POA: Diagnosis present

## 2016-11-01 DIAGNOSIS — Z8249 Family history of ischemic heart disease and other diseases of the circulatory system: Secondary | ICD-10-CM

## 2016-11-01 LAB — COMPREHENSIVE METABOLIC PANEL
ALT: 17 U/L (ref 14–54)
AST: 34 U/L (ref 15–41)
Albumin: 3.5 g/dL (ref 3.5–5.0)
Alkaline Phosphatase: 86 U/L (ref 38–126)
Anion gap: 10 (ref 5–15)
BILIRUBIN TOTAL: 0.5 mg/dL (ref 0.3–1.2)
CO2: 31 mmol/L (ref 22–32)
CREATININE: 0.56 mg/dL (ref 0.44–1.00)
Calcium: 9.1 mg/dL (ref 8.9–10.3)
Chloride: 102 mmol/L (ref 101–111)
Glucose, Bld: 155 mg/dL — ABNORMAL HIGH (ref 65–99)
POTASSIUM: 2.7 mmol/L — AB (ref 3.5–5.1)
Sodium: 143 mmol/L (ref 135–145)
TOTAL PROTEIN: 7.6 g/dL (ref 6.5–8.1)

## 2016-11-01 LAB — MAGNESIUM: MAGNESIUM: 2.1 mg/dL (ref 1.7–2.4)

## 2016-11-01 LAB — CBC
HEMATOCRIT: 36.1 % (ref 35.0–47.0)
Hemoglobin: 11.7 g/dL — ABNORMAL LOW (ref 12.0–16.0)
MCH: 26.7 pg (ref 26.0–34.0)
MCHC: 32.5 g/dL (ref 32.0–36.0)
MCV: 82.3 fL (ref 80.0–100.0)
Platelets: 271 10*3/uL (ref 150–440)
RBC: 4.39 MIL/uL (ref 3.80–5.20)
RDW: 16.9 % — AB (ref 11.5–14.5)
WBC: 4.9 10*3/uL (ref 3.6–11.0)

## 2016-11-01 LAB — C DIFFICILE QUICK SCREEN W PCR REFLEX
C Diff antigen: NEGATIVE
C Diff interpretation: NOT DETECTED
C Diff toxin: NEGATIVE

## 2016-11-01 LAB — LACTIC ACID, PLASMA
LACTIC ACID, VENOUS: 2.7 mmol/L — AB (ref 0.5–1.9)
LACTIC ACID, VENOUS: 2.4 mmol/L — AB (ref 0.5–1.9)

## 2016-11-01 MED ORDER — ONDANSETRON HCL 4 MG/2ML IJ SOLN
4.0000 mg | Freq: Four times a day (QID) | INTRAMUSCULAR | Status: DC | PRN
  Administered 2016-11-01 – 2016-11-06 (×6): 4 mg via INTRAVENOUS
  Filled 2016-11-01 (×8): qty 2

## 2016-11-01 MED ORDER — ASPIRIN EC 81 MG PO TBEC
81.0000 mg | DELAYED_RELEASE_TABLET | Freq: Every day | ORAL | Status: DC
  Administered 2016-11-01 – 2016-11-02 (×2): 81 mg via ORAL
  Filled 2016-11-01 (×2): qty 1

## 2016-11-01 MED ORDER — BISACODYL 5 MG PO TBEC: 5.0000 mg | DELAYED_RELEASE_TABLET | Freq: Every day | ORAL | Status: DC | PRN

## 2016-11-01 MED ORDER — IPRATROPIUM-ALBUTEROL 0.5-2.5 (3) MG/3ML IN SOLN
RESPIRATORY_TRACT | Status: AC
  Administered 2016-11-01: 3 mL via RESPIRATORY_TRACT
  Filled 2016-11-01: qty 3

## 2016-11-01 MED ORDER — SODIUM CHLORIDE 0.9 % IV BOLUS (SEPSIS)
500.0000 mL | Freq: Once | INTRAVENOUS | Status: AC
  Administered 2016-11-01: 500 mL via INTRAVENOUS

## 2016-11-01 MED ORDER — HYDRALAZINE HCL 20 MG/ML IJ SOLN: 5.0000 mg | INTRAMUSCULAR | Status: DC | PRN

## 2016-11-01 MED ORDER — SODIUM CHLORIDE 0.9 % IV SOLN
30.0000 meq | Freq: Once | INTRAVENOUS | Status: AC
  Administered 2016-11-01: 30 meq via INTRAVENOUS
  Filled 2016-11-01: qty 15

## 2016-11-01 MED ORDER — ACETAMINOPHEN 650 MG RE SUPP: 650.0000 mg | Freq: Four times a day (QID) | RECTAL | Status: DC | PRN

## 2016-11-01 MED ORDER — ACETAMINOPHEN 325 MG PO TABS
650.0000 mg | ORAL_TABLET | Freq: Four times a day (QID) | ORAL | Status: DC | PRN
Start: 2016-11-01 — End: 2016-11-06
  Administered 2016-11-01 – 2016-11-05 (×7): 650 mg via ORAL
  Filled 2016-11-01 (×8): qty 2

## 2016-11-01 MED ORDER — RISAQUAD PO CAPS
1.0000 | ORAL_CAPSULE | Freq: Two times a day (BID) | ORAL | Status: DC
  Administered 2016-11-01 – 2016-11-06 (×10): 1 via ORAL
  Filled 2016-11-01 (×11): qty 1

## 2016-11-01 MED ORDER — IPRATROPIUM-ALBUTEROL 0.5-2.5 (3) MG/3ML IN SOLN
3.0000 mL | Freq: Once | RESPIRATORY_TRACT | Status: AC
  Administered 2016-11-01: 3 mL via RESPIRATORY_TRACT

## 2016-11-01 MED ORDER — POTASSIUM CHLORIDE IN NACL 40-0.9 MEQ/L-% IV SOLN
INTRAVENOUS | Status: DC
  Administered 2016-11-01 – 2016-11-02 (×2): 75 mL/h via INTRAVENOUS
  Filled 2016-11-01 (×5): qty 1000

## 2016-11-01 MED ORDER — DILTIAZEM HCL ER COATED BEADS 120 MG PO CP24
120.0000 mg | ORAL_CAPSULE | Freq: Every day | ORAL | Status: DC
  Administered 2016-11-01 – 2016-11-06 (×5): 120 mg via ORAL
  Filled 2016-11-01 (×6): qty 1

## 2016-11-01 MED ORDER — METOCLOPRAMIDE HCL 5 MG/ML IJ SOLN
5.0000 mg | Freq: Four times a day (QID) | INTRAMUSCULAR | Status: DC
  Administered 2016-11-01 – 2016-11-03 (×8): 5 mg via INTRAVENOUS
  Filled 2016-11-01 (×8): qty 2

## 2016-11-01 MED ORDER — ONDANSETRON HCL 4 MG PO TABS
4.0000 mg | ORAL_TABLET | Freq: Four times a day (QID) | ORAL | Status: DC | PRN
  Filled 2016-11-01: qty 1

## 2016-11-01 MED ORDER — SODIUM CHLORIDE 0.9 % IV SOLN: INTRAVENOUS | Status: DC

## 2016-11-01 MED ORDER — HYDROMORPHONE HCL 1 MG/ML IJ SOLN
1.0000 mg | INTRAMUSCULAR | Status: DC | PRN
  Administered 2016-11-01 – 2016-11-03 (×8): 1 mg via INTRAVENOUS
  Filled 2016-11-01 (×8): qty 1

## 2016-11-01 MED ORDER — ENOXAPARIN SODIUM 40 MG/0.4ML ~~LOC~~ SOLN
40.0000 mg | Freq: Two times a day (BID) | SUBCUTANEOUS | Status: DC
  Administered 2016-11-01 – 2016-11-06 (×9): 40 mg via SUBCUTANEOUS
  Filled 2016-11-01 (×10): qty 0.4

## 2016-11-01 MED ORDER — ATORVASTATIN CALCIUM 20 MG PO TABS
80.0000 mg | ORAL_TABLET | Freq: Every day | ORAL | Status: DC
  Administered 2016-11-02 – 2016-11-06 (×4): 80 mg via ORAL
  Filled 2016-11-01 (×5): qty 4

## 2016-11-01 MED ORDER — LORAZEPAM 2 MG/ML IJ SOLN
0.5000 mg | Freq: Once | INTRAMUSCULAR | Status: DC
  Filled 2016-11-01: qty 1

## 2016-11-01 MED ORDER — ENOXAPARIN SODIUM 40 MG/0.4ML ~~LOC~~ SOLN: 40.0000 mg | SUBCUTANEOUS | Status: DC

## 2016-11-01 MED ORDER — SODIUM CHLORIDE 0.9 % IV SOLN
Freq: Once | INTRAVENOUS | Status: DC
  Filled 2016-11-01 (×2): qty 1000

## 2016-11-01 NOTE — ED Notes (Addendum)
Lab called to report a critical potassium of 2.7 and a lactic of 2.7 Dr. Dahlia Client was notified.

## 2016-11-01 NOTE — ED Notes (Signed)
Pt calm, no longer gasping for breath, 98-100% oxygen saturation on 2L nasal cannula.

## 2016-11-01 NOTE — H&P (Signed)
[facility] [place] NAME: Michele Schultz    MR#:  [id number]  DATE OF BIRTH:  1931-02-13  DATE OF ADMISSION:  11/01/2016  PRIMARY CARE PHYSICIAN: [name], MD   REQUESTING/REFERRING PHYSICIAN; Dr. Dahlia Client  CHIEF COMPLAINT: nausea, vomiting for the past 2 days    Chief Complaint  Patient presents with  . Emesis    HISTORY OF PRESENT ILLNESS:  Michele Schultz  is a 82 y.o. female with a known history of Severe aortic stenosis, chronic diastolic heart failure, morbid obesity, proximal atrial fibrillation comes in because of intractable nausea, vomiting, diarrhea for the past 2 days. Patient said the diarrhea decreased but having nausea, generalized abdominal discomfort going on, not able to keep anything down. Patient said that  she is been having some nausea, abdominal discomfort for quite a while which is more often eating fatty foods.  No Fever.  PAST MEDICAL HISTORY:   Past Medical History:  Diagnosis Date  . Anemia   . Aortic stenosis, severe    a. s/p Magna Ease pericardial tissue valve size 21 mm replacement in 10/2011 for severe AS 11/12; b. echo 07/2015: EF 55-60% mod concentric LVH, GR1DD, LA mildly dilated, PASP 45 mm Hg  . Atrial tachycardia, paroxysmal (HCC)    a. with rate related LBBB.  . Cardiac pacemaker -st Judes    11/12  . Chronic diastolic heart failure ([place])    a. echo 2014: EF 55-60%, no RWMA, GR1DD, PASP 47 mm Hg; b. echo 07/2015: EF 55-60% mod concentric LVH, GR1DD, LA mildly dilated, PASP 45 mm Hg  . Complete heart block [facility])    a. s/p St Jude PPM 10/2011 (Ser # [id number]).  . Coronary artery disease    a. s/p 2v CABG 11/12 (VG-LAD, VG-OM1); b. Lexiscan 08/2015: low risk, no ischemia, EF 55-65%.  . Enthesopathy of hip region   . Esophageal reflux    followed by Dr.Seigal. stabilized with a combination of Nexium and Zantac  . Fibromyalgia   . HLD (hyperlipidemia)   . Hypertensive heart  disease   . Knee joint replacement by other means   . Morbid obesity ([place])   . Neuralgia, neuritis, and radiculitis, unspecified   . Neuropathy ([place])   . Onychia and paronychia of toe   . Osteoarthrosis, unspecified whether generalized or localized, pelvic region and thigh    mainly in her back and knees  . PAF (paroxysmal atrial fibrillation) ([place])    a. brief episodes of AF previously noted on device interrogations.  Marland Kitchen RAD (reactive airway disease)    a. chronic SOB  . Spinal stenosis   . Stress incontinence, female    followed by Dr.Cope  . Type II or unspecified type diabetes mellitus without mention of complication, not stated as uncontrolled    a. pt. reports that she is borderline   . Wide-complex tachycardia ([place])    a. Noted 4/10 - brief episode in ED. Noted again 4/21 in clinic->felt most likely to be atrial tach.    PAST SURGICAL HISTOIRY:   Past Surgical History:  Procedure Laterality Date  . AORTIC VALVE REPLACEMENT  10/17/2011   Procedure: AORTIC VALVE REPLACEMENT (AVR);  Surgeon: Melrose Nakayama, MD;  Location: [place];  Service: Open Heart Surgery;  Laterality: N/A;  . CARDIAC CATHETERIZATION  08/2009   50% stenosis distal left main, 50% stenosis ostial left circumflex.   Marland Kitchen CARDIAC CATHETERIZATION     at [facility]  . CORONARY  ARTERY BYPASS GRAFT  10/17/2011   Procedure: CORONARY ARTERY BYPASS GRAFTING (CABG);  Surgeon: Melrose Nakayama, MD;  Location: [place];  Service: Open Heart Surgery;  Laterality: N/A;  Times Two, using right leg greater saphenous vein harvested endoscopically  . ESOPHAGOGASTRODUODENOSCOPY (EGD) WITH PROPOFOL N/A 10/12/2016   Procedure: ESOPHAGOGASTRODUODENOSCOPY (EGD) WITH PROPOFOL;  Surgeon: Lucilla Lame, MD;  Location: [facility];  Service: Endoscopy;  Laterality: N/A;  . EYE SURGERY     IOL/ after cataracts removed- bilateral   . NASAL SINUS SURGERY  2009  . PERMANENT PACEMAKER INSERTION N/A 10/20/2011   Procedure: PERMANENT PACEMAKER  INSERTION;  Surgeon: Thompson Grayer, MD;  Location: [facility] CATH LAB;  Service: Cardiovascular;  Laterality: N/A;  . REPLACEMENT TOTAL KNEE  11/2006   right knee  . TOTAL HIP ARTHROPLASTY  09/2010    SOCIAL HISTORY:   Social History  Substance Use Topics  . Smoking status: Never Smoker  . Smokeless tobacco: Never Used  . Alcohol use No    FAMILY HISTORY:   Family History  Problem Relation Age of Onset  . Heart disease Sister   . Heart disease Brother   . Heart disease Brother   . Heart disease Brother   . Diabetes Other   . Anesthesia problems Neg Hx   . Hypotension Neg Hx   . Malignant hyperthermia Neg Hx   . Pseudochol deficiency Neg Hx     DRUG ALLERGIES:   Allergies  Allergen Reactions  . Citalopram Other (See Comments)    Reaction:  Altered mental status   . Cymbalta [Duloxetine Hcl] Other (See Comments)    Reaction:  Sedative for pt   . Imipramine Other (See Comments)    Reaction:  Unknown   . Proton Pump Inhibitors Other (See Comments)    Reaction:  Unknown   . Venlafaxine Nausea And Vomiting and Other (See Comments)    Reaction:  Dizziness     REVIEW OF SYSTEMS:  CONSTITUTIONAL: No fever, fatigue or weakness.  EYES: No blurred or double vision.  EARS, NOSE, AND THROAT: No tinnitus or ear pain.  RESPIRATORY: No cough, shortness of breath, wheezing or hemoptysis.  CARDIOVASCULAR: No chest pain, orthopnea, edema.  GASTROINTESTINAL: Nausea, vomiting, abdominal pain GENITOURINARY: No dysuria, hematuria.  ENDOCRINE: No polyuria, nocturia,  HEMATOLOGY: No anemia, easy bruising or bleeding SKIN: No rash or lesion. MUSCULOSKELETAL: No joint pain or arthritis.   NEUROLOGIC: No tingling, numbness, weakness.  PSYCHIATRY: No anxiety or depression.   MEDICATIONS AT HOME:   Prior to Admission medications   Medication Sig Start Date End Date Taking? Authorizing Provider  acetaminophen (TYLENOL) 325 MG tablet Take 650 mg by mouth 3 (three) times daily as needed.   Yes  Historical Provider, MD  acidophilus (RISAQUAD) CAPS capsule Take 1 capsule by mouth 2 (two) times daily. 06/26/16  Yes Bettey Costa, MD  aspirin EC 81 MG tablet Take 81 mg by mouth daily.   Yes Historical Provider, MD  atorvastatin (LIPITOR) 80 MG tablet Take 80 mg by mouth daily.   Yes Historical Provider, MD  Calcium Carbonate-Vitamin D 600-400 MG-UNIT tablet Take 1 tablet by mouth daily.   Yes Historical Provider, MD  Cholecalciferol 1000 units capsule Take 1,000 Units by mouth daily.   Yes Historical Provider, MD  diltiazem (CARDIZEM CD) 120 MG 24 hr capsule Take 1 capsule (120 mg total) by mouth daily. 09/21/16  Yes Vaughan Basta, MD  furosemide (LASIX) 20 MG tablet Take 1 tablet (20 mg total) by mouth  every other day. 04/06/16  Yes Rogelia Mire, NP  HYDROcodone-acetaminophen (NORCO) 10-325 MG tablet Take 1 tablet by mouth every 4 (four) hours as needed for moderate pain. Reported on 05/30/2016 06/26/16  Yes Sital Mody, MD  magnesium hydroxide (MILK OF MAGNESIA) 400 MG/5ML suspension Take 10 mLs by mouth daily as needed for mild constipation.    Yes Historical Provider, MD  metoCLOPramide (REGLAN) 5 MG tablet Take 1 tablet (5 mg total) by mouth every 6 (six) hours as needed for nausea or vomiting. 09/21/16  Yes Vaughan Basta, MD  nitroGLYCERIN (NITROSTAT) 0.4 MG SL tablet Place 1 tablet (0.4 mg total) under the tongue every 5 (five) minutes as needed for chest pain. 01/24/13  Yes Minna Merritts, MD  ondansetron (ZOFRAN-ODT) 4 MG disintegrating tablet Take 1 tablet (4 mg total) by mouth every 8 (eight) hours as needed for nausea or vomiting. 10/07/16  Yes Carrie Mew, MD  promethazine (PHENERGAN) 12.5 MG tablet Take 1 tablet (12.5 mg total) by mouth every 6 (six) hours as needed for nausea or vomiting. 10/16/16  Yes Nicholes Mango, MD  traMADol (ULTRAM) 50 MG tablet Take 1 tablet (50 mg total) by mouth every 6 (six) hours as needed. 10/16/16 10/16/17 Yes Nicholes Mango, MD       VITAL SIGNS:  Blood pressure (!) 167/92, pulse 88, temperature 98.3 F (36.8 C), resp. rate 15, height 5\' 4"  (1.626 m), weight 115.2 kg (254 lb), SpO2 95 %.  PHYSICAL EXAMINATION:  GENERAL:  82 y.o.-year-old patient lying in the bed with no acute distress.  EYES: Pupils equal, round, reactive to light and accommodation. No scleral icterus. Extraocular muscles intact.  HEENT: Head atraumatic, normocephalic. Oropharynx and nasopharynx clear.  NECK:  Supple, no jugular venous distention. No thyroid enlargement, no tenderness.  LUNGS: Normal breath sounds bilaterally, no wheezing, rales,rhonchi or crepitation. No use of accessory muscles of respiration.  CARDIOVASCULAR: S1, S2 normal.Ejection systolic murmur present in the aortic area., rubs, or gallops.  ABDOMEN: Midepigastric discomfort present no right upper quadrant tenderness . EXTREMITIES: No pedal edema, cyanosis, or clubbing.  NEUROLOGIC: Cranial nerves II through XII are intact. Muscle strength 5/5 in all extremities. Sensation intact. Gait not checked.  PSYCHIATRIC: The patient is alert and oriented x 3.  SKIN: No obvious rash, lesion, or ulcer.   LABORATORY PANEL:   CBC  Recent Labs Lab 11/01/16 0613  WBC 4.9  HGB 11.7*  HCT 36.1  PLT 271   ------------------------------------------------------------------------------------------------------------------  Chemistries   Recent Labs Lab 11/01/16 0613  NA 143  K 2.7*  CL 102  CO2 31  GLUCOSE 155*  BUN <5*  CREATININE 0.56  CALCIUM 9.1  AST 34  ALT 17  ALKPHOS 86  BILITOT 0.5   ------------------------------------------------------------------------------------------------------------------  Cardiac Enzymes No results for input(s): TROPONINI in the last 168 hours. ------------------------------------------------------------------------------------------------------------------  RADIOLOGY:  Dg Chest 1 View  Result Date: 11/01/2016 CLINICAL DATA:  Nausea  and vomiting beginning this morning. EXAM: CHEST 1 VIEW COMPARISON:  10/11/2016 FINDINGS: Left pacer remains in place, unchanged. Prior CABG. Low lung volumes. No confluent airspace opacities or effusions. No acute bony abnormality. Heart is upper limits normal in size. IMPRESSION: Low lung volumes.  No acute findings. Electronically Signed   By: Rolm Baptise M.D.   On: 11/01/2016 08:30    EKG:   Orders placed or performed during the hospital encounter of 11/01/16  . EKG 12-Lead  . EKG 12-Lead  . ED EKG  . ED EKG    IMPRESSION  AND PLAN:   #1 intractable nausea, vomiting, diarrhea abdominal discomfort likely secondary to viral gastroenteritis: Patient started on IV hydration with potassium supplements, stool for C. difficile has been negative. He continues conservative management. #2 nausea, vomiting, abdominal discomfort more with the fatty foods. CT abdomen last month showed biliary sludge. Obtain ultrasound of the right upper quadrant to evaluate for gallbladder stones. LFTs are normal. #3 severe hypokalemia secondary to vomiting, GI losses: Replacement of a rash and IV hydration, start the patient on clear liquids, monitor on telemetry.  #4 chronic joint pains, ;patient requesting pain medication  #5.  severe aortic stenosis, chronic diastolic heart failure: Patient on Lasix, hold the Lasix at this time because of her nausea, vomiting, dehydration.  #6 proximal atrial fibrillation, history of pacemaker placement: Patient is on Cardizem at home.  7. Malignant hypertension: Likely Secondary to severe nausea patient is so uncomfortable with nausea: Use IV nausea medicine Reglan, Zofran, use IV hydralazine as needed for the blood pressure more than 160/90.  8. DNR;(d/w pt and confirmed).    All the records are reviewed and case discussed with ED provider. Management plans discussed with the patient, family and they are in agreement.  CODE STATUS:   dnr  TOTAL TIME TAKING CARE OF  THIS PATIENT: 2minutes.    Epifanio Lesches M.D on 11/01/2016 at 8:57 AM  Between 7am to 6pm - Pager - [phone]  After 6pm go to [email or web address] - password [email or web address] [facility]  Office  [phone]  CC: Primary care physician; [name], MD  Note: This dictation was prepared with Dragon dictation along with smaller phrase technology. Any transcriptional errors that result from this process are unintentional.

## 2016-11-01 NOTE — ED Triage Notes (Signed)
Pt arrived to the ED via EMS from [facility] for complaints of vomiting for the past 12hrs. According to the staff of [facility], they  have not seen the Pt vomit or have an episode of diarrhea. According to EMS the Pt has been transported to the ED for the same 3 time in the last month and it turns out that the Pt was not sick. Pt is AOx4 in no apparent distress.

## 2016-11-01 NOTE — ED Provider Notes (Signed)
[facility] Emergency Department Provider Note   ____________________________________________   First MD Initiated Contact with Patient 11/01/16 [phone]     (approximate)  I have reviewed the triage vital signs and the nursing notes.   HISTORY  Chief Complaint Emesis    HPI Michele Schultz is a 82 y.o. female who comes into the hospital today with diarrhea, vomiting and cramping. The patient reports that the symptoms started a couple of days ago but has worsened. She reports that she is cramping all over her abdomen. She's vomited twice this afternoon. She reports that it was very mucousy in appearance. The diarrhea has been an innumerable amount of times. She rates her pain 8 out of 10 in intensity. She denies recent antibiotics but has been treated for C. difficile in the past and reports that this is similar to when she had C. difficile. The patient denies any chest pain or any pain when she urinates. She has some mild shortness of breath. The patient decided to come into the hospital today for further evaluation of the symptoms.   Past Medical History:  Diagnosis Date  . Anemia   . Aortic stenosis, severe    a. s/p Magna Ease pericardial tissue valve size 21 mm replacement in 10/2011 for severe AS 11/12; b. echo 07/2015: EF 55-60% mod concentric LVH, GR1DD, LA mildly dilated, PASP 45 mm Hg  . Atrial tachycardia, paroxysmal (HCC)    a. with rate related LBBB.  . Cardiac pacemaker -st Judes    11/12  . Chronic diastolic heart failure ([place])    a. echo 2014: EF 55-60%, no RWMA, GR1DD, PASP 47 mm Hg; b. echo 07/2015: EF 55-60% mod concentric LVH, GR1DD, LA mildly dilated, PASP 45 mm Hg  . Complete heart block [facility])    a. s/p St Jude PPM 10/2011 (Ser # [id number]).  . Coronary artery disease    a. s/p 2v CABG 11/12 (VG-LAD, VG-OM1); b. Lexiscan 08/2015: low risk, no ischemia, EF 55-65%.  . Enthesopathy of hip region   . Esophageal reflux    followed by  Dr.Seigal. stabilized with a combination of Nexium and Zantac  . Fibromyalgia   . HLD (hyperlipidemia)   . Hypertensive heart disease   . Knee joint replacement by other means   . Morbid obesity ([place])   . Neuralgia, neuritis, and radiculitis, unspecified   . Neuropathy ([place])   . Onychia and paronychia of toe   . Osteoarthrosis, unspecified whether generalized or localized, pelvic region and thigh    mainly in her back and knees  . PAF (paroxysmal atrial fibrillation) ([place])    a. brief episodes of AF previously noted on device interrogations.  Marland Kitchen RAD (reactive airway disease)    a. chronic SOB  . Spinal stenosis   . Stress incontinence, female    followed by Dr.Cope  . Type II or unspecified type diabetes mellitus without mention of complication, not stated as uncontrolled    a. pt. reports that she is borderline   . Wide-complex tachycardia ([place])    a. Noted 4/10 - brief episode in ED. Noted again 4/21 in clinic->felt most likely to be atrial tach.    Patient Active Problem List   Diagnosis Date Noted  . Nausea and vomiting   . Atrial fibrillation with RVR ([place]) 10/11/2016  . Hypokalemia 10/11/2016  . DNR (do not resuscitate) 09/19/2016  . Palliative care by specialist 09/19/2016  . Muscle weakness (generalized)   . Atrial tachycardia ([place])   .  Diarrhea 09/17/2016  . Dehydration 09/17/2016  . Metabolic acidosis [id number]  . Headache due to trauma 07/03/2016  . Hypertensive heart disease   . Wide-complex tachycardia ([place])   . Pain in the chest   . Emesis   . Weakness of both legs   . Coronary artery disease involving coronary bypass graft of native heart with unspecified angina pectoris   . S/P AVR (aortic valve replacement)   . Falls frequently   . HLD (hyperlipidemia)   . Fibromyalgia   . RAD (reactive airway disease)   . Morbid obesity ([place])   . Aortic stenosis, severe   . Chronic pain 06/17/2014  . Pain in the muscles 06/19/2013  . Chest pain 06/19/2013  .  Wheezing 06/19/2013  . Weakness generalized 06/03/2013  . Dyspnea 01/27/2013  . Nausea 03/05/2012  . Constipation 03/05/2012  . Atrial fibrillation-non sustained 02/09/2012  . Cardiac pacemaker -st Judes   . Chronic diastolic heart failure ([place])   . Coronary artery disease   . Long term (current) use of anticoagulants 01/03/2012  . S/P CABG x 2 11/14/2011  . S/P aortic valve replacement with porcine valve 11/14/2011  . Back pain 11/14/2011  . Complete heart block ([place]) 10/20/2011  . HTN (hypertension) 09/15/2011  . Spinal stenosis 03/22/2011  . Fatigue 03/22/2011  . Hyperlipidemia 03/22/2011    Past Surgical History:  Procedure Laterality Date  . AORTIC VALVE REPLACEMENT  10/17/2011   Procedure: AORTIC VALVE REPLACEMENT (AVR);  Surgeon: Melrose Nakayama, MD;  Location: [place];  Service: Open Heart Surgery;  Laterality: N/A;  . CARDIAC CATHETERIZATION  08/2009   50% stenosis distal left main, 50% stenosis ostial left circumflex.   Marland Kitchen CARDIAC CATHETERIZATION     at [facility]  . CORONARY ARTERY BYPASS GRAFT  10/17/2011   Procedure: CORONARY ARTERY BYPASS GRAFTING (CABG);  Surgeon: Melrose Nakayama, MD;  Location: [place];  Service: Open Heart Surgery;  Laterality: N/A;  Times Two, using right leg greater saphenous vein harvested endoscopically  . ESOPHAGOGASTRODUODENOSCOPY (EGD) WITH PROPOFOL N/A 10/12/2016   Procedure: ESOPHAGOGASTRODUODENOSCOPY (EGD) WITH PROPOFOL;  Surgeon: Lucilla Lame, MD;  Location: [facility] ENDOSCOPY;  Service: Endoscopy;  Laterality: N/A;  . EYE SURGERY     IOL/ after cataracts removed- bilateral   . NASAL SINUS SURGERY  2009  . PERMANENT PACEMAKER INSERTION N/A 10/20/2011   Procedure: PERMANENT PACEMAKER INSERTION;  Surgeon: Thompson Grayer, MD;  Location: [facility] CATH LAB;  Service: Cardiovascular;  Laterality: N/A;  . REPLACEMENT TOTAL KNEE  11/2006   right knee  . TOTAL HIP ARTHROPLASTY  09/2010    Prior to Admission medications   Medication Sig Start Date End  Date Taking? Authorizing Provider  acetaminophen (TYLENOL) 325 MG tablet Take 650 mg by mouth 3 (three) times daily as needed.    Historical Provider, MD  acidophilus (RISAQUAD) CAPS capsule Take 1 capsule by mouth 2 (two) times daily. 06/26/16   Bettey Costa, MD  aspirin EC 81 MG tablet Take 81 mg by mouth daily.    Historical Provider, MD  atorvastatin (LIPITOR) 80 MG tablet Take 80 mg by mouth daily.    Historical Provider, MD  Calcium Carbonate-Vitamin D 600-400 MG-UNIT tablet Take 1 tablet by mouth daily.    Historical Provider, MD  Cholecalciferol 1000 units capsule Take 1,000 Units by mouth daily.    Historical Provider, MD  diltiazem (CARDIZEM CD) 120 MG 24 hr capsule Take 1 capsule (120 mg total) by mouth daily. Patient not taking: Reported on 10/11/2016  09/21/16   Vaughan Basta, MD  famotidine (PEPCID) 20 MG tablet Take 1 tablet (20 mg total) by mouth 2 (two) times daily. 10/19/16   Alexis Hugelmeyer, DO  furosemide (LASIX) 20 MG tablet Take 1 tablet (20 mg total) by mouth every other day. 04/06/16   Rogelia Mire, NP  HYDROcodone-acetaminophen (NORCO) 10-325 MG tablet Take 1 tablet by mouth every 4 (four) hours as needed for moderate pain. Reported on 05/30/2016 06/26/16   Bettey Costa, MD  magnesium hydroxide (MILK OF MAGNESIA) 400 MG/5ML suspension Take 10 mLs by mouth daily as needed for mild constipation.     Historical Provider, MD  metoCLOPramide (REGLAN) 5 MG tablet Take 1 tablet (5 mg total) by mouth every 6 (six) hours as needed for nausea or vomiting. 09/21/16   Vaughan Basta, MD  nitroGLYCERIN (NITROSTAT) 0.4 MG SL tablet Place 1 tablet (0.4 mg total) under the tongue every 5 (five) minutes as needed for chest pain. 01/24/13   Minna Merritts, MD  ondansetron (ZOFRAN-ODT) 4 MG disintegrating tablet Take 1 tablet (4 mg total) by mouth every 8 (eight) hours as needed for nausea or vomiting. 10/07/16   Carrie Mew, MD  promethazine (PHENERGAN) 12.5 MG tablet Take 1  tablet (12.5 mg total) by mouth every 6 (six) hours as needed for nausea or vomiting. 10/16/16   Nicholes Mango, MD  promethazine (PHENERGAN) 25 MG tablet Take 1 tablet (25 mg total) by mouth every 6 (six) hours as needed for nausea or vomiting. 10/19/16   Alexis Hugelmeyer, DO  traMADol (ULTRAM) 50 MG tablet Take 1 tablet (50 mg total) by mouth every 6 (six) hours as needed. 10/16/16 10/16/17  Nicholes Mango, MD    Allergies Citalopram; Cymbalta [duloxetine hcl]; Imipramine; Proton pump inhibitors; and Venlafaxine  Family History  Problem Relation Age of Onset  . Heart disease Sister   . Heart disease Brother   . Heart disease Brother   . Heart disease Brother   . Diabetes Other   . Anesthesia problems Neg Hx   . Hypotension Neg Hx   . Malignant hyperthermia Neg Hx   . Pseudochol deficiency Neg Hx     Social History Social History  Substance Use Topics  . Smoking status: Never Smoker  . Smokeless tobacco: Never Used  . Alcohol use No    Review of Systems Constitutional: No fever/chills Eyes: No visual changes. ENT: No sore throat. Cardiovascular: Denies chest pain. Respiratory: Denies shortness of breath. Gastrointestinal:  abdominal pain, nausea, vomiting, diarrhea.  No constipation. Genitourinary: Negative for dysuria. Musculoskeletal: Negative for back pain. Skin: Negative for rash. Neurological: Negative for headaches, focal weakness or numbness.  10-point ROS otherwise negative.  ____________________________________________   PHYSICAL EXAM:  VITAL SIGNS: ED Triage Vitals  Enc Vitals Group     BP 11/01/16 0532 (!) 193/94     Pulse Rate 11/01/16 0532 87     Resp 11/01/16 0532 18     Temp 11/01/16 0532 98.3 F (36.8 C)     Temp src --      SpO2 11/01/16 0532 93 %     Weight 11/01/16 0533 254 lb (115.2 kg)     Height 11/01/16 0533 5\' 4"  (1.626 m)     Head Circumference --      Peak Flow --      Pain Score 11/01/16 0533 8     Pain Loc --      Pain Edu? --        Excl. in  GC? --     Constitutional: Alert and oriented. Well appearing and in Mild distress. Eyes: Conjunctivae are normal. PERRL. EOMI. Head: Atraumatic. Nose: No congestion/rhinnorhea. Mouth/Throat: Mucous membranes are moist.  Oropharynx non-erythematous. Cardiovascular: Normal rate, regular rhythm. Grossly normal heart sounds.  Good peripheral circulation. Respiratory: Normal respiratory effort.  No retractions. Lungs CTAB. Gastrointestinal: Soft and nontender. No distention. Positive bowel sounds Genitourinary:  Musculoskeletal: No lower extremity tenderness nor edema.   Neurologic:  Normal speech and language.  Skin:  Skin is warm, dry and intact. Marland Kitchen Psychiatric: Mood and affect are normal.   ____________________________________________   LABS (all labs ordered are listed, but only abnormal results are displayed)  Labs Reviewed  CBC - Abnormal; Notable for the following:       Result Value   Hemoglobin 11.7 (*)    RDW 16.9 (*)    All other components within normal limits  COMPREHENSIVE METABOLIC PANEL - Abnormal; Notable for the following:    Potassium 2.7 (*)    Glucose, Bld 155 (*)    BUN <5 (*)    All other components within normal limits  LACTIC ACID, PLASMA - Abnormal; Notable for the following:    Lactic Acid, Venous 2.7 (*)    All other components within normal limits  C DIFFICILE QUICK SCREEN W PCR REFLEX  LACTIC ACID, PLASMA   ____________________________________________  EKG  ED ECG REPORT I, Loney Hering, the attending physician, personally viewed and interpreted this ECG.   Date: 11/01/2016  EKG Time: 531  Rate: 83  Rhythm: normal sinus rhythm  Axis: normal  Intervals:none  ST&T Change: none  ____________________________________________  RADIOLOGY  none ____________________________________________   PROCEDURES  Procedure(s) performed: None  Procedures  Critical Care performed:  No  ____________________________________________   INITIAL IMPRESSION / ASSESSMENT AND PLAN / ED COURSE  Pertinent labs & imaging results that were available during my care of the patient were reviewed by me and considered in my medical decision making (see chart for details).  This is an 82 year old female comes into the hospital today with vomiting and diarrhea. The patient reports that this going on for a few days but she came in for evaluation. She is also having some abdominal pain. The patient was seen earlier in the month with abdominal pain and had a CT scan that was unremarkable. The patient has had C. difficile in the past as well as some lactic acidosis and hypokalemia. I will give the patient 500, bolus of normal saline and I will check C. difficile as well as a CMP and a lactic acid. I will reassess the patient once I received some of the results.  Clinical Course    The patient's C. difficile is negative but she does have a lactic acidosis as well as hypokalemia. I will order for the patient KCl and I will admit the patient to the hospitalist service for hydration.  ____________________________________________   FINAL CLINICAL IMPRESSION(S) / ED DIAGNOSES  Final diagnoses:  Nausea vomiting and diarrhea  Generalized abdominal pain  Lactic acidosis  Hypokalemia      NEW MEDICATIONS STARTED DURING THIS VISIT:  New Prescriptions   No medications on file     Note:  This document was prepared using Dragon voice recognition software and may include unintentional dictation errors.    Loney Hering, MD 11/01/16 [phone]

## 2016-11-01 NOTE — Progress Notes (Signed)
Patient is alert and oriented but very forgetful.  Having some intermittent abdominal pain and nausea which seems to be relieved with medications.  NSR on monitor.

## 2016-11-01 NOTE — ED Notes (Signed)
Patient transported to Ultrasound 

## 2016-11-01 NOTE — Progress Notes (Addendum)
MEDICATION RELATED CONSULT NOTE - INITIAL   Pharmacy Consult for Electrolyte replacement Indication: hypokalemia/hypomagnesia  Allergies  Allergen Reactions  . Citalopram Other (See Comments)    Reaction:  Altered mental status   . Cymbalta [Duloxetine Hcl] Other (See Comments)    Reaction:  Sedative for pt   . Imipramine Other (See Comments)    Reaction:  Unknown   . Proton Pump Inhibitors Other (See Comments)    Reaction:  Unknown   . Venlafaxine Nausea And Vomiting and Other (See Comments)    Reaction:  Dizziness     Patient Measurements: Height: 5\' 4"  (162.6 cm) Weight: 254 lb (115.2 kg) IBW/kg (Calculated) : 54.7 Adjusted Body Weight:   Vital Signs: Temp: 98.4 F (36.9 C) (11/29 1038) Temp Source: Oral (11/29 1038) BP: 149/83 (11/29 1038) Pulse Rate: 81 (11/29 1038) Intake/Output from previous day: No intake/output data recorded. Intake/Output from this shift: Total I/O In: 860 [P.O.:360; IV Piggyback:500] Out: -   Labs:  Recent Labs  11/01/16 0613  WBC 4.9  HGB 11.7*  HCT 36.1  PLT 271  CREATININE 0.56  ALBUMIN 3.5  PROT 7.6  AST 34  ALT 17  ALKPHOS 86  BILITOT 0.5   Lab Results  Component Value Date   K 2.7 (LL) 11/01/2016   Estimated Creatinine Clearance: 66.4 mL/min (by C-G formula based on SCr of 0.56 mg/dL).  Medications:  Scheduled:  . acidophilus  1 capsule Oral BID  . aspirin EC  81 mg Oral Daily  . atorvastatin  80 mg Oral Daily  . diltiazem  120 mg Oral Daily  . enoxaparin (LOVENOX) injection  40 mg Subcutaneous BID  . LORazepam  0.5 mg Intravenous Once  . metoCLOPramide (REGLAN) injection  5 mg Intravenous Q6H  . potassium chloride (KCL MULTIRUN) 30 mEq in 265 mL IVPB  30 mEq Intravenous Once   Infusions:  . 0.9 % NaCl with KCl 40 mEq / L 75 mL/hr (11/01/16 1125)    Assessment: 82 yo F w/ intractable nausea, vomiting, diarrhea for the past 2 days. ?viral gasteroenteritis. Home lasix on hold.  Hx: morbid obesity, severe  aortic stenosis, diastolic CHF, Afib, pacemaker, chronic SOB, DM, GERD   Plan:  K+=2.7, Mag= 2.1  Patient was ordered NS w/ 69meq KCL/Liter in ER but this was not given per [facility] and discussion with floor RN. *Currently ordered NS w/ KCL 23meq at 75 ml/hr.  Per MD, patient probably not able to take po due to nausea at this time. Will order KCL 68meq IV x 1 run.     Michele Schultz A 11/01/2016,3:27 PM

## 2016-11-01 NOTE — ED Notes (Signed)
Attempted to call report, RN with another pt at this time.  Ascom number and name left for RN to call back.

## 2016-11-01 NOTE — ED Notes (Signed)
Pt placed on bed pan per her request to have a BM.

## 2016-11-01 NOTE — ED Notes (Signed)
Pt with new onset chest pain and gasping for breath after dilaudid and zofran given oxygen saturation 96-99% at this time.  Dr. Jimmye Norman to bedside, Duoneb given.

## 2016-11-01 NOTE — Progress Notes (Signed)
Anticoagulation monitoring(Lovenox):  82yo female ordered Lovenox 40 mg Q24h  Filed Weights   11/01/16 0533  Weight: 254 lb (115.2 kg)   BMI 43.4   Lab Results  Component Value Date   CREATININE 0.56 11/01/2016   CREATININE 0.55 10/21/2016   CREATININE 0.62 10/16/2016   Estimated Creatinine Clearance: 66.4 mL/min (by C-G formula based on SCr of 0.56 mg/dL). Hemoglobin & Hematocrit     Component Value Date/Time   HGB 11.7 (L) 11/01/2016 0613   HGB 11.9 (L) 12/04/2014 1413   HCT 36.1 11/01/2016 0613   HCT 36.4 08/25/2015 1552     Per Protocol for Patient with estCrCl >30 ml/min and BMI >40, will transition to Lovenox 40 mg every 12 hours.

## 2016-11-02 DIAGNOSIS — F4321 Adjustment disorder with depressed mood: Secondary | ICD-10-CM

## 2016-11-02 LAB — CBC
HEMATOCRIT: 33.2 % — AB (ref 35.0–47.0)
HEMOGLOBIN: 10.7 g/dL — AB (ref 12.0–16.0)
MCH: 26.7 pg (ref 26.0–34.0)
MCHC: 32.3 g/dL (ref 32.0–36.0)
MCV: 82.6 fL (ref 80.0–100.0)
Platelets: 260 10*3/uL (ref 150–440)
RBC: 4.02 MIL/uL (ref 3.80–5.20)
RDW: 16.4 % — ABNORMAL HIGH (ref 11.5–14.5)
WBC: 5.2 10*3/uL (ref 3.6–11.0)

## 2016-11-02 LAB — BASIC METABOLIC PANEL
ANION GAP: 7 (ref 5–15)
CHLORIDE: 107 mmol/L (ref 101–111)
CO2: 29 mmol/L (ref 22–32)
Calcium: 8.2 mg/dL — ABNORMAL LOW (ref 8.9–10.3)
Creatinine, Ser: 0.51 mg/dL (ref 0.44–1.00)
GFR calc Af Amer: >60 mL/min (ref >60)
GLUCOSE: 136 mg/dL — AB (ref 65–99)
POTASSIUM: 3.1 mmol/L — AB (ref 3.5–5.1)
SODIUM: 143 mmol/L (ref 135–145)

## 2016-11-02 LAB — POTASSIUM: Potassium: 3.2 mmol/L — ABNORMAL LOW (ref 3.5–5.1)

## 2016-11-02 MED ORDER — POTASSIUM CHLORIDE CRYS ER 20 MEQ PO TBCR
40.0000 meq | EXTENDED_RELEASE_TABLET | Freq: Once | ORAL | Status: AC
Start: 2016-11-02 — End: 2016-11-02
  Administered 2016-11-02: 40 meq via ORAL
  Filled 2016-11-02: qty 2

## 2016-11-02 MED ORDER — SODIUM CHLORIDE 0.9 % IV SOLN
30.0000 meq | INTRAVENOUS | Status: AC
  Administered 2016-11-02 (×2): 30 meq via INTRAVENOUS
  Filled 2016-11-02 (×2): qty 15

## 2016-11-02 MED ORDER — POTASSIUM CHLORIDE 20 MEQ PO PACK: 20.0000 meq | PACK | Freq: Two times a day (BID) | ORAL | Status: DC

## 2016-11-02 NOTE — Care Management (Signed)
patient presents from [facility] assisted living.  Has been evaluated by physical therapy and recommending skilled nursing placement

## 2016-11-02 NOTE — Progress Notes (Signed)
Initial Nutrition Assessment  DOCUMENTATION CODES:   Morbid obesity  INTERVENTION:  1. Monitor and Encourage PO Intake 2. Patient does not like ONS, will not provide at this time, monitor need for snacks - but given obesity, unlikely.  NUTRITION DIAGNOSIS:   Inadequate oral intake related to poor appetite, nausea, vomiting as evidenced by per patient/family report.  GOAL:   Patient will meet greater than or equal to 90% of their needs  MONITOR:   PO intake, I & O's, Labs, Weight trends  REASON FOR ASSESSMENT:   Low [place]    ASSESSMENT:   Michele Schultz  is a 82 y.o. female with a known history of Severe aortic stenosis, chronic diastolic heart failure, morbid obesity, proximal atrial fibrillation comes in because of intractable nausea, vomiting, diarrhea for the past 2 days  Michele Schultz received dilaudid shortly before my visit, was unable to provide any history. Per RN she was confused and agitated prior to receiving dilaudid, likely would be unable to provide much history. Saw patient upon previous admission -> she was experiencing nausea/vomiting, not eating much similar to this admission. Per RN she tolerated 2g NA diet for breakfast. Nutrition-Focused physical exam completed. Findings are no fat depletion, no muscle depletion, and modearte edema.  Wt is up, but patient also exhibits some edema today. Labs and medications reviewed: K 3.1 Cardizem, Reglan NS w/ KCL 18mEq @ 6mL/hr   Diet Order:  Diet 2 gram sodium Room service appropriate? Yes; Fluid consistency: Thin  Skin:  Reviewed, no issues  Last BM:  11/01/2016  Height:   Ht Readings from Last 1 Encounters:  11/01/16 5\' 4"  (1.626 m)    Weight:   Wt Readings from Last 1 Encounters:  11/01/16 254 lb (115.2 kg)    Ideal Body Weight:  54.54 kg  BMI:  Body mass index is 43.6 kg/m.  Estimated Nutritional Needs:   Kcal:  1700-2100 calories  Protein:  116 gm  Fluid:  >/= 1.7L  EDUCATION  NEEDS:   No education needs identified at this time  Michele Schultz. Michele Tornow, MS, RD LDN Inpatient Clinical Dietitian Pager [phone]

## 2016-11-02 NOTE — Consult Note (Signed)
[facility] Face-to-Face Psychiatry Consult   Reason for Consult:  Consult for this 82 year old woman currently in the hospital with multiple medical problems. Question about depression Referring Physician:  Vianne Bulls Patient Identification: Michele Schultz MRN:  [id number] Principal Diagnosis: Adjustment disorder with depressed mood Diagnosis:   Patient Active Problem List   Diagnosis Date Noted  . Adjustment disorder with depressed mood [F43.21] 11/02/2016  . Nausea & vomiting [R11.2] 11/01/2016  . Nausea and vomiting [R11.2]   . Atrial fibrillation with RVR ([place]) [I48.91] 10/11/2016  . Hypokalemia [E87.6] 10/11/2016  . DNR (do not resuscitate) [Z66] 09/19/2016  . Palliative care by specialist [Z51.5] 09/19/2016  . Muscle weakness (generalized) [M62.81]   . Atrial tachycardia ([place]) [I47.1]   . Diarrhea [R19.7] 09/17/2016  . Dehydration [E86.0] 09/17/2016  . Metabolic acidosis [N23.5] 09/17/2016  . Headache due to trauma [G44.309] 07/03/2016  . Hypertensive heart disease [I11.9]   . Wide-complex tachycardia ([place]) [I47.2]   . Pain in the chest [R07.9]   . Emesis [R11.10]   . Weakness of both legs [R29.898]   . Coronary artery disease involving coronary bypass graft of native heart with unspecified angina pectoris [I25.709]   . S/P AVR (aortic valve replacement) [Z95.2]   . Falls frequently [R29.6]   . HLD (hyperlipidemia) [E78.5]   . Fibromyalgia [M79.7]   . RAD (reactive airway disease) [J45.909]   . Morbid obesity ([place]) [E66.01]   . Aortic stenosis, severe [I35.0]   . Chronic pain [G89.29] 06/17/2014  . Pain in the muscles [M79.1] 06/19/2013  . Chest pain [R07.9] 06/19/2013  . Wheezing [R06.2] 06/19/2013  . Weakness generalized [R53.1] 06/03/2013  . Dyspnea [R06.00] 01/27/2013  . Nausea [R11.0] 03/05/2012  . Constipation [K59.00] 03/05/2012  . Atrial fibrillation-non sustained [I48.91] 02/09/2012  . Cardiac pacemaker -st Judes [Z95.0]   . Chronic diastolic heart failure ([place])  [I50.32]   . Coronary artery disease [I25.10]   . Long term (current) use of anticoagulants [Z79.01] 01/03/2012  . S/P CABG x 2 [Z95.1] 11/14/2011  . S/P aortic valve replacement with porcine valve [Z95.3] 11/14/2011  . Back pain [M54.9] 11/14/2011  . Complete heart block ([place]) [I44.2] 10/20/2011  . HTN (hypertension) [I10] 09/15/2011  . Spinal stenosis [M48.00] 03/22/2011  . Fatigue [R53.83] 03/22/2011  . Hyperlipidemia [E78.5] 03/22/2011    Total Time spent with patient: 45 minutes  Subjective:   Michele Schultz is a 82 y.o. female patient admitted with "I'm fine".  HPI:  Patient interviewed. Chart reviewed. Consult was requested because of perceived depression and reported statements about being lonely. When I came to interview the patient today she claimed that no one had talked with her about a psychiatry consult and she indicated that she thought she had no psychiatric symptoms. She denied being nervous and denied being depressed. Michela Pitcher that she gets along fine at her assisted living facility. She cut the interview short after a few minutes saying she was too tired and her nausea and vomiting were too bad and she did not want to talk about it anymore.  Social history: Patient lives at an assisted living facility. Fairly isolated. Husband died earlier this year and she has no family that she stays in contact with.  Medical history: Multiple medical problems including anemia aortic stenosis and coronary artery disease  Substance abuse: No history of alcohol or drug abuse  Past Psychiatric History: Patient claimed that she had no past psychiatric history at all. Claimed that she had never spoken to a psychiatrist before. This  is incorrect as I know that she is spoken to me on at least one previous occasion a couple years ago. Dr. Gretel Acre was also asked to consult on her earlier this year. Patient is often seen as being depressed but has a history of minimizing her symptoms and being  dismissive of treatment. No history of psychiatric hospitalization. No history of suicide attempts  Risk to Self: Is patient at risk for suicide?: No Risk to Others:   Prior Inpatient Therapy:   Prior Outpatient Therapy:    Past Medical History:  Past Medical History:  Diagnosis Date  . Anemia   . Aortic stenosis, severe    a. s/p Magna Ease pericardial tissue valve size 21 mm replacement in 10/2011 for severe AS 11/12; b. echo 07/2015: EF 55-60% mod concentric LVH, GR1DD, LA mildly dilated, PASP 45 mm Hg  . Atrial tachycardia, paroxysmal (HCC)    a. with rate related LBBB.  . Cardiac pacemaker -st Judes    11/12  . Chronic diastolic heart failure ([place])    a. echo 2014: EF 55-60%, no RWMA, GR1DD, PASP 47 mm Hg; b. echo 07/2015: EF 55-60% mod concentric LVH, GR1DD, LA mildly dilated, PASP 45 mm Hg  . Complete heart block [facility])    a. s/p St Jude PPM 10/2011 (Ser # [id number]).  . Coronary artery disease    a. s/p 2v CABG 11/12 (VG-LAD, VG-OM1); b. Lexiscan 08/2015: low risk, no ischemia, EF 55-65%.  . Enthesopathy of hip region   . Esophageal reflux    followed by Dr.Seigal. stabilized with a combination of Nexium and Zantac  . Fibromyalgia   . HLD (hyperlipidemia)   . Hypertensive heart disease   . Knee joint replacement by other means   . Morbid obesity ([place])   . Neuralgia, neuritis, and radiculitis, unspecified   . Neuropathy ([place])   . Onychia and paronychia of toe   . Osteoarthrosis, unspecified whether generalized or localized, pelvic region and thigh    mainly in her back and knees  . PAF (paroxysmal atrial fibrillation) ([place])    a. brief episodes of AF previously noted on device interrogations.  Marland Kitchen RAD (reactive airway disease)    a. chronic SOB  . Spinal stenosis   . Stress incontinence, female    followed by Dr.Cope  . Type II or unspecified type diabetes mellitus without mention of complication, not stated as uncontrolled    a. pt. reports that she is borderline   .  Wide-complex tachycardia ([place])    a. Noted 4/10 - brief episode in ED. Noted again 4/21 in clinic->felt most likely to be atrial tach.    Past Surgical History:  Procedure Laterality Date  . AORTIC VALVE REPLACEMENT  10/17/2011   Procedure: AORTIC VALVE REPLACEMENT (AVR);  Surgeon: Melrose Nakayama, MD;  Location: [place];  Service: Open Heart Surgery;  Laterality: N/A;  . CARDIAC CATHETERIZATION  08/2009   50% stenosis distal left main, 50% stenosis ostial left circumflex.   Marland Kitchen CARDIAC CATHETERIZATION     at [facility]  . CORONARY ARTERY BYPASS GRAFT  10/17/2011   Procedure: CORONARY ARTERY BYPASS GRAFTING (CABG);  Surgeon: Melrose Nakayama, MD;  Location: [place];  Service: Open Heart Surgery;  Laterality: N/A;  Times Two, using right leg greater saphenous vein harvested endoscopically  . ESOPHAGOGASTRODUODENOSCOPY (EGD) WITH PROPOFOL N/A 10/12/2016   Procedure: ESOPHAGOGASTRODUODENOSCOPY (EGD) WITH PROPOFOL;  Surgeon: Lucilla Lame, MD;  Location: [facility] ENDOSCOPY;  Service: Endoscopy;  Laterality: N/A;  . EYE  SURGERY     IOL/ after cataracts removed- bilateral   . NASAL SINUS SURGERY  2009  . PERMANENT PACEMAKER INSERTION N/A 10/20/2011   Procedure: PERMANENT PACEMAKER INSERTION;  Surgeon: Thompson Grayer, MD;  Location: [facility] CATH LAB;  Service: Cardiovascular;  Laterality: N/A;  . REPLACEMENT TOTAL KNEE  11/2006   right knee  . TOTAL HIP ARTHROPLASTY  09/2010   Family History:  Family History  Problem Relation Age of Onset  . Heart disease Sister   . Heart disease Brother   . Heart disease Brother   . Heart disease Brother   . Diabetes Other   . Anesthesia problems Neg Hx   . Hypotension Neg Hx   . Malignant hyperthermia Neg Hx   . Pseudochol deficiency Neg Hx    Family Psychiatric  History: Denies any family history Social History:  History  Alcohol Use No     History  Drug Use No    Social History   Social History  . Marital status: Widowed    Spouse name: N/A  . Number of  children: N/A  . Years of education: N/A   Social History Main Topics  . Smoking status: Never Smoker  . Smokeless tobacco: Never Used  . Alcohol use No  . Drug use: No  . Sexual activity: Not Asked   Other Topics Concern  . None   Social History Narrative  . None   Additional Social History:    Allergies:   Allergies  Allergen Reactions  . Citalopram Other (See Comments)    Reaction:  Altered mental status   . Cymbalta [Duloxetine Hcl] Other (See Comments)    Reaction:  Sedative for pt   . Imipramine Other (See Comments)    Reaction:  Unknown   . Proton Pump Inhibitors Other (See Comments)    Reaction:  Unknown   . Venlafaxine Nausea And Vomiting and Other (See Comments)    Reaction:  Dizziness     Labs:  Results for orders placed or performed during the hospital encounter of 11/01/16 (from the past 48 hour(s))  C difficile quick scan w PCR reflex     Status: None   Collection Time: 11/01/16  5:44 AM  Result Value Ref Range   C Diff antigen NEGATIVE NEGATIVE   C Diff toxin NEGATIVE NEGATIVE   C Diff interpretation No C. difficile detected.   CBC     Status: Abnormal   Collection Time: 11/01/16  6:13 AM  Result Value Ref Range   WBC 4.9 3.6 - 11.0 K/uL   RBC 4.39 3.80 - 5.20 MIL/uL   Hemoglobin 11.7 (L) 12.0 - 16.0 g/dL   HCT 36.1 35.0 - 47.0 %   MCV 82.3 80.0 - 100.0 fL   MCH 26.7 26.0 - 34.0 pg   MCHC 32.5 32.0 - 36.0 g/dL   RDW 16.9 (H) 11.5 - 14.5 %   Platelets 271 150 - 440 K/uL  Comprehensive metabolic panel     Status: Abnormal   Collection Time: 11/01/16  6:13 AM  Result Value Ref Range   Sodium 143 135 - 145 mmol/L   Potassium 2.7 (LL) 3.5 - 5.1 mmol/L    Comment: CRITICAL RESULT CALLED TO, READ BACK BY AND VERIFIED WITH HENRY RIVERA DE JESUS ON 11/01/16 AT 0650 BY SNJ    Chloride 102 101 - 111 mmol/L   CO2 31 22 - 32 mmol/L   Glucose, Bld 155 (H) 65 - 99 mg/dL   BUN <5 (L)  6 - 20 mg/dL   Creatinine, Ser 0.56 0.44 - 1.00 mg/dL   Calcium 9.1  8.9 - 10.3 mg/dL   Total Protein 7.6 6.5 - 8.1 g/dL   Albumin 3.5 3.5 - 5.0 g/dL   AST 34 15 - 41 U/L   ALT 17 14 - 54 U/L   Alkaline Phosphatase 86 38 - 126 U/L   Total Bilirubin 0.5 0.3 - 1.2 mg/dL   GFR calc non Af Amer >60 >60 mL/min   GFR calc Af Amer >60 >60 mL/min    Comment: (NOTE) The eGFR has been calculated using the CKD EPI equation. This calculation has not been validated in all clinical situations. eGFR's persistently <60 mL/min signify possible Chronic Kidney Disease.    Anion gap 10 5 - 15  Lactic acid, plasma     Status: Abnormal   Collection Time: 11/01/16  6:13 AM  Result Value Ref Range   Lactic Acid, Venous 2.7 (HH) 0.5 - 1.9 mmol/L    Comment: CRITICAL RESULT CALLED TO, READ BACK BY AND VERIFIED WITH HENRY RIVERA DE JESUS AT [phone] ON 11/01/16 BY SNJ   Magnesium     Status: None   Collection Time: 11/01/16  6:13 AM  Result Value Ref Range   Magnesium 2.1 1.7 - 2.4 mg/dL  Lactic acid, plasma     Status: Abnormal   Collection Time: 11/01/16 10:03 AM  Result Value Ref Range   Lactic Acid, Venous 2.4 (HH) 0.5 - 1.9 mmol/L    Comment: CRITICAL RESULT CALLED TO, READ BACK BY AND VERIFIED WITH AMY DALTON_0  ON 11/01/16 BY HKP   Basic metabolic panel     Status: Abnormal   Collection Time: 11/02/16  5:22 AM  Result Value Ref Range   Sodium 143 135 - 145 mmol/L   Potassium 3.1 (L) 3.5 - 5.1 mmol/L   Chloride 107 101 - 111 mmol/L   CO2 29 22 - 32 mmol/L   Glucose, Bld 136 (H) 65 - 99 mg/dL   BUN <5 (L) 6 - 20 mg/dL   Creatinine, Ser 0.51 0.44 - 1.00 mg/dL   Calcium 8.2 (L) 8.9 - 10.3 mg/dL   GFR calc non Af Amer >60 >60 mL/min   GFR calc Af Amer >60 >60 mL/min    Comment: (NOTE) The eGFR has been calculated using the CKD EPI equation. This calculation has not been validated in all clinical situations. eGFR's persistently <60 mL/min signify possible Chronic Kidney Disease.    Anion gap 7 5 - 15  CBC     Status: Abnormal   Collection Time: 11/02/16   5:22 AM  Result Value Ref Range   WBC 5.2 3.6 - 11.0 K/uL   RBC 4.02 3.80 - 5.20 MIL/uL   Hemoglobin 10.7 (L) 12.0 - 16.0 g/dL   HCT 33.2 (L) 35.0 - 47.0 %   MCV 82.6 80.0 - 100.0 fL   MCH 26.7 26.0 - 34.0 pg   MCHC 32.3 32.0 - 36.0 g/dL   RDW 16.4 (H) 11.5 - 14.5 %   Platelets 260 150 - 440 K/uL    Current Facility-Administered Medications  Medication Dose Route Frequency Provider Last Rate Last Dose  . acetaminophen (TYLENOL) tablet 650 mg  650 mg Oral Q6H PRN Epifanio Lesches, MD   650 mg at 11/02/16 0655   Or  . acetaminophen (TYLENOL) suppository 650 mg  650 mg Rectal Q6H PRN Epifanio Lesches, MD      . acidophilus (RISAQUAD) capsule 1 capsule  1  capsule Oral BID Epifanio Lesches, MD   1 capsule at 11/02/16 1004  . atorvastatin (LIPITOR) tablet 80 mg  80 mg Oral Daily Epifanio Lesches, MD   80 mg at 11/02/16 1004  . diltiazem (CARDIZEM CD) 24 hr capsule 120 mg  120 mg Oral Daily Epifanio Lesches, MD   120 mg at 11/02/16 1004  . enoxaparin (LOVENOX) injection 40 mg  40 mg Subcutaneous BID Epifanio Lesches, MD   40 mg at 11/02/16 1004  . hydrALAZINE (APRESOLINE) injection 5 mg  5 mg Intravenous Q4H PRN Epifanio Lesches, MD      . HYDROmorphone (DILAUDID) injection 1 mg  1 mg Intravenous Q3H PRN Epifanio Lesches, MD   1 mg at 11/02/16 1447  . LORazepam (ATIVAN) injection 0.5 mg  0.5 mg Intravenous Once Earleen Newport, MD      . metoCLOPramide [facility]) injection 5 mg  5 mg Intravenous Q6H Epifanio Lesches, MD   5 mg at 11/02/16 1228  . ondansetron (ZOFRAN) tablet 4 mg  4 mg Oral Q6H PRN Epifanio Lesches, MD       Or  . ondansetron (ZOFRAN) injection 4 mg  4 mg Intravenous Q6H PRN Epifanio Lesches, MD   4 mg at 11/01/16 2138  . potassium chloride 30 mEq in sodium chloride 0.9 % 265 mL (KCL MULTIRUN) IVPB  30 mEq Intravenous Q4H Napoleon Form, RPH   30 mEq at 11/02/16 1447    Musculoskeletal: Strength & Muscle Tone: decreased Gait & Station:  unable to stand Patient leans: N/A  Psychiatric Specialty Exam: Physical Exam  Nursing note and vitals reviewed. Constitutional: She appears well-developed and well-nourished. She appears distressed.  HENT:  Head: Normocephalic and atraumatic.  Eyes: Conjunctivae are normal. Pupils are equal, round, and reactive to light.  Neck: Normal range of motion.  Cardiovascular: Regular rhythm and normal heart sounds.   Respiratory: She is in respiratory distress.  GI: Soft.  Musculoskeletal: Normal range of motion.  Neurological: She is alert.  Skin: Skin is warm and dry.  Psychiatric: Her affect is blunt. Her speech is delayed. She is slowed and withdrawn. Cognition and memory are normal. She expresses no homicidal and no suicidal ideation.    Review of Systems  Constitutional: Negative.   HENT: Negative.   Eyes: Negative.   Respiratory: Negative.   Cardiovascular: Negative.   Gastrointestinal: Positive for nausea and vomiting.  Musculoskeletal: Negative.   Skin: Negative.   Neurological: Negative.   Psychiatric/Behavioral: Negative for depression, hallucinations, memory loss, substance abuse and suicidal ideas. The patient is not nervous/anxious and does not have insomnia.     Blood pressure (!) 158/80, pulse 96, temperature 99.3 F (37.4 C), resp. rate 18, height _0  (1.626 m), weight 115.2 kg (254 lb), SpO2 96 %.Body mass index is 43.6 kg/m.  General Appearance: Guarded  Eye Contact:  Minimal  Speech:  Slow  Volume:  Decreased  Mood:  Euthymic  Affect:  Depressed  Thought Process:  Coherent  Orientation:  Full (Time, Place, and Person)  Thought Content:  Logical  Suicidal Thoughts:  No  Homicidal Thoughts:  No  Memory:  Immediate;   Good Recent;   Fair Remote;   Fair  Judgement:  Fair  Insight:  Shallow  Psychomotor Activity:  Decreased  Concentration:  Concentration: Fair  Recall:  [employer] of Knowledge:  Fair  Language:  Fair  Akathisia:  No  Handed:  Right   AIMS (if indicated):     Assets:  Desire  for Improvement Financial Resources/Insurance Housing  ADL's:  Intact  Cognition:  WNL  Sleep:        Treatment Plan Summary: Plan 82 year old woman with a history of possible depression although minimal outpatient follow-up. Currently she is denying any symptoms of depression and anxiety and focusing entirely on her medical problems. Affect does look dysphoric but her thoughts appear to be coherent there is no sign of psychosis and she seems to be basically cognitively intact. Patient eventually made it clear that she preferred to not have any further conversation. I am going to sign off at this point unless there is a specific further need in which case please reconsult me.  Disposition: Patient does not meet criteria for psychiatric inpatient admission.  Alethia Berthold, MD 11/02/2016 5:30 PM

## 2016-11-02 NOTE — Evaluation (Signed)
Physical Therapy Evaluation Patient Details Name: CAYCEE MATTHIAS MRN: [id number] DOB: June 01, 1931 Today's Date: 11/02/2016   History of Present Illness  Bani Um  is a 82 y.o. female with a known history of Severe aortic stenosis, chronic diastolic heart failure, morbid obesity, proximal atrial fibrillation comes in because of intractable nausea, vomiting, diarrhea for the past 2 days.  Clinical Impression  Pt presents to PT with decreased functional mobility, strength and endurance and would benefit from acute PT services to address objective findings.  Pt required Mod A for bed mobility and Min A +2 for stand pivot transfers.  Pt limited by nausea this date and unable to walk around bed to sit up in chair.  Pt returned to bed for comfort and agreeable to continue with therapy at next date.       Follow Up Recommendations SNF ([facility])    Equipment Recommendations  None recommended by PT    Recommendations for Other Services       Precautions / Restrictions Precautions Precautions: Fall Restrictions Weight Bearing Restrictions: No      Mobility  Bed Mobility Overal bed mobility: Needs Assistance Bed Mobility: Supine to Sit;Sit to Supine     Supine to sit: Mod assist;HOB elevated Sit to supine: Max assist;HOB elevated   General bed mobility comments: able to perform about 50% of transfer, needing assist to achieve midline and scoot to EOB  Transfers Overall transfer level: Needs assistance Equipment used: Rolling walker (2 wheeled) Transfers: Sit to/from [facility] Sit to Stand: Min assist Stand pivot transfers: Min assist       General transfer comment: assist for lift off from bed, verbal and tactile cues to transfer to [facility] and to guide hips.  Ambulation/Gait Ambulation/Gait assistance: Min guard Ambulation Distance (Feet): 3 Feet Assistive device: Rolling walker (2 wheeled) Gait Pattern/deviations:  (side stepping)     General  Gait Details: side stepping along bed in order to move to [facility] for better positioning  Stairs            Wheelchair Mobility    Modified Rankin (Stroke Patients Only)       Balance Overall balance assessment: Needs assistance Sitting-balance support: Feet unsupported Sitting balance-Leahy Scale: Good     Standing balance support: Bilateral upper extremity supported;During functional activity Standing balance-Leahy Scale: Fair Standing balance comment: poor posture with forward trunk flexion                             Pertinent Vitals/Pain Pain Assessment: No/denies pain    Home Living Family/patient expects to be discharged to:: Skilled nursing facility ([facility])               Home Equipment: Gilford Rile - 4 wheels;Wheelchair - manual      Prior Function Level of Independence: Needs assistance   Gait / Transfers Assistance Needed: Reports limited ambulation when she feels up to it using a walker.  ADL's / Homemaking Assistance Needed: Assist required at ALF  Comments: Poor historian and easily agitated with questioning.     Hand Dominance        Extremity/Trunk Assessment   Upper Extremity Assessment: Generalized weakness           Lower Extremity Assessment: Generalized weakness         Communication   Communication: No difficulties  Cognition Arousal/Alertness: Awake/alert Behavior During Therapy: WFL for tasks assessed/performed Overall Cognitive Status: No family/caregiver  present to determine baseline cognitive functioning   Orientation Level: Disoriented to;Situation   Memory: Decreased short-term memory              General Comments General comments (skin integrity, edema, etc.): Pt completely wet with urine, agreeable to linen and gown change; Max A for positioning in bed and to scoot up in bed with Max A +2    Exercises Other Exercises Other Exercises: Standing balance during peri-care; requires total  assist to wipe.   Assessment/Plan    PT Assessment Patient needs continued PT services  PT Problem List Decreased strength;Decreased activity tolerance;Decreased balance;Decreased mobility;Decreased knowledge of use of DME;Decreased safety awareness;Obesity          PT Treatment Interventions Gait training;DME instruction;Therapeutic exercise;Therapeutic activities;Functional mobility training;Balance training;Neuromuscular re-education;Patient/family education;Cognitive remediation    PT Goals (Current goals can be found in the Care Plan section)  Acute Rehab PT Goals Patient Stated Goal: To be able to get up wihtout feeling sick. PT Goal Formulation: With patient Time For Goal Achievement: 11/16/16 Potential to Achieve Goals: Fair    Frequency Min 2X/week   Barriers to discharge        Co-evaluation               End of Session Equipment Utilized During Treatment: Gait belt Activity Tolerance: Patient limited by fatigue;Other (comment) (nausea) Patient left: in bed;with call bell/phone within reach;with bed alarm set;with nursing/sitter in room Nurse Communication: Mobility status;Precautions         Time: [id number] PT Time Calculation (min) (ACUTE ONLY): 39 min   Charges:   PT Evaluation $PT Eval Moderate Complexity: 1 Procedure PT Treatments $Therapeutic Activity: 8-22 mins   PT G Codes:        Nason Conradt A Ryer Asato, PT 11/02/2016, 1:55 PM

## 2016-11-02 NOTE — Progress Notes (Signed)
MEDICATION RELATED CONSULT NOTE - INITIAL   Pharmacy Consult for Electrolyte replacement Indication: hypokalemia/hypomagnesia  Allergies  Allergen Reactions  . Citalopram Other (See Comments)    Reaction:  Altered mental status   . Cymbalta [Duloxetine Hcl] Other (See Comments)    Reaction:  Sedative for pt   . Imipramine Other (See Comments)    Reaction:  Unknown   . Proton Pump Inhibitors Other (See Comments)    Reaction:  Unknown   . Venlafaxine Nausea And Vomiting and Other (See Comments)    Reaction:  Dizziness     Patient Measurements: Height: 5\' 4"  (162.6 cm) Weight: 254 lb (115.2 kg) IBW/kg (Calculated) : 54.7 Adjusted Body Weight:   Vital Signs: Temp: 99.3 F (37.4 C) (11/30 0745) Temp Source: Oral (11/30 0512) BP: 158/80 (11/30 0745) Pulse Rate: 96 (11/30 0745) Intake/Output from previous day: 11/29 0701 - 11/30 0700 In: 2563.8 [P.O.:480; I.V.:1318.8; IV Piggyback:765] Out: -  Intake/Output from this shift: No intake/output data recorded.  Labs:  Recent Labs  11/01/16 0613 11/02/16 0522  WBC 4.9 5.2  HGB 11.7* 10.7*  HCT 36.1 33.2*  PLT 271 260  CREATININE 0.56 0.51  MG 2.1  --   ALBUMIN 3.5  --   PROT 7.6  --   AST 34  --   ALT 17  --   ALKPHOS 86  --   BILITOT 0.5  --    Lab Results  Component Value Date   K 3.1 (L) 11/02/2016   Estimated Creatinine Clearance: 66.4 mL/min (by C-G formula based on SCr of 0.51 mg/dL).  Medications:  Scheduled:  . acidophilus  1 capsule Oral BID  . aspirin EC  81 mg Oral Daily  . atorvastatin  80 mg Oral Daily  . diltiazem  120 mg Oral Daily  . enoxaparin (LOVENOX) injection  40 mg Subcutaneous BID  . LORazepam  0.5 mg Intravenous Once  . metoCLOPramide (REGLAN) injection  5 mg Intravenous Q6H  . potassium chloride (KCL MULTIRUN) 30 mEq in 265 mL IVPB  30 mEq Intravenous Q4H   Infusions:  . 0.9 % NaCl with KCl 40 mEq / L 75 mL/hr (11/02/16 0013)    Assessment: 82 yo F w/ intractable nausea,  vomiting, diarrhea for the past 2 days. ?viral gasteroenteritis. Home lasix on hold.  Hx: morbid obesity, severe aortic stenosis, diastolic CHF, Afib, pacemaker, chronic SOB, DM, GERD   Plan:  KCl 30 meq iv x 2 and f/u repeat K in PM.   Ulice Dash D 11/02/2016,10:43 AM

## 2016-11-02 NOTE — Progress Notes (Signed)
[facility] [place] NAME: Michele Schultz    MR#:  [id number]  DATE OF BIRTH:  08-15-1931  SUBJECTIVE: Admitted yesterday for intractable nausea, vomiting, severe hypokalemia. Today she says she feels little better but still a lot of nausea. But tolerated the break for this morning. Is that she is depressed and lonely she lives by herself. All her relatives ,her husband  Are diseased/she has no children.   CHIEF COMPLAINT:   Chief Complaint  Patient presents with  . Emesis    REVIEW OF SYSTEMS:   Review of Systems  Psychiatric/Behavioral: Positive for depression.   CONSTITUTIONAL: No fever, fatigue or weakness.  EYES: No blurred or double vision.  EARS, NOSE, AND THROAT: No tinnitus or ear pain.  RESPIRATORY: No cough, shortness of breath, wheezing or hemoptysis.  CARDIOVASCULAR: No chest pain, orthopnea, edema.  GASTROINTESTINAL: nausea.  No vomiting, diarrhea or abdominal pain.  GENITOURINARY: No dysuria, hematuria.  ENDOCRINE: No polyuria, nocturia,  HEMATOLOGY: No anemia, easy bruising or bleeding SKIN: No rash or lesion. MUSCULOSKELETAL: No joint pain or arthritis.   NEUROLOGIC: No tingling, numbness, weakness.  PSYCHIATRY: depressed.  DRUG ALLERGIES:   Allergies  Allergen Reactions  . Citalopram Other (See Comments)    Reaction:  Altered mental status   . Cymbalta [Duloxetine Hcl] Other (See Comments)    Reaction:  Sedative for pt   . Imipramine Other (See Comments)    Reaction:  Unknown   . Proton Pump Inhibitors Other (See Comments)    Reaction:  Unknown   . Venlafaxine Nausea And Vomiting and Other (See Comments)    Reaction:  Dizziness     VITALS:  Blood pressure (!) 158/80, pulse 96, temperature 99.3 F (37.4 C), resp. rate 18, height 5\' 4"  (1.626 m), weight 115.2 kg (254 lb), SpO2 96 %.  PHYSICAL EXAMINATION:  GENERAL:  82 y.o.-year-old patient lying in the bed with no acute distress.  EYES: Pupils  equal, round, reactive to light and accommodation. No scleral icterus. Extraocular muscles intact.  HEENT: Head atraumatic, normocephalic. Oropharynx and nasopharynx clear.  NECK:  Supple, no jugular venous distention. No thyroid enlargement, no tenderness.  LUNGS: Normal breath sounds bilaterally, no wheezing, rales,rhonchi or crepitation. No use of accessory muscles of respiration.  CARDIOVASCULAR: S1, S2 normal. No murmurs, rubs, or gallops.  ABDOMEN: Soft, nontender, nondistended. Bowel sounds present. No organomegaly or mass.  EXTREMITIES: No pedal edema, cyanosis, or clubbing.  NEUROLOGIC: Cranial nerves II through XII are intact. Muscle strength 5/5 in all extremities. Sensation intact. Gait not checked.  PSYCHIATRIC: The patient is alert and oriented x 3. Flat effect. SKIN: No obvious rash, lesion, or ulcer.    LABORATORY PANEL:   CBC  Recent Labs Lab 11/02/16 0522  WBC 5.2  HGB 10.7*  HCT 33.2*  PLT 260   ------------------------------------------------------------------------------------------------------------------  Chemistries   Recent Labs Lab 11/01/16 0613 11/02/16 0522  NA 143 143  K 2.7* 3.1*  CL 102 107  CO2 31 29  GLUCOSE 155* 136*  BUN <5* <5*  CREATININE 0.56 0.51  CALCIUM 9.1 8.2*  MG 2.1  --   AST 34  --   ALT 17  --   ALKPHOS 86  --   BILITOT 0.5  --    ------------------------------------------------------------------------------------------------------------------  Cardiac Enzymes No results for input(s): TROPONINI in the last 168 hours. ------------------------------------------------------------------------------------------------------------------  RADIOLOGY:  Dg Chest 1 View  Result Date: 11/01/2016 CLINICAL DATA:  Nausea and vomiting beginning this  morning. EXAM: CHEST 1 VIEW COMPARISON:  10/11/2016 FINDINGS: Left pacer remains in place, unchanged. Prior CABG. Low lung volumes. No confluent airspace opacities or effusions. No  acute bony abnormality. Heart is upper limits normal in size. IMPRESSION: Low lung volumes.  No acute findings. Electronically Signed   By: Rolm Baptise M.D.   On: 11/01/2016 08:30   US Abdomen Limited Ruq  Result Date: 11/01/2016 CLINICAL DATA:  Nausea and vomiting for 2 weeks, most severe with fatty food consumption EXAM: US ABDOMEN LIMITED - RIGHT UPPER QUADRANT COMPARISON:  CT abdomen and pelvis October 11, 2016. FINDINGS: Gallbladder: Within the gallbladder, there are multiple small echogenic foci which move and shadow consistent with cholelithiasis. Largest individual gallstone measures 4 mm in length. There is no gallbladder wall thickening or pericholecystic fluid. No sonographic Murphy sign noted by sonographer. Common bile duct: Diameter: 5 mm. There is no intrahepatic or extrahepatic biliary duct dilatation. Liver: No focal lesion identified. Liver echogenicity is overall increased diffusely. IMPRESSION: Cholelithiasis.  Gallbladder otherwise appears unremarkable. Increased liver echogenicity is felt to be consistent with hepatic steatosis. While no focal liver lesions are identified, it must be cautioned that the sensitivity of ultrasound for detection of focal liver lesions is diminished in this circumstance. Electronically Signed   By: Lowella Grip III M.D.   On: 11/01/2016 10:03    EKG:   Orders placed or performed during the hospital encounter of 11/01/16  . EKG 12-Lead  . EKG 12-Lead  . ED EKG  . ED EKG    ASSESSMENT AND PLAN:    #1. Intractable nausea, vomiting, diarrhea likely due to viral gastroenteritis: Symptoms of nausea are still there but the diarrhea, vomiting resolved. Continue PPIs but discontinue IV fluids. Continue nausea medicines.  #2 severe hypokalemia secondary to GI losses: Potassium is improving, continue to replace potassium, magnesium.  #3. lactic acidosis: Likely secondary to GI illness: Resolving the  #4 .depression: Psychiatric consult  requested, discussed this with patient  #5 .morbid obesity and poor by mouth intake likely due to depression.  #6. deconditioning: Physical therapy consulted  #7 essential hypertension, chronic A. fib: Continue Cardizem but that discontinued lovastatin higher dose due to interaction #8 anorexia: Patient is asking for appetite stimulants. But According to  pts RN,t ate pretty well this morning for her breakfast.     All the records are reviewed and case discussed with Care Management/Social Workerr. Management plans discussed with the patient, family and they are in agreement.  CODE STATUS: DO NOT RESUSCITATE  TOTAL TIME TAKING CARE OF THIS PATIENT: 35 minutes.   POSSIBLE D/C IN 1-2DAYS, DEPENDING ON CLINICAL CONDITION.   Epifanio Lesches M.D on 11/02/2016 at 12:09 PM  Between 7am to 6pm - Pager - [phone]  After 6pm go to [email or web address] - password [email or web address] [facility]  Office  [phone]  CC: Primary care physician; [name], MD   Note: This dictation was prepared with Dragon dictation along with smaller phrase technology. Any transcriptional errors that result from this process are unintentional.

## 2016-11-02 NOTE — Clinical Social Work Note (Signed)
MSW received consult for patient needing SNF placement.  MSW to complete assessment at a later time.  Jones Broom. Emanuell Morina, MSW [phone]  Mon-Fri 8a-4:30p 11/02/2016 5:30 PM

## 2016-11-02 NOTE — Progress Notes (Signed)
MEDICATION RELATED CONSULT NOTE - INITIAL   Pharmacy Consult for Electrolyte replacement Indication: hypokalemia/hypomagnesia  Allergies  Allergen Reactions  . Citalopram Other (See Comments)    Reaction:  Altered mental status   . Cymbalta [Duloxetine Hcl] Other (See Comments)    Reaction:  Sedative for pt   . Imipramine Other (See Comments)    Reaction:  Unknown   . Proton Pump Inhibitors Other (See Comments)    Reaction:  Unknown   . Venlafaxine Nausea And Vomiting and Other (See Comments)    Reaction:  Dizziness     Patient Measurements: Height: 5\' 4"  (162.6 cm) Weight: 254 lb (115.2 kg) IBW/kg (Calculated) : 54.7   Vital Signs: Temp: 98.8 F (37.1 C) (11/30 1940) Temp Source: Oral (11/30 1940) BP: 127/58 (11/30 1940) Pulse Rate: 80 (11/30 1940) Intake/Output from previous day: 11/29 0701 - 11/30 0700 In: 2563.8 [P.O.:480; I.V.:1318.8; IV Piggyback:765] Out: -  Intake/Output from this shift: No intake/output data recorded.  Labs:  Recent Labs  11/01/16 0613 11/02/16 0522  WBC 4.9 5.2  HGB 11.7* 10.7*  HCT 36.1 33.2*  PLT 271 260  CREATININE 0.56 0.51  MG 2.1  --   ALBUMIN 3.5  --   PROT 7.6  --   AST 34  --   ALT 17  --   ALKPHOS 86  --   BILITOT 0.5  --    Lab Results  Component Value Date   K 3.2 (L) 11/02/2016   Estimated Creatinine Clearance: 66.4 mL/min (by C-G formula based on SCr of 0.51 mg/dL).  Medications:  Scheduled:  . acidophilus  1 capsule Oral BID  . atorvastatin  80 mg Oral Daily  . diltiazem  120 mg Oral Daily  . enoxaparin (LOVENOX) injection  40 mg Subcutaneous BID  . LORazepam  0.5 mg Intravenous Once  . metoCLOPramide (REGLAN) injection  5 mg Intravenous Q6H  . potassium chloride  40 mEq Oral Once   Infusions:    Assessment: 82 yo F w/ intractable nausea, vomiting, diarrhea for the past 2 days. ?viral gasteroenteritis. Home lasix on hold.  Hx: morbid obesity, severe aortic stenosis, diastolic CHF, Afib, pacemaker,  chronic SOB, DM, GERD   Plan:  11/30 K @ 1850 = 3.2. Will order another KCl 40 mEq PO once at bedtime and recheck labs tomorrow morning.  Lenis Noon, PharmD, BCPS Clinical Pharmacist 11/02/2016,8:22 PM

## 2016-11-03 ENCOUNTER — Inpatient Hospital Stay: Payer: Medicare Other

## 2016-11-03 LAB — MAGNESIUM: Magnesium: 1.8 mg/dL (ref 1.7–2.4)

## 2016-11-03 LAB — BASIC METABOLIC PANEL
Anion gap: 7 (ref 5–15)
BUN: 6 mg/dL (ref 6–20)
CALCIUM: 8.6 mg/dL — AB (ref 8.9–10.3)
CO2: 26 mmol/L (ref 22–32)
Chloride: 110 mmol/L (ref 101–111)
Creatinine, Ser: 0.58 mg/dL (ref 0.44–1.00)
GFR calc Af Amer: >60 mL/min (ref >60)
GLUCOSE: 137 mg/dL — AB (ref 65–99)
Potassium: 3.7 mmol/L (ref 3.5–5.1)
Sodium: 143 mmol/L (ref 135–145)

## 2016-11-03 LAB — GLUCOSE, CAPILLARY
Glucose-Capillary: 128 mg/dL — ABNORMAL HIGH (ref 65–99)
Glucose-Capillary: 126 mg/dL — ABNORMAL HIGH (ref 65–99)

## 2016-11-03 LAB — PHOSPHORUS: Phosphorus: 2.1 mg/dL — ABNORMAL LOW (ref 2.5–4.6)

## 2016-11-03 MED ORDER — HYDROCODONE-ACETAMINOPHEN 10-325 MG PO TABS: 1.0000 | ORAL_TABLET | ORAL | 0 refills | Status: DC | PRN

## 2016-11-03 NOTE — Clinical Social Work Note (Signed)
Clinical Social Work Assessment  Patient Details  Name: Michele Schultz MRN: [id number] Date of Birth: Apr 10, 1931  Date of referral:  11/03/16               Reason for consult:  Facility Placement                Permission sought to share information with:  Facility Sport and exercise psychologist, Family Supports Permission granted to share information::  Yes, Verbal Permission Granted  Name::        Agency::  [facility] placments  Relationship::     Contact Information:     Housing/Transportation Living arrangements for the past 2 months:  [place] of Information:  Patient, Facility, Other (Comment Required) ([name]) Patient Interpreter Needed:  None Criminal Activity/Legal Involvement Pertinent to Current Situation/Hospitalization:  No - Comment as needed Significant Relationships:  Other Family Members Lives with:    Do you feel safe going back to the place where you live?  No (Patient states she does not want to go back) Need for family participation in patient care:  Yes (Comment)  Care giving concerns:  Patient expressed that she does not want to return to [facility] because she feels she needs more care.   Social Worker assessment / plan:  Patient is a 82 year old female who is from [place].  Patient is alert and oriented x3, and expressed that she has been at [facility].  Patient was angry when MSW spoke to her because she expressed she did not want to go back to [facility].  Patient was agitated in bed, MSW spoke to patient and she stated she would rather go somewhere different because they do not take good enough care of her.  Patient stated she wants to go somewhere else, MSW told patient that she wants to go to SNF she would have to pay privately, patient states she can not afford to go to SNF.  MSW explained to her the only other option is to return back to ALF.   Employment status:  Retired Radiation protection practitioner:  Medicare PT Recommendations:  [place] / Referral to community resources:  [place]  Patient/Family's Response to care:  Patient's family are working on trying to get patient to a long term care SNF and applying for Medicaid.  Patient/Family's Understanding of and Emotional Response to Diagnosis, Current Treatment, and Prognosis:  Patient upset stating that she does not think she is ready for discharge yet, and her needs can not be met at ALF.  Patient wants to go somewhere she can get more care.  Emotional Assessment Appearance:  Appears stated age Attitude/Demeanor/Rapport:  Complaining, Screaming Affect (typically observed):  Blunt, Frustrated Orientation:  Oriented to Self, Oriented to Place Alcohol / Substance use:  Not Applicable Psych involvement (Current and /or in the community):  No (Comment)  Discharge Needs  Concerns to be addressed:  No discharge needs identified Readmission within the last 30 days:  Yes (10-21-16) Current discharge risk:  Chronically ill Barriers to Discharge:  Other (Patient saying she does not want to return to [facility] now.)   Ross Ludwig 11/03/2016, 4:57 PM

## 2016-11-03 NOTE — Care Management (Addendum)
Attending has  not discontinued the discharge order.  Have paged attending to discuss. Confirmed that primary nurse did notify attending of chest xray results and at time of this note, there are no new orders.   Had patient to sign the Detailed Notice of Discharge and IM Notice and copies are in the chart.  Left VM for Joella Prince regarding notices and no order to discontinue the discharge order.  If discharge order is not discontinued-would need to proceed with the appeal.  If attending discontinues the discharge order- would not have to proceed with appeal per Deveron Furlong.    Patient again says that she is not going to leave the hospital if it means going back to assisted living facility.  "Just put me out on the street."

## 2016-11-03 NOTE — NC FL2 (Addendum)
[place] LEVEL OF CARE SCREENING TOOL     IDENTIFICATION  Patient Name: Michele Schultz Birthdate: April 01, 1931 Sex: female Admission Date (Current Location): 11/01/2016  [place] and [place] Number:  Engineering geologist and Address:  [facility], [address] [place], [place] [address]      Provider Number: [id number]  Attending Physician Name and Address:  Epifanio Lesches, MD  Relative Name and Phone Number:  Delrae Sawyers [phone] or Knight,Shelia Niece [phone]  [phone]    Current Level of Care: Hospital Recommended Level of Care: [place] Prior Approval Number:    Date Approved/Denied:   PASRR Number:    Discharge Plan: ALF    Current Diagnoses: Patient Active Problem List   Diagnosis Date Noted  . Adjustment disorder with depressed mood 11/02/2016  . Nausea & vomiting 11/01/2016  . Nausea and vomiting   . Atrial fibrillation with RVR ([place]) 10/11/2016  . Hypokalemia 10/11/2016  . DNR (do not resuscitate) 09/19/2016  . Palliative care by specialist 09/19/2016  . Muscle weakness (generalized)   . Atrial tachycardia ([place])   . Diarrhea 09/17/2016  . Dehydration 09/17/2016  . Metabolic acidosis [id number]  . Headache due to trauma 07/03/2016  . Hypertensive heart disease   . Wide-complex tachycardia ([place])   . Pain in the chest   . Emesis   . Weakness of both legs   . Coronary artery disease involving coronary bypass graft of native heart with unspecified angina pectoris   . S/P AVR (aortic valve replacement)   . Falls frequently   . HLD (hyperlipidemia)   . Fibromyalgia   . RAD (reactive airway disease)   . Morbid obesity ([place])   . Aortic stenosis, severe   . Chronic pain 06/17/2014  . Pain in the muscles 06/19/2013  . Chest pain 06/19/2013  . Wheezing 06/19/2013  . Weakness generalized 06/03/2013  . Dyspnea 01/27/2013  . Nausea 03/05/2012  . Constipation 03/05/2012   . Atrial fibrillation-non sustained 02/09/2012  . Cardiac pacemaker -st Judes   . Chronic diastolic heart failure ([place])   . Coronary artery disease   . Long term (current) use of anticoagulants 01/03/2012  . S/P CABG x 2 11/14/2011  . S/P aortic valve replacement with porcine valve 11/14/2011  . Back pain 11/14/2011  . Complete heart block ([place]) 10/20/2011  . HTN (hypertension) 09/15/2011  . Spinal stenosis 03/22/2011  . Fatigue 03/22/2011  . Hyperlipidemia 03/22/2011    Orientation RESPIRATION BLADDER Height & Weight     Self  Normal Incontinent Weight: 249 lb (112.9 kg) Height:  5\' 4"  (162.6 cm)  BEHAVIORAL SYMPTOMS/MOOD NEUROLOGICAL BOWEL NUTRITION STATUS      Incontinent Diet (Cardiac)  AMBULATORY STATUS COMMUNICATION OF NEEDS Skin     Verbally Normal                       Personal Care Assistance Level of Assistance  Bathing, Feeding, Dressing     Dressing Assistance: Limited assistance     Functional Limitations Info  Sight, Hearing, Speech Sight Info: Adequate Hearing Info: Adequate Speech Info: Adequate    SPECIAL CARE FACTORS FREQUENCY  PT (By licensed PT)     PT Frequency: 2x a Week              Contractures Contractures Info: Not present    Additional Factors Info  Code Status, Allergies Code Status Info: Full Code Allergies Info: CITALOPRAM, CYMBALTA DULOXETINE HCL, IMIPRAMINE, PROTON  PUMP INHIBITORS, VENLAFAXINE           Current Medications (11/03/2016):  This is the current hospital active medication list Current Facility-Administered Medications  Medication Dose Route Frequency Provider Last Rate Last Dose  . acetaminophen (TYLENOL) tablet 650 mg  650 mg Oral Q6H PRN Epifanio Lesches, MD   650 mg at 11/02/16 0655   Or  . acetaminophen (TYLENOL) suppository 650 mg  650 mg Rectal Q6H PRN Epifanio Lesches, MD      . acidophilus (RISAQUAD) capsule 1 capsule  1 capsule Oral BID Epifanio Lesches, MD   1 capsule at 11/03/16  0946  . atorvastatin (LIPITOR) tablet 80 mg  80 mg Oral Daily Epifanio Lesches, MD   80 mg at 11/03/16 0946  . diltiazem (CARDIZEM CD) 24 hr capsule 120 mg  120 mg Oral Daily Epifanio Lesches, MD   120 mg at 11/03/16 0947  . enoxaparin (LOVENOX) injection 40 mg  40 mg Subcutaneous BID Epifanio Lesches, MD   40 mg at 11/03/16 0947  . hydrALAZINE (APRESOLINE) injection 5 mg  5 mg Intravenous Q4H PRN Epifanio Lesches, MD      . LORazepam (ATIVAN) injection 0.5 mg  0.5 mg Intravenous Once Earleen Newport, MD      . metoCLOPramide (REGLAN) injection 5 mg  5 mg Intravenous Q6H Epifanio Lesches, MD   5 mg at 11/03/16 0531  . ondansetron (ZOFRAN) tablet 4 mg  4 mg Oral Q6H PRN Epifanio Lesches, MD       Or  . ondansetron (ZOFRAN) injection 4 mg  4 mg Intravenous Q6H PRN Epifanio Lesches, MD   4 mg at 11/01/16 2138     Discharge Medications: Please see discharge summary for a list of discharge medications.  Relevant Imaging Results:  Relevant Lab Results:   Additional Information SSN [id number]   Ross Ludwig

## 2016-11-03 NOTE — Discharge Summary (Signed)
Michele Schultz, is a 82 y.o. female  DOB 22-Apr-1931  MRN [id number].  Admission date:  11/01/2016  Admitting Physician  Epifanio Lesches, MD  Discharge Date:  11/03/2016   Primary MD  [name], MD  Recommendations for primary care physician for things to follow:  Follow-up with primary doctor in  2 weeks   Admission Diagnosis  Hypokalemia [E87.6] Lactic acidosis [E87.2] Generalized abdominal pain [R10.84] Nausea & vomiting [R11.2] Nausea vomiting and diarrhea [R11.2, R19.7] Abdominal pain [R10.9]   Discharge Diagnosis  Hypokalemia [E87.6] Lactic acidosis [E87.2] Generalized abdominal pain [R10.84] Nausea & vomiting [R11.2] Nausea vomiting and diarrhea [R11.2, R19.7] Abdominal pain [R10.9]   Principal Problem:   Adjustment disorder with depressed mood Active Problems:   Nausea & vomiting      Past Medical History:  Diagnosis Date  . Anemia   . Aortic stenosis, severe    a. s/p Magna Ease pericardial tissue valve size 21 mm replacement in 10/2011 for severe AS 11/12; b. echo 07/2015: EF 55-60% mod concentric LVH, GR1DD, LA mildly dilated, PASP 45 mm Hg  . Atrial tachycardia, paroxysmal (HCC)    a. with rate related LBBB.  . Cardiac pacemaker -st Judes    11/12  . Chronic diastolic heart failure ([place])    a. echo 2014: EF 55-60%, no RWMA, GR1DD, PASP 47 mm Hg; b. echo 07/2015: EF 55-60% mod concentric LVH, GR1DD, LA mildly dilated, PASP 45 mm Hg  . Complete heart block [facility])    a. s/p St Jude PPM 10/2011 (Ser # [id number]).  . Coronary artery disease    a. s/p 2v CABG 11/12 (VG-LAD, VG-OM1); b. Lexiscan 08/2015: low risk, no ischemia, EF 55-65%.  . Enthesopathy of hip region   . Esophageal reflux    followed by Dr.Seigal. stabilized with a combination of Nexium and Zantac  . Fibromyalgia   . HLD  (hyperlipidemia)   . Hypertensive heart disease   . Knee joint replacement by other means   . Morbid obesity ([place])   . Neuralgia, neuritis, and radiculitis, unspecified   . Neuropathy ([place])   . Onychia and paronychia of toe   . Osteoarthrosis, unspecified whether generalized or localized, pelvic region and thigh    mainly in her back and knees  . PAF (paroxysmal atrial fibrillation) ([place])    a. brief episodes of AF previously noted on device interrogations.  Marland Kitchen RAD (reactive airway disease)    a. chronic SOB  . Spinal stenosis   . Stress incontinence, female    followed by Dr.Cope  . Type II or unspecified type diabetes mellitus without mention of complication, not stated as uncontrolled    a. pt. reports that she is borderline   . Wide-complex tachycardia ([place])    a. Noted 4/10 - brief episode in ED. Noted again 4/21 in clinic->felt most likely to be atrial tach.    Past Surgical History:  Procedure Laterality Date  . AORTIC VALVE REPLACEMENT  10/17/2011   Procedure: AORTIC VALVE REPLACEMENT (AVR);  Surgeon: Melrose Nakayama, MD;  Location: [place];  Service: Open Heart Surgery;  Laterality: N/A;  . CARDIAC CATHETERIZATION  08/2009   50% stenosis distal left main, 50% stenosis ostial left circumflex.   Marland Kitchen CARDIAC CATHETERIZATION     at [facility]  . CORONARY ARTERY BYPASS GRAFT  10/17/2011   Procedure: CORONARY ARTERY BYPASS GRAFTING (CABG);  Surgeon: Melrose Nakayama, MD;  Location: [place];  Service: Open Heart Surgery;  Laterality: N/A;  Times  Two, using right leg greater saphenous vein harvested endoscopically  . ESOPHAGOGASTRODUODENOSCOPY (EGD) WITH PROPOFOL N/A 10/12/2016   Procedure: ESOPHAGOGASTRODUODENOSCOPY (EGD) WITH PROPOFOL;  Surgeon: Lucilla Lame, MD;  Location: [facility] ENDOSCOPY;  Service: Endoscopy;  Laterality: N/A;  . EYE SURGERY     IOL/ after cataracts removed- bilateral   . NASAL SINUS SURGERY  2009  . PERMANENT PACEMAKER INSERTION N/A 10/20/2011   Procedure:  PERMANENT PACEMAKER INSERTION;  Surgeon: Thompson Grayer, MD;  Location: [facility] CATH LAB;  Service: Cardiovascular;  Laterality: N/A;  . REPLACEMENT TOTAL KNEE  11/2006   right knee  . TOTAL HIP ARTHROPLASTY  09/2010       History of present illness and  Hospital Course:     Kindly see H&P for history of present illness and admission details, please review complete Labs, Consult reports and Test reports for all details in brief  HPI  from the history and physical done on the day of admission  82 year old female patient with severe aortic stenosis, chronic diastolic heart failure, morbid obesity, paroxysmal atrial fibrillation comes in because of intractable nausea, vomiting, diarrhea for 2 days. Admitted for a this, severe hypokalemia.  Hospital Course   #1 intractable nausea, vomiting, diarrhea likely due to acute viral gastroenteritis: Patient vomiting, diarrhea resolved. But nausea persisted. Continued on PPIs. No abdominal pain. Abdominal ultrasound did not show any cholecystitis. Patient does have gallstones. Lactic acidosis: Due to GI illness: Resolving. #2 severe hypokalemia secondary to GI losses: Replaced it. #3 chronic diastolic heart failure: EF 60% by echo previously.  #4 morbid obesity;  History of atrial tachycardia,  S/p  Pacemaker; is on Cardizem: Continue that.  Hyperlipidemia; continue statins  Fibromyalgia, chronic pain syndrome: Patient takes Percocet at home continue that and we'll discontinue tramadol due to advanced age. And possible confusion,  GERD  Chronic body pains, osteoarthrosis: Severe aortic stenosis  CODE STATUS DO NOT RESUSCITATE  Deconditioning: Physical therapy recommended skilled nursing. Patient stable for discharge today.     Discharge Condition: stable   Follow UP      Discharge Instructions  and  Discharge Medications        Medication List    STOP taking these medications   magnesium hydroxide 400 MG/5ML  suspension Commonly known as:  MILK OF MAGNESIA   ondansetron 4 MG disintegrating tablet Commonly known as:  ZOFRAN-ODT   traMADol 50 MG tablet Commonly known as:  ULTRAM     TAKE these medications   acetaminophen 325 MG tablet Commonly known as:  TYLENOL Take 650 mg by mouth 3 (three) times daily as needed.   acidophilus Caps capsule Take 1 capsule by mouth 2 (two) times daily.   aspirin EC 81 MG tablet Take 81 mg by mouth daily.   atorvastatin 80 MG tablet Commonly known as:  LIPITOR Take 80 mg by mouth daily.   Calcium Carbonate-Vitamin D 600-400 MG-UNIT tablet Take 1 tablet by mouth daily.   Cholecalciferol 1000 units capsule Take 1,000 Units by mouth daily.   diltiazem 120 MG 24 hr capsule Commonly known as:  CARDIZEM CD Take 1 capsule (120 mg total) by mouth daily.   furosemide 20 MG tablet Commonly known as:  LASIX Take 1 tablet (20 mg total) by mouth every other day.   HYDROcodone-acetaminophen 10-325 MG tablet Commonly known as:  NORCO Take 1 tablet by mouth every 4 (four) hours as needed for moderate pain. Reported on 05/30/2016   metoCLOPramide 5 MG tablet Commonly known as:  REGLAN Take 1  tablet (5 mg total) by mouth every 6 (six) hours as needed for nausea or vomiting.   nitroGLYCERIN 0.4 MG SL tablet Commonly known as:  NITROSTAT Place 1 tablet (0.4 mg total) under the tongue every 5 (five) minutes as needed for chest pain.   promethazine 12.5 MG tablet Commonly known as:  PHENERGAN Take 1 tablet (12.5 mg total) by mouth every 6 (six) hours as needed for nausea or vomiting.         Diet and Activity recommendation: See Discharge Instructions above   Consults obtained ;PT, psychiatriy   Major procedures and Radiology Reports - PLEASE review detailed and final reports for all details, in brief -     Dg Chest 1 View  Result Date: 11/01/2016 CLINICAL DATA:  Nausea and vomiting beginning this morning. EXAM: CHEST 1 VIEW COMPARISON:   10/11/2016 FINDINGS: Left pacer remains in place, unchanged. Prior CABG. Low lung volumes. No confluent airspace opacities or effusions. No acute bony abnormality. Heart is upper limits normal in size. IMPRESSION: Low lung volumes.  No acute findings. Electronically Signed   By: Rolm Baptise M.D.   On: 11/01/2016 08:30   Dg Chest 2 View  Result Date: 10/11/2016 CLINICAL DATA:  Nausea and weakness EXAM: CHEST  2 VIEW COMPARISON:  10/07/2016 FINDINGS: Left-sided dual lead pacing device with leads over the right atrium and right ventricle. Post sternotomy changes. Possible tiny right pleural effusion. Stable enlarged cardiomediastinal none with tortuous aorta. Hazy bibasilar opacity likely attributable to overlying soft tissues. No focal consolidation. No pneumothorax. IMPRESSION: 1. Stable degree of cardiomegaly.  No overt failure. 2. Possible tiny right pleural effusion 3. No focal consolidation Electronically Signed   By: Donavan Foil M.D.   On: 10/11/2016 21:43   Nm Hepatobiliary Liver Func  Result Date: 10/14/2016 CLINICAL DATA:  Patient with history of nausea. EXAM: NUCLEAR MEDICINE HEPATOBILIARY IMAGING TECHNIQUE: Sequential images of the abdomen were obtained out to 60 minutes following intravenous administration of radiopharmaceutical. RADIOPHARMACEUTICALS:  5.832 mCi Tc-98m  Choletec IV COMPARISON:  CT abdomen pelvis 10/11/2016 FINDINGS: Prompt uptake and biliary excretion of activity by the liver is seen. Gallbladder activity is visualized, consistent with patency of cystic duct. Biliary activity passes into small bowel, consistent with patent common bile duct. IMPRESSION: No evidence for cystic duct obstruction. Electronically Signed   By: Lovey Newcomer M.D.   On: 10/14/2016 09:48   Ct Abdomen Pelvis W Contrast  Result Date: 10/11/2016 CLINICAL DATA:  Epigastric pain nausea and weakness EXAM: CT ABDOMEN AND PELVIS WITH CONTRAST TECHNIQUE: Multidetector CT imaging of the abdomen and pelvis was  performed using the standard protocol following bolus administration of intravenous contrast. CONTRAST:  1108mL ISOVUE-300 IOPAMIDOL (ISOVUE-300) INJECTION 61% COMPARISON:  09/17/2016 FINDINGS: Lower chest: Hazy density within the lingula could reflect atelectasis or a mild infiltrate. No effusion. Mild subpleural opacities at the right middle lobe are unchanged, possible mild fibrosis. Heart is enlarged. Partially visualized pacer leads. Calcified mitral annulus. Hepatobiliary: Diffuse decreased density of the liver, consistent with fatty infiltration. Liver is slightly enlarged at 17 cm craniocaudad. No focal hepatic abnormalities. Small amount of high density material is present within the gallbladder, which may reflect small stones or sludge. No biliary dilatation. Pancreas: Unremarkable. No pancreatic ductal dilatation or surrounding inflammatory changes.Choose Spleen: Normal in size without focal abnormality.Small accessory splenule. Adrenals/Urinary Tract: Adrenal glands within normal limits. Punctate nonobstructing stones within the kidneys. Cortical hypodense lesion within the left kidney too small to further characterize, grossly unchanged. Small amount  of air within the bladder. Stomach/Bowel: The stomach is nondilated and contains possible small pill fragment. Duodenum lipoma unchanged. No dilated small bowel to suggest an obstruction. Mild colon diverticular disease without bowel inflammation. Transverse lie of the cecum. Appendix is normal. Vascular/Lymphatic: Atherosclerotic vascular calcifications but no aneurysm. No significantly enlarged lymph nodes. Reproductive: Uterus and bilateral adnexa are unremarkable. Other: No free air or free fluid Musculoskeletal: Patient is status post right hip replacement. Multilevel degenerative changes of the spine with mild anterior listhesis of L4 on L5 and mild retrolisthesis of L5 on S1. Vacuum discs at L4-L5 and L5-S1. No acute osseous abnormality. IMPRESSION:  1. Hazy density in the lingula could reflect atelectasis or mild infiltrate. 2. Slightly enlarged fatty liver. 3. Small amount of stones or high density sludge within the gallbladder. No wall thickening. No biliary dilatation. 4. Punctate nonobstructing stones within both kidneys. 5. No evidence for small bowel obstruction. 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 Normal appendix. Electronically Signed   By: Donavan Foil M.D.   On: 10/11/2016 23:22   Dg Chest Portable 1 View  Result Date: 10/07/2016 CLINICAL DATA:  Nausea.  Colitis.  Fatigue. EXAM: PORTABLE CHEST 1 VIEW COMPARISON:  03/13/2016 FINDINGS: Pacer with leads at right atrium and right ventricle. No lead discontinuity. The Chin overlies the apices. Mildly degraded exam due to AP portable technique and patient body habitus. Midline trachea. Mild cardiomegaly. Tortuous thoracic aorta. Prior median sternotomy. No pleural effusion or pneumothorax. Clear lungs. IMPRESSION: No acute cardiopulmonary disease. Cardiomegaly without congestive failure. Electronically Signed   By: Abigail Miyamoto M.D.   On: 10/07/2016 21:10   US Abdomen Limited Ruq  Result Date: 11/01/2016 CLINICAL DATA:  Nausea and vomiting for 2 weeks, most severe with fatty food consumption EXAM: US ABDOMEN LIMITED - RIGHT UPPER QUADRANT COMPARISON:  CT abdomen and pelvis October 11, 2016. FINDINGS: Gallbladder: Within the gallbladder, there are multiple small echogenic foci which move and shadow consistent with cholelithiasis. Largest individual gallstone measures 4 mm in length. There is no gallbladder wall thickening or pericholecystic fluid. No sonographic Murphy sign noted by sonographer. Common bile duct: Diameter: 5 mm. There is no intrahepatic or extrahepatic biliary duct dilatation. Liver: No focal lesion identified. Liver echogenicity is overall increased diffusely. IMPRESSION: Cholelithiasis.  Gallbladder otherwise appears unremarkable. Increased liver echogenicity is felt to be consistent with  hepatic steatosis. While no focal liver lesions are identified, it must be cautioned that the sensitivity of ultrasound for detection of focal liver lesions is diminished in this circumstance. Electronically Signed   By: Lowella Grip III M.D.   On: 11/01/2016 10:03    Micro Results     Recent Results (from the past 240 hour(s))  C difficile quick scan w PCR reflex     Status: None   Collection Time: 11/01/16  5:44 AM  Result Value Ref Range Status   C Diff antigen NEGATIVE NEGATIVE Final   C Diff toxin NEGATIVE NEGATIVE Final   C Diff interpretation No C. difficile detected.  Final       Today   Subjective:   Michele Schultz today has no headache,no chest abdominal pain,no new weakness tingling or numbness, feels much better wants to go home today.   Objective:   Blood pressure (!) 140/48, pulse 83, temperature 98.9 F (37.2 C), temperature source Oral, resp. rate 18, height 5\' 4"  (1.626 m), weight 112.9 kg (249 lb), SpO2 93 %.   Intake/Output Summary (Last 24 hours) at 11/03/16 0913 Last data filed at  11/02/16 2300  Gross per 24 hour  Intake              100 ml  Output                0 ml  Net              100 ml    Exam Awake Alert, Oriented x 3, No new F.N deficits, Normal affect [place].AT,PERRAL Supple Neck,No JVD, No cervical lymphadenopathy appriciated.  Symmetrical Chest wall movement, Good air movement bilaterally, CTAB RRR,No Gallops,Rubs or new Murmurs, No Parasternal Heave +ve B.Sounds, Abd Soft, Non tender, No organomegaly appriciated, No rebound -guarding or rigidity. No Cyanosis, Clubbing or edema, No new Rash or bruise  Data Review   CBC w Diff: Lab Results  Component Value Date   WBC 5.2 11/02/2016   HGB 10.7 (L) 11/02/2016   HGB 11.9 (L) 12/04/2014   HCT 33.2 (L) 11/02/2016   HCT 36.4 08/25/2015   PLT 260 11/02/2016   PLT 304 08/25/2015   LYMPHOPCT 24 10/11/2016   LYMPHOPCT 18.0 10/16/2014   MONOPCT 12 10/11/2016   MONOPCT 7 10/21/2014    MONOPCT 8.7 10/16/2014   EOSPCT 3 10/11/2016   EOSPCT 1.0 10/16/2014   BASOPCT 1 10/11/2016   BASOPCT 0.6 10/16/2014    CMP: Lab Results  Component Value Date   NA 143 11/03/2016   NA 141 03/24/2016   NA 140 12/04/2014   K 3.7 11/03/2016   K 3.3 (L) 12/04/2014   CL 110 11/03/2016   CL 104 12/04/2014   CO2 26 11/03/2016   CO2 29 12/04/2014   BUN 6 11/03/2016   BUN 16 03/24/2016   BUN 8 12/04/2014   CREATININE 0.58 11/03/2016   CREATININE 0.82 12/04/2014   PROT 7.6 11/01/2016   PROT 7.9 12/04/2014   ALBUMIN 3.5 11/01/2016   ALBUMIN 3.5 12/04/2014   BILITOT 0.5 11/01/2016   BILITOT 0.8 12/04/2014   ALKPHOS 86 11/01/2016   ALKPHOS 92 12/04/2014   AST 34 11/01/2016   AST 26 12/04/2014   ALT 17 11/01/2016   ALT 18 12/04/2014  .   Total Time in preparing paper work, data evaluation and todays exam - 52 minutes  Ashely Goosby M.D on 11/03/2016 at 9:13 AM    Note: This dictation was prepared with Dragon dictation along with smaller phrase technology. Any transcriptional errors that result from this process are unintentional.

## 2016-11-03 NOTE — Care Management (Addendum)
Patient informed CM she was not going to leave because she does not want to go back to [facility]. Discussed that CM agrees she needs a higher level of care but acute level of care is not the option to meet care needs while making arrangements for long term care.  Patient wishes to appeal her discharge because she does not want to go back to [facility].  Assisted her with initiating call to [facility].  Notified attending  and CM director.  Case ID [id number] NA.  While to call for the appeal was in progress received results of cxr which showed possible pneumonia.

## 2016-11-03 NOTE — Progress Notes (Signed)
Hypoxia on room air: Low-grade temperature this morning. Check x-ray of the chest, if it's normal discharge the patient.

## 2016-11-03 NOTE — Care Management Important Message (Signed)
Important Message  Patient Details  Name: SABRYN LAFORTE MRN: [id number] Date of Birth: 17-Jan-1931   Medicare Important Message Given:  Yes    Katrina Stack, RN 11/03/2016, 2:25 PM

## 2016-11-03 NOTE — Care Management (Signed)
It is documented that patient has had a low grade temp this day of 99.3.  Attending has ordered a chest xray.  CM suggested a u/a.  Patient verbalizes that she does not want to go back to [facility] because "my needs can't be met."  Per CSW, family is working on long term care options in a skilled facility and that facility has additional services for the patient

## 2016-11-03 NOTE — Clinical Social Work Note (Signed)
MSW spoke to [facility] who are going to accept patient back, however patient is expressing she does not want to go back to [facility], patient expresses that she can not afford to go to SNF under private pay.  MSW spoke to patient's niece Hughie Closs [phone] who said patient can not afford to pay privately for nursing home placement.  Patient is appealing the discharge per case manager.  MSW to continue to follow patient's progress throughout discharge planning.  Jones Broom. Norval Morton, MSW [phone]  Mon-Fri 8a-4:30p 11/03/2016 4:49 PM

## 2016-11-04 LAB — GLUCOSE, CAPILLARY: GLUCOSE-CAPILLARY: 130 mg/dL — AB (ref 65–99)

## 2016-11-04 MED ORDER — LEVOFLOXACIN 500 MG PO TABS
500.0000 mg | ORAL_TABLET | Freq: Every day | ORAL | Status: DC
  Administered 2016-11-04 – 2016-11-06 (×2): 500 mg via ORAL
  Filled 2016-11-04 (×3): qty 1

## 2016-11-04 NOTE — Progress Notes (Signed)
Pharmacy Antibiotic Note  Michele Schultz is a 82 y.o. female admitted on 11/01/2016 with pneumonia.  Pharmacy has been consulted for levofloxacin dosing.  Plan: Begin levofloxacin 500mg  PO daily x 7 doses.  Height: 5\' 4"  (162.6 cm) Weight: 248 lb 3.2 oz (112.6 kg) IBW/kg (Calculated) : 54.7  Temp (24hrs), Avg:98.8 F (37.1 C), Min:98.2 F (36.8 C), Max:99.3 F (37.4 C)   Recent Labs Lab 11/01/16 0613 11/01/16 1003 11/02/16 0522 11/03/16 0328  WBC 4.9  --  5.2  --   CREATININE 0.56  --  0.51 0.58  LATICACIDVEN 2.7* 2.4*  --   --     Estimated Creatinine Clearance: 65.5 mL/min (by C-G formula based on SCr of 0.58 mg/dL).    Allergies  Allergen Reactions  . Citalopram Other (See Comments)    Reaction:  Altered mental status   . Cymbalta [Duloxetine Hcl] Other (See Comments)    Reaction:  Sedative for pt   . Imipramine Other (See Comments)    Reaction:  Unknown   . Proton Pump Inhibitors Other (See Comments)    Reaction:  Unknown   . Venlafaxine Nausea And Vomiting and Other (See Comments)    Reaction:  Dizziness     Antimicrobials this admission: Levofloxacin 12/2 >>    Dose adjustments this admission: N/A  Microbiology results: 11/29  C Diff Negative  Thank you for allowing pharmacy to be a part of this patient's care.  Olivia Canter, RPh Clinical Pharmacist 11/04/2016 8:16 AM

## 2016-11-04 NOTE — Progress Notes (Signed)
MEDICATION RELATED CONSULT NOTE - Follow Up Pharmacy Consult for Electrolyte replacement Indication: hypokalemia/hypomagnesia  Allergies  Allergen Reactions  . Citalopram Other (See Comments)    Reaction:  Altered mental status   . Cymbalta [Duloxetine Hcl] Other (See Comments)    Reaction:  Sedative for pt   . Imipramine Other (See Comments)    Reaction:  Unknown   . Proton Pump Inhibitors Other (See Comments)    Reaction:  Unknown   . Venlafaxine Nausea And Vomiting and Other (See Comments)    Reaction:  Dizziness     Patient Measurements: Height: 5\' 4"  (162.6 cm) Weight: 248 lb 3.2 oz (112.6 kg) IBW/kg (Calculated) : 54.7   Vital Signs: Temp: 98.9 F (37.2 C) (12/02 0352) Temp Source: Oral (12/02 0352) BP: 138/62 (12/02 0352) Pulse Rate: 92 (12/02 0352) Intake/Output from previous day: 12/01 0701 - 12/02 0700 In: 480 [P.O.:480] Out: 0  Intake/Output from this shift: No intake/output data recorded.  Labs:  Recent Labs  11/02/16 0522 11/03/16 0328  WBC 5.2  --   HGB 10.7*  --   HCT 33.2*  --   PLT 260  --   CREATININE 0.51 0.58  MG  --  1.8  PHOS  --  2.1*   Lab Results  Component Value Date   K 3.7 11/03/2016   Estimated Creatinine Clearance: 65.5 mL/min (by C-G formula based on SCr of 0.58 mg/dL).  Medications:  Scheduled:  . acidophilus  1 capsule Oral BID  . atorvastatin  80 mg Oral Daily  . diltiazem  120 mg Oral Daily  . enoxaparin (LOVENOX) injection  40 mg Subcutaneous BID  . LORazepam  0.5 mg Intravenous Once   Infusions:    Assessment: 82 yo F w/ intractable nausea, vomiting, diarrhea for the past 2 days. ?viral gasteroenteritis. Home lasix on hold.  Hx: morbid obesity, severe aortic stenosis, diastolic CHF, Afib, pacemaker, chronic SOB, DM, GERD  12/1:  K = 3.7, Mag = 1.8  Plan:  Will recheck labs tomorrow morning.  [place] Clinical Pharmacist 11/04/2016,7:42 AM

## 2016-11-04 NOTE — Care Management Note (Signed)
Case Management Note  Patient Details  Name: Michele Schultz MRN: [id number] Date of Birth: May 14, 1931  Subjective/Objective:    Discussed discharge planning with Dr Tressia Miners. Discharge order discontinued. Joella Prince at [facility] notified.                 Action/Plan:   Expected Discharge Date:                  Expected Discharge Plan:     In-House Referral:     Discharge planning Services     Post Acute Care Choice:    Choice offered to:     DME Arranged:    DME Agency:     HH Arranged:    HH Agency:     Status of Service:     If discussed at [employer] of Stay Meetings, dates discussed:    Additional Comments:  Nova Evett A, RN 11/04/2016, 8:47 AM

## 2016-11-04 NOTE — Progress Notes (Signed)
[place] at [place] NAME: Michele Schultz    MR#:  [id number]  DATE OF BIRTH:  1931-07-25  SUBJECTIVE:  CHIEF COMPLAINT:   Chief Complaint  Patient presents with  . Emesis   - Recurrent admissions for nausea and vomiting. Appealed her discharge yesterday. -Complains of significant weakness. Physical therapy consult is pending.  REVIEW OF SYSTEMS:  Review of Systems  Constitutional: Positive for malaise/fatigue. Negative for chills and fever.  HENT: Negative for ear discharge, ear pain, hearing loss, nosebleeds and sore throat.   Eyes: Negative for blurred vision and double vision.  Respiratory: Negative for cough, shortness of breath and wheezing.   Cardiovascular: Negative for chest pain, palpitations and leg swelling.  Gastrointestinal: Positive for nausea. Negative for abdominal pain, constipation, diarrhea and vomiting.  Genitourinary: Negative for dysuria.  Musculoskeletal: Negative for myalgias.  Neurological: Negative for dizziness, speech change, focal weakness, seizures and headaches.  Psychiatric/Behavioral: Negative for depression.    DRUG ALLERGIES:   Allergies  Allergen Reactions  . Citalopram Other (See Comments)    Reaction:  Altered mental status   . Cymbalta [Duloxetine Hcl] Other (See Comments)    Reaction:  Sedative for pt   . Imipramine Other (See Comments)    Reaction:  Unknown   . Proton Pump Inhibitors Other (See Comments)    Reaction:  Unknown   . Venlafaxine Nausea And Vomiting and Other (See Comments)    Reaction:  Dizziness     VITALS:  Blood pressure (!) 158/72, pulse 97, temperature 98.4 F (36.9 C), temperature source Oral, resp. rate 18, height 5\' 4"  (1.626 m), weight 112.6 kg (248 lb 3.2 oz), SpO2 100 %.  PHYSICAL EXAMINATION:  Physical Exam  GENERAL:  82 y.o.-year-old Obese patient lying in the bed with no acute distress.  EYES: Pupils equal, round, reactive to light and accommodation.  No scleral icterus. Extraocular muscles intact.  HEENT: Head atraumatic, normocephalic. Oropharynx and nasopharynx clear.  NECK:  Supple, no jugular venous distention. No thyroid enlargement, no tenderness.  LUNGS: Normal breath sounds bilaterally, no wheezing, rales,rhonchi or crepitation. No use of accessory muscles of respiration. Decreased bibasilar breath sounds. CARDIOVASCULAR: S1, S2 normal. No murmurs, rubs, or gallops.  ABDOMEN: Soft, nontender, nondistended. Bowel sounds present. No organomegaly or mass.  EXTREMITIES: No pedal edema, cyanosis, or clubbing.  NEUROLOGIC: Cranial nerves II through XII are intact. Muscle strength 5/5 in all extremities. Sensation intact. Gait not checked. Generalized weakness present. PSYCHIATRIC: The patient is alert and oriented x 3.  SKIN: No obvious rash, lesion, or ulcer.    LABORATORY PANEL:   CBC  Recent Labs Lab 11/02/16 0522  WBC 5.2  HGB 10.7*  HCT 33.2*  PLT 260   ------------------------------------------------------------------------------------------------------------------  Chemistries   Recent Labs Lab 11/01/16 0613  11/03/16 0328  NA 143  < > 143  K 2.7*  < > 3.7  CL 102  < > 110  CO2 31  < > 26  GLUCOSE 155*  < > 137*  BUN <5*  < > 6  CREATININE 0.56  < > 0.58  CALCIUM 9.1  < > 8.6*  MG 2.1  --  1.8  AST 34  --   --   ALT 17  --   --   ALKPHOS 86  --   --   BILITOT 0.5  --   --   < > = values in this interval not displayed. ------------------------------------------------------------------------------------------------------------------  Cardiac Enzymes No results  for input(s): TROPONINI in the last 168 hours. ------------------------------------------------------------------------------------------------------------------  RADIOLOGY:  Dg Chest 1 View  Result Date: 11/03/2016 CLINICAL DATA:  Short of breath EXAM: CHEST 1 VIEW COMPARISON:  11/01/2016 FINDINGS: CABG.  Dual lead pacemaker unchanged.  COPD  Patchy airspace disease in left lung base has progressed and may represent pneumonia. No effusion. IMPRESSION: Left lower lobe airspace disease, possible pneumonia Electronically Signed   By: Franchot Gallo M.D.   On: 11/03/2016 16:27    EKG:   Orders placed or performed during the hospital encounter of 11/01/16  . EKG 12-Lead  . EKG 12-Lead  . ED EKG  . ED EKG    ASSESSMENT AND PLAN:   82 year old female with past medical history significant for severe aortic stenosis, chronic diastolic heart failure, morbid obesity, seasonal atrial fibrillation who had several admissions for nausea vomiting comes back again with same complaints.  #1 chronic nausea and vomiting-noncompliant with low fat diet. -No abdominal pain. Prior workup negative. The EGD was normal. -Hasn't thrown up in the hospital and has been eating fine. -We'll continue when necessary Zofran.  #2 left lower lobe pneumonia on x-ray-encourage incentive spirometry. Start Levaquin. -No fevers.  #3 paroxysmal atrial fibrillation-rate controlled, on Cardizem. Not an anticoagulation.  #4 IHyperlipidemia-continue statin  #5 DVT prophylaxis-on Lovenox  appealed her discharge yesterday. Awaiting social worker input for rehabilitation placement. -Physical therapy consulted   All the records are reviewed and case discussed with Care Management/Social Workerr. Management plans discussed with the patient, family and they are in agreement.  CODE STATUS: DO NOT RESUSCITATE  TOTAL TIME TAKING CARE OF THIS PATIENT: 38 minutes.   POSSIBLE D/C IN 1-2 DAYS, DEPENDING ON CLINICAL CONDITION.   Gladstone Lighter M.D on 11/04/2016 at 10:13 AM  Between 7am to 6pm - Pager - [phone]  After 6pm go to [email or web address] - password [email or web address] [facility]  Office  [phone]  CC: Primary care physician; [name], MD

## 2016-11-05 LAB — POTASSIUM
POTASSIUM: 3.3 mmol/L — AB (ref 3.5–5.1)
Potassium: 3.1 mmol/L — ABNORMAL LOW (ref 3.5–5.1)

## 2016-11-05 LAB — GLUCOSE, CAPILLARY: Glucose-Capillary: 154 mg/dL — ABNORMAL HIGH (ref 65–99)

## 2016-11-05 LAB — MAGNESIUM: Magnesium: 1.7 mg/dL (ref 1.7–2.4)

## 2016-11-05 MED ORDER — POTASSIUM CHLORIDE CRYS ER 20 MEQ PO TBCR
40.0000 meq | EXTENDED_RELEASE_TABLET | Freq: Once | ORAL | Status: AC
  Administered 2016-11-05: 40 meq via ORAL
  Filled 2016-11-05: qty 2

## 2016-11-05 MED ORDER — POTASSIUM CHLORIDE 20 MEQ PO PACK
20.0000 meq | PACK | Freq: Once | ORAL | Status: AC
  Administered 2016-11-05: 20 meq via ORAL
  Filled 2016-11-05: qty 1

## 2016-11-05 MED ORDER — MAGNESIUM SULFATE 2 GM/50ML IV SOLN
2.0000 g | Freq: Once | INTRAVENOUS | Status: AC
  Administered 2016-11-05: 2 g via INTRAVENOUS
  Filled 2016-11-05: qty 50

## 2016-11-05 MED ORDER — MAGNESIUM HYDROXIDE 400 MG/5ML PO SUSP
30.0000 mL | Freq: Every day | ORAL | Status: DC | PRN
  Administered 2016-11-06: 30 mL via ORAL
  Filled 2016-11-05: qty 30

## 2016-11-05 NOTE — Progress Notes (Addendum)
[place] at [place] NAME: Michele Schultz    MR#:  [id number]  DATE OF BIRTH:  08-11-1931  SUBJECTIVE:  CHIEF COMPLAINT:   Chief Complaint  Patient presents with  . Emesis   - Feels poor appetite and also generalized weakness. Does not want to go back to assisted living. -Awaiting physical therapy input  REVIEW OF SYSTEMS:  Review of Systems  Constitutional: Positive for malaise/fatigue. Negative for chills and fever.  HENT: Negative for ear discharge, ear pain, hearing loss, nosebleeds and sore throat.   Eyes: Negative for blurred vision and double vision.  Respiratory: Negative for cough, shortness of breath and wheezing.   Cardiovascular: Negative for chest pain, palpitations and leg swelling.  Gastrointestinal: Positive for nausea. Negative for abdominal pain, constipation, diarrhea and vomiting.  Genitourinary: Negative for dysuria.  Musculoskeletal: Negative for myalgias.  Neurological: Negative for dizziness, speech change, focal weakness, seizures and headaches.  Psychiatric/Behavioral: Negative for depression.    DRUG ALLERGIES:   Allergies  Allergen Reactions  . Citalopram Other (See Comments)    Reaction:  Altered mental status   . Cymbalta [Duloxetine Hcl] Other (See Comments)    Reaction:  Sedative for pt   . Imipramine Other (See Comments)    Reaction:  Unknown   . Proton Pump Inhibitors Other (See Comments)    Reaction:  Unknown   . Venlafaxine Nausea And Vomiting and Other (See Comments)    Reaction:  Dizziness     VITALS:  Blood pressure 130/68, pulse (!) 103, temperature 98.1 F (36.7 C), temperature source Oral, resp. rate 18, height 5\' 4"  (1.626 m), weight 111.9 kg (246 lb 9.6 oz), SpO2 95 %.  PHYSICAL EXAMINATION:  Physical Exam  GENERAL:  82 y.o.-year-old Obese patient lying in the bed with no acute distress.  EYES: Pupils equal, round, reactive to light and accommodation. No scleral icterus.  Extraocular muscles intact.  HEENT: Head atraumatic, normocephalic. Oropharynx and nasopharynx clear.  NECK:  Supple, no jugular venous distention. No thyroid enlargement, no tenderness.  LUNGS: Normal breath sounds bilaterally, no wheezing, rales,rhonchi or crepitation. No use of accessory muscles of respiration. Decreased bibasilar breath sounds. CARDIOVASCULAR: S1, S2 normal. No murmurs, rubs, or gallops.  ABDOMEN: Soft, nontender, nondistended. Bowel sounds present. No organomegaly or mass.  EXTREMITIES: No pedal edema, cyanosis, or clubbing.  NEUROLOGIC: Cranial nerves II through XII are intact. Muscle strength 5/5 in all extremities. Sensation intact. Gait not checked. Generalized weakness present. PSYCHIATRIC: The patient is alert and oriented x 3.  SKIN: No obvious rash, lesion, or ulcer.    LABORATORY PANEL:   CBC  Recent Labs Lab 11/02/16 0522  WBC 5.2  HGB 10.7*  HCT 33.2*  PLT 260   ------------------------------------------------------------------------------------------------------------------  Chemistries   Recent Labs Lab 11/01/16 0613  11/03/16 0328 11/05/16 0540  NA 143  < > 143  --   K 2.7*  < > 3.7 3.1*  CL 102  < > 110  --   CO2 31  < > 26  --   GLUCOSE 155*  < > 137*  --   BUN <5*  < > 6  --   CREATININE 0.56  < > 0.58  --   CALCIUM 9.1  < > 8.6*  --   MG 2.1  --  1.8 1.7  AST 34  --   --   --   ALT 17  --   --   --   [facility]  86  --   --   --   BILITOT 0.5  --   --   --   < > = values in this interval not displayed. ------------------------------------------------------------------------------------------------------------------  Cardiac Enzymes No results for input(s): TROPONINI in the last 168 hours. ------------------------------------------------------------------------------------------------------------------  RADIOLOGY:  Dg Chest 1 View  Result Date: 11/03/2016 CLINICAL DATA:  Short of breath EXAM: CHEST 1 VIEW COMPARISON:   11/01/2016 FINDINGS: CABG.  Dual lead pacemaker unchanged.  COPD Patchy airspace disease in left lung base has progressed and may represent pneumonia. No effusion. IMPRESSION: Left lower lobe airspace disease, possible pneumonia Electronically Signed   By: Franchot Gallo M.D.   On: 11/03/2016 16:27    EKG:   Orders placed or performed during the hospital encounter of 11/01/16  . EKG 12-Lead  . EKG 12-Lead  . ED EKG  . ED EKG    ASSESSMENT AND PLAN:   82 year old female with past medical history significant for severe aortic stenosis, chronic diastolic heart failure, morbid obesity, seasonal atrial fibrillation who had several admissions for nausea vomiting comes back again with same complaints.  #1 chronic nausea and vomiting-noncompliant with low fat diet. -No abdominal pain. Prior workup negative. The EGD was normal. -Hasn't thrown up in the hospital and has been eating fine. -We'll continue when necessary Zofran.  #2 left lower lobe pneumonia on x-ray-encourage incentive spirometry. On Levaquin. -No fevers.  #3 paroxysmal atrial fibrillation-rate controlled, on Cardizem. Not an anticoagulation candidate.  #4 Hyperlipidemia-continue statin  #5 DVT prophylaxis-on Lovenox  #6 hypokalemia and hypomagnesemia-being replaced  appealed her discharge. Awaiting social worker input for rehabilitation placement. -Physical therapy consulted   All the records are reviewed and case discussed with Care Management/Social Workerr. Management plans discussed with the patient, family and they are in agreement.  CODE STATUS: DO NOT RESUSCITATE  TOTAL TIME TAKING CARE OF THIS PATIENT: 35 minutes.   POSSIBLE D/C TOMORROW, DEPENDING ON CLINICAL CONDITION.   Gladstone Lighter M.D on 11/05/2016 at 10:54 AM  Between 7am to 6pm - Pager - [phone]  After 6pm go to [email or web address] - password [email or web address] [facility]  Office  [phone]  CC: Primary care physician;  [name], MD

## 2016-11-05 NOTE — Progress Notes (Signed)
MEDICATION RELATED CONSULT NOTE - Follow Up Pharmacy Consult for Electrolyte replacement Indication: hypokalemia/hypomagnesia  Allergies  Allergen Reactions  . Citalopram Other (See Comments)    Reaction:  Altered mental status   . Cymbalta [Duloxetine Hcl] Other (See Comments)    Reaction:  Sedative for pt   . Imipramine Other (See Comments)    Reaction:  Unknown   . Proton Pump Inhibitors Other (See Comments)    Reaction:  Unknown   . Venlafaxine Nausea And Vomiting and Other (See Comments)    Reaction:  Dizziness     Patient Measurements: Height: 5\' 4"  (162.6 cm) Weight: 246 lb 9.6 oz (111.9 kg) IBW/kg (Calculated) : 54.7   Vital Signs: Temp: 98.2 F (36.8 C) (12/03 0442) Temp Source: Oral (12/03 0442) BP: 138/65 (12/03 0442) Pulse Rate: 94 (12/03 0442) Intake/Output from previous day: 12/02 0701 - 12/03 0700 In: 600 [P.O.:600] Out: 450 [Urine:450] Intake/Output from this shift: No intake/output data recorded.  Labs:  Recent Labs  11/03/16 0328 11/05/16 0540  CREATININE 0.58  --   MG 1.8 1.7  PHOS 2.1*  --    Lab Results  Component Value Date   K 3.1 (L) 11/05/2016   Estimated Creatinine Clearance: 65.3 mL/min (by C-G formula based on SCr of 0.58 mg/dL).  Medications:  Scheduled:  . acidophilus  1 capsule Oral BID  . atorvastatin  80 mg Oral Daily  . diltiazem  120 mg Oral Daily  . enoxaparin (LOVENOX) injection  40 mg Subcutaneous BID  . levofloxacin  500 mg Oral Daily  . magnesium sulfate 1 - 4 g bolus IVPB  2 g Intravenous Once  . potassium chloride  40 mEq Oral Once   Infusions:    Assessment: 82 yo F w/ intractable nausea, vomiting, diarrhea for the past 2 days. ?viral gasteroenteritis. Home lasix on hold.  Hx: morbid obesity, severe aortic stenosis, diastolic CHF, Afib, pacemaker, chronic SOB, DM, GERD  12/3:  K = 3.1, Mag = 1.7  Plan:  MD has ordered KCl 65meq PO x 1 dose.  Will recheck K at 18:00.  [place] Clinical Pharmacist 11/05/2016,7:46 AM

## 2016-11-05 NOTE — Progress Notes (Signed)
MEDICATION RELATED CONSULT NOTE - Follow Up Pharmacy Consult for Electrolyte replacement Indication: hypokalemia/hypomagnesia  Allergies  Allergen Reactions  . Citalopram Other (See Comments)    Reaction:  Altered mental status   . Cymbalta [Duloxetine Hcl] Other (See Comments)    Reaction:  Sedative for pt   . Imipramine Other (See Comments)    Reaction:  Unknown   . Proton Pump Inhibitors Other (See Comments)    Reaction:  Unknown   . Venlafaxine Nausea And Vomiting and Other (See Comments)    Reaction:  Dizziness     Patient Measurements: Height: 5\' 4"  (162.6 cm) Weight: 246 lb 9.6 oz (111.9 kg) IBW/kg (Calculated) : 54.7   Vital Signs: Temp: 98.1 F (36.7 C) (12/03 0800) Temp Source: Oral (12/03 0800) BP: 130/68 (12/03 0800) Pulse Rate: 103 (12/03 0800) Intake/Output from previous day: 12/02 0701 - 12/03 0700 In: 600 [P.O.:600] Out: 450 [Urine:450] Intake/Output from this shift: Total I/O In: 120 [P.O.:120] Out: -   Labs:  Recent Labs  11/03/16 0328 11/05/16 0540  CREATININE 0.58  --   MG 1.8 1.7  PHOS 2.1*  --    Lab Results  Component Value Date   K 3.3 (L) 11/05/2016   Estimated Creatinine Clearance: 65.3 mL/min (by C-G formula based on SCr of 0.58 mg/dL).  Medications:  Scheduled:  . acidophilus  1 capsule Oral BID  . atorvastatin  80 mg Oral Daily  . diltiazem  120 mg Oral Daily  . enoxaparin (LOVENOX) injection  40 mg Subcutaneous BID  . levofloxacin  500 mg Oral Daily   Infusions:    Assessment: 82 yo F w/ intractable nausea, vomiting, diarrhea for the past 2 days. ?viral gasteroenteritis. Home lasix on hold.  Hx: morbid obesity, severe aortic stenosis, diastolic CHF, Afib, pacemaker, chronic SOB, DM, GERD  12/3:  K = 3.1, Mag = 1.7   Plan:  12/3 PM: K= 3.3  Will replace KCL 9mEq PO x1 dose. Will recheck K+ level with am labs.   Pernell Dupre, PharmD, BCPS Clinical Pharmacist 11/05/2016 6:54 PM

## 2016-11-05 NOTE — Progress Notes (Signed)
Pt. Complains of vomiting, no vomit observed, only a small amount of clear saliva observed in the basin given to pt. To vomit in. Zofran IV given to pt. To help with her vomiting/nausea.

## 2016-11-06 LAB — GLUCOSE, CAPILLARY
GLUCOSE-CAPILLARY: 138 mg/dL — AB (ref 65–99)
GLUCOSE-CAPILLARY: 131 mg/dL — AB (ref 65–99)

## 2016-11-06 LAB — MAGNESIUM: MAGNESIUM: 2.1 mg/dL (ref 1.7–2.4)

## 2016-11-06 LAB — POTASSIUM: Potassium: 3.4 mmol/L — ABNORMAL LOW (ref 3.5–5.1)

## 2016-11-06 MED ORDER — LEVOFLOXACIN 500 MG PO TABS: 500.0000 mg | ORAL_TABLET | Freq: Every day | ORAL | 0 refills | Status: DC

## 2016-11-06 MED ORDER — DILTIAZEM HCL ER COATED BEADS 180 MG PO CP24: 180.0000 mg | ORAL_CAPSULE | Freq: Every day | ORAL | 2 refills | Status: DC

## 2016-11-06 MED ORDER — ONDANSETRON 4 MG PO TBDP: 4.0000 mg | ORAL_TABLET | Freq: Three times a day (TID) | ORAL | 0 refills | Status: DC | PRN

## 2016-11-06 MED ORDER — POTASSIUM CHLORIDE CRYS ER 20 MEQ PO TBCR
40.0000 meq | EXTENDED_RELEASE_TABLET | Freq: Once | ORAL | Status: AC
  Administered 2016-11-06: 40 meq via ORAL
  Filled 2016-11-06: qty 2

## 2016-11-06 NOTE — Care Management (Signed)
Patient's medicare discharge appeal was canceled 12/2 due to new finding of pneumonia on chest xray and new treatment orders.  There is a new physical therapy consult pending which is not needed in order to discharge.  Physical therapy has already evaluated patient and recommended skilled nursing placement. According to interqual criteria- she is medically stable for discharge. Anticipate she should transfer to skilled nursing facility today

## 2016-11-06 NOTE — Progress Notes (Signed)
Pt. Slept off and on throughout the night, called frequently for various reasons, repositioned frequently, checked and dry appropriately.

## 2016-11-06 NOTE — Progress Notes (Signed)
[place] at [place] NAME: Michele Schultz    MR#:  [id number]  DATE OF BIRTH:  10/01/1931  SUBJECTIVE:  CHIEF COMPLAINT:   Chief Complaint  Patient presents with  . Emesis   - Feels poor appetite, no nausea today. Does not want to go back to assisted living. -Awaiting physical therapy input  REVIEW OF SYSTEMS:  Review of Systems  Constitutional: Positive for malaise/fatigue. Negative for chills and fever.  HENT: Negative for ear discharge, ear pain, hearing loss, nosebleeds and sore throat.   Eyes: Negative for blurred vision and double vision.  Respiratory: Negative for cough, shortness of breath and wheezing.   Cardiovascular: Negative for chest pain, palpitations and leg swelling.  Gastrointestinal: Positive for nausea. Negative for abdominal pain, constipation, diarrhea and vomiting.  Genitourinary: Negative for dysuria.  Musculoskeletal: Negative for myalgias.  Neurological: Negative for dizziness, speech change, focal weakness, seizures and headaches.  Psychiatric/Behavioral: Negative for depression.    DRUG ALLERGIES:   Allergies  Allergen Reactions  . Citalopram Other (See Comments)    Reaction:  Altered mental status   . Cymbalta [Duloxetine Hcl] Other (See Comments)    Reaction:  Sedative for pt   . Imipramine Other (See Comments)    Reaction:  Unknown   . Proton Pump Inhibitors Other (See Comments)    Reaction:  Unknown   . Venlafaxine Nausea And Vomiting and Other (See Comments)    Reaction:  Dizziness     VITALS:  Blood pressure (!) 138/57, pulse 93, temperature 98.5 F (36.9 C), temperature source Oral, resp. rate 18, height 5\' 4"  (1.626 m), weight 111.8 kg (246 lb 8 oz), SpO2 95 %.  PHYSICAL EXAMINATION:  Physical Exam  GENERAL:  82 y.o.-year-old Obese patient lying in the bed with no acute distress.  EYES: Pupils equal, round, reactive to light and accommodation. No scleral icterus. Extraocular muscles  intact.  HEENT: Head atraumatic, normocephalic. Oropharynx and nasopharynx clear.  NECK:  Supple, no jugular venous distention. No thyroid enlargement, no tenderness.  LUNGS: Normal breath sounds bilaterally, no wheezing, rales,rhonchi or crepitation. No use of accessory muscles of respiration. Decreased bibasilar breath sounds. CARDIOVASCULAR: S1, S2 normal. No murmurs, rubs, or gallops.  ABDOMEN: Soft, nontender, nondistended. Bowel sounds present. No organomegaly or mass.  EXTREMITIES: No pedal edema, cyanosis, or clubbing.  NEUROLOGIC: Cranial nerves II through XII are intact. Muscle strength 5/5 in all extremities. Sensation intact. Gait not checked. Generalized weakness present. PSYCHIATRIC: The patient is alert and oriented x 3.  SKIN: No obvious rash, lesion, or ulcer.    LABORATORY PANEL:   CBC  Recent Labs Lab 11/02/16 0522  WBC 5.2  HGB 10.7*  HCT 33.2*  PLT 260   ------------------------------------------------------------------------------------------------------------------  Chemistries   Recent Labs Lab 11/01/16 0613  11/03/16 0328  11/06/16 0413  NA 143  < > 143  --   --   K 2.7*  < > 3.7  < > 3.4*  CL 102  < > 110  --   --   CO2 31  < > 26  --   --   GLUCOSE 155*  < > 137*  --   --   BUN <5*  < > 6  --   --   CREATININE 0.56  < > 0.58  --   --   CALCIUM 9.1  < > 8.6*  --   --   MG 2.1  --  1.8  < >  2.1  AST 34  --   --   --   --   ALT 17  --   --   --   --   ALKPHOS 86  --   --   --   --   BILITOT 0.5  --   --   --   --   < > = values in this interval not displayed. ------------------------------------------------------------------------------------------------------------------  Cardiac Enzymes No results for input(s): TROPONINI in the last 168 hours. ------------------------------------------------------------------------------------------------------------------  RADIOLOGY:  No results found.  EKG:   Orders placed or performed during the  hospital encounter of 11/01/16  . EKG 12-Lead  . EKG 12-Lead  . ED EKG  . ED EKG    ASSESSMENT AND PLAN:   82 year old female with past medical history significant for severe aortic stenosis, chronic diastolic heart failure, morbid obesity, seasonal atrial fibrillation who had several admissions for nausea vomiting comes back again with same complaints.  #1 chronic nausea and vomiting-noncompliant with low fat diet. Poor appetite. -No abdominal pain. Prior workup negative. -Hasn't thrown up in the hospital, eating about 25% of her meals. - recent EGD on a prior adm, normal -We'll continue when necessary Zofran/reglan at discharge.  #2 left lower lobe pneumonia on x-ray-encourage incentive spirometry. On Levaquin. Stop after 5 days -No fevers. - not needing o2  #3 paroxysmal atrial fibrillation-rate controlled, on Cardizem. Not an anticoagulation candidate.  #4 Hyperlipidemia-continue statin  #5 DVT prophylaxis-on Lovenox  #6 hypokalemia and hypomagnesemia-replaced  appealed her discharge. Awaiting social worker input for rehabilitation placement. -Physical therapy consulted   All the records are reviewed and case discussed with Care Management/Social Workerr. Management plans discussed with the patient, family and they are in agreement.  CODE STATUS: DO NOT RESUSCITATE  TOTAL TIME TAKING CARE OF THIS PATIENT: 35 minutes.   POSSIBLE D/C TODAY ONCE PLACEMENT IS CONFIRMED, DEPENDING ON CLINICAL CONDITION.   Gladstone Lighter M.D on 11/06/2016 at 7:55 AM  Between 7am to 6pm - Pager - [phone]  After 6pm go to [email or web address] - password [email or web address] [facility]  Office  [phone]  CC: Primary care physician; [name], MD

## 2016-11-06 NOTE — Clinical Social Work Placement (Signed)
   CLINICAL SOCIAL WORK PLACEMENT  NOTE  Date:  11/06/2016  Patient Details  Name: Michele Schultz MRN: [id number] Date of Birth: [birth year]  Clinical Social Work is seeking post-discharge placement for this patient at the [place] level of care (*CSW will initial, date and re-position this form in  chart as items are completed):  Yes   Patient/family provided with [place] Work Department's list of facilities offering this level of care within the geographic area requested by the patient (or if unable, by the patient's family).  Yes   Patient/family informed of their freedom to choose among providers that offer the needed level of care, that participate in Medicare, Medicaid or managed care program needed by the patient, have an available bed and are willing to accept the patient.  Yes   Patient/family informed of [place]'s ownership interest in [facility] and [facility], as well as of the fact that they are under no obligation to receive care at these facilities.  PASRR submitted to EDS on 11/03/16     PASRR number received on       Existing PASRR number confirmed on 11/06/16     FL2 transmitted to all facilities in geographic area requested by pt/family on 11/03/16     FL2 transmitted to all facilities within larger geographic area on       Patient informed that his/her managed care company has contracts with or will negotiate with certain facilities, including the following:        Yes   Patient/family informed of bed offers received.  Patient chooses bed at [facility]     Physician recommends and patient chooses bed at      Patient to be transferred to [facility] on 11/06/16.  Patient to be transferred to facility by [facility]     Patient family notified on 11/06/16 of transfer.  Name of family member notified:  Knight,Shelia Niece [phone]      PHYSICIAN       Additional Comment:     _______________________________________________ Ross Ludwig 11/06/2016, 1:18 PM

## 2016-11-06 NOTE — NC FL2 (Signed)
[place] LEVEL OF CARE SCREENING TOOL     IDENTIFICATION  Patient Name: Michele Schultz Birthdate: November 04, 1931 Sex: female Admission Date (Current Location): 11/01/2016  [place] and [place] Number:  Engineering geologist and Address:  [facility], [address] [place], [place] [address]      Provider Number: [id number]  Attending Physician Name and Address:  Gladstone Lighter, MD  Relative Name and Phone Number:  Delrae Sawyers [phone] or Knight,Shelia Niece [phone]  [phone]    Current Level of Care: Hospital Recommended Level of Care: [place] Prior Approval Number:    Date Approved/Denied:   PASRR Number: [id number]  Discharge Plan: SNF    Current Diagnoses: Patient Active Problem List   Diagnosis Date Noted  . Adjustment disorder with depressed mood 11/02/2016  . Nausea & vomiting 11/01/2016  . Nausea and vomiting   . Atrial fibrillation with RVR ([place]) 10/11/2016  . Hypokalemia 10/11/2016  . DNR (do not resuscitate) 09/19/2016  . Palliative care by specialist 09/19/2016  . Muscle weakness (generalized)   . Atrial tachycardia ([place])   . Diarrhea 09/17/2016  . Dehydration 09/17/2016  . Metabolic acidosis [id number]  . Headache due to trauma 07/03/2016  . Hypertensive heart disease   . Wide-complex tachycardia ([place])   . Pain in the chest   . Emesis   . Weakness of both legs   . Coronary artery disease involving coronary bypass graft of native heart with unspecified angina pectoris   . S/P AVR (aortic valve replacement)   . Falls frequently   . HLD (hyperlipidemia)   . Fibromyalgia   . RAD (reactive airway disease)   . Morbid obesity ([place])   . Aortic stenosis, severe   . Chronic pain 06/17/2014  . Pain in the muscles 06/19/2013  . Chest pain 06/19/2013  . Wheezing 06/19/2013  . Weakness generalized 06/03/2013  . Dyspnea 01/27/2013  . Nausea 03/05/2012  . Constipation  03/05/2012  . Atrial fibrillation-non sustained 02/09/2012  . Cardiac pacemaker -st Judes   . Chronic diastolic heart failure ([place])   . Coronary artery disease   . Long term (current) use of anticoagulants 01/03/2012  . S/P CABG x 2 11/14/2011  . S/P aortic valve replacement with porcine valve 11/14/2011  . Back pain 11/14/2011  . Complete heart block ([place]) 10/20/2011  . HTN (hypertension) 09/15/2011  . Spinal stenosis 03/22/2011  . Fatigue 03/22/2011  . Hyperlipidemia 03/22/2011    Orientation RESPIRATION BLADDER Height & Weight     Self, Time, Place  Normal Incontinent Weight: 246 lb 8 oz (111.8 kg) Height:  5\' 4"  (162.6 cm)  BEHAVIORAL SYMPTOMS/MOOD NEUROLOGICAL BOWEL NUTRITION STATUS      Incontinent Diet (Cardiac)  AMBULATORY STATUS COMMUNICATION OF NEEDS Skin   Limited Assist Verbally Normal                       Personal Care Assistance Level of Assistance  Bathing, Feeding, Dressing Bathing Assistance: Limited assistance Feeding assistance: Independent Dressing Assistance: Limited assistance     Functional Limitations Info  Sight, Hearing, Speech Sight Info: Adequate Hearing Info: Adequate Speech Info: Adequate    SPECIAL CARE FACTORS FREQUENCY  PT (By licensed PT)     PT Frequency: 5x a week              Contractures Contractures Info: Not present    Additional Factors Info  Code Status, Allergies Code Status Info: DNR Allergies Info: CITALOPRAM,  CYMBALTA DULOXETINE HCL, IMIPRAMINE, PROTON PUMP INHIBITORS, VENLAFAXINE            Current Medications (11/06/2016):  This is the current hospital active medication list Current Facility-Administered Medications  Medication Dose Route Frequency Provider Last Rate Last Dose  . acetaminophen (TYLENOL) tablet 650 mg  650 mg Oral Q6H PRN Epifanio Lesches, MD   650 mg at 11/05/16 1819   Or  . acetaminophen (TYLENOL) suppository 650 mg  650 mg Rectal Q6H PRN Epifanio Lesches, MD      .  acidophilus (RISAQUAD) capsule 1 capsule  1 capsule Oral BID Epifanio Lesches, MD   1 capsule at 11/05/16 2121  . atorvastatin (LIPITOR) tablet 80 mg  80 mg Oral Daily Epifanio Lesches, MD   80 mg at 11/04/16 0933  . diltiazem (CARDIZEM CD) 24 hr capsule 120 mg  120 mg Oral Daily Epifanio Lesches, MD   120 mg at 11/04/16 0933  . enoxaparin (LOVENOX) injection 40 mg  40 mg Subcutaneous BID Epifanio Lesches, MD   40 mg at 11/05/16 2121  . hydrALAZINE (APRESOLINE) injection 5 mg  5 mg Intravenous Q4H PRN Epifanio Lesches, MD      . levofloxacin (LEVAQUIN) tablet 500 mg  500 mg Oral Daily Gladstone Lighter, MD   500 mg at 11/04/16 0933  . magnesium hydroxide (MILK OF MAGNESIA) suspension 30 mL  30 mL Oral Daily PRN Gladstone Lighter, MD   30 mL at 11/06/16 0510  . ondansetron (ZOFRAN) tablet 4 mg  4 mg Oral Q6H PRN Epifanio Lesches, MD       Or  . ondansetron (ZOFRAN) injection 4 mg  4 mg Intravenous Q6H PRN Epifanio Lesches, MD   4 mg at 11/06/16 0830     Discharge Medications: Please see discharge summary for a list of discharge medications.  Relevant Imaging Results:  Relevant Lab Results:   Additional Information SSN [id number]  Ross Ludwig

## 2016-11-06 NOTE — Discharge Summary (Addendum)
[facility] NAME: Michele Schultz    MR#:  [id number]  DATE OF BIRTH:  October 04, 1931  DATE OF ADMISSION:  11/01/2016   ADMITTING PHYSICIAN: Epifanio Lesches, MD  DATE OF DISCHARGE: 11/06/16  PRIMARY CARE PHYSICIAN: [name], MD   ADMISSION DIAGNOSIS:   Hypokalemia [E87.6] Lactic acidosis [E87.2] Generalized abdominal pain [R10.84] Nausea & vomiting [R11.2] Nausea vomiting and diarrhea [R11.2, R19.7] Abdominal pain [R10.9]  DISCHARGE DIAGNOSIS:   Principal Problem:   Adjustment disorder with depressed mood Active Problems:   Nausea & vomiting   SECONDARY DIAGNOSIS:   Past Medical History:  Diagnosis Date  . Anemia   . Aortic stenosis, severe    a. s/p Magna Ease pericardial tissue valve size 21 mm replacement in 10/2011 for severe AS 11/12; b. echo 07/2015: EF 55-60% mod concentric LVH, GR1DD, LA mildly dilated, PASP 45 mm Hg  . Atrial tachycardia, paroxysmal (HCC)    a. with rate related LBBB.  . Cardiac pacemaker -st Judes    11/12  . Chronic diastolic heart failure ([place])    a. echo 2014: EF 55-60%, no RWMA, GR1DD, PASP 47 mm Hg; b. echo 07/2015: EF 55-60% mod concentric LVH, GR1DD, LA mildly dilated, PASP 45 mm Hg  . Complete heart block [facility])    a. s/p St Jude PPM 10/2011 (Ser # [id number]).  . Coronary artery disease    a. s/p 2v CABG 11/12 (VG-LAD, VG-OM1); b. Lexiscan 08/2015: low risk, no ischemia, EF 55-65%.  . Enthesopathy of hip region   . Esophageal reflux    followed by Dr.Seigal. stabilized with a combination of Nexium and Zantac  . Fibromyalgia   . HLD (hyperlipidemia)   . Hypertensive heart disease   . Knee joint replacement by other means   . Morbid obesity ([place])   . Neuralgia, neuritis, and radiculitis, unspecified   . Neuropathy ([place])   . Onychia and paronychia of toe   . Osteoarthrosis, unspecified whether generalized or localized, pelvic region and thigh    mainly in her back  and knees  . PAF (paroxysmal atrial fibrillation) ([place])    a. brief episodes of AF previously noted on device interrogations.  [place] Kitchen RAD (reactive airway disease)    a. chronic SOB  . Spinal stenosis   . Stress incontinence, female    followed by Dr.Cope  . Type II or unspecified type diabetes mellitus without mention of complication, not stated as uncontrolled    a. pt. reports that she is borderline   . Wide-complex tachycardia ([place])    a. Noted 4/10 - brief episode in ED. Noted again 4/21 in clinic->felt most likely to be atrial tach.    HOSPITAL COURSE:   82 year old female with past medical history significant for severe aortic stenosis, chronic diastolic heart failure, morbid obesity, seasonal atrial fibrillation who had several admissions for nausea vomiting comes back again with same complaints.  #1 chronic nausea and vomiting-noncompliant with low fat diet. Poor appetite. -No abdominal pain. Prior workup negative. -Hasn't thrown up in the hospital, eating about 25% of her meals. - recent EGD on a prior adm, normal -We'll continue when necessary Zofran/phenergan at discharge.  #2 left lower lobe pneumonia on x-ray-encourage incentive spirometry. On Levaquin. Stop after 5 days -No fevers. - not needing o2  #3 paroxysmal atrial fibrillation-rate controlled, on Cardizem. Not an anticoagulation candidate. On aspirin  #4 Hyperlipidemia-continue statin  #5 hypokalemia and hypomagnesemia-replaced  Discharge to rehab today  DISCHARGE CONDITIONS:   Guarded  CONSULTS OBTAINED:   None  DRUG ALLERGIES:   Allergies  Allergen Reactions  . Citalopram Other (See Comments)    Reaction:  Altered mental status   . Cymbalta [Duloxetine Hcl] Other (See Comments)    Reaction:  Sedative for pt   . Imipramine Other (See Comments)    Reaction:  Unknown   . Proton Pump Inhibitors Other (See Comments)    Reaction:  Unknown   . Venlafaxine Nausea And Vomiting and Other (See  Comments)    Reaction:  Dizziness    DISCHARGE MEDICATIONS:     Medication List    STOP taking these medications   traMADol 50 MG tablet Commonly known as:  ULTRAM     TAKE these medications   acetaminophen 325 MG tablet Commonly known as:  TYLENOL Take 650 mg by mouth 3 (three) times daily as needed.   acidophilus Caps capsule Take 1 capsule by mouth 2 (two) times daily.   aspirin EC 81 MG tablet Take 81 mg by mouth daily.   atorvastatin 80 MG tablet Commonly known as:  LIPITOR Take 80 mg by mouth daily.   Calcium Carbonate-Vitamin D 600-400 MG-UNIT tablet Take 1 tablet by mouth daily.   Cholecalciferol 1000 units capsule Take 1,000 Units by mouth daily.   diltiazem 180 MG 24 hr capsule Commonly known as:  CARDIZEM CD Take 1 capsule (180 mg total) by mouth daily. What changed:  medication strength  how much to take   furosemide 20 MG tablet Commonly known as:  LASIX Take 1 tablet (20 mg total) by mouth every other day.   HYDROcodone-acetaminophen 10-325 MG tablet Commonly known as:  NORCO Take 1 tablet by mouth every 4 (four) hours as needed for moderate pain. Reported on 05/30/2016   levofloxacin 500 MG tablet Commonly known as:  LEVAQUIN Take 1 tablet (500 mg total) by mouth daily. X 3 more days   magnesium hydroxide 400 MG/5ML suspension Commonly known as:  MILK OF MAGNESIA Take 10 mLs by mouth daily as needed for mild constipation.   metoCLOPramide 5 MG tablet Commonly known as:  REGLAN Take 1 tablet (5 mg total) by mouth every 6 (six) hours as needed for nausea or vomiting.   nitroGLYCERIN 0.4 MG SL tablet Commonly known as:  NITROSTAT Place 1 tablet (0.4 mg total) under the tongue every 5 (five) minutes as needed for chest pain.   ondansetron 4 MG disintegrating tablet Commonly known as:  ZOFRAN ODT Take 1 tablet (4 mg total) by mouth every 8 (eight) hours as needed for nausea or vomiting.   promethazine 12.5 MG tablet Commonly known as:   PHENERGAN Take 1 tablet (12.5 mg total) by mouth every 6 (six) hours as needed for nausea or vomiting.        DISCHARGE INSTRUCTIONS:   1. PCP f/u in 1-2 weeks  DIET:   Cardiac diet  ACTIVITY:   Activity as tolerated  OXYGEN:   Home Oxygen: No.  Oxygen Delivery: room air  DISCHARGE LOCATION:   nursing home   If you experience worsening of your admission symptoms, develop shortness of breath, life threatening emergency, suicidal or homicidal thoughts you must seek medical attention immediately by calling 911 or calling your MD immediately  if symptoms less severe.  You Must read complete instructions/literature along with all the possible adverse reactions/side effects for all the Medicines you take and that have been prescribed to you. Take any new Medicines after you have  completely understood and accpet all the possible adverse reactions/side effects.   Please note  You were cared for by a hospitalist during your hospital stay. If you have any questions about your discharge medications or the care you received while you were in the hospital after you are discharged, you can call the unit and asked to speak with the hospitalist on call if the hospitalist that took care of you is not available. Once you are discharged, your primary care physician will handle any further medical issues. Please note that NO REFILLS for any discharge medications will be authorized once you are discharged, as it is imperative that you return to your primary care physician (or establish a relationship with a primary care physician if you do not have one) for your aftercare needs so that they can reassess your need for medications and monitor your lab values.    On the day of Discharge:  VITAL SIGNS:   Blood pressure (!) 138/57, pulse 93, temperature 98.5 F (36.9 C), temperature source Oral, resp. rate 18, height 5\' 4"  (1.626 m), weight 111.8 kg (246 lb 8 oz), SpO2 95 %.  PHYSICAL EXAMINATION:      GENERAL:  82 y.o.-year-old Obese patient lying in the bed with no acute distress.  EYES: Pupils equal, round, reactive to light and accommodation. No scleral icterus. Extraocular muscles intact.  HEENT: Head atraumatic, normocephalic. Oropharynx and nasopharynx clear.  NECK:  Supple, no jugular venous distention. No thyroid enlargement, no tenderness.  LUNGS: Normal breath sounds bilaterally, no wheezing, rales,rhonchi or crepitation. No use of accessory muscles of respiration. Decreased bibasilar breath sounds. CARDIOVASCULAR: S1, S2 normal. No murmurs, rubs, or gallops.  ABDOMEN: Soft, nontender, nondistended. Bowel sounds present. No organomegaly or mass.  EXTREMITIES: No pedal edema, cyanosis, or clubbing.  NEUROLOGIC: Cranial nerves II through XII are intact. Muscle strength 5/5 in all extremities. Sensation intact. Gait not checked. Generalized weakness present. PSYCHIATRIC: The patient is alert and oriented x 3.  SKIN: No obvious rash, lesion, or ulcer.   DATA REVIEW:   CBC  Recent Labs Lab 11/02/16 0522  WBC 5.2  HGB 10.7*  HCT 33.2*  PLT 260    Chemistries   Recent Labs Lab 11/01/16 0613  11/03/16 0328  11/06/16 0413  NA 143  < > 143  --   --   K 2.7*  < > 3.7  < > 3.4*  CL 102  < > 110  --   --   CO2 31  < > 26  --   --   GLUCOSE 155*  < > 137*  --   --   BUN <5*  < > 6  --   --   CREATININE 0.56  < > 0.58  --   --   CALCIUM 9.1  < > 8.6*  --   --   MG 2.1  --  1.8  < > 2.1  AST 34  --   --   --   --   ALT 17  --   --   --   --   ALKPHOS 86  --   --   --   --   BILITOT 0.5  --   --   --   --   < > = values in this interval not displayed.   Microbiology Results  Results for orders placed or performed during the hospital encounter of 11/01/16  C difficile quick scan w PCR reflex  Status: None   Collection Time: 11/01/16  5:44 AM  Result Value Ref Range Status   C Diff antigen NEGATIVE NEGATIVE Final   C Diff toxin NEGATIVE NEGATIVE Final   C  Diff interpretation No C. difficile detected.  Final    RADIOLOGY:  No results found.   Management plans discussed with the patient, family and they are in agreement.  CODE STATUS:     Code Status Orders        Start     Ordered   11/01/16 0806  Do not attempt resuscitation (DNR)  Continuous    Question Answer Comment  In the event of cardiac or respiratory ARREST Do not call a "code blue"   In the event of cardiac or respiratory ARREST Do not perform Intubation, CPR, defibrillation or ACLS   In the event of cardiac or respiratory ARREST Use medication by any route, position, wound care, and other measures to relive pain and suffering. May use oxygen, suction and manual treatment of airway obstruction as needed for comfort.      11/01/16 0809    Code Status History    Date Active Date Inactive Code Status Order ID Comments User Context   10/12/2016  1:36 AM 10/21/2016  6:48 PM DNR [id number]  Lance Coon, MD Inpatient   09/19/2016 10:52 AM 09/22/2016  8:50 PM DNR [id number]  Knox Royalty, NP Inpatient   09/17/2016  5:57 PM 09/19/2016 10:52 AM Full Code [id number]  Theodoro Grist, MD Inpatient   06/23/2016  8:20 PM 06/26/2016  1:26 PM Full Code [id number]  Gladstone Lighter, MD Inpatient   06/16/2016  8:35 PM 06/21/2016 10:09 PM Full Code [id number]  Lytle Butte, MD ED   03/13/2016  8:15 PM 03/14/2016  9:02 PM Full Code [id number]  Henreitta Leber, MD Inpatient   09/13/2015  6:51 PM 09/18/2015  7:31 PM Full Code [id number]  Dustin Flock, MD Inpatient   10/20/2011  1:40 PM 10/22/2011  1:27 PM Full Code [id number]  Tiajuana Amass, RN Inpatient    Advance Directive Documentation   Flowsheet Row Most Recent Value  Type of Advance Directive  Out of facility DNR (pink MOST or yellow form)  Pre-existing out of facility DNR order (yellow form or pink MOST form)  Yellow form placed in chart (order not valid for inpatient use)  "MOST" Form in Place?  No data      TOTAL TIME TAKING CARE OF THIS  PATIENT: 38 minutes.    Gladstone Lighter M.D on 11/06/2016 at 8:52 AM  Between 7am to 6pm - Pager - [phone]  After 6pm go to [email or web address] - Proofreader  [facility]  Office  [phone]  CC: Primary care physician; [name], MD   Note: This dictation was prepared with Dragon dictation along with smaller phrase technology. Any transcriptional errors that result from this process are unintentional.

## 2016-11-06 NOTE — Progress Notes (Signed)
Pt. In and out of NSR and V-tach, pt. Asymptomatic with no c/o pain, SOB or acute distress noted. She is bearing down attempting to have a bowel movement. Dr. Marcille Blanco notified, no new orders at this time.

## 2016-11-06 NOTE — Progress Notes (Signed)
Pt to be discharged to [facility] today. Iv and tele removed. report called to the facility. Transport by e.m.s.

## 2016-11-06 NOTE — Care Management Important Message (Signed)
Important Message  Patient Details  Name: Michele Schultz MRN: [id number] Date of Birth: [birth year]   Medicare Important Message Given:  Yes    Katrina Stack, RN 11/06/2016, 9:40 AM

## 2016-11-06 NOTE — Progress Notes (Signed)
MOM was effective for constipation pt. Had a large loose stool. She states she feels much better.

## 2016-11-06 NOTE — Progress Notes (Signed)
Pt. C/o constipation, MOM given.

## 2016-11-06 NOTE — Clinical Social Work Note (Signed)
Patient to be d/c'ed today to [facility].  Patient and family agreeable to plans will transport via ems RN to call report to [phone].  Evette Cristal, MSW Mon-Fri 8a-4:30p [phone]

## 2016-11-09 ENCOUNTER — Encounter: Payer: Self-pay | Admitting: Internal Medicine

## 2016-11-09 ENCOUNTER — Non-Acute Institutional Stay (SKILLED_NURSING_FACILITY): Payer: Medicare Other | Admitting: Internal Medicine

## 2016-11-09 DIAGNOSIS — D638 Anemia in other chronic diseases classified elsewhere: Secondary | ICD-10-CM

## 2016-11-09 DIAGNOSIS — E785 Hyperlipidemia, unspecified: Secondary | ICD-10-CM | POA: Diagnosis not present

## 2016-11-09 DIAGNOSIS — K5909 Other constipation: Secondary | ICD-10-CM

## 2016-11-09 DIAGNOSIS — I4891 Unspecified atrial fibrillation: Secondary | ICD-10-CM | POA: Diagnosis not present

## 2016-11-09 DIAGNOSIS — J181 Lobar pneumonia, unspecified organism: Secondary | ICD-10-CM | POA: Diagnosis not present

## 2016-11-09 DIAGNOSIS — R531 Weakness: Secondary | ICD-10-CM

## 2016-11-09 DIAGNOSIS — R112 Nausea with vomiting, unspecified: Secondary | ICD-10-CM | POA: Diagnosis not present

## 2016-11-09 DIAGNOSIS — I5032 Chronic diastolic (congestive) heart failure: Secondary | ICD-10-CM | POA: Diagnosis not present

## 2016-11-09 DIAGNOSIS — M159 Polyosteoarthritis, unspecified: Secondary | ICD-10-CM

## 2016-11-09 DIAGNOSIS — M797 Fibromyalgia: Secondary | ICD-10-CM

## 2016-11-09 DIAGNOSIS — R4189 Other symptoms and signs involving cognitive functions and awareness: Secondary | ICD-10-CM

## 2016-11-09 DIAGNOSIS — J189 Pneumonia, unspecified organism: Secondary | ICD-10-CM

## 2016-11-09 NOTE — Progress Notes (Signed)
LOCATION: [facility]  PCP: [name], MD   Code Status: DNR  Goals of care: Advanced Directive information Advanced Directives 11/01/2016  Does Patient Have a Medical Advance Directive? Yes  Type of Advance Directive Out of facility DNR (pink MOST or yellow form)  Does patient want to make changes to medical advance directive? No - Patient declined  Copy of [place] in Chart? Yes  Would patient like information on creating a medical advance directive? No - Patient declined  Pre-existing out of facility DNR order (yellow form or pink MOST form) Yellow form placed in chart (order not valid for inpatient use)       Extended Emergency Contact Information Primary Emergency Contact: Salina April, [place] [address] [place] Phone: [phone] Relation: Nephew Secondary Emergency Contact: Knight,Shelia Address: [address]          [place], [place] [address] [place] Phone: [phone] Mobile Phone: [phone] Relation: Niece   Allergies  Allergen Reactions  . Citalopram Other (See Comments)    Reaction:  Altered mental status   . Cymbalta [Duloxetine Hcl] Other (See Comments)    Reaction:  Sedative for pt   . Imipramine Other (See Comments)    Reaction:  Unknown   . Proton Pump Inhibitors Other (See Comments)    Reaction:  Unknown   . Venlafaxine Nausea And Vomiting and Other (See Comments)    Reaction:  Dizziness     Chief Complaint  Patient presents with  . New Admit To SNF    New Admission Visit     HPI:  Patient is a 82 y.o. female seen today for short term rehabilitation post hospital admission from 11/01/16-11/06/16 with nausea and vomiting. She has had some outpatient workup with EGD which have been unrevealing. She was diagnosed with left lower lobe pneumonia and was started on antibiotic.  She has PMH of severe AS, chronic diastolic chf, afib, morbid obesity among  others. She is seen in her room today with her friend at bedside.   Review of Systems:  Constitutional: Negative for fever, chills, diaphoresis. Feels weak and tired. HENT: Negative for headache, congestion, nasal discharge, sore throat, difficulty swallowing.   Eyes: Negative for blurred vision, double vision and discharge.  Respiratory: Negative for cough and wheezing. Positive for shortness of breath of with exertion.  Cardiovascular: Negative for chest pain, palpitations, leg swelling.  Gastrointestinal: Negative for vomiting, abdominal pain, loss of appetite. Positive for heartburn and nausea. Last bowel movement was yesterday with hard stool. Genitourinary: Negative for dysuria.  Musculoskeletal: Negative for back pain, fall in the facility. Positive for arthritis pain.  Skin: Negative for itching, rash.  Neurological: Negative for dizziness. Positive for fibromyalgia.  Psychiatric/Behavioral: Negative for depression.   Past Medical History:  Diagnosis Date  . Anemia   . Aortic stenosis, severe    a. s/p Magna Ease pericardial tissue valve size 21 mm replacement in 10/2011 for severe AS 11/12; b. echo 07/2015: EF 55-60% mod concentric LVH, GR1DD, LA mildly dilated, PASP 45 mm Hg  . Atrial tachycardia, paroxysmal (HCC)    a. with rate related LBBB.  . Cardiac pacemaker -st Judes    11/12  . Chronic diastolic heart failure ([place])    a. echo 2014: EF 55-60%, no RWMA, GR1DD, PASP 47 mm Hg; b. echo 07/2015: EF 55-60% mod concentric LVH, GR1DD, LA mildly dilated, PASP 45 mm Hg  .  Complete heart block [facility])    a. s/p St Jude PPM 10/2011 (Ser # [id number]).  . Coronary artery disease    a. s/p 2v CABG 11/12 (VG-LAD, VG-OM1); b. Lexiscan 08/2015: low risk, no ischemia, EF 55-65%.  . Enthesopathy of hip region   . Esophageal reflux    followed by Dr.Seigal. stabilized with a combination of Nexium and Zantac  . Fibromyalgia   . HLD (hyperlipidemia)   . Hypertensive heart disease   . Knee  joint replacement by other means   . Morbid obesity ([place])   . Neuralgia, neuritis, and radiculitis, unspecified   . Neuropathy ([place])   . Onychia and paronychia of toe   . Osteoarthrosis, unspecified whether generalized or localized, pelvic region and thigh    mainly in her back and knees  . PAF (paroxysmal atrial fibrillation) ([place])    a. brief episodes of AF previously noted on device interrogations.  Marland Kitchen RAD (reactive airway disease)    a. chronic SOB  . Spinal stenosis   . Stress incontinence, female    followed by Dr.Cope  . Type II or unspecified type diabetes mellitus without mention of complication, not stated as uncontrolled    a. pt. reports that she is borderline   . Wide-complex tachycardia ([place])    a. Noted 4/10 - brief episode in ED. Noted again 4/21 in clinic->felt most likely to be atrial tach.   Past Surgical History:  Procedure Laterality Date  . AORTIC VALVE REPLACEMENT  10/17/2011   Procedure: AORTIC VALVE REPLACEMENT (AVR);  Surgeon: Melrose Nakayama, MD;  Location: [place];  Service: Open Heart Surgery;  Laterality: N/A;  . CARDIAC CATHETERIZATION  08/2009   50% stenosis distal left main, 50% stenosis ostial left circumflex.   Marland Kitchen CARDIAC CATHETERIZATION     at [facility]  . CORONARY ARTERY BYPASS GRAFT  10/17/2011   Procedure: CORONARY ARTERY BYPASS GRAFTING (CABG);  Surgeon: Melrose Nakayama, MD;  Location: [place];  Service: Open Heart Surgery;  Laterality: N/A;  Times Two, using right leg greater saphenous vein harvested endoscopically  . ESOPHAGOGASTRODUODENOSCOPY (EGD) WITH PROPOFOL N/A 10/12/2016   Procedure: ESOPHAGOGASTRODUODENOSCOPY (EGD) WITH PROPOFOL;  Surgeon: Lucilla Lame, MD;  Location: [facility] ENDOSCOPY;  Service: Endoscopy;  Laterality: N/A;  . EYE SURGERY     IOL/ after cataracts removed- bilateral   . NASAL SINUS SURGERY  2009  . PERMANENT PACEMAKER INSERTION N/A 10/20/2011   Procedure: PERMANENT PACEMAKER INSERTION;  Surgeon: Thompson Grayer, MD;   Location[facility] C[facility];  Service: Cardiovascular;  Laterality: N/A;  . REPLACEMENT TOTAL KNEE  11/2006   right knee  . TOTAL HIP ARTHROPLASTY  09/2010   Social History:   reports that she has never smoked. She has never used smokeless tobacco. She reports that she does not drink alcohol or use drugs.  Family History  Problem Relation Age of Onset  . Heart disease Sister   . Heart disease Brother   . Heart disease Brother   . Heart disease Brother   . Diabetes Other   . Anesthesia problems Neg Hx   . Hypotension Neg Hx   . Malignant hyperthermia Neg Hx   . Pseudochol deficiency Neg Hx     Medications:   Medication List       Accurate as of 11/09/16 12:20 PM. Always use your most recent med list.          acetaminophen 325 MG tablet Commonly known as:  TYLENOL Take 650 mg by mouth 3 (  three) times daily as needed.   acidophilus Caps capsule Take 1 capsule by mouth 2 (two) times daily.   aspirin EC 81 MG tablet Take 81 mg by mouth daily.   atorvastatin 80 MG tablet Commonly known as:  LIPITOR Take 80 mg by mouth daily.   Calcium Carbonate-Vitamin D 600-400 MG-UNIT tablet Take 1 tablet by mouth daily.   Cholecalciferol 1000 units capsule Take 1,000 Units by mouth daily.   diltiazem 180 MG 24 hr capsule Commonly known as:  CARDIZEM CD Take 1 capsule (180 mg total) by mouth daily.   furosemide 20 MG tablet Commonly known as:  LASIX Take 1 tablet (20 mg total) by mouth every other day.   HYDROcodone-acetaminophen 10-325 MG tablet Commonly known as:  NORCO Take 1 tablet by mouth every 4 (four) hours as needed for moderate pain. Reported on 05/30/2016   levofloxacin 500 MG tablet Commonly known as:  LEVAQUIN Take 1 tablet (500 mg total) by mouth daily. X 3 more days   magnesium hydroxide 400 MG/5ML suspension Commonly known as:  MILK OF MAGNESIA Take 10 mLs by mouth daily as needed for mild constipation.   metoCLOPramide 5 MG tablet Commonly known as:   REGLAN Take 1 tablet (5 mg total) by mouth every 6 (six) hours as needed for nausea or vomiting.   nitroGLYCERIN 0.4 MG SL tablet Commonly known as:  NITROSTAT Place 1 tablet (0.4 mg total) under the tongue every 5 (five) minutes as needed for chest pain.   ondansetron 4 MG disintegrating tablet Commonly known as:  ZOFRAN ODT Take 1 tablet (4 mg total) by mouth every 8 (eight) hours as needed for nausea or vomiting.   promethazine 12.5 MG tablet Commonly known as:  PHENERGAN Take 1 tablet (12.5 mg total) by mouth every 6 (six) hours as needed for nausea or vomiting.       Immunizations: Immunization History  Administered Date(s) Administered  . Influenza,inj,Quad PF,36+ Mos 10/13/2016  . PPD Test 03/14/2016, 06/24/2016, 11/06/2016     Physical Exam:  Vitals:   11/09/16 1208  BP: (!) 148/62  Pulse: 89  Resp: 19  Temp: (!) 96.8 F (36 C)  TempSrc: Oral  SpO2: 96%  Weight: 238 lb 12.8 oz (108.3 kg)  Height: 5\' 4"  (1.626 m)   Body mass index is 40.99 kg/m.  General- elderly female, morbidly obese, in no acute distress Head- normocephalic, atraumatic Nose- no maxillary or frontal sinus tenderness, no nasal discharge Throat- moist mucus membrane Eyes- PERRLA, EOMI, no pallor, no icterus, no discharge, normal conjunctiva, normal sclera Neck- no cervical lymphadenopathy Cardiovascular- normal s1,s2, no murmur Respiratory- bilateral clear to auscultation, no wheeze, no rhonchi, no crackles, no use of accessory muscles Abdomen- bowel sounds present, soft, non tender Musculoskeletal- able to move all 4 extremities, generalized weakness, on wheelchair, limited bilateral shoulder ROM Neurological- alert and oriented to self and place only  Skin- warm and dry Psychiatry- normal mood and affect    Labs reviewed: Basic Metabolic Panel:  Recent Labs  11/01/16 0613 11/02/16 0522  11/03/16 0328 11/05/16 0540 11/05/16 1800 11/06/16 0413  NA 143 143  --  143  --   --    --   K 2.7* 3.1*  < > 3.7 3.1* 3.3* 3.4*  CL 102 107  --  110  --   --   --   CO2 31 29  --  26  --   --   --   GLUCOSE 155* 136*  --  137*  --   --   --   BUN <5* <5*  --  6  --   --   --   CREATININE 0.56 0.51  --  0.58  --   --   --   CALCIUM 9.1 8.2*  --  8.6*  --   --   --   MG 2.1  --   --  1.8 1.7  --  2.1  PHOS  --   --   --  2.1*  --   --   --   < > = values in this interval not displayed. Liver Function Tests:  Recent Labs  10/07/16 1805 10/11/16 2145 11/01/16 0613  AST 47* 46* 34  ALT 22 21 17   ALKPHOS 87 79 86  BILITOT 0.9 0.6 0.5  PROT 8.1 7.9 7.6  ALBUMIN 3.9 3.7 3.5    Recent Labs  09/17/16 1115 10/07/16 1805 10/11/16 2145  LIPASE 26 36 33   No results for input(s): AMMONIA in the last 8760 hours. CBC:  Recent Labs  07/04/16 1019  09/17/16 1115  10/11/16 2145  10/17/16 0402 11/01/16 0613 11/02/16 0522  WBC 9.8  < > 7.4  < > 7.1  < > 6.0 4.9 5.2  NEUTROABS 6.0  --  4.8  --  4.3  --   --   --   --   HGB 12.3  < > 12.0  < > 12.1  < > 11.8* 11.7* 10.7*  HCT 36.4  < > 36.3  < > 38.1  < > 36.1 36.1 33.2*  MCV 82.6  < > 83.5  < > 82.8  < > 82.8 82.3 82.6  PLT 287  < > 266  < > 279  < > 299 271 260  < > = values in this interval not displayed. Cardiac Enzymes:  Recent Labs  09/17/16 1115  09/18/16 0601 10/07/16 1805 10/11/16 2145  CKTOTAL 46  --   --   --   --   TROPONINI <0.03  < > <0.03 <0.03 <0.03  < > = values in this interval not displayed. BNP: Invalid input(s): POCBNP CBG:  Recent Labs  11/05/16 0736 11/06/16 0739 11/06/16 1215  GLUCAP 154* 138* 131*    Radiological Exams: Dg Chest 1 View  Result Date: 11/03/2016 CLINICAL DATA:  Short of breath EXAM: CHEST 1 VIEW COMPARISON:  11/01/2016 FINDINGS: CABG.  Dual lead pacemaker unchanged.  COPD Patchy airspace disease in left lung base has progressed and may represent pneumonia. No effusion. IMPRESSION: Left lower lobe airspace disease, possible pneumonia Electronically Signed    By: Franchot Gallo M.D.   On: 11/03/2016 16:27   Dg Chest 1 View  Result Date: 11/01/2016 CLINICAL DATA:  Nausea and vomiting beginning this morning. EXAM: CHEST 1 VIEW COMPARISON:  10/11/2016 FINDINGS: Left pacer remains in place, unchanged. Prior CABG. Low lung volumes. No confluent airspace opacities or effusions. No acute bony abnormality. Heart is upper limits normal in size. IMPRESSION: Low lung volumes.  No acute findings. Electronically Signed   By: Rolm Baptise M.D.   On: 11/01/2016 08:30   Dg Chest 2 View  Result Date: 10/11/2016 CLINICAL DATA:  Nausea and weakness EXAM: CHEST  2 VIEW COMPARISON:  10/07/2016 FINDINGS: Left-sided dual lead pacing device with leads over the right atrium and right ventricle. Post sternotomy changes. Possible tiny right pleural effusion. Stable enlarged cardiomediastinal none with tortuous aorta. Hazy bibasilar opacity likely attributable to overlying soft tissues. No  focal consolidation. No pneumothorax. IMPRESSION: 1. Stable degree of cardiomegaly.  No overt failure. 2. Possible tiny right pleural effusion 3. No focal consolidation Electronically Signed   By: Donavan Foil M.D.   On: 10/11/2016 21:43   Nm Hepatobiliary Liver Func  Result Date: 10/14/2016 CLINICAL DATA:  Patient with history of nausea. EXAM: NUCLEAR MEDICINE HEPATOBILIARY IMAGING TECHNIQUE: Sequential images of the abdomen were obtained out to 60 minutes following intravenous administration of radiopharmaceutical. RADIOPHARMACEUTICALS:  5.832 mCi Tc-28m  Choletec IV COMPARISON:  CT abdomen pelvis 10/11/2016 FINDINGS: Prompt uptake and biliary excretion of activity by the liver is seen. Gallbladder activity is visualized, consistent with patency of cystic duct. Biliary activity passes into small bowel, consistent with patent common bile duct. IMPRESSION: No evidence for cystic duct obstruction. Electronically Signed   By: Lovey Newcomer M.D.   On: 10/14/2016 09:48   Ct Abdomen Pelvis W  Contrast  Result Date: 10/11/2016 CLINICAL DATA:  Epigastric pain nausea and weakness EXAM: CT ABDOMEN AND PELVIS WITH CONTRAST TECHNIQUE: Multidetector CT imaging of the abdomen and pelvis was performed using the standard protocol following bolus administration of intravenous contrast. CONTRAST:  191mL ISOVUE-300 IOPAMIDOL (ISOVUE-300) INJECTION 61% COMPARISON:  09/17/2016 FINDINGS: Lower chest: Hazy density within the lingula could reflect atelectasis or a mild infiltrate. No effusion. Mild subpleural opacities at the right middle lobe are unchanged, possible mild fibrosis. Heart is enlarged. Partially visualized pacer leads. Calcified mitral annulus. Hepatobiliary: Diffuse decreased density of the liver, consistent with fatty infiltration. Liver is slightly enlarged at 17 cm craniocaudad. No focal hepatic abnormalities. Small amount of high density material is present within the gallbladder, which may reflect small stones or sludge. No biliary dilatation. Pancreas: Unremarkable. No pancreatic ductal dilatation or surrounding inflammatory changes.Choose Spleen: Normal in size without focal abnormality.Small accessory splenule. Adrenals/Urinary Tract: Adrenal glands within normal limits. Punctate nonobstructing stones within the kidneys. Cortical hypodense lesion within the left kidney too small to further characterize, grossly unchanged. Small amount of air within the bladder. Stomach/Bowel: The stomach is nondilated and contains possible small pill fragment. Duodenum lipoma unchanged. No dilated small bowel to suggest an obstruction. Mild colon diverticular disease without bowel inflammation. Transverse lie of the cecum. Appendix is normal. Vascular/Lymphatic: Atherosclerotic vascular calcifications but no aneurysm. No significantly enlarged lymph nodes. Reproductive: Uterus and bilateral adnexa are unremarkable. Other: No free air or free fluid Musculoskeletal: Patient is status post right hip replacement.  Multilevel degenerative changes of the spine with mild anterior listhesis of L4 on L5 and mild retrolisthesis of L5 on S1. Vacuum discs at L4-L5 and L5-S1. No acute osseous abnormality. IMPRESSION: 1. Hazy density in the lingula could reflect atelectasis or mild infiltrate. 2. Slightly enlarged fatty liver. 3. Small amount of stones or high density sludge within the gallbladder. No wall thickening. No biliary dilatation. 4. Punctate nonobstructing stones within both kidneys. 5. No evidence for small bowel obstruction. 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 Normal appendix. Electronically Signed   By: Donavan Foil M.D.   On: 10/11/2016 23:22   US Abdomen Limited Ruq  Result Date: 11/01/2016 CLINICAL DATA:  Nausea and vomiting for 2 weeks, most severe with fatty food consumption EXAM: US ABDOMEN LIMITED - RIGHT UPPER QUADRANT COMPARISON:  CT abdomen and pelvis October 11, 2016. FINDINGS: Gallbladder: Within the gallbladder, there are multiple small echogenic foci which move and shadow consistent with cholelithiasis. Largest individual gallstone measures 4 mm in length. There is no gallbladder wall thickening or pericholecystic fluid. No sonographic Murphy sign noted by  sonographer. Common bile duct: Diameter: 5 mm. There is no intrahepatic or extrahepatic biliary duct dilatation. Liver: No focal lesion identified. Liver echogenicity is overall increased diffusely. IMPRESSION: Cholelithiasis.  Gallbladder otherwise appears unremarkable. Increased liver echogenicity is felt to be consistent with hepatic steatosis. While no focal liver lesions are identified, it must be cautioned that the sensitivity of ultrasound for detection of focal liver lesions is diminished in this circumstance. Electronically Signed   By: Lowella Grip III M.D.   On: 11/01/2016 10:03    Assessment/Plan  Generalized weakness Will have her work with physical therapy and occupational therapy team to help with gait training and muscle  strengthening exercises.fall precautions. Skin care. Encourage to be out of bed.   Left lower lobe pneumonia Denies fever and cough. Breathing has improved. Continue levaquin until 11/09/16. Monitor wbc and temp curve  Chronic nausea and vomiting EGD normal. Workup in past negative. Possible gastroparesis. Continue reglan but change to 5 mg tid with meals x 2 weeks and then q8h prn and continue ondansetron 4 mg q8h prn nausea. monitor  Constipation With hard stool. On milk of magnesia daily as needed. Change this to daily for now.  Anemia of chronic disease Monitor cbc  Hyperlipidemia On atorvastatin, no changes made  afib Rate controlled. Continue diltiazem cd 180 mg daily. Continue aspirin.   OA Continue tylenol 650 mg tid as needed and norco 10-325 mg q4h prn pain. Continue calcium and vitamin d supplement  Chronic diastolic chf On lasix 20 mg qod. Monitor clinically.   Cognitive impairment Provide supportive care for now. SLP to evaluate and treat.  Lab Results  Component Value Date   TSH 1.644 09/20/2016   Fibromyalgia Continue current regimen of norco and tylenol, monitor   Goals of care: short term rehabilitation   Labs/tests ordered: cbc, bmp 11/13/16  Family/ staff Communication: reviewed care plan with patient and nursing supervisor    Blanchie Serve, MD Internal Medicine [place], [place] [address] Cell Phone (Monday-Friday 8 am - 5 pm): [phone] On Call: [phone] and follow prompts after 5 pm and on weekends Office Phone: [phone] Office Fax: [phone]  \

## 2016-11-13 ENCOUNTER — Non-Acute Institutional Stay (SKILLED_NURSING_FACILITY): Payer: Medicare Other | Admitting: Family

## 2016-11-13 DIAGNOSIS — G4700 Insomnia, unspecified: Secondary | ICD-10-CM | POA: Diagnosis not present

## 2016-11-13 DIAGNOSIS — F411 Generalized anxiety disorder: Secondary | ICD-10-CM

## 2016-11-13 DIAGNOSIS — R112 Nausea with vomiting, unspecified: Secondary | ICD-10-CM

## 2016-11-13 DIAGNOSIS — R410 Disorientation, unspecified: Secondary | ICD-10-CM | POA: Diagnosis not present

## 2016-11-13 LAB — BASIC METABOLIC PANEL
BUN: 8 mg/dL (ref 4–21)
Creatinine: 0.5 mg/dL (ref 0.5–1.1)
GLUCOSE: 123 mg/dL
POTASSIUM: 3.3 mmol/L — AB (ref 3.4–5.3)
Sodium: 143 mmol/L (ref 137–147)

## 2016-11-13 LAB — CBC AND DIFFERENTIAL
HEMATOCRIT: 34 % — AB (ref 36–46)
HEMOGLOBIN: 10.3 g/dL — AB (ref 12.0–16.0)
NEUTROS ABS: 41 /uL
PLATELETS: 297 10*3/uL (ref 150–399)
WBC: 4.6 10*3/mL

## 2016-11-13 MED ORDER — TRAZODONE HCL 50 MG PO TABS: 50.0000 mg | ORAL_TABLET | Freq: Every evening | ORAL | 3 refills | Status: DC | PRN

## 2016-11-13 NOTE — Progress Notes (Signed)
Location:  [facility] [place] Room Number: [phone]  Place of Service:  SNF (31) Provider: Jodi Kappes FNP-C   [name], MD  Patient Care Team: [name], MD as PCP - General (Internal Medicine) Minna Merritts, MD (Cardiology)  Extended Emergency Contact Information Primary Emergency Contact: Salina April, [place] [address] Johnnette Litter of [place] Phone: [phone] Relation: Nephew Secondary Emergency Contact: Knight,Shelia Address: [address]          [place], [place] [address] Johnnette Litter of [place] Phone: [phone] Mobile Phone: [phone] Relation: Niece  Code Status:  DNR  Goals of care: Advanced Directive information Advanced Directives 11/01/2016  Does Patient Have a Medical Advance Directive? Yes  Type of Advance Directive Out of facility DNR (pink MOST or yellow form)  Does patient want to make changes to medical advance directive? No - Patient declined  Copy of [place] in Chart? Yes  Would patient like information on creating a medical advance directive? No - Patient declined  Pre-existing out of facility DNR order (yellow form or pink MOST form) Yellow form placed in chart (order not valid for inpatient use)     Chief Complaint  Patient presents with  . Acute Visit    anxiety and insomnia     HPI:  Pt is a 82 y.o. female seen today at [facility] for an acute visit for evaluation of increased anxiety and insomnia. She is seen in her room today. She has intermittent confusion. She complains of nausea and vomiting. She has scheduled Reglan, Zofran and Promethazine as needed.  Facility Nurse reports patient was up all night yelling out "Nurse! Nurse !" Also states does not want to be left alone in the room.    Past Medical History:  Diagnosis Date  . Anemia   . Aortic stenosis, severe    a. s/p Magna Ease pericardial tissue valve size 21  mm replacement in 10/2011 for severe AS 11/12; b. echo 07/2015: EF 55-60% mod concentric LVH, GR1DD, LA mildly dilated, PASP 45 mm Hg  . Atrial tachycardia, paroxysmal (HCC)    a. with rate related LBBB.  . Cardiac pacemaker -st Judes    11/12  . Chronic diastolic heart failure ([place])    a. echo 2014: EF 55-60%, no RWMA, GR1DD, PASP 47 mm Hg; b. echo 07/2015: EF 55-60% mod concentric LVH, GR1DD, LA mildly dilated, PASP 45 mm Hg  . Complete heart block [facility]    a. s/p St Jude PPM 10/2011 (Ser # [id number]).  . Coronary artery disease    a. s/p 2v CABG 11/12 (VG-LAD, VG-OM1); b. Lexiscan 08/2015: low risk, no ischemia, EF 55-65%.  . Enthesopathy of hip region   . Esophageal reflux    followed by Dr.Seigal. stabilized with a combination of Nexium and Zantac  . Fibromyalgia   . HLD (hyperlipidemia)   . Hypertensive heart disease   . Knee joint replacement by other means   . Morbid obesity ([place])   . Neuralgia, neuritis, and radiculitis, unspecified   . Neuropathy ([place])   . Onychia and paronychia of toe   . Osteoarthrosis, unspecified whether generalized or localized, pelvic region and thigh    mainly in her back and knees  . PAF (paroxysmal atrial fibrillation) ([place])    a. brief episodes of AF previously noted on device interrogations.  Marland Kitchen RAD (reactive airway disease)    a. chronic  SOB  . Spinal stenosis   . Stress incontinence, female    followed by Dr.Cope  . Type II or unspecified type diabetes mellitus without mention of complication, not stated as uncontrolled    a. pt. reports that she is borderline   . Wide-complex tachycardia ([place])    a. Noted 4/10 - brief episode in ED. Noted again 4/21 in clinic->felt most likely to be atrial tach.   Past Surgical History:  Procedure Laterality Date  . AORTIC VALVE REPLACEMENT  10/17/2011   Procedure: AORTIC VALVE REPLACEMENT (AVR);  Surgeon: Melrose Nakayama, MD;  Location: [facility];  Service: Open Heart Surgery;  Laterality: N/A;  . CARDIAC  CATHETERIZATION  08/2009   50% stenosis distal left main, 50% stenosis ostial left circumflex.   Marland Kitchen CARDIAC CATHETERIZATION     at [facility]  . CORONARY ARTERY BYPASS GRAFT  10/17/2011   Procedure: CORONARY ARTERY BYPASS GRAFTING (CABG);  Surgeon: Melrose Nakayama, MD;  Location: [facility];  Service: Open Heart Surgery;  Laterality: N/A;  Times Two, using right leg greater saphenous vein harvested endoscopically  . ESOPHAGOGASTRODUODENOSCOPY (EGD) WITH PROPOFOL N/A 10/12/2016   Procedure: ESOPHAGOGASTRODUODENOSCOPY (EGD) WITH PROPOFOL;  Surgeon: Lucilla Lame, MD;  Location: [facility];  Service: Endoscopy;  Laterality: N/A;  . EYE SURGERY     IOL/ after cataracts removed- bilateral   . NASAL SINUS SURGERY  2009  . PERMANENT PACEMAKER INSERTION N/A 10/20/2011   Procedure: PERMANENT PACEMAKER INSERTION;  Surgeon: Thompson Grayer, MD;  Location: [facility] CATH LAB;  Service: Cardiovascular;  Laterality: N/A;  . REPLACEMENT TOTAL KNEE  11/2006   right knee  . TOTAL HIP ARTHROPLASTY  09/2010    Allergies  Allergen Reactions  . Citalopram Other (See Comments)    Reaction:  Altered mental status   . Cymbalta [Duloxetine Hcl] Other (See Comments)    Reaction:  Sedative for pt   . Imipramine Other (See Comments)    Reaction:  Unknown   . Proton Pump Inhibitors Other (See Comments)    Reaction:  Unknown   . Venlafaxine Nausea And Vomiting and Other (See Comments)    Reaction:  Dizziness       Medication List       Accurate as of 11/13/16  2:25 PM. Always use your most recent med list.          acetaminophen 325 MG tablet Commonly known as:  TYLENOL Take 650 mg by mouth 3 (three) times daily as needed.   acidophilus Caps capsule Take 1 capsule by mouth 2 (two) times daily.   aspirin EC 81 MG tablet Take 81 mg by mouth daily.   atorvastatin 80 MG tablet Commonly known as:  LIPITOR Take 80 mg by mouth daily.   Calcium Carbonate-Vitamin D 600-400 MG-UNIT tablet Take 1 tablet by mouth  daily.   Cholecalciferol 1000 units capsule Take 1,000 Units by mouth daily.   diltiazem 180 MG 24 hr capsule Commonly known as:  CARDIZEM CD Take 1 capsule (180 mg total) by mouth daily.   furosemide 20 MG tablet Commonly known as:  LASIX Take 1 tablet (20 mg total) by mouth every other day.   HYDROcodone-acetaminophen 10-325 MG tablet Commonly known as:  NORCO Take 1 tablet by mouth every 4 (four) hours as needed for moderate pain. Reported on 05/30/2016   magnesium hydroxide 400 MG/5ML suspension Commonly known as:  MILK OF MAGNESIA Take 10 mLs by mouth daily.   metoCLOPramide 5 MG tablet Commonly known as:  REGLAN Take 1 tablet (5 mg total) by mouth every 6 (six) hours as needed for nausea or vomiting.   nitroGLYCERIN 0.4 MG SL tablet Commonly known as:  NITROSTAT Place 1 tablet (0.4 mg total) under the tongue every 5 (five) minutes as needed for chest pain.   ondansetron 4 MG disintegrating tablet Commonly known as:  ZOFRAN ODT Take 1 tablet (4 mg total) by mouth every 8 (eight) hours as needed for nausea or vomiting.   promethazine 12.5 MG tablet Commonly known as:  PHENERGAN Take 1 tablet (12.5 mg total) by mouth every 6 (six) hours as needed for nausea or vomiting.       Review of Systems  Constitutional: Negative for activity change, appetite change, chills, fatigue and fever.  HENT: Negative for congestion, rhinorrhea, sinus pain, sinus pressure, sneezing and sore throat.   Eyes: Negative.   Respiratory: Negative for cough, chest tightness, shortness of breath and wheezing.   Cardiovascular: Negative for chest pain, palpitations and leg swelling.  Gastrointestinal: Positive for nausea and vomiting. Negative for abdominal distention, abdominal pain, constipation and diarrhea.  Endocrine: Negative.   Genitourinary: Negative for dysuria, flank pain, frequency and urgency.  Musculoskeletal: Positive for gait problem.  Skin: Negative for color change, pallor and  rash.  Neurological: Negative for dizziness, seizures, light-headedness and headaches.  Hematological: Does not bruise/bleed easily.  Psychiatric/Behavioral: Positive for confusion. Negative for agitation, hallucinations and sleep disturbance. The patient is nervous/anxious.     Immunization History  Administered Date(s) Administered  . Influenza,inj,Quad PF,36+ Mos 10/13/2016  . PPD Test 03/14/2016, 06/24/2016, 11/06/2016   Pertinent  Health Maintenance Due  Topic Date Due  . FOOT EXAM  05/28/1941  . OPHTHALMOLOGY EXAM  05/28/1941  . DEXA SCAN  05/27/1998  . PNA vac Low Risk Adult (1 of 2 - PCV13) 05/27/1998  . URINE MICROALBUMIN  07/18/2012  . HEMOGLOBIN A1C  03/18/2017  . INFLUENZA VACCINE  Completed      Vitals:   11/13/16 1115  BP: 122/74  Pulse: 74  Resp: 18  Temp: 98.2 F (36.8 C)  SpO2: 95%  Weight: 238 lb 12.8 oz (108.3 kg)  Height: 5\' 4"  (1.626 m)   Body mass index is 40.99 kg/m. Physical Exam  Constitutional: She appears well-developed and well-nourished. No distress.  HENT:  Head: Normocephalic.  Mouth/Throat: Oropharynx is clear and moist. No oropharyngeal exudate.  Eyes: Conjunctivae and EOM are normal. Pupils are equal, round, and reactive to light. Right eye exhibits no discharge. Left eye exhibits no discharge. No scleral icterus.  Neck: Normal range of motion. No JVD present. No thyromegaly present.  Cardiovascular: Normal rate, regular rhythm, normal heart sounds and intact distal pulses.  Exam reveals no gallop and no friction rub.   No murmur heard. Pulmonary/Chest: Effort normal and breath sounds normal. No respiratory distress. She has no wheezes. She has no rales.  Abdominal: Soft. Bowel sounds are normal. She exhibits no distension. There is no tenderness. There is no rebound and no guarding.  Musculoskeletal: Normal range of motion. She exhibits no edema, tenderness or deformity.  Unsteady gait   Lymphadenopathy:    She has no cervical  adenopathy.  Neurological: She is alert.  Intermittent confusion   Skin: Skin is warm and dry. No rash noted. No erythema. No pallor.  Psychiatric: She has a normal mood and affect.    Labs reviewed:  Recent Labs  11/01/16 0613 11/02/16 0522  11/03/16 0328 11/05/16 0540 11/05/16 1800 11/06/16 0413  NA 143  143  --  143  --   --   --   K 2.7* 3.1*  < > 3.7 3.1* 3.3* 3.4*  CL 102 107  --  110  --   --   --   CO2 31 29  --  26  --   --   --   GLUCOSE 155* 136*  --  137*  --   --   --   BUN <5* <5*  --  6  --   --   --   CREATININE 0.56 0.51  --  0.58  --   --   --   CALCIUM 9.1 8.2*  --  8.6*  --   --   --   MG 2.1  --   --  1.8 1.7  --  2.1  PHOS  --   --   --  2.1*  --   --   --   < > = values in this interval not displayed.  Recent Labs  10/07/16 1805 10/11/16 2145 11/01/16 0613  AST 47* 46* 34  ALT 22 21 17   ALKPHOS 87 79 86  BILITOT 0.9 0.6 0.5  PROT 8.1 7.9 7.6  ALBUMIN 3.9 3.7 3.5    Recent Labs  07/04/16 1019  09/17/16 1115  10/11/16 2145  10/17/16 0402 11/01/16 0613 11/02/16 0522  WBC 9.8  < > 7.4  < > 7.1  < > 6.0 4.9 5.2  NEUTROABS 6.0  --  4.8  --  4.3  --   --   --   --   HGB 12.3  < > 12.0  < > 12.1  < > 11.8* 11.7* 10.7*  HCT 36.4  < > 36.3  < > 38.1  < > 36.1 36.1 33.2*  MCV 82.6  < > 83.5  < > 82.8  < > 82.8 82.3 82.6  PLT 287  < > 266  < > 279  < > 299 271 260  < > = values in this interval not displayed. Lab Results  Component Value Date   TSH 1.644 09/20/2016   Lab Results  Component Value Date   HGBA1C 7.7 (H) 09/17/2016   Assessment/Plan 1. Generalized anxiety disorder Does not want to be left in the room by herself. Will try nonpharmacologic measures for now.continue to reassure. Monitor.   2. Insomnia, unspecified type Trazodone 50 mg Tablet one by mouth at bedtime as needed for insomnia.   3. Nausea and vomiting, intractability of vomiting not specified, unspecified vomiting type Continue on Reglan 5 mg Tablet three times  daily with meals. Continue on Zofran and promethazine. Facility Nurse to offer PRN antiemetic prior to meals.   4. Confusion Intermittent confusion noted. Will obtain urine specimen for U/A and C/S     Family/ staff Communication: Reviewed plan of care with facility Nurse and Nurse Supervisor.   Labs/tests ordered:   urine specimen for U/A and C/S

## 2016-11-16 ENCOUNTER — Non-Acute Institutional Stay (SKILLED_NURSING_FACILITY): Payer: Medicare Other | Admitting: Family

## 2016-11-16 DIAGNOSIS — R630 Anorexia: Secondary | ICD-10-CM

## 2016-11-16 DIAGNOSIS — F329 Major depressive disorder, single episode, unspecified: Secondary | ICD-10-CM

## 2016-11-16 DIAGNOSIS — R197 Diarrhea, unspecified: Secondary | ICD-10-CM | POA: Diagnosis not present

## 2016-11-16 DIAGNOSIS — F32A Depression, unspecified: Secondary | ICD-10-CM

## 2016-11-16 DIAGNOSIS — E876 Hypokalemia: Secondary | ICD-10-CM | POA: Diagnosis not present

## 2016-11-16 DIAGNOSIS — R112 Nausea with vomiting, unspecified: Secondary | ICD-10-CM

## 2016-11-16 LAB — BASIC METABOLIC PANEL
BUN: 6 mg/dL (ref 4–21)
CREATININE: 0.5 mg/dL (ref 0.5–1.1)
GLUCOSE: 134 mg/dL
POTASSIUM: 3.2 mmol/L — AB (ref 3.4–5.3)
Sodium: 147 mmol/L (ref 137–147)

## 2016-11-16 MED ORDER — MIRTAZAPINE 7.5 MG PO TABS: 7.5000 mg | ORAL_TABLET | Freq: Every day | ORAL | Status: DC

## 2016-11-16 NOTE — Progress Notes (Signed)
Location:  [name] Room Number: [phone] Place of Service:  SNF (31) Provider:  Troyce Febo FNP-C   [name], MD  Patient Care Team: [name], MD as PCP - General (Internal Medicine) Minna Merritts, MD (Cardiology)  Extended Emergency Contact Information Primary Emergency Contact: Salina April, [place] [address] Johnnette Litter of [place] Phone: [phone] Relation: Nephew Secondary Emergency Contact: Knight,Shelia Address: [address]          [place], [place] [address] Johnnette Litter of [place] Phone: [phone] Mobile Phone: [phone] Relation: Niece  Code Status:  DNR  Goals of care: Advanced Directive information Advanced Directives 11/01/2016  Does Patient Have a Medical Advance Directive? Yes  Type of Advance Directive Out of facility DNR (pink MOST or yellow form)  Does patient want to make changes to medical advance directive? No - Patient declined  Copy of [place] in Chart? Yes  Would patient like information on creating a medical advance directive? No - Patient declined  Pre-existing out of facility DNR order (yellow form or pink MOST form) Yellow form placed in chart (order not valid for inpatient use)     Chief Complaint  Patient presents with  . Acute Visit    N/V     HPI:  Pt is a 82 y.o. Michele Schultz seen today at [facility] for an acute visit for follow up nausea and vomiting. She has a medical history of  HTN, CHF, Afib, Aortic stenosis, hyperlipidemia among other conditions. She is seen in room today with her Niece and care giver at bedside. She continues to complain of N/V states every time she eats anything "it comes right back up". She states has never been sick in her life like this " I just want to go home and be with my husband in [place]". [name] states husband is dead. Patient also complains of lack of appetite.  Patient's sitter at bedside states patient hardly eat her breakfast this morning. Also states she has had diarrhea for several days. She denies any fever or chills.  Recent lab results showed K+ 3.3 ( 11/13/2016) potassium supplement ordered. BMP due for recheck today.    Past Medical History:  Diagnosis Date  . Anemia   . Aortic stenosis, severe    a. s/p Magna Ease pericardial tissue valve size 21 mm replacement in 10/2011 for severe AS 11/12; b. echo 07/2015: EF 55-60% mod concentric LVH, GR1DD, LA mildly dilated, PASP 45 mm Hg  . Atrial tachycardia, paroxysmal (HCC)    a. with rate related LBBB.  . Cardiac pacemaker -st Judes    11/12  . Chronic diastolic heart failure ([place])    a. echo 2014: EF 55-60%, no RWMA, GR1DD, PASP 47 mm Hg; b. echo 07/2015: EF 55-60% mod concentric LVH, GR1DD, LA mildly dilated, PASP 45 mm Hg  . Complete heart block [facility])    a. s/p St Jude PPM 10/2011 (Ser # [id number]).  . Coronary artery disease    a. s/p 2v CABG 11/12 (VG-LAD, VG-OM1); b. Lexiscan 08/2015: low risk, no ischemia, EF 55-65%.  . Enthesopathy of hip region   . Esophageal reflux    followed by Dr.Seigal. stabilized with a combination of Nexium and Zantac  . Fibromyalgia   . HLD (hyperlipidemia)   . Hypertensive heart disease   . Knee joint replacement by other means   . Morbid obesity ([place])   .  Neuralgia, neuritis, and radiculitis, unspecified   . Neuropathy ([place])   . Onychia and paronychia of toe   . Osteoarthrosis, unspecified whether generalized or localized, pelvic region and thigh    mainly in her back and knees  . PAF (paroxysmal atrial fibrillation) ([place])    a. brief episodes of AF previously noted on device interrogations.  Marland Kitchen RAD (reactive airway disease)    a. chronic SOB  . Spinal stenosis   . Stress incontinence, Michele Schultz    followed by Dr.Cope  . Type II or unspecified type diabetes mellitus without mention of complication, not stated as uncontrolled    a. pt. reports that she is  borderline   . Wide-complex tachycardia ([place])    a. Noted 4/10 - brief episode in ED. Noted again 4/21 in clinic->felt most likely to be atrial tach.   Past Surgical History:  Procedure Laterality Date  . AORTIC VALVE REPLACEMENT  10/17/2011   Procedure: AORTIC VALVE REPLACEMENT (AVR);  Surgeon: Melrose Nakayama, MD;  Location: [place];  Service: Open Heart Surgery;  Laterality: N/A;  . CARDIAC CATHETERIZATION  08/2009   50% stenosis distal left main, 50% stenosis ostial left circumflex.   Marland Kitchen CARDIAC CATHETERIZATION     at [facility]  . CORONARY ARTERY BYPASS GRAFT  10/17/2011   Procedure: CORONARY ARTERY BYPASS GRAFTING (CABG);  Surgeon: Melrose Nakayama, MD;  Location: [place];  Service: Open Heart Surgery;  Laterality: N/A;  Times Two, using right leg greater saphenous vein harvested endoscopically  . ESOPHAGOGASTRODUODENOSCOPY (EGD) WITH PROPOFOL N/A 10/12/2016   Procedure: ESOPHAGOGASTRODUODENOSCOPY (EGD) WITH PROPOFOL;  Surgeon: Lucilla Lame, MD;  Location: [facility] ENDOSCOPY;  Service: Endoscopy;  Laterality: N/A;  . EYE SURGERY     IOL/ after cataracts removed- bilateral   . NASAL SINUS SURGERY  2009  . PERMANENT PACEMAKER INSERTION N/A 10/20/2011   Procedure: PERMANENT PACEMAKER INSERTION;  Surgeon: Thompson Grayer, MD;  Location: [facility] CATH LAB;  Service: Cardiovascular;  Laterality: N/A;  . REPLACEMENT TOTAL KNEE  11/2006   right knee  . TOTAL HIP ARTHROPLASTY  09/2010    Allergies  Allergen Reactions  . Citalopram Other (See Comments)    Reaction:  Altered mental status   . Cymbalta [Duloxetine Hcl] Other (See Comments)    Reaction:  Sedative for pt   . Imipramine Other (See Comments)    Reaction:  Unknown   . Proton Pump Inhibitors Other (See Comments)    Reaction:  Unknown   . Venlafaxine Nausea And Vomiting and Other (See Comments)    Reaction:  Dizziness     Allergies as of 11/16/2016      Reactions   Citalopram Other (See Comments)   Reaction:  Altered mental status     Cymbalta [duloxetine Hcl] Other (See Comments)   Reaction:  Sedative for pt    Imipramine Other (See Comments)   Reaction:  Unknown    Proton Pump Inhibitors Other (See Comments)   Reaction:  Unknown    Venlafaxine Nausea And Vomiting, Other (See Comments)   Reaction:  Dizziness       Medication List       Accurate as of 11/16/16  6:11 PM. Always use your most recent med list.          acetaminophen 325 MG tablet Commonly known as:  TYLENOL Take 650 mg by mouth 3 (three) times daily as needed.   acidophilus Caps capsule Take 1 capsule by mouth 2 (two) times daily.   aspirin EC  81 MG tablet Take 81 mg by mouth daily.   atorvastatin 80 MG tablet Commonly known as:  LIPITOR Take 80 mg by mouth daily.   Calcium Carbonate-Vitamin D 600-400 MG-UNIT tablet Take 1 tablet by mouth daily.   Cholecalciferol 1000 units capsule Take 1,000 Units by mouth daily.   diltiazem 180 MG 24 hr capsule Commonly known as:  CARDIZEM CD Take 1 capsule (180 mg total) by mouth daily.   furosemide 20 MG tablet Commonly known as:  LASIX Take 1 tablet (20 mg total) by mouth every other day.   HYDROcodone-acetaminophen 10-325 MG tablet Commonly known as:  NORCO Take 1 tablet by mouth every 4 (four) hours as needed for moderate pain. Reported on 05/30/2016   magnesium hydroxide 400 MG/5ML suspension Commonly known as:  MILK OF MAGNESIA Take 10 mLs by mouth daily.   metoCLOPramide 5 MG tablet Commonly known as:  REGLAN Take 1 tablet (5 mg total) by mouth every 6 (six) hours as needed for nausea or vomiting.   mirtazapine 7.5 MG tablet Commonly known as:  REMERON Take 1 tablet (7.5 mg total) by mouth at bedtime.   nitroGLYCERIN 0.4 MG SL tablet Commonly known as:  NITROSTAT Place 1 tablet (0.4 mg total) under the tongue every 5 (five) minutes as needed for chest pain.   ondansetron 4 MG disintegrating tablet Commonly known as:  ZOFRAN ODT Take 1 tablet (4 mg total) by mouth every 8  (eight) hours as needed for nausea or vomiting.   potassium chloride 10 MEQ tablet Commonly known as:  K-DUR Take 10 mEq by mouth daily.   promethazine 12.5 MG tablet Commonly known as:  PHENERGAN Take 1 tablet (12.5 mg total) by mouth every 6 (six) hours as needed for nausea or vomiting.   traZODone 50 MG tablet Commonly known as:  DESYREL Take 1 tablet (50 mg total) by mouth at bedtime as needed for sleep.       Review of Systems  Constitutional: Positive for appetite change and fatigue. Negative for activity change, chills and fever.  HENT: Negative for congestion, rhinorrhea, sinus pain, sinus pressure, sneezing and sore throat.   Eyes: Negative.   Respiratory: Negative for cough, chest tightness, shortness of breath and wheezing.   Cardiovascular: Negative for chest pain, palpitations and leg swelling.  Gastrointestinal: Positive for diarrhea, nausea and vomiting. Negative for abdominal distention, abdominal pain and constipation.  Genitourinary: Negative for dysuria, flank pain, frequency and urgency.  Musculoskeletal: Positive for gait problem.  Skin: Negative for color change, pallor and rash.  Neurological: Negative for dizziness, seizures, light-headedness and headaches.  Hematological: Does not bruise/bleed easily.  Psychiatric/Behavioral: Negative for agitation, hallucinations and sleep disturbance. The patient is not nervous/anxious.        Despair     Immunization History  Administered Date(s) Administered  . Influenza,inj,Quad PF,36+ Mos 10/13/2016  . PPD Test 03/14/2016, 06/24/2016, 11/06/2016   Pertinent  Health Maintenance Due  Topic Date Due  . FOOT EXAM  05/28/1941  . OPHTHALMOLOGY EXAM  05/28/1941  . DEXA SCAN  05/27/1998  . PNA vac Low Risk Adult (1 of 2 - PCV13) 05/27/1998  . URINE MICROALBUMIN  07/18/2012  . HEMOGLOBIN A1C  03/18/2017  . INFLUENZA VACCINE  Completed      Vitals:   11/16/16 1015  BP: 130/70  Pulse: 80  Resp: 18  Temp: 98.8  F (37.1 C)  SpO2: 97%  Weight: 246 lb (111.6 kg)  Height: 5\' 4"  (1.626 m)   Body  mass index is 42.23 kg/m. Physical Exam  Constitutional: She appears well-developed and well-nourished. No distress.  HENT:  Head: Normocephalic.  Mouth/Throat: Oropharynx is clear and moist. No oropharyngeal exudate.  Eyes: Conjunctivae and EOM are normal. Pupils are equal, round, and reactive to light. Right eye exhibits no discharge. Left eye exhibits no discharge. No scleral icterus.  Neck: Normal range of motion. No JVD present. No thyromegaly present.  Cardiovascular: Normal rate, regular rhythm, normal heart sounds and intact distal pulses.  Exam reveals no gallop and no friction rub.   No murmur heard. Pulmonary/Chest: Effort normal and breath sounds normal. No respiratory distress. She has no wheezes. She has no rales.  Abdominal: Soft. Bowel sounds are normal. She exhibits no distension. There is no tenderness. There is no rebound and no guarding.  Musculoskeletal: Normal range of motion. She exhibits no edema, tenderness or deformity.  Unsteady gait. Generalized weakness.   Lymphadenopathy:    She has no cervical adenopathy.  Neurological: She is alert.  Intermittent confusion   Skin: Skin is warm and dry. No rash noted. No erythema. No pallor.  Psychiatric:  Despair. No suicidal ideation.     Labs reviewed:  Recent Labs  11/01/16 0613 11/02/16 0522  11/03/16 0328 11/05/16 0540 11/05/16 1800 11/06/16 0413 11/13/16  NA 143 143  --  143  --   --   --  143  K 2.7* 3.1*  < > 3.7 3.1* 3.3* 3.4* 3.3*  CL 102 107  --  110  --   --   --   --   CO2 31 29  --  26  --   --   --   --   GLUCOSE 155* 136*  --  137*  --   --   --   --   BUN <5* <5*  --  6  --   --   --  8  CREATININE 0.56 0.51  --  0.58  --   --   --  0.5  CALCIUM 9.1 8.2*  --  8.6*  --   --   --   --   MG 2.1  --   --  1.8 1.7  --  2.1  --   PHOS  --   --   --  2.1*  --   --   --   --   < > = values in this interval not  displayed.  Recent Labs  10/07/16 1805 10/11/16 2145 11/01/16 0613  AST 47* 46* 34  ALT 22 21 17   ALKPHOS 87 79 86  BILITOT 0.9 0.6 0.5  PROT 8.1 7.9 7.6  ALBUMIN 3.9 3.7 3.5    Recent Labs  09/17/16 1115  10/11/16 2145  10/17/16 0402 11/01/16 0613 11/02/16 0522 11/13/16  WBC 7.4  < > 7.1  < > 6.0 4.9 5.2 4.6  NEUTROABS 4.8  --  4.3  --   --   --   --  41  HGB 12.0  < > 12.1  < > 11.8* 11.7* 10.7* 10.3*  HCT 36.3  < > 38.1  < > 36.1 36.1 33.2* 34*  MCV 83.5  < > 82.8  < > 82.8 82.3 82.6  --   PLT 266  < > 279  < > 299 271 260 297  < > = values in this interval not displayed. Lab Results  Component Value Date   TSH 1.644 09/20/2016   Lab Results  Component Value Date  HGBA1C 7.7 (H) 09/17/2016   Assessment/Plan 1. Non-intractable vomiting with nausea, unspecified vomiting type Status post hospital admission from 11/01/16-11/06/16 with nausea and vomiting. She has had some outpatient workup with EGD which have been unrevealing. Zofran, Promethazine and Reglan ineffective. Will refer to GI for further  evaluation.    2. Loss of appetite She reports no appetite willing to start on appetite stimulant. Will initiate Remeron 7.5 mg Tablet at bedtime titrate if needed.   3. Depression, unspecified depression type Verbalized wanting to die to be with deceased husband. Will initiate Remeron. Consult Psychiatry and Psychotherapist for evaluation.No suicide ideation. Has sitter at bedside. Family supportive. Continue to monitor.    4. Diarrhea  Obtain stool specimen for C-diff   5. Hypokalemia  Recent K+ 3.3 Had KCL 40 meq X 1 dose. Now on KCL 60meq Tablet daily. BMP ordered for recheck 11/16/2016. Awaiting results.    Family/ staff Communication: Reviewed plan of care with patient, [name] and facility Nurse supervisor.   Labs/tests ordered:  stool specimen for C-diff

## 2016-11-19 ENCOUNTER — Emergency Department: Payer: Medicare Other

## 2016-11-19 ENCOUNTER — Emergency Department
Admission: EM | Admit: 2016-11-19 | Discharge: 2016-11-19 | Disposition: A | Discharge status: Home / Self Care | Payer: Medicare Other | Attending: Emergency Medicine | Admitting: Emergency Medicine

## 2016-11-19 DIAGNOSIS — M25511 Pain in right shoulder: Secondary | ICD-10-CM

## 2016-11-19 DIAGNOSIS — E119 Type 2 diabetes mellitus without complications: Secondary | ICD-10-CM | POA: Diagnosis not present

## 2016-11-19 DIAGNOSIS — Z7982 Long term (current) use of aspirin: Secondary | ICD-10-CM | POA: Insufficient documentation

## 2016-11-19 DIAGNOSIS — R410 Disorientation, unspecified: Secondary | ICD-10-CM | POA: Insufficient documentation

## 2016-11-19 DIAGNOSIS — I2510 Atherosclerotic heart disease of native coronary artery without angina pectoris: Secondary | ICD-10-CM | POA: Diagnosis not present

## 2016-11-19 DIAGNOSIS — Y999 Unspecified external cause status: Secondary | ICD-10-CM | POA: Diagnosis not present

## 2016-11-19 DIAGNOSIS — M25551 Pain in right hip: Secondary | ICD-10-CM | POA: Insufficient documentation

## 2016-11-19 DIAGNOSIS — W06XXXA Fall from bed, initial encounter: Secondary | ICD-10-CM | POA: Insufficient documentation

## 2016-11-19 DIAGNOSIS — M25561 Pain in right knee: Secondary | ICD-10-CM | POA: Diagnosis not present

## 2016-11-19 DIAGNOSIS — Y9289 Other specified places as the place of occurrence of the external cause: Secondary | ICD-10-CM | POA: Diagnosis not present

## 2016-11-19 DIAGNOSIS — Z951 Presence of aortocoronary bypass graft: Secondary | ICD-10-CM | POA: Insufficient documentation

## 2016-11-19 DIAGNOSIS — Z79899 Other long term (current) drug therapy: Secondary | ICD-10-CM | POA: Diagnosis not present

## 2016-11-19 DIAGNOSIS — I5032 Chronic diastolic (congestive) heart failure: Secondary | ICD-10-CM | POA: Diagnosis not present

## 2016-11-19 DIAGNOSIS — I110 Hypertensive heart disease with heart failure: Secondary | ICD-10-CM | POA: Insufficient documentation

## 2016-11-19 DIAGNOSIS — S4991XA Unspecified injury of right shoulder and upper arm, initial encounter: Secondary | ICD-10-CM | POA: Diagnosis present

## 2016-11-19 DIAGNOSIS — Z950 Presence of cardiac pacemaker: Secondary | ICD-10-CM | POA: Insufficient documentation

## 2016-11-19 DIAGNOSIS — Y939 Activity, unspecified: Secondary | ICD-10-CM | POA: Insufficient documentation

## 2016-11-19 DIAGNOSIS — W19XXXA Unspecified fall, initial encounter: Secondary | ICD-10-CM

## 2016-11-19 LAB — CBC WITH DIFFERENTIAL/PLATELET
Basophils Absolute: 0 10*3/uL (ref 0–0.1)
Basophils Relative: 1 %
EOS PCT: 4 %
Eosinophils Absolute: 0.2 10*3/uL (ref 0–0.7)
HEMATOCRIT: 35.6 % (ref 35.0–47.0)
Hemoglobin: 11.4 g/dL — ABNORMAL LOW (ref 12.0–16.0)
LYMPHS PCT: 27 %
Lymphs Abs: 1.3 10*3/uL (ref 1.0–3.6)
MCH: 26.1 pg (ref 26.0–34.0)
MCHC: 32 g/dL (ref 32.0–36.0)
MCV: 81.7 fL (ref 80.0–100.0)
MONO ABS: 0.5 10*3/uL (ref 0.2–0.9)
MONOS PCT: 11 %
NEUTROS ABS: 2.7 10*3/uL (ref 1.4–6.5)
Neutrophils Relative %: 57 %
PLATELETS: 320 10*3/uL (ref 150–440)
RBC: 4.35 MIL/uL (ref 3.80–5.20)
RDW: 17.1 % — AB (ref 11.5–14.5)
WBC: 4.8 10*3/uL (ref 3.6–11.0)

## 2016-11-19 LAB — BASIC METABOLIC PANEL
ANION GAP: 10 (ref 5–15)
BUN: 6 mg/dL (ref 6–20)
CALCIUM: 9.5 mg/dL (ref 8.9–10.3)
CO2: 29 mmol/L (ref 22–32)
Chloride: 102 mmol/L (ref 101–111)
Creatinine, Ser: 0.54 mg/dL (ref 0.44–1.00)
GFR calc Af Amer: >60 mL/min (ref >60)
GFR calc non Af Amer: >60 mL/min (ref >60)
Glucose, Bld: 140 mg/dL — ABNORMAL HIGH (ref 65–99)
POTASSIUM: 3.3 mmol/L — AB (ref 3.5–5.1)
Sodium: 141 mmol/L (ref 135–145)

## 2016-11-19 LAB — URINALYSIS, COMPLETE (UACMP) WITH MICROSCOPIC
Bilirubin Urine: NEGATIVE
GLUCOSE, UA: NEGATIVE mg/dL
KETONES UR: NEGATIVE mg/dL
LEUKOCYTES UA: NEGATIVE
Nitrite: NEGATIVE
PH: 8 (ref 5.0–8.0)
Protein, ur: NEGATIVE mg/dL
Specific Gravity, Urine: 1.006 (ref 1.005–1.030)

## 2016-11-19 LAB — TROPONIN I: Troponin I: <0.03 ng/mL (ref <0.03)

## 2016-11-19 LAB — CK: Total CK: 25 U/L — ABNORMAL LOW (ref 38–234)

## 2016-11-19 MED ORDER — OXYCODONE-ACETAMINOPHEN 5-325 MG PO TABS
ORAL_TABLET | ORAL | Status: AC
  Administered 2016-11-19: 1 via ORAL
  Filled 2016-11-19: qty 1

## 2016-11-19 MED ORDER — ACETAMINOPHEN 325 MG PO TABS
650.0000 mg | ORAL_TABLET | Freq: Once | ORAL | Status: AC
  Administered 2016-11-19: 650 mg via ORAL
  Filled 2016-11-19: qty 2

## 2016-11-19 MED ORDER — OXYCODONE-ACETAMINOPHEN 5-325 MG PO TABS
1.0000 | ORAL_TABLET | Freq: Once | ORAL | Status: AC
  Administered 2016-11-19: 1 via ORAL

## 2016-11-19 NOTE — ED Notes (Signed)
Pt asleep, NAD. Equal rise and fall of chest.

## 2016-11-19 NOTE — ED Provider Notes (Addendum)
-----------------------------------------   7:34 AM on 11/19/2016 -----------------------------------------  Patient in no acute distress, multiple imaging studies have been reassuring. According to Dr. Dineen Kid, if blood work and urine and imaging are negative patient is to be discharged. She is in no acute distress at this time she has no complaints except for some being sore from her fall. Abdomen is benign. No tenderness to palpation. Lungs are air. Patient awake and alert. We will discharge her following Dr. Dineen Kid plan     Schuyler Amor, MD 11/19/16 [place], MD 11/19/16 [phone]

## 2016-11-19 NOTE — ED Provider Notes (Signed)
[facility] Emergency Department Provider Note  ____________________________________________   First MD Initiated Contact with Patient 11/19/16 [phone]     (approximate)  I have reviewed the triage vital signs and the nursing notes.   HISTORY  Chief Complaint Fall   HPI Michele Schultz is a 82 y.o. female with a history of anemia as well as aortic stenosis and CHF was present emergency department after a fall. The patient was found next to her bed. She does not remember the exact events leading up to the fall but the thought was that she had slid off her bed onto the floor. The patient is complaining of right shoulder pain as well as right hip and right knee pain. Although the patient was alert and oriented 4 for EMS the staff at her care facility thought her to be slightly confused compared to her baseline.   Past Medical History:  Diagnosis Date  . Anemia   . Aortic stenosis, severe    a. s/p Magna Ease pericardial tissue valve size 21 mm replacement in 10/2011 for severe AS 11/12; b. echo 07/2015: EF 55-60% mod concentric LVH, GR1DD, LA mildly dilated, PASP 45 mm Hg  . Atrial tachycardia, paroxysmal (HCC)    a. with rate related LBBB.  . Cardiac pacemaker -st Judes    11/12  . Chronic diastolic heart failure ([place])    a. echo 2014: EF 55-60%, no RWMA, GR1DD, PASP 47 mm Hg; b. echo 07/2015: EF 55-60% mod concentric LVH, GR1DD, LA mildly dilated, PASP 45 mm Hg  . Complete heart block [facility])    a. s/p St Jude PPM 10/2011 (Ser # [id number]).  . Coronary artery disease    a. s/p 2v CABG 11/12 (VG-LAD, VG-OM1); b. Lexiscan 08/2015: low risk, no ischemia, EF 55-65%.  . Enthesopathy of hip region   . Esophageal reflux    followed by Dr.Seigal. stabilized with a combination of Nexium and Zantac  . Fibromyalgia   . HLD (hyperlipidemia)   . Hypertensive heart disease   . Knee joint replacement by other means   . Morbid obesity ([place])   . Neuralgia, neuritis, and  radiculitis, unspecified   . Neuropathy ([place])   . Onychia and paronychia of toe   . Osteoarthrosis, unspecified whether generalized or localized, pelvic region and thigh    mainly in her back and knees  . PAF (paroxysmal atrial fibrillation) ([place])    a. brief episodes of AF previously noted on device interrogations.  Marland Kitchen RAD (reactive airway disease)    a. chronic SOB  . Spinal stenosis   . Stress incontinence, female    followed by Dr.Cope  . Type II or unspecified type diabetes mellitus without mention of complication, not stated as uncontrolled    a. pt. reports that she is borderline   . Wide-complex tachycardia ([place])    a. Noted 4/10 - brief episode in ED. Noted again 4/21 in clinic->felt most likely to be atrial tach.    Patient Active Problem List   Diagnosis Date Noted  . Adjustment disorder with depressed mood 11/02/2016  . Nausea & vomiting 11/01/2016  . Nausea and vomiting   . Atrial fibrillation with RVR ([place]) 10/11/2016  . Hypokalemia 10/11/2016  . DNR (do not resuscitate) 09/19/2016  . Palliative care by specialist 09/19/2016  . Muscle weakness (generalized)   . Atrial tachycardia ([place])   . Diarrhea 09/17/2016  . Dehydration 09/17/2016  . Metabolic acidosis [id number]  . Headache due to trauma 07/03/2016  .  Hypertensive heart disease   . Wide-complex tachycardia ([place])   . Pain in the chest   . Emesis   . Weakness of both legs   . Coronary artery disease involving coronary bypass graft of native heart with unspecified angina pectoris   . S/P AVR (aortic valve replacement)   . Falls frequently   . HLD (hyperlipidemia)   . Fibromyalgia   . RAD (reactive airway disease)   . Morbid obesity ([place])   . Aortic stenosis, severe   . Chronic pain 06/17/2014  . Pain in the muscles 06/19/2013  . Chest pain 06/19/2013  . Wheezing 06/19/2013  . Weakness generalized 06/03/2013  . Dyspnea 01/27/2013  . Nausea 03/05/2012  . Constipation 03/05/2012  . Atrial  fibrillation-non sustained 02/09/2012  . Cardiac pacemaker -st Judes   . Chronic diastolic heart failure ([place])   . Coronary artery disease   . Long term (current) use of anticoagulants 01/03/2012  . S/P CABG x 2 11/14/2011  . S/P aortic valve replacement with porcine valve 11/14/2011  . Back pain 11/14/2011  . Complete heart block ([place]) 10/20/2011  . HTN (hypertension) 09/15/2011  . Spinal stenosis 03/22/2011  . Fatigue 03/22/2011  . Hyperlipidemia 03/22/2011    Past Surgical History:  Procedure Laterality Date  . AORTIC VALVE REPLACEMENT  10/17/2011   Procedure: AORTIC VALVE REPLACEMENT (AVR);  Surgeon: Melrose Nakayama, MD;  Location: [place];  Service: Open Heart Surgery;  Laterality: N/A;  . CARDIAC CATHETERIZATION  08/2009   50% stenosis distal left main, 50% stenosis ostial left circumflex.   Marland Kitchen CARDIAC CATHETERIZATION     at [facility]  . CORONARY ARTERY BYPASS GRAFT  10/17/2011   Procedure: CORONARY ARTERY BYPASS GRAFTING (CABG);  Surgeon: Melrose Nakayama, MD;  Location: [place];  Service: Open Heart Surgery;  Laterality: N/A;  Times Two, using right leg greater saphenous vein harvested endoscopically  . ESOPHAGOGASTRODUODENOSCOPY (EGD) WITH PROPOFOL N/A 10/12/2016   Procedure: ESOPHAGOGASTRODUODENOSCOPY (EGD) WITH PROPOFOL;  Surgeon: Lucilla Lame, MD;  Location: [facility] ENDOSCOPY;  Service: Endoscopy;  Laterality: N/A;  . EYE SURGERY     IOL/ after cataracts removed- bilateral   . NASAL SINUS SURGERY  2009  . PERMANENT PACEMAKER INSERTION N/A 10/20/2011   Procedure: PERMANENT PACEMAKER INSERTION;  Surgeon: Thompson Grayer, MD;  Location: [facility] CATH LAB;  Service: Cardiovascular;  Laterality: N/A;  . REPLACEMENT TOTAL KNEE  11/2006   right knee  . TOTAL HIP ARTHROPLASTY  09/2010    Prior to Admission medications   Medication Sig Start Date End Date Taking? Authorizing Provider  acetaminophen (TYLENOL) 325 MG tablet Take 650 mg by mouth 3 (three) times daily as needed.    Historical  Provider, MD  acidophilus (RISAQUAD) CAPS capsule Take 1 capsule by mouth 2 (two) times daily. 06/26/16   Bettey Costa, MD  aspirin EC 81 MG tablet Take 81 mg by mouth daily.    Historical Provider, MD  atorvastatin (LIPITOR) 80 MG tablet Take 80 mg by mouth daily.    Historical Provider, MD  Calcium Carbonate-Vitamin D 600-400 MG-UNIT tablet Take 1 tablet by mouth daily.    Historical Provider, MD  Cholecalciferol 1000 units capsule Take 1,000 Units by mouth daily.    Historical Provider, MD  diltiazem (CARDIZEM CD) 180 MG 24 hr capsule Take 1 capsule (180 mg total) by mouth daily. 11/06/16   Gladstone Lighter, MD  furosemide (LASIX) 20 MG tablet Take 1 tablet (20 mg total) by mouth every other day. 04/06/16  Rogelia Mire, NP  HYDROcodone-acetaminophen (NORCO) 10-325 MG tablet Take 1 tablet by mouth every 4 (four) hours as needed for moderate pain. Reported on 05/30/2016 11/03/16   Epifanio Lesches, MD  magnesium hydroxide (MILK OF MAGNESIA) 400 MG/5ML suspension Take 10 mLs by mouth daily.     Historical Provider, MD  metoCLOPramide (REGLAN) 5 MG tablet Take 1 tablet (5 mg total) by mouth every 6 (six) hours as needed for nausea or vomiting. Patient taking differently: Take 5 mg by mouth 3 (three) times daily before meals.  09/21/16   Vaughan Basta, MD  mirtazapine (REMERON) 7.5 MG tablet Take 1 tablet (7.5 mg total) by mouth at bedtime. 11/16/16   Dinah C Ngetich, NP  nitroGLYCERIN (NITROSTAT) 0.4 MG SL tablet Place 1 tablet (0.4 mg total) under the tongue every 5 (five) minutes as needed for chest pain. 01/24/13   Minna Merritts, MD  ondansetron (ZOFRAN ODT) 4 MG disintegrating tablet Take 1 tablet (4 mg total) by mouth every 8 (eight) hours as needed for nausea or vomiting. 11/06/16   Gladstone Lighter, MD  potassium chloride (K-DUR) 10 MEQ tablet Take 10 mEq by mouth daily.    Historical Provider, MD  promethazine (PHENERGAN) 12.5 MG tablet Take 1 tablet (12.5 mg total) by mouth  every 6 (six) hours as needed for nausea or vomiting. 10/16/16   Nicholes Mango, MD  traZODone (DESYREL) 50 MG tablet Take 1 tablet (50 mg total) by mouth at bedtime as needed for sleep. 11/13/16   Dinah C Ngetich, NP    Allergies Citalopram; Cymbalta [duloxetine hcl]; Imipramine; Proton pump inhibitors; and Venlafaxine  Family History  Problem Relation Age of Onset  . Heart disease Sister   . Heart disease Brother   . Heart disease Brother   . Heart disease Brother   . Diabetes Other   . Anesthesia problems Neg Hx   . Hypotension Neg Hx   . Malignant hyperthermia Neg Hx   . Pseudochol deficiency Neg Hx     Social History Social History  Substance Use Topics  . Smoking status: Never Smoker  . Smokeless tobacco: Never Used  . Alcohol use No    Review of Systems Constitutional: No fever/chills Eyes: No visual changes. ENT: No sore throat. Cardiovascular: Denies chest pain. Respiratory: Denies shortness of breath. Gastrointestinal: No abdominal pain.  No nausea, no vomiting.  No diarrhea.  No constipation. Genitourinary: Negative for dysuria. Musculoskeletal: Negative for back pain. Skin: Negative for rash. Neurological: Negative for headaches, focal weakness or numbness.  10-point ROS otherwise negative.  ____________________________________________   PHYSICAL EXAM:  VITAL SIGNS: ED Triage Vitals  Enc Vitals Group     BP 11/19/16 0439 (!) 162/87     Pulse Rate 11/19/16 0439 91     Resp 11/19/16 0439 (!) 22     Temp 11/19/16 0439 99.1 F (37.3 C)     Temp Source 11/19/16 0439 Oral     SpO2 11/19/16 0439 97 %     Weight 11/19/16 0439 240 lb (108.9 kg)     Height 11/19/16 0439 5\' 4"  (1.626 m)     Head Circumference --      Peak Flow --      Pain Score 11/19/16 0445 6     Pain Loc --      Pain Edu? --      Excl. in [place]? --     Constitutional: Alert and oriented. Well appearing and in no acute distress. Eyes: Conjunctivae are normal.  PERRL. EOMI. Head:  Atraumatic. Nose: No congestion/rhinnorhea. Mouth/Throat: Mucous membranes are moist.   Neck: No stridor.  Tenderness to midline cervical spine. No deformity or step-off. Cardiovascular: Normal rate, regular rhythm. Grossly normal heart sounds.   Respiratory: Normal respiratory effort.  No retractions. Lungs CTAB. Gastrointestinal: Soft and nontender. No distention.  Musculoskeletal: Mild tenderness to palpation to the lateral right shoulder but with smooth range of passive motion but with pain on adduction. No deformity. Right hip with mild tenderness anteriorly. No shortening or rotation of the right lower extremity. Right knee with mild diffuse tenderness without any effusion. No ligamentous laxity. No swelling or ecchymosis. Neurologic:  Normal speech and language. No gross focal neurologic deficits are appreciated.  Skin:  Skin is warm, dry and intact. No rash noted. Psychiatric: Mood and affect are normal. Speech and behavior are normal.  ____________________________________________   LABS (all labs ordered are listed, but only abnormal results are displayed)  Labs Reviewed  CBC WITH DIFFERENTIAL/PLATELET  BASIC METABOLIC PANEL  TROPONIN I  URINALYSIS, COMPLETE (UACMP) WITH MICROSCOPIC   ____________________________________________  EKG  ED ECG REPORT I, Jadence Kinlaw,  Youlanda Roys, the attending physician, personally viewed and interpreted this ECG.   Date: 11/19/2016  EKG Time: 0526  Rate: 83  Rhythm: normal sinus rhythm.  PVC times one.  Axis: normal axis  Intervals:none  ST&T Change: No ST segment elevation or depression. No abnormal T-wave inversion.  ____________________________________________  RADIOLOGY  [place]ing imaging results. ____________________________________________   PROCEDURES  Procedure(s) performed:   Procedures  Critical Care performed:   ____________________________________________   INITIAL IMPRESSION / ASSESSMENT AND PLAN / ED  COURSE  Pertinent labs & imaging results that were available during my care of the patient were reviewed by me and considered in my medical decision making (see chart for details).   Clinical Course   ----------------------------------------- 5:56 AM on 11/19/2016 -----------------------------------------  Patient pending imaging as well as lab results at this time. Signed out to Dr. Burlene Arnt.   ____________________________________________   FINAL CLINICAL IMPRESSION(S) / ED DIAGNOSES  Fall. Right-sided shoulder, hip and knee pain.    NEW MEDICATIONS STARTED DURING THIS VISIT:  New Prescriptions   No medications on file     Note:  This document was prepared using Dragon voice recognition software and may include unintentional dictation errors.    Orbie Pyo, MD 11/19/16 [phone]

## 2016-11-19 NOTE — ED Notes (Signed)
Pt offered food or drink. Declined. Warm blanket given. Waiting EMS transport back to nursing home.

## 2016-11-19 NOTE — ED Triage Notes (Signed)
Pt had unwitnessed fall from low bed onto fall mat at rehab facility. Pt recently hospitalized w/ pneumonia. Pt is reported to be slightly confused, A&O x3. Pt reported to be getting up to use bathroom, something that is uncharacteristic of her per staff of [facility] report to EMS. Pt denies CP and SHOB at this time Pt denies n/v/d at this time, but does c/o generalized aches as well as pain on R side from fall.

## 2016-11-20 LAB — BASIC METABOLIC PANEL
BUN: 8 mg/dL (ref 4–21)
Creatinine: 0.5 mg/dL (ref 0.5–1.1)
Glucose: 116 mg/dL
Potassium: 3.5 mmol/L (ref 3.4–5.3)
Sodium: 145 mmol/L (ref 137–147)

## 2016-11-21 ENCOUNTER — Emergency Department: Payer: Medicare Other

## 2016-11-21 ENCOUNTER — Emergency Department
Admission: EM | Admit: 2016-11-21 | Discharge: 2016-11-21 | Disposition: A | Discharge status: Home / Self Care | Payer: Medicare Other | Attending: Emergency Medicine | Admitting: Emergency Medicine

## 2016-11-21 DIAGNOSIS — Z79899 Other long term (current) drug therapy: Secondary | ICD-10-CM | POA: Insufficient documentation

## 2016-11-21 DIAGNOSIS — I5032 Chronic diastolic (congestive) heart failure: Secondary | ICD-10-CM | POA: Insufficient documentation

## 2016-11-21 DIAGNOSIS — S4991XA Unspecified injury of right shoulder and upper arm, initial encounter: Secondary | ICD-10-CM | POA: Diagnosis present

## 2016-11-21 DIAGNOSIS — Y929 Unspecified place or not applicable: Secondary | ICD-10-CM | POA: Diagnosis not present

## 2016-11-21 DIAGNOSIS — W06XXXA Fall from bed, initial encounter: Secondary | ICD-10-CM | POA: Diagnosis not present

## 2016-11-21 DIAGNOSIS — M25551 Pain in right hip: Secondary | ICD-10-CM | POA: Insufficient documentation

## 2016-11-21 DIAGNOSIS — R51 Headache: Secondary | ICD-10-CM | POA: Insufficient documentation

## 2016-11-21 DIAGNOSIS — Y939 Activity, unspecified: Secondary | ICD-10-CM | POA: Diagnosis not present

## 2016-11-21 DIAGNOSIS — Y999 Unspecified external cause status: Secondary | ICD-10-CM | POA: Insufficient documentation

## 2016-11-21 DIAGNOSIS — I110 Hypertensive heart disease with heart failure: Secondary | ICD-10-CM | POA: Diagnosis not present

## 2016-11-21 DIAGNOSIS — M25511 Pain in right shoulder: Secondary | ICD-10-CM | POA: Diagnosis not present

## 2016-11-21 DIAGNOSIS — W19XXXA Unspecified fall, initial encounter: Secondary | ICD-10-CM

## 2016-11-21 DIAGNOSIS — I2510 Atherosclerotic heart disease of native coronary artery without angina pectoris: Secondary | ICD-10-CM | POA: Insufficient documentation

## 2016-11-21 MED ORDER — ACETAMINOPHEN 500 MG PO TABS
ORAL_TABLET | ORAL | Status: AC
  Administered 2016-11-21: 1000 mg via ORAL
  Filled 2016-11-21: qty 2

## 2016-11-21 MED ORDER — ACETAMINOPHEN 500 MG PO TABS
1000.0000 mg | ORAL_TABLET | Freq: Once | ORAL | Status: AC
  Administered 2016-11-21: 1000 mg via ORAL

## 2016-11-21 NOTE — ED Notes (Signed)
Pt states she needs to use restroom, informed that this RN will help pt to toilet with another person assist. Pt stated that she didn't want to do that. Pt told that she could use a bedpan. Pt stated she couldn't use that. Pt then again informed that staff could help her to restroom but again she denied wanting to do that. This RN told pt that once she decides what she wants to do to let this RN know.

## 2016-11-21 NOTE — ED Notes (Signed)
Attempted to call [facility], no one picked up after 2 attempts.

## 2016-11-21 NOTE — ED Notes (Signed)
Pt brief saturated. Pt cleaned and dried and new brief applied. Pt sweatshirt and sweat pants placed back on.

## 2016-11-21 NOTE — ED Notes (Signed)
Pt provided apple sauce

## 2016-11-21 NOTE — ED Provider Notes (Signed)
[facility] Emergency Department Provider Note   ____________________________________________   I have reviewed the triage vital signs and the nursing notes.   HISTORY  Chief Complaint Fall   History limited by: Not Limited   HPI Michele Schultz is a 82 y.o. female who presents to the emergency department today brought in by EMS after a fall. The patient states she was getting out of bed when she slipped on the ground. She states she fell onto her right side. She did hit the right side of her head although denies any loss of consciousness. She did develop right shoulder pain and right hip pain after the fall. She denies any numbness in her extremities. Denies any concurrent chest pain or shortness of breath. She denies any recent illnesses or fevers.   Past Medical History:  Diagnosis Date  . Anemia   . Aortic stenosis, severe    a. s/p Magna Ease pericardial tissue valve size 21 mm replacement in 10/2011 for severe AS 11/12; b. echo 07/2015: EF 55-60% mod concentric LVH, GR1DD, LA mildly dilated, PASP 45 mm Hg  . Atrial tachycardia, paroxysmal (HCC)    a. with rate related LBBB.  . Cardiac pacemaker -st Judes    11/12  . Chronic diastolic heart failure ([place])    a. echo 2014: EF 55-60%, no RWMA, GR1DD, PASP 47 mm Hg; b. echo 07/2015: EF 55-60% mod concentric LVH, GR1DD, LA mildly dilated, PASP 45 mm Hg  . Complete heart block [facility])    a. s/p St Jude PPM 10/2011 (Ser # [id number]).  . Coronary artery disease    a. s/p 2v CABG 11/12 (VG-LAD, VG-OM1); b. Lexiscan 08/2015: low risk, no ischemia, EF 55-65%.  . Enthesopathy of hip region   . Esophageal reflux    followed by Dr.Seigal. stabilized with a combination of Nexium and Zantac  . Fibromyalgia   . HLD (hyperlipidemia)   . Hypertensive heart disease   . Knee joint replacement by other means   . Morbid obesity ([place])   . Neuralgia, neuritis, and radiculitis, unspecified   . Neuropathy ([place])   . Onychia  and paronychia of toe   . Osteoarthrosis, unspecified whether generalized or localized, pelvic region and thigh    mainly in her back and knees  . PAF (paroxysmal atrial fibrillation) ([place])    a. brief episodes of AF previously noted on device interrogations.  Marland Kitchen RAD (reactive airway disease)    a. chronic SOB  . Spinal stenosis   . Stress incontinence, female    followed by Dr.Cope  . Type II or unspecified type diabetes mellitus without mention of complication, not stated as uncontrolled    a. pt. reports that she is borderline   . Wide-complex tachycardia ([place])    a. Noted 4/10 - brief episode in ED. Noted again 4/21 in clinic->felt most likely to be atrial tach.    Patient Active Problem List   Diagnosis Date Noted  . Adjustment disorder with depressed mood 11/02/2016  . Nausea & vomiting 11/01/2016  . Nausea and vomiting   . Atrial fibrillation with RVR ([place]) 10/11/2016  . Hypokalemia 10/11/2016  . DNR (do not resuscitate) 09/19/2016  . Palliative care by specialist 09/19/2016  . Muscle weakness (generalized)   . Atrial tachycardia ([place])   . Diarrhea 09/17/2016  . Dehydration 09/17/2016  . Metabolic acidosis [id number]  . Headache due to trauma 07/03/2016  . Hypertensive heart disease   . Wide-complex tachycardia ([place])   . Pain  in the chest   . Emesis   . Weakness of both legs   . Coronary artery disease involving coronary bypass graft of native heart with unspecified angina pectoris   . S/P AVR (aortic valve replacement)   . Falls frequently   . HLD (hyperlipidemia)   . Fibromyalgia   . RAD (reactive airway disease)   . Morbid obesity ([place])   . Aortic stenosis, severe   . Chronic pain 06/17/2014  . Pain in the muscles 06/19/2013  . Chest pain 06/19/2013  . Wheezing 06/19/2013  . Weakness generalized 06/03/2013  . Dyspnea 01/27/2013  . Nausea 03/05/2012  . Constipation 03/05/2012  . Atrial fibrillation-non sustained 02/09/2012  . Cardiac pacemaker -st Judes    . Chronic diastolic heart failure ([place])   . Coronary artery disease   . Long term (current) use of anticoagulants 01/03/2012  . S/P CABG x 2 11/14/2011  . S/P aortic valve replacement with porcine valve 11/14/2011  . Back pain 11/14/2011  . Complete heart block ([place]) 10/20/2011  . HTN (hypertension) 09/15/2011  . Spinal stenosis 03/22/2011  . Fatigue 03/22/2011  . Hyperlipidemia 03/22/2011    Past Surgical History:  Procedure Laterality Date  . AORTIC VALVE REPLACEMENT  10/17/2011   Procedure: AORTIC VALVE REPLACEMENT (AVR);  Surgeon: Melrose Nakayama, MD;  Location: [place];  Service: Open Heart Surgery;  Laterality: N/A;  . CARDIAC CATHETERIZATION  08/2009   50% stenosis distal left main, 50% stenosis ostial left circumflex.   Marland Kitchen CARDIAC CATHETERIZATION     at [facility]  . CORONARY ARTERY BYPASS GRAFT  10/17/2011   Procedure: CORONARY ARTERY BYPASS GRAFTING (CABG);  Surgeon: Melrose Nakayama, MD;  Location: [place];  Service: Open Heart Surgery;  Laterality: N/A;  Times Two, using right leg greater saphenous vein harvested endoscopically  . ESOPHAGOGASTRODUODENOSCOPY (EGD) WITH PROPOFOL N/A 10/12/2016   Procedure: ESOPHAGOGASTRODUODENOSCOPY (EGD) WITH PROPOFOL;  Surgeon: Lucilla Lame, MD;  Location: [facility] ENDOSCOPY;  Service: Endoscopy;  Laterality: N/A;  . EYE SURGERY     IOL/ after cataracts removed- bilateral   . NASAL SINUS SURGERY  2009  . PERMANENT PACEMAKER INSERTION N/A 10/20/2011   Procedure: PERMANENT PACEMAKER INSERTION;  Surgeon: Thompson Grayer, MD;  Location: [facility] CATH LAB;  Service: Cardiovascular;  Laterality: N/A;  . REPLACEMENT TOTAL KNEE  11/2006   right knee  . TOTAL HIP ARTHROPLASTY  09/2010    Prior to Admission medications   Medication Sig Start Date End Date Taking? Authorizing Provider  acetaminophen (TYLENOL) 325 MG tablet Take 650 mg by mouth 3 (three) times daily as needed for mild pain.     Historical Provider, MD  acidophilus (RISAQUAD) CAPS capsule Take 1  capsule by mouth 2 (two) times daily. 06/26/16   Bettey Costa, MD  aspirin EC 81 MG tablet Take 81 mg by mouth every evening.     Historical Provider, MD  atorvastatin (LIPITOR) 80 MG tablet Take 80 mg by mouth daily.    Historical Provider, MD  Calcium Carbonate-Vitamin D 600-400 MG-UNIT tablet Take 1 tablet by mouth daily.    Historical Provider, MD  Cholecalciferol 1000 units capsule Take 1,000 Units by mouth at bedtime.     Historical Provider, MD  diltiazem (CARDIZEM CD) 180 MG 24 hr capsule Take 1 capsule (180 mg total) by mouth daily. 11/06/16   Gladstone Lighter, MD  furosemide (LASIX) 20 MG tablet Take 1 tablet (20 mg total) by mouth every other day. 04/06/16   Rogelia Mire, NP  HYDROcodone-acetaminophen (NORCO) 10-325 MG tablet Take 1 tablet by mouth every 4 (four) hours as needed for moderate pain. Reported on 05/30/2016 11/03/16   Epifanio Lesches, MD  magnesium hydroxide (MILK OF MAGNESIA) 400 MG/5ML suspension Take 10 mLs by mouth daily.     Historical Provider, MD  magnesium hydroxide (MILK OF MAGNESIA) 400 MG/5ML suspension Take 10 mLs by mouth daily as needed for mild constipation.    Historical Provider, MD  metoCLOPramide (REGLAN) 5 MG tablet Take 1 tablet (5 mg total) by mouth every 6 (six) hours as needed for nausea or vomiting. 09/21/16   Vaughan Basta, MD  metoCLOPramide (REGLAN) 5 MG tablet Take 5 mg by mouth 3 (three) times daily before meals. 11/10/16 11/24/16  Historical Provider, MD  mirtazapine (REMERON) 7.5 MG tablet Take 1 tablet (7.5 mg total) by mouth at bedtime. 11/16/16   Dinah C Ngetich, NP  nitroGLYCERIN (NITROSTAT) 0.4 MG SL tablet Place 1 tablet (0.4 mg total) under the tongue every 5 (five) minutes as needed for chest pain. 01/24/13   Minna Merritts, MD  ondansetron (ZOFRAN) 4 MG tablet Take 4 mg by mouth every 8 (eight) hours.    Historical Provider, MD  Potassium Chloride ER 20 MEQ TBCR Take 20 mEq by mouth daily.     Historical Provider, MD   promethazine (PHENERGAN) 12.5 MG tablet Take 1 tablet (12.5 mg total) by mouth every 6 (six) hours as needed for nausea or vomiting. 10/16/16   Nicholes Mango, MD  traZODone (DESYREL) 50 MG tablet Take 1 tablet (50 mg total) by mouth at bedtime as needed for sleep. 11/13/16   Dinah C Ngetich, NP    Allergies Citalopram; Cymbalta [duloxetine hcl]; Imipramine; Proton pump inhibitors; and Venlafaxine  Family History  Problem Relation Age of Onset  . Heart disease Sister   . Heart disease Brother   . Heart disease Brother   . Heart disease Brother   . Diabetes Other   . Anesthesia problems Neg Hx   . Hypotension Neg Hx   . Malignant hyperthermia Neg Hx   . Pseudochol deficiency Neg Hx     Social History Social History  Substance Use Topics  . Smoking status: Never Smoker  . Smokeless tobacco: Never Used  . Alcohol use No    Review of Systems  Constitutional: Negative for fever. Cardiovascular: Negative for chest pain. Respiratory: Negative for shortness of breath. Gastrointestinal: Negative for abdominal pain, vomiting and diarrhea. Genitourinary: Negative for dysuria. Musculoskeletal: Positive for right shoulder pain, right hip pain. Skin: Negative for rash. Neurological: Positive for headache.  10-point ROS otherwise negative.  ____________________________________________   PHYSICAL EXAM:  VITAL SIGNS: ED Triage Vitals  Enc Vitals Group     BP 117/80     Pulse 87     Resp 18     Temp 98.1     Temp src      SpO2 95     Weight    Constitutional: Alert and oriented. Well appearing and in no distress. Eyes: Conjunctivae are normal. Normal extraocular movements. ENT   Head: Normocephalic and atraumatic.   Nose: No congestion/rhinnorhea.   Mouth/Throat: Mucous membranes are moist.   Neck: No stridor. Hematological/Lymphatic/Immunilogical: No cervical lymphadenopathy. Cardiovascular: Normal rate, regular rhythm.  No murmurs, rubs, or gallops.   Respiratory: Normal respiratory effort without tachypnea nor retractions. Breath sounds are clear and equal bilaterally. No wheezes/rales/rhonchi. Gastrointestinal: Soft and non tender. No rebound. No guarding.  Genitourinary: Deferred Musculoskeletal: Mild tenderness to palpation of  right shoulder, no upper extremity deformity. Tenderness to palpation and rotation of right hip. Neurologic:  Normal speech and language. No gross focal neurologic deficits are appreciated.  Skin:  Skin is warm, dry and intact. No rash noted. Psychiatric: Mood and affect are normal. Speech and behavior are normal. Patient exhibits appropriate insight and judgment.  ____________________________________________    LABS (pertinent positives/negatives)  None  ____________________________________________   EKG  None  ____________________________________________    RADIOLOGY  CT head   IMPRESSION:  1. Chronic small vessel ischemic change and brain atrophy.  2. No acute intracranial abnormalities.      Right shoulder IMPRESSION:  Negative for acute bony abnormality.    Degenerative changes of the acromioclavicular joint and glenohumeral  joint.      Right hip   IMPRESSION:  No evidence of acute bony abnormality.    Surgical changes of right hip arthroplasty.    Degenerative changes of the left hip.    ____________________________________________   PROCEDURES  Procedures  ____________________________________________   INITIAL IMPRESSION / ASSESSMENT AND PLAN / ED COURSE  Pertinent labs & imaging results that were available during my care of the patient were reviewed by me and considered in my medical decision making (see chart for details).  Patient presented to the emergency department today with concerns for right sided pain after a fall. X-rays of the shoulder and hip were negative. CT head was negative. Will  discharge.  ____________________________________________   FINAL CLINICAL IMPRESSION(S) / ED DIAGNOSES  Final diagnoses:  Fall, initial encounter     Note: This dictation was prepared with Dragon dictation. Any transcriptional errors that result from this process are unintentional     Nance Pear, MD 11/21/16 1824

## 2016-11-21 NOTE — ED Notes (Signed)
Pt given turkey sandwich tray and water 

## 2016-11-21 NOTE — ED Notes (Signed)
Pt taken to CT via stretcher.  Pt refused IV in L AC after pt told this RN she could start an IV anywhere in arm.

## 2016-11-21 NOTE — ED Triage Notes (Signed)
Pt presents via [facility] from [facility] for a fall. Pt states she fell out of bed onto floor, c/o R hip, R shoulder, and head pain. Pt R leg externally rotated, able to feel all the way down leg. States pain is shooting down leg, asking for pain medication. Dr. Archie Balboa at bedside.

## 2016-11-21 NOTE — Discharge Instructions (Signed)
Please seek medical attention for any high fevers, chest pain, shortness of breath, change in behavior, persistent vomiting, bloody stool or any other new or concerning symptoms.  

## 2016-12-13 ENCOUNTER — Non-Acute Institutional Stay (SKILLED_NURSING_FACILITY): Payer: Medicare Other | Admitting: Family

## 2016-12-13 DIAGNOSIS — E782 Mixed hyperlipidemia: Secondary | ICD-10-CM

## 2016-12-13 DIAGNOSIS — I5032 Chronic diastolic (congestive) heart failure: Secondary | ICD-10-CM

## 2016-12-13 DIAGNOSIS — G4700 Insomnia, unspecified: Secondary | ICD-10-CM | POA: Diagnosis not present

## 2016-12-13 DIAGNOSIS — I110 Hypertensive heart disease with heart failure: Secondary | ICD-10-CM | POA: Diagnosis not present

## 2016-12-13 DIAGNOSIS — F411 Generalized anxiety disorder: Secondary | ICD-10-CM | POA: Diagnosis not present

## 2016-12-13 MED ORDER — LORAZEPAM 0.5 MG PO TABS: 0.5000 mg | ORAL_TABLET | Freq: Every day | ORAL | 1 refills | Status: DC

## 2016-12-13 NOTE — Progress Notes (Signed)
Location:  [facility] Room Number: 801A Place of Service:  SNF (31) Provider: Joriel Streety FNP-C   [name], MD  Patient Care Team: [name], MD as PCP - General (Internal Medicine) Minna Merritts, MD (Cardiology)  Extended Emergency Contact Information Primary Emergency Contact: Salina April, [place] [address] Johnnette Litter of [place] Phone: [phone] Relation: Nephew Secondary Emergency Contact: Knight,Shelia Address: [address]          [place], [place] [address] Johnnette Litter of [place] Phone: [phone] Mobile Phone: [phone] Relation: Niece  Code Status:  DNR  Goals of care: Advanced Directive information Advanced Directives 11/21/2016  Does Patient Have a Medical Advance Directive? Yes  Type of Advance Directive Out of facility DNR (pink MOST or yellow form)  Does patient want to make changes to medical advance directive? -  Copy of [place] in Chart? -  Would patient like information on creating a medical advance directive? -  Pre-existing out of facility DNR order (yellow form or pink MOST form) -     Chief Complaint  Patient presents with  . Medical Management of Chronic Issues    HPI:  Pt is a 83 y.o. female seen today at [facility] for medical management of chronic issues.She has a medical history of Chronic diastolic CHF, Severe AS, Afib, Neuropathy, OA, Morbid obesity among other conditions.   She is seen in her room today. She continues to complain of nausea and poor appetite.She has scheduled Reglan, Zofran and Promethazine as needed.  Facility Nurse reports patient continues to yell out at night, does not sleep and very anxious. Patient states having issues with anxiety and can't sleep at night. " Ive worked as a Marine scientist for 40 years and I cannot believe ive to live in a Nursing facility". She continues to states that she has a  lot that she worries about. No recent fall episodes reported since previous one.She was recently seen by Psychiatry service Trazodone 50 mg Tablet changed to schedule at bedtime and Remeron discontinued 12/08/2016.   Past Medical History:  Diagnosis Date  . Anemia   . Aortic stenosis, severe    a. s/p Magna Ease pericardial tissue valve size 21 mm replacement in 10/2011 for severe AS 11/12; b. echo 07/2015: EF 55-60% mod concentric LVH, GR1DD, LA mildly dilated, PASP 45 mm Hg  . Atrial tachycardia, paroxysmal (HCC)    a. with rate related LBBB.  . Cardiac pacemaker -st Judes    11/12  . Chronic diastolic heart failure ([place])    a. echo 2014: EF 55-60%, no RWMA, GR1DD, PASP 47 mm Hg; b. echo 07/2015: EF 55-60% mod concentric LVH, GR1DD, LA mildly dilated, PASP 45 mm Hg  . Complete heart block [facility])    a. s/p St Jude PPM 10/2011 (Ser # [id number]).  . Coronary artery disease    a. s/p 2v CABG 11/12 (VG-LAD, VG-OM1); b. Lexiscan 08/2015: low risk, no ischemia, EF 55-65%.  . Enthesopathy of hip region   . Esophageal reflux    followed by Dr.Seigal. stabilized with a combination of Nexium and Zantac  . Fibromyalgia   . HLD (hyperlipidemia)   . Hypertensive heart disease   . Knee joint replacement by other means   . Morbid obesity ([place])   . Neuralgia, neuritis, and radiculitis, unspecified   . Neuropathy ([place])   . Onychia and paronychia of toe   .  Osteoarthrosis, unspecified whether generalized or localized, pelvic region and thigh    mainly in her back and knees  . PAF (paroxysmal atrial fibrillation) ([place])    a. brief episodes of AF previously noted on device interrogations.  [facility] RAD (reactive airway disease)    a. chronic SOB  . Spinal stenosis   . Stress incontinence, female    followed by Dr.Cope  . Type II or unspecified type diabetes mellitus without mention of complication, not stated as uncontrolled    a. pt. reports that she is borderline   . Wide-complex tachycardia ([place])    a.  Noted 4/10 - brief episode in ED. Noted again 4/21 in clinic->felt most likely to be atrial tach.   Past Surgical History:  Procedure Laterality Date  . AORTIC VALVE REPLACEMENT  10/17/2011   Procedure: AORTIC VALVE REPLACEMENT (AVR);  Surgeon: Melrose Nakayama, MD;  Location: [facility];  Service: Open Heart Surgery;  Laterality: N/A;  . CARDIAC CATHETERIZATION  08/2009   50% stenosis distal left main, 50% stenosis ostial left circumflex.   [facility] CARDIAC CATHETERIZATION     at [facility]  . CORONARY ARTERY BYPASS GRAFT  10/17/2011   Procedure: CORONARY ARTERY BYPASS GRAFTING (CABG);  Surgeon: Melrose Nakayama, MD;  Location: [facility];  Service: Open Heart Surgery;  Laterality: N/A;  Times Two, using right leg greater saphenous vein harvested endoscopically  . ESOPHAGOGASTRODUODENOSCOPY (EGD) WITH PROPOFOL N/A 10/12/2016   Procedure: ESOPHAGOGASTRODUODENOSCOPY (EGD) WITH PROPOFOL;  Surgeon: Lucilla Lame, MD;  Location: [facility] ENDOSCOPY;  Service: Endoscopy;  Laterality: N/A;  . EYE SURGERY     IOL/ after cataracts removed- bilateral   . NASAL SINUS SURGERY  2009  . PERMANENT PACEMAKER INSERTION N/A 10/20/2011   Procedure: PERMANENT PACEMAKER INSERTION;  Surgeon: Thompson Grayer, MD;  Location: [facility] CATH LAB;  Service: Cardiovascular;  Laterality: N/A;  . REPLACEMENT TOTAL KNEE  11/2006   right knee  . TOTAL HIP ARTHROPLASTY  09/2010    Allergies  Allergen Reactions  . Citalopram Other (See Comments)    Reaction:  Altered mental status   . Cymbalta [Duloxetine Hcl] Other (See Comments)    Reaction:  Sedative for pt   . Imipramine Other (See Comments)    Reaction:  Unknown   . Proton Pump Inhibitors Other (See Comments)    Reaction:  Unknown   . Venlafaxine Nausea And Vomiting and Other (See Comments)    Reaction:  Dizziness     Allergies as of 12/13/2016      Reactions   Citalopram Other (See Comments)   Reaction:  Altered mental status    Cymbalta [duloxetine Hcl] Other (See Comments)    Reaction:  Sedative for pt    Imipramine Other (See Comments)   Reaction:  Unknown    Proton Pump Inhibitors Other (See Comments)   Reaction:  Unknown    Venlafaxine Nausea And Vomiting, Other (See Comments)   Reaction:  Dizziness       Medication List       Accurate as of 12/13/16  3:01 PM. Always use your most recent med list.          acetaminophen 325 MG tablet Commonly known as:  TYLENOL Take 650 mg by mouth 3 (three) times daily as needed for mild pain.   acidophilus Caps capsule Take 1 capsule by mouth 2 (two) times daily.   aspirin EC 81 MG tablet Take 81 mg by mouth every evening.   atorvastatin 80 MG tablet Commonly known  as:  LIPITOR Take 80 mg by mouth daily.   Calcium Carbonate-Vitamin D 600-400 MG-UNIT tablet Take 1 tablet by mouth daily.   Cholecalciferol 1000 units capsule Take 1,000 Units by mouth at bedtime.   diltiazem 180 MG 24 hr capsule Commonly known as:  CARDIZEM CD Take 1 capsule (180 mg total) by mouth daily.   furosemide 20 MG tablet Commonly known as:  LASIX Take 1 tablet (20 mg total) by mouth every other day.   HYDROcodone-acetaminophen 10-325 MG tablet Commonly known as:  NORCO Take 1 tablet by mouth every 4 (four) hours as needed for moderate pain. Reported on 05/30/2016   magnesium hydroxide 400 MG/5ML suspension Commonly known as:  MILK OF MAGNESIA Take 10 mLs by mouth daily.   metoCLOPramide 5 MG tablet Commonly known as:  REGLAN Take 1 tablet (5 mg total) by mouth every 6 (six) hours as needed for nausea or vomiting.   metoCLOPramide 5 MG tablet Commonly known as:  REGLAN Take 5 mg by mouth 3 (three) times daily before meals.   nitroGLYCERIN 0.4 MG SL tablet Commonly known as:  NITROSTAT Place 1 tablet (0.4 mg total) under the tongue every 5 (five) minutes as needed for chest pain.   ondansetron 4 MG tablet Commonly known as:  ZOFRAN Take 4 mg by mouth every 8 (eight) hours.   Potassium Chloride ER 20 MEQ  Tbcr Take 20 mEq by mouth daily.   promethazine 12.5 MG tablet Commonly known as:  PHENERGAN Take 1 tablet (12.5 mg total) by mouth every 6 (six) hours as needed for nausea or vomiting.   traZODone 50 MG tablet Commonly known as:  DESYREL Take 1 tablet (50 mg total) by mouth at bedtime as needed for sleep.       Review of Systems  Constitutional: Negative for activity change, appetite change, chills, fatigue and fever.  HENT: Negative for congestion, rhinorrhea, sinus pain, sinus pressure, sneezing and sore throat.   Eyes: Negative.   Respiratory: Negative for cough, chest tightness, shortness of breath and wheezing.   Cardiovascular: Negative for chest pain, palpitations and leg swelling.  Gastrointestinal: Negative for abdominal distention, abdominal pain, constipation, diarrhea, nausea and vomiting.       Nausea at times   Endocrine: Negative for cold intolerance, heat intolerance, polydipsia, polyphagia and polyuria.  Genitourinary: Negative for dysuria, flank pain, frequency and urgency.  Musculoskeletal: Positive for gait problem.  Skin: Negative for color change, pallor and rash.  Neurological: Negative for dizziness, seizures, light-headedness and headaches.  Hematological: Does not bruise/bleed easily.  Psychiatric/Behavioral: Negative for agitation, confusion, hallucinations and sleep disturbance. The patient is nervous/anxious.     Immunization History  Administered Date(s) Administered  . Influenza,inj,Quad PF,36+ Mos 10/13/2016  . PPD Test 03/14/2016, 06/24/2016, 11/06/2016   Pertinent  Health Maintenance Due  Topic Date Due  . FOOT EXAM  05/28/1941  . OPHTHALMOLOGY EXAM  05/28/1941  . DEXA SCAN  05/27/1998  . PNA vac Low Risk Adult (1 of 2 - PCV13) 05/27/1998  . URINE MICROALBUMIN  07/18/2012  . HEMOGLOBIN A1C  03/18/2017  . INFLUENZA VACCINE  Completed      Vitals:   12/13/16 1030  BP: 136/77  Pulse: 80  Resp: 20  Temp: 98.7 F (37.1 C)  SpO2: 98%   Weight: 244 lb (110.7 kg)  Height: 5\' 4"  (1.626 m)   Body mass index is 41.88 kg/m. Physical Exam  Constitutional: She appears well-developed and well-nourished. No distress.  HENT:  Head: Normocephalic.  Mouth/Throat: Oropharynx is clear and moist. No oropharyngeal exudate.  Eyes: Conjunctivae and EOM are normal. Pupils are equal, round, and reactive to light. Right eye exhibits no discharge. Left eye exhibits no discharge. No scleral icterus.  Neck: Normal range of motion. No JVD present. No thyromegaly present.  Cardiovascular: Normal rate, regular rhythm, normal heart sounds and intact distal pulses.  Exam reveals no gallop and no friction rub.   No murmur heard. Pulmonary/Chest: Effort normal and breath sounds normal. No respiratory distress. She has no wheezes. She has no rales.  Abdominal: Soft. Bowel sounds are normal. She exhibits no distension. There is no tenderness. There is no rebound and no guarding.  Musculoskeletal: Normal range of motion. She exhibits no edema, tenderness or deformity.  Unsteady gait   Lymphadenopathy:    She has no cervical adenopathy.  Neurological: She is alert.  Intermittent confusion   Skin: Skin is warm and dry. No rash noted. No erythema. No pallor.  Psychiatric: She has a normal mood and affect.    Labs reviewed:  Recent Labs  11/02/16 0522  11/03/16 0328 11/05/16 0540  11/06/16 0413 11/13/16 11/19/16 0531  NA 143  --  143  --   --   --  143 141  K 3.1*  < > 3.7 3.1*  < > 3.4* 3.3* 3.3*  CL 107  --  110  --   --   --   --  102  CO2 29  --  26  --   --   --   --  29  GLUCOSE 136*  --  137*  --   --   --   --  140*  BUN <5*  --  6  --   --   --  8 6  CREATININE 0.51  --  0.58  --   --   --  0.5 0.54  CALCIUM 8.2*  --  8.6*  --   --   --   --  9.5  MG  --   --  1.8 1.7  --  2.1  --   --   PHOS  --   --  2.1*  --   --   --   --   --   < > = values in this interval not displayed.  Recent Labs  10/07/16 1805 10/11/16 2145  11/01/16 0613  AST 47* 46* 34  ALT 22 21 17   ALKPHOS 87 79 86  BILITOT 0.9 0.6 0.5  PROT 8.1 7.9 7.6  ALBUMIN 3.9 3.7 3.5    Recent Labs  10/11/16 2145  11/01/16 0613 11/02/16 0522 11/13/16 11/19/16 0531  WBC 7.1  < > 4.9 5.2 4.6 4.8  NEUTROABS 4.3  --   --   --  41 2.7  HGB 12.1  < > 11.7* 10.7* 10.3* 11.4*  HCT 38.1  < > 36.1 33.2* 34* 35.6  MCV 82.8  < > 82.3 82.6  --  81.7  PLT 279  < > 271 260 297 320  < > = values in this interval not displayed. Lab Results  Component Value Date   TSH 1.644 09/20/2016   Lab Results  Component Value Date   HGBA1C 7.7 (H) 09/17/2016   Assessment/Plan 1. HTN  B/p stable. Continue on Diltiazem. Monitor BMP   2. CHF Stable.Exam findings negative.Continue on Furosemide 20 mg Tablet and diltiazem.    3. Hyperlipidemia  Continue on Lipitor. Monitor lipid panel.   4. Generalized  anxiety disorder Has had increased anxiety due to adjustment to facility.Unable to sleep at night due to anxiousness. Will start Ativan 0.5 mg Tablet at bedtime.    5. Insomnia, unspecified type Reports due to anxiety. Facility Nurse reports yelling out at night. Trazodone 50 mg Tablet changed to schedule at bedtime by Pyschiatry service. Continue to monitor. Will consider screening for dementia possible.    Family/ staff Communication: Reviewed plan of care with facility Nurse and Nurse Supervisor.   Labs/tests ordered:   urine specimen for U/A and C/S

## 2016-12-19 ENCOUNTER — Other Ambulatory Visit: Payer: Self-pay | Admitting: *Deleted

## 2016-12-19 MED ORDER — HYDROCODONE-ACETAMINOPHEN 10-325 MG PO TABS: 1.0000 | ORAL_TABLET | ORAL | 0 refills | Status: DC | PRN

## 2016-12-19 NOTE — Telephone Encounter (Signed)
[name] [phone] Fax: [phone]  

## 2016-12-29 ENCOUNTER — Encounter: Payer: Self-pay | Admitting: Family

## 2016-12-29 ENCOUNTER — Non-Acute Institutional Stay (SKILLED_NURSING_FACILITY): Payer: Medicare Other | Admitting: Family

## 2016-12-29 DIAGNOSIS — M797 Fibromyalgia: Secondary | ICD-10-CM | POA: Diagnosis not present

## 2016-12-29 NOTE — Progress Notes (Signed)
Location:  [place] Room Number: [phone] Place of Service:  SNF [phone]) Provider:  Marlowe Sax, NP  [name], MD  Patient Care Team: [name], MD as PCP - General (Internal Medicine) Minna Merritts, MD (Cardiology)  Extended Emergency Contact Information Primary Emergency Contact: Salina April, [place] [address] Johnnette Litter of [place] Phone: [phone] Relation: Nephew Secondary Emergency Contact: Knight,Shelia Address: [address]          [place], [place] [address] Johnnette Litter of [place] Phone: [phone] Mobile Phone: [phone] Relation: Niece  Code Status:  DNR Goals of care: Advanced Directive information Advanced Directives 12/29/2016  Does Patient Have a Medical Advance Directive? Yes  Type of Advance Directive Out of facility DNR (pink MOST or yellow form)  Does patient want to make changes to medical advance directive? Yes (Inpatient - patient requests chaplain consult to change a medical advance directive)  Copy of [place] in Chart? -  Would patient like information on creating a medical advance directive? -  Pre-existing out of facility DNR order (yellow form or pink MOST form) Yellow form placed in chart (order not valid for inpatient use)     Chief Complaint  Patient presents with  . Acute Visit    pain    HPI:  Pt is a 83 y.o. female seen today at [facility] for an acute visit for evaluation of generalized pain. She is has a significant medical history of HTN, CHF, CAD,Severe Aortic stenosis, fibromyalgia among other conditions. She seen in her room today. She complains of generalized pain from fibromyalgia. She has taken her pain medications without some relief. She denies any fever, chills, cough or shortness of breath. Facility nurse reports no new concerns.    Past Medical History:  Diagnosis Date  . Anemia   . Aortic  stenosis, severe    a. s/p Magna Ease pericardial tissue valve size 21 mm replacement in 10/2011 for severe AS 11/12; b. echo 07/2015: EF 55-60% mod concentric LVH, GR1DD, LA mildly dilated, PASP 45 mm Hg  . Atrial tachycardia, paroxysmal (HCC)    a. with rate related LBBB.  . Cardiac pacemaker -st Judes    11/12  . Chronic diastolic heart failure ([place])    a. echo 2014: EF 55-60%, no RWMA, GR1DD, PASP 47 mm Hg; b. echo 07/2015: EF 55-60% mod concentric LVH, GR1DD, LA mildly dilated, PASP 45 mm Hg  . Complete heart block [facility])    a. s/p St Jude PPM 10/2011 (Ser # [id number]).  . Coronary artery disease    a. s/p 2v CABG 11/12 (VG-LAD, VG-OM1); b. Lexiscan 08/2015: low risk, no ischemia, EF 55-65%.  . Enthesopathy of hip region   . Esophageal reflux    followed by Dr.Seigal. stabilized with a combination of Nexium and Zantac  . Fibromyalgia   . HLD (hyperlipidemia)   . Hypertensive heart disease   . Knee joint replacement by other means   . Morbid obesity ([place])   . Neuralgia, neuritis, and radiculitis, unspecified   . Neuropathy ([place])   . Onychia and paronychia of toe   . Osteoarthrosis, unspecified whether generalized or localized, pelvic region and thigh    mainly in her back and knees  . PAF (paroxysmal atrial fibrillation) ([place])    a. brief episodes of AF previously noted on device interrogations.  Marland Kitchen RAD (reactive airway disease)  a. chronic SOB  . Spinal stenosis   . Stress incontinence, female    followed by Dr.Cope  . Type II or unspecified type diabetes mellitus without mention of complication, not stated as uncontrolled    a. pt. reports that she is borderline   . Wide-complex tachycardia ([place])    a. Noted 4/10 - brief episode in ED. Noted again 4/21 in clinic->felt most likely to be atrial tach.   Past Surgical History:  Procedure Laterality Date  . AORTIC VALVE REPLACEMENT  10/17/2011   Procedure: AORTIC VALVE REPLACEMENT (AVR);  Surgeon: Melrose Nakayama, MD;   Location: [facility] [place];  Service: Open Heart Surgery;  Laterality: N/A;  . CARDIAC CATHETERIZATION  08/2009   50% stenosis distal left main, 50% stenosis ostial left circumflex.   Marland Kitchen CARDIAC CATHETERIZATION     at [facility]  . CORONARY ARTERY BYPASS GRAFT  10/17/2011   Procedure: CORONARY ARTERY BYPASS GRAFTING (CABG);  Surgeon: Melrose Nakayama, MD;  Location: [place];  Service: Open Heart Surgery;  Laterality: N/A;  Times Two, using right leg greater saphenous vein harvested endoscopically  . ESOPHAGOGASTRODUODENOSCOPY (EGD) WITH PROPOFOL N/A 10/12/2016   Procedure: ESOPHAGOGASTRODUODENOSCOPY (EGD) WITH PROPOFOL;  Surgeon: Lucilla Lame, MD;  Location: [facility] ENDOSCOPY;  Service: Endoscopy;  Laterality: N/A;  . EYE SURGERY     IOL/ after cataracts removed- bilateral   . NASAL SINUS SURGERY  2009  . PERMANENT PACEMAKER INSERTION N/A 10/20/2011   Procedure: PERMANENT PACEMAKER INSERTION;  Surgeon: Thompson Grayer, MD;  Location: [facility] CATH LAB;  Service: Cardiovascular;  Laterality: N/A;  . REPLACEMENT TOTAL KNEE  11/2006   right knee  . TOTAL HIP ARTHROPLASTY  09/2010    Allergies  Allergen Reactions  . Citalopram Other (See Comments)    Reaction:  Altered mental status   . Cymbalta [Duloxetine Hcl] Other (See Comments)    Reaction:  Sedative for pt   . Imipramine Other (See Comments)    Reaction:  Unknown   . Proton Pump Inhibitors Other (See Comments)    Reaction:  Unknown   . Venlafaxine Nausea And Vomiting and Other (See Comments)    Reaction:  Dizziness     Allergies as of 12/29/2016      Reactions   Citalopram Other (See Comments)   Reaction:  Altered mental status    Cymbalta [duloxetine Hcl] Other (See Comments)   Reaction:  Sedative for pt    Imipramine Other (See Comments)   Reaction:  Unknown    Proton Pump Inhibitors Other (See Comments)   Reaction:  Unknown    Venlafaxine Nausea And Vomiting, Other (See Comments)   Reaction:  Dizziness       Medication List       Accurate as  of 12/29/16  2:57 PM. Always use your most recent med list.          acetaminophen 325 MG tablet Commonly known as:  TYLENOL Take 650 mg by mouth 3 (three) times daily as needed for mild pain.   acidophilus Caps capsule Take 1 capsule by mouth 2 (two) times daily.   aspirin EC 81 MG tablet Take 81 mg by mouth every evening.   atorvastatin 80 MG tablet Commonly known as:  LIPITOR Take 80 mg by mouth daily.   Calcium Carbonate-Vitamin D 600-400 MG-UNIT tablet Take 1 tablet by mouth daily.   Cholecalciferol 1000 units capsule Take 1,000 Units by mouth at bedtime.   diltiazem 180 MG 24 hr capsule Commonly known as:  CARDIZEM CD Take 1 capsule (180 mg total) by mouth daily.   furosemide 20 MG tablet Commonly known as:  LASIX Take 1 tablet (20 mg total) by mouth every other day.   HYDROcodone-acetaminophen 10-325 MG tablet Commonly known as:  NORCO Take 1 tablet by mouth every 4 (four) hours as needed for moderate pain. Do not exceed 3gm of Tylenol in 24 hours   LORazepam 0.5 MG tablet Commonly known as:  ATIVAN Take 1 tablet (0.5 mg total) by mouth at bedtime.   magnesium hydroxide 400 MG/5ML suspension Commonly known as:  MILK OF MAGNESIA Take 10 mLs by mouth daily.   metoCLOPramide 5 MG tablet Commonly known as:  REGLAN Take 5 mg by mouth. Take one tablet every 8 hours for nausea and vomiting   metoCLOPramide 5 MG tablet Commonly known as:  REGLAN Take 5 mg by mouth 3 (three) times daily before meals.   mirtazapine 15 MG tablet Commonly known as:  REMERON Take 15 mg by mouth. Take 1/2 tablet at bedtime   nitroGLYCERIN 0.4 MG SL tablet Commonly known as:  NITROSTAT Place 1 tablet (0.4 mg total) under the tongue every 5 (five) minutes as needed for chest pain.   ondansetron 4 MG tablet Commonly known as:  ZOFRAN Take 4 mg by mouth every 8 (eight) hours.   Potassium Chloride ER 20 MEQ Tbcr Take 20 mEq by mouth daily.   promethazine 12.5 MG tablet Commonly  known as:  PHENERGAN Take 1 tablet (12.5 mg total) by mouth every 6 (six) hours as needed for nausea or vomiting.   traZODone 50 MG tablet Commonly known as:  DESYREL Take 50 mg by mouth. Take one tablet at bedtime as needed for insomnia       Review of Systems  Constitutional: Negative for activity change, appetite change, chills, fatigue and fever.       Generalized pain   HENT: Negative for congestion, rhinorrhea, sinus pain, sinus pressure, sneezing and sore throat.   Eyes: Negative.   Respiratory: Negative for cough, chest tightness, shortness of breath and wheezing.   Cardiovascular: Negative for chest pain, palpitations and leg swelling.  Gastrointestinal: Negative for abdominal distention, abdominal pain, constipation, diarrhea, nausea and vomiting.  Endocrine: Negative for cold intolerance, heat intolerance, polydipsia, polyphagia and polyuria.  Genitourinary: Negative for dysuria, flank pain, frequency and urgency.  Musculoskeletal: Positive for gait problem.  Skin: Negative for color change, pallor and rash.  Neurological: Negative for dizziness, seizures, light-headedness and headaches.  Hematological: Does not bruise/bleed easily.  Psychiatric/Behavioral: Negative for agitation, confusion, hallucinations and sleep disturbance. The patient is not nervous/anxious.     Immunization History  Administered Date(s) Administered  . Influenza,inj,Quad PF,36+ Mos 10/13/2016  . PPD Test 03/14/2016, 06/24/2016, 11/06/2016   Pertinent  Health Maintenance Due  Topic Date Due  . FOOT EXAM  05/28/1941  . OPHTHALMOLOGY EXAM  05/28/1941  . DEXA SCAN  05/27/1998  . PNA vac Low Risk Adult (1 of 2 - PCV13) 05/27/1998  . URINE MICROALBUMIN  07/18/2012  . HEMOGLOBIN A1C  03/18/2017  . INFLUENZA VACCINE  Completed   No flowsheet data found. Functional Status Survey:    Vitals:   12/29/16 1424  BP: 112/62  Pulse: 89  Resp: 16  Temp: (!) 96.6 F (35.9 C)  SpO2: 95%  Weight:  242 lb 6.4 oz (110 kg)  Height: 5\' 4"  (1.626 m)   Body mass index is 41.61 kg/m. Physical Exam  Constitutional: She appears well-developed and well-nourished. No  distress.  HENT:  Head: Normocephalic.  Mouth/Throat: Oropharynx is clear and moist. No oropharyngeal exudate.  Eyes: Conjunctivae and EOM are normal. Pupils are equal, round, and reactive to light. Right eye exhibits no discharge. Left eye exhibits no discharge. No scleral icterus.  Neck: Normal range of motion. No JVD present. No thyromegaly present.  Cardiovascular: Normal rate, regular rhythm, normal heart sounds and intact distal pulses.  Exam reveals no gallop and no friction rub.   No murmur heard. Pulmonary/Chest: Effort normal and breath sounds normal. No respiratory distress. She has no wheezes. She has no rales.  Abdominal: Soft. Bowel sounds are normal. She exhibits no distension. There is no tenderness. There is no rebound and no guarding.  Musculoskeletal: Normal range of motion. She exhibits no edema, tenderness or deformity.  Moves x 4 extremities.Unsteady gait.  Lymphadenopathy:    She has no cervical adenopathy.  Neurological: She is alert.  Confused at times   Skin: Skin is warm and dry. No rash noted. No erythema. No pallor.  Psychiatric: She has a normal mood and affect.    Labs reviewed:  Recent Labs  11/02/16 0522  11/03/16 0328 11/05/16 0540  11/06/16 0413 11/13/16 11/19/16 0531  NA 143  --  143  --   --   --  143 141  K 3.1*  < > 3.7 3.1*  < > 3.4* 3.3* 3.3*  CL 107  --  110  --   --   --   --  102  CO2 29  --  26  --   --   --   --  29  GLUCOSE 136*  --  137*  --   --   --   --  140*  BUN <5*  --  6  --   --   --  8 6  CREATININE 0.51  --  0.58  --   --   --  0.5 0.54  CALCIUM 8.2*  --  8.6*  --   --   --   --  9.5  MG  --   --  1.8 1.7  --  2.1  --   --   PHOS  --   --  2.1*  --   --   --   --   --   < > = values in this interval not displayed.  Recent Labs  10/07/16 1805  10/11/16 2145 11/01/16 0613  AST 47* 46* 34  ALT 22 21 17   ALKPHOS 87 79 86  BILITOT 0.9 0.6 0.5  PROT 8.1 7.9 7.6  ALBUMIN 3.9 3.7 3.5    Recent Labs  10/11/16 2145  11/01/16 0613 11/02/16 0522 11/13/16 11/19/16 0531  WBC 7.1  < > 4.9 5.2 4.6 4.8  NEUTROABS 4.3  --   --   --  41 2.7  HGB 12.1  < > 11.7* 10.7* 10.3* 11.4*  HCT 38.1  < > 36.1 33.2* 34* 35.6  MCV 82.8  < > 82.3 82.6  --  81.7  PLT 279  < > 271 260 297 320  < > = values in this interval not displayed. Lab Results  Component Value Date   TSH 1.644 09/20/2016   Lab Results  Component Value Date   HGBA1C 7.7 (H) 09/17/2016   Assessment/Plan Fibromyalgia  Afebrile. Continue with current pain regimen. Consult PMR for evaluation. Continue to monitor.   Family/ staff Communication: Reviewed plan of care with patient and facility Nurse supervisor.  Labs/tests ordered: None

## 2017-01-10 ENCOUNTER — Non-Acute Institutional Stay (SKILLED_NURSING_FACILITY): Payer: Medicare Other | Admitting: Family

## 2017-01-10 DIAGNOSIS — K625 Hemorrhage of anus and rectum: Secondary | ICD-10-CM | POA: Diagnosis not present

## 2017-01-10 DIAGNOSIS — K5901 Slow transit constipation: Secondary | ICD-10-CM

## 2017-01-10 NOTE — Progress Notes (Signed)
Location:  [place] Room Number: [place] of Service:  SNF (31) Provider:  Marlowe Sax, FNP-C   [name], MD  Patient Care Team: [name], MD as PCP - General (Internal Medicine) Minna Merritts, MD (Cardiology)  Extended Emergency Contact Information Primary Emergency Contact: Salina April, [place] [address] Johnnette Litter of [place] Phone: [phone] Relation: Nephew Secondary Emergency Contact: Knight,Shelia Address: [address]          [place], [place] [address] Johnnette Litter of [place] Phone: [phone] Mobile Phone: [phone] Relation: Niece  Code Status:  DNR Goals of care: Advanced Directive information Advanced Directives 12/29/2016  Does Patient Have a Medical Advance Directive? Yes  Type of Advance Directive Out of facility DNR (pink MOST or yellow form)  Does patient want to make changes to medical advance directive? Yes (Inpatient - patient requests chaplain consult to change a medical advance directive)  Copy of [place] in Chart? -  Would patient like information on creating a medical advance directive? -  Pre-existing out of facility DNR order (yellow form or pink MOST form) Yellow form placed in chart (order not valid for inpatient use)     Chief Complaint  Patient presents with  . Acute Visit    scant blood on rectum per Nurse     HPI:  Pt is a 83 y.o. female seen today at [facility] for an acute visit for evaluation of scant blood on rectum per nurse report.She is has a significant medical history of HTN, CHF, CAD,Severe Aortic stenosis, fibromyalgia among other conditions. She seen in her room today. She states had hard stool that might have caused some bleeding. She request for milk of magnesium daily for constipation. She is already on scheduled MOM daily. She denies any fever, chills, cough, N/V or diarrhea.   Past  Medical History:  Diagnosis Date  . Anemia   . Aortic stenosis, severe    a. s/p Magna Ease pericardial tissue valve size 21 mm replacement in 10/2011 for severe AS 11/12; b. echo 07/2015: EF 55-60% mod concentric LVH, GR1DD, LA mildly dilated, PASP 45 mm Hg  . Atrial tachycardia, paroxysmal (HCC)    a. with rate related LBBB.  . Cardiac pacemaker -st Judes    11/12  . Chronic diastolic heart failure ([place])    a. echo 2014: EF 55-60%, no RWMA, GR1DD, PASP 47 mm Hg; b. echo 07/2015: EF 55-60% mod concentric LVH, GR1DD, LA mildly dilated, PASP 45 mm Hg  . Complete heart block [facility])    a. s/p St Jude PPM 10/2011 (Ser # [id number]).  . Coronary artery disease    a. s/p 2v CABG 11/12 (VG-LAD, VG-OM1); b. Lexiscan 08/2015: low risk, no ischemia, EF 55-65%.  . Enthesopathy of hip region   . Esophageal reflux    followed by Dr.Seigal. stabilized with a combination of Nexium and Zantac  . Fibromyalgia   . HLD (hyperlipidemia)   . Hypertensive heart disease   . Knee joint replacement by other means   . Morbid obesity ([place])   . Neuralgia, neuritis, and radiculitis, unspecified   . Neuropathy ([place])   . Onychia and paronychia of toe   . Osteoarthrosis, unspecified whether generalized or localized, pelvic region and thigh    mainly in her back and knees  . PAF (paroxysmal atrial fibrillation) (HCC)    a. brief episodes  of AF previously noted on device interrogations.  Marland Kitchen RAD (reactive airway disease)    a. chronic SOB  . Spinal stenosis   . Stress incontinence, female    followed by Dr.Cope  . Type II or unspecified type diabetes mellitus without mention of complication, not stated as uncontrolled    a. pt. reports that she is borderline   . Wide-complex tachycardia ([place])    a. Noted 4/10 - brief episode in ED. Noted again 4/21 in clinic->felt most likely to be atrial tach.   Past Surgical History:  Procedure Laterality Date  . AORTIC VALVE REPLACEMENT  10/17/2011   Procedure: AORTIC VALVE  REPLACEMENT (AVR);  Surgeon: Melrose Nakayama, MD;  Location: [place];  Service: Open Heart Surgery;  Laterality: N/A;  . CARDIAC CATHETERIZATION  08/2009   50% stenosis distal left main, 50% stenosis ostial left circumflex.   Marland Kitchen CARDIAC CATHETERIZATION     at [facility]  . CORONARY ARTERY BYPASS GRAFT  10/17/2011   Procedure: CORONARY ARTERY BYPASS GRAFTING (CABG);  Surgeon: Melrose Nakayama, MD;  Location: [place];  Service: Open Heart Surgery;  Laterality: N/A;  Times Two, using right leg greater saphenous vein harvested endoscopically  . ESOPHAGOGASTRODUODENOSCOPY (EGD) WITH PROPOFOL N/A 10/12/2016   Procedure: ESOPHAGOGASTRODUODENOSCOPY (EGD) WITH PROPOFOL;  Surgeon: Lucilla Lame, MD;  Location: [facility] ENDOSCOPY;  Service: Endoscopy;  Laterality: N/A;  . EYE SURGERY     IOL/ after cataracts removed- bilateral   . NASAL SINUS SURGERY  2009  . PERMANENT PACEMAKER INSERTION N/A 10/20/2011   Procedure: PERMANENT PACEMAKER INSERTION;  Surgeon: Thompson Grayer, MD;  Location: [facility] CATH LAB;  Service: Cardiovascular;  Laterality: N/A;  . REPLACEMENT TOTAL KNEE  11/2006   right knee  . TOTAL HIP ARTHROPLASTY  09/2010    Allergies  Allergen Reactions  . Citalopram Other (See Comments)    Reaction:  Altered mental status   . Cymbalta [Duloxetine Hcl] Other (See Comments)    Reaction:  Sedative for pt   . Imipramine Other (See Comments)    Reaction:  Unknown   . Proton Pump Inhibitors Other (See Comments)    Reaction:  Unknown   . Venlafaxine Nausea And Vomiting and Other (See Comments)    Reaction:  Dizziness     Allergies as of 01/10/2017      Reactions   Citalopram Other (See Comments)   Reaction:  Altered mental status    Cymbalta [duloxetine Hcl] Other (See Comments)   Reaction:  Sedative for pt    Imipramine Other (See Comments)   Reaction:  Unknown    Proton Pump Inhibitors Other (See Comments)   Reaction:  Unknown    Venlafaxine Nausea And Vomiting, Other (See Comments)   Reaction:   Dizziness       Medication List       Accurate as of 01/10/17  6:14 PM. Always use your most recent med list.          acetaminophen 325 MG tablet Commonly known as:  TYLENOL Take 650 mg by mouth 3 (three) times daily as needed for mild pain.   acidophilus Caps capsule Take 1 capsule by mouth 2 (two) times daily.   aspirin EC 81 MG tablet Take 81 mg by mouth every evening.   atorvastatin 80 MG tablet Commonly known as:  LIPITOR Take 80 mg by mouth daily.   Calcium Carbonate-Vitamin D 600-400 MG-UNIT tablet Take 1 tablet by mouth daily.   Cholecalciferol 1000 units capsule Take 1,000 Units  by mouth at bedtime.   diltiazem 180 MG 24 hr capsule Commonly known as:  CARDIZEM CD Take 1 capsule (180 mg total) by mouth daily.   furosemide 20 MG tablet Commonly known as:  LASIX Take 1 tablet (20 mg total) by mouth every other day.   HYDROcodone-acetaminophen 10-325 MG tablet Commonly known as:  NORCO Take 1 tablet by mouth every 4 (four) hours as needed for moderate pain. Do not exceed 3gm of Tylenol in 24 hours   LORazepam 0.5 MG tablet Commonly known as:  ATIVAN Take 1 tablet (0.5 mg total) by mouth at bedtime.   magnesium hydroxide 400 MG/5ML suspension Commonly known as:  MILK OF MAGNESIA Take 10 mLs by mouth daily.   metoCLOPramide 5 MG tablet Commonly known as:  REGLAN Take 5 mg by mouth. Take one tablet every 8 hours for nausea and vomiting   metoCLOPramide 5 MG tablet Commonly known as:  REGLAN Take 5 mg by mouth 3 (three) times daily before meals.   mirtazapine 15 MG tablet Commonly known as:  REMERON Take 15 mg by mouth. Take 1/2 tablet at bedtime   nitroGLYCERIN 0.4 MG SL tablet Commonly known as:  NITROSTAT Place 1 tablet (0.4 mg total) under the tongue every 5 (five) minutes as needed for chest pain.   ondansetron 4 MG tablet Commonly known as:  ZOFRAN Take 4 mg by mouth every 8 (eight) hours.   Potassium Chloride ER 20 MEQ Tbcr Take 20 mEq by  mouth daily.   promethazine 12.5 MG tablet Commonly known as:  PHENERGAN Take 1 tablet (12.5 mg total) by mouth every 6 (six) hours as needed for nausea or vomiting.   traZODone 50 MG tablet Commonly known as:  DESYREL Take 50 mg by mouth. Take one tablet at bedtime as needed for insomnia       Review of Systems  Constitutional: Negative for activity change, appetite change, chills, fatigue and fever.  HENT: Negative for congestion, rhinorrhea, sinus pain, sinus pressure, sneezing and sore throat.   Eyes: Negative.   Respiratory: Negative for cough, chest tightness, shortness of breath and wheezing.   Cardiovascular: Negative for chest pain, palpitations and leg swelling.  Gastrointestinal: Positive for constipation. Negative for abdominal distention, abdominal pain, diarrhea, nausea and vomiting.  Genitourinary: Negative for dysuria, flank pain, frequency and urgency.  Musculoskeletal: Positive for gait problem.  Skin: Negative for color change, pallor and rash.  Neurological: Negative for dizziness, seizures, light-headedness and headaches.  Hematological: Does not bruise/bleed easily.  Psychiatric/Behavioral: Negative for agitation, confusion, hallucinations and sleep disturbance. The patient is not nervous/anxious.     Immunization History  Administered Date(s) Administered  . Influenza,inj,Quad PF,36+ Mos 10/13/2016  . PPD Test 03/14/2016, 06/24/2016, 11/06/2016   Pertinent  Health Maintenance Due  Topic Date Due  . FOOT EXAM  05/28/1941  . OPHTHALMOLOGY EXAM  05/28/1941  . DEXA SCAN  05/27/1998  . PNA vac Low Risk Adult (1 of 2 - PCV13) 05/27/1998  . URINE MICROALBUMIN  07/18/2012  . HEMOGLOBIN A1C  03/18/2017  . INFLUENZA VACCINE  Completed    Vitals:   01/10/17 1600  BP: 114/78  Pulse: 89  Resp: 20  Temp: 98.6 F (37 C)  SpO2: 98%  Weight: 245 lb 6.4 oz (111.3 kg)  Height: 5\' 4"  (1.626 m)   Body mass index is 42.12 kg/m. Physical Exam  Constitutional:  She appears well-developed and well-nourished. No distress.  HENT:  Head: Normocephalic.  Mouth/Throat: Oropharynx is clear and moist.  No oropharyngeal exudate.  Eyes: Conjunctivae and EOM are normal. Pupils are equal, round, and reactive to light. Right eye exhibits no discharge. Left eye exhibits no discharge. No scleral icterus.  Neck: Normal range of motion. No JVD present. No thyromegaly present.  Cardiovascular: Normal rate, regular rhythm, normal heart sounds and intact distal pulses.  Exam reveals no gallop and no friction rub.   No murmur heard. Pulmonary/Chest: Effort normal and breath sounds normal. No respiratory distress. She has no wheezes. She has no rales.  Abdominal: Soft. Bowel sounds are normal. She exhibits no distension. There is no tenderness. There is no rebound and no guarding.  Genitourinary:  Genitourinary Comments: No active bleeding per rectum noted.   Musculoskeletal: Normal range of motion. She exhibits no edema, tenderness or deformity.  Moves x 4 extremities.Unsteady gait.  Lymphadenopathy:    She has no cervical adenopathy.  Neurological: She is alert.  Confused at times   Skin: Skin is warm and dry. No rash noted. No erythema. No pallor.  Psychiatric: She has a normal mood and affect.    Labs reviewed:  Recent Labs  11/02/16 0522  11/03/16 0328 11/05/16 0540  11/06/16 0413 11/13/16 11/19/16 0531  NA 143  --  143  --   --   --  143 141  K 3.1*  < > 3.7 3.1*  < > 3.4* 3.3* 3.3*  CL 107  --  110  --   --   --   --  102  CO2 29  --  26  --   --   --   --  29  GLUCOSE 136*  --  137*  --   --   --   --  140*  BUN <5*  --  6  --   --   --  8 6  CREATININE 0.51  --  0.58  --   --   --  0.5 0.54  CALCIUM 8.2*  --  8.6*  --   --   --   --  9.5  MG  --   --  1.8 1.7  --  2.1  --   --   PHOS  --   --  2.1*  --   --   --   --   --   < > = values in this interval not displayed.  Recent Labs  10/07/16 1805 10/11/16 2145 11/01/16 0613  AST 47* 46* 34    ALT 22 21 17   ALKPHOS 87 79 86  BILITOT 0.9 0.6 0.5  PROT 8.1 7.9 7.6  ALBUMIN 3.9 3.7 3.5    Recent Labs  10/11/16 2145  11/01/16 0613 11/02/16 0522 11/13/16 11/19/16 0531  WBC 7.1  < > 4.9 5.2 4.6 4.8  NEUTROABS 4.3  --   --   --  41 2.7  HGB 12.1  < > 11.7* 10.7* 10.3* 11.4*  HCT 38.1  < > 36.1 33.2* 34* 35.6  MCV 82.8  < > 82.3 82.6  --  81.7  PLT 279  < > 271 260 297 320  < > = values in this interval not displayed. Lab Results  Component Value Date   TSH 1.644 09/20/2016   Lab Results  Component Value Date   HGBA1C 7.7 (H) 09/17/2016   Assessment/Plan Constipation Continue on MOM daily. Will add colace 100 mg capsule daily. Encourage oral and  fluid intake. Continue to monitor. Will add Miralax current medication not effective.  Blood per rectum  Scant blood noticed on rectum per facility nurse during peri care. Suspect due to constipation. Will guaiac stool x 3. Stool softener as above.Check CBC 01/11/2017   Family/ staff Communication: Reviewed plan of care with patient and facility Nurse supervisor.   Labs/tests ordered: CBC 01/11/2017

## 2017-01-16 ENCOUNTER — Encounter: Payer: Self-pay | Admitting: Family

## 2017-01-16 ENCOUNTER — Non-Acute Institutional Stay (SKILLED_NURSING_FACILITY): Payer: Medicare Other | Admitting: Family

## 2017-01-16 DIAGNOSIS — F329 Major depressive disorder, single episode, unspecified: Secondary | ICD-10-CM

## 2017-01-16 DIAGNOSIS — F32A Depression, unspecified: Secondary | ICD-10-CM

## 2017-01-16 DIAGNOSIS — F411 Generalized anxiety disorder: Secondary | ICD-10-CM | POA: Diagnosis not present

## 2017-01-16 DIAGNOSIS — I110 Hypertensive heart disease with heart failure: Secondary | ICD-10-CM | POA: Diagnosis not present

## 2017-01-16 NOTE — Progress Notes (Signed)
Location:  [place] Room Number: [id number] Place of Service:  SNF (31) Provider: Yareni Creps FNP-C   [name], MD  Patient Care Team: [name], MD as PCP - General (Internal Medicine) Minna Merritts, MD (Cardiology)  Extended Emergency Contact Information Primary Emergency Contact: Salina April, [place] [address] Johnnette Litter of [place] Phone: [phone] Relation: Nephew Secondary Emergency Contact: Knight,Shelia Address: [address]          [place], [place] [address] Johnnette Litter of [place] Phone: [phone] Mobile Phone: [phone] Relation: Niece  Code Status:  DNR  Goals of care: Advanced Directive information Advanced Directives 12/29/2016  Does Patient Have a Medical Advance Directive? Yes  Type of Advance Directive Out of facility DNR (pink MOST or yellow form)  Does patient want to make changes to medical advance directive? Yes (Inpatient - patient requests chaplain consult to change a medical advance directive)  Copy of [place] in Chart? -  Would patient like information on creating a medical advance directive? -  Pre-existing out of facility DNR order (yellow form or pink MOST form) Yellow form placed in chart (order not valid for inpatient use)     Chief Complaint  Patient presents with  . Medical Management of Chronic Issues    Routine Visit     HPI:  Pt is a 83 y.o. female seen today at [facility] for medical management of chronic issues.She has a medical history of Chronic diastolic CHF, Severe AS, Afib, Neuropathy, OA, Morbid obesity among other conditions.She is seen in her room today. She was seen by Psychiatry service Zoloft initiated.No more yelling reported at bedtime. She still states has some anxiety and issues with nausea. Patient's caregiver reports patient has good appetite. No recent illness or weight changes.    Past  Medical History:  Diagnosis Date  . Anemia   . Aortic stenosis, severe    a. s/p Magna Ease pericardial tissue valve size 21 mm replacement in 10/2011 for severe AS 11/12; b. echo 07/2015: EF 55-60% mod concentric LVH, GR1DD, LA mildly dilated, PASP 45 mm Hg  . Atrial tachycardia, paroxysmal (HCC)    a. with rate related LBBB.  . Cardiac pacemaker -st Judes    11/12  . Chronic diastolic heart failure ([place])    a. echo 2014: EF 55-60%, no RWMA, GR1DD, PASP 47 mm Hg; b. echo 07/2015: EF 55-60% mod concentric LVH, GR1DD, LA mildly dilated, PASP 45 mm Hg  . Complete heart block [facility])    a. s/p St Jude PPM 10/2011 (Ser # [id number]).  . Coronary artery disease    a. s/p 2v CABG 11/12 (VG-LAD, VG-OM1); b. Lexiscan 08/2015: low risk, no ischemia, EF 55-65%.  . Enthesopathy of hip region   . Esophageal reflux    followed by Dr.Seigal. stabilized with a combination of Nexium and Zantac  . Fibromyalgia   . HLD (hyperlipidemia)   . Hypertensive heart disease   . Knee joint replacement by other means   . Morbid obesity ([place])   . Neuralgia, neuritis, and radiculitis, unspecified   . Neuropathy ([place])   . Onychia and paronychia of toe   . Osteoarthrosis, unspecified whether generalized or localized, pelvic region and thigh    mainly in her back and knees  . PAF (paroxysmal atrial fibrillation) ([place])    a. brief episodes of AF previously noted on device interrogations.  Marland Kitchen  RAD (reactive airway disease)    a. chronic SOB  . Spinal stenosis   . Stress incontinence, female    followed by Dr.Cope  . Type II or unspecified type diabetes mellitus without mention of complication, not stated as uncontrolled    a. pt. reports that she is borderline   . Wide-complex tachycardia ([place])    a. Noted 4/10 - brief episode in ED. Noted again 4/21 in clinic->felt most likely to be atrial tach.   Past Surgical History:  Procedure Laterality Date  . AORTIC VALVE REPLACEMENT  10/17/2011   Procedure: AORTIC VALVE  REPLACEMENT (AVR);  Surgeon: Melrose Nakayama, MD;  Location: [place];  Service: Open Heart Surgery;  Laterality: N/A;  . CARDIAC CATHETERIZATION  08/2009   50% stenosis distal left main, 50% stenosis ostial left circumflex.   Marland Kitchen CARDIAC CATHETERIZATION     at [facility]  . CORONARY ARTERY BYPASS GRAFT  10/17/2011   Procedure: CORONARY ARTERY BYPASS GRAFTING (CABG);  Surgeon: Melrose Nakayama, MD;  Location: [place];  Service: Open Heart Surgery;  Laterality: N/A;  Times Two, using right leg greater saphenous vein harvested endoscopically  . ESOPHAGOGASTRODUODENOSCOPY (EGD) WITH PROPOFOL N/A 10/12/2016   Procedure: ESOPHAGOGASTRODUODENOSCOPY (EGD) WITH PROPOFOL;  Surgeon: Lucilla Lame, MD;  Location: [facility] ENDOSCOPY;  Service: Endoscopy;  Laterality: N/A;  . EYE SURGERY     IOL/ after cataracts removed- bilateral   . NASAL SINUS SURGERY  2009  . PERMANENT PACEMAKER INSERTION N/A 10/20/2011   Procedure: PERMANENT PACEMAKER INSERTION;  Surgeon: Thompson Grayer, MD;  Location: [facility] CATH LAB;  Service: Cardiovascular;  Laterality: N/A;  . REPLACEMENT TOTAL KNEE  11/2006   right knee  . TOTAL HIP ARTHROPLASTY  09/2010    Allergies  Allergen Reactions  . Citalopram Other (See Comments)    Reaction:  Altered mental status   . Cymbalta [Duloxetine Hcl] Other (See Comments)    Reaction:  Sedative for pt   . Imipramine Other (See Comments)    Reaction:  Unknown   . Proton Pump Inhibitors Other (See Comments)    Reaction:  Unknown   . Venlafaxine Nausea And Vomiting and Other (See Comments)    Reaction:  Dizziness     Allergies as of 01/16/2017      Reactions   Citalopram Other (See Comments)   Reaction:  Altered mental status    Cymbalta [duloxetine Hcl] Other (See Comments)   Reaction:  Sedative for pt    Imipramine Other (See Comments)   Reaction:  Unknown    Proton Pump Inhibitors Other (See Comments)   Reaction:  Unknown    Venlafaxine Nausea And Vomiting, Other (See Comments)   Reaction:   Dizziness       Medication List       Accurate as of 01/16/17  2:21 PM. Always use your most recent med list.          acetaminophen 325 MG tablet Commonly known as:  TYLENOL Take 650 mg by mouth 3 (three) times daily as needed for mild pain.   acidophilus Caps capsule Take 1 capsule by mouth 2 (two) times daily.   aspirin EC 81 MG tablet Take 81 mg by mouth every evening.   atorvastatin 80 MG tablet Commonly known as:  LIPITOR Take 80 mg by mouth daily.   Calcium Carbonate-Vitamin D 600-400 MG-UNIT tablet Take 1 tablet by mouth daily.   Cholecalciferol 1000 units capsule Take 1,000 Units by mouth at bedtime.   diltiazem 180 MG  24 hr capsule Commonly known as:  CARDIZEM CD Take 1 capsule (180 mg total) by mouth daily.   docusate sodium 100 MG capsule Commonly known as:  COLACE Take 100 mg by mouth daily.   furosemide 20 MG tablet Commonly known as:  LASIX Take 1 tablet (20 mg total) by mouth every other day.   HYDROcodone-acetaminophen 10-325 MG tablet Commonly known as:  NORCO Take 1 tablet by mouth every 4 (four) hours as needed for moderate pain. Do not exceed 3gm of Tylenol in 24 hours   LORazepam 0.5 MG tablet Commonly known as:  ATIVAN Take 0.25 mg by mouth daily as needed for anxiety. Stop date 01/26/17   LORazepam 0.5 MG tablet Commonly known as:  ATIVAN Take 0.5 mg by mouth at bedtime.   magnesium hydroxide 400 MG/5ML suspension Commonly known as:  MILK OF MAGNESIA Take 10 mLs by mouth daily.   metoCLOPramide 5 MG tablet Commonly known as:  REGLAN Take 5 mg by mouth 3 (three) times daily before meals.   nitroGLYCERIN 0.4 MG SL tablet Commonly known as:  NITROSTAT Place 1 tablet (0.4 mg total) under the tongue every 5 (five) minutes as needed for chest pain.   ondansetron 4 MG tablet Commonly known as:  ZOFRAN Take 4 mg by mouth every 8 (eight) hours.   Potassium Chloride ER 20 MEQ Tbcr Take 20 mEq by mouth daily.   sertraline 25 MG  tablet Commonly known as:  ZOLOFT Take 25 mg by mouth daily. Stop date 01/17/17   sertraline 25 MG tablet Commonly known as:  ZOLOFT Take 37.5 mg by mouth daily. Starting on 01/18/17   traZODone 50 MG tablet Commonly known as:  DESYREL Take 50 mg by mouth at bedtime.       Review of Systems  Constitutional: Negative for activity change, appetite change, chills, fatigue and fever.  HENT: Negative for congestion, rhinorrhea, sinus pain, sinus pressure, sneezing and sore throat.   Eyes: Negative.   Respiratory: Negative for cough, chest tightness, shortness of breath and wheezing.   Cardiovascular: Negative for chest pain, palpitations and leg swelling.  Gastrointestinal: Negative for abdominal distention, abdominal pain, constipation, diarrhea, nausea and vomiting.       Nausea at times   Endocrine: Negative for cold intolerance, heat intolerance, polydipsia, polyphagia and polyuria.  Genitourinary: Negative for dysuria, flank pain, frequency and urgency.  Musculoskeletal: Positive for gait problem.  Skin: Negative for color change, pallor and rash.  Neurological: Negative for dizziness, seizures, light-headedness and headaches.  Hematological: Does not bruise/bleed easily.  Psychiatric/Behavioral: Negative for agitation, confusion, hallucinations and sleep disturbance. The patient is nervous/anxious.     Immunization History  Administered Date(s) Administered  . Influenza,inj,Quad PF,36+ Mos 10/13/2016  . PPD Test 03/14/2016, 06/24/2016, 11/06/2016   Pertinent  Health Maintenance Due  Topic Date Due  . FOOT EXAM  05/28/1941  . OPHTHALMOLOGY EXAM  05/28/1941  . DEXA SCAN  05/27/1998  . PNA vac Low Risk Adult (1 of 2 - PCV13) 05/27/1998  . URINE MICROALBUMIN  07/18/2012  . HEMOGLOBIN A1C  03/18/2017  . INFLUENZA VACCINE  Completed    Vitals:   01/16/17 1410  BP: 129/73  Pulse: 80  Resp: 18  Temp: 97.4 F (36.3 C)  TempSrc: Oral  SpO2: 96%  Weight: 245 lb 6.4 oz  (111.3 kg)  Height: 5\' 4"  (1.626 m)   Body mass index is 42.12 kg/m. Physical Exam  Constitutional: She appears well-developed and well-nourished. No distress.  HENT:  Head: Normocephalic.  Mouth/Throat: Oropharynx is clear and moist. No oropharyngeal exudate.  Eyes: Conjunctivae and EOM are normal. Pupils are equal, round, and reactive to light. Right eye exhibits no discharge. Left eye exhibits no discharge. No scleral icterus.  Neck: Normal range of motion. No JVD present. No thyromegaly present.  Cardiovascular: Normal rate, regular rhythm, normal heart sounds and intact distal pulses.  Exam reveals no gallop and no friction rub.   No murmur heard. Pulmonary/Chest: Effort normal and breath sounds normal. No respiratory distress. She has no wheezes. She has no rales.  Abdominal: Soft. Bowel sounds are normal. She exhibits no distension. There is no tenderness. There is no rebound and no guarding.  Musculoskeletal: Normal range of motion. She exhibits no edema, tenderness or deformity.  Unsteady gait   Lymphadenopathy:    She has no cervical adenopathy.  Neurological: She is alert.  Intermittent confusion   Skin: Skin is warm and dry. No rash noted. No erythema. No pallor.  Psychiatric: She has a normal mood and affect.    Labs reviewed:  Recent Labs  11/02/16 0522  11/03/16 0328 11/05/16 0540  11/06/16 0413  11/16/16 11/19/16 0531 11/20/16  NA 143  --  143  --   --   --   < > 147 141 145  K 3.1*  < > 3.7 3.1*  < > 3.4*  < > 3.2* 3.3* 3.5  CL 107  --  110  --   --   --   --   --  102  --   CO2 29  --  26  --   --   --   --   --  29  --   GLUCOSE 136*  --  137*  --   --   --   --   --  140*  --   BUN <5*  --  6  --   --   --   < > 6 6 8   CREATININE 0.51  --  0.58  --   --   --   < > 0.5 0.54 0.5  CALCIUM 8.2*  --  8.6*  --   --   --   --   --  9.5  --   MG  --   --  1.8 1.7  --  2.1  --   --   --   --   PHOS  --   --  2.1*  --   --   --   --   --   --   --   < > = values  in this interval not displayed.  Recent Labs  10/07/16 1805 10/11/16 2145 11/01/16 0613  AST 47* 46* 34  ALT 22 21 17   ALKPHOS 87 79 86  BILITOT 0.9 0.6 0.5  PROT 8.1 7.9 7.6  ALBUMIN 3.9 3.7 3.5    Recent Labs  10/11/16 2145  11/01/16 0613 11/02/16 0522 11/13/16 11/19/16 0531  WBC 7.1  < > 4.9 5.2 4.6 4.8  NEUTROABS 4.3  --   --   --  41 2.7  HGB 12.1  < > 11.7* 10.7* 10.3* 11.4*  HCT 38.1  < > 36.1 33.2* 34* 35.6  MCV 82.8  < > 82.3 82.6  --  81.7  PLT 279  < > 271 260 297 320  < > = values in this interval not displayed. Lab Results  Component Value Date   TSH 1.644 09/20/2016  Lab Results  Component Value Date   HGBA1C 7.7 (H) 09/17/2016   Assessment/Plan  1. HTN  B/p stable. Continue on Diltiazem. Monitor BMP   2.  Depression  Stable. Zoloft initiated by psychiatry service. Continue to monitor for mood changes.   3. Generalized anxiety disorder Has improved.recently seen by Psychiatry services. Continue on Zoloft.    Family/ staff Communication: Reviewed plan of care with patient and Nurse Supervisor.   Labs/tests ordered:  None

## 2017-02-07 LAB — BASIC METABOLIC PANEL
BUN: 9 mg/dL (ref 4–21)
CREATININE: 0.6 mg/dL (ref 0.5–1.1)
GLUCOSE: 135 mg/dL
Potassium: 3.6 mmol/L (ref 3.4–5.3)
Sodium: 145 mmol/L (ref 137–147)

## 2017-02-07 LAB — HEPATIC FUNCTION PANEL
ALT: 11 U/L (ref 7–35)
AST: 21 U/L (ref 13–35)
Alkaline Phosphatase: 81 U/L (ref 25–125)
BILIRUBIN, TOTAL: 0.7 mg/dL

## 2017-02-09 ENCOUNTER — Encounter: Payer: Self-pay | Admitting: Family

## 2017-02-09 ENCOUNTER — Non-Acute Institutional Stay (SKILLED_NURSING_FACILITY): Payer: Medicare Other | Admitting: Family

## 2017-02-09 DIAGNOSIS — E782 Mixed hyperlipidemia: Secondary | ICD-10-CM | POA: Diagnosis not present

## 2017-02-09 DIAGNOSIS — F418 Other specified anxiety disorders: Secondary | ICD-10-CM

## 2017-02-09 NOTE — Progress Notes (Signed)
Location:  [place] Room Number: 801A Place of Service:  SNF (31) Provider:  Marlowe Sax, NP  [name], MD  Patient Care Team: [name], MD as PCP - General (Internal Medicine) Minna Merritts, MD (Cardiology)  Extended Emergency Contact Information Primary Emergency Contact: Salina April, [place] [address] Johnnette Litter of [place] Phone: [phone] Relation: Nephew Secondary Emergency Contact: Knight,Shelia Address: [address]          [place], [place] [address] Johnnette Litter of [place] Phone: [phone] Mobile Phone: [phone] Relation: Niece  Code Status:  DNR Goals of care: Advanced Directive information Advanced Directives 02/09/2017  Does Patient Have a Medical Advance Directive? Yes  Type of Advance Directive Out of facility DNR (pink MOST or yellow form)  Does patient want to make changes to medical advance directive? -  Copy of [place] in Chart? -  Would patient like information on creating a medical advance directive? -  Pre-existing out of facility DNR order (yellow form or pink MOST form) Yellow form placed in chart (order not valid for inpatient use)     Chief Complaint  Patient presents with  . Medical Management of Chronic Issues    Routine visit    HPI:  Pt is a 83 y.o. female seen today at [facility] for medical management of chronic diseases. She has a medical HTN, Afib, CHF, CAD,fibromylagia, Hyperlipidemia, Morbid obesity, depression, anxiety among toher conditions. She is seen in her room today. She continues to complain of feeling nauseous and vomiting. Facility staff reports no emesis noted.She has had a 10 pound weight loss over one month per facility weight log.Follows up with facility Registered dietician. Also awaiting GI appointment for evaluation of chronic nausea. No recent acute illnesses or hospital admission.     Past Medical History:  Diagnosis Date  . Anemia   . Aortic stenosis, severe    a. s/p Magna Ease pericardial tissue valve size 21 mm replacement in 10/2011 for severe AS 11/12; b. echo 07/2015: EF 55-60% mod concentric LVH, GR1DD, LA mildly dilated, PASP 45 mm Hg  . Atrial tachycardia, paroxysmal (HCC)    a. with rate related LBBB.  . Cardiac pacemaker -st Judes    11/12  . Chronic diastolic heart failure ([place])    a. echo 2014: EF 55-60%, no RWMA, GR1DD, PASP 47 mm Hg; b. echo 07/2015: EF 55-60% mod concentric LVH, GR1DD, LA mildly dilated, PASP 45 mm Hg  . Complete heart block [facility])    a. s/p St Jude PPM 10/2011 (Ser # [id number]).  . Coronary artery disease    a. s/p 2v CABG 11/12 (VG-LAD, VG-OM1); b. Lexiscan 08/2015: low risk, no ischemia, EF 55-65%.  . Enthesopathy of hip region   . Esophageal reflux    followed by Dr.Seigal. stabilized with a combination of Nexium and Zantac  . Fibromyalgia   . HLD (hyperlipidemia)   . Hypertensive heart disease   . Knee joint replacement by other means   . Morbid obesity ([place])   . Neuralgia, neuritis, and radiculitis, unspecified   . Neuropathy ([place])   . Onychia and paronychia of toe   . Osteoarthrosis, unspecified whether generalized or localized, pelvic region and thigh    mainly in her back and knees  . PAF (paroxysmal atrial fibrillation) ([place])    a. brief episodes of AF previously noted on device interrogations.  Marland Kitchen  RAD (reactive airway disease)    a. chronic SOB  . Spinal stenosis   . Stress incontinence, female    followed by Dr.Cope  . Type II or unspecified type diabetes mellitus without mention of complication, not stated as uncontrolled    a. pt. reports that she is borderline   . Wide-complex tachycardia ([place])    a. Noted 4/10 - brief episode in ED. Noted again 4/21 in clinic->felt most likely to be atrial tach.   Past Surgical History:  Procedure Laterality Date  . AORTIC VALVE REPLACEMENT  10/17/2011   Procedure: AORTIC  VALVE REPLACEMENT (AVR);  Surgeon: Melrose Nakayama, MD;  Location: [place];  Service: Open Heart Surgery;  Laterality: N/A;  . CARDIAC CATHETERIZATION  08/2009   50% stenosis distal left main, 50% stenosis ostial left circumflex.   Marland Kitchen CARDIAC CATHETERIZATION     at [facility]  . CORONARY ARTERY BYPASS GRAFT  10/17/2011   Procedure: CORONARY ARTERY BYPASS GRAFTING (CABG);  Surgeon: Melrose Nakayama, MD;  Location: [place];  Service: Open Heart Surgery;  Laterality: N/A;  Times Two, using right leg greater saphenous vein harvested endoscopically  . ESOPHAGOGASTRODUODENOSCOPY (EGD) WITH PROPOFOL N/A 10/12/2016   Procedure: ESOPHAGOGASTRODUODENOSCOPY (EGD) WITH PROPOFOL;  Surgeon: Lucilla Lame, MD;  Location: [facility] ENDOSCOPY;  Service: Endoscopy;  Laterality: N/A;  . EYE SURGERY     IOL/ after cataracts removed- bilateral   . NASAL SINUS SURGERY  2009  . PERMANENT PACEMAKER INSERTION N/A 10/20/2011   Procedure: PERMANENT PACEMAKER INSERTION;  Surgeon: Thompson Grayer, MD;  Location: [facility] CATH LAB;  Service: Cardiovascular;  Laterality: N/A;  . REPLACEMENT TOTAL KNEE  11/2006   right knee  . TOTAL HIP ARTHROPLASTY  09/2010    Allergies  Allergen Reactions  . Citalopram Other (See Comments)    Reaction:  Altered mental status   . Cymbalta [Duloxetine Hcl] Other (See Comments)    Reaction:  Sedative for pt   . Imipramine Other (See Comments)    Reaction:  Unknown   . Proton Pump Inhibitors Other (See Comments)    Reaction:  Unknown   . Venlafaxine Nausea And Vomiting and Other (See Comments)    Reaction:  Dizziness     Allergies as of 02/09/2017      Reactions   Citalopram Other (See Comments)   Reaction:  Altered mental status    Cymbalta [duloxetine Hcl] Other (See Comments)   Reaction:  Sedative for pt    Imipramine Other (See Comments)   Reaction:  Unknown    Proton Pump Inhibitors Other (See Comments)   Reaction:  Unknown    Venlafaxine Nausea And Vomiting, Other (See Comments)    Reaction:  Dizziness       Medication List       Accurate as of 02/09/17 11:52 AM. Always use your most recent med list.          acetaminophen 325 MG tablet Commonly known as:  TYLENOL Take 650 mg by mouth 3 (three) times daily as needed for mild pain.   acidophilus Caps capsule Take 1 capsule by mouth 2 (two) times daily.   aspirin EC 81 MG tablet Take 81 mg by mouth every evening.   atorvastatin 80 MG tablet Commonly known as:  LIPITOR Take 80 mg by mouth daily.   Calcium Carbonate-Vitamin D 600-400 MG-UNIT tablet Take 1 tablet by mouth daily.   Cholecalciferol 1000 units capsule Take 1,000 Units by mouth at bedtime.   diltiazem 180 MG 24  hr capsule Commonly known as:  CARDIZEM CD Take 1 capsule (180 mg total) by mouth daily.   docusate sodium 100 MG capsule Commonly known as:  COLACE Take 100 mg by mouth daily.   furosemide 20 MG tablet Commonly known as:  LASIX Take 1 tablet (20 mg total) by mouth every other day.   HYDROcodone-acetaminophen 10-325 MG tablet Commonly known as:  NORCO Take 1 tablet by mouth every 4 (four) hours as needed for moderate pain. Do not exceed 3gm of Tylenol in 24 hours   LORazepam 0.5 MG tablet Commonly known as:  ATIVAN Take 0.5 mg by mouth at bedtime.   magnesium hydroxide 400 MG/5ML suspension Commonly known as:  MILK OF MAGNESIA Take 10 mLs by mouth daily.   metoCLOPramide 5 MG tablet Commonly known as:  REGLAN Take 5 mg by mouth 3 (three) times daily before meals.   nitroGLYCERIN 0.4 MG SL tablet Commonly known as:  NITROSTAT Place 1 tablet (0.4 mg total) under the tongue every 5 (five) minutes as needed for chest pain.   ondansetron 4 MG tablet Commonly known as:  ZOFRAN Take 4 mg by mouth every 8 (eight) hours.   Potassium Chloride ER 20 MEQ Tbcr Take 20 mEq by mouth daily.   sertraline 25 MG tablet Commonly known as:  ZOLOFT Take 25 mg by mouth. Take 3 tablets = 75 mg daily for anxiety and depression     traZODone 50 MG tablet Commonly known as:  DESYREL Take 50 mg by mouth at bedtime.       Review of Systems  Constitutional: Negative for activity change, appetite change, chills, fatigue and fever.  HENT: Negative for congestion, rhinorrhea, sinus pain, sinus pressure, sneezing and sore throat.   Eyes: Negative.   Respiratory: Negative for cough, chest tightness, shortness of breath and wheezing.   Cardiovascular: Negative for chest pain, palpitations and leg swelling.  Gastrointestinal: Negative for abdominal distention, abdominal pain, constipation, diarrhea and vomiting.       Nausea at times   Endocrine: Negative for cold intolerance, heat intolerance, polydipsia, polyphagia and polyuria.  Genitourinary: Negative for dysuria, flank pain, frequency and urgency.  Musculoskeletal: Positive for gait problem.  Skin: Negative for color change, pallor and rash.  Neurological: Negative for dizziness, seizures, light-headedness and headaches.  Hematological: Does not bruise/bleed easily.  Psychiatric/Behavioral: Negative for agitation, confusion, hallucinations and sleep disturbance. The patient is not nervous/anxious.     Immunization History  Administered Date(s) Administered  . Influenza,inj,Quad PF,36+ Mos 10/13/2016  . PPD Test 03/14/2016, 06/24/2016, 11/06/2016   Pertinent  Health Maintenance Due  Topic Date Due  . FOOT EXAM  05/28/1941  . OPHTHALMOLOGY EXAM  05/28/1941  . DEXA SCAN  05/27/1998  . PNA vac Low Risk Adult (1 of 2 - PCV13) 05/27/1998  . URINE MICROALBUMIN  07/18/2012  . HEMOGLOBIN A1C  03/18/2017  . INFLUENZA VACCINE  Completed    Vitals:   02/09/17 1140  BP: 123/61  Pulse: 72  Resp: 19  Temp: 98.7 F (37.1 C)  SpO2: 97%  Weight: 235 lb (106.6 kg)  Height: 5\' 4"  (1.626 m)   Body mass index is 40.34 kg/m. Physical Exam  Constitutional: She appears well-developed and well-nourished. No distress.  HENT:  Head: Normocephalic.  Mouth/Throat:  Oropharynx is clear and moist. No oropharyngeal exudate.  Eyes: Conjunctivae and EOM are normal. Pupils are equal, round, and reactive to light. Right eye exhibits no discharge. Left eye exhibits no discharge. No scleral icterus.  Neck:  Normal range of motion. No JVD present. No thyromegaly present.  Cardiovascular: Normal rate, regular rhythm, normal heart sounds and intact distal pulses.  Exam reveals no gallop and no friction rub.   No murmur heard. Pulmonary/Chest: Effort normal and breath sounds normal. No respiratory distress. She has no wheezes. She has no rales.  Abdominal: Soft. Bowel sounds are normal. She exhibits no distension. There is no tenderness. There is no rebound and no guarding.  Musculoskeletal: Normal range of motion. She exhibits no edema, tenderness or deformity.  Unsteady gait   Lymphadenopathy:    She has no cervical adenopathy.  Neurological: She is alert.  Intermittent confusion   Skin: Skin is warm and dry. No rash noted. No erythema. No pallor.  Psychiatric: She has a normal mood and affect.    Labs reviewed:  Recent Labs  11/02/16 0522  11/03/16 0328 11/05/16 0540  11/06/16 0413  11/19/16 0531 11/20/16 02/07/17  NA 143  --  143  --   --   --   < > 141 145 145  K 3.1*  < > 3.7 3.1*  < > 3.4*  < > 3.3* 3.5 3.6  CL 107  --  110  --   --   --   --  102  --   --   CO2 29  --  26  --   --   --   --  29  --   --   GLUCOSE 136*  --  137*  --   --   --   --  140*  --   --   BUN <5*  --  6  --   --   --   < > 6 8 9   CREATININE 0.51  --  0.58  --   --   --   < > 0.54 0.5 0.6  CALCIUM 8.2*  --  8.6*  --   --   --   --  9.5  --   --   MG  --   --  1.8 1.7  --  2.1  --   --   --   --   PHOS  --   --  2.1*  --   --   --   --   --   --   --   < > = values in this interval not displayed.  Recent Labs  10/07/16 1805 10/11/16 2145 11/01/16 0613 02/07/17  AST 47* 46* 34 21  ALT 22 21 17 11   ALKPHOS 87 79 86 81  BILITOT 0.9 0.6 0.5  --   PROT 8.1 7.9 7.6  --    ALBUMIN 3.9 3.7 3.5  --     Recent Labs  10/11/16 2145  11/01/16 0613 11/02/16 0522 11/13/16 11/19/16 0531  WBC 7.1  < > 4.9 5.2 4.6 4.8  NEUTROABS 4.3  --   --   --  41 2.7  HGB 12.1  < > 11.7* 10.7* 10.3* 11.4*  HCT 38.1  < > 36.1 33.2* 34* 35.6  MCV 82.8  < > 82.3 82.6  --  81.7  PLT 279  < > 271 260 297 320  < > = values in this interval not displayed. Lab Results  Component Value Date   TSH 1.644 09/20/2016   Lab Results  Component Value Date   HGBA1C 7.7 (H) 09/17/2016   Assessment/Plan  Depression with anxiety  Continues to follow up with Psychiatry service.  Zoloft dose recently increased.Will check TSH level 02/12/2017.    Hyperlipdemia  Continue on Lipitor. Lipid panel 02/12/2017.   Morbid obesity  Continue on heart healthy diet. Obtain Hgb A1C 02/12/2017   Family/ staff Communication: Reviewed plan of care with patient and facility Nurse supervisor.   Labs/tests ordered: TSH level, Hgb A1C and Lipid panel 02/12/2017

## 2017-02-12 LAB — HEMOGLOBIN A1C: Hemoglobin A1C: 7

## 2017-02-12 LAB — TSH: TSH: 1.17 u[IU]/mL (ref 0.41–5.90)

## 2017-02-12 LAB — BASIC METABOLIC PANEL
BUN: 8 mg/dL (ref 4–21)
CREATININE: 0.7 mg/dL (ref 0.5–1.1)
GLUCOSE: 130 mg/dL
Potassium: 3.1 mmol/L — AB (ref 3.4–5.3)
Sodium: 142 mmol/L (ref 137–147)

## 2017-02-28 ENCOUNTER — Encounter: Payer: Self-pay | Admitting: Family

## 2017-02-28 ENCOUNTER — Non-Acute Institutional Stay (SKILLED_NURSING_FACILITY): Payer: Medicare Other | Admitting: Family

## 2017-02-28 DIAGNOSIS — I2510 Atherosclerotic heart disease of native coronary artery without angina pectoris: Secondary | ICD-10-CM

## 2017-02-28 DIAGNOSIS — I4891 Unspecified atrial fibrillation: Secondary | ICD-10-CM | POA: Diagnosis not present

## 2017-02-28 DIAGNOSIS — I5032 Chronic diastolic (congestive) heart failure: Secondary | ICD-10-CM

## 2017-02-28 DIAGNOSIS — R2681 Unsteadiness on feet: Secondary | ICD-10-CM | POA: Diagnosis not present

## 2017-02-28 DIAGNOSIS — R29898 Other symptoms and signs involving the musculoskeletal system: Secondary | ICD-10-CM

## 2017-02-28 DIAGNOSIS — F329 Major depressive disorder, single episode, unspecified: Secondary | ICD-10-CM | POA: Diagnosis not present

## 2017-02-28 DIAGNOSIS — R112 Nausea with vomiting, unspecified: Secondary | ICD-10-CM | POA: Diagnosis not present

## 2017-02-28 DIAGNOSIS — K5901 Slow transit constipation: Secondary | ICD-10-CM

## 2017-02-28 DIAGNOSIS — I110 Hypertensive heart disease with heart failure: Secondary | ICD-10-CM | POA: Diagnosis not present

## 2017-02-28 DIAGNOSIS — F411 Generalized anxiety disorder: Secondary | ICD-10-CM

## 2017-02-28 DIAGNOSIS — M797 Fibromyalgia: Secondary | ICD-10-CM | POA: Diagnosis not present

## 2017-02-28 DIAGNOSIS — I350 Nonrheumatic aortic (valve) stenosis: Secondary | ICD-10-CM

## 2017-02-28 DIAGNOSIS — E782 Mixed hyperlipidemia: Secondary | ICD-10-CM

## 2017-02-28 DIAGNOSIS — F32A Depression, unspecified: Secondary | ICD-10-CM

## 2017-02-28 NOTE — Progress Notes (Signed)
Location:  [facility] Room Number: 016-W Place of Service:  SNF (31)  Provider: Marlowe Sax FNP-C   PCP: [name], MD Patient Care Team: [name], MD as PCP - General (Internal Medicine) Minna Merritts, MD (Cardiology)  Extended Emergency Contact Information Primary Emergency Contact: Salina April, [place] [address] Johnnette Litter of [place] Phone: [phone] Relation: Nephew Secondary Emergency Contact: Knight,Shelia Address: [address]          [place], [place] [address] Johnnette Litter of [place] Phone: [phone] Mobile Phone: [phone] Relation: Niece  Code Status: DNR Goals of care:  Advanced Directive information Advanced Directives 02/09/2017  Does Patient Have a Medical Advance Directive? Yes  Type of Advance Directive Out of facility DNR (pink MOST or yellow form)  Does patient want to make changes to medical advance directive? -  Copy of [place] in Chart? -  Would patient like information on creating a medical advance directive? -  Pre-existing out of facility DNR order (yellow form or pink MOST form) Yellow form placed in chart (order not valid for inpatient use)     Allergies  Allergen Reactions  . Citalopram Other (See Comments)    Reaction:  Altered mental status   . Cymbalta [Duloxetine Hcl] Other (See Comments)    Reaction:  Sedative for pt   . Imipramine Other (See Comments)    Reaction:  Unknown   . Proton Pump Inhibitors Other (See Comments)    Reaction:  Unknown   . Venlafaxine Nausea And Vomiting and Other (See Comments)    Reaction:  Dizziness     Chief Complaint  Patient presents with  . Discharge Note    Discharge from [facility] to [facility]    HPI:  83 y.o. female seen today at [facility] for discharge home.she was here for short term rehabilitation for post hospital  admission11/29/17-12/4/17 with nausea and vomiting.She has had some outpatient workup with EGD which have been unrevealing. She was diagnosed with left lower lobe pneumonia and was started on antibiotic.She has a medical history of HTN, CHF, severe Aortic stenosis,Afib,Hyperlipidemia, Obesity,Fibromyalgia, frequent falls among other conditions.she is seen in her room today.   She has worked well with PT/OT stable for discharge to ALF.She continues to be high risk for fall since she does not use call bell to call for assistance but yells out and scream for assistance. She attempts to transfer self without any assistance.She will be discharged to ALF with Home health PT/OT to continue with ROM, Exercise, Gait stability and muscle strengthening.She require standard WC with Cushion, anti tippers, extended brake handles, removable elevating leg rests to enable her to maintain current level of independence with ADL's which cannot be achieved with walker or cane.Home health services will be arranged by facility social worker prior to discharge. She will discharge with her medication from the facility.Prescription medication will be written x 1 month then patient to follow up with PCP in 1-2 weeks. Facility staff report no new concerns.   Past Medical History:  Diagnosis Date  . Anemia   . Aortic stenosis, severe    a. s/p Magna Ease pericardial tissue valve size 21 mm replacement in 10/2011 for severe AS 11/12; b. echo 07/2015: EF 55-60% mod concentric LVH, GR1DD, LA mildly dilated, PASP 45 mm Hg  . Atrial tachycardia, paroxysmal (HCC)    a. with rate related LBBB.  Marland Kitchen  Cardiac pacemaker -st Judes    11/12  . Chronic diastolic heart failure ([facility])    a. echo 2014: EF 55-60%, no RWMA, GR1DD, PASP 47 mm Hg; b. echo 07/2015: EF 55-60% mod concentric LVH, GR1DD, LA mildly dilated, PASP 45 mm Hg  . Complete heart block [facility])    a. s/p St Jude PPM 10/2011 (Ser # [id number]).  . Coronary artery disease    a. s/p 2v CABG  11/12 (VG-LAD, VG-OM1); b. Lexiscan 08/2015: low risk, no ischemia, EF 55-65%.  . Enthesopathy of hip region   . Esophageal reflux    followed by Dr.Seigal. stabilized with a combination of Nexium and Zantac  . Fibromyalgia   . HLD (hyperlipidemia)   . Hypertensive heart disease   . Knee joint replacement by other means   . Morbid obesity ([place])   . Neuralgia, neuritis, and radiculitis, unspecified   . Neuropathy ([place])   . Onychia and paronychia of toe   . Osteoarthrosis, unspecified whether generalized or localized, pelvic region and thigh    mainly in her back and knees  . PAF (paroxysmal atrial fibrillation) ([facility])    a. brief episodes of AF previously noted on device interrogations.  Marland Kitchen RAD (reactive airway disease)    a. chronic SOB  . Spinal stenosis   . Stress incontinence, female    followed by Dr.Cope  . Type II or unspecified type diabetes mellitus without mention of complication, not stated as uncontrolled    a. pt. reports that she is borderline   . Wide-complex tachycardia ([place])    a. Noted 4/10 - brief episode in ED. Noted again 4/21 in clinic->felt most likely to be atrial tach.    Past Surgical History:  Procedure Laterality Date  . AORTIC VALVE REPLACEMENT  10/17/2011   Procedure: AORTIC VALVE REPLACEMENT (AVR);  Surgeon: Melrose Nakayama, MD;  Location: [facility];  Service: Open Heart Surgery;  Laterality: N/A;  . CARDIAC CATHETERIZATION  08/2009   50% stenosis distal left main, 50% stenosis ostial left circumflex.   Marland Kitchen CARDIAC CATHETERIZATION     at [facility] [place]  . CORONARY ARTERY BYPASS GRAFT  10/17/2011   Procedure: CORONARY ARTERY BYPASS GRAFTING (CABG);  Surgeon: Melrose Nakayama, MD;  Location: [facility];  Service: Open Heart Surgery;  Laterality: N/A;  Times Two, using right leg greater saphenous vein harvested endoscopically  . ESOPHAGOGASTRODUODENOSCOPY (EGD) WITH PROPOFOL N/A 10/12/2016   Procedure: ESOPHAGOGASTRODUODENOSCOPY (EGD) WITH PROPOFOL;  Surgeon: Lucilla Lame, MD;  Location: [facility] ENDOSCOPY;  Service: Endoscopy;  Laterality: N/A;  . EYE SURGERY     IOL/ after cataracts removed- bilateral   . NASAL SINUS SURGERY  2009  . PERMANENT PACEMAKER INSERTION N/A 10/20/2011   Procedure: PERMANENT PACEMAKER INSERTION;  Surgeon: Thompson Grayer, MD;  Location: [facility] CATH LAB;  Service: Cardiovascular;  Laterality: N/A;  . REPLACEMENT TOTAL KNEE  11/2006   right knee  . TOTAL HIP ARTHROPLASTY  09/2010      reports that she has never smoked. She has never used smokeless tobacco. She reports that she does not drink alcohol or use drugs. Social History   Social History  . Marital status: Widowed    Spouse name: N/A  . Number of children: N/A  . Years of education: N/A   Occupational History  . Not on file.   Social History Main Topics  . Smoking status: Never Smoker  . Smokeless tobacco: Never Used  . Alcohol use No  . Drug use: No  .  Sexual activity: No   Other Topics Concern  . Not on file   Social History Narrative  . No narrative on file    Allergies  Allergen Reactions  . Citalopram Other (See Comments)    Reaction:  Altered mental status   . Cymbalta [Duloxetine Hcl] Other (See Comments)    Reaction:  Sedative for pt   . Imipramine Other (See Comments)    Reaction:  Unknown   . Proton Pump Inhibitors Other (See Comments)    Reaction:  Unknown   . Venlafaxine Nausea And Vomiting and Other (See Comments)    Reaction:  Dizziness     Pertinent  Health Maintenance Due  Topic Date Due  . FOOT EXAM  05/28/1941  . OPHTHALMOLOGY EXAM  05/28/1941  . DEXA SCAN  05/27/1998  . PNA vac Low Risk Adult (1 of 2 - PCV13) 05/27/1998  . URINE MICROALBUMIN  07/18/2012  . HEMOGLOBIN A1C  08/15/2017  . INFLUENZA VACCINE  Completed    Medications: Allergies as of 02/28/2017      Reactions   Citalopram Other (See Comments)   Reaction:  Altered mental status    Cymbalta [duloxetine Hcl] Other (See Comments)   Reaction:  Sedative for pt     Imipramine Other (See Comments)   Reaction:  Unknown    Proton Pump Inhibitors Other (See Comments)   Reaction:  Unknown    Venlafaxine Nausea And Vomiting, Other (See Comments)   Reaction:  Dizziness       Medication List       Accurate as of 02/28/17  7:43 PM. Always use your most recent med list.          acetaminophen 325 MG tablet Commonly known as:  TYLENOL Take 650 mg by mouth 3 (three) times daily as needed for mild pain.   acidophilus Caps capsule Take 1 capsule by mouth 2 (two) times daily.   aluminum-magnesium hydroxide-simethicone 161-096-04 MG/5ML Susp Commonly known as:  MAALOX Take 30 mLs by mouth every 4 (four) hours as needed.   aspirin EC 81 MG tablet Take 81 mg by mouth every evening.   atorvastatin 80 MG tablet Commonly known as:  LIPITOR Take 80 mg by mouth daily.   Calcium Carbonate-Vitamin D 600-400 MG-UNIT tablet Take 1 tablet by mouth daily.   Cholecalciferol 1000 units capsule Take 1,000 Units by mouth at bedtime.   diltiazem 180 MG 24 hr capsule Commonly known as:  CARDIZEM CD Take 1 capsule (180 mg total) by mouth daily.   docusate sodium 100 MG capsule Commonly known as:  COLACE Take 100 mg by mouth daily.   furosemide 20 MG tablet Commonly known as:  LASIX Take 1 tablet (20 mg total) by mouth every other day.   HYDROcodone-acetaminophen 10-325 MG tablet Commonly known as:  NORCO Take 1 tablet by mouth every 4 (four) hours as needed for moderate pain. Do not exceed 3gm of Tylenol in 24 hours   LORazepam 0.5 MG tablet Commonly known as:  ATIVAN Take 0.5 mg by mouth at bedtime.   magnesium hydroxide 400 MG/5ML suspension Commonly known as:  MILK OF MAGNESIA Take 10 mLs by mouth daily.   nitroGLYCERIN 0.4 MG SL tablet Commonly known as:  NITROSTAT Place 1 tablet (0.4 mg total) under the tongue every 5 (five) minutes as needed for chest pain.   ondansetron 4 MG tablet Commonly known as:  ZOFRAN Take 4 mg by mouth every 8  (eight) hours.   Potassium Chloride ER  20 MEQ Tbcr Take 20 mEq by mouth daily.   traZODone 50 MG tablet Commonly known as:  DESYREL Take 50 mg by mouth at bedtime. May give 1-2 after scheduled dose if no sleep   ZOLOFT 100 MG tablet Generic drug:  sertraline Take 150 mg by mouth daily.       Review of Systems  Constitutional: Negative for activity change, appetite change, chills, fatigue and fever.  HENT: Negative for congestion, rhinorrhea, sinus pain, sinus pressure, sneezing and sore throat.   Eyes: Negative.   Respiratory: Negative for cough, chest tightness, shortness of breath and wheezing.   Cardiovascular: Negative for chest pain, palpitations and leg swelling.  Gastrointestinal: Negative for abdominal distention, abdominal pain, constipation, diarrhea and vomiting.       Chronic Nausea  Endocrine: Negative for cold intolerance, heat intolerance, polydipsia, polyphagia and polyuria.  Genitourinary: Negative for dysuria, flank pain, frequency and urgency.  Musculoskeletal: Positive for gait problem.  Skin: Negative for color change, pallor and rash.  Neurological: Negative for dizziness, seizures, light-headedness and headaches.  Hematological: Does not bruise/bleed easily.  Psychiatric/Behavioral: Negative for agitation, confusion, hallucinations and sleep disturbance. The patient is not nervous/anxious.     Vitals:   02/28/17 0923  BP: 123/70  Pulse: 73  Resp: 19  Temp: 97.3 F (36.3 C)  TempSrc: Oral  SpO2: 96%  Weight: 235 lb (106.6 kg)  Height: 5\' 4"  (1.626 m)   Body mass index is 40.34 kg/m. Physical Exam  Constitutional: She appears well-developed and well-nourished. No distress.  HENT:  Head: Normocephalic.  Mouth/Throat: Oropharynx is clear and moist. No oropharyngeal exudate.  Eyes: Conjunctivae and EOM are normal. Pupils are equal, round, and reactive to light. Right eye exhibits no discharge. Left eye exhibits no discharge. No scleral icterus.    Neck: Normal range of motion. No JVD present. No thyromegaly present.  Cardiovascular: Normal rate, regular rhythm, normal heart sounds and intact distal pulses.  Exam reveals no gallop and no friction rub.   No murmur heard. Pulmonary/Chest: Effort normal and breath sounds normal. No respiratory distress. She has no wheezes. She has no rales.  Abdominal: Soft. Bowel sounds are normal. She exhibits no distension. There is no tenderness. There is no rebound and no guarding.  Genitourinary:  Genitourinary Comments: Incontinent   Musculoskeletal: Normal range of motion. She exhibits no edema, tenderness or deformity.  Unsteady gait bilateral lower extremities weakness.  Lymphadenopathy:    She has no cervical adenopathy.  Neurological: She is alert.  Intermittent confusion   Skin: Skin is warm and dry. No rash noted. No erythema. No pallor.  Psychiatric: She has a normal mood and affect.    Labs reviewed: Basic Metabolic Panel:  Recent Labs  11/02/16 0522  11/03/16 0328 11/05/16 0540  11/06/16 0413  11/19/16 0531 11/20/16 02/07/17 02/12/17  NA 143  --  143  --   --   --   < > 141 145 145 142  K 3.1*  < > 3.7 3.1*  < > 3.4*  < > 3.3* 3.5 3.6 3.1*  CL 107  --  110  --   --   --   --  102  --   --   --   CO2 29  --  26  --   --   --   --  29  --   --   --   GLUCOSE 136*  --  137*  --   --   --   --  140*  --   --   --   BUN <5*  --  6  --   --   --   < > 6 8 9 8   CREATININE 0.51  --  0.58  --   --   --   < > 0.54 0.5 0.6 0.7  CALCIUM 8.2*  --  8.6*  --   --   --   --  9.5  --   --   --   MG  --   --  1.8 1.7  --  2.1  --   --   --   --   --   PHOS  --   --  2.1*  --   --   --   --   --   --   --   --   < > = values in this interval not displayed. Liver Function Tests:  Recent Labs  10/07/16 1805 10/11/16 2145 11/01/16 0613 02/07/17  AST 47* 46* 34 21  ALT 22 21 17 11   ALKPHOS 87 79 86 81  BILITOT 0.9 0.6 0.5  --   PROT 8.1 7.9 7.6  --   ALBUMIN 3.9 3.7 3.5  --      Recent Labs  09/17/16 1115 10/07/16 1805 10/11/16 2145  LIPASE 26 36 33   CBC:  Recent Labs  10/11/16 2145  11/01/16 0613 11/02/16 0522 11/13/16 11/19/16 0531  WBC 7.1  < > 4.9 5.2 4.6 4.8  NEUTROABS 4.3  --   --   --  41 2.7  HGB 12.1  < > 11.7* 10.7* 10.3* 11.4*  HCT 38.1  < > 36.1 33.2* 34* 35.6  MCV 82.8  < > 82.3 82.6  --  81.7  PLT 279  < > 271 260 297 320  < > = values in this interval not displayed. Cardiac Enzymes:  Recent Labs  09/17/16 1115  10/07/16 1805 10/11/16 2145 11/19/16 0531  CKTOTAL 46  --   --   --  25*  TROPONINI <0.03  < > <0.03 <0.03 <0.03  < > = values in this interval not displayed. BNP: Invalid input(s): POCBNP CBG:  Recent Labs  11/05/16 0736 11/06/16 0739 11/06/16 1215  GLUCAP 154* 138* 131*   Assessment/Plan:   1. Unsteady gait Has worked well with PT/ OT but remains high risk for falls. Will discharge to ALF with  PT/OT to continue with ROM, Exercise, Gait stability and muscle strengthening. She will require a standard WC with Cushion, anti tippers, extended brake handles, removable elevating leg rests to enable her to maintain current level of independence with ADL's which cannot be achieved with walker or cane. Fall and safety precautions.  2. Hypertensive heart disease with CHF (congestive heart failure) B/p stable. Continue on Diltiazem. BMP in 1-2 weeks with PCP    3. Chronic diastolic heart failure Appears compensated. Exam findings negative. Continue on furosemide.NAS diet with 1.5 Liters  fluid restrictions.Monitor daily weight.   4. Coronary artery disease  Chest pain free.continue to control high risk factors. Continue on ASA,Diltiazem and  Statin  5. Atrial fibrillation with RVR HR controlled. Continue on Diltiazem and ASA.   6. Aortic stenosis, severe Chronic.not a candidate for surgery.   7. Slow transit constipation Current regimen effective.  8. Nausea and vomiting  status post short term  rehabilitation for post hospital admission11/29/17-12/4/17 with nausea and vomiting.She has had some outpatient workup with EGD which have been unrevealing.Continue on  Zofran.follow up with GI.    9. Weakness of both legs Will discharge with HH: PT/OT for ROM, exercise, gait stability and muscle strengthening. Fall and safety precautions.   10. Mixed hyperlipidemia Continue on Lipitor. Monitor Lipid panel.   11. Fibromyalgia Continue current pain regimen.   12. Depression Stable. Seen by Psychiatry services 02/09/2017 her Zoloft increased to 150 mg Tablet.Continue zoloft and trazodone. Continue to monitor for mood changes.  13. Generalized anxiety disorder Has improved.seen by Psychiatry services 02/09/2017 her Zoloft increased to 150 mg Tablet.    Patient is being discharged with the following home health services:   -PT/OT for ROM, exercise, gait stability and muscle strengthening  Patient is being discharged with the following durable medical equipment:    - A standard WC with Cushion, anti tippers, extended brake handles, removable elevating leg rests to enable her to maintain current level of independence with ADL's which cannot be achieved with walker or cane.   Patient has been advised to f/u with their PCP in 1-2 weeks to for a transitions of care visit.Social services at their facility was responsible for arranging this appointment.  Pt was provided with adequate prescriptions of noncontrolled medications to reach the scheduled appointment.For controlled substances, a limited supply was provided as appropriate for the individual patient. If the pt normally receives these medications from a pain clinic or has a contract with another physician, these medications should be received from that clinic or physician only).    Future labs/tests needed:  CBC, BMP in 1-2 weeks PCP

## 2017-03-05 ENCOUNTER — Inpatient Hospital Stay
Admission: EM | Admit: 2017-03-05 | Discharge: 2017-03-09 | DRG: 392 | Disposition: A | Discharge status: Home / Self Care | Payer: Medicare Other | Attending: Internal Medicine | Admitting: Internal Medicine

## 2017-03-05 DIAGNOSIS — M797 Fibromyalgia: Secondary | ICD-10-CM | POA: Diagnosis present

## 2017-03-05 DIAGNOSIS — E860 Dehydration: Secondary | ICD-10-CM | POA: Diagnosis present

## 2017-03-05 DIAGNOSIS — F329 Major depressive disorder, single episode, unspecified: Secondary | ICD-10-CM | POA: Diagnosis present

## 2017-03-05 DIAGNOSIS — R112 Nausea with vomiting, unspecified: Secondary | ICD-10-CM | POA: Diagnosis present

## 2017-03-05 DIAGNOSIS — G629 Polyneuropathy, unspecified: Secondary | ICD-10-CM | POA: Diagnosis present

## 2017-03-05 DIAGNOSIS — I5032 Chronic diastolic (congestive) heart failure: Secondary | ICD-10-CM | POA: Diagnosis present

## 2017-03-05 DIAGNOSIS — I2510 Atherosclerotic heart disease of native coronary artery without angina pectoris: Secondary | ICD-10-CM | POA: Diagnosis present

## 2017-03-05 DIAGNOSIS — K219 Gastro-esophageal reflux disease without esophagitis: Secondary | ICD-10-CM | POA: Diagnosis present

## 2017-03-05 DIAGNOSIS — I110 Hypertensive heart disease with heart failure: Secondary | ICD-10-CM | POA: Diagnosis present

## 2017-03-05 DIAGNOSIS — Z951 Presence of aortocoronary bypass graft: Secondary | ICD-10-CM | POA: Diagnosis not present

## 2017-03-05 DIAGNOSIS — E119 Type 2 diabetes mellitus without complications: Secondary | ICD-10-CM | POA: Diagnosis present

## 2017-03-05 DIAGNOSIS — R110 Nausea: Secondary | ICD-10-CM

## 2017-03-05 DIAGNOSIS — I480 Paroxysmal atrial fibrillation: Secondary | ICD-10-CM | POA: Diagnosis present

## 2017-03-05 DIAGNOSIS — Z96651 Presence of right artificial knee joint: Secondary | ICD-10-CM | POA: Diagnosis present

## 2017-03-05 DIAGNOSIS — E876 Hypokalemia: Secondary | ICD-10-CM | POA: Diagnosis present

## 2017-03-05 DIAGNOSIS — Z79899 Other long term (current) drug therapy: Secondary | ICD-10-CM | POA: Diagnosis not present

## 2017-03-05 DIAGNOSIS — E785 Hyperlipidemia, unspecified: Secondary | ICD-10-CM | POA: Diagnosis present

## 2017-03-05 DIAGNOSIS — Z96649 Presence of unspecified artificial hip joint: Secondary | ICD-10-CM | POA: Diagnosis present

## 2017-03-05 DIAGNOSIS — Z6841 Body Mass Index (BMI) 40.0 and over, adult: Secondary | ICD-10-CM | POA: Diagnosis not present

## 2017-03-05 DIAGNOSIS — I350 Nonrheumatic aortic (valve) stenosis: Secondary | ICD-10-CM | POA: Diagnosis present

## 2017-03-05 DIAGNOSIS — Z79891 Long term (current) use of opiate analgesic: Secondary | ICD-10-CM | POA: Diagnosis not present

## 2017-03-05 DIAGNOSIS — R197 Diarrhea, unspecified: Secondary | ICD-10-CM

## 2017-03-05 DIAGNOSIS — Z950 Presence of cardiac pacemaker: Secondary | ICD-10-CM

## 2017-03-05 DIAGNOSIS — Z952 Presence of prosthetic heart valve: Secondary | ICD-10-CM

## 2017-03-05 DIAGNOSIS — Z66 Do not resuscitate: Secondary | ICD-10-CM | POA: Diagnosis present

## 2017-03-05 DIAGNOSIS — M4800 Spinal stenosis, site unspecified: Secondary | ICD-10-CM | POA: Diagnosis present

## 2017-03-05 DIAGNOSIS — Z7982 Long term (current) use of aspirin: Secondary | ICD-10-CM

## 2017-03-05 DIAGNOSIS — A084 Viral intestinal infection, unspecified: Principal | ICD-10-CM | POA: Diagnosis present

## 2017-03-05 LAB — CBC WITH DIFFERENTIAL/PLATELET
Basophils Absolute: 0.1 K/uL (ref 0–0.1)
Basophils Relative: 1 %
Eosinophils Absolute: 0 K/uL (ref 0–0.7)
Eosinophils Relative: 0 %
HCT: 38.4 % (ref 35.0–47.0)
Hemoglobin: 12.3 g/dL (ref 12.0–16.0)
Lymphocytes Relative: 18 %
Lymphs Abs: 1.6 K/uL (ref 1.0–3.6)
MCH: 26.2 pg (ref 26.0–34.0)
MCHC: 31.9 g/dL — ABNORMAL LOW (ref 32.0–36.0)
MCV: 82 fL (ref 80.0–100.0)
Monocytes Absolute: 0.6 K/uL (ref 0.2–0.9)
Monocytes Relative: 7 %
Neutro Abs: 6.5 K/uL (ref 1.4–6.5)
Neutrophils Relative %: 74 %
Platelets: 283 K/uL (ref 150–440)
RBC: 4.69 MIL/uL (ref 3.80–5.20)
RDW: 18.2 % — ABNORMAL HIGH (ref 11.5–14.5)
WBC: 8.7 K/uL (ref 3.6–11.0)

## 2017-03-05 LAB — COMPREHENSIVE METABOLIC PANEL
ALK PHOS: 68 U/L (ref 38–126)
ALT: 59 U/L — ABNORMAL HIGH (ref 14–54)
ANION GAP: 11 (ref 5–15)
AST: 221 U/L — ABNORMAL HIGH (ref 15–41)
Albumin: 3.9 g/dL (ref 3.5–5.0)
BILIRUBIN TOTAL: 0.8 mg/dL (ref 0.3–1.2)
BUN: 8 mg/dL (ref 6–20)
CALCIUM: 8.9 mg/dL (ref 8.9–10.3)
CO2: 26 mmol/L (ref 22–32)
CREATININE: 0.72 mg/dL (ref 0.44–1.00)
Chloride: 102 mmol/L (ref 101–111)
GFR calc non Af Amer: >60 mL/min (ref >60)
GLUCOSE: 116 mg/dL — AB (ref 65–99)
Potassium: 3 mmol/L — ABNORMAL LOW (ref 3.5–5.1)
SODIUM: 139 mmol/L (ref 135–145)
TOTAL PROTEIN: 7.6 g/dL (ref 6.5–8.1)

## 2017-03-05 LAB — URINALYSIS, COMPLETE (UACMP) WITH MICROSCOPIC
Bilirubin Urine: NEGATIVE
Glucose, UA: NEGATIVE mg/dL
Ketones, ur: NEGATIVE mg/dL
Leukocytes, UA: NEGATIVE
Nitrite: NEGATIVE
Protein, ur: NEGATIVE mg/dL
RBC / HPF: NONE SEEN RBC/hpf (ref 0–5)
Specific Gravity, Urine: 1.006 (ref 1.005–1.030)
pH: 7 (ref 5.0–8.0)

## 2017-03-05 LAB — LIPASE, BLOOD: Lipase: 24 U/L (ref 11–51)

## 2017-03-05 LAB — INFLUENZA PANEL BY PCR (TYPE A & B)
Influenza A By PCR: NEGATIVE
Influenza B By PCR: NEGATIVE

## 2017-03-05 LAB — TROPONIN I
TROPONIN I: 0.03 ng/mL — AB (ref <0.03)
Troponin I: 0.03 ng/mL (ref <0.03)

## 2017-03-05 MED ORDER — MORPHINE SULFATE (PF) 4 MG/ML IV SOLN
INTRAVENOUS | Status: AC
  Administered 2017-03-05: 4 mg via INTRAVENOUS
  Filled 2017-03-05: qty 1

## 2017-03-05 MED ORDER — ACETAMINOPHEN 500 MG PO TABS
1000.0000 mg | ORAL_TABLET | ORAL | Status: AC
  Administered 2017-03-05: 1000 mg via ORAL

## 2017-03-05 MED ORDER — ACETAMINOPHEN 500 MG PO TABS
ORAL_TABLET | ORAL | Status: AC
  Administered 2017-03-05: 1000 mg via ORAL
  Filled 2017-03-05: qty 2

## 2017-03-05 MED ORDER — PROMETHAZINE HCL 25 MG/ML IJ SOLN
INTRAMUSCULAR | Status: AC
  Administered 2017-03-05: 12.5 mg via INTRAVENOUS
  Filled 2017-03-05: qty 1

## 2017-03-05 MED ORDER — PROMETHAZINE HCL 25 MG/ML IJ SOLN
12.5000 mg | Freq: Once | INTRAMUSCULAR | Status: AC
  Administered 2017-03-05: 12.5 mg via INTRAVENOUS

## 2017-03-05 MED ORDER — SODIUM CHLORIDE 0.9 % IV BOLUS (SEPSIS)
1000.0000 mL | Freq: Once | INTRAVENOUS | Status: AC
  Administered 2017-03-05: 1000 mL via INTRAVENOUS

## 2017-03-05 MED ORDER — POTASSIUM CHLORIDE CRYS ER 20 MEQ PO TBCR
40.0000 meq | EXTENDED_RELEASE_TABLET | Freq: Once | ORAL | Status: AC
  Administered 2017-03-05: 40 meq via ORAL
  Filled 2017-03-05: qty 2

## 2017-03-05 MED ORDER — MORPHINE SULFATE (PF) 4 MG/ML IV SOLN
4.0000 mg | INTRAVENOUS | Status: AC
  Administered 2017-03-05: 4 mg via INTRAVENOUS

## 2017-03-05 MED ORDER — ONDANSETRON HCL 4 MG/2ML IJ SOLN
4.0000 mg | Freq: Once | INTRAMUSCULAR | Status: AC
  Administered 2017-03-05: 4 mg via INTRAVENOUS
  Filled 2017-03-05: qty 2

## 2017-03-05 NOTE — H&P (Signed)
[facility] NAME: Michele Schultz    MR#:  [id number]  DATE OF BIRTH:  May 11, 1931  DATE OF ADMISSION:  03/05/2017  PRIMARY CARE PHYSICIAN: [name], MD   REQUESTING/REFERRING PHYSICIAN: Orbie Pyo, MD  CHIEF COMPLAINT:   Chief Complaint  Patient presents with  . Emesis    HISTORY OF PRESENT ILLNESS:  Michele Schultz  is a 83 y.o. female with a known history of Severe aortic stenosis, chronic diastolic heart failure, morbid obesity, proximal atrial fibrillation comes in because of intractable nausea, vomiting, diarrhea for the past 1-2 days. She said that she had 9 episodes of diarrhea since 9:00 this morning. Also, multiple episodes of vomiting.  She was somewhat lethargic while in the ED and is being admitted for further evaluation and management.  She has had multiple admissions in the past for same etiology. PAST MEDICAL HISTORY:   Past Medical History:  Diagnosis Date  . Anemia   . Aortic stenosis, severe    a. s/p Magna Ease pericardial tissue valve size 21 mm replacement in 10/2011 for severe AS 11/12; b. echo 07/2015: EF 55-60% mod concentric LVH, GR1DD, LA mildly dilated, PASP 45 mm Hg  . Atrial tachycardia, paroxysmal (HCC)    a. with rate related LBBB.  . Cardiac pacemaker -st Judes    11/12  . Chronic diastolic heart failure ([place])    a. echo 2014: EF 55-60%, no RWMA, GR1DD, PASP 47 mm Hg; b. echo 07/2015: EF 55-60% mod concentric LVH, GR1DD, LA mildly dilated, PASP 45 mm Hg  . Complete heart block [facility])    a. s/p St Jude PPM 10/2011 (Ser # [id number]).  . Coronary artery disease    a. s/p 2v CABG 11/12 (VG-LAD, VG-OM1); b. Lexiscan 08/2015: low risk, no ischemia, EF 55-65%.  . Enthesopathy of hip region   . Esophageal reflux    followed by Dr.Seigal. stabilized with a combination of Nexium and Zantac  . Fibromyalgia   . HLD (hyperlipidemia)   . Hypertensive heart disease   . Knee joint  replacement by other means   . Morbid obesity ([place])   . Neuralgia, neuritis, and radiculitis, unspecified   . Neuropathy ([place])   . Onychia and paronychia of toe   . Osteoarthrosis, unspecified whether generalized or localized, pelvic region and thigh    mainly in her back and knees  . PAF (paroxysmal atrial fibrillation) ([place])    a. brief episodes of AF previously noted on device interrogations.  Marland Kitchen RAD (reactive airway disease)    a. chronic SOB  . Spinal stenosis   . Stress incontinence, female    followed by Dr.Cope  . Type II or unspecified type diabetes mellitus without mention of complication, not stated as uncontrolled    a. pt. reports that she is borderline   . Wide-complex tachycardia ([place])    a. Noted 4/10 - brief episode in ED. Noted again 4/21 in clinic->felt most likely to be atrial tach.    PAST SURGICAL HISTORY:   Past Surgical History:  Procedure Laterality Date  . AORTIC VALVE REPLACEMENT  10/17/2011   Procedure: AORTIC VALVE REPLACEMENT (AVR);  Surgeon: Melrose Nakayama, MD;  Location: [place];  Service: Open Heart Surgery;  Laterality: N/A;  . CARDIAC CATHETERIZATION  08/2009   50% stenosis distal left main, 50% stenosis ostial left circumflex.   Marland Kitchen CARDIAC CATHETERIZATION     at [facility]  . CORONARY ARTERY BYPASS GRAFT  10/17/2011   Procedure: CORONARY ARTERY BYPASS GRAFTING (CABG);  Surgeon: Melrose Nakayama, MD;  Location: [place];  Service: Open Heart Surgery;  Laterality: N/A;  Times Two, using right leg greater saphenous vein harvested endoscopically  . ESOPHAGOGASTRODUODENOSCOPY (EGD) WITH PROPOFOL N/A 10/12/2016   Procedure: ESOPHAGOGASTRODUODENOSCOPY (EGD) WITH PROPOFOL;  Surgeon: Lucilla Lame, MD;  Location: [facility] ENDOSCOPY;  Service: Endoscopy;  Laterality: N/A;  . EYE SURGERY     IOL/ after cataracts removed- bilateral   . NASAL SINUS SURGERY  2009  . PERMANENT PACEMAKER INSERTION N/A 10/20/2011   Procedure: PERMANENT PACEMAKER INSERTION;  Surgeon:  Thompson Grayer, MD;  Location: [facility] CATH LAB;  Service: Cardiovascular;  Laterality: N/A;  . REPLACEMENT TOTAL KNEE  11/2006   right knee  . TOTAL HIP ARTHROPLASTY  09/2010    SOCIAL HISTORY:   Social History  Substance Use Topics  . Smoking status: Never Smoker  . Smokeless tobacco: Never Used  . Alcohol use No    FAMILY HISTORY:   Family History  Problem Relation Age of Onset  . Heart disease Sister   . Heart disease Brother   . Heart disease Brother   . Heart disease Brother   . Diabetes Other   . Anesthesia problems Neg Hx   . Hypotension Neg Hx   . Malignant hyperthermia Neg Hx   . Pseudochol deficiency Neg Hx     DRUG ALLERGIES:   Allergies  Allergen Reactions  . Citalopram Other (See Comments)    Reaction:  Altered mental status   . Cymbalta [Duloxetine Hcl] Other (See Comments)    Reaction:  Sedative for pt   . Imipramine Other (See Comments)    Reaction:  Unknown   . Proton Pump Inhibitors Other (See Comments)    Reaction:  Unknown   . Venlafaxine Nausea And Vomiting and Other (See Comments)    Reaction:  Dizziness     REVIEW OF SYSTEMS:   Review of Systems  Constitutional: Positive for malaise/fatigue. Negative for chills, fever and weight loss.  HENT: Negative for nosebleeds and sore throat.   Eyes: Negative for blurred vision.  Respiratory: Negative for cough, shortness of breath and wheezing.   Cardiovascular: Negative for chest pain, orthopnea, leg swelling and PND.  Gastrointestinal: Positive for diarrhea, nausea and vomiting. Negative for abdominal pain, constipation and heartburn.  Genitourinary: Negative for dysuria and urgency.  Musculoskeletal: Negative for back pain.  Skin: Negative for rash.  Neurological: Positive for weakness. Negative for dizziness, speech change, focal weakness and headaches.  Endo/Heme/Allergies: Does not bruise/bleed easily.  Psychiatric/Behavioral: Negative for depression.   MEDICATIONS AT HOME:   Prior to  Admission medications   Medication Sig Start Date End Date Taking? Authorizing Provider  acidophilus (RISAQUAD) CAPS capsule Take 1 capsule by mouth 2 (two) times daily. 06/26/16  Yes Bettey Costa, MD  aluminum-magnesium hydroxide-simethicone (MAALOX) 062-694-85 MG/5ML SUSP Take 30 mLs by mouth every 4 (four) hours as needed.   Yes Historical Provider, MD  aspirin EC 81 MG tablet Take 81 mg by mouth daily.    Yes Historical Provider, MD  atorvastatin (LIPITOR) 80 MG tablet Take 80 mg by mouth daily.   Yes Historical Provider, MD  calcium carbonate (OS-CAL) 600 MG TABS tablet Take 600 mg by mouth daily.   Yes Historical Provider, MD  Cholecalciferol 1000 units capsule Take 1,000 Units by mouth daily.    Yes Historical Provider, MD  diltiazem (CARDIZEM CD) 180 MG 24 hr capsule Take 1 capsule (180 mg  total) by mouth daily. 11/06/16  Yes Gladstone Lighter, MD  docusate sodium (COLACE) 100 MG capsule Take 100 mg by mouth daily.   Yes Historical Provider, MD  furosemide (LASIX) 20 MG tablet Take 1 tablet (20 mg total) by mouth every other day. Patient taking differently: Take 20 mg by mouth daily.  04/06/16  Yes Rogelia Mire, NP  HYDROcodone-acetaminophen (NORCO) 10-325 MG tablet Take 1 tablet by mouth every 4 (four) hours as needed for moderate pain. Do not exceed 3gm of Tylenol in 24 hours 12/19/16  Yes Lauree Chandler, NP  LORazepam (ATIVAN) 0.5 MG tablet Take 0.5 mg by mouth at bedtime.   Yes Historical Provider, MD  magnesium hydroxide (MILK OF MAGNESIA) 400 MG/5ML suspension Take 10 mLs by mouth daily.    Yes Historical Provider, MD  nitroGLYCERIN (NITROSTAT) 0.4 MG SL tablet Place 1 tablet (0.4 mg total) under the tongue every 5 (five) minutes as needed for chest pain. 01/24/13  Yes Minna Merritts, MD  ondansetron (ZOFRAN) 8 MG tablet Take 8 mg by mouth every 8 (eight) hours as needed for nausea or vomiting.   Yes Historical Provider, MD  Potassium Chloride ER 20 MEQ TBCR Take 20 mEq by mouth  daily.    Yes Historical Provider, MD  sertraline (ZOLOFT) 50 MG tablet Take 150 mg by mouth at bedtime.    Yes Historical Provider, MD      VITAL SIGNS:  Blood pressure 140/77, pulse 89, temperature 98.3 F (36.8 C), temperature source Oral, resp. rate 14, height 5\' 4"  (1.626 m), weight 113.4 kg (250 lb), SpO2 97 %.  PHYSICAL EXAMINATION:  Physical Exam  GENERAL:  83 y.o.-year-old patient lying in the bed with no acute distress.  EYES: Pupils equal, round, reactive to light and accommodation. No scleral icterus. Extraocular muscles intact.  HEENT: Head atraumatic, normocephalic. Oropharynx and nasopharynx clear.  NECK:  Supple, no jugular venous distention. No thyroid enlargement, no tenderness.  LUNGS: Normal breath sounds bilaterally, no wheezing, rales,rhonchi or crepitation. No use of accessory muscles of respiration.  CARDIOVASCULAR: S1, S2 normal. No murmurs, rubs, or gallops.  ABDOMEN: Soft, nontender, nondistended. Bowel sounds present. No organomegaly or mass.  EXTREMITIES: No pedal edema, cyanosis, or clubbing.  NEUROLOGIC: Cranial nerves II through XII are intact. Muscle strength 5/5 in all extremities. Sensation intact. Gait not checked.  PSYCHIATRIC: The patient is alert and oriented x 3.  SKIN: No obvious rash, lesion, or ulcer.   LABORATORY PANEL:   CBC  Recent Labs Lab 03/05/17 1526  WBC 8.7  HGB 12.3  HCT 38.4  PLT 283   ------------------------------------------------------------------------------------------------------------------  Chemistries   Recent Labs Lab 03/05/17 1526  NA 139  K 3.0*  CL 102  CO2 26  GLUCOSE 116*  BUN 8  CREATININE 0.72  CALCIUM 8.9  AST 221*  ALT 59*  ALKPHOS 68  BILITOT 0.8   ------------------------------------------------------------------------------------------------------------------  Cardiac Enzymes  Recent Labs Lab 03/05/17 1826  TROPONINI 0.03*    ------------------------------------------------------------------------------------------------------------------  RADIOLOGY:  No results found.    IMPRESSION AND PLAN:  83 year old female with known history of Severe aortic stenosis, chronic diastolic heart failure, morbid obesity, proximal atrial fibrillation being admitted for intractable nausea, vomiting, diarrhea  * Intractable nausea, vomiting, diarrhea likely secondary to viral gastroenteritis:  - Aggressive IV hydration.  Check stool for C. difficile and other organism  * severe hypokalemia secondary to vomiting, GI losses:  - Replete and recheck - Check magnesium  * severe aortic stenosis, chronic diastolic  heart failure: Patient on Lasix, hold the Lasix at this time because of her nausea, vomiting, dehydration.  * Paroxymal atrial fibrillation, history of pacemaker placement: Patient is on Cardizem at home.   All the records are reviewed and case discussed with ED provider. Management plans discussed with the patient, Nursing and they are in agreement.  CODE STATUS: DO NOT RESUSCITATE  TOTAL TIME TAKING CARE OF THIS PATIENT: 45 minutes.    Max Sane M.D on 03/05/2017 at 10:46 PM  Between 7am to 6pm - Pager - [phone]  After 6pm go to [email or web address] - Proofreader  [facility]  Office  [phone]  CC: Primary care physician; [name], MD   Note: This dictation was prepared with Dragon dictation along with smaller phrase technology. Any transcriptional errors that result from this process are unintentional.

## 2017-03-05 NOTE — ED Provider Notes (Signed)
[facility] Emergency Department Provider Note  ____________________________________________   First MD Initiated Contact with Patient 03/05/17 1513     (approximate)  I have reviewed the triage vital signs and the nursing notes.   HISTORY  Chief Complaint Emesis   HPI Michele Schultz is a 83 y.o. female with a history of aortic stenosis as well as congestive heart failure who is presenting to the emergency department with nausea vomiting and diarrhea over the past 24 hours. She said that she had 9 episodes of diarrhea since 9:00 this morning. Denies any blood in the diarrhea over the vomitus. Denies any recent antibiotics. Denies any pain at this time and says she feels very nauseous and uncomfortable feeling in her upper abdomen. Known sick contacts.   Past Medical History:  Diagnosis Date  . Anemia   . Aortic stenosis, severe    a. s/p Magna Ease pericardial tissue valve size 21 mm replacement in 10/2011 for severe AS 11/12; b. echo 07/2015: EF 55-60% mod concentric LVH, GR1DD, LA mildly dilated, PASP 45 mm Hg  . Atrial tachycardia, paroxysmal (HCC)    a. with rate related LBBB.  . Cardiac pacemaker -st Judes    11/12  . Chronic diastolic heart failure ([place])    a. echo 2014: EF 55-60%, no RWMA, GR1DD, PASP 47 mm Hg; b. echo 07/2015: EF 55-60% mod concentric LVH, GR1DD, LA mildly dilated, PASP 45 mm Hg  . Complete heart block [facility])    a. s/p St Jude PPM 10/2011 (Ser # [id number]).  . Coronary artery disease    a. s/p 2v CABG 11/12 (VG-LAD, VG-OM1); b. Lexiscan 08/2015: low risk, no ischemia, EF 55-65%.  . Enthesopathy of hip region   . Esophageal reflux    followed by Dr.Seigal. stabilized with a combination of Nexium and Zantac  . Fibromyalgia   . HLD (hyperlipidemia)   . Hypertensive heart disease   . Knee joint replacement by other means   . Morbid obesity ([place])   . Neuralgia, neuritis, and radiculitis, unspecified   . Neuropathy ([place])   .  Onychia and paronychia of toe   . Osteoarthrosis, unspecified whether generalized or localized, pelvic region and thigh    mainly in her back and knees  . PAF (paroxysmal atrial fibrillation) ([place])    a. brief episodes of AF previously noted on device interrogations.  Marland Kitchen RAD (reactive airway disease)    a. chronic SOB  . Spinal stenosis   . Stress incontinence, female    followed by Dr.Cope  . Type II or unspecified type diabetes mellitus without mention of complication, not stated as uncontrolled    a. pt. reports that she is borderline   . Wide-complex tachycardia ([place])    a. Noted 4/10 - brief episode in ED. Noted again 4/21 in clinic->felt most likely to be atrial tach.    Patient Active Problem List   Diagnosis Date Noted  . Adjustment disorder with depressed mood 11/02/2016  . Nausea & vomiting 11/01/2016  . Nausea and vomiting   . Atrial fibrillation with RVR ([place]) 10/11/2016  . Hypokalemia 10/11/2016  . DNR (do not resuscitate) 09/19/2016  . Palliative care by specialist 09/19/2016  . Muscle weakness (generalized)   . Atrial tachycardia ([place])   . Diarrhea 09/17/2016  . Dehydration 09/17/2016  . Metabolic acidosis 19/41/7408  . Headache due to trauma 07/03/2016  . Wide-complex tachycardia ([place])   . Pain in the chest   . Emesis   .  Weakness of both legs   . Coronary artery disease involving coronary bypass graft of native heart with unspecified angina pectoris   . S/P AVR (aortic valve replacement)   . Falls frequently   . HLD (hyperlipidemia)   . Fibromyalgia   . RAD (reactive airway disease)   . Morbid obesity ([place])   . Aortic stenosis, severe   . Chronic pain 06/17/2014  . Pain in the muscles 06/19/2013  . Chest pain 06/19/2013  . Wheezing 06/19/2013  . Weakness generalized 06/03/2013  . Dyspnea 01/27/2013  . Nausea 03/05/2012  . Constipation 03/05/2012  . Atrial fibrillation-non sustained 02/09/2012  . Cardiac pacemaker -st Judes   . Chronic diastolic  heart failure ([place])   . Coronary artery disease   . Long term (current) use of anticoagulants 01/03/2012  . S/P CABG x 2 11/14/2011  . S/P aortic valve replacement with porcine valve 11/14/2011  . Back pain 11/14/2011  . Complete heart block ([place]) 10/20/2011  . Hypertensive heart disease with CHF (congestive heart failure) ([place]) 09/15/2011  . Spinal stenosis 03/22/2011  . Fatigue 03/22/2011  . Hyperlipidemia 03/22/2011    Past Surgical History:  Procedure Laterality Date  . AORTIC VALVE REPLACEMENT  10/17/2011   Procedure: AORTIC VALVE REPLACEMENT (AVR);  Surgeon: Melrose Nakayama, MD;  Location: [place];  Service: Open Heart Surgery;  Laterality: N/A;  . CARDIAC CATHETERIZATION  08/2009   50% stenosis distal left main, 50% stenosis ostial left circumflex.   Marland Kitchen CARDIAC CATHETERIZATION     at [facility]  . CORONARY ARTERY BYPASS GRAFT  10/17/2011   Procedure: CORONARY ARTERY BYPASS GRAFTING (CABG);  Surgeon: Melrose Nakayama, MD;  Location: [place];  Service: Open Heart Surgery;  Laterality: N/A;  Times Two, using right leg greater saphenous vein harvested endoscopically  . ESOPHAGOGASTRODUODENOSCOPY (EGD) WITH PROPOFOL N/A 10/12/2016   Procedure: ESOPHAGOGASTRODUODENOSCOPY (EGD) WITH PROPOFOL;  Surgeon: Lucilla Lame, MD;  Location: [facility] ENDOSCOPY;  Service: Endoscopy;  Laterality: N/A;  . EYE SURGERY     IOL/ after cataracts removed- bilateral   . NASAL SINUS SURGERY  2009  . PERMANENT PACEMAKER INSERTION N/A 10/20/2011   Procedure: PERMANENT PACEMAKER INSERTION;  Surgeon: Thompson Grayer, MD;  Location: [facility] CATH LAB;  Service: Cardiovascular;  Laterality: N/A;  . REPLACEMENT TOTAL KNEE  11/2006   right knee  . TOTAL HIP ARTHROPLASTY  09/2010    Prior to Admission medications   Medication Sig Start Date End Date Taking? Authorizing Provider  acidophilus (RISAQUAD) CAPS capsule Take 1 capsule by mouth 2 (two) times daily. 06/26/16  Yes Bettey Costa, MD  aluminum-magnesium  hydroxide-simethicone (MAALOX) 924-268-34 MG/5ML SUSP Take 30 mLs by mouth every 4 (four) hours as needed.   Yes Historical Provider, MD  aspirin EC 81 MG tablet Take 81 mg by mouth daily.    Yes Historical Provider, MD  atorvastatin (LIPITOR) 80 MG tablet Take 80 mg by mouth daily.   Yes Historical Provider, MD  calcium carbonate (OS-CAL) 600 MG TABS tablet Take 600 mg by mouth daily.   Yes Historical Provider, MD  Cholecalciferol 1000 units capsule Take 1,000 Units by mouth daily.    Yes Historical Provider, MD  diltiazem (CARDIZEM CD) 180 MG 24 hr capsule Take 1 capsule (180 mg total) by mouth daily. 11/06/16  Yes Gladstone Lighter, MD  docusate sodium (COLACE) 100 MG capsule Take 100 mg by mouth daily.   Yes Historical Provider, MD  furosemide (LASIX) 20 MG tablet Take 1 tablet (20 mg total) by  mouth every other day. Patient taking differently: Take 20 mg by mouth daily.  04/06/16  Yes Rogelia Mire, NP  HYDROcodone-acetaminophen (NORCO) 10-325 MG tablet Take 1 tablet by mouth every 4 (four) hours as needed for moderate pain. Do not exceed 3gm of Tylenol in 24 hours 12/19/16  Yes Lauree Chandler, NP  LORazepam (ATIVAN) 0.5 MG tablet Take 0.5 mg by mouth at bedtime.   Yes Historical Provider, MD  magnesium hydroxide (MILK OF MAGNESIA) 400 MG/5ML suspension Take 10 mLs by mouth daily.    Yes Historical Provider, MD  nitroGLYCERIN (NITROSTAT) 0.4 MG SL tablet Place 1 tablet (0.4 mg total) under the tongue every 5 (five) minutes as needed for chest pain. 01/24/13  Yes Minna Merritts, MD  ondansetron (ZOFRAN) 8 MG tablet Take 8 mg by mouth every 8 (eight) hours as needed for nausea or vomiting.   Yes Historical Provider, MD  Potassium Chloride ER 20 MEQ TBCR Take 20 mEq by mouth daily.    Yes Historical Provider, MD  sertraline (ZOLOFT) 50 MG tablet Take 150 mg by mouth at bedtime.    Yes Historical Provider, MD    Allergies Citalopram; Cymbalta [duloxetine hcl]; Imipramine; Proton pump  inhibitors; and Venlafaxine  Family History  Problem Relation Age of Onset  . Heart disease Sister   . Heart disease Brother   . Heart disease Brother   . Heart disease Brother   . Diabetes Other   . Anesthesia problems Neg Hx   . Hypotension Neg Hx   . Malignant hyperthermia Neg Hx   . Pseudochol deficiency Neg Hx     Social History Social History  Substance Use Topics  . Smoking status: Never Smoker  . Smokeless tobacco: Never Used  . Alcohol use No    Review of Systems Constitutional: No fever/chills Eyes: No visual changes. ENT: No sore throat. Cardiovascular: Denies chest pain. Respiratory: Denies shortness of breath. Gastrointestinal: No abdominal pain. No constipation. Genitourinary: Negative for dysuria. Musculoskeletal: Negative for back pain. Skin: Negative for rash. Neurological: Negative for headaches, focal weakness or numbness.  10-point ROS otherwise negative.  ____________________________________________   PHYSICAL EXAM:  VITAL SIGNS: ED Triage Vitals  Enc Vitals Group     BP 03/05/17 1505 125/66     Pulse Rate 03/05/17 1505 (!) 110     Resp 03/05/17 1505 20     Temp 03/05/17 1505 98.3 F (36.8 C)     Temp Source 03/05/17 1505 Oral     SpO2 03/05/17 1505 98 %     Weight 03/05/17 1506 250 lb (113.4 kg)     Height 03/05/17 1506 5\' 4"  (1.626 m)     Head Circumference --      Peak Flow --      Pain Score 03/05/17 1505 9     Pain Loc --      Pain Edu? --      Excl. in [place]? --     Constitutional: Alert and oriented. Well appearing and in no acute distress. Eyes: Conjunctivae are normal. PERRL. EOMI. Head: Atraumatic. Nose: No congestion/rhinnorhea. Mouth/Throat: Mucous membranes are moist.   Neck: No stridor.   Cardiovascular: Normal rate, regular rhythm. Grossly normal heart sounds.   Respiratory: Normal respiratory effort.  No retractions. Lungs CTAB. Gastrointestinal: Soft and nontender. No distention.  Musculoskeletal: No lower  extremity tenderness nor edema.  No joint effusions. Neurologic:  Normal speech and language. No gross focal neurologic deficits are appreciated.  Skin:  Skin is  warm, dry and intact. No rash noted. Psychiatric: Mood and affect are normal. Speech and behavior are normal.  ____________________________________________   LABS (all labs ordered are listed, but only abnormal results are displayed)  Labs Reviewed  CBC WITH DIFFERENTIAL/PLATELET - Abnormal; Notable for the following:       Result Value   MCHC 31.9 (*)    RDW 18.2 (*)    All other components within normal limits  COMPREHENSIVE METABOLIC PANEL - Abnormal; Notable for the following:    Potassium 3.0 (*)    Glucose, Bld 116 (*)    AST 221 (*)    ALT 59 (*)    All other components within normal limits  TROPONIN I - Abnormal; Notable for the following:    Troponin I 0.03 (*)    All other components within normal limits  TROPONIN I - Abnormal; Notable for the following:    Troponin I 0.03 (*)    All other components within normal limits  URINALYSIS, COMPLETE (UACMP) WITH MICROSCOPIC - Abnormal; Notable for the following:    Color, Urine YELLOW (*)    APPearance CLEAR (*)    Hgb urine dipstick MODERATE (*)    Bacteria, UA RARE (*)    Squamous Epithelial / LPF 0-5 (*)    All other components within normal limits  GASTROINTESTINAL PANEL BY PCR, STOOL (REPLACES STOOL CULTURE)  C DIFFICILE QUICK SCREEN W PCR REFLEX  LIPASE, BLOOD  INFLUENZA PANEL BY PCR (TYPE A & B)   ____________________________________________  EKG  ED ECG REPORT I, Doran Stabler, the attending physician, personally viewed and interpreted this ECG.   Date: 03/05/2017  EKG Time: 1528  Rate: 106  Rhythm: ventricular paced rythm  Axis: normal  Intervals:Wide complex consistent with ventricular paced rhythm.  ST&T Change: Diffuse ST elevations consistent with a ventricularly paced rhythm. T-wave inversions in  aVL.  ____________________________________________  RADIOLOGY   ____________________________________________   PROCEDURES  Procedure(s) performed:   Procedures  Critical Care performed:   ____________________________________________   INITIAL IMPRESSION / ASSESSMENT AND PLAN / ED COURSE  Pertinent labs & imaging results that were available during my care of the patient were reviewed by me and considered in my medical decision making (see chart for details).  ----------------------------------------- 9:54 PM on 03/05/2017 -----------------------------------------  Patient at this time is still having dry heaves even though she has had by mouth Zofran, IV Zofran and Phenergan. Abdomen is still diffusely nontender. However, the patient has intractable nausea and vomiting. One episode of diarrhea throughout her stay which she said she was not able to catch and a half. She will be admitted to the hospital this time. Signed out to Dr. Manuella Ghazi.  Patient is aware of the plan and willing to comply.      ____________________________________________   FINAL CLINICAL IMPRESSION(S) / ED DIAGNOSES  Nausea vomiting and diarrhea.    NEW MEDICATIONS STARTED DURING THIS VISIT:  New Prescriptions   No medications on file     Note:  This document was prepared using Dragon voice recognition software and may include unintentional dictation errors.    Orbie Pyo, MD 03/05/17 2156

## 2017-03-05 NOTE — ED Triage Notes (Signed)
Pt to ED from [facility] via [facility] c/o NVD. Per EMS pt NVD symptoms started 0900 this AM, staff administered zofran without relief. Pt reports 3 episodes of emesis, and numerous episodes of diarrhea. Pt alert and oriented in no acute distress at this time.

## 2017-03-06 LAB — COMPREHENSIVE METABOLIC PANEL
ALK PHOS: 57 U/L (ref 38–126)
ALT: 52 U/L (ref 14–54)
AST: 188 U/L — ABNORMAL HIGH (ref 15–41)
Albumin: 3.2 g/dL — ABNORMAL LOW (ref 3.5–5.0)
Anion gap: 10 (ref 5–15)
BUN: <5 mg/dL — ABNORMAL LOW (ref 6–20)
CO2: 22 mmol/L (ref 22–32)
CREATININE: 0.64 mg/dL (ref 0.44–1.00)
Calcium: 8.2 mg/dL — ABNORMAL LOW (ref 8.9–10.3)
Chloride: 104 mmol/L (ref 101–111)
Glucose, Bld: 124 mg/dL — ABNORMAL HIGH (ref 65–99)
Potassium: 2.5 mmol/L — CL (ref 3.5–5.1)
Sodium: 136 mmol/L (ref 135–145)
TOTAL PROTEIN: 6.5 g/dL (ref 6.5–8.1)
Total Bilirubin: 0.8 mg/dL (ref 0.3–1.2)

## 2017-03-06 LAB — CBC
HEMATOCRIT: 35.4 % (ref 35.0–47.0)
HEMOGLOBIN: 11.5 g/dL — AB (ref 12.0–16.0)
MCH: 27 pg (ref 26.0–34.0)
MCHC: 32.5 g/dL (ref 32.0–36.0)
MCV: 83.2 fL (ref 80.0–100.0)
Platelets: 245 10*3/uL (ref 150–440)
RBC: 4.26 MIL/uL (ref 3.80–5.20)
RDW: 18.5 % — ABNORMAL HIGH (ref 11.5–14.5)
WBC: 6.3 10*3/uL (ref 3.6–11.0)

## 2017-03-06 LAB — BASIC METABOLIC PANEL
ANION GAP: 7 (ref 5–15)
BUN: 6 mg/dL (ref 6–20)
CO2: 27 mmol/L (ref 22–32)
Calcium: 8.1 mg/dL — ABNORMAL LOW (ref 8.9–10.3)
Chloride: 108 mmol/L (ref 101–111)
Creatinine, Ser: 0.52 mg/dL (ref 0.44–1.00)
GFR calc non Af Amer: >60 mL/min (ref >60)
Glucose, Bld: 116 mg/dL — ABNORMAL HIGH (ref 65–99)
POTASSIUM: 2.7 mmol/L — AB (ref 3.5–5.1)
Sodium: 142 mmol/L (ref 135–145)

## 2017-03-06 LAB — GLUCOSE, CAPILLARY: Glucose-Capillary: 113 mg/dL — ABNORMAL HIGH (ref 65–99)

## 2017-03-06 LAB — MRSA PCR SCREENING: MRSA BY PCR: NEGATIVE

## 2017-03-06 LAB — MAGNESIUM: Magnesium: 1.7 mg/dL (ref 1.7–2.4)

## 2017-03-06 MED ORDER — CALCIUM CARBONATE 600 MG PO TABS: 600.0000 mg | ORAL_TABLET | Freq: Every day | ORAL | Status: DC

## 2017-03-06 MED ORDER — ASPIRIN EC 81 MG PO TBEC
81.0000 mg | DELAYED_RELEASE_TABLET | Freq: Every day | ORAL | Status: DC
  Administered 2017-03-06 – 2017-03-07 (×2): 81 mg via ORAL
  Filled 2017-03-06 (×2): qty 1

## 2017-03-06 MED ORDER — SERTRALINE HCL 50 MG PO TABS
150.0000 mg | ORAL_TABLET | Freq: Every day | ORAL | Status: DC
  Administered 2017-03-06 – 2017-03-08 (×3): 150 mg via ORAL
  Filled 2017-03-06 (×3): qty 3

## 2017-03-06 MED ORDER — HEPARIN SODIUM (PORCINE) 5000 UNIT/ML IJ SOLN
5000.0000 [IU] | Freq: Three times a day (TID) | INTRAMUSCULAR | Status: DC
  Administered 2017-03-06 – 2017-03-09 (×9): 5000 [IU] via SUBCUTANEOUS
  Filled 2017-03-06 (×10): qty 1

## 2017-03-06 MED ORDER — ATORVASTATIN CALCIUM 20 MG PO TABS
80.0000 mg | ORAL_TABLET | Freq: Every day | ORAL | Status: DC
  Administered 2017-03-06 – 2017-03-09 (×4): 80 mg via ORAL
  Filled 2017-03-06 (×4): qty 4

## 2017-03-06 MED ORDER — SODIUM CHLORIDE 0.9 % IV SOLN
30.0000 meq | Freq: Once | INTRAVENOUS | Status: AC
  Administered 2017-03-06: 30 meq via INTRAVENOUS
  Filled 2017-03-06: qty 15

## 2017-03-06 MED ORDER — ONDANSETRON HCL 4 MG/2ML IJ SOLN
4.0000 mg | Freq: Four times a day (QID) | INTRAMUSCULAR | Status: DC | PRN
  Administered 2017-03-06 – 2017-03-09 (×3): 4 mg via INTRAVENOUS
  Filled 2017-03-06 (×3): qty 2

## 2017-03-06 MED ORDER — LORAZEPAM 0.5 MG PO TABS
0.5000 mg | ORAL_TABLET | Freq: Every day | ORAL | Status: DC
  Administered 2017-03-06 – 2017-03-08 (×3): 0.5 mg via ORAL
  Filled 2017-03-06 (×3): qty 1

## 2017-03-06 MED ORDER — HYDROCODONE-ACETAMINOPHEN 10-325 MG PO TABS
1.0000 | ORAL_TABLET | Freq: Four times a day (QID) | ORAL | Status: DC | PRN
  Administered 2017-03-06 – 2017-03-09 (×7): 1 via ORAL
  Filled 2017-03-06 (×8): qty 1

## 2017-03-06 MED ORDER — SODIUM CHLORIDE 0.9 % IV SOLN
INTRAVENOUS | Status: DC
  Administered 2017-03-06: 05:00:00 via INTRAVENOUS

## 2017-03-06 MED ORDER — TRAZODONE HCL 50 MG PO TABS: 25.0000 mg | ORAL_TABLET | Freq: Every evening | ORAL | Status: DC | PRN

## 2017-03-06 MED ORDER — ORAL CARE MOUTH RINSE
15.0000 mL | Freq: Two times a day (BID) | OROMUCOSAL | Status: DC
  Administered 2017-03-06 – 2017-03-07 (×4): 15 mL via OROMUCOSAL

## 2017-03-06 MED ORDER — ACETAMINOPHEN 325 MG PO TABS
650.0000 mg | ORAL_TABLET | Freq: Four times a day (QID) | ORAL | Status: DC | PRN
  Administered 2017-03-06 – 2017-03-09 (×2): 650 mg via ORAL
  Filled 2017-03-06 (×2): qty 2

## 2017-03-06 MED ORDER — ONDANSETRON HCL 4 MG PO TABS
4.0000 mg | ORAL_TABLET | Freq: Four times a day (QID) | ORAL | Status: DC | PRN
  Administered 2017-03-09: 4 mg via ORAL

## 2017-03-06 MED ORDER — RISAQUAD PO CAPS
1.0000 | ORAL_CAPSULE | Freq: Two times a day (BID) | ORAL | Status: DC
  Administered 2017-03-06 – 2017-03-09 (×7): 1 via ORAL
  Filled 2017-03-06 (×7): qty 1

## 2017-03-06 MED ORDER — POTASSIUM CHLORIDE CRYS ER 20 MEQ PO TBCR
40.0000 meq | EXTENDED_RELEASE_TABLET | ORAL | Status: AC
  Administered 2017-03-06 (×2): 40 meq via ORAL
  Filled 2017-03-06 (×2): qty 2

## 2017-03-06 MED ORDER — CALCIUM CARBONATE ANTACID 500 MG PO CHEW
1.0000 | CHEWABLE_TABLET | Freq: Every day | ORAL | Status: DC
  Administered 2017-03-06 – 2017-03-09 (×4): 200 mg via ORAL
  Filled 2017-03-06 (×4): qty 1

## 2017-03-06 MED ORDER — ONDANSETRON HCL 4 MG PO TABS
8.0000 mg | ORAL_TABLET | Freq: Three times a day (TID) | ORAL | Status: DC | PRN
  Administered 2017-03-06 – 2017-03-09 (×3): 8 mg via ORAL
  Filled 2017-03-06 (×4): qty 2

## 2017-03-06 MED ORDER — NITROGLYCERIN 0.4 MG SL SUBL: 0.4000 mg | SUBLINGUAL_TABLET | SUBLINGUAL | Status: DC | PRN

## 2017-03-06 MED ORDER — DILTIAZEM HCL ER COATED BEADS 180 MG PO CP24
180.0000 mg | ORAL_CAPSULE | Freq: Every day | ORAL | Status: DC
  Administered 2017-03-06 – 2017-03-09 (×4): 180 mg via ORAL
  Filled 2017-03-06 (×4): qty 1

## 2017-03-06 MED ORDER — ACETAMINOPHEN 650 MG RE SUPP: 650.0000 mg | Freq: Four times a day (QID) | RECTAL | Status: DC | PRN

## 2017-03-06 MED ORDER — POTASSIUM CHLORIDE 2 MEQ/ML IV SOLN
30.0000 meq | Freq: Once | INTRAVENOUS | Status: AC
  Administered 2017-03-06: 30 meq via INTRAVENOUS
  Filled 2017-03-06: qty 15

## 2017-03-06 MED ORDER — VITAMIN D 1000 UNITS PO TABS
1000.0000 [IU] | ORAL_TABLET | Freq: Every day | ORAL | Status: DC
  Administered 2017-03-06 – 2017-03-09 (×4): 1000 [IU] via ORAL
  Filled 2017-03-06 (×4): qty 1

## 2017-03-06 NOTE — Progress Notes (Signed)
Lab called with a critical Potassium of 2.7. Dr called new order given, will continue to monitor.

## 2017-03-06 NOTE — Progress Notes (Signed)
CRITICAL VALUE ALERT  Critical value received:  2.5  Date of notification:  03/06/2017  Time of notification:  6:24pm  Critical value read back:Yes.    Nurse who received alert:  Deri Fuelling RN  MD notified (1st page):  Dr. Dede Query  Time of first page:  6:25pm  Responding MD: Dr. Dede Query  Time MD responded:  6:28 pm  MD placing orders now.  Deri Fuelling, RN

## 2017-03-06 NOTE — Progress Notes (Signed)
Confirmed patient has an active Hospital and Medical Secondary to [facility] Contract through [facility].  Secondary plan will not pay for any Part B related costs at SNFs.  Secondary plan covers Medicare cost share for days 21-100. No coverage for days 101+.  Rep mentioned there is a $300 deductible per admission as well.    Number called: [phone] Rep: Suezanne Jacquet Reference Number: [id number]

## 2017-03-06 NOTE — Progress Notes (Signed)
During assessment patient told RN that she was only having diarrhea because of the Milk of Magnesia that she was taking. MD notified and enteric precautions discontinued. Patient still complaining of nausea, MD aware and will order extra nausea medicine. Patient alert and oriented, at times disoriented to time and place.   Deri Fuelling, RN

## 2017-03-06 NOTE — NC FL2 (Signed)
[place] LEVEL OF CARE SCREENING TOOL     IDENTIFICATION  Patient Name: Michele Schultz Birthdate: Feb 08, 1931 Sex: female Admission Date (Current Location): 03/05/2017  [place] and [place] Number:  Engineering geologist and Address:  [facility], [address], [place], [place] [address]      Provider Number: [id number]  Attending Physician Name and Address:  Nicholes Mango, MD  Relative Name and Phone Number:       Current Level of Care: Hospital Recommended Level of Care: [place] Prior Approval Number:    Date Approved/Denied:   PASRR Number:  [id number])  Discharge Plan: Other (Comment) [facility])    Current Diagnoses: Patient Active Problem List   Diagnosis Date Noted  . Nausea vomiting and diarrhea 03/05/2017  . Adjustment disorder with depressed mood 11/02/2016  . Nausea & vomiting 11/01/2016  . Nausea and vomiting   . Atrial fibrillation with RVR ([place]) 10/11/2016  . Hypokalemia 10/11/2016  . DNR (do not resuscitate) 09/19/2016  . Palliative care by specialist 09/19/2016  . Muscle weakness (generalized)   . Atrial tachycardia ([place])   . Diarrhea 09/17/2016  . Dehydration 09/17/2016  . Metabolic acidosis 75/64/3329  . Headache due to trauma 07/03/2016  . Wide-complex tachycardia ([place])   . Pain in the chest   . Emesis   . Weakness of both legs   . Coronary artery disease involving coronary bypass graft of native heart with unspecified angina pectoris   . S/P AVR (aortic valve replacement)   . Falls frequently   . HLD (hyperlipidemia)   . Fibromyalgia   . RAD (reactive airway disease)   . Morbid obesity ([place])   . Aortic stenosis, severe   . Chronic pain 06/17/2014  . Pain in the muscles 06/19/2013  . Chest pain 06/19/2013  . Wheezing 06/19/2013  . Weakness generalized 06/03/2013  . Dyspnea 01/27/2013  . Nausea 03/05/2012  . Constipation 03/05/2012  . Atrial fibrillation-non sustained  02/09/2012  . Cardiac pacemaker -st Judes   . Chronic diastolic heart failure ([place])   . Coronary artery disease   . Long term (current) use of anticoagulants 01/03/2012  . S/P CABG x 2 11/14/2011  . S/P aortic valve replacement with porcine valve 11/14/2011  . Back pain 11/14/2011  . Complete heart block ([place]) 10/20/2011  . Hypertensive heart disease with CHF (congestive heart failure) ([place]) 09/15/2011  . Spinal stenosis 03/22/2011  . Fatigue 03/22/2011  . Hyperlipidemia 03/22/2011    Orientation RESPIRATION BLADDER Height & Weight     Self, Time, Situation, Place  Normal Incontinent Weight: 234 lb 1.6 oz (106.2 kg) Height:  5\' 4"  (162.6 cm)  BEHAVIORAL SYMPTOMS/MOOD NEUROLOGICAL BOWEL NUTRITION STATUS   (None)  (None. ) Incontinent Diet (Diet: NPO)  AMBULATORY STATUS COMMUNICATION OF NEEDS Skin   Extensive Assist Verbally Normal                       Personal Care Assistance Level of Assistance  Bathing, Feeding, Dressing Bathing Assistance: Limited assistance Feeding assistance: Independent Dressing Assistance: Limited assistance     Functional Limitations Info  Sight, Hearing, Speech Sight Info: Adequate Hearing Info: Adequate Speech Info: Adequate    SPECIAL CARE FACTORS FREQUENCY  PT (By licensed PT), OT (By licensed OT)                    Contractures      Additional Factors Info  Code Status, Allergies  Code Status Info:  (Full Code) Allergies Info:  (Citalopram, Cymbalta Duloxetine Hcl, Imipramine, Proton Pump Inhibitors, Venlafaxine)           Current Medications (03/06/2017):  This is the current hospital active medication list Current Facility-Administered Medications  Medication Dose Route Frequency Provider Last Rate Last Dose  . 0.9 %  sodium chloride infusion   Intravenous Continuous Max Sane, MD 100 mL/hr at 03/06/17 0446    . acetaminophen (TYLENOL) tablet 650 mg  650 mg Oral Q6H PRN Max Sane, MD       Or  . acetaminophen  (TYLENOL) suppository 650 mg  650 mg Rectal Q6H PRN Max Sane, MD      . acidophilus (RISAQUAD) capsule 1 capsule  1 capsule Oral BID Max Sane, MD   1 capsule at 03/06/17 0904  . aspirin EC tablet 81 mg  81 mg Oral Daily Max Sane, MD   81 mg at 03/06/17 0905  . atorvastatin (LIPITOR) tablet 80 mg  80 mg Oral Daily Max Sane, MD   80 mg at 03/06/17 0904  . calcium carbonate (TUMS - dosed in mg elemental calcium) chewable tablet 200 mg of elemental calcium  1 tablet Oral Daily Vipul Shah, MD   200 mg of elemental calcium at 03/06/17 0904  . cholecalciferol (VITAMIN D) tablet 1,000 Units  1,000 Units Oral Daily Max Sane, MD   1,000 Units at 03/06/17 0904  . diltiazem (CARDIZEM CD) 24 hr capsule 180 mg  180 mg Oral Daily Vipul Shah, MD   180 mg at 03/06/17 0904  . heparin injection 5,000 Units  5,000 Units Subcutaneous Q8H Max Sane, MD   5,000 Units at 03/06/17 0445  . LORazepam (ATIVAN) tablet 0.5 mg  0.5 mg Oral QHS Vipul Shah, MD      . nitroGLYCERIN (NITROSTAT) SL tablet 0.4 mg  0.4 mg Sublingual Q5 min PRN Max Sane, MD      . ondansetron (ZOFRAN) tablet 4 mg  4 mg Oral Q6H PRN Max Sane, MD       Or  . ondansetron (ZOFRAN) injection 4 mg  4 mg Intravenous Q6H PRN Max Sane, MD      . ondansetron (ZOFRAN) tablet 8 mg  8 mg Oral Q8H PRN Max Sane, MD   8 mg at 03/06/17 0905  . sertraline (ZOLOFT) tablet 150 mg  150 mg Oral QHS Vipul Shah, MD      . traZODone (DESYREL) tablet 25 mg  25 mg Oral QHS PRN Max Sane, MD         Discharge Medications: Please see discharge summary for a list of discharge medications.  Relevant Imaging Results:  Relevant Lab Results:   Additional Information  (SSN: [id number])  Danie Chandler, Student-Social Work

## 2017-03-06 NOTE — Clinical Social Work Note (Addendum)
Clinical Social Work Assessment  Patient Details  Name: Michele Schultz MRN: [id number] Date of Birth: Feb 04, 1931  Date of referral:  03/06/17               Reason for consult:  Facility Placement, Discharge Planning                Permission sought to share information with:  Chartered certified accountant granted to share information::  Yes, Verbal Permission Granted  Name::      [facility]  Agency::   [facility]   Relationship::     Contact Information:     Housing/Transportation Living arrangements for the past 2 months:  [place] ([facility]) Source of Information:  Patient, Friend/Neighbor Patient Interpreter Needed:  None Criminal Activity/Legal Involvement Pertinent to Current Situation/Hospitalization:  No - Comment as needed Significant Relationships:  Friend, Other Family Members Lives with:  Facility Resident ([facility]) Do you feel safe going back to the place where you live?  Yes Need for family participation in patient care:  Yes (Comment)  Care giving concerns: Patient is a long term care resident at [facility] fax: [phone].    Social Worker assessment / plan: Social work Theatre manager noted in chart that patient is from [facility]. Social work Theatre manager met with patient and patient's friend Michele Schultz at bedside. Patient was laying in bed and was alert and oriented however appeared confused at times. Social work Theatre manager introduced self and explained role of social work department. Per patient's friend, patient has been at [facility] since Saturday. Before patient came to [facility], patient was at [facility] for rehab. Patient stated she has also been at [facility] in the past for rehab. Patient's HPOA is niece Michele Schultz. Patient stated that she uses a wheelchair to get around the facility and is not on any oxygen. Patient is agreeable to go back to [facility] when medically stable for discharge.  Social work Theatre manager will continue to assist and follow as needed.   Clinical Education officer, museum (CSW) contacted Michele Schultz and spoke to [employer]. Per med tech patient is a 2-3 assist and primarily is in the wheel chair. Per med tech patient came to [facility] from [employer]. Per med tech patient needs help with transfers however she can do things herself when she feels like it. Per med tech patient can return to [facility] via EMS. FL2 completed. CSW will continue to follow and assist as needed.    Employment status:  Unemployed Forensic scientist:  Medicare PT Recommendations:  Not assessed at this time Information / Referral to community resources:  Other (Comment Required) ([facility])  Patient/Family's Response to care:  Patient verbally agreed to go back to [facility].   Patient/Family's Understanding of and Emotional Response to Diagnosis, Current Treatment, and Prognosis:  Patient and patient's friend was pleasant and thanked social work Theatre manager for visiting.   Emotional Assessment Appearance:  Appears stated age Attitude/Demeanor/Rapport:    Affect (typically observed):  Accepting, Adaptable, Appropriate Orientation:  Oriented to Self, Oriented to Place, Oriented to  Time, Oriented to Situation Alcohol / Substance use:  Not Applicable Psych involvement (Current and /or in the community):  No (Comment)  Discharge Needs  Concerns to be addressed:  Basic Needs Readmission within the last 30 days:  No Current discharge risk:  Dependent with Mobility, Chronically ill Barriers to Discharge:  Continued Medical Work up   [employer], [place] Work 03/06/2017, 9:58 AM

## 2017-03-06 NOTE — Progress Notes (Signed)
Checked SNFs days available through [facility] automated system [phone]).  As of last billing date of 12/03/16, patient had 0 full SNF days and 16 co-SNF days available.  No recent information available through Medicare.

## 2017-03-06 NOTE — Plan of Care (Signed)
Problem: Physical Regulation: Goal: Ability to maintain clinical measurements within normal limits will improve Outcome: Progressing Patient not complaining of treatment. Patient getting more confused as the day progresses will continue to monitor.   Michele Schultz

## 2017-03-06 NOTE — Progress Notes (Signed)
[place] at [place] NAME: Michele Schultz    MR#:  [id number]  DATE OF BIRTH:  05/08/1931  SUBJECTIVE:  CHIEF COMPLAINT:  Patient is reporting nausea. Denies any diarrhea or abdominal pain  REVIEW OF SYSTEMS:  CONSTITUTIONAL: No fever, fatigue or weakness.  EYES: No blurred or double vision.  EARS, NOSE, AND THROAT: No tinnitus or ear pain.  RESPIRATORY: No cough, shortness of breath, wheezing or hemoptysis.  CARDIOVASCULAR: No chest pain, orthopnea, edema.  GASTROINTESTINAL:  Patient reporting nausea, denies vomiting, diarrhea or abdominal pain.  GENITOURINARY: No dysuria, hematuria.  ENDOCRINE: No polyuria, nocturia,  HEMATOLOGY: No anemia, easy bruising or bleeding SKIN: No rash or lesion. MUSCULOSKELETAL: No joint pain or arthritis.   NEUROLOGIC: No tingling, numbness, weakness.  PSYCHIATRY: No anxiety or depression.   DRUG ALLERGIES:   Allergies  Allergen Reactions  . Citalopram Other (See Comments)    Reaction:  Altered mental status   . Cymbalta [Duloxetine Hcl] Other (See Comments)    Reaction:  Sedative for pt   . Imipramine Other (See Comments)    Reaction:  Unknown   . Proton Pump Inhibitors Other (See Comments)    Reaction:  Unknown   . Venlafaxine Nausea And Vomiting and Other (See Comments)    Reaction:  Dizziness     VITALS:  Blood pressure 122/66, pulse 89, temperature 97.9 F (36.6 C), temperature source Oral, resp. rate 18, height 5\' 4"  (1.626 m), weight 106.2 kg (234 lb 1.6 oz), SpO2 97 %.  PHYSICAL EXAMINATION:  GENERAL:  83 y.o.-year-old patient lying in the bed with no acute distress.  EYES: Pupils equal, round, reactive to light and accommodation. No scleral icterus. Extraocular muscles intact.  HEENT: Head atraumatic, normocephalic. Oropharynx and nasopharynx clear.  NECK:  Supple, no jugular venous distention. No thyroid enlargement, no tenderness.  LUNGS: Normal breath sounds bilaterally, no  wheezing, rales,rhonchi or crepitation. No use of accessory muscles of respiration.  CARDIOVASCULAR: S1, S2 normal. No murmurs, rubs, or gallops.  ABDOMEN: Soft, nontender, nondistended. Bowel sounds present. No organomegaly or mass.  EXTREMITIES: No pedal edema, cyanosis, or clubbing.  NEUROLOGIC: Cranial nerves II through XII are intact. Muscle strength 5/5 in all extremities. Sensation intact. Gait not checked.  PSYCHIATRIC: The patient is alert and oriented x 3.  SKIN: No obvious rash, lesion, or ulcer.    LABORATORY PANEL:   CBC  Recent Labs Lab 03/06/17 0436  WBC 6.3  HGB 11.5*  HCT 35.4  PLT 245   ------------------------------------------------------------------------------------------------------------------  Chemistries   Recent Labs Lab 03/05/17 1526 03/06/17 0436 03/06/17 1233  NA 139 142  --   K 3.0* 2.7*  --   CL 102 108  --   CO2 26 27  --   GLUCOSE 116* 116*  --   BUN 8 6  --   CREATININE 0.72 0.52  --   CALCIUM 8.9 8.1*  --   MG  --   --  1.7  AST 221*  --   --   ALT 59*  --   --   ALKPHOS 68  --   --   BILITOT 0.8  --   --    ------------------------------------------------------------------------------------------------------------------  Cardiac Enzymes  Recent Labs Lab 03/05/17 1826  TROPONINI 0.03*   ------------------------------------------------------------------------------------------------------------------  RADIOLOGY:  No results found.  EKG:   Orders placed or performed during the hospital encounter of 03/05/17  . ED EKG  . ED EKG  . EKG 12-Lead  .  EKG 12-Lead  . EKG 12-Lead  . EKG 12-Lead    ASSESSMENT AND PLAN:   83 year old female with known history of Severe aortic stenosis, chronic diastolic heart failure, morbid obesity, proximal atrial fibrillation being admitted for intractable nausea, vomiting, diarrhea  * Intractable nausea, vomiting, diarrhea likely secondary to viral gastroenteritis:  -Denies any  vomiting today. No diarrhea - Continue IV hydration. Start clear liquids    * severe hypokalemia secondary to vomiting, GI losses:  - Replete and recheck - Check magnesium-1.7  *severe aortic stenosis, chronic diastolic heart failure:  Patient on Lasix, hold the Lasix at this time because of her nausea, vomiting, dehydration.  * Paroxymal atrial fibrillation, history of pacemaker placement: Patient is on Cardizem at home.   All the records are reviewed and case discussed with ED provider.     All the records are reviewed and case discussed with Care Management/Social Workerr. Management plans discussed with the patient, family and they are in agreement.  CODE STATUS:   TOTAL TIME TAKING CARE OF THIS PATIENT: 35 minutes.   POSSIBLE D/C IN 1-2DAYS, DEPENDING ON CLINICAL CONDITION.  Note: This dictation was prepared with Dragon dictation along with smaller phrase technology. Any transcriptional errors that result from this process are unintentional.   Nicholes Mango M.D on 03/06/2017 at 5:09 PM  Between 7am to 6pm - Pager - [phone] After 6pm go to [email or web address] - password [email or web address] [place] [facility]  Office  [phone]  CC: Primary care physician; [name], MD

## 2017-03-07 LAB — BASIC METABOLIC PANEL
Anion gap: 7 (ref 5–15)
BUN: <5 mg/dL — ABNORMAL LOW (ref 6–20)
CHLORIDE: 112 mmol/L — AB (ref 101–111)
CO2: 24 mmol/L (ref 22–32)
Calcium: 8.3 mg/dL — ABNORMAL LOW (ref 8.9–10.3)
Glucose, Bld: 117 mg/dL — ABNORMAL HIGH (ref 65–99)
POTASSIUM: 3.6 mmol/L (ref 3.5–5.1)
SODIUM: 143 mmol/L (ref 135–145)

## 2017-03-07 LAB — CBC
HCT: 35.8 % (ref 35.0–47.0)
HEMOGLOBIN: 11.7 g/dL — AB (ref 12.0–16.0)
MCH: 27.3 pg (ref 26.0–34.0)
MCHC: 32.7 g/dL (ref 32.0–36.0)
MCV: 83.3 fL (ref 80.0–100.0)
Platelets: 237 10*3/uL (ref 150–440)
RBC: 4.3 MIL/uL (ref 3.80–5.20)
RDW: 18.2 % — ABNORMAL HIGH (ref 11.5–14.5)
WBC: 5.1 10*3/uL (ref 3.6–11.0)

## 2017-03-07 LAB — GLUCOSE, CAPILLARY: GLUCOSE-CAPILLARY: 123 mg/dL — AB (ref 65–99)

## 2017-03-07 MED ORDER — FAMOTIDINE IN NACL 20-0.9 MG/50ML-% IV SOLN
20.0000 mg | Freq: Two times a day (BID) | INTRAVENOUS | Status: DC
  Administered 2017-03-07 – 2017-03-09 (×4): 20 mg via INTRAVENOUS
  Filled 2017-03-07 (×6): qty 50

## 2017-03-07 NOTE — Progress Notes (Signed)
PT Cancellation Note  Patient Details Name: Michele Schultz MRN: [id number] DOB: 14-Oct-1931   Cancelled Treatment:    Reason Eval/Treat Not Completed: Other (comment) (Pt states she is too sick, but no vomiting per nsg).  Will try again in AM, was asking for pain meds which nsg has just given, but is refusing.   Ramond Dial 03/07/2017, 2:38 PM   Mee Hives, PT MS Acute Rehab Dept. Number: [place] and [place]

## 2017-03-07 NOTE — Progress Notes (Signed)
[place] at [place] NAME: Michele Schultz    MR#:  [id number]  DATE OF BIRTH:  01-28-1931  SUBJECTIVE:  CHIEF COMPLAINT:  Patient is still  reporting nauseaAnd epigastric abdominal pain  REVIEW OF SYSTEMS:  CONSTITUTIONAL: No fever, fatigue or weakness.  EYES: No blurred or double vision.  EARS, NOSE, AND THROAT: No tinnitus or ear pain.  RESPIRATORY: No cough, shortness of breath, wheezing or hemoptysis.  CARDIOVASCULAR: No chest pain, orthopnea, edema.  GASTROINTESTINAL:  Patient reporting nausea, denies vomiting, diarrhea or abdominal pain.  GENITOURINARY: No dysuria, hematuria.  ENDOCRINE: No polyuria, nocturia,  HEMATOLOGY: No anemia, easy bruising or bleeding SKIN: No rash or lesion. MUSCULOSKELETAL: No joint pain or arthritis.   NEUROLOGIC: No tingling, numbness, weakness.  PSYCHIATRY: No anxiety or depression.   DRUG ALLERGIES:   Allergies  Allergen Reactions  . Citalopram Other (See Comments)    Reaction:  Altered mental status   . Cymbalta [Duloxetine Hcl] Other (See Comments)    Reaction:  Sedative for pt   . Imipramine Other (See Comments)    Reaction:  Unknown   . Proton Pump Inhibitors Other (See Comments)    Reaction:  Unknown   . Venlafaxine Nausea And Vomiting and Other (See Comments)    Reaction:  Dizziness     VITALS:  Blood pressure (!) 148/77, pulse (!) 107, temperature 97.8 F (36.6 C), temperature source Oral, resp. rate 18, height 5\' 4"  (1.626 m), weight 109.2 kg (240 lb 11.2 oz), SpO2 97 %.  PHYSICAL EXAMINATION:  GENERAL:  83 y.o.-year-old patient lying in the bed with no acute distress.  EYES: Pupils equal, round, reactive to light and accommodation. No scleral icterus. Extraocular muscles intact.  HEENT: Head atraumatic, normocephalic. Oropharynx and nasopharynx clear.  NECK:  Supple, no jugular venous distention. No thyroid enlargement, no tenderness.  LUNGS: Normal breath sounds  bilaterally, no wheezing, rales,rhonchi or crepitation. No use of accessory muscles of respiration.  CARDIOVASCULAR: S1, S2 normal. No murmurs, rubs, or gallops.  ABDOMEN: Soft, tender in the epigastric area without rebound tenderness nondistended. Bowel sounds present. No organomegaly or mass.  EXTREMITIES: No pedal edema, cyanosis, or clubbing.  NEUROLOGIC: Cranial nerves II through XII are intact. Muscle strength 5/5 in all extremities. Sensation intact. Gait not checked.  PSYCHIATRIC: The patient is alert and oriented x 3.  SKIN: No obvious rash, lesion, or ulcer.    LABORATORY PANEL:   CBC  Recent Labs Lab 03/07/17 0532  WBC 5.1  HGB 11.7*  HCT 35.8  PLT 237   ------------------------------------------------------------------------------------------------------------------  Chemistries   Recent Labs Lab 03/06/17 1233 03/06/17 1727 03/07/17 0532  NA  --  136 143  K  --  2.5* 3.6  CL  --  104 112*  CO2  --  22 24  GLUCOSE  --  124* 117*  BUN  --  <5* <5*  CREATININE  --  0.64 <0.30*  CALCIUM  --  8.2* 8.3*  MG 1.7  --   --   AST  --  188*  --   ALT  --  52  --   ALKPHOS  --  57  --   BILITOT  --  0.8  --    ------------------------------------------------------------------------------------------------------------------  Cardiac Enzymes  Recent Labs Lab 03/05/17 1826  TROPONINI 0.03*   ------------------------------------------------------------------------------------------------------------------  RADIOLOGY:  No results found.  EKG:   Orders placed or performed during the hospital encounter of 03/05/17  . ED EKG  .  ED EKG  . EKG 12-Lead  . EKG 12-Lead  . EKG 12-Lead  . EKG 12-Lead    ASSESSMENT AND PLAN:   83 year old female with known history of Severe aortic stenosis, chronic diastolic heart failure, morbid obesity, proximal atrial fibrillation being admitted for intractable nausea, vomiting, diarrhea  * Intractable nausea, vomiting,  diarrhea likely secondary to viral gastroenteritis:  -Denies any vomiting today. No diarrhea,  -Still nauseous with epigastric tenderness -Check stool for H. pylori, Pepcid IV as patient is allergic to PPI - Continue IV hydration.  clear liquids  , nothing by mouth after midnight -Discussed with gastroenterology, if patient is not feeling better call GI for possible EGD in a.m.  * severe hypokalemia secondary to vomiting, GI losses:  - Replete and recheck, potassium 3.6 - Check magnesium-1.7  *severe aortic stenosis, chronic diastolic heart failure:  Patient on Lasix, hold the Lasix at this time because of her nausea, vomiting, dehydration.  * Paroxymal atrial fibrillation, history of pacemaker placement: Patient is on Cardizem at home.   All the records are reviewed and case discussed with ED provider.     All the records are reviewed and case discussed with Care Management/Social Workerr. Management plans discussed with the patient, family and they are in agreement.  CODE STATUS:   TOTAL TIME TAKING CARE OF THIS PATIENT: 35 minutes.   POSSIBLE D/C IN 1-2DAYS, DEPENDING ON CLINICAL CONDITION.  Note: This dictation was prepared with Dragon dictation along with smaller phrase technology. Any transcriptional errors that result from this process are unintentional.   Nicholes Mango M.D on 03/07/2017 at 4:30 PM  Between 7am to 6pm - Pager - [phone] After 6pm go to [email or web address] - password [email or web address] [place] [facility]  Office  [phone]  CC: Primary care physician; [name], MD

## 2017-03-08 ENCOUNTER — Inpatient Hospital Stay: Payer: Medicare Other

## 2017-03-08 DIAGNOSIS — R112 Nausea with vomiting, unspecified: Secondary | ICD-10-CM

## 2017-03-08 LAB — BASIC METABOLIC PANEL
ANION GAP: 10 (ref 5–15)
BUN: 6 mg/dL (ref 6–20)
CALCIUM: 8.5 mg/dL — AB (ref 8.9–10.3)
CO2: 26 mmol/L (ref 22–32)
CREATININE: 0.53 mg/dL (ref 0.44–1.00)
Chloride: 107 mmol/L (ref 101–111)
Glucose, Bld: 120 mg/dL — ABNORMAL HIGH (ref 65–99)
Potassium: 3.1 mmol/L — ABNORMAL LOW (ref 3.5–5.1)
SODIUM: 143 mmol/L (ref 135–145)

## 2017-03-08 LAB — CBC WITH DIFFERENTIAL/PLATELET
BASOS ABS: 0 10*3/uL (ref 0–0.1)
BASOS PCT: 1 %
EOS ABS: 0.2 10*3/uL (ref 0–0.7)
Eosinophils Relative: 2 %
HEMATOCRIT: 37.4 % (ref 35.0–47.0)
HEMOGLOBIN: 12.2 g/dL (ref 12.0–16.0)
Lymphocytes Relative: 22 %
Lymphs Abs: 1.4 10*3/uL (ref 1.0–3.6)
MCH: 26.6 pg (ref 26.0–34.0)
MCHC: 32.5 g/dL (ref 32.0–36.0)
MCV: 81.7 fL (ref 80.0–100.0)
MONO ABS: 0.7 10*3/uL (ref 0.2–0.9)
Monocytes Relative: 11 %
NEUTROS PCT: 64 %
Neutro Abs: 4.1 10*3/uL (ref 1.4–6.5)
Platelets: 269 10*3/uL (ref 150–440)
RBC: 4.58 MIL/uL (ref 3.80–5.20)
RDW: 18.6 % — AB (ref 11.5–14.5)
WBC: 6.4 10*3/uL (ref 3.6–11.0)

## 2017-03-08 LAB — GLUCOSE, CAPILLARY: GLUCOSE-CAPILLARY: 113 mg/dL — AB (ref 65–99)

## 2017-03-08 LAB — LIPASE, BLOOD: LIPASE: 16 U/L (ref 11–51)

## 2017-03-08 MED ORDER — SODIUM CHLORIDE 0.9 % IV SOLN
30.0000 meq | Freq: Once | INTRAVENOUS | Status: AC
  Administered 2017-03-08: 30 meq via INTRAVENOUS
  Filled 2017-03-08: qty 15

## 2017-03-08 MED ORDER — MAGNESIUM SULFATE 2 GM/50ML IV SOLN
2.0000 g | Freq: Once | INTRAVENOUS | Status: AC
  Administered 2017-03-08: 2 g via INTRAVENOUS
  Filled 2017-03-08: qty 50

## 2017-03-08 NOTE — Progress Notes (Signed)
Per MD patient is not stable for D/C today. Clinical Education officer, museum (CSW) contacted [employer] and made her aware of above. Per Lattie Haw she came to visit patient in the hospital and stated that she can return when stable. CSW will continue to follow and assist as needed.   [employer], LCSW [phone]

## 2017-03-08 NOTE — Progress Notes (Signed)
Patient ID: Michele Schultz, female   DOB: Apr 26, 1931, 83 y.o.   MRN: [id number]  [employer] PROGRESS NOTE  Michele Schultz XHB:[id number] DOB: 1931/08/04 DOA: 03/05/2017 PCP: [name], MD  HPI/Subjective: Patient still not feeling well. Still very nauseous. Had diarrhea prior to coming into the hospital. Some abdominal pain. Patient states that she is not even able to tolerate water. Not vomiting though.  Objective: Vitals:   03/06/17 2339 03/07/17 0729  BP: (!) 120/57 (!) 148/77  Pulse: 75 (!) 107  Resp: 20 18  Temp: 98.9 F (37.2 C) 97.8 F (36.6 C)    Filed Weights   03/06/17 0053 03/06/17 0455 03/07/17 0655  Weight: 106.2 kg (234 lb 1.6 oz) 106.2 kg (234 lb 1.6 oz) 109.2 kg (240 lb 11.2 oz)    ROS: Review of Systems  Constitutional: Negative for chills and fever.  Eyes: Negative for blurred vision.  Respiratory: Positive for shortness of breath. Negative for cough.   Cardiovascular: Negative for chest pain.  Gastrointestinal: Positive for abdominal pain and nausea. Negative for constipation, diarrhea and vomiting.  Genitourinary: Negative for dysuria.  Musculoskeletal: Negative for joint pain.  Neurological: Negative for dizziness and headaches.   Exam: Physical Exam  Constitutional: She is oriented to person, place, and time.  HENT:  Nose: No mucosal edema.  Mouth/Throat: No oropharyngeal exudate or posterior oropharyngeal edema.  Eyes: Conjunctivae, EOM and lids are normal. Pupils are equal, round, and reactive to light.  Neck: No JVD present. Carotid bruit is not present. No edema present. No thyroid mass and no thyromegaly present.  Cardiovascular: S1 normal and S2 normal.  An irregularly irregular rhythm present. Tachycardia present.  Exam reveals no gallop.   Murmur heard.  Systolic murmur is present with a grade of 2/6  Pulses:      Dorsalis pedis pulses are 2+ on the right side, and 2+ on the left side.  Respiratory: No respiratory  distress. She has no wheezes. She has no rhonchi. She has no rales.  GI: Soft. Bowel sounds are normal. There is no tenderness.  Musculoskeletal:       Right ankle: She exhibits swelling.       Left ankle: She exhibits swelling.  Lymphadenopathy:    She has no cervical adenopathy.  Neurological: She is alert and oriented to person, place, and time. No cranial nerve deficit.  Skin: Skin is warm. No rash noted. Nails show no clubbing.  Psychiatric: She has a normal mood and affect.      Data Reviewed: Basic Metabolic Panel:  Recent Labs Lab 03/05/17 1526 03/06/17 0436 03/06/17 1233 03/06/17 1727 03/07/17 0532 03/08/17 0520  NA 139 142  --  136 143 143  K 3.0* 2.7*  --  2.5* 3.6 3.1*  CL 102 108  --  104 112* 107  CO2 26 27  --  22 24 26   GLUCOSE 116* 116*  --  124* 117* 120*  BUN 8 6  --  <5* <5* 6  CREATININE 0.72 0.52  --  0.64 <0.30* 0.53  CALCIUM 8.9 8.1*  --  8.2* 8.3* 8.5*  MG  --   --  1.7  --   --   --    Liver Function Tests:  Recent Labs Lab 03/05/17 1526 03/06/17 1727  AST 221* 188*  ALT 59* 52  ALKPHOS 68 57  BILITOT 0.8 0.8  PROT 7.6 6.5  ALBUMIN 3.9 3.2*    Recent Labs Lab 03/05/17 1526 03/08/17 0520  LIPASE 24 16   CBC:  Recent Labs Lab 03/05/17 1526 03/06/17 0436 03/07/17 0532 03/08/17 0520  WBC 8.7 6.3 5.1 6.4  NEUTROABS 6.5  --   --  4.1  HGB 12.3 11.5* 11.7* 12.2  HCT 38.4 35.4 35.8 37.4  MCV 82.0 83.2 83.3 81.7  PLT 283 245 237 269   Cardiac Enzymes:  Recent Labs Lab 03/05/17 1526 03/05/17 1826  TROPONINI 0.03* 0.03*    CBG:  Recent Labs Lab 03/06/17 0748 03/07/17 0823 03/08/17 0747  GLUCAP 113* 123* 113*    Recent Results (from the past 240 hour(s))  MRSA PCR Screening     Status: None   Collection Time: 03/06/17 12:51 AM  Result Value Ref Range Status   MRSA by PCR NEGATIVE NEGATIVE Final    Comment:        The GeneXpert MRSA Assay (FDA approved for NASAL specimens only), is one component of  a comprehensive MRSA colonization surveillance program. It is not intended to diagnose MRSA infection nor to guide or monitor treatment for MRSA infections.       Scheduled Meds: . acidophilus  1 capsule Oral BID  . atorvastatin  80 mg Oral Daily  . calcium carbonate  1 tablet Oral Daily  . cholecalciferol  1,000 Units Oral Daily  . diltiazem  180 mg Oral Daily  . famotidine (PEPCID) IV  20 mg Intravenous Q12H  . heparin  5,000 Units Subcutaneous Q8H  . LORazepam  0.5 mg Oral QHS  . mouth rinse  15 mL Mouth Rinse BID  . potassium chloride (KCL MULTIRUN) 30 mEq in 265 mL IVPB  30 mEq Intravenous Once  . sertraline  150 mg Oral QHS   Continuous Infusions: . sodium chloride 50 mL/hr at 03/06/17 1852    Assessment/Plan:  1. Patient came in with nausea vomiting and diarrhea. Patient with persistent nausea. No further diarrhea. No actual vomiting. Trying to page gastroenterology for endoscopy. Can potentially get an ultrasound of the gallbladder and abdominal x-ray also. On Pepcid IV. 2. Hypokalemia and hypomagnesemia replace potassium and magnesium today 3. Atrial fibrillation with rapid rate. Please give this morning's Cardizem 4. Severe aortic stenosis and history of diastolic congestive heart failure. Careful with IV fluids 5. Hyperlipidemia unspecified on atorvastatin 6. Depression on Zoloft  Code Status:     Code Status Orders        Start     Ordered   03/06/17 0033  Full code  Continuous     03/06/17 0032    Code Status History    Date Active Date Inactive Code Status Order ID Comments User Context   03/05/2017 10:41 PM 03/06/2017 12:32 AM DNR [id number]  Max Sane, MD ED   11/01/2016  8:09 AM 11/06/2016  4:51 PM DNR [id number]  Epifanio Lesches, MD ED   10/12/2016  1:36 AM 10/21/2016  6:48 PM DNR [id number]  Lance Coon, MD Inpatient   09/19/2016 10:52 AM 09/22/2016  8:50 PM DNR [id number]  Knox Royalty, NP Inpatient   09/17/2016  5:57 PM 09/19/2016 10:52 AM Full  Code [id number]  Theodoro Grist, MD Inpatient   06/23/2016  8:20 PM 06/26/2016  1:26 PM Full Code [id number]  Gladstone Lighter, MD Inpatient   06/16/2016  8:35 PM 06/21/2016 10:09 PM Full Code [id number]  Lytle Butte, MD ED   03/13/2016  8:15 PM 03/14/2016  9:02 PM Full Code [id number]  Henreitta Leber, MD Inpatient   09/13/2015  6:51 PM 09/18/2015  7:31 PM  Full Code [id number]  Dustin Flock, MD Inpatient   10/20/2011  1:40 PM 10/22/2011  1:27 PM Full Code [id number]  Tiajuana Amass, RN Inpatient    Advance Directive Documentation     Most Recent Value  Type of Advance Directive  Healthcare Power of Attorney  Pre-existing out of facility DNR order (yellow form or pink MOST form)  -  "MOST" Form in Place?  -     Family Communication: Permission to speak in front of friend at the bedside Disposition Plan: Will need to tolerate solid food prior to disposition  Time spent: 28 minutes  Leslye Peer, [employer]

## 2017-03-08 NOTE — Progress Notes (Signed)
PT Cancellation Note  Patient Details Name: Michele Schultz MRN: [id number] DOB: [birth year]   Cancelled Treatment:    Reason Eval/Treat Not Completed: Patient at procedure or test/unavailable;Other (comment) (one refusal after feeling sick after Egd).  Try later as time and pt allow.   Ramond Dial 03/08/2017, 12:18 PM   Mee Hives, PT MS Acute Rehab Dept. Number: [place] and [place]

## 2017-03-08 NOTE — Consult Note (Signed)
[facility] Consultation Note  Patient ID: Michele Schultz, MRN: [id number], DOB/AGE: 08/19/1931 83 y.o. Admit date: 03/05/2017   Date of Consult: 03/08/2017 Primary Physician: [name], MD Primary Cardiologist: None  Chief Complaint:  Chief Complaint  Patient presents with  . Emesis   Reason for Consult: nausea vomiting and other abdominal discomfort with known cardiovascular disease  HPI: 83 y.o. female with known cardiovascular disease with severe aortic valve stenosis status post pericardial valve replacement and coronary artery bypass graft with a LIMA to the LAD and saphenous vein graft to obtuse marginal 1 in 2012 and history of the left bundle-branch block status post pacemaker placement for complete heart block. The patient has had some diastolic dysfunction heart failure in the past with the normal LV systolic function but now comes in to the hospital with significant nausea vomiting and rapid ventricular rate with pacing possibly consistent with atrial fibrillation although current EKG does not show any atrial pacing and/or current atrial fibrillation. The patient has had improvements of symptoms since she has been here and currently has not had any significant signs or symptoms of congestive heart failure and or myocardial infarction. Her troponin level is normal.  Past Medical History:  Diagnosis Date  . Anemia   . Aortic stenosis, severe    a. s/p Magna Ease pericardial tissue valve size 21 mm replacement in 10/2011 for severe AS 11/12; b. echo 07/2015: EF 55-60% mod concentric LVH, GR1DD, LA mildly dilated, PASP 45 mm Hg  . Atrial tachycardia, paroxysmal (HCC)    a. with rate related LBBB.  . Cardiac pacemaker -st Judes    11/12  . Chronic diastolic heart failure ([place])    a. echo 2014: EF 55-60%, no RWMA, GR1DD, PASP 47 mm Hg; b. echo 07/2015: EF 55-60% mod concentric LVH, GR1DD, LA mildly dilated, PASP 45 mm Hg  . Complete heart block [facility])    a.  s/p St Jude PPM 10/2011 (Ser # [id number]).  . Coronary artery disease    a. s/p 2v CABG 11/12 (VG-LAD, VG-OM1); b. Lexiscan 08/2015: low risk, no ischemia, EF 55-65%.  . Enthesopathy of hip region   . Esophageal reflux    followed by Dr.Seigal. stabilized with a combination of Nexium and Zantac  . Fibromyalgia   . HLD (hyperlipidemia)   . Hypertensive heart disease   . Knee joint replacement by other means   . Morbid obesity ([place])   . Neuralgia, neuritis, and radiculitis, unspecified   . Neuropathy ([place])   . Onychia and paronychia of toe   . Osteoarthrosis, unspecified whether generalized or localized, pelvic region and thigh    mainly in her back and knees  . PAF (paroxysmal atrial fibrillation) ([place])    a. brief episodes of AF previously noted on device interrogations.  [facility] RAD (reactive airway disease)    a. chronic SOB  . Spinal stenosis   . Stress incontinence, female    followed by Dr.Cope  . Type II or unspecified type diabetes mellitus without mention of complication, not stated as uncontrolled    a. pt. reports that she is borderline   . Wide-complex tachycardia ([place])    a. Noted 4/10 - brief episode in ED. Noted again 4/21 in clinic->felt most likely to be atrial tach.      Surgical History:  Past Surgical History:  Procedure Laterality Date  . AORTIC VALVE REPLACEMENT  10/17/2011   Procedure: AORTIC VALVE REPLACEMENT (AVR);  Surgeon: Melrose Nakayama, MD;  Location:  [place] OR;  Service: Open Heart Surgery;  Laterality: N/A;  . CARDIAC CATHETERIZATION  08/2009   50% stenosis distal left main, 50% stenosis ostial left circumflex.   Marland Kitchen CARDIAC CATHETERIZATION     at [facility]  . CORONARY ARTERY BYPASS GRAFT  10/17/2011   Procedure: CORONARY ARTERY BYPASS GRAFTING (CABG);  Surgeon: Melrose Nakayama, MD;  Location: [place];  Service: Open Heart Surgery;  Laterality: N/A;  Times Two, using right leg greater saphenous vein harvested endoscopically  . ESOPHAGOGASTRODUODENOSCOPY (EGD)  WITH PROPOFOL N/A 10/12/2016   Procedure: ESOPHAGOGASTRODUODENOSCOPY (EGD) WITH PROPOFOL;  Surgeon: Lucilla Lame, MD;  Location: [facility] ENDOSCOPY;  Service: Endoscopy;  Laterality: N/A;  . EYE SURGERY     IOL/ after cataracts removed- bilateral   . NASAL SINUS SURGERY  2009  . PERMANENT PACEMAKER INSERTION N/A 10/20/2011   Procedure: PERMANENT PACEMAKER INSERTION;  Surgeon: Thompson Grayer, MD;  Location: [facility] CATH LAB;  Service: Cardiovascular;  Laterality: N/A;  . REPLACEMENT TOTAL KNEE  11/2006   right knee  . TOTAL HIP ARTHROPLASTY  09/2010     Home Meds: Prior to Admission medications   Medication Sig Start Date End Date Taking? Authorizing Provider  acidophilus (RISAQUAD) CAPS capsule Take 1 capsule by mouth 2 (two) times daily. 06/26/16  Yes Bettey Costa, MD  aluminum-magnesium hydroxide-simethicone (MAALOX) 101-751-02 MG/5ML SUSP Take 30 mLs by mouth every 4 (four) hours as needed.   Yes Historical Provider, MD  aspirin EC 81 MG tablet Take 81 mg by mouth daily.    Yes Historical Provider, MD  atorvastatin (LIPITOR) 80 MG tablet Take 80 mg by mouth daily.   Yes Historical Provider, MD  calcium carbonate (OS-CAL) 600 MG TABS tablet Take 600 mg by mouth daily.   Yes Historical Provider, MD  Cholecalciferol 1000 units capsule Take 1,000 Units by mouth daily.    Yes Historical Provider, MD  diltiazem (CARDIZEM CD) 180 MG 24 hr capsule Take 1 capsule (180 mg total) by mouth daily. 11/06/16  Yes Gladstone Lighter, MD  docusate sodium (COLACE) 100 MG capsule Take 100 mg by mouth daily.   Yes Historical Provider, MD  furosemide (LASIX) 20 MG tablet Take 1 tablet (20 mg total) by mouth every other day. Patient taking differently: Take 20 mg by mouth daily.  04/06/16  Yes Rogelia Mire, NP  HYDROcodone-acetaminophen (NORCO) 10-325 MG tablet Take 1 tablet by mouth every 4 (four) hours as needed for moderate pain. Do not exceed 3gm of Tylenol in 24 hours 12/19/16  Yes Lauree Chandler, NP  LORazepam  (ATIVAN) 0.5 MG tablet Take 0.5 mg by mouth at bedtime.   Yes Historical Provider, MD  magnesium hydroxide (MILK OF MAGNESIA) 400 MG/5ML suspension Take 10 mLs by mouth daily.    Yes Historical Provider, MD  nitroGLYCERIN (NITROSTAT) 0.4 MG SL tablet Place 1 tablet (0.4 mg total) under the tongue every 5 (five) minutes as needed for chest pain. 01/24/13  Yes Minna Merritts, MD  ondansetron (ZOFRAN) 8 MG tablet Take 8 mg by mouth every 8 (eight) hours as needed for nausea or vomiting.   Yes Historical Provider, MD  Potassium Chloride ER 20 MEQ TBCR Take 20 mEq by mouth daily.    Yes Historical Provider, MD  sertraline (ZOLOFT) 50 MG tablet Take 150 mg by mouth at bedtime.    Yes Historical Provider, MD    Inpatient Medications:  . acidophilus  1 capsule Oral BID  . atorvastatin  80 mg Oral Daily  .  calcium carbonate  1 tablet Oral Daily  . cholecalciferol  1,000 Units Oral Daily  . diltiazem  180 mg Oral Daily  . famotidine (PEPCID) IV  20 mg Intravenous Q12H  . heparin  5,000 Units Subcutaneous Q8H  . LORazepam  0.5 mg Oral QHS  . mouth rinse  15 mL Mouth Rinse BID  . potassium chloride (KCL MULTIRUN) 30 mEq in 265 mL IVPB  30 mEq Intravenous Once  . sertraline  150 mg Oral QHS     Allergies:  Allergies  Allergen Reactions  . Citalopram Other (See Comments)    Reaction:  Altered mental status   . Cymbalta [Duloxetine Hcl] Other (See Comments)    Reaction:  Sedative for pt   . Imipramine Other (See Comments)    Reaction:  Unknown   . Proton Pump Inhibitors Other (See Comments)    Reaction:  Unknown   . Venlafaxine Nausea And Vomiting and Other (See Comments)    Reaction:  Dizziness     Social History   Social History  . Marital status: Widowed    Spouse name: N/A  . Number of children: N/A  . Years of education: N/A   Occupational History  . Not on file.   Social History Main Topics  . Smoking status: Never Smoker  . Smokeless tobacco: Never Used  . Alcohol use No   . Drug use: No  . Sexual activity: No   Other Topics Concern  . Not on file   Social History Narrative  . No narrative on file     Family History  Problem Relation Age of Onset  . Heart disease Sister   . Heart disease Brother   . Heart disease Brother   . Heart disease Brother   . Diabetes Other   . Anesthesia problems Neg Hx   . Hypotension Neg Hx   . Malignant hyperthermia Neg Hx   . Pseudochol deficiency Neg Hx      Review of Systems Positive for Nausea vomiting and weakness fatigue Negative for: General:  chills, fever, night sweats or weight changes.  Cardiovascular: PND orthopnea syncope dizziness  Dermatological skin lesions rashes Respiratory: Cough congestion Urologic: Frequent urination urination at night and hematuria Abdominal: Positive for nausea, vomiting, diarrhea, negative for bright red blood per rectum, melena, or hematemesis Neurologic: negative for visual changes, and/or hearing changes  All other systems reviewed and are otherwise negative except as noted above.  Labs:  Recent Labs  03/05/17 1526 03/05/17 1826  TROPONINI 0.03* 0.03*   Lab Results  Component Value Date   WBC 6.4 03/08/2017   HGB 12.2 03/08/2017   HCT 37.4 03/08/2017   MCV 81.7 03/08/2017   PLT 269 03/08/2017    Recent Labs Lab 03/06/17 1727  03/08/17 0520  NA 136  < > 143  K 2.5*  < > 3.1*  CL 104  < > 107  CO2 22  < > 26  BUN <5*  < > 6  CREATININE 0.64  < > 0.53  CALCIUM 8.2*  < > 8.5*  PROT 6.5  --   --   BILITOT 0.8  --   --   ALKPHOS 57  --   --   ALT 52  --   --   AST 188*  --   --   GLUCOSE 124*  < > 120*  < > = values in this interval not displayed. No results found for: CHOL, HDL, LDLCALC, TRIG No results found for:  DDIMER  Radiology/Studies:  Dg Abd 2 Views  Result Date: 03/08/2017 CLINICAL DATA:  Nausea and vomiting, pain EXAM: ABDOMEN - 2 VIEW COMPARISON:  10/11/2016 FINDINGS: Visualized bowel gas pattern is nonobstructive. Possible  thickening of left lower quadrant bowel loops, likely colon. There is no free intraperitoneal air. Patient has had right hip arthroplasty. IMPRESSION: Possible thickening of left lower quadrant bowel loops. Consider further evaluation of the abdomen and pelvis with CT exam with intravenous and oral contrast if symptoms persist. Electronically Signed   By: Nolon Nations M.D.   On: 03/08/2017 08:51   US Abdomen Limited Ruq  Result Date: 03/08/2017 CLINICAL DATA:  Nausea and vomiting. EXAM: US ABDOMEN LIMITED - RIGHT UPPER QUADRANT COMPARISON:  KUB 03/08/2017.  Ultrasound 11/01/2016. FINDINGS: Gallbladder: Multiple tiny gallstones again noted. Largest measures 7 mm Gallbladder wall thickness 2 mm. Negative Murphy sign. Common bile duct: Diameter: 7 mm Liver: There is extremely echodense consistent fatty infiltration and/or hepatocellular disease. No focal hepatic abnormality identified however evaluation for focal lesion would be difficult due to the extreme density of the liver. Exam limited by body habitus. IMPRESSION: 1. Multiple tiny gallstones again noted. No evidence of cholecystitis or biliary distention . 2. Extremely dense liver consistent with fatty infiltration and/or hepatocellular disease. Exam limited by patient's body habitus. Electronically Signed   By: Marcello Moores  Register   On: 03/08/2017 09:17    EKG: Rapid ventricular pacing unclear whether there is any atrial fibrillation  Weights: Filed Weights   03/06/17 0053 03/06/17 0455 03/07/17 0655  Weight: 106.2 kg (234 lb 1.6 oz) 106.2 kg (234 lb 1.6 oz) 109.2 kg (240 lb 11.2 oz)     Physical Exam: Blood pressure (!) 141/93, pulse (!) 109, temperature 98.5 F (36.9 C), temperature source Oral, resp. rate 16, height 5\' 4"  (1.626 m), weight 109.2 kg (240 lb 11.2 oz), SpO2 97 %. Body mass index is 41.32 kg/m. General: Well developed, well nourished, in no acute distress. Head eyes ears nose throat: Normocephalic, atraumatic, sclera  non-icteric, no xanthomas, nares are without discharge. No apparent thyromegaly and/or mass  Lungs: Normal respiratory effort.  no wheezes, no rales,Few rhonchi.  Heart: Rapid with normal S1 S2. 2-3+ upper right sternal border murmur gallop, no rub, PMI is normal size and placement, carotid upstroke normal without bruit, jugular venous pressure is normal Abdomen: Soft, non-tender, non-distended with normoactive bowel sounds. No hepatomegaly. No rebound/guarding. No obvious abdominal masses. Abdominal aorta is normal size without bruit Extremities: Trace to 1+ edema. no cyanosis, no clubbing, no ulcers  Peripheral : 2+ bilateral upper extremity pulses, 2+ bilateral femoral pulses, 2+ bilateral dorsal pedal pulse Neuro: Alert and oriented. No facial asymmetry. No focal deficit. Moves all extremities spontaneously. Musculoskeletal: Normal muscle tone with kyphosis Psych:  Responds to questions appropriately with a normal affect.    Assessment: 83 year old female with known coronary artery disease status post coronary bypass graft aortic valve stenosis status post prosthesis having rapid ventricular rate likely secondary to current illness of nausea vomiting and gastric issues without evidence of congestive heart failure or myocardial infarction  Plan: 1. Continue supportive care of nausea vomiting and other possible gastric issues 2. Evaluate by telemetry and EKG to assess for atrial fibrillation and rapid ventricular rate but would use diltiazem for heart rate control with a goal heart rate between 60 and 90 bpm 3. Anticoagulation if able with heparin until further evaluation and assessment of atrial fibrillation versus normal sinus rhythm 4. Density cholesterol therapy for known coronary  artery disease using atorvastatin 5. Consider echocardiogram for further evaluation of aortic valve the stenosis status post repair and coronary artery bypass graft 6. Begin ambulation and start assessing and  treating above issues  Signed, Corey Skains M.D. [facility] 03/08/2017, 1:27 PM

## 2017-03-08 NOTE — Consult Note (Signed)
Michele Lame, MD [facility]  [address] [place], [place] [address] Phone: [phone] Fax : [phone]  Consultation  Referring Provider:     Dr. Mar Daring Primary Care Physician:  [name], MD Primary Gastroenterologist:          Reason for Consultation:     Nausea and vomiting  Date of Admission:  03/05/2017 Date of Consultation:  03/08/2017         HPI:   Michele Schultz is a 83 y.o. female who comes in with recurrent nausea vomiting. The patient had been seen at the end of last year in the hospital for the same reasons. The patient an upper endoscopy with no cause of the symptoms found. The patient suffers from severe aortic stenosis chronic diastolic heart failure morbid obesity and atrial fibrillation. The patient states that she has had nausea and vomiting and also reported that she had episodes of diarrhea associated with the nausea vomiting. The patient has not had no further diarrhea since admission. The patient also reports that she is very tired and unstable on her feet. The patient also seems very concerned with getting food today. She states she is very hungry. In the past her nausea vomiting were attributed to poor food choices because it would come around times when she would eat things that bothered her. The patient was admitted with a diagnosis of viral gastroenteritis.  Past Medical History:  Diagnosis Date  . Anemia   . Aortic stenosis, severe    a. s/p Magna Ease pericardial tissue valve size 21 mm replacement in 10/2011 for severe AS 11/12; b. echo 07/2015: EF 55-60% mod concentric LVH, GR1DD, LA mildly dilated, PASP 45 mm Hg  . Atrial tachycardia, paroxysmal (HCC)    a. with rate related LBBB.  . Cardiac pacemaker -st Judes    11/12  . Chronic diastolic heart failure ([place])    a. echo 2014: EF 55-60%, no RWMA, GR1DD, PASP 47 mm Hg; b. echo 07/2015: EF 55-60% mod concentric LVH, GR1DD, LA mildly dilated, PASP 45 mm Hg  . Complete heart block [facility])    a.  s/p St Jude PPM 10/2011 (Ser # [id number]).  . Coronary artery disease    a. s/p 2v CABG 11/12 (VG-LAD, VG-OM1); b. Lexiscan 08/2015: low risk, no ischemia, EF 55-65%.  . Enthesopathy of hip region   . Esophageal reflux    followed by Dr.Seigal. stabilized with a combination of Nexium and Zantac  . Fibromyalgia   . HLD (hyperlipidemia)   . Hypertensive heart disease   . Knee joint replacement by other means   . Morbid obesity ([place])   . Neuralgia, neuritis, and radiculitis, unspecified   . Neuropathy ([place])   . Onychia and paronychia of toe   . Osteoarthrosis, unspecified whether generalized or localized, pelvic region and thigh    mainly in her back and knees  . PAF (paroxysmal atrial fibrillation) ([place])    a. brief episodes of AF previously noted on device interrogations.  Marland Kitchen RAD (reactive airway disease)    a. chronic SOB  . Spinal stenosis   . Stress incontinence, female    followed by Dr.Cope  . Type II or unspecified type diabetes mellitus without mention of complication, not stated as uncontrolled    a. pt. reports that she is borderline   . Wide-complex tachycardia ([place])    a. Noted 4/10 - brief episode in ED. Noted again 4/21 in clinic->felt most likely to be atrial tach.  Past Surgical History:  Procedure Laterality Date  . AORTIC VALVE REPLACEMENT  10/17/2011   Procedure: AORTIC VALVE REPLACEMENT (AVR);  Surgeon: Melrose Nakayama, MD;  Location: [facility];  Service: Open Heart Surgery;  Laterality: N/A;  . CARDIAC CATHETERIZATION  08/2009   50% stenosis distal left main, 50% stenosis ostial left circumflex.   Marland Kitchen CARDIAC CATHETERIZATION     at [facility]  . CORONARY ARTERY BYPASS GRAFT  10/17/2011   Procedure: CORONARY ARTERY BYPASS GRAFTING (CABG);  Surgeon: Melrose Nakayama, MD;  Location: [facility];  Service: Open Heart Surgery;  Laterality: N/A;  Times Two, using right leg greater saphenous vein harvested endoscopically  . ESOPHAGOGASTRODUODENOSCOPY (EGD) WITH PROPOFOL N/A  10/12/2016   Procedure: ESOPHAGOGASTRODUODENOSCOPY (EGD) WITH PROPOFOL;  Surgeon: Michele Lame, MD;  Location: [facility] ENDOSCOPY;  Service: Endoscopy;  Laterality: N/A;  . EYE SURGERY     IOL/ after cataracts removed- bilateral   . NASAL SINUS SURGERY  2009  . PERMANENT PACEMAKER INSERTION N/A 10/20/2011   Procedure: PERMANENT PACEMAKER INSERTION;  Surgeon: Thompson Grayer, MD;  Location: [facility] CATH LAB;  Service: Cardiovascular;  Laterality: N/A;  . REPLACEMENT TOTAL KNEE  11/2006   right knee  . TOTAL HIP ARTHROPLASTY  09/2010    Prior to Admission medications   Medication Sig Start Date End Date Taking? Authorizing Provider  acidophilus (RISAQUAD) CAPS capsule Take 1 capsule by mouth 2 (two) times daily. 06/26/16  Yes Bettey Costa, MD  aluminum-magnesium hydroxide-simethicone (MAALOX) 474-259-56 MG/5ML SUSP Take 30 mLs by mouth every 4 (four) hours as needed.   Yes Historical Provider, MD  aspirin EC 81 MG tablet Take 81 mg by mouth daily.    Yes Historical Provider, MD  atorvastatin (LIPITOR) 80 MG tablet Take 80 mg by mouth daily.   Yes Historical Provider, MD  calcium carbonate (OS-CAL) 600 MG TABS tablet Take 600 mg by mouth daily.   Yes Historical Provider, MD  Cholecalciferol 1000 units capsule Take 1,000 Units by mouth daily.    Yes Historical Provider, MD  diltiazem (CARDIZEM CD) 180 MG 24 hr capsule Take 1 capsule (180 mg total) by mouth daily. 11/06/16  Yes Gladstone Lighter, MD  docusate sodium (COLACE) 100 MG capsule Take 100 mg by mouth daily.   Yes Historical Provider, MD  furosemide (LASIX) 20 MG tablet Take 1 tablet (20 mg total) by mouth every other day. Patient taking differently: Take 20 mg by mouth daily.  04/06/16  Yes Rogelia Mire, NP  HYDROcodone-acetaminophen (NORCO) 10-325 MG tablet Take 1 tablet by mouth every 4 (four) hours as needed for moderate pain. Do not exceed 3gm of Tylenol in 24 hours 12/19/16  Yes Lauree Chandler, NP  LORazepam (ATIVAN) 0.5 MG tablet Take 0.5 mg  by mouth at bedtime.   Yes Historical Provider, MD  magnesium hydroxide (MILK OF MAGNESIA) 400 MG/5ML suspension Take 10 mLs by mouth daily.    Yes Historical Provider, MD  nitroGLYCERIN (NITROSTAT) 0.4 MG SL tablet Place 1 tablet (0.4 mg total) under the tongue every 5 (five) minutes as needed for chest pain. 01/24/13  Yes Minna Merritts, MD  ondansetron (ZOFRAN) 8 MG tablet Take 8 mg by mouth every 8 (eight) hours as needed for nausea or vomiting.   Yes Historical Provider, MD  Potassium Chloride ER 20 MEQ TBCR Take 20 mEq by mouth daily.    Yes Historical Provider, MD  sertraline (ZOLOFT) 50 MG tablet Take 150 mg by mouth at bedtime.    Yes  Historical Provider, MD    Family History  Problem Relation Age of Onset  . Heart disease Sister   . Heart disease Brother   . Heart disease Brother   . Heart disease Brother   . Diabetes Other   . Anesthesia problems Neg Hx   . Hypotension Neg Hx   . Malignant hyperthermia Neg Hx   . Pseudochol deficiency Neg Hx      Social History  Substance Use Topics  . Smoking status: Never Smoker  . Smokeless tobacco: Never Used  . Alcohol use No    Allergies as of 03/05/2017 - Review Complete 03/05/2017  Allergen Reaction Noted  . Citalopram Other (See Comments) 03/21/2011  . Cymbalta [duloxetine hcl] Other (See Comments) 03/21/2011  . Imipramine Other (See Comments) 03/21/2011  . Proton pump inhibitors Other (See Comments) 03/21/2011  . Venlafaxine Nausea And Vomiting and Other (See Comments) 03/13/2016    Review of Systems:    All systems reviewed and negative except where noted in HPI.   Physical Exam:  Vital signs in last 24 hours: Temp:  [98.5 F (36.9 C)] 98.5 F (36.9 C) (04/05 0800) Pulse Rate:  [109] 109 (04/05 0800) Resp:  [16] 16 (04/05 0800) BP: (141)/(93) 141/93 (04/05 0800) SpO2:  [97 %] 97 % (04/05 0800) Last BM Date: 03/05/17 General:   Pleasant, cooperative in NAD Head:  Normocephalic and atraumatic. Eyes:   No  icterus.   Conjunctiva pink. PERRLA. Ears:  Normal auditory acuity. Neck:  Supple; no masses or thyroidomegaly Lungs: Respirations even and unlabored. Lungs clear to auscultation bilaterally.   No wheezes, crackles, or rhonchi.  Heart:  Regular rate and rhythm;  Without murmur, clicks, rubs or gallops Abdomen:  Soft, nondistended, nontender. Normal bowel sounds. No appreciable masses or hepatomegaly.  No rebound or guarding.  Rectal:  Not performed. Msk:  Symmetrical without gross deformities.    Extremities:  Without edema, cyanosis or clubbing. Neurologic:  Alert and oriented x3;  grossly normal neurologically. Skin:  Intact without significant lesions or rashes. Cervical Nodes:  No significant cervical adenopathy. Psych:  Alert and cooperative. Normal affect.  LAB RESULTS:  Recent Labs  03/06/17 0436 03/07/17 0532 03/08/17 0520  WBC 6.3 5.1 6.4  HGB 11.5* 11.7* 12.2  HCT 35.4 35.8 37.4  PLT 245 237 269   BMET  Recent Labs  03/06/17 1727 03/07/17 0532 03/08/17 0520  NA 136 143 143  K 2.5* 3.6 3.1*  CL 104 112* 107  CO2 22 24 26   GLUCOSE 124* 117* 120*  BUN <5* <5* 6  CREATININE 0.64 <0.30* 0.53  CALCIUM 8.2* 8.3* 8.5*   LFT  Recent Labs  03/06/17 1727  PROT 6.5  ALBUMIN 3.2*  AST 188*  ALT 52  ALKPHOS 57  BILITOT 0.8   PT/INR No results for input(s): LABPROT, INR in the last 72 hours.  STUDIES: Dg Abd 2 Views  Result Date: 03/08/2017 CLINICAL DATA:  Nausea and vomiting, pain EXAM: ABDOMEN - 2 VIEW COMPARISON:  10/11/2016 FINDINGS: Visualized bowel gas pattern is nonobstructive. Possible thickening of left lower quadrant bowel loops, likely colon. There is no free intraperitoneal air. Patient has had right hip arthroplasty. IMPRESSION: Possible thickening of left lower quadrant bowel loops. Consider further evaluation of the abdomen and pelvis with CT exam with intravenous and oral contrast if symptoms persist. Electronically Signed   By: Nolon Nations  M.D.   On: 03/08/2017 08:51   US Abdomen Limited Ruq  Result Date: 03/08/2017 CLINICAL DATA:  Nausea and vomiting. EXAM: US ABDOMEN LIMITED - RIGHT UPPER QUADRANT COMPARISON:  KUB 03/08/2017.  Ultrasound 11/01/2016. FINDINGS: Gallbladder: Multiple tiny gallstones again noted. Largest measures 7 mm Gallbladder wall thickness 2 mm. Negative Murphy sign. Common bile duct: Diameter: 7 mm Liver: There is extremely echodense consistent fatty infiltration and/or hepatocellular disease. No focal hepatic abnormality identified however evaluation for focal lesion would be difficult due to the extreme density of the liver. Exam limited by body habitus. IMPRESSION: 1. Multiple tiny gallstones again noted. No evidence of cholecystitis or biliary distention . 2. Extremely dense liver consistent with fatty infiltration and/or hepatocellular disease. Exam limited by patient's body habitus. Electronically Signed   By: Marcello Moores  Register   On: 03/08/2017 09:17      Impression / Plan:   Michele Schultz is a 83 y.o. y/o female with recurrent nausea and vomiting. The patient also was reported to have diarrhea prior to admission but has had no further diarrhea since. The patient had an upper endoscopy the last time she was in the hospital for the same reason without any findings of that time. I do not think that repeating the GI workup at this time would be beneficial. Other possible causes of her nausea and vomiting should be sought including her history of bad food choices in the past that have caused her nausea vomiting versus non-GI causes of her symptoms. The patient seems very concerned about eating and she will be started on a diet at this time. Nothing further to do from a GI point of view.  Thank you for involving me in the care of this patient.      LOS: 3 days   Michele Lame, MD  03/08/2017, 1:30 PM   Note: This dictation was prepared with Dragon dictation along with smaller phrase technology. Any  transcriptional errors that result from this process are unintentional.

## 2017-03-09 ENCOUNTER — Inpatient Hospital Stay: Payer: Medicare Other

## 2017-03-09 LAB — POTASSIUM: Potassium: 3 mmol/L — ABNORMAL LOW (ref 3.5–5.1)

## 2017-03-09 LAB — MAGNESIUM: MAGNESIUM: 1.7 mg/dL (ref 1.7–2.4)

## 2017-03-09 LAB — GLUCOSE, CAPILLARY: Glucose-Capillary: 125 mg/dL — ABNORMAL HIGH (ref 65–99)

## 2017-03-09 MED ORDER — PROMETHAZINE HCL 25 MG PO TABS
12.5000 mg | ORAL_TABLET | Freq: Four times a day (QID) | ORAL | Status: DC | PRN
  Administered 2017-03-09: 12.5 mg via ORAL
  Filled 2017-03-09: qty 1

## 2017-03-09 MED ORDER — POTASSIUM CHLORIDE CRYS ER 20 MEQ PO TBCR
40.0000 meq | EXTENDED_RELEASE_TABLET | Freq: Once | ORAL | Status: AC
  Administered 2017-03-09: 40 meq via ORAL
  Filled 2017-03-09: qty 2

## 2017-03-09 MED ORDER — MAGNESIUM OXIDE 400 MG PO TABS: 400.0000 mg | ORAL_TABLET | Freq: Every day | ORAL | 0 refills | Status: AC

## 2017-03-09 MED ORDER — LORAZEPAM 0.5 MG PO TABS: 0.5000 mg | ORAL_TABLET | Freq: Every day | ORAL | 0 refills | Status: DC

## 2017-03-09 MED ORDER — METOCLOPRAMIDE HCL 5 MG PO TABS: 5.0000 mg | ORAL_TABLET | Freq: Three times a day (TID) | ORAL | 0 refills | Status: AC

## 2017-03-09 MED ORDER — FUROSEMIDE 20 MG PO TABS: 20.0000 mg | ORAL_TABLET | Freq: Every day | ORAL | 0 refills | Status: DC

## 2017-03-09 MED ORDER — PROMETHAZINE HCL 12.5 MG PO TABS: 12.5000 mg | ORAL_TABLET | Freq: Four times a day (QID) | ORAL | 0 refills | Status: AC | PRN

## 2017-03-09 MED ORDER — METOCLOPRAMIDE HCL 10 MG PO TABS
5.0000 mg | ORAL_TABLET | Freq: Three times a day (TID) | ORAL | Status: DC
  Administered 2017-03-09 (×3): 5 mg via ORAL
  Filled 2017-03-09 (×3): qty 1

## 2017-03-09 MED ORDER — POTASSIUM CHLORIDE ER 20 MEQ PO TBCR
20.0000 meq | EXTENDED_RELEASE_TABLET | Freq: Two times a day (BID) | ORAL | 0 refills | Status: AC
Start: 2017-03-09 — End: ?

## 2017-03-09 MED ORDER — MAGNESIUM SULFATE 2 GM/50ML IV SOLN
2.0000 g | Freq: Once | INTRAVENOUS | Status: AC
  Administered 2017-03-09: 2 g via INTRAVENOUS
  Filled 2017-03-09: qty 50

## 2017-03-09 MED ORDER — FAMOTIDINE 20 MG PO TABS: 20.0000 mg | ORAL_TABLET | Freq: Two times a day (BID) | ORAL | 0 refills | Status: AC

## 2017-03-09 MED ORDER — HYDROCODONE-ACETAMINOPHEN 10-325 MG PO TABS: 1.0000 | ORAL_TABLET | ORAL | 0 refills | Status: DC | PRN

## 2017-03-09 NOTE — Progress Notes (Signed)
Pt being discharged to [facility] today. PIVs removed. Discharge instructions reviewed with pt, all questions answered. All new prescriptions and instructions provided in packet. Report called to Butch Penny at [facility]. She is leaving with all her belongings, will be transported via EMS.

## 2017-03-09 NOTE — Discharge Instructions (Signed)
Nausea, Adult  Nausea is the feeling of an upset stomach or having to vomit. Nausea on its own is not usually a serious concern, but it may be an early sign of a more serious medical problem. As nausea gets worse, it can lead to vomiting. If vomiting develops, or if you are not able to drink enough fluids, you are at risk of becoming dehydrated. Dehydration can make you tired and thirsty, cause you to have a dry mouth, and decrease how often you urinate. Older adults and people with other diseases or a weak immune system are at higher risk for dehydration. The main goals of treating your nausea are:  · To limit repeated nausea episodes.  · To prevent vomiting and dehydration.    Follow these instructions at home:  Follow instructions from your health care provider about how to care for yourself at home.  Eating and drinking   Follow these recommendations as told by your health care provider:  · Take an oral rehydration solution (ORS). This is a drink that is sold at pharmacies and retail stores.  · Drink clear fluids in small amounts as you are able. Clear fluids include water, ice chips, diluted fruit juice, and low-calorie sports drinks.  · Eat bland, easy-to-digest foods in small amounts as you are able. These foods include bananas, applesauce, rice, lean meats, toast, and crackers.  · Avoid drinking fluids that contain a lot of sugar or caffeine, such as energy drinks, sports drinks, and soda.  · Avoid alcohol.  · Avoid spicy or fatty foods.    General instructions   · Drink enough fluid to keep your urine clear or pale yellow.  · Wash your hands often. If soap and water are not available, use hand sanitizer.  · Make sure that all people in your household wash their hands well and often.  · Rest at home while you recover.  · Take over-the-counter and prescription medicines only as told by your health care provider.  · Breathe slowly and deeply when you feel nauseous.  · Watch your condition for any  changes.  · Keep all follow-up visits as told by your health care provider. This is important.  Contact a health care provider if:  · You have a headache.  · You have new symptoms.  · Your nausea gets worse.  · You have a fever.  · You feel light-headed or dizzy.  · You vomit.  · You cannot keep fluids down.  Get help right away if:  · You have pain in your chest, neck, arm, or jaw.  · You feel extremely weak or you faint.  · You have vomit that is bright red or looks like coffee grounds.  · You have bloody or black stools or stools that look like tar.  · You have a severe headache, a stiff neck, or both.  · You have severe pain, cramping, or bloating in your abdomen.  · You have a rash.  · You have difficulty breathing or are breathing very quickly.  · Your heart is beating very quickly.  · Your skin feels cold and clammy.  · You feel confused.  · You have pain when you urinate.  · You have signs of dehydration, such as:  ? Dark urine, very little, or no urine.  ? Cracked lips.  ? Dry mouth.  ? Sunken eyes.  ? Sleepiness.  ? Weakness.  These symptoms may represent a serious problem that is an emergency. Do   not wait to see if the symptoms will go away. Get medical help right away. Call your local emergency services (911 in the [place] Do not drive yourself to the hospital.  This information is not intended to replace advice given to you by your health care provider. Make sure you discuss any questions you have with your health care provider.  Document Released: 12/28/2004 Document Revised: 04/24/2016 Document Reviewed: 07/27/2015  Elsevier Interactive Patient Education © 2017 Elsevier Inc.

## 2017-03-09 NOTE — Progress Notes (Addendum)
Patient is medically stable for D/C back to [facility] today. Per [facility] and Wellness director at [facility] patient can return today. Clinical Education officer, museum (CSW) sent D/C orders to [facility]. RN will call report and arrange EMS for transport. Patient is aware of above. CSW left patient's niece Michele Schultz a voicemail making her aware of above. Please reconsult if future social work needs arise. CSW signing off.   [name] Michele Schultz called CSW back and is in agreement with D/C plan.   [employer], LCSW [phone]

## 2017-03-09 NOTE — NC FL2 (Signed)
[place] LEVEL OF CARE SCREENING TOOL     IDENTIFICATION  Patient Name: CELESTINE PRIM Birthdate: 12/18/1930 Sex: female Admission Date (Current Location): 03/05/2017  [place] and [place] Number:  Engineering geologist and Address:  [facility], [address] [place], [place] [address]      Provider Number: [id number]  Attending Physician Name and Address:  Loletha Grayer, MD  Relative Name and Phone Number:       Current Level of Care: Hospital Recommended Level of Care: [place] Prior Approval Number:    Date Approved/Denied:   PASRR Number:  [id number])  Discharge Plan: Other (Comment) [facility])    Current Diagnoses: Patient Active Problem List   Diagnosis Date Noted  . Nausea vomiting and diarrhea 03/05/2017  . Adjustment disorder with depressed mood 11/02/2016  . Nausea & vomiting 11/01/2016  . Nausea and vomiting   . Atrial fibrillation with RVR ([place]) 10/11/2016  . Hypokalemia 10/11/2016  . DNR (do not resuscitate) 09/19/2016  . Palliative care by specialist 09/19/2016  . Muscle weakness (generalized)   . Atrial tachycardia ([place])   . Diarrhea 09/17/2016  . Dehydration 09/17/2016  . Metabolic acidosis 96/22/2979  . Headache due to trauma 07/03/2016  . Wide-complex tachycardia ([place])   . Pain in the chest   . Emesis   . Weakness of both legs   . Coronary artery disease involving coronary bypass graft of native heart with unspecified angina pectoris   . S/P AVR (aortic valve replacement)   . Falls frequently   . HLD (hyperlipidemia)   . Fibromyalgia   . RAD (reactive airway disease)   . Morbid obesity ([place])   . Aortic stenosis, severe   . Chronic pain 06/17/2014  . Pain in the muscles 06/19/2013  . Chest pain 06/19/2013  . Wheezing 06/19/2013  . Weakness generalized 06/03/2013  . Dyspnea 01/27/2013  . Nausea 03/05/2012  . Constipation 03/05/2012  . Atrial fibrillation-non sustained  02/09/2012  . Cardiac pacemaker -st Judes   . Chronic diastolic heart failure ([place])   . Coronary artery disease   . Long term (current) use of anticoagulants 01/03/2012  . S/P CABG x 2 11/14/2011  . S/P aortic valve replacement with porcine valve 11/14/2011  . Back pain 11/14/2011  . Complete heart block ([place]) 10/20/2011  . Hypertensive heart disease with CHF (congestive heart failure) ([place]) 09/15/2011  . Spinal stenosis 03/22/2011  . Fatigue 03/22/2011  . Hyperlipidemia 03/22/2011    Orientation RESPIRATION BLADDER Height & Weight     Self, Time, Situation, Place  Normal Incontinent Weight: 241 lb 2.9 oz (109.4 kg) Height:  5\' 4"  (162.6 cm)  BEHAVIORAL SYMPTOMS/MOOD NEUROLOGICAL BOWEL NUTRITION STATUS   (None)  (None. ) Incontinent Regular Diet   AMBULATORY STATUS COMMUNICATION OF NEEDS Skin   Extensive Assist Verbally Normal                       Personal Care Assistance Level of Assistance  Bathing, Feeding, Dressing Bathing Assistance: Limited assistance Feeding assistance: Independent Dressing Assistance: Limited assistance     Functional Limitations Info  Sight, Hearing, Speech Sight Info: Adequate Hearing Info: Adequate Speech Info: Adequate    SPECIAL CARE FACTORS FREQUENCY  PT (By licensed PT), OT (By licensed OT)    PT home health 2-3 days per week.                 Contractures  Additional Factors Info  Code Status, Allergies Code Status Info:  (Full Code) Allergies Info:  (Citalopram, Cymbalta Duloxetine Hcl, Imipramine, Proton Pump Inhibitors, Venlafaxine)          Discharge Medications: Please see discharge summary for a list of discharge medications. Current Discharge Medication List       START taking these medications   Details  famotidine (PEPCID) 20 MG tablet Take 1 tablet (20 mg total) by mouth 2 (two) times daily. Qty: 60 tablet, Refills: 0    magnesium oxide (MAG-OX) 400 MG tablet Take 1 tablet (400 mg total) by  mouth daily. Qty: 30 tablet, Refills: 0    metoCLOPramide (REGLAN) 5 MG tablet Take 1 tablet (5 mg total) by mouth 4 (four) times daily -  before meals and at bedtime. Qty: 120 tablet, Refills: 0    promethazine (PHENERGAN) 12.5 MG tablet Take 1 tablet (12.5 mg total) by mouth every 6 (six) hours as needed for nausea. Qty: 30 tablet, Refills: 0         CONTINUE these medications which have CHANGED   Details  furosemide (LASIX) 20 MG tablet Take 1 tablet (20 mg total) by mouth daily. Qty: 30 tablet, Refills: 0    HYDROcodone-acetaminophen (NORCO) 10-325 MG tablet Take 1 tablet by mouth every 4 (four) hours as needed for moderate pain. Do not exceed 3gm of Tylenol in 24 hours Qty: 15 tablet, Refills: 0    LORazepam (ATIVAN) 0.5 MG tablet Take 1 tablet (0.5 mg total) by mouth at bedtime. Qty: 30 tablet, Refills: 0    Potassium Chloride ER 20 MEQ TBCR Take 20 mEq by mouth 2 (two) times daily. Qty: 60 tablet, Refills: 0         CONTINUE these medications which have NOT CHANGED   Details  acidophilus (RISAQUAD) CAPS capsule Take 1 capsule by mouth 2 (two) times daily. Qty: 1 capsule, Refills: 0    aluminum-magnesium hydroxide-simethicone (MAALOX) [phone] MG/5ML SUSP Take 30 mLs by mouth every 4 (four) hours as needed.    aspirin EC 81 MG tablet Take 81 mg by mouth daily.     atorvastatin (LIPITOR) 80 MG tablet Take 80 mg by mouth daily.    calcium carbonate (OS-CAL) 600 MG TABS tablet Take 600 mg by mouth daily.    Cholecalciferol 1000 units capsule Take 1,000 Units by mouth daily.     diltiazem (CARDIZEM CD) 180 MG 24 hr capsule Take 1 capsule (180 mg total) by mouth daily. Qty: 30 capsule, Refills: 2    docusate sodium (COLACE) 100 MG capsule Take 100 mg by mouth daily.    magnesium hydroxide (MILK OF MAGNESIA) 400 MG/5ML suspension Take 10 mLs by mouth daily.     nitroGLYCERIN (NITROSTAT) 0.4 MG SL tablet Place 1 tablet (0.4 mg total) under the  tongue every 5 (five) minutes as needed for chest pain. Qty: 90 tablet, Refills: 3    ondansetron (ZOFRAN) 8 MG tablet Take 8 mg by mouth every 8 (eight) hours as needed for nausea or vomiting.    sertraline (ZOLOFT) 50 MG tablet Take 150 mg by mouth at bedtime.     Relevant Imaging Results: Relevant Lab Results: Additional Information  (SSN: [id number])  Galya Dunnigan, Veronia Beets, LCSW

## 2017-03-09 NOTE — Discharge Summary (Signed)
[place] [facility] [place] NAME: Michele Schultz    MR#:  [id number]  DATE OF BIRTH:  04/10/1931  DATE OF ADMISSION:  03/05/2017 ADMITTING PHYSICIAN: Max Sane, MD  DATE OF DISCHARGE: 03/09/2017  PRIMARY CARE PHYSICIAN: [name], MD    ADMISSION DIAGNOSIS:  Nausea vomiting and diarrhea [R11.2, R19.7]  DISCHARGE DIAGNOSIS:  Active Problems:   Nausea vomiting and diarrhea   SECONDARY DIAGNOSIS:   Past Medical History:  Diagnosis Date  . Anemia   . Aortic stenosis, severe    a. s/p Magna Ease pericardial tissue valve size 21 mm replacement in 10/2011 for severe AS 11/12; b. echo 07/2015: EF 55-60% mod concentric LVH, GR1DD, LA mildly dilated, PASP 45 mm Hg  . Atrial tachycardia, paroxysmal (HCC)    a. with rate related LBBB.  . Cardiac pacemaker -st Judes    11/12  . Chronic diastolic heart failure ([place])    a. echo 2014: EF 55-60%, no RWMA, GR1DD, PASP 47 mm Hg; b. echo 07/2015: EF 55-60% mod concentric LVH, GR1DD, LA mildly dilated, PASP 45 mm Hg  . Complete heart block [facility])    a. s/p St Jude PPM 10/2011 (Ser # [id number]).  . Coronary artery disease    a. s/p 2v CABG 11/12 (VG-LAD, VG-OM1); b. Lexiscan 08/2015: low risk, no ischemia, EF 55-65%.  . Enthesopathy of hip region   . Esophageal reflux    followed by Dr.Seigal. stabilized with a combination of Nexium and Zantac  . Fibromyalgia   . HLD (hyperlipidemia)   . Hypertensive heart disease   . Knee joint replacement by other means   . Morbid obesity ([place])   . Neuralgia, neuritis, and radiculitis, unspecified   . Neuropathy ([place])   . Onychia and paronychia of toe   . Osteoarthrosis, unspecified whether generalized or localized, pelvic region and thigh    mainly in her back and knees  . PAF (paroxysmal atrial fibrillation) ([place])    a. brief episodes of AF previously noted on device interrogations.  [facility] (reactive airway disease)    a. chronic SOB  . Spinal stenosis   .  Stress incontinence, female    followed by Dr.Cope  . Type II or unspecified type diabetes mellitus without mention of complication, not stated as uncontrolled    a. pt. reports that she is borderline   . Wide-complex tachycardia ([place])    a. Noted 4/10 - brief episode in ED. Noted again 4/21 in clinic->felt most likely to be atrial tach.    HOSPITAL COURSE:   1.  Nausea vomiting diarrhea. The patient was watched here during the hospital. No further episodes of vomiting or diarrhea. The patient complains of persistent nausea. Reglan and Phenergan were tried. The patient was tolerating a diet but states that she feels nauseous. She only spits up saliva not vomiting. Gastroenterology saw the patient and did not want to repeat an endoscopy. The patient is well known by the social worker and care manager team about not wanting to go back to her residence. At this is our only option. We'll set up home with home health at her facility. Recommend commode at the bedside. We'll get a quick CAT scan of the head to rule out central cause, if this is negative can go back to her facility. 2. Hypokalemia and hypomagnesemia. Replace potassium orally and magnesium IV. Replace both orally upon discharge 3. Atrial fibrillation. Initially had rapid rate. Continue Cardizem CD. On aspirin for anticoagulation.  4. History of diastolic congestive heart failure. No signs of congestive heart failure at this time. Restart Lasix. History of aortic stenosis in the past with replacement.  5. Hyperlipidemia unspecified on atorvastatin 6. Depression on Zoloft  Patient's major complaint is that nobody is able to take care of her. She does not have any family. In speaking with the social worker her facility is the only option. We'll set up home with home health there. Recommend commode at the bedside.  DISCHARGE CONDITIONS:   Fair  CONSULTS OBTAINED:  Treatment Team:  Jonathon Bellows, MD Corey Skains, MD  DRUG ALLERGIES:    Allergies  Allergen Reactions  . Citalopram Other (See Comments)    Reaction:  Altered mental status   . Cymbalta [Duloxetine Hcl] Other (See Comments)    Reaction:  Sedative for pt   . Imipramine Other (See Comments)    Reaction:  Unknown   . Proton Pump Inhibitors Other (See Comments)    Reaction:  Unknown   . Venlafaxine Nausea And Vomiting and Other (See Comments)    Reaction:  Dizziness     DISCHARGE MEDICATIONS:   Current Discharge Medication List    START taking these medications   Details  famotidine (PEPCID) 20 MG tablet Take 1 tablet (20 mg total) by mouth 2 (two) times daily. Qty: 60 tablet, Refills: 0    magnesium oxide (MAG-OX) 400 MG tablet Take 1 tablet (400 mg total) by mouth daily. Qty: 30 tablet, Refills: 0    metoCLOPramide (REGLAN) 5 MG tablet Take 1 tablet (5 mg total) by mouth 4 (four) times daily -  before meals and at bedtime. Qty: 120 tablet, Refills: 0    promethazine (PHENERGAN) 12.5 MG tablet Take 1 tablet (12.5 mg total) by mouth every 6 (six) hours as needed for nausea. Qty: 30 tablet, Refills: 0      CONTINUE these medications which have CHANGED   Details  furosemide (LASIX) 20 MG tablet Take 1 tablet (20 mg total) by mouth daily. Qty: 30 tablet, Refills: 0    HYDROcodone-acetaminophen (NORCO) 10-325 MG tablet Take 1 tablet by mouth every 4 (four) hours as needed for moderate pain. Do not exceed 3gm of Tylenol in 24 hours Qty: 15 tablet, Refills: 0    LORazepam (ATIVAN) 0.5 MG tablet Take 1 tablet (0.5 mg total) by mouth at bedtime. Qty: 30 tablet, Refills: 0    Potassium Chloride ER 20 MEQ TBCR Take 20 mEq by mouth 2 (two) times daily. Qty: 60 tablet, Refills: 0      CONTINUE these medications which have NOT CHANGED   Details  acidophilus (RISAQUAD) CAPS capsule Take 1 capsule by mouth 2 (two) times daily. Qty: 1 capsule, Refills: 0    aluminum-magnesium hydroxide-simethicone (MAALOX) 734-287-68 MG/5ML SUSP Take 30 mLs by mouth  every 4 (four) hours as needed.    aspirin EC 81 MG tablet Take 81 mg by mouth daily.     atorvastatin (LIPITOR) 80 MG tablet Take 80 mg by mouth daily.    calcium carbonate (OS-CAL) 600 MG TABS tablet Take 600 mg by mouth daily.    Cholecalciferol 1000 units capsule Take 1,000 Units by mouth daily.     diltiazem (CARDIZEM CD) 180 MG 24 hr capsule Take 1 capsule (180 mg total) by mouth daily. Qty: 30 capsule, Refills: 2    docusate sodium (COLACE) 100 MG capsule Take 100 mg by mouth daily.    magnesium hydroxide (MILK OF MAGNESIA) 400 MG/5ML suspension Take 10  mLs by mouth daily.     nitroGLYCERIN (NITROSTAT) 0.4 MG SL tablet Place 1 tablet (0.4 mg total) under the tongue every 5 (five) minutes as needed for chest pain. Qty: 90 tablet, Refills: 3    ondansetron (ZOFRAN) 8 MG tablet Take 8 mg by mouth every 8 (eight) hours as needed for nausea or vomiting.    sertraline (ZOLOFT) 50 MG tablet Take 150 mg by mouth at bedtime.          DISCHARGE INSTRUCTIONS:   Follow-up PMD one week Home health set up  If you experience worsening of your admission symptoms, develop shortness of breath, life threatening emergency, suicidal or homicidal thoughts you must seek medical attention immediately by calling 911 or calling your MD immediately  if symptoms less severe.  You Must read complete instructions/literature along with all the possible adverse reactions/side effects for all the Medicines you take and that have been prescribed to you. Take any new Medicines after you have completely understood and accept all the possible adverse reactions/side effects.   Please note  You were cared for by a hospitalist during your hospital stay. If you have any questions about your discharge medications or the care you received while you were in the hospital after you are discharged, you can call the unit and asked to speak with the hospitalist on call if the hospitalist that took care of you is not  available. Once you are discharged, your primary care physician will handle any further medical issues. Please note that NO REFILLS for any discharge medications will be authorized once you are discharged, as it is imperative that you return to your primary care physician (or establish a relationship with a primary care physician if you do not have one) for your aftercare needs so that they can reassess your need for medications and monitor your lab values.    Today   CHIEF COMPLAINT:   Chief Complaint  Patient presents with  . Emesis    HISTORY OF PRESENT ILLNESS:  Ebelyn Bohnet  is a 83 y.o. female presents with vomiting.   VITAL SIGNS:  Blood pressure (!) 155/78, pulse (!) 105, temperature 97.7 F (36.5 C), temperature source Oral, resp. rate 18, height 5\' 4"  (1.626 m), weight 109.4 kg (241 lb 2.9 oz), SpO2 96 %.    PHYSICAL EXAMINATION:  GENERAL:  83 y.o.-year-old patient lying in the bed with no acute distress.  EYES: Pupils equal, round, reactive to light and accommodation. No scleral icterus. Extraocular muscles intact.  HEENT: Head atraumatic, normocephalic. Oropharynx and nasopharynx clear.  NECK:  Supple, no jugular venous distention. No thyroid enlargement, no tenderness.  LUNGS: Normal breath sounds bilaterally, no wheezing, rales,rhonchi or crepitation. No use of accessory muscles of respiration.  CARDIOVASCULAR: S1, S2 normal. No murmurs, rubs, or gallops.  ABDOMEN: Soft, non-tender, non-distended. Bowel sounds present. No organomegaly or mass.  EXTREMITIES: 2+ edema,  no cyanosis, or clubbing.  NEUROLOGIC: Cranial nerves II through XII are intact. PSYCHIATRIC: The patient is alert and oriented x 3.  SKIN: No obvious rash, lesion, or ulcer.   DATA REVIEW:   CBC  Recent Labs Lab 03/08/17 0520  WBC 6.4  HGB 12.2  HCT 37.4  PLT 269    Chemistries   Recent Labs Lab 03/06/17 1727  03/08/17 0520 03/09/17 0904  NA 136  < > 143  --   K 2.5*  < > 3.1*  3.0*  CL 104  < > 107  --   CO2  22  < > 26  --   GLUCOSE 124*  < > 120*  --   BUN <5*  < > 6  --   CREATININE 0.64  < > 0.53  --   CALCIUM 8.2*  < > 8.5*  --   MG  --   --   --  1.7  AST 188*  --   --   --   ALT 52  --   --   --   ALKPHOS 57  --   --   --   BILITOT 0.8  --   --   --   < > = values in this interval not displayed.  Cardiac Enzymes  Recent Labs Lab 03/05/17 1826  TROPONINI 0.03*    Microbiology Results  Results for orders placed or performed during the hospital encounter of 03/05/17  MRSA PCR Screening     Status: None   Collection Time: 03/06/17 12:51 AM  Result Value Ref Range Status   MRSA by PCR NEGATIVE NEGATIVE Final    Comment:        The GeneXpert MRSA Assay (FDA approved for NASAL specimens only), is one component of a comprehensive MRSA colonization surveillance program. It is not intended to diagnose MRSA infection nor to guide or monitor treatment for MRSA infections.     RADIOLOGY:  Dg Abd 2 Views  Result Date: 03/08/2017 CLINICAL DATA:  Nausea and vomiting, pain EXAM: ABDOMEN - 2 VIEW COMPARISON:  10/11/2016 FINDINGS: Visualized bowel gas pattern is nonobstructive. Possible thickening of left lower quadrant bowel loops, likely colon. There is no free intraperitoneal air. Patient has had right hip arthroplasty. IMPRESSION: Possible thickening of left lower quadrant bowel loops. Consider further evaluation of the abdomen and pelvis with CT exam with intravenous and oral contrast if symptoms persist. Electronically Signed   By: Nolon Nations M.D.   On: 03/08/2017 08:51   US Abdomen Limited Ruq  Result Date: 03/08/2017 CLINICAL DATA:  Nausea and vomiting. EXAM: US ABDOMEN LIMITED - RIGHT UPPER QUADRANT COMPARISON:  KUB 03/08/2017.  Ultrasound 11/01/2016. FINDINGS: Gallbladder: Multiple tiny gallstones again noted. Largest measures 7 mm Gallbladder wall thickness 2 mm. Negative Murphy sign. Common bile duct: Diameter: 7 mm Liver: There is  extremely echodense consistent fatty infiltration and/or hepatocellular disease. No focal hepatic abnormality identified however evaluation for focal lesion would be difficult due to the extreme density of the liver. Exam limited by body habitus. IMPRESSION: 1. Multiple tiny gallstones again noted. No evidence of cholecystitis or biliary distention . 2. Extremely dense liver consistent with fatty infiltration and/or hepatocellular disease. Exam limited by patient's body habitus. Electronically Signed   By: Marcello Moores  Register   On: 03/08/2017 09:17    Management plans discussed with the patient, Nursing staff and care manager and social worker.   CODE STATUS:     Code Status Orders        Start     Ordered   03/06/17 0033  Full code  Continuous     03/06/17 0032    Code Status History    Date Active Date Inactive Code Status Order ID Comments User Context   03/05/2017 10:41 PM 03/06/2017 12:32 AM DNR [id number]  Max Sane, MD ED   11/01/2016  8:09 AM 11/06/2016  4:51 PM DNR [id number]  Epifanio Lesches, MD ED   10/12/2016  1:36 AM 10/21/2016  6:48 PM DNR [id number]  Lance Coon, MD Inpatient   09/19/2016 10:52 AM 09/22/2016  8:50 PM  DNR [id number]  Knox Royalty, NP Inpatient   09/17/2016  5:57 PM 09/19/2016 10:52 AM Full Code [id number]  Theodoro Grist, MD Inpatient   06/23/2016  8:20 PM 06/26/2016  1:26 PM Full Code [id number]  Gladstone Lighter, MD Inpatient   06/16/2016  8:35 PM 06/21/2016 10:09 PM Full Code [id number]  Lytle Butte, MD ED   03/13/2016  8:15 PM 03/14/2016  9:02 PM Full Code [id number]  Henreitta Leber, MD Inpatient   09/13/2015  6:51 PM 09/18/2015  7:31 PM Full Code [id number]  Dustin Flock, MD Inpatient   10/20/2011  1:40 PM 10/22/2011  1:27 PM Full Code [id number]  Tiajuana Amass, RN Inpatient    Advance Directive Documentation     Most Recent Value  Type of Advance Directive  Healthcare Power of Attorney  Pre-existing out of facility DNR order (yellow form or pink MOST form)  -   "MOST" Form in Place?  -      TOTAL TIME TAKING CARE OF THIS PATIENT: 35 minutes.    Loletha Grayer M.D on 03/09/2017 at 1:39 PM  Between 7am to 6pm - Pager - [phone]  After 6pm go to [email or web address] - password [email or web address]  [facility] Office  [phone]  CC: Primary care physician; [name], MD

## 2017-03-09 NOTE — Progress Notes (Signed)
PT Cancellation Note  Patient Details Name: Michele Schultz MRN: [id number] DOB: [birth year]   Cancelled Treatment:    Reason Eval/Treat Not Completed: Other (comment) (Pt refusing and has been at baseline 2-3 person transfer SNF).  Will sign off as pt reports she is vomiting but actually has not but is not interested in PT.  At [facility] and will sign off for now.  No further needs identified.   Ramond Dial 03/09/2017, 10:49 AM   Mee Hives, PT MS Acute Rehab Dept. Number: [place] and [place]

## 2017-03-09 NOTE — Progress Notes (Signed)
[facility] Cardiology [facility] Encounter Note  Patient: Michele Schultz / Admit Date: 03/05/2017 / Date of Encounter: 03/09/2017, 8:39 AM   Subjective: Patient with some abdominal discomfort and mild nausea but no vomiting. Heart rate controlled. No evidence of congestive heart failure  Review of Systems: Positive for: Nausea Negative for: Vision change, hearing change, syncope, dizziness, vomiting,diarrhea, bloody stool, stomach pain, cough, congestion, diaphoresis, urinary frequency, urinary pain,skin lesions, skin rashes Others previously listed  Objective: Telemetry: No telemetry at this time but will repeat EKG Physical Exam: Blood pressure (!) 155/78, pulse (!) 105, temperature 97.7 F (36.5 C), temperature source Oral, resp. rate 18, height 5\' 4"  (1.626 m), weight 109.4 kg (241 lb 2.9 oz), SpO2 96 %. Body mass index is 41.4 kg/m. General: Well developed, well nourished, in no acute distress. Head: Normocephalic, atraumatic, sclera non-icteric, no xanthomas, nares are without discharge. Neck: No apparent masses Lungs: Normal respirations with no wheezes, no rhonchi, no rales , no crackles   Heart: Regular rate and rhythm, normal S1 S2, 2+ aortic murmur, no rub, no gallop, PMI is normal size and placement, carotid upstroke normal without bruit, jugular venous pressure normal Abdomen: Soft,  tender, non-distended with normoactive bowel sounds. No hepatosplenomegaly. Abdominal aorta is normal size without bruit Extremities: No edema, no clubbing, no cyanosis, no ulcers,  Peripheral: 2+ radial, 2+ femoral, 2+ dorsal pedal pulses Neuro: Alert and oriented. Moves all extremities spontaneously. Psych:  Responds to questions appropriately with a normal affect.   Intake/Output Summary (Last 24 hours) at 03/09/17 0839 Last data filed at 03/09/17 0500  Gross per 24 hour  Intake              870 ml  Output                0 ml  Net              870 ml    Inpatient Medications:  .  acidophilus  1 capsule Oral BID  . atorvastatin  80 mg Oral Daily  . calcium carbonate  1 tablet Oral Daily  . cholecalciferol  1,000 Units Oral Daily  . diltiazem  180 mg Oral Daily  . famotidine (PEPCID) IV  20 mg Intravenous Q12H  . heparin  5,000 Units Subcutaneous Q8H  . LORazepam  0.5 mg Oral QHS  . mouth rinse  15 mL Mouth Rinse BID  . sertraline  150 mg Oral QHS   Infusions:   Labs:  Recent Labs  03/06/17 1233  03/07/17 0532 03/08/17 0520  NA  --   < > 143 143  K  --   < > 3.6 3.1*  CL  --   < > 112* 107  CO2  --   < > 24 26  GLUCOSE  --   < > 117* 120*  BUN  --   < > <5* 6  CREATININE  --   < > <0.30* 0.53  CALCIUM  --   < > 8.3* 8.5*  MG 1.7  --   --   --   < > = values in this interval not displayed.  Recent Labs  03/06/17 1727  AST 188*  ALT 52  ALKPHOS 57  BILITOT 0.8  PROT 6.5  ALBUMIN 3.2*    Recent Labs  03/07/17 0532 03/08/17 0520  WBC 5.1 6.4  NEUTROABS  --  4.1  HGB 11.7* 12.2  HCT 35.8 37.4  MCV 83.3 81.7  PLT 237 269  No results for input(s): CKTOTAL, CKMB, TROPONINI in the last 72 hours. Invalid input(s): POCBNP No results for input(s): HGBA1C in the last 72 hours.   Weights: Filed Weights   03/06/17 0455 03/07/17 0655 03/09/17 0500  Weight: 106.2 kg (234 lb 1.6 oz) 109.2 kg (240 lb 11.2 oz) 109.4 kg (241 lb 2.9 oz)     Radiology/Studies:  Dg Abd 2 Views  Result Date: 03/08/2017 CLINICAL DATA:  Nausea and vomiting, pain EXAM: ABDOMEN - 2 VIEW COMPARISON:  10/11/2016 FINDINGS: Visualized bowel gas pattern is nonobstructive. Possible thickening of left lower quadrant bowel loops, likely colon. There is no free intraperitoneal air. Patient has had right hip arthroplasty. IMPRESSION: Possible thickening of left lower quadrant bowel loops. Consider further evaluation of the abdomen and pelvis with CT exam with intravenous and oral contrast if symptoms persist. Electronically Signed   By: Nolon Nations M.D.   On: 03/08/2017 08:51    US Abdomen Limited Ruq  Result Date: 03/08/2017 CLINICAL DATA:  Nausea and vomiting. EXAM: US ABDOMEN LIMITED - RIGHT UPPER QUADRANT COMPARISON:  KUB 03/08/2017.  Ultrasound 11/01/2016. FINDINGS: Gallbladder: Multiple tiny gallstones again noted. Largest measures 7 mm Gallbladder wall thickness 2 mm. Negative Murphy sign. Common bile duct: Diameter: 7 mm Liver: There is extremely echodense consistent fatty infiltration and/or hepatocellular disease. No focal hepatic abnormality identified however evaluation for focal lesion would be difficult due to the extreme density of the liver. Exam limited by body habitus. IMPRESSION: 1. Multiple tiny gallstones again noted. No evidence of cholecystitis or biliary distention . 2. Extremely dense liver consistent with fatty infiltration and/or hepatocellular disease. Exam limited by patient's body habitus. Electronically Signed   By: Marcello Moores  Register   On: 03/08/2017 09:17     Assessment and Recommendation  83 y.o. female with known coronary artery disease status post coronary artery bypass graft with aortic stenosis status post bioprosthetic aortic replacement in the past status post pacemaker placement due to sick sinus syndrome having apparent atrial fibrillation with rapid ventricular rate on admission with currently improved heart rate likely secondary to nausea vomiting without evidence of myocardial infarction and/or congestive heart failure 1. Continue diltiazem long acting for further treatment of the rapid heart rate and repeat EKG today to assess for EKG changes atrial fibrillation and further treatment options 2. No further cardiac diagnostics necessary at this time due to no evidence of myocardial infarction or heart failure 3. Further treatment options including the possibility of anticoagulation if patient has atrial fibrillation 4. Continue supportive care of nausea vomiting and abdominal discomfort  Signed, Serafina Royals M.D. FACC

## 2017-03-09 NOTE — Care Management Note (Signed)
Case Management Note  Patient Details  Name: Michele Schultz MRN: [id number] Date of Birth: [birth year]  Subjective/Objective:  From [facility]. Will discharge back today with [facility] following for SN, PT, SW, HHA. They were opening her prior to admission. No other needs                  Action/Plan:   Expected Discharge Date:                  Expected Discharge Plan:  [place]  In-House Referral:     Discharge planning Services  CM Consult  Post Acute Care Choice:  Home Health Choice offered to:     DME Arranged:    DME Agency:     HH Arranged:  PT, RN, Social Work, Nurse's Aide HH Agency:  [facility]  Status of Service:  Completed, signed off  If discussed at [employer], dates discussed:    Additional Comments:  Jolly Mango, RN 03/09/2017, 11:47 AM

## 2017-03-16 ENCOUNTER — Encounter: Payer: Self-pay | Admitting: Emergency Medicine

## 2017-03-16 ENCOUNTER — Emergency Department
Admission: EM | Admit: 2017-03-16 | Discharge: 2017-03-16 | Disposition: A | Discharge status: Home / Self Care | Payer: Medicare Other | Attending: Emergency Medicine | Admitting: Emergency Medicine

## 2017-03-16 DIAGNOSIS — I5032 Chronic diastolic (congestive) heart failure: Secondary | ICD-10-CM | POA: Insufficient documentation

## 2017-03-16 DIAGNOSIS — Z7901 Long term (current) use of anticoagulants: Secondary | ICD-10-CM | POA: Diagnosis not present

## 2017-03-16 DIAGNOSIS — Z951 Presence of aortocoronary bypass graft: Secondary | ICD-10-CM | POA: Diagnosis not present

## 2017-03-16 DIAGNOSIS — K5641 Fecal impaction: Secondary | ICD-10-CM

## 2017-03-16 DIAGNOSIS — I110 Hypertensive heart disease with heart failure: Secondary | ICD-10-CM | POA: Diagnosis not present

## 2017-03-16 DIAGNOSIS — I2510 Atherosclerotic heart disease of native coronary artery without angina pectoris: Secondary | ICD-10-CM | POA: Insufficient documentation

## 2017-03-16 DIAGNOSIS — E119 Type 2 diabetes mellitus without complications: Secondary | ICD-10-CM | POA: Diagnosis not present

## 2017-03-16 DIAGNOSIS — Z7982 Long term (current) use of aspirin: Secondary | ICD-10-CM | POA: Insufficient documentation

## 2017-03-16 DIAGNOSIS — K5900 Constipation, unspecified: Secondary | ICD-10-CM

## 2017-03-16 MED ORDER — DOCUSATE SODIUM 100 MG PO CAPS: 100.0000 mg | ORAL_CAPSULE | Freq: Once | ORAL | Status: DC

## 2017-03-16 MED ORDER — MAGNESIUM CITRATE PO SOLN
0.5000 | Freq: Once | ORAL | Status: AC
  Administered 2017-03-16: 0.5 via ORAL
  Filled 2017-03-16: qty 296

## 2017-03-16 MED ORDER — DOCUSATE SODIUM 50 MG/5ML PO LIQD
100.0000 mg | Freq: Once | ORAL | Status: AC
  Administered 2017-03-16: 100 mg via ORAL
  Filled 2017-03-16: qty 10

## 2017-03-16 MED ORDER — MINERAL OIL RE ENEM
1.0000 | ENEMA | Freq: Once | RECTAL | Status: AC
  Administered 2017-03-16: 1 via RECTAL

## 2017-03-16 MED ORDER — ACETAMINOPHEN 500 MG PO TABS
ORAL_TABLET | ORAL | Status: AC
  Administered 2017-03-16: 1000 mg
  Filled 2017-03-16: qty 2

## 2017-03-16 MED ORDER — POLYETHYLENE GLYCOL 3350 17 GM/SCOOP PO POWD: 1.0000 | Freq: Every day | ORAL | 0 refills | Status: AC

## 2017-03-16 NOTE — ED Notes (Addendum)
Pt reports "I made diarrhea", this RN and Beth, NT cleaned small amount of partially formed stool from pt and installed new diaper.   Pt worried about going home alone.  Pt informed that Beverlee Nims would be taking care of pt at [facility].

## 2017-03-16 NOTE — ED Notes (Signed)
Spoke with Beverlee Nims at [facility]. Gave report.

## 2017-03-16 NOTE — ED Provider Notes (Addendum)
-----------------------------------------   5:32 PM on 03/16/2017 -----------------------------------------  Patient has produced 2 small bowel movements. Patient has attempted both a pink elephant as well as a soapsuds enema, but has only been able to hold in small volumes because she states it starts burning. Patient states she recently was discharged from rehabilitation yesterday and states she is having trouble getting around at home. I have discussed this with our social worker who will be down to speak to the patient and provide resources. At this time I do not believe the patient requires admission to the hospital. She states constipation which is a chronic issue for her. I discussed with the patient a MiraLAX bowel prep at home. Patient states she owns a bedside commode and is able to transfer from bed to the commode and wheelchair by herself. We will have the social worker speak with the patient regarding further home resources if available.   Harvest Dark, MD 03/16/17 1735  ----------------------------------------- 5:41 PM on 03/16/2017 -----------------------------------------  Social worker has seen the patient. We will refer to [facility] clinic for a primary care physician. She has provided resources for the patient. Patient will be discharged home with MiraLAX to be used as needed for constipation. She will follow up with a primary care doctor as well as the resources provided by the social worker.    Harvest Dark, MD 03/16/17 1742  ----------------------------------------- 9:11 PM on 03/16/2017 -----------------------------------------  EMS arrived to take the patient back to her home. Patient states she lives alone and has no neck and check on her. Patient lives in [facility] assisted [facility] facility where there are people that are able to check on her and help her. At this time I do not believe we can do anything else for the patient in the emergency department. I  discussed this with her and she is reluctantly agreeable to go back to [place] but states they don't help her and off.   Harvest Dark, MD 03/16/17 2112

## 2017-03-16 NOTE — Discharge Instructions (Addendum)
Please call the numbers provided to arrange an appointment with a primary care doctor soon as possible. Please follow-up with the resources provided to you by the clinical social worker. As we discussed please use MiraLAX for constipation. Return to the emergency department for any significant abdominal pain, fever, or any other symptom personally concerning to yourself.

## 2017-03-16 NOTE — ED Notes (Signed)
Called pharmacy to send liquid colace

## 2017-03-16 NOTE — Progress Notes (Signed)
LCSW was asked to see patient, she reports she is unable to live on her own. LCSW provided patient with resources for [facility], [facility], discussed having a private CNA, patient was demanding and reporting she was not feeling well. EDP notifed. Elder care resource booklet.  Patient was not willing to listen and LCSW provided support and encouraged patient to continue to push herself to complete her activities of daily living and to encourage her family for additional support.  LCSW heard patient demanding a nurse from her room very loudly and nurse agreed to get their. Patient reported she does not want to be alone and cant do anything. Support again was provided. In consultation patient will return to [facility] as per EDP patient has been medically cleared.  [employer] LCSW [phone]

## 2017-03-16 NOTE — Care Management (Addendum)
This patient is from [place] [phone]  followed by [facility] home health according to [facility] note 03/09/17.  I am checking with [facility] to see if they were called to assist with this complaint while at facility prior to coming to [facility] ED. I spoke with Ms. Leonie Green med tech at [employer] and she is not aware of [facility] being called but will have her lead nurse call this RNCM back with that information.  According to Santiago Glad with [facility] home health, Nanine Means "made them discharge her because they said she was their patient even though she wasn't opened to them"- she was referring to [facility] health services. I have now sent message to Judson Roch with [facility] to check on this status as patient belonged to [facility] home health according to [facility] note (this was patient's choice as many times before). Per Judson Roch- "they offered patient choice (patient has dementia) and patient picked [facility]". I explained that patient had selected [facility] prior to discharge many times (over last two years) from the hospital however if [facility] requests [facility] then that's between them and the patient (considering patient's right to choice).  Judson Roch was not aware if her [facility] had been contacted for impaction/constipation assistance but will check on that and call this RNCM back with report. Lattie Haw nurse from [facility] states that "no ALF will do impaction removal and that home health nursing was not ordered".  I have requested HHRN be added (per written order to ALF care plan order last admission) to Rema Jasmine with [facility]. Sarah cannot take telephone orders however she will have a nurse from [facility] call this RNCM for order and it will be taken care of per Judson Roch.

## 2017-03-16 NOTE — ED Notes (Signed)
This RN Attempted soap suds enema with Helene Kelp RN and Eulis Canner RN student in the room. Explained to patient procedure. Approximately got 120ml in and patient asked for me to stop it was hurting. Patient then wanted me to disimpact her. Explained to patient we are not allowed to do this. Patient was back and forth on whether to try enema again. Patient voiced she wanted to try some more enema. Tried again got approximately 125ml in again. Patient stated she couldn't do anymore that it was right there at the end wanting to come out. Patient then asked, "just give me a glove I will do it myself." explained to patient we cannot do that. Placed patient on bed pan and notified EDP.

## 2017-03-16 NOTE — ED Notes (Signed)
Pt cleaned up by this tech and EDT Beth, pt depends changed as well as linen, pt provided warm blanket and is currently resting at this time

## 2017-03-16 NOTE — ED Triage Notes (Signed)
Pt to ED via EMS from [facility], pt states that she is constipated and needs an enema. Pt was given prune juice and MOM today but has not had a bowel movement in 2 days. Pt c/o generalized abdominal pain.

## 2017-03-16 NOTE — ED Notes (Signed)
Patient called out wanting the doctor. Patient stated, "I drank a little of the juice but I vomited it back up." EDP aware.

## 2017-03-16 NOTE — ED Notes (Signed)
This RN, Terrence Dupont RN, Jan EDT went to administer Soap sud enema with magnesium citrate and colace. This RN explained what was in the enema.  Upon starting enema immediately patient voiced burning and shouted for [place] to stop, "get that thing out of me." Patient stated, "what did you put in me?" This RN explained again what was in enema. Enema administration stopped. EDP notified.

## 2017-03-16 NOTE — ED Provider Notes (Signed)
[facility] Emergency Department Provider Note  ____________________________________________   First MD Initiated Contact with Patient 03/16/17 1409     (approximate)  I have reviewed the triage vital signs and the nursing notes.   HISTORY  Chief Complaint Constipation   HPI Michele Schultz is a 83 y.o. female with history of CHF and intermittent constipation and was presented to emergency department today with constipation. She says that she wanted to give her several Fleet enema at her and dependent living. However, the staff would not allow her to do this herself. She was therefore sent to the emergency department for further evaluation and treatment. She said that usually a Fleet enema will help with her constipation. She says that she feels like she has a large amount of hard stool that is just about to come out. Denies any abdominal pain.   Past Medical History:  Diagnosis Date  . Anemia   . Aortic stenosis, severe    a. s/p Magna Ease pericardial tissue valve size 21 mm replacement in 10/2011 for severe AS 11/12; b. echo 07/2015: EF 55-60% mod concentric LVH, GR1DD, LA mildly dilated, PASP 45 mm Hg  . Atrial tachycardia, paroxysmal (HCC)    a. with rate related LBBB.  . Cardiac pacemaker -st Judes    11/12  . Chronic diastolic heart failure ([place])    a. echo 2014: EF 55-60%, no RWMA, GR1DD, PASP 47 mm Hg; b. echo 07/2015: EF 55-60% mod concentric LVH, GR1DD, LA mildly dilated, PASP 45 mm Hg  . Complete heart block [facility])    a. s/p St Jude PPM 10/2011 (Ser # [id number]).  . Coronary artery disease    a. s/p 2v CABG 11/12 (VG-LAD, VG-OM1); b. Lexiscan 08/2015: low risk, no ischemia, EF 55-65%.  . Enthesopathy of hip region   . Esophageal reflux    followed by Dr.Seigal. stabilized with a combination of Nexium and Zantac  . Fibromyalgia   . HLD (hyperlipidemia)   . Hypertensive heart disease   . Knee joint replacement by other means   . Morbid obesity  ([place])   . Neuralgia, neuritis, and radiculitis, unspecified   . Neuropathy ([place])   . Onychia and paronychia of toe   . Osteoarthrosis, unspecified whether generalized or localized, pelvic region and thigh    mainly in her back and knees  . PAF (paroxysmal atrial fibrillation) ([place])    a. brief episodes of AF previously noted on device interrogations.  Marland Kitchen RAD (reactive airway disease)    a. chronic SOB  . Spinal stenosis   . Stress incontinence, female    followed by Dr.Cope  . Type II or unspecified type diabetes mellitus without mention of complication, not stated as uncontrolled    a. pt. reports that she is borderline   . Wide-complex tachycardia ([place])    a. Noted 4/10 - brief episode in ED. Noted again 4/21 in clinic->felt most likely to be atrial tach.    Patient Active Problem List   Diagnosis Date Noted  . Nausea vomiting and diarrhea 03/05/2017  . Adjustment disorder with depressed mood 11/02/2016  . Nausea & vomiting 11/01/2016  . Nausea and vomiting   . Atrial fibrillation with RVR ([place]) 10/11/2016  . Hypokalemia 10/11/2016  . DNR (do not resuscitate) 09/19/2016  . Palliative care by specialist 09/19/2016  . Muscle weakness (generalized)   . Atrial tachycardia ([place])   . Diarrhea 09/17/2016  . Dehydration 09/17/2016  . Metabolic acidosis 68/07/8109  . Headache  due to trauma 07/03/2016  . Wide-complex tachycardia ([place])   . Pain in the chest   . Emesis   . Weakness of both legs   . Coronary artery disease involving coronary bypass graft of native heart with unspecified angina pectoris   . S/P AVR (aortic valve replacement)   . Falls frequently   . HLD (hyperlipidemia)   . Fibromyalgia   . RAD (reactive airway disease)   . Morbid obesity ([place])   . Aortic stenosis, severe   . Chronic pain 06/17/2014  . Pain in the muscles 06/19/2013  . Chest pain 06/19/2013  . Wheezing 06/19/2013  . Weakness generalized 06/03/2013  . Dyspnea 01/27/2013  . Nausea 03/05/2012  .  Constipation 03/05/2012  . Atrial fibrillation-non sustained 02/09/2012  . Cardiac pacemaker -st Judes   . Chronic diastolic heart failure ([place])   . Coronary artery disease   . Long term (current) use of anticoagulants 01/03/2012  . S/P CABG x 2 11/14/2011  . S/P aortic valve replacement with porcine valve 11/14/2011  . Back pain 11/14/2011  . Complete heart block ([place]) 10/20/2011  . Hypertensive heart disease with CHF (congestive heart failure) ([place]) 09/15/2011  . Spinal stenosis 03/22/2011  . Fatigue 03/22/2011  . Hyperlipidemia 03/22/2011    Past Surgical History:  Procedure Laterality Date  . AORTIC VALVE REPLACEMENT  10/17/2011   Procedure: AORTIC VALVE REPLACEMENT (AVR);  Surgeon: Melrose Nakayama, MD;  Location: [place];  Service: Open Heart Surgery;  Laterality: N/A;  . CARDIAC CATHETERIZATION  08/2009   50% stenosis distal left main, 50% stenosis ostial left circumflex.   Marland Kitchen CARDIAC CATHETERIZATION     at [facility]  . CORONARY ARTERY BYPASS GRAFT  10/17/2011   Procedure: CORONARY ARTERY BYPASS GRAFTING (CABG);  Surgeon: Melrose Nakayama, MD;  Location: [place];  Service: Open Heart Surgery;  Laterality: N/A;  Times Two, using right leg greater saphenous vein harvested endoscopically  . ESOPHAGOGASTRODUODENOSCOPY (EGD) WITH PROPOFOL N/A 10/12/2016   Procedure: ESOPHAGOGASTRODUODENOSCOPY (EGD) WITH PROPOFOL;  Surgeon: Lucilla Lame, MD;  Location: [facility] ENDOSCOPY;  Service: Endoscopy;  Laterality: N/A;  . EYE SURGERY     IOL/ after cataracts removed- bilateral   . NASAL SINUS SURGERY  2009  . PERMANENT PACEMAKER INSERTION N/A 10/20/2011   Procedure: PERMANENT PACEMAKER INSERTION;  Surgeon: Thompson Grayer, MD;  Location: [facility] C[facility] LAB;  Service: Cardiovascular;  Laterality: N/A;  . REPLACEMENT TOTAL KNEE  11/2006   right knee  . TOTAL HIP ARTHROPLASTY  09/2010    Prior to Admission medications   Medication Sig Start Date End Date Taking? Authorizing Provider  acidophilus (RISAQUAD)  CAPS capsule Take 1 capsule by mouth 2 (two) times daily. 06/26/16   Bettey Costa, MD  aluminum-magnesium hydroxide-simethicone (MAALOX) 161-096-04 MG/5ML SUSP Take 30 mLs by mouth every 4 (four) hours as needed.    Historical Provider, MD  aspirin EC 81 MG tablet Take 81 mg by mouth daily.     Historical Provider, MD  atorvastatin (LIPITOR) 80 MG tablet Take 80 mg by mouth daily.    Historical Provider, MD  calcium carbonate (OS-CAL) 600 MG TABS tablet Take 600 mg by mouth daily.    Historical Provider, MD  Cholecalciferol 1000 units capsule Take 1,000 Units by mouth daily.     Historical Provider, MD  diltiazem (CARDIZEM CD) 180 MG 24 hr capsule Take 1 capsule (180 mg total) by mouth daily. 11/06/16   Gladstone Lighter, MD  docusate sodium (COLACE) 100 MG capsule Take 100  mg by mouth daily.    Historical Provider, MD  famotidine (PEPCID) 20 MG tablet Take 1 tablet (20 mg total) by mouth 2 (two) times daily. 03/09/17   Loletha Grayer, MD  furosemide (LASIX) 20 MG tablet Take 1 tablet (20 mg total) by mouth daily. 03/09/17   Loletha Grayer, MD  HYDROcodone-acetaminophen (NORCO) 10-325 MG tablet Take 1 tablet by mouth every 4 (four) hours as needed for moderate pain. Do not exceed 3gm of Tylenol in 24 hours 03/09/17   Loletha Grayer, MD  LORazepam (ATIVAN) 0.5 MG tablet Take 1 tablet (0.5 mg total) by mouth at bedtime. 03/09/17   Loletha Grayer, MD  magnesium hydroxide (MILK OF MAGNESIA) 400 MG/5ML suspension Take 10 mLs by mouth daily.     Historical Provider, MD  magnesium oxide (MAG-OX) 400 MG tablet Take 1 tablet (400 mg total) by mouth daily. 03/09/17   Loletha Grayer, MD  metoCLOPramide (REGLAN) 5 MG tablet Take 1 tablet (5 mg total) by mouth 4 (four) times daily -  before meals and at bedtime. 03/09/17   Loletha Grayer, MD  nitroGLYCERIN (NITROSTAT) 0.4 MG SL tablet Place 1 tablet (0.4 mg total) under the tongue every 5 (five) minutes as needed for chest pain. 01/24/13   Minna Merritts, MD  ondansetron  (ZOFRAN) 8 MG tablet Take 8 mg by mouth every 8 (eight) hours as needed for nausea or vomiting.    Historical Provider, MD  Potassium Chloride ER 20 MEQ TBCR Take 20 mEq by mouth 2 (two) times daily. 03/09/17   Loletha Grayer, MD  promethazine (PHENERGAN) 12.5 MG tablet Take 1 tablet (12.5 mg total) by mouth every 6 (six) hours as needed for nausea. 03/09/17   Loletha Grayer, MD  sertraline (ZOLOFT) 50 MG tablet Take 150 mg by mouth at bedtime.     Historical Provider, MD    Allergies Citalopram; Cymbalta [duloxetine hcl]; Imipramine; Proton pump inhibitors; and Venlafaxine  Family History  Problem Relation Age of Onset  . Heart disease Sister   . Heart disease Brother   . Heart disease Brother   . Heart disease Brother   . Diabetes Other   . Anesthesia problems Neg Hx   . Hypotension Neg Hx   . Malignant hyperthermia Neg Hx   . Pseudochol deficiency Neg Hx     Social History Social History  Substance Use Topics  . Smoking status: Never Smoker  . Smokeless tobacco: Never Used  . Alcohol use No    Review of Systems Constitutional: No fever/chills Eyes: No visual changes. ENT: No sore throat. Cardiovascular: Denies chest pain. Respiratory: Denies shortness of breath. Gastrointestinal: No abdominal pain.  No nausea, no vomiting.  No diarrhea.   Genitourinary: Negative for dysuria. Musculoskeletal: Negative for back pain. Skin: Negative for rash. Neurological: Negative for headaches, focal weakness or numbness.  10-point ROS otherwise negative.  ____________________________________________   PHYSICAL EXAM:  VITAL SIGNS: ED Triage Vitals  Enc Vitals Group     BP 03/16/17 1408 (!) 146/87     Pulse Rate 03/16/17 1408 81     Resp 03/16/17 1408 16     Temp 03/16/17 1408 98.7 F (37.1 C)     Temp Source 03/16/17 1408 Oral     SpO2 03/16/17 1408 100 %     Weight 03/16/17 1405 241 lb (109.3 kg)     Height --      Head Circumference --      Peak Flow --      Pain  Score  03/16/17 1404 5     Pain Loc --      Pain Edu? --      Excl. in [place]? --     Constitutional: Alert and oriented. Appears uncomfortable Eyes: Conjunctivae are normal. PERRL. EOMI. Head: Atraumatic. Nose: No congestion/rhinnorhea. Mouth/Throat: Mucous membranes are moist.   Neck: No stridor.   Cardiovascular: Normal rate, regular rhythm. Grossly normal heart sounds.   Respiratory: Normal respiratory effort.  No retractions. Lungs CTAB. Gastrointestinal: Soft and nontender. No distention.  Musculoskeletal: No lower extremity tenderness nor edema.  No joint effusions. Neurologic:  Normal speech and language. No gross focal neurologic deficits are appreciated.  Skin:  Skin is warm, dry and intact. No rash noted. Psychiatric: Mood and affect are normal. Speech and behavior are normal.  ____________________________________________   LABS (all labs ordered are listed, but only abnormal results are displayed)  Labs Reviewed - No data to display ____________________________________________  EKG   ____________________________________________  RADIOLOGY   ____________________________________________   PROCEDURES  Procedure(s) performed:  ------------------------------------------------------------------------------------------------------------------- Fecal Disimpaction Procedure Note:  Enema was placed but no stool emerged.  However, patient still feeling like she is about to have a large bowel movement and is very uncomfortable.  Performed by me:  Patient placed in the lateral recumbent position with knees drawn towards chest. Nurse present for patient support.  Only able to remove a small to moderate amount of stool because of patient not being able to tolerate the procedure.  ------------------------------------------------------------------------------------------------------------------    Procedures  Critical Care performed:    ____________________________________________   INITIAL IMPRESSION / ASSESSMENT AND PLAN / ED COURSE  Pertinent labs & imaging results that were available during my care of the patient were reviewed by me and considered in my medical decision making (see chart for details).  ----------------------------------------- 3:34 PM on 03/16/2017 -----------------------------------------  After an unsuccessful Fleet enema and only minimally successful disimpaction we will proceed with another enema at the patient's request. We will change the enema contents to help facilitate bowel movement. Signed out to Dr. Tretha Sciara.     ____________________________________________   FINAL CLINICAL IMPRESSION(S) / ED DIAGNOSES  Fecal impaction.    NEW MEDICATIONS STARTED DURING THIS VISIT:  New Prescriptions   No medications on file     Note:  This document was prepared using Dragon voice recognition software and may include unintentional dictation errors.    Orbie Pyo, MD 03/16/17 1536

## 2017-04-26 ENCOUNTER — Other Ambulatory Visit: Payer: Self-pay

## 2017-04-26 MED ORDER — LORAZEPAM 0.5 MG PO TABS: 0.5000 mg | ORAL_TABLET | Freq: Every day | ORAL | 5 refills | Status: DC

## 2017-04-26 NOTE — Telephone Encounter (Signed)
Faxed to [facility] Fax Number: [phone], Phone Number [phone]  

## 2017-06-12 ENCOUNTER — Inpatient Hospital Stay
Admission: EM | Admit: 2017-06-12 | Discharge: 2017-06-25 | DRG: 292 | Disposition: A | Discharge status: Skilled Nursing Facility | Payer: Medicare Other | Attending: Internal Medicine | Admitting: Internal Medicine

## 2017-06-12 ENCOUNTER — Emergency Department: Payer: Medicare Other

## 2017-06-12 DIAGNOSIS — Z96649 Presence of unspecified artificial hip joint: Secondary | ICD-10-CM | POA: Diagnosis present

## 2017-06-12 DIAGNOSIS — I255 Ischemic cardiomyopathy: Secondary | ICD-10-CM | POA: Diagnosis not present

## 2017-06-12 DIAGNOSIS — F329 Major depressive disorder, single episode, unspecified: Secondary | ICD-10-CM | POA: Diagnosis present

## 2017-06-12 DIAGNOSIS — R627 Adult failure to thrive: Secondary | ICD-10-CM | POA: Diagnosis not present

## 2017-06-12 DIAGNOSIS — I5043 Acute on chronic combined systolic (congestive) and diastolic (congestive) heart failure: Secondary | ICD-10-CM | POA: Diagnosis present

## 2017-06-12 DIAGNOSIS — I509 Heart failure, unspecified: Secondary | ICD-10-CM

## 2017-06-12 DIAGNOSIS — Z79899 Other long term (current) drug therapy: Secondary | ICD-10-CM

## 2017-06-12 DIAGNOSIS — I5021 Acute systolic (congestive) heart failure: Secondary | ICD-10-CM | POA: Diagnosis not present

## 2017-06-12 DIAGNOSIS — Z961 Presence of intraocular lens: Secondary | ICD-10-CM | POA: Diagnosis present

## 2017-06-12 DIAGNOSIS — Z952 Presence of prosthetic heart valve: Secondary | ICD-10-CM | POA: Diagnosis not present

## 2017-06-12 DIAGNOSIS — Z9841 Cataract extraction status, right eye: Secondary | ICD-10-CM | POA: Diagnosis not present

## 2017-06-12 DIAGNOSIS — I110 Hypertensive heart disease with heart failure: Principal | ICD-10-CM | POA: Diagnosis present

## 2017-06-12 DIAGNOSIS — Z951 Presence of aortocoronary bypass graft: Secondary | ICD-10-CM | POA: Diagnosis not present

## 2017-06-12 DIAGNOSIS — K219 Gastro-esophageal reflux disease without esophagitis: Secondary | ICD-10-CM | POA: Diagnosis present

## 2017-06-12 DIAGNOSIS — I5041 Acute combined systolic (congestive) and diastolic (congestive) heart failure: Secondary | ICD-10-CM | POA: Diagnosis not present

## 2017-06-12 DIAGNOSIS — I480 Paroxysmal atrial fibrillation: Secondary | ICD-10-CM | POA: Diagnosis present

## 2017-06-12 DIAGNOSIS — Z9842 Cataract extraction status, left eye: Secondary | ICD-10-CM | POA: Diagnosis not present

## 2017-06-12 DIAGNOSIS — Z6838 Body mass index (BMI) 38.0-38.9, adult: Secondary | ICD-10-CM | POA: Diagnosis not present

## 2017-06-12 DIAGNOSIS — I25118 Atherosclerotic heart disease of native coronary artery with other forms of angina pectoris: Secondary | ICD-10-CM | POA: Diagnosis not present

## 2017-06-12 DIAGNOSIS — I25709 Atherosclerosis of coronary artery bypass graft(s), unspecified, with unspecified angina pectoris: Secondary | ICD-10-CM | POA: Diagnosis present

## 2017-06-12 DIAGNOSIS — I442 Atrioventricular block, complete: Secondary | ICD-10-CM | POA: Diagnosis present

## 2017-06-12 DIAGNOSIS — Z66 Do not resuscitate: Secondary | ICD-10-CM | POA: Diagnosis present

## 2017-06-12 DIAGNOSIS — K3184 Gastroparesis: Secondary | ICD-10-CM | POA: Diagnosis present

## 2017-06-12 DIAGNOSIS — I350 Nonrheumatic aortic (valve) stenosis: Secondary | ICD-10-CM | POA: Diagnosis not present

## 2017-06-12 DIAGNOSIS — I952 Hypotension due to drugs: Secondary | ICD-10-CM | POA: Diagnosis not present

## 2017-06-12 DIAGNOSIS — E785 Hyperlipidemia, unspecified: Secondary | ICD-10-CM | POA: Diagnosis present

## 2017-06-12 DIAGNOSIS — Z950 Presence of cardiac pacemaker: Secondary | ICD-10-CM | POA: Diagnosis not present

## 2017-06-12 DIAGNOSIS — E1143 Type 2 diabetes mellitus with diabetic autonomic (poly)neuropathy: Secondary | ICD-10-CM | POA: Diagnosis present

## 2017-06-12 DIAGNOSIS — Z7189 Other specified counseling: Secondary | ICD-10-CM | POA: Diagnosis not present

## 2017-06-12 DIAGNOSIS — I5023 Acute on chronic systolic (congestive) heart failure: Secondary | ICD-10-CM | POA: Diagnosis not present

## 2017-06-12 DIAGNOSIS — E662 Morbid (severe) obesity with alveolar hypoventilation: Secondary | ICD-10-CM | POA: Diagnosis present

## 2017-06-12 DIAGNOSIS — Z96651 Presence of right artificial knee joint: Secondary | ICD-10-CM | POA: Diagnosis present

## 2017-06-12 DIAGNOSIS — R0602 Shortness of breath: Secondary | ICD-10-CM | POA: Diagnosis present

## 2017-06-12 DIAGNOSIS — Z888 Allergy status to other drugs, medicaments and biological substances status: Secondary | ICD-10-CM

## 2017-06-12 DIAGNOSIS — I959 Hypotension, unspecified: Secondary | ICD-10-CM | POA: Diagnosis not present

## 2017-06-12 DIAGNOSIS — Z7982 Long term (current) use of aspirin: Secondary | ICD-10-CM

## 2017-06-12 DIAGNOSIS — I2510 Atherosclerotic heart disease of native coronary artery without angina pectoris: Secondary | ICD-10-CM | POA: Diagnosis present

## 2017-06-12 DIAGNOSIS — Z993 Dependence on wheelchair: Secondary | ICD-10-CM

## 2017-06-12 DIAGNOSIS — Z515 Encounter for palliative care: Secondary | ICD-10-CM | POA: Diagnosis not present

## 2017-06-12 DIAGNOSIS — M797 Fibromyalgia: Secondary | ICD-10-CM | POA: Diagnosis present

## 2017-06-12 DIAGNOSIS — I272 Pulmonary hypertension, unspecified: Secondary | ICD-10-CM | POA: Diagnosis present

## 2017-06-12 DIAGNOSIS — R110 Nausea: Secondary | ICD-10-CM | POA: Diagnosis not present

## 2017-06-12 DIAGNOSIS — I420 Dilated cardiomyopathy: Secondary | ICD-10-CM | POA: Diagnosis present

## 2017-06-12 LAB — CBC
HCT: 34.8 % — ABNORMAL LOW (ref 35.0–47.0)
HEMOGLOBIN: 11.2 g/dL — AB (ref 12.0–16.0)
MCH: 27 pg (ref 26.0–34.0)
MCHC: 32.3 g/dL (ref 32.0–36.0)
MCV: 83.7 fL (ref 80.0–100.0)
Platelets: 295 10*3/uL (ref 150–440)
RBC: 4.16 MIL/uL (ref 3.80–5.20)
RDW: 18.9 % — ABNORMAL HIGH (ref 11.5–14.5)
WBC: 6.5 10*3/uL (ref 3.6–11.0)

## 2017-06-12 LAB — COMPREHENSIVE METABOLIC PANEL
ALBUMIN: 4 g/dL (ref 3.5–5.0)
ALK PHOS: 74 U/L (ref 38–126)
ALT: 17 U/L (ref 14–54)
AST: 26 U/L (ref 15–41)
Anion gap: 8 (ref 5–15)
BILIRUBIN TOTAL: 0.8 mg/dL (ref 0.3–1.2)
BUN: 13 mg/dL (ref 6–20)
CO2: 21 mmol/L — ABNORMAL LOW (ref 22–32)
Calcium: 9.3 mg/dL (ref 8.9–10.3)
Chloride: 111 mmol/L (ref 101–111)
Creatinine, Ser: 0.75 mg/dL (ref 0.44–1.00)
GFR calc Af Amer: >60 mL/min (ref >60)
GFR calc non Af Amer: >60 mL/min (ref >60)
GLUCOSE: 123 mg/dL — AB (ref 65–99)
POTASSIUM: 4.3 mmol/L (ref 3.5–5.1)
Sodium: 140 mmol/L (ref 135–145)
TOTAL PROTEIN: 7.6 g/dL (ref 6.5–8.1)

## 2017-06-12 LAB — BRAIN NATRIURETIC PEPTIDE: B Natriuretic Peptide: 736 pg/mL — ABNORMAL HIGH (ref 0.0–100.0)

## 2017-06-12 LAB — LIPASE, BLOOD: Lipase: 44 U/L (ref 11–51)

## 2017-06-12 LAB — MAGNESIUM: Magnesium: 1.8 mg/dL (ref 1.7–2.4)

## 2017-06-12 LAB — TROPONIN I

## 2017-06-12 MED ORDER — ONDANSETRON HCL 4 MG/2ML IJ SOLN
4.0000 mg | Freq: Once | INTRAMUSCULAR | Status: AC | PRN
  Administered 2017-06-12: 4 mg via INTRAVENOUS
  Filled 2017-06-12: qty 2

## 2017-06-12 MED ORDER — ACETAMINOPHEN 325 MG PO TABS
ORAL_TABLET | ORAL | Status: AC
Start: 2017-06-12 — End: 2017-06-12
  Administered 2017-06-12: 650 mg
  Filled 2017-06-12: qty 2

## 2017-06-12 MED ORDER — FUROSEMIDE 10 MG/ML IJ SOLN
40.0000 mg | Freq: Once | INTRAMUSCULAR | Status: AC
  Administered 2017-06-12: 40 mg via INTRAVENOUS
  Filled 2017-06-12: qty 4

## 2017-06-12 MED ORDER — PROMETHAZINE HCL 25 MG/ML IJ SOLN: 6.2500 mg | Freq: Once | INTRAMUSCULAR | Status: DC

## 2017-06-12 NOTE — ED Notes (Signed)
Per Darrick Penna, RN @ [facility], pt has been experiencing Nausea and decreased oral intake x1 month. Pts last BM on Monday.   [facility] [phone]

## 2017-06-12 NOTE — ED Triage Notes (Signed)
Pt arrived via EMS from [facility]  (for which she's there for rehab). Pt reports that she has had nausea and vomiting for over a month. She vomited this morning. She asked facility to call Ambulance for her. Pt states that she cant keep anything she eats down. VS per EMS BP-127/72 HR-68 O2-94% RA. A list of her medications are by the bedside.

## 2017-06-12 NOTE — ED Provider Notes (Addendum)
[facility] Emergency Department Provider Note  ____________________________________________   I have reviewed the triage vital signs and the nursing notes.   HISTORY  Chief Complaint Nausea and Emesis    HPI Michele Schultz is a 83 y.o. female Patient presents today complaining of nausea for 2 years. She has no discomfort about 2016 suggesting that at that time showed gastroparesis. Patient states that she has had this same nausea every day. She is sure she probably has lost some weight although she does not keep track of her weight. She denies any fever or chills. She is not actually vomiting she is has chronic nausea. She denies abdominal pain. She denies any diarrhea or constipation at this time. Normal bowel movements. She states that she has had a slight cough recently denies any fever or productive cough. She is concerned about her nausea. This is again some of his been going on since 2016. She states that usually been no change in it is just something that she cannot abide any longer.  She states she has had a slight cough over the last few days as well. Nonproductive. States she does feel quite short of breath when she changes position she feels short of breath.    Past Medical History:  Diagnosis Date  . Anemia   . Aortic stenosis, severe    a. s/p Magna Ease pericardial tissue valve size 21 mm replacement in 10/2011 for severe AS 11/12; b. echo 07/2015: EF 55-60% mod concentric LVH, GR1DD, LA mildly dilated, PASP 45 mm Hg  . Atrial tachycardia, paroxysmal (HCC)    a. with rate related LBBB.  . Cardiac pacemaker -st Judes    11/12  . Chronic diastolic heart failure ([place])    a. echo 2014: EF 55-60%, no RWMA, GR1DD, PASP 47 mm Hg; b. echo 07/2015: EF 55-60% mod concentric LVH, GR1DD, LA mildly dilated, PASP 45 mm Hg  . Complete heart block [facility])    a. s/p St Jude PPM 10/2011 (Ser # [id number]).  . Coronary artery disease    a. s/p 2v CABG 11/12 (VG-LAD,  VG-OM1); b. Lexiscan 08/2015: low risk, no ischemia, EF 55-65%.  . Enthesopathy of hip region   . Esophageal reflux    followed by Dr.Seigal. stabilized with a combination of Nexium and Zantac  . Fibromyalgia   . HLD (hyperlipidemia)   . Hypertensive heart disease   . Knee joint replacement by other means   . Morbid obesity ([place])   . Neuralgia, neuritis, and radiculitis, unspecified   . Neuropathy   . Onychia and paronychia of toe   . Osteoarthrosis, unspecified whether generalized or localized, pelvic region and thigh    mainly in her back and knees  . PAF (paroxysmal atrial fibrillation) ([place])    a. brief episodes of AF previously noted on device interrogations.  Marland Kitchen RAD (reactive airway disease)    a. chronic SOB  . Spinal stenosis   . Stress incontinence, female    followed by Dr.Cope  . Type II or unspecified type diabetes mellitus without mention of complication, not stated as uncontrolled    a. pt. reports that she is borderline   . Wide-complex tachycardia ([place])    a. Noted 4/10 - brief episode in ED. Noted again 4/21 in clinic->felt most likely to be atrial tach.    Patient Active Problem List   Diagnosis Date Noted  . Nausea vomiting and diarrhea 03/05/2017  . Adjustment disorder with depressed mood 11/02/2016  . Nausea &  vomiting 11/01/2016  . Nausea and vomiting   . Atrial fibrillation with RVR ([place]) 10/11/2016  . Hypokalemia 10/11/2016  . DNR (do not resuscitate) 09/19/2016  . Palliative care by specialist 09/19/2016  . Muscle weakness (generalized)   . Atrial tachycardia ([place])   . Diarrhea 09/17/2016  . Dehydration 09/17/2016  . Metabolic acidosis 64/33/2951  . Headache due to trauma 07/03/2016  . Wide-complex tachycardia ([place])   . Pain in the chest   . Emesis   . Weakness of both legs   . Coronary artery disease involving coronary bypass graft of native heart with unspecified angina pectoris   . S/P AVR (aortic valve replacement)   . Falls frequently   .  HLD (hyperlipidemia)   . Fibromyalgia   . RAD (reactive airway disease)   . Morbid obesity ([place])   . Aortic stenosis, severe   . Chronic pain 06/17/2014  . Pain in the muscles 06/19/2013  . Chest pain 06/19/2013  . Wheezing 06/19/2013  . Weakness generalized 06/03/2013  . Dyspnea 01/27/2013  . Nausea 03/05/2012  . Constipation 03/05/2012  . Atrial fibrillation-non sustained 02/09/2012  . Cardiac pacemaker -st Judes   . Chronic diastolic heart failure ([place])   . Coronary artery disease   . Long term (current) use of anticoagulants 01/03/2012  . S/P CABG x 2 11/14/2011  . S/P aortic valve replacement with porcine valve 11/14/2011  . Back pain 11/14/2011  . Complete heart block ([place]) 10/20/2011  . Hypertensive heart disease with CHF (congestive heart failure) ([place]) 09/15/2011  . Spinal stenosis 03/22/2011  . Fatigue 03/22/2011  . Hyperlipidemia 03/22/2011    Past Surgical History:  Procedure Laterality Date  . AORTIC VALVE REPLACEMENT  10/17/2011   Procedure: AORTIC VALVE REPLACEMENT (AVR);  Surgeon: Melrose Nakayama, MD;  Location: [place];  Service: Open Heart Surgery;  Laterality: N/A;  . CARDIAC CATHETERIZATION  08/2009   50% stenosis distal left main, 50% stenosis ostial left circumflex.   Marland Kitchen CARDIAC CATHETERIZATION     [facility]  . CORONARY ARTERY BYPASS GRAFT  10/17/2011   Procedure: CORONARY ARTERY BYPASS GRAFTING (CABG);  Surgeon: Melrose Nakayama, MD;  Location: [place];  Service: Open Heart Surgery;  Laterality: N/A;  Times Two, using right leg greater saphenous vein harvested endoscopically  . ESOPHAGOGASTRODUODENOSCOPY (EGD) WITH PROPOFOL N/A 10/12/2016   Procedure: ESOPHAGOGASTRODUODENOSCOPY (EGD) WITH PROPOFOL;  Surgeon: Lucilla Lame, MD;  Location: [facility] ENDOSCOPY;  Service: Endoscopy;  Laterality: N/A;  . EYE SURGERY     IOL/ after cataracts removed- bilateral   . NASAL SINUS SURGERY  2009  . PERMANENT PACEMAKER INSERTION N/A 10/20/2011   Procedure: PERMANENT  PACEMAKER INSERTION;  Surgeon: Thompson Grayer, MD;  Location: [facility] CATH LAB;  Service: Cardiovascular;  Laterality: N/A;  . REPLACEMENT TOTAL KNEE  11/2006   right knee  . TOTAL HIP ARTHROPLASTY  09/2010    Prior to Admission medications   Medication Sig Start Date End Date Taking? Authorizing Provider  acidophilus (RISAQUAD) CAPS capsule Take 1 capsule by mouth 2 (two) times daily. 06/26/16   Bettey Costa, MD  aluminum-magnesium hydroxide-simethicone (MAALOX) 884-166-06 MG/5ML SUSP Take 30 mLs by mouth every 4 (four) hours as needed.    [provider]  aspirin EC 81 MG tablet Take 81 mg by mouth daily.     [provider]  atorvastatin (LIPITOR) 80 MG tablet Take 80 mg by mouth daily.    [provider]  calcium carbonate (OS-CAL) 600 MG TABS tablet Take  600 mg by mouth daily.    [provider]  Cholecalciferol 1000 units capsule Take 1,000 Units by mouth daily.     [provider]  diltiazem (CARDIZEM CD) 180 MG 24 hr capsule Take 1 capsule (180 mg total) by mouth daily. 11/06/16   Gladstone Lighter, MD  docusate sodium (COLACE) 100 MG capsule Take 100 mg by mouth daily.    [provider]  famotidine (PEPCID) 20 MG tablet Take 1 tablet (20 mg total) by mouth 2 (two) times daily. 03/09/17   Loletha Grayer, MD  furosemide (LASIX) 20 MG tablet Take 1 tablet (20 mg total) by mouth daily. 03/09/17   Loletha Grayer, MD  HYDROcodone-acetaminophen (NORCO) 10-325 MG tablet Take 1 tablet by mouth every 4 (four) hours as needed for moderate pain. Do not exceed 3gm of Tylenol in 24 hours 03/09/17   Loletha Grayer, MD  LORazepam (ATIVAN) 0.5 MG tablet Take 1 tablet (0.5 mg total) by mouth at bedtime. 04/26/17   Reed, Tiffany L, DO  magnesium hydroxide (MILK OF MAGNESIA) 400 MG/5ML suspension Take 10 mLs by mouth daily.     [provider]  magnesium oxide (MAG-OX) 400 MG tablet Take 1 tablet (400 mg total) by mouth daily. 03/09/17   Loletha Grayer,  MD  metoCLOPramide (REGLAN) 5 MG tablet Take 1 tablet (5 mg total) by mouth 4 (four) times daily -  before meals and at bedtime. 03/09/17   Loletha Grayer, MD  nitroGLYCERIN (NITROSTAT) 0.4 MG SL tablet Place 1 tablet (0.4 mg total) under the tongue every 5 (five) minutes as needed for chest pain. 01/24/13   Minna Merritts, MD  ondansetron (ZOFRAN) 8 MG tablet Take 8 mg by mouth every 8 (eight) hours as needed for nausea or vomiting.    [provider]  polyethylene glycol powder (GLYCOLAX/MIRALAX) powder Take 255 g by mouth daily. Mix entire contents of miralax (255g) with 64 oz of gatorade in a pitcher.   Drink 8oz of mixture every 30 minutes until you have multiple loose bowel movements. 03/16/17   Harvest Dark, MD  Potassium Chloride ER 20 MEQ TBCR Take 20 mEq by mouth 2 (two) times daily. 03/09/17   Loletha Grayer, MD  promethazine (PHENERGAN) 12.5 MG tablet Take 1 tablet (12.5 mg total) by mouth every 6 (six) hours as needed for nausea. 03/09/17   Loletha Grayer, MD  sertraline (ZOLOFT) 50 MG tablet Take 150 mg by mouth at bedtime.     [provider]    Allergies Citalopram; Cymbalta [duloxetine hcl]; Imipramine; Proton pump inhibitors; and Venlafaxine  Family History  Problem Relation Age of Onset  . Heart disease Sister   . Heart disease Brother   . Heart disease Brother   . Heart disease Brother   . Diabetes Other   . Anesthesia problems Neg Hx   . Hypotension Neg Hx   . Malignant hyperthermia Neg Hx   . Pseudochol deficiency Neg Hx     Social History Social History  Substance Use Topics  . Smoking status: Never Smoker  . Smokeless tobacco: Never Used  . Alcohol use No    Review of Systems Constitutional: No fever/chills Eyes: No visual changes. ENT: No sore throat. No stiff neck no neck pain Cardiovascular: Denies chest pain. Respiratory: Positive shortness of breath. Gastrointestinal:   no vomiting.  No diarrhea.  No  constipation. Genitourinary: Negative for dysuria. Musculoskeletal: Negative lower extremity swelling Skin: Negative for rash. Neurological: Negative for severe headaches, focal weakness or  numbness.   ____________________________________________   PHYSICAL EXAM:  VITAL SIGNS: ED Triage Vitals  Enc Vitals Group     BP 06/12/17 2036 129/75     Pulse Rate 06/12/17 2036 85     Resp 06/12/17 2036 17     Temp 06/12/17 2036 97.6 F (36.4 C)     Temp Source 06/12/17 2036 Oral     SpO2 06/12/17 2036 99 %     Weight 06/12/17 2037 190 lb (86.2 kg)     Height 06/12/17 2037 5\' 4"  (1.626 m)     Head Circumference --      Peak Flow --      Pain Score 06/12/17 2034 8     Pain Loc --      Pain Edu? --      Excl. in [place]? --     Constitutional: Alert and oriented. Well appearing and in no acute distress.She does appear somewhat dyspneic, and when she changes position that she gets short of breath Eyes: Conjunctivae are normal Head: Atraumatic HEENT: No congestion/rhinnorhea. Mucous membranes are moist.  Oropharynx non-erythematous Neck:   Nontender with no meningismus, no masses, no stridor Cardiovascular: Normal rate, regular rhythm. Grossly normal heart sounds.  Good peripheral circulation. Respiratory: Increased work of breathing noted, no retractions, diminished in the bases Abdominal: Soft and nontender. No distention. No guarding no rebound Back:  There is no focal tenderness or step off.  there is no midline tenderness there are no lesions noted. there is no CVA tenderness Musculoskeletal: No lower extremity tenderness, no upper extremity tenderness. No joint effusions, no DVT signs strong distal pulses mild symmetric bilateral edema Neurologic:  Normal speech and language. No gross focal neurologic deficits are appreciated.  Skin:  Skin is warm, dry and intact. No rash noted. Psychiatric: Mood and affect are normal. Speech and behavior are  normal.  ____________________________________________   LABS (all labs ordered are listed, but only abnormal results are displayed)  Labs Reviewed  COMPREHENSIVE METABOLIC PANEL - Abnormal; Notable for the following:       Result Value   CO2 21 (*)    Glucose, Bld 123 (*)    All other components within normal limits  CBC - Abnormal; Notable for the following:    Hemoglobin 11.2 (*)    HCT 34.8 (*)    RDW 18.9 (*)    All other components within normal limits  LIPASE, BLOOD  MAGNESIUM  TROPONIN I  URINALYSIS, COMPLETE (UACMP) WITH MICROSCOPIC  BRAIN NATRIURETIC PEPTIDE   ____________________________________________  EKG  I personally interpreted any EKGs ordered by me or triage AV paced rhythm, rate 96 ____________________________________________  RADIOLOGY  I reviewed any imaging ordered by me or triage that were performed during my shift and, if possible, patient and/or family made aware of any abnormal findings. ____________________________________________   PROCEDURES  Procedure(s) performed: None  Procedures  Critical Care performed: None  ____________________________________________   INITIAL IMPRESSION / ASSESSMENT AND PLAN / ED COURSE  Pertinent labs & imaging results that were available during my care of the patient were reviewed by me and considered in my medical decision making (see chart for details).  Patient is here with chronic nausea, she is also however complaining of some degree of shortness of breath. She was tachypnea when she first came in when she changes position in the bed she gets to keep headache, her sats are good she is diminished in the bases her BNP is elevated. Clinically she does appear to have  some degree of CHF I wonder if this might be why she is here with his chronic nausea. Abdomen is benign. We will give her IV Lasix and I feel given her age and the fact that she seems to get winded just moving that that would not be  unreasonable to observe her.    ____________________________________________   FINAL CLINICAL IMPRESSION(S) / ED DIAGNOSES  Final diagnoses:  None      This chart was dictated using voice recognition software.  Despite best efforts to proofread,  errors can occur which can change meaning.      Schuyler Amor, MD 06/12/17 2314    Schuyler Amor, MD 06/12/17 [phone]

## 2017-06-13 ENCOUNTER — Inpatient Hospital Stay
Admit: 2017-06-13 | Discharge: 2017-06-13 | Disposition: A | Discharge status: Home / Self Care | Payer: Medicare Other | Attending: Internal Medicine | Admitting: Internal Medicine

## 2017-06-13 DIAGNOSIS — R110 Nausea: Secondary | ICD-10-CM

## 2017-06-13 DIAGNOSIS — Z951 Presence of aortocoronary bypass graft: Secondary | ICD-10-CM

## 2017-06-13 DIAGNOSIS — I25118 Atherosclerotic heart disease of native coronary artery with other forms of angina pectoris: Secondary | ICD-10-CM

## 2017-06-13 DIAGNOSIS — R0602 Shortness of breath: Secondary | ICD-10-CM

## 2017-06-13 DIAGNOSIS — Z952 Presence of prosthetic heart valve: Secondary | ICD-10-CM

## 2017-06-13 DIAGNOSIS — I5043 Acute on chronic combined systolic (congestive) and diastolic (congestive) heart failure: Secondary | ICD-10-CM

## 2017-06-13 LAB — BASIC METABOLIC PANEL
ANION GAP: 9 (ref 5–15)
BUN: 12 mg/dL (ref 6–20)
CALCIUM: 9.2 mg/dL (ref 8.9–10.3)
CO2: 22 mmol/L (ref 22–32)
CREATININE: 0.65 mg/dL (ref 0.44–1.00)
Chloride: 107 mmol/L (ref 101–111)
GFR calc non Af Amer: >60 mL/min (ref >60)
Glucose, Bld: 116 mg/dL — ABNORMAL HIGH (ref 65–99)
Potassium: 3.8 mmol/L (ref 3.5–5.1)
SODIUM: 138 mmol/L (ref 135–145)

## 2017-06-13 LAB — URINALYSIS, COMPLETE (UACMP) WITH MICROSCOPIC
Bilirubin Urine: NEGATIVE
Glucose, UA: NEGATIVE mg/dL
Hgb urine dipstick: NEGATIVE
Ketones, ur: NEGATIVE mg/dL
LEUKOCYTES UA: NEGATIVE
NITRITE: NEGATIVE
PH: 5 (ref 5.0–8.0)
Protein, ur: NEGATIVE mg/dL
RBC / HPF: NONE SEEN RBC/hpf (ref 0–5)
SPECIFIC GRAVITY, URINE: 1.003 — AB (ref 1.005–1.030)
SQUAMOUS EPITHELIAL / LPF: NONE SEEN

## 2017-06-13 LAB — CBC
HCT: 32.7 % — ABNORMAL LOW (ref 35.0–47.0)
HEMOGLOBIN: 10.7 g/dL — AB (ref 12.0–16.0)
MCH: 27.2 pg (ref 26.0–34.0)
MCHC: 32.8 g/dL (ref 32.0–36.0)
MCV: 83.2 fL (ref 80.0–100.0)
PLATELETS: 292 10*3/uL (ref 150–440)
RBC: 3.93 MIL/uL (ref 3.80–5.20)
RDW: 18.6 % — ABNORMAL HIGH (ref 11.5–14.5)
WBC: 6 10*3/uL (ref 3.6–11.0)

## 2017-06-13 LAB — MRSA PCR SCREENING: MRSA by PCR: NEGATIVE

## 2017-06-13 LAB — ECHOCARDIOGRAM COMPLETE
HEIGHTINCHES: 64 in
WEIGHTICAEL: 3539.2 [oz_av]

## 2017-06-13 LAB — TROPONIN I

## 2017-06-13 MED ORDER — POLYETHYLENE GLYCOL 3350 17 GM/SCOOP PO POWD
1.0000 | Freq: Every day | ORAL | Status: DC | PRN
  Filled 2017-06-13: qty 255

## 2017-06-13 MED ORDER — ONDANSETRON HCL 4 MG PO TABS: 8.0000 mg | ORAL_TABLET | Freq: Three times a day (TID) | ORAL | Status: DC | PRN

## 2017-06-13 MED ORDER — LISINOPRIL 5 MG PO TABS
5.0000 mg | ORAL_TABLET | Freq: Every day | ORAL | Status: DC
  Administered 2017-06-13: 5 mg via ORAL
  Filled 2017-06-13: qty 1

## 2017-06-13 MED ORDER — LAMOTRIGINE 25 MG PO TABS
50.0000 mg | ORAL_TABLET | Freq: Two times a day (BID) | ORAL | Status: DC
  Administered 2017-06-13 – 2017-06-25 (×24): 50 mg via ORAL
  Filled 2017-06-13 (×24): qty 2

## 2017-06-13 MED ORDER — GABAPENTIN 100 MG PO CAPS
100.0000 mg | ORAL_CAPSULE | Freq: Every day | ORAL | Status: DC
  Administered 2017-06-13 – 2017-06-24 (×12): 100 mg via ORAL
  Filled 2017-06-13 (×12): qty 1

## 2017-06-13 MED ORDER — QUETIAPINE FUMARATE 25 MG PO TABS
25.0000 mg | ORAL_TABLET | Freq: Two times a day (BID) | ORAL | Status: DC
  Administered 2017-06-13 – 2017-06-25 (×24): 25 mg via ORAL
  Filled 2017-06-13 (×24): qty 1

## 2017-06-13 MED ORDER — FAMOTIDINE 20 MG PO TABS
20.0000 mg | ORAL_TABLET | Freq: Two times a day (BID) | ORAL | Status: DC
  Administered 2017-06-13 – 2017-06-25 (×25): 20 mg via ORAL
  Filled 2017-06-13 (×25): qty 1

## 2017-06-13 MED ORDER — DILTIAZEM HCL ER COATED BEADS 180 MG PO CP24
180.0000 mg | ORAL_CAPSULE | Freq: Every day | ORAL | Status: DC
  Administered 2017-06-13: 180 mg via ORAL
  Filled 2017-06-13: qty 1

## 2017-06-13 MED ORDER — MAGNESIUM HYDROXIDE 400 MG/5ML PO SUSP
10.0000 mL | Freq: Every day | ORAL | Status: DC
  Administered 2017-06-13 – 2017-06-24 (×9): 10 mL via ORAL
  Filled 2017-06-13 (×12): qty 30

## 2017-06-13 MED ORDER — SODIUM CHLORIDE 0.9% FLUSH
3.0000 mL | Freq: Two times a day (BID) | INTRAVENOUS | Status: DC
  Administered 2017-06-13 – 2017-06-25 (×22): 3 mL via INTRAVENOUS

## 2017-06-13 MED ORDER — PROMETHAZINE HCL 25 MG PO TABS
12.5000 mg | ORAL_TABLET | Freq: Four times a day (QID) | ORAL | Status: DC | PRN
  Administered 2017-06-13: 12.5 mg via ORAL
  Filled 2017-06-13 (×2): qty 1

## 2017-06-13 MED ORDER — ONDANSETRON HCL 4 MG/2ML IJ SOLN
4.0000 mg | Freq: Four times a day (QID) | INTRAMUSCULAR | Status: DC | PRN
  Administered 2017-06-13 – 2017-06-15 (×2): 4 mg via INTRAVENOUS
  Filled 2017-06-13: qty 2

## 2017-06-13 MED ORDER — FUROSEMIDE 10 MG/ML IJ SOLN
40.0000 mg | Freq: Two times a day (BID) | INTRAMUSCULAR | Status: DC
  Administered 2017-06-13 – 2017-06-14 (×3): 40 mg via INTRAVENOUS
  Filled 2017-06-13 (×4): qty 4

## 2017-06-13 MED ORDER — NITROGLYCERIN 0.4 MG SL SUBL: 0.4000 mg | SUBLINGUAL_TABLET | SUBLINGUAL | Status: DC | PRN

## 2017-06-13 MED ORDER — SODIUM CHLORIDE 0.9% FLUSH
3.0000 mL | INTRAVENOUS | Status: DC | PRN
  Administered 2017-06-13: 3 mL via INTRAVENOUS
  Filled 2017-06-13: qty 3

## 2017-06-13 MED ORDER — SODIUM CHLORIDE 0.9 % IV SOLN: 250.0000 mL | INTRAVENOUS | Status: DC | PRN

## 2017-06-13 MED ORDER — ENOXAPARIN SODIUM 40 MG/0.4ML ~~LOC~~ SOLN
40.0000 mg | SUBCUTANEOUS | Status: DC
  Administered 2017-06-13 – 2017-06-24 (×12): 40 mg via SUBCUTANEOUS
  Filled 2017-06-13 (×12): qty 0.4

## 2017-06-13 MED ORDER — HYDROCODONE-ACETAMINOPHEN 10-325 MG PO TABS
1.0000 | ORAL_TABLET | ORAL | Status: DC | PRN
  Administered 2017-06-13 (×3): 1 via ORAL
  Filled 2017-06-13 (×5): qty 1

## 2017-06-13 MED ORDER — SERTRALINE HCL 50 MG PO TABS
150.0000 mg | ORAL_TABLET | Freq: Every day | ORAL | Status: DC
  Administered 2017-06-13 – 2017-06-24 (×12): 150 mg via ORAL
  Filled 2017-06-13 (×12): qty 3

## 2017-06-13 MED ORDER — LORAZEPAM 0.5 MG PO TABS
0.5000 mg | ORAL_TABLET | Freq: Every day | ORAL | Status: DC
  Administered 2017-06-13 – 2017-06-24 (×12): 0.5 mg via ORAL
  Filled 2017-06-13 (×12): qty 1

## 2017-06-13 MED ORDER — DOCUSATE SODIUM 100 MG PO CAPS
100.0000 mg | ORAL_CAPSULE | Freq: Every day | ORAL | Status: DC
  Administered 2017-06-13 – 2017-06-16 (×4): 100 mg via ORAL
  Filled 2017-06-13 (×4): qty 1

## 2017-06-13 MED ORDER — ATORVASTATIN CALCIUM 20 MG PO TABS
80.0000 mg | ORAL_TABLET | Freq: Every day | ORAL | Status: DC
  Administered 2017-06-13 – 2017-06-25 (×13): 80 mg via ORAL
  Filled 2017-06-13 (×13): qty 4

## 2017-06-13 MED ORDER — POLYETHYLENE GLYCOL 3350 17 GM/SCOOP PO POWD: 1.0000 | Freq: Every day | ORAL | Status: DC

## 2017-06-13 MED ORDER — CALCIUM CARBONATE ANTACID 500 MG PO CHEW
1.0000 | CHEWABLE_TABLET | Freq: Every day | ORAL | Status: DC
  Administered 2017-06-13 – 2017-06-25 (×13): 200 mg via ORAL
  Filled 2017-06-13 (×13): qty 1

## 2017-06-13 MED ORDER — VITAMIN D 1000 UNITS PO TABS
1000.0000 [IU] | ORAL_TABLET | Freq: Every day | ORAL | Status: DC
  Administered 2017-06-13 – 2017-06-25 (×13): 1000 [IU] via ORAL
  Filled 2017-06-13 (×13): qty 1

## 2017-06-13 MED ORDER — METOPROLOL SUCCINATE ER 50 MG PO TB24
50.0000 mg | ORAL_TABLET | Freq: Every day | ORAL | Status: DC
  Administered 2017-06-14 – 2017-06-15 (×2): 50 mg via ORAL
  Filled 2017-06-13 (×3): qty 1

## 2017-06-13 MED ORDER — ACETAMINOPHEN 325 MG PO TABS
650.0000 mg | ORAL_TABLET | ORAL | Status: DC | PRN
  Administered 2017-06-14 – 2017-06-25 (×14): 650 mg via ORAL
  Filled 2017-06-13 (×16): qty 2

## 2017-06-13 MED ORDER — POTASSIUM CHLORIDE CRYS ER 20 MEQ PO TBCR
20.0000 meq | EXTENDED_RELEASE_TABLET | Freq: Two times a day (BID) | ORAL | Status: DC
  Administered 2017-06-13 – 2017-06-25 (×25): 20 meq via ORAL
  Filled 2017-06-13 (×25): qty 1

## 2017-06-13 MED ORDER — CALCIUM CARBONATE 600 MG PO TABS: 600.0000 mg | ORAL_TABLET | Freq: Every day | ORAL | Status: DC

## 2017-06-13 MED ORDER — ASPIRIN EC 81 MG PO TBEC
81.0000 mg | DELAYED_RELEASE_TABLET | Freq: Every day | ORAL | Status: DC
  Administered 2017-06-13 – 2017-06-25 (×13): 81 mg via ORAL
  Filled 2017-06-13 (×13): qty 1

## 2017-06-13 MED ORDER — IPRATROPIUM-ALBUTEROL 0.5-2.5 (3) MG/3ML IN SOLN
3.0000 mL | Freq: Four times a day (QID) | RESPIRATORY_TRACT | Status: DC | PRN
  Administered 2017-06-13: 3 mL via RESPIRATORY_TRACT
  Filled 2017-06-13: qty 3

## 2017-06-13 MED ORDER — SACUBITRIL-VALSARTAN 24-26 MG PO TABS
1.0000 | ORAL_TABLET | Freq: Two times a day (BID) | ORAL | Status: DC
  Administered 2017-06-15 – 2017-06-17 (×4): 1 via ORAL
  Filled 2017-06-13 (×4): qty 1

## 2017-06-13 MED ORDER — METOCLOPRAMIDE HCL 5 MG PO TABS
5.0000 mg | ORAL_TABLET | Freq: Three times a day (TID) | ORAL | Status: DC
  Administered 2017-06-13 – 2017-06-25 (×51): 5 mg via ORAL
  Filled 2017-06-13 (×50): qty 1

## 2017-06-13 MED ORDER — MAGNESIUM OXIDE 400 (241.3 MG) MG PO TABS
400.0000 mg | ORAL_TABLET | Freq: Every day | ORAL | Status: DC
  Administered 2017-06-13 – 2017-06-25 (×13): 400 mg via ORAL
  Filled 2017-06-13 (×13): qty 1

## 2017-06-13 MED ORDER — RISAQUAD PO CAPS
1.0000 | ORAL_CAPSULE | Freq: Two times a day (BID) | ORAL | Status: DC
  Administered 2017-06-13 – 2017-06-25 (×25): 1 via ORAL
  Filled 2017-06-13 (×25): qty 1

## 2017-06-13 NOTE — Progress Notes (Signed)
*  PRELIMINARY RESULTS* Echocardiogram 2D Echocardiogram has been performed.  Sherrie Sport 06/13/2017, 10:15 AM

## 2017-06-13 NOTE — Clinical Social Work Note (Signed)
CSW received consult that patient is a long term care resident at [facility].  Plan to return back to SNF once she is medically ready for discharge and orders have been received.  Jones Broom. [place], MSW, [place]  06/13/2017 1:36 PM

## 2017-06-13 NOTE — Progress Notes (Signed)
Pt complains of shortness of breath, oxygen sat is 100% on RA, lungs show exp wheeze to ascultation. MD notified. Orders for duonebs every 6 hours prn received. I will continue to assess.

## 2017-06-13 NOTE — Plan of Care (Signed)
Problem: Education: Goal: Ability to demonstrate managment of disease process will improve Outcome: Not Progressing Self management skills of low salt diet, daily weights and fluid restrictions reviewed

## 2017-06-13 NOTE — Progress Notes (Signed)
[place] at [place] NAME: Michele Schultz    MR#:  [id number]  DATE OF BIRTH:  1931-10-25  SUBJECTIVE:  CHIEF COMPLAINT:   Chief Complaint  Patient presents with  . Nausea  . Emesis   Came with SOB. Have significant drop in EF%  REVIEW OF SYSTEMS:   CONSTITUTIONAL: No fever, fatigue or weakness.  EYES: No blurred or double vision.  EARS, NOSE, AND THROAT: No tinnitus or ear pain.  RESPIRATORY: Has occasional cough, shortness of breath, No wheezing or hemoptysis.  CARDIOVASCULAR: No chest pain,  has orthopnea, edema.  GASTROINTESTINAL: No nausea, vomiting, diarrhea or abdominal pain.  GENITOURINARY: No dysuria, hematuria.  ENDOCRINE: No polyuria, nocturia,  HEMATOLOGY: No anemia, easy bruising or bleeding SKIN: No rash or lesion. MUSCULOSKELETAL: No joint pain or arthritis.   NEUROLOGIC: No tingling, numbness, weakness.  PSYCHIATRY: No anxiety or depression.   ROS  DRUG ALLERGIES:   Allergies  Allergen Reactions  . Citalopram Other (See Comments)    Reaction:  Altered mental status   . Cymbalta [Duloxetine Hcl] Other (See Comments)    Reaction:  Sedative for pt   . Imipramine Other (See Comments)    Reaction:  Unknown   . Proton Pump Inhibitors Other (See Comments)    Reaction:  Unknown   . Venlafaxine Nausea And Vomiting and Other (See Comments)    Reaction:  Dizziness     VITALS:  Blood pressure 95/61, pulse 78, temperature 98.4 F (36.9 C), temperature source Oral, resp. rate 16, height 5\' 4"  (1.626 m), weight 100.3 kg (221 lb 3.2 oz), SpO2 95 %.  PHYSICAL EXAMINATION:   GENERAL:  83 y.o.-year-old patient lying in the bed with no acute distress.  EYES: Pupils equal, round, reactive to light and accommodation. No scleral icterus. Extraocular muscles intact.  HEENT: Head atraumatic, normocephalic. Oropharynx and nasopharynx clear.  NECK:  Supple, no jugular venous distention. No thyroid enlargement, no tenderness.   LUNGS: Decreased breath sounds bilaterally, bibasilar crepitations heard. No use of accessory muscles of respiration.  CARDIOVASCULAR: S1, S2 normal. No murmurs, rubs, or gallops.  ABDOMEN: Soft, nontender, nondistended. Bowel sounds present. No organomegaly or mass.  EXTREMITIES: Has pedal edema, no cyanosis, or clubbing.  NEUROLOGIC: Cranial nerves II through XII are intact. Muscle strength 5/5 in all extremities. Sensation intact. Gait not checked.  PSYCHIATRIC: The patient is alert and oriented x 3.  SKIN: No obvious rash, lesion, or ulcer.   Physical Exam LABORATORY PANEL:   CBC  Recent Labs Lab 06/13/17 0130  WBC 6.0  HGB 10.7*  HCT 32.7*  PLT 292   ------------------------------------------------------------------------------------------------------------------  Chemistries   Recent Labs Lab 06/12/17 2045 06/13/17 0130  NA 140 138  K 4.3 3.8  CL 111 107  CO2 21* 22  GLUCOSE 123* 116*  BUN 13 12  CREATININE 0.75 0.65  CALCIUM 9.3 9.2  MG 1.8  --   AST 26  --   ALT 17  --   ALKPHOS 74  --   BILITOT 0.8  --    ------------------------------------------------------------------------------------------------------------------  Cardiac Enzymes  Recent Labs Lab 06/13/17 0725 06/13/17 1330  TROPONINI <0.03 <0.03   ------------------------------------------------------------------------------------------------------------------  RADIOLOGY:  Dg Chest 2 View  Result Date: 06/12/2017 CLINICAL DATA:  83 year old female with cough. EXAM: CHEST  2 VIEW COMPARISON:  Chest radiograph dated 11/19/2016 FINDINGS: There is shallow inspiration with bibasilar atelectatic changes. Small bilateral pleural effusions may be present. Pneumonia is not excluded. There is no pneumothorax.  There is mild cardiomegaly. Median sternotomy wires and mechanical cardiac bowel noted. Left pectoral dual lead pacemaker device. No acute osseous pathology. IMPRESSION: 1. Shallow inspiration  with bibasilar atelectasis versus infiltrate. Trace bilateral pleural effusions may be present. 2. Mild cardiomegaly.  No vascular congestion or edema. Electronically Signed   By: Anner Crete M.D.   On: 06/12/2017 22:49    ASSESSMENT AND PLAN:   Active Problems:   CHF (congestive heart failure) (HCC)  * ac systolic and chf   Drop in EF   IV lasix   I/O monitoring   Appreciated cardio consult.   Stop CC blockers, add beta blockers.    Entresto.  * CAD   ASA, atorvastatin, metoprolol.  * dilated cardiomyopathy   Hold lisinopril, started enteresto.   Metoprolol.  * complete heart block   Paced rhythm.   All the records are reviewed and case discussed with Care Management/Social Workerr. Management plans discussed with the patient, family and they are in agreement.  CODE STATUS: full.  TOTAL TIME TAKING CARE OF THIS PATIENT: 35 minutes.     POSSIBLE D/C IN 1-2 DAYS, DEPENDING ON CLINICAL CONDITION.   Vaughan Basta M.D on 06/13/2017   Between 7am to 6pm - Pager - [phone]  After 6pm go to [email or web address] - password [email or web address] [place]  Office  [phone]  CC: Primary care physician; [name], MD  Note: This dictation was prepared with Dragon dictation along with smaller phrase technology. Any transcriptional errors that result from this process are unintentional.

## 2017-06-13 NOTE — Care Management (Signed)
Patient presents from [facility] with nausea and vomiting for over one month.  Chest xray was concerning for heart failure. She was not hypoxic.  She is under a long term plan of care at [facility].  she says she is happy at this facility and plans to return.  Notified CSW

## 2017-06-13 NOTE — H&P (Signed)
[facility] [place] NAME: Michele Schultz    MR#:  [id number]  DATE OF BIRTH:  1931-08-07  DATE OF ADMISSION:  06/12/2017  PRIMARY CARE PHYSICIAN: [name], MD   REQUESTING/REFERRING PHYSICIAN:   CHIEF COMPLAINT:   Chief Complaint  Patient presents with  . Nausea  . Emesis    HISTORY OF PRESENT ILLNESS: Michele Schultz  is a 83 y.o. female with a known history of Anemia, aortic stenosis, proximal atrial tachycardia, cardiac pacemaker, diastolic heart failure, complete heart block in the past, coronary artery disease, hyperlipidemia, hypertension presented to the emergency room with nausea and also difficulty breathing. Patient has some nausea and emesis. Vomitus contained food and water. She also has some shortness of breath for the last couple of days. She's not taking her Lasix. She was worked up in the emergency room chest x-ray showed bilateral pleural effusions and congestion. Patient is short of breath and does not use any home oxygen. She was evaluated in the emergency room her BNP was elevated. Hospitalist service was consulted for further care of the patient. Patient was given Lasix for diuresis. No complaints of any chest pain. Patient seen and evaluated by me on 06/12/2017. Patient lives alone at home.  PAST MEDICAL HISTORY:   Past Medical History:  Diagnosis Date  . Anemia   . Aortic stenosis, severe    a. s/p Magna Ease pericardial tissue valve size 21 mm replacement in 10/2011 for severe AS 11/12; b. echo 07/2015: EF 55-60% mod concentric LVH, GR1DD, LA mildly dilated, PASP 45 mm Hg  . Atrial tachycardia, paroxysmal (HCC)    a. with rate related LBBB.  . Cardiac pacemaker -st Judes    11/12  . Chronic diastolic heart failure ([place])    a. echo 2014: EF 55-60%, no RWMA, GR1DD, PASP 47 mm Hg; b. echo 07/2015: EF 55-60% mod concentric LVH, GR1DD, LA mildly dilated, PASP 45 mm Hg  . Complete heart block [facility])    a.  s/p St Jude PPM 10/2011 (Ser # [id number]).  . Coronary artery disease    a. s/p 2v CABG 11/12 (VG-LAD, VG-OM1); b. Lexiscan 08/2015: low risk, no ischemia, EF 55-65%.  . Enthesopathy of hip region   . Esophageal reflux    followed by Dr.Seigal. stabilized with a combination of Nexium and Zantac  . Fibromyalgia   . HLD (hyperlipidemia)   . Hypertensive heart disease   . Knee joint replacement by other means   . Morbid obesity ([place])   . Neuralgia, neuritis, and radiculitis, unspecified   . Neuropathy   . Onychia and paronychia of toe   . Osteoarthrosis, unspecified whether generalized or localized, pelvic region and thigh    mainly in her back and knees  . PAF (paroxysmal atrial fibrillation) ([place])    a. brief episodes of AF previously noted on device interrogations.  Marland Kitchen RAD (reactive airway disease)    a. chronic SOB  . Spinal stenosis   . Stress incontinence, female    followed by Dr.Cope  . Type II or unspecified type diabetes mellitus without mention of complication, not stated as uncontrolled    a. pt. reports that she is borderline   . Wide-complex tachycardia ([place])    a. Noted 4/10 - brief episode in ED. Noted again 4/21 in clinic->felt most likely to be atrial tach.    PAST SURGICAL HISTORY: Past Surgical History:  Procedure Laterality Date  . AORTIC VALVE REPLACEMENT  10/17/2011  Procedure: AORTIC VALVE REPLACEMENT (AVR);  Surgeon: Melrose Nakayama, MD;  Location: [place];  Service: Open Heart Surgery;  Laterality: N/A;  . CARDIAC CATHETERIZATION  08/2009   50% stenosis distal left main, 50% stenosis ostial left circumflex.   Marland Kitchen CARDIAC CATHETERIZATION     at [facility]  . CORONARY ARTERY BYPASS GRAFT  10/17/2011   Procedure: CORONARY ARTERY BYPASS GRAFTING (CABG);  Surgeon: Melrose Nakayama, MD;  Location: [place];  Service: Open Heart Surgery;  Laterality: N/A;  Times Two, using right leg greater saphenous vein harvested endoscopically  . ESOPHAGOGASTRODUODENOSCOPY (EGD) WITH  PROPOFOL N/A 10/12/2016   Procedure: ESOPHAGOGASTRODUODENOSCOPY (EGD) WITH PROPOFOL;  Surgeon: Lucilla Lame, MD;  Location: [facility] ENDOSCOPY;  Service: Endoscopy;  Laterality: N/A;  . EYE SURGERY     IOL/ after cataracts removed- bilateral   . NASAL SINUS SURGERY  2009  . PERMANENT PACEMAKER INSERTION N/A 10/20/2011   Procedure: PERMANENT PACEMAKER INSERTION;  Surgeon: Thompson Grayer, MD;  Location: [facility] CATH LAB;  Service: Cardiovascular;  Laterality: N/A;  . REPLACEMENT TOTAL KNEE  11/2006   right knee  . TOTAL HIP ARTHROPLASTY  09/2010    SOCIAL HISTORY:  Social History  Substance Use Topics  . Smoking status: Never Smoker  . Smokeless tobacco: Never Used  . Alcohol use No    FAMILY HISTORY:  Family History  Problem Relation Age of Onset  . Heart disease Sister   . Heart disease Brother   . Heart disease Brother   . Heart disease Brother   . Diabetes Other   . Anesthesia problems Neg Hx   . Hypotension Neg Hx   . Malignant hyperthermia Neg Hx   . Pseudochol deficiency Neg Hx     DRUG ALLERGIES:  Allergies  Allergen Reactions  . Citalopram Other (See Comments)    Reaction:  Altered mental status   . Cymbalta [Duloxetine Hcl] Other (See Comments)    Reaction:  Sedative for pt   . Imipramine Other (See Comments)    Reaction:  Unknown   . Proton Pump Inhibitors Other (See Comments)    Reaction:  Unknown   . Venlafaxine Nausea And Vomiting and Other (See Comments)    Reaction:  Dizziness     REVIEW OF SYSTEMS:   CONSTITUTIONAL: No fever, fatigue or weakness.  EYES: No blurred or double vision.  EARS, NOSE, AND THROAT: No tinnitus or ear pain.  RESPIRATORY: Has occasional cough, shortness of breath, No wheezing or hemoptysis.  CARDIOVASCULAR: No chest pain,  has orthopnea, edema.  GASTROINTESTINAL: No nausea, vomiting, diarrhea or abdominal pain.  GENITOURINARY: No dysuria, hematuria.  ENDOCRINE: No polyuria, nocturia,  HEMATOLOGY: No anemia, easy bruising or  bleeding SKIN: No rash or lesion. MUSCULOSKELETAL: No joint pain or arthritis.   NEUROLOGIC: No tingling, numbness, weakness.  PSYCHIATRY: No anxiety or depression.   MEDICATIONS AT HOME:  Prior to Admission medications   Medication Sig Start Date End Date Taking? Authorizing Provider  acidophilus (RISAQUAD) CAPS capsule Take 1 capsule by mouth 2 (two) times daily. 06/26/16   Bettey Costa, MD  aluminum-magnesium hydroxide-simethicone (MAALOX) 956-213-08 MG/5ML SUSP Take 30 mLs by mouth every 4 (four) hours as needed.    [provider]  aspirin EC 81 MG tablet Take 81 mg by mouth daily.     [provider]  atorvastatin (LIPITOR) 80 MG tablet Take 80 mg by mouth daily.    [provider]  calcium carbonate (OS-CAL) 600 MG TABS tablet Take 600 mg by  mouth daily.    [provider]  Cholecalciferol 1000 units capsule Take 1,000 Units by mouth daily.     [provider]  diltiazem (CARDIZEM CD) 180 MG 24 hr capsule Take 1 capsule (180 mg total) by mouth daily. 11/06/16   Gladstone Lighter, MD  docusate sodium (COLACE) 100 MG capsule Take 100 mg by mouth daily.    [provider]  famotidine (PEPCID) 20 MG tablet Take 1 tablet (20 mg total) by mouth 2 (two) times daily. 03/09/17   Loletha Grayer, MD  furosemide (LASIX) 20 MG tablet Take 1 tablet (20 mg total) by mouth daily. 03/09/17   Loletha Grayer, MD  HYDROcodone-acetaminophen (NORCO) 10-325 MG tablet Take 1 tablet by mouth every 4 (four) hours as needed for moderate pain. Do not exceed 3gm of Tylenol in 24 hours 03/09/17   Loletha Grayer, MD  LORazepam (ATIVAN) 0.5 MG tablet Take 1 tablet (0.5 mg total) by mouth at bedtime. 04/26/17   Reed, Tiffany L, DO  magnesium hydroxide (MILK OF MAGNESIA) 400 MG/5ML suspension Take 10 mLs by mouth daily.     [provider]  magnesium oxide (MAG-OX) 400 MG tablet Take 1 tablet (400 mg total) by mouth daily. 03/09/17   Loletha Grayer, MD   metoCLOPramide (REGLAN) 5 MG tablet Take 1 tablet (5 mg total) by mouth 4 (four) times daily -  before meals and at bedtime. 03/09/17   Loletha Grayer, MD  nitroGLYCERIN (NITROSTAT) 0.4 MG SL tablet Place 1 tablet (0.4 mg total) under the tongue every 5 (five) minutes as needed for chest pain. 01/24/13   Minna Merritts, MD  ondansetron (ZOFRAN) 8 MG tablet Take 8 mg by mouth every 8 (eight) hours as needed for nausea or vomiting.    [provider]  polyethylene glycol powder (GLYCOLAX/MIRALAX) powder Take 255 g by mouth daily. Mix entire contents of miralax (255g) with 64 oz of gatorade in a pitcher.   Drink 8oz of mixture every 30 minutes until you have multiple loose bowel movements. 03/16/17   Harvest Dark, MD  Potassium Chloride ER 20 MEQ TBCR Take 20 mEq by mouth 2 (two) times daily. 03/09/17   Loletha Grayer, MD  promethazine (PHENERGAN) 12.5 MG tablet Take 1 tablet (12.5 mg total) by mouth every 6 (six) hours as needed for nausea. 03/09/17   Loletha Grayer, MD  sertraline (ZOLOFT) 50 MG tablet Take 150 mg by mouth at bedtime.     [provider]      PHYSICAL EXAMINATION:   VITAL SIGNS: Blood pressure 125/83, pulse 61, temperature 97.6 F (36.4 C), temperature source Oral, resp. rate (!) 22, height 5\' 4"  (1.626 m), weight 86.2 kg (190 lb), SpO2 100 %.  GENERAL:  83 y.o.-year-old patient lying in the bed with no acute distress.  EYES: Pupils equal, round, reactive to light and accommodation. No scleral icterus. Extraocular muscles intact.  HEENT: Head atraumatic, normocephalic. Oropharynx and nasopharynx clear.  NECK:  Supple, no jugular venous distention. No thyroid enlargement, no tenderness.  LUNGS: Decreased breath sounds bilaterally, bibasilar crepitations heard. No use of accessory muscles of respiration.  CARDIOVASCULAR: S1, S2 normal. No murmurs, rubs, or gallops.  ABDOMEN: Soft, nontender, nondistended. Bowel sounds present. No organomegaly or mass.   EXTREMITIES: Has pedal edema, no cyanosis, or clubbing.  NEUROLOGIC: Cranial nerves II through XII are intact. Muscle strength 5/5 in all extremities. Sensation intact. Gait not checked.  PSYCHIATRIC: The patient is alert and oriented x 3.  SKIN: No obvious  rash, lesion, or ulcer.   LABORATORY PANEL:   CBC  Recent Labs Lab 06/12/17 2130  WBC 6.5  HGB 11.2*  HCT 34.8*  PLT 295  MCV 83.7  MCH 27.0  MCHC 32.3  RDW 18.9*   ------------------------------------------------------------------------------------------------------------------  Chemistries   Recent Labs Lab 06/12/17 2045  NA 140  K 4.3  CL 111  CO2 21*  GLUCOSE 123*  BUN 13  CREATININE 0.75  CALCIUM 9.3  MG 1.8  AST 26  ALT 17  ALKPHOS 74  BILITOT 0.8   ------------------------------------------------------------------------------------------------------------------ estimated creatinine clearance is 55.6 mL/min (by C-G formula based on SCr of 0.75 mg/dL). ------------------------------------------------------------------------------------------------------------------ No results for input(s): TSH, T4TOTAL, T3FREE, THYROIDAB in the last 72 hours.  Invalid input(s): FREET3   Coagulation profile No results for input(s): INR, PROTIME in the last 168 hours. ------------------------------------------------------------------------------------------------------------------- No results for input(s): DDIMER in the last 72 hours. -------------------------------------------------------------------------------------------------------------------  Cardiac Enzymes  Recent Labs Lab 06/12/17 2045  TROPONINI <0.03   ------------------------------------------------------------------------------------------------------------------ Invalid input(s): POCBNP  ---------------------------------------------------------------------------------------------------------------  Urinalysis    Component Value Date/Time    COLORURINE YELLOW (A) 03/05/2017 1733   APPEARANCEUR CLEAR (A) 03/05/2017 1733   APPEARANCEUR Clear 12/04/2014 1618   LABSPEC 1.006 03/05/2017 1733   LABSPEC 1.006 12/04/2014 1618   PHURINE 7.0 03/05/2017 1733   GLUCOSEU NEGATIVE 03/05/2017 1733   GLUCOSEU Negative 12/04/2014 1618   HGBUR MODERATE (A) 03/05/2017 1733   BILIRUBINUR NEGATIVE 03/05/2017 1733   BILIRUBINUR Negative 12/04/2014 1618   KETONESUR NEGATIVE 03/05/2017 1733   PROTEINUR NEGATIVE 03/05/2017 1733   UROBILINOGEN 0.2 10/16/2011 0950   NITRITE NEGATIVE 03/05/2017 1733   LEUKOCYTESUR NEGATIVE 03/05/2017 1733   LEUKOCYTESUR Trace 12/04/2014 1618     RADIOLOGY: Dg Chest 2 View  Result Date: 06/12/2017 CLINICAL DATA:  83 year old female with cough. EXAM: CHEST  2 VIEW COMPARISON:  Chest radiograph dated 11/19/2016 FINDINGS: There is shallow inspiration with bibasilar atelectatic changes. Small bilateral pleural effusions may be present. Pneumonia is not excluded. There is no pneumothorax. There is mild cardiomegaly. Median sternotomy wires and mechanical cardiac bowel noted. Left pectoral dual lead pacemaker device. No acute osseous pathology. IMPRESSION: 1. Shallow inspiration with bibasilar atelectasis versus infiltrate. Trace bilateral pleural effusions may be present. 2. Mild cardiomegaly.  No vascular congestion or edema. Electronically Signed   By: Anner Crete M.D.   On: 06/12/2017 22:49    EKG: Orders placed or performed during the hospital encounter of 06/12/17  . EKG 12-Lead  . EKG 12-Lead    IMPRESSION AND PLAN: 83 year old female patient with history of diastolic heart failure, aortic stenosis, pacemaker, atrial tachycardia, coronary artery disease, hypertension presented to the emergency room with nausea and shortness of breath. Admitting diagnosis 1. Acute diastolic heart failure exacerbation 2. Nausea 3. Coronary artery disease 4. Hypertension 5. Aortic stenosis Treatment plan Admit patient to  telemetry inpatient service Diurese patient with IV Lasix 40 MG every 12 hourly Check echocardiogram Cardiology consultation Resume aspirin and statin medication DVT prophylaxis subcutaneous Lovenox 40 MG daily Cycle troponin  All the records are reviewed and case discussed with ED provider. Management plans discussed with the patient, family and they are in agreement.  CODE STATUS:FULL CODE Code Status History    Date Active Date Inactive Code Status Order ID Comments User Context   03/06/2017 12:32 AM 03/09/2017 10:02 PM Full Code [id number]  Max Sane, MD ED   03/05/2017 10:41 PM 03/06/2017 12:32 AM DNR [id number]  Max Sane, MD ED   11/01/2016  8:09 AM  11/06/2016  4:51 PM DNR [id number]  Epifanio Lesches, MD ED   10/12/2016  1:36 AM 10/21/2016  6:48 PM DNR [id number]  Lance Coon, MD Inpatient   09/19/2016 10:52 AM 09/22/2016  8:50 PM DNR [id number]  Knox Royalty, NP Inpatient   09/17/2016  5:57 PM 09/19/2016 10:52 AM Full Code [id number]  Theodoro Grist, MD Inpatient   06/23/2016  8:20 PM 06/26/2016  1:26 PM Full Code [id number]  Gladstone Lighter, MD Inpatient   06/16/2016  8:35 PM 06/21/2016 10:09 PM Full Code [id number]  Lytle Butte, MD ED   03/13/2016  8:15 PM 03/14/2016  9:02 PM Full Code [id number]  Henreitta Leber, MD Inpatient   09/13/2015  6:51 PM 09/18/2015  7:31 PM Full Code [id number]  Dustin Flock, MD Inpatient   10/20/2011  1:40 PM 10/22/2011  1:27 PM Full Code [id number]  Tiajuana Amass, RN Inpatient    Advance Directive Documentation     Most Recent Value  Type of Advance Directive  Healthcare Power of Attorney, Living will  Pre-existing out of facility DNR order (yellow form or pink MOST form)  -  "MOST" Form in Place?  -       TOTAL TIME TAKING CARE OF THIS PATIENT: 55 minutes.    Saundra Shelling M.D on 06/13/2017 at 12:11 AM  Between 7am to 6pm - Pager - [phone]  After 6pm go to [email or web address] - password [email or web address] [facility]  Office   [phone]  CC: Primary care physician; [name], MD

## 2017-06-13 NOTE — Discharge Instructions (Signed)
Heart Failure Clinic appointment on June 27, 2017 at 10:00am with Darylene Price, [place]. Please call [phone] to reschedule.

## 2017-06-13 NOTE — Progress Notes (Signed)
[place] received a OR to pray with PT. [place] went to   06/13/17 0700  Clinical Encounter Type  Visited With Patient  Visit Type Initial  Referral From Nurse  Consult/Referral To [place]   room and prayed with PT.

## 2017-06-13 NOTE — Consult Note (Signed)
Cardiology Consult    Patient ID: TRIXY LOYOLA MRN: [id number], DOB/AGE: Apr 01, 1931   Admit date: 06/12/2017 Date of Consult: 06/13/2017  Primary Physician: [name], MD Reason for Consult: CHF Primary Cardiologist: Dr. Rockey Situ Requesting Provider: Dr. Estanislado Pandy   History of Present Illness    Michele Schultz is a 83 y.o. female with past medical history of CAD (s/p CABG in 10/2011 with SVG-LAD and SVG-OM1 with low-risk NST in 08/2015), severe AS (s/p AVR in 2012), chronic diastolic CHF, CHB (s/p PPM placement in 10/2011), PAT, HTN, HLD, Type 2 DM, and persistent nausea (thought secondary to gastroparesis) who is being seen today for the evaluation of CHF at the request of Dr. Estanislado Pandy.   She was admitted to [facility] on 03/2017 for nausea, vomiting, and diarrhea. Had associated hypokalemia and hypomagnesemia. Was noted to have episodes of presumed atrial fibrillation during her admission. Was not started on anticoagulation at that time as her metabolic abnornmalities and acute illness were thought to be her triggers. Was continued on Cardizem CD and ASA 81mg  daily.   She presented back to the ED from [facility] on 06/12/2017 for decreased oral intake and nausea for the past month. She reports having these symptoms for years but says she has a limited appetite secondary to her persistent nausea.   She notes having worsening dyspnea at rest and orthopnea for the past few weeks with an associated dry cough. No abdominal distension or lower extremity edema. She is wheelchair bound at baseline and therefore unable to comment on exertional symptoms. Denies any palpitations or chest pain. Does have occasional tightness in her chest with coughing. She does take Lasix 20mg  daily PTA. Unaware of daily weights but weight was at 241 lbs at the time of hospital discharge in 03/2017, at 221 lbs today.   Initial labs show WBC of 6.5, Hgb 11.2, platelets 295. Na+ 140, K+ 4.3, creatinine  0.75. Mg 1.8. BNP 736. Cyclic troponin values have been negative. CXR shows bibasilar atelectasis versus infiltrate with trace bilateral pleural effusions. EKG showed AV-paced, HR 96.   Past Medical History   Past Medical History:  Diagnosis Date  . Anemia   . Aortic stenosis, severe    a. s/p Magna Ease pericardial tissue valve size 21 mm replacement in 10/2011 for severe AS 11/12; b. echo 07/2015: EF 55-60% mod concentric LVH, GR1DD, LA mildly dilated, PASP 45 mm Hg  . Atrial tachycardia, paroxysmal (HCC)    a. with rate related LBBB.  . Cardiac pacemaker -st Judes    11/12  . Chronic diastolic heart failure ([place])    a. echo 2014: EF 55-60%, no RWMA, GR1DD, PASP 47 mm Hg; b. echo 07/2015: EF 55-60% mod concentric LVH, GR1DD, LA mildly dilated, PASP 45 mm Hg  . Complete heart block [facility])    a. s/p St Jude PPM 10/2011 (Ser # [id number]).  . Coronary artery disease    a. s/p 2v CABG 11/12 (VG-LAD, VG-OM1); b. Lexiscan 08/2015: low risk, no ischemia, EF 55-65%.  . Enthesopathy of hip region   . Esophageal reflux    followed by Dr.Seigal. stabilized with a combination of Nexium and Zantac  . Fibromyalgia   . HLD (hyperlipidemia)   . Hypertensive heart disease   . Knee joint replacement by other means   . Morbid obesity ([place])   . Neuralgia, neuritis, and radiculitis, unspecified   . Neuropathy   . Onychia and paronychia of toe   . Osteoarthrosis, unspecified whether generalized  or localized, pelvic region and thigh    mainly in her back and knees  . PAF (paroxysmal atrial fibrillation) ([place])    a. brief episodes of AF previously noted on device interrogations.  Marland Kitchen RAD (reactive airway disease)    a. chronic SOB  . Spinal stenosis   . Stress incontinence, female    followed by Dr.Cope  . Type II or unspecified type diabetes mellitus without mention of complication, not stated as uncontrolled    a. pt. reports that she is borderline   . Wide-complex tachycardia ([place])    a. Noted 4/10 -  brief episode in ED. Noted again 4/21 in clinic->felt most likely to be atrial tach.    Past Surgical History:  Procedure Laterality Date  . AORTIC VALVE REPLACEMENT  10/17/2011   Procedure: AORTIC VALVE REPLACEMENT (AVR);  Surgeon: Melrose Nakayama, MD;  Location: [place];  Service: Open Heart Surgery;  Laterality: N/A;  . CARDIAC CATHETERIZATION  08/2009   50% stenosis distal left main, 50% stenosis ostial left circumflex.   Marland Kitchen CARDIAC CATHETERIZATION     at [facility]  . CORONARY ARTERY BYPASS GRAFT  10/17/2011   Procedure: CORONARY ARTERY BYPASS GRAFTING (CABG);  Surgeon: Melrose Nakayama, MD;  Location: [place];  Service: Open Heart Surgery;  Laterality: N/A;  Times Two, using right leg greater saphenous vein harvested endoscopically  . ESOPHAGOGASTRODUODENOSCOPY (EGD) WITH PROPOFOL N/A 10/12/2016   Procedure: ESOPHAGOGASTRODUODENOSCOPY (EGD) WITH PROPOFOL;  Surgeon: Lucilla Lame, MD;  Location: [facility] ENDOSCOPY;  Service: Endoscopy;  Laterality: N/A;  . EYE SURGERY     IOL/ after cataracts removed- bilateral   . NASAL SINUS SURGERY  2009  . PERMANENT PACEMAKER INSERTION N/A 10/20/2011   Procedure: PERMANENT PACEMAKER INSERTION;  Surgeon: Thompson Grayer, MD;  Location: [facility] CATH LAB;  Service: Cardiovascular;  Laterality: N/A;  . REPLACEMENT TOTAL KNEE  11/2006   right knee  . TOTAL HIP ARTHROPLASTY  09/2010     Allergies  Allergies  Allergen Reactions  . Citalopram Other (See Comments)    Reaction:  Altered mental status   . Cymbalta [Duloxetine Hcl] Other (See Comments)    Reaction:  Sedative for pt   . Imipramine Other (See Comments)    Reaction:  Unknown   . Proton Pump Inhibitors Other (See Comments)    Reaction:  Unknown   . Venlafaxine Nausea And Vomiting and Other (See Comments)    Reaction:  Dizziness     Inpatient Medications    . acidophilus  1 capsule Oral BID  . aspirin EC  81 mg Oral Daily  . atorvastatin  80 mg Oral Daily  . calcium carbonate  1 tablet Oral Daily    . cholecalciferol  1,000 Units Oral Daily  . diltiazem  180 mg Oral Daily  . docusate sodium  100 mg Oral Daily  . enoxaparin (LOVENOX) injection  40 mg Subcutaneous Q24H  . famotidine  20 mg Oral BID  . furosemide  40 mg Intravenous Q12H  . lisinopril  5 mg Oral Daily  . LORazepam  0.5 mg Oral QHS  . magnesium hydroxide  10 mL Oral Daily  . magnesium oxide  400 mg Oral Daily  . metoCLOPramide  5 mg Oral TID AC & HS  . potassium chloride SA  20 mEq Oral BID  . sertraline  150 mg Oral QHS  . sodium chloride flush  3 mL Intravenous Q12H    Family History    Family History  Problem Relation  Age of Onset  . Heart disease Sister   . Heart disease Brother   . Heart disease Brother   . Heart disease Brother   . Diabetes Other   . Anesthesia problems Neg Hx   . Hypotension Neg Hx   . Malignant hyperthermia Neg Hx   . Pseudochol deficiency Neg Hx     Social History    Social History   Social History  . Marital status: Widowed    Spouse name: N/A  . Number of children: N/A  . Years of education: N/A   Occupational History  . retired    Social History Main Topics  . Smoking status: Never Smoker  . Smokeless tobacco: Never Used  . Alcohol use No  . Drug use: No  . Sexual activity: No   Other Topics Concern  . Not on file   Social History Narrative  . No narrative on file     Review of Systems    General:  No chills, fever, night sweats or weight changes.  Cardiovascular:  No chest pain, edema, palpitations, paroxysmal nocturnal dyspnea. Positive for orthopnea and dyspnea.  Dermatological: No rash, lesions/masses Respiratory: Positive for dry cough and dyspnea. Urologic: No hematuria, dysuria Abdominal:   No nausea, vomiting, diarrhea, bright red blood per rectum, melena, or hematemesis Neurologic:  No visual changes, wkns, changes in mental status. All other systems reviewed and are otherwise negative except as noted above.  Physical Exam    Blood pressure  (!) 105/56, pulse (!) 59, temperature 98.2 F (36.8 C), temperature source Oral, resp. rate 18, height 5\' 4"  (1.626 m), weight 221 lb 3.2 oz (100.3 kg), SpO2 91 %.  General: Pleasant African American female appearing in NAD Psych: Normal affect. Neuro: Alert and oriented X 3. Moves all extremities spontaneously. HEENT: Normal  Neck: Supple without bruits or JVD. Lungs:  Resp regular and unlabored, decreased breath sounds at bases bilaterally. Poor inspiratory effort. Expiratory wheezing along upper lung fields. Heart: RRR no s3, s4, or murmurs. Abdomen: Soft, non-tender, non-distended, BS + x 4.  Extremities: No clubbing, cyanosis or edema. DP/PT/Radials 2+ and equal bilaterally.  Labs    Troponin (Point of Care Test) No results for input(s): TROPIPOC in the last 72 hours.  Recent Labs  06/12/17 2045 06/13/17 0130 06/13/17 0725  TROPONINI <0.03 <0.03 <0.03   Lab Results  Component Value Date   WBC 6.0 06/13/2017   HGB 10.7 (L) 06/13/2017   HCT 32.7 (L) 06/13/2017   MCV 83.2 06/13/2017   PLT 292 06/13/2017    Recent Labs Lab 06/12/17 2045 06/13/17 0130  NA 140 138  K 4.3 3.8  CL 111 107  CO2 21* 22  BUN 13 12  CREATININE 0.75 0.65  CALCIUM 9.3 9.2  PROT 7.6  --   BILITOT 0.8  --   ALKPHOS 74  --   ALT 17  --   AST 26  --   GLUCOSE 123* 116*   No results found for: CHOL, HDL, LDLCALC, TRIG No results found for: [facility]   Radiology Studies    Dg Chest 2 View  Result Date: 06/12/2017 CLINICAL DATA:  83 year old female with cough. EXAM: CHEST  2 VIEW COMPARISON:  Chest radiograph dated 11/19/2016 FINDINGS: There is shallow inspiration with bibasilar atelectatic changes. Small bilateral pleural effusions may be present. Pneumonia is not excluded. There is no pneumothorax. There is mild cardiomegaly. Median sternotomy wires and mechanical cardiac bowel noted. Left pectoral dual lead pacemaker device. No acute  osseous pathology. IMPRESSION: 1. Shallow inspiration with  bibasilar atelectasis versus infiltrate. Trace bilateral pleural effusions may be present. 2. Mild cardiomegaly.  No vascular congestion or edema. Electronically Signed   By: Anner Crete M.D.   On: 06/12/2017 22:49    EKG & Cardiac Imaging    EKG: AV-paced, HR 96. - Personally Reviewed  Echocardiogram: 07/2015 Study Conclusions  - Left ventricle: The cavity size was normal. There was moderate   concentric hypertrophy. Systolic function was normal. The   estimated ejection fraction was in the range of 55% to 60%. Wall   motion was normal; there were no regional wall motion   abnormalities. Doppler parameters are consistent with abnormal   left ventricular relaxation (grade 1 diastolic dysfunction). - Aortic valve: There was mild stenosis. Mean gradient (S): 14 mm   Hg. - Left atrium: The atrium was mildly dilated. - Pulmonary arteries: Systolic pressure was mildly to moderately   increased. PA peak pressure: 45 mm Hg (S).  Impressions:  - The aortic valve is not well seen but no significant change in   gradient since last study.  Assessment & Plan   1. Acute on Chronic Diastolic CHF - admitted for worsening nausea and decreased appetite. Also reports having dyspnea at rest and orthopnea for the past few weeks with an associated dry cough. No abdominal distension or lower extremity edema.  - she does not appear volume overloaded by physical examination. Was on PO Lasix 20mg  daily prior to admission.  - weight was at 241 lbs at the time of hospital discharge in 03/2017, at 221 lbs today. - BNP 736. CXR shows bibasilar atelectasis versus infiltrate with trace bilateral pleural effusions. Repeat echocardiogram is pending to assess LV function and wall motion. - has been started on IV Lasix 40mg  BID. Continue with diuresis at this time. Monitor I&O's and daily weights. Will likely require a urinary catheter as she is nonambulatory. Repeat BMET in AM to assess kidney function and  K+ levels.   2. CAD - s/p CABG in 10/2011 with SVG-LAD and SVG-OM1 with low-risk NST in 08/2015]\ - she is wheelchair bound at baseline. No anginal symptoms noted.  - cyclic troponin values have been negative. EKG shows no acute changes.  - no plans for further ischemic evaluation at this time.  - continue ASA and statin therapy.   3. Severe AS  - s/p AVR in 2012. Echo in 07/2015 showed mild stenosis. - repeat echocardiogram is pending to assess valve gradients.   4. CHB - s/p PPM placement in 10/2011 - AV-paced on EKG. Continue to follow on telemetry.   5. PAT/ Possible Episodes of PAF - possible episodes of PAF during admission in 03/2017 at which time she had multiple electrolyte abnormalities. Not started on anticoagulation at that time and remained on ASA 81mg  daily.  - continue to monitor on telemetry this admission.    6. HTN - BP at 105/56 - 134/117 in the past 24 hours. - continue Cardizem CD 180mg  daily and Lisinopril 5mg  daily.   7. Persistent nausea - thought secondary to gastroparesis. - receiving IV Zofran.  - per admitting team.   Signed, Erma Heritage, PA-C 06/13/2017, 9:01 AM Pager: [phone]

## 2017-06-14 DIAGNOSIS — I5021 Acute systolic (congestive) heart failure: Secondary | ICD-10-CM

## 2017-06-14 LAB — BASIC METABOLIC PANEL
ANION GAP: 7 (ref 5–15)
BUN: 14 mg/dL (ref 6–20)
CALCIUM: 9.3 mg/dL (ref 8.9–10.3)
CO2: 27 mmol/L (ref 22–32)
CREATININE: 0.72 mg/dL (ref 0.44–1.00)
Chloride: 109 mmol/L (ref 101–111)
Glucose, Bld: 100 mg/dL — ABNORMAL HIGH (ref 65–99)
Potassium: 4.2 mmol/L (ref 3.5–5.1)
SODIUM: 143 mmol/L (ref 135–145)

## 2017-06-14 MED ORDER — SUCRALFATE 1 G PO TABS
1.0000 g | ORAL_TABLET | Freq: Three times a day (TID) | ORAL | Status: DC
  Administered 2017-06-14 – 2017-06-25 (×45): 1 g via ORAL
  Filled 2017-06-14 (×43): qty 1

## 2017-06-14 NOTE — Progress Notes (Signed)
Patient Name: Michele Schultz Date of Encounter: 06/14/2017  Primary Cardiologist: [facility] Problem List     Active Problems:   CHF (congestive heart failure) (HCC)     Subjective   Notes frequent cough that has been productive of white to yellow sputum overnight. Sore from coughing, otherwise no chest pain. TTE on 7/11 showed newly reduced LVSF with an EF of 20-25%, diffuse HK. Diuresed 1.5 L. Renal function stable. Weight down to 217 pounds this morning (last clinic weight of 275 pounds in 05/2016). Reports no appetite. Albumin 4.0 from 06/12/2017.   Inpatient Medications    Scheduled Meds: . acidophilus  1 capsule Oral BID  . aspirin EC  81 mg Oral Daily  . atorvastatin  80 mg Oral Daily  . calcium carbonate  1 tablet Oral Daily  . cholecalciferol  1,000 Units Oral Daily  . docusate sodium  100 mg Oral Daily  . enoxaparin (LOVENOX) injection  40 mg Subcutaneous Q24H  . famotidine  20 mg Oral BID  . furosemide  40 mg Intravenous Q12H  . gabapentin  100 mg Oral QHS  . lamoTRIgine  50 mg Oral BID  . LORazepam  0.5 mg Oral QHS  . magnesium hydroxide  10 mL Oral Daily  . magnesium oxide  400 mg Oral Daily  . metoCLOPramide  5 mg Oral TID AC & HS  . metoprolol succinate  50 mg Oral Daily  . potassium chloride SA  20 mEq Oral BID  . QUEtiapine  25 mg Oral BID  . [START ON 06/15/2017] sacubitril-valsartan  1 tablet Oral BID  . sertraline  150 mg Oral QHS  . sodium chloride flush  3 mL Intravenous Q12H   Continuous Infusions: . sodium chloride     PRN Meds: sodium chloride, acetaminophen, HYDROcodone-acetaminophen, ipratropium-albuterol, nitroGLYCERIN, ondansetron (ZOFRAN) IV, ondansetron, polyethylene glycol powder, promethazine, sodium chloride flush   Vital Signs    Vitals:   06/13/17 0526 06/13/17 1218 06/13/17 2028 06/14/17 0503  BP: (!) 105/56 123/85 95/61 109/63  Pulse: (!) 59 (!) 58 78 87  Resp:  18 16 18   Temp: 98.2 F (36.8 C) 97.8 F (36.6 C)  98.4 F (36.9 C) 98.2 F (36.8 C)  TempSrc: Oral Oral Oral Oral  SpO2: 91% 94% 95% 100%  Weight:    217 lb 6.4 oz (98.6 kg)  Height:        Intake/Output Summary (Last 24 hours) at 06/14/17 0802 Last data filed at 06/14/17 0505  Gross per 24 hour  Intake              360 ml  Output              650 ml  Net             -290 ml   Filed Weights   06/12/17 2037 06/13/17 0109 06/14/17 0503  Weight: 190 lb (86.2 kg) 221 lb 3.2 oz (100.3 kg) 217 lb 6.4 oz (98.6 kg)    Physical Exam    GEN: Well nourished, well developed, in no acute distress.  HEENT: Grossly normal.  Neck: Supple, no JVD, carotid bruits, or masses. Cardiac: RRR, II/VI systolic murmur LUSB, no rubs, or gallops. No clubbing, cyanosis, edema.  Radials/DP/PT 2+ and equal bilaterally.  Respiratory:  Diminished breath sounds bilaterally. GI: Soft, nontender, nondistended, BS + x 4. MS: no deformity or atrophy. Skin: warm and dry, no rash. Neuro:  Strength and sensation are intact. Psych: AAOx3.  Normal affect.  Labs    CBC  Recent Labs  06/12/17 2130 06/13/17 0130  WBC 6.5 6.0  HGB 11.2* 10.7*  HCT 34.8* 32.7*  MCV 83.7 83.2  PLT 295 740   Basic Metabolic Panel  Recent Labs  06/12/17 2045 06/13/17 0130 06/14/17 0442  NA 140 138 143  K 4.3 3.8 4.2  CL 111 107 109  CO2 21* 22 27  GLUCOSE 123* 116* 100*  BUN 13 12 14   CREATININE 0.75 0.65 0.72  CALCIUM 9.3 9.2 9.3  MG 1.8  --   --    Liver Function Tests  Recent Labs  06/12/17 2045  AST 26  ALT 17  ALKPHOS 74  BILITOT 0.8  PROT 7.6  ALBUMIN 4.0    Recent Labs  06/12/17 2045  LIPASE 44   Cardiac Enzymes  Recent Labs  06/13/17 0130 06/13/17 0725 06/13/17 1330  TROPONINI <0.03 <0.03 <0.03   BNP Invalid input(s): POCBNP D-Dimer No results for input(s): DDIMER in the last 72 hours. Hemoglobin A1C No results for input(s): HGBA1C in the last 72 hours. Fasting Lipid Panel No results for input(s): CHOL, HDL, LDLCALC, TRIG,  CHOLHDL, LDLDIRECT in the last 72 hours. Thyroid Function Tests No results for input(s): TSH, T4TOTAL, T3FREE, THYROIDAB in the last 72 hours.  Invalid input(s): FREET3  Telemetry    Intermittent V pacing, runs of sinus tach - Personally Reviewed  ECG    n/a - Personally Reviewed  Radiology    Dg Chest 2 View  Result Date: 06/12/2017 IMPRESSION: 1. Shallow inspiration with bibasilar atelectasis versus infiltrate. Trace bilateral pleural effusions may be present. 2. Mild cardiomegaly.  No vascular congestion or edema. Electronically Signed   By: Anner Crete M.D.   On: 06/12/2017 22:49    Cardiac Studies   TTE 06/13/2017: Study Conclusions  - Left ventricle: The cavity size was moderately dilated. Systolic   function was severely reduced. The estimated ejection fraction   was in the range of 20% to 25%. Diffuse hypokinesis. - Aortic valve: There was trivial regurgitation. Valve area (VTI):   1.06 cm^2. Valve area (Vmax): 0.89 cm^2. Valve area (Vmean): 0.95   cm^2. - Mitral valve: Calcified annulus. Mildly thickened leaflets .   There was severe regurgitation. - Left atrium: The atrium was mildly dilated. - Right atrium: The atrium was mildly dilated. - Tricuspid valve: There was moderate regurgitation.  Impressions:  - The right ventricular systolic pressure was increased consistent   with moderate pulmonary hypertension.  Patient Profile     83 y.o. female with history of CAD (s/p CABG in 10/2011 with SVG-LAD and SVG-OM1 with low-risk NST in 08/2015), severe AS (s/p AVR in 2012), chronic diastolic CHF, CHB (s/p PPM placement in 10/2011), PAT, HTN, HLD, Type 2 DM, and persistent nausea (thought secondary to gastroparesis) who was admitted for the evaluation of CHF.   Assessment & Plan    1. Acute systolic CHF/acute on chronic diastolic CHF/dilated cardiomyopathy: -newly reduced EF as above -Will need a couple more days of diuresis (unable to lay flat currently)  followed by [facility] first of the following week -Will change PO Lasix to IV 20 mg bid with KCl repletion  -Toprol XL as above -ACEi held as of AM of 7/11, planning to start Entresto on 7/13 (after 36 hour ACEi washout) -Consider addition of spironolactone prior to discharge -Strict Is and Os -Daily weights  2. CAD: -S/p CABG in 10/2011 with SVG-LAD and SVG-OM1 with low-risk NST in 08/2015 -  No anginal symptoms -Troponins negative -ASA and statin -Will need ischemic evaluation as above prior to discharge  3. AS: -As above -Ischemic evaluation as above  4. Complete heart block: -S/p PPM -Device appears to be functioning appropriately   5. Decreased appetite/gastroparesis: -Albumin 4.0 at admission -Per IM  Signed, Christell Faith, PA-C [facility] Pager: [phone] 06/14/2017, 8:02 AM

## 2017-06-14 NOTE — Plan of Care (Signed)
Problem: Safety: Goal: Ability to remain free from injury will improve Outcome: Progressing Fall precautions inplace, non skid socks when oob  Problem: Fluid Volume: Goal: Ability to maintain a balanced intake and output will improve Outcome: Progressing Intake and output, daily weights  Problem: Cardiac: Goal: Ability to achieve and maintain adequate cardiopulmonary perfusion will improve Outcome: Progressing Receiving IV Lasix BID, intake and output, daily weights

## 2017-06-14 NOTE — NC FL2 (Signed)
[place] LEVEL OF CARE SCREENING TOOL     IDENTIFICATION  Patient Name: Michele Schultz Birthdate: [birth year] Sex: female Admission Date (Current Location): 06/12/2017  [place] and [place] Number:  Engineering geologist and Address:  [facility], [address] [place], [place] [address]      Provider Number: [id number]  Attending Physician Name and Address:  Vaughan Basta, *  Relative Name and Phone Number:  Hughie Closs Niece [phone]  [phone]     Current Level of Care: Hospital Recommended Level of Care: [place] Prior Approval Number:    Date Approved/Denied:   PASRR Number: [id number]  Discharge Plan: SNF    Current Diagnoses: Patient Active Problem List   Diagnosis Date Noted  . CHF (congestive heart failure) ([place]) 06/12/2017  . Nausea vomiting and diarrhea 03/05/2017  . Adjustment disorder with depressed mood 11/02/2016  . Nausea & vomiting 11/01/2016  . Nausea and vomiting   . Atrial fibrillation with RVR ([place]) 10/11/2016  . Hypokalemia 10/11/2016  . DNR (do not resuscitate) 09/19/2016  . Palliative care by specialist 09/19/2016  . Muscle weakness (generalized)   . Atrial tachycardia ([place])   . Diarrhea 09/17/2016  . Dehydration 09/17/2016  . Metabolic acidosis 42/68/3419  . Headache due to trauma 07/03/2016  . Wide-complex tachycardia ([place])   . Pain in the chest   . Emesis   . Weakness of both legs   . Coronary artery disease involving coronary bypass graft of native heart with unspecified angina pectoris   . S/P AVR (aortic valve replacement)   . Falls frequently   . HLD (hyperlipidemia)   . Fibromyalgia   . RAD (reactive airway disease)   . Morbid obesity ([place])   . Aortic stenosis, severe   . Chronic pain 06/17/2014  . Pain in the muscles 06/19/2013  . Chest pain 06/19/2013  . Wheezing 06/19/2013  . Weakness generalized 06/03/2013  . Dyspnea 01/27/2013  . Nausea  03/05/2012  . Constipation 03/05/2012  . Atrial fibrillation-non sustained 02/09/2012  . Cardiac pacemaker -st [place]   . Chronic diastolic heart failure ([place])   . Coronary artery disease   . Long term (current) use of anticoagulants 01/03/2012  . S/P CABG x 2 11/14/2011  . S/P aortic valve replacement with porcine valve 11/14/2011  . Back pain 11/14/2011  . Complete heart block ([place]) 10/20/2011  . Hypertensive heart disease with CHF (congestive heart failure) ([place]) 09/15/2011  . Spinal stenosis 03/22/2011  . Fatigue 03/22/2011  . Hyperlipidemia 03/22/2011    Orientation RESPIRATION BLADDER Height & Weight     Self, Situation  Normal Incontinent Weight: 217 lb 6.4 oz (98.6 kg) Height:  5\' 4"  (162.6 cm)  BEHAVIORAL SYMPTOMS/MOOD NEUROLOGICAL BOWEL NUTRITION STATUS      Continent Diet (2 gram sodium diet)  AMBULATORY STATUS COMMUNICATION OF NEEDS Skin   Extensive Assist Verbally Normal                       Personal Care Assistance Level of Assistance  Bathing, Feeding, Dressing Bathing Assistance: Limited assistance Feeding assistance: Independent Dressing Assistance: Limited assistance     Functional Limitations Info  Sight, Hearing, Speech Sight Info: Adequate Hearing Info: Adequate Speech Info: Adequate    SPECIAL CARE FACTORS FREQUENCY                       Contractures Contractures Info: Not present    Additional Factors Info  Psychotropic Code Status Info: Full Code Allergies Info: CITALOPRAM, CYMBALTA DULOXETINE HCL, IMIPRAMINE, PROTON PUMP INHIBITORS, VENLAFAXINE  Psychotropic Info: LORazepam (ATIVAN) tablet 0.5 mg and QUEtiapine (SEROQUEL) tablet 25 mg and sertraline (ZOLOFT) tablet 150 mg         Current Medications (06/14/2017):  This is the current hospital active medication list Current Facility-Administered Medications  Medication Dose Route Frequency Provider Last Rate Last Dose  . 0.9 %  sodium chloride infusion  250 mL Intravenous  PRN Saundra Shelling, MD      . acetaminophen (TYLENOL) tablet 650 mg  650 mg Oral Q4H PRN Pyreddy, Reatha Harps, MD      . acidophilus (RISAQUAD) capsule 1 capsule  1 capsule Oral BID Saundra Shelling, MD   1 capsule at 06/14/17 0851  . aspirin EC tablet 81 mg  81 mg Oral Daily Pyreddy, Reatha Harps, MD   81 mg at 06/14/17 0850  . atorvastatin (LIPITOR) tablet 80 mg  80 mg Oral Daily Pyreddy, Reatha Harps, MD   80 mg at 06/14/17 0850  . calcium carbonate (TUMS - dosed in mg elemental calcium) chewable tablet 200 mg of elemental calcium  1 tablet Oral Daily Pyreddy, Pavan, MD   200 mg of elemental calcium at 06/14/17 0852  . cholecalciferol (VITAMIN D) tablet 1,000 Units  1,000 Units Oral Daily Saundra Shelling, MD   1,000 Units at 06/14/17 0851  . docusate sodium (COLACE) capsule 100 mg  100 mg Oral Daily Pyreddy, Reatha Harps, MD   100 mg at 06/14/17 0852  . enoxaparin (LOVENOX) injection 40 mg  40 mg Subcutaneous Q24H Pyreddy, Reatha Harps, MD   40 mg at 06/13/17 2129  . famotidine (PEPCID) tablet 20 mg  20 mg Oral BID Saundra Shelling, MD   20 mg at 06/14/17 0850  . furosemide (LASIX) injection 40 mg  40 mg Intravenous Q12H Saundra Shelling, MD   40 mg at 06/14/17 1809  . gabapentin (NEURONTIN) capsule 100 mg  100 mg Oral QHS Vaughan Basta, MD   100 mg at 06/13/17 2129  . HYDROcodone-acetaminophen (NORCO) 10-325 MG per tablet 1 tablet  1 tablet Oral Q4H PRN Saundra Shelling, MD   1 tablet at 06/13/17 1837  . ipratropium-albuterol (DUONEB) 0.5-2.5 (3) MG/3ML nebulizer solution 3 mL  3 mL Nebulization Q6H PRN Vaughan Basta, MD   3 mL at 06/13/17 0836  . lamoTRIgine (LAMICTAL) tablet 50 mg  50 mg Oral BID Vaughan Basta, MD   50 mg at 06/14/17 0851  . LORazepam (ATIVAN) tablet 0.5 mg  0.5 mg Oral QHS Pyreddy, Reatha Harps, MD   0.5 mg at 06/13/17 2129  . magnesium hydroxide (MILK OF MAGNESIA) suspension 10 mL  10 mL Oral Daily Pyreddy, Reatha Harps, MD   10 mL at 06/14/17 0852  . magnesium oxide (MAG-OX) tablet 400 mg  400 mg Oral  Daily Pyreddy, Reatha Harps, MD   400 mg at 06/14/17 0851  . metoCLOPramide (REGLAN) tablet 5 mg  5 mg Oral TID AC & HS Pyreddy, Pavan, MD   5 mg at 06/14/17 1810  . metoprolol succinate (TOPROL-XL) 24 hr tablet 50 mg  50 mg Oral Daily Minna Merritts, MD   50 mg at 06/14/17 0850  . nitroGLYCERIN (NITROSTAT) SL tablet 0.4 mg  0.4 mg Sublingual Q5 min PRN Pyreddy, Reatha Harps, MD      . ondansetron (ZOFRAN) injection 4 mg  4 mg Intravenous Q6H PRN Saundra Shelling, MD   4 mg at 06/13/17 1839  . ondansetron (ZOFRAN) tablet 8 mg  8 mg Oral  Q8H PRN Saundra Shelling, MD      . polyethylene glycol powder (GLYCOLAX/MIRALAX) container 255 g  1 Container Oral Daily PRN Hugelmeyer, Alexis, DO      . potassium chloride SA (K-DUR,KLOR-CON) CR tablet 20 mEq  20 mEq Oral BID Saundra Shelling, MD   20 mEq at 06/14/17 0851  . promethazine (PHENERGAN) tablet 12.5 mg  12.5 mg Oral Q6H PRN Saundra Shelling, MD   12.5 mg at 06/13/17 1054  . QUEtiapine (SEROQUEL) tablet 25 mg  25 mg Oral BID Vaughan Basta, MD   25 mg at 06/14/17 0852  . [START ON 06/15/2017] sacubitril-valsartan (ENTRESTO) 24-26 mg per tablet  1 tablet Oral BID Minna Merritts, MD      . sertraline (ZOLOFT) tablet 150 mg  150 mg Oral QHS Saundra Shelling, MD   150 mg at 06/13/17 2128  . sodium chloride flush (NS) 0.9 % injection 3 mL  3 mL Intravenous Q12H Saundra Shelling, MD   3 mL at 06/14/17 0853  . sodium chloride flush (NS) 0.9 % injection 3 mL  3 mL Intravenous PRN Saundra Shelling, MD   3 mL at 06/13/17 1840  . sucralfate (CARAFATE) tablet 1 g  1 g Oral TID WC & HS Vaughan Basta, MD   1 g at 06/14/17 1810     Discharge Medications: Please see discharge summary for a list of discharge medications.  Relevant Imaging Results:  Relevant Lab Results:   Additional Information SSN [id number]  Ross Ludwig, [place]

## 2017-06-14 NOTE — Progress Notes (Signed)
[facility] [place] NAME: Michele Schultz    MR#:  [id number]  DATE OF BIRTH:  Dec 12, 1930  SUBJECTIVE:  CHIEF COMPLAINT:   Chief Complaint  Patient presents with  . Nausea  . Emesis   Came with SOB. Have significant drop in EF%   Complain of nausea with her food for last 2 months which is not changed.  REVIEW OF SYSTEMS:   CONSTITUTIONAL: No fever, fatigue or weakness.  EYES: No blurred or double vision.  EARS, NOSE, AND THROAT: No tinnitus or ear pain.  RESPIRATORY: Has occasional cough, shortness of breath, No wheezing or hemoptysis.  CARDIOVASCULAR: No chest pain,  has orthopnea, edema.  GASTROINTESTINAL: positive for nausea,no vomiting, diarrhea or abdominal pain.  GENITOURINARY: No dysuria, hematuria.  ENDOCRINE: No polyuria, nocturia,  HEMATOLOGY: No anemia, easy bruising or bleeding SKIN: No rash or lesion. MUSCULOSKELETAL: No joint pain or arthritis.   NEUROLOGIC: No tingling, numbness, weakness.  PSYCHIATRY: No anxiety or depression.   ROS  DRUG ALLERGIES:   Allergies  Allergen Reactions  . Citalopram Other (See Comments)    Reaction:  Altered mental status   . Cymbalta [Duloxetine Hcl] Other (See Comments)    Reaction:  Sedative for pt   . Imipramine Other (See Comments)    Reaction:  Unknown   . Proton Pump Inhibitors Other (See Comments)    Reaction:  Unknown   . Venlafaxine Nausea And Vomiting and Other (See Comments)    Reaction:  Dizziness     VITALS:  Blood pressure (!) 93/41, pulse 81, temperature 98.4 F (36.9 C), temperature source Oral, resp. rate 12, height 5\' 4"  (1.626 m), weight 98.6 kg (217 lb 6.4 oz), SpO2 97 %.  PHYSICAL EXAMINATION:   GENERAL:  83 y.o.-year-old patient lying in the bed with no acute distress.  EYES: Pupils equal, round, reactive to light and accommodation. No scleral icterus. Extraocular muscles intact.  HEENT: Head atraumatic, normocephalic. Oropharynx and nasopharynx  clear.  NECK:  Supple, no jugular venous distention. No thyroid enlargement, no tenderness.  LUNGS: Decreased breath sounds bilaterally, bibasilar crepitations heard. No use of accessory muscles of respiration.  CARDIOVASCULAR: S1, S2 normal. No murmurs, rubs, or gallops.  ABDOMEN: Soft, nontender, nondistended. Bowel sounds present. No organomegaly or mass.  EXTREMITIES: Has pedal edema, no cyanosis, or clubbing.  NEUROLOGIC: Cranial nerves II through XII are intact. Muscle strength 5/5 in all extremities. Sensation intact. Gait not checked.  PSYCHIATRIC: The patient is alert and oriented x 3.  SKIN: No obvious rash, lesion, or ulcer.   Physical Exam LABORATORY PANEL:   CBC  Recent Labs Lab 06/13/17 0130  WBC 6.0  HGB 10.7*  HCT 32.7*  PLT 292   ------------------------------------------------------------------------------------------------------------------  Chemistries   Recent Labs Lab 06/12/17 2045  06/14/17 0442  NA 140  < > 143  K 4.3  < > 4.2  CL 111  < > 109  CO2 21*  < > 27  GLUCOSE 123*  < > 100*  BUN 13  < > 14  CREATININE 0.75  < > 0.72  CALCIUM 9.3  < > 9.3  MG 1.8  --   --   AST 26  --   --   ALT 17  --   --   ALKPHOS 74  --   --   BILITOT 0.8  --   --   < > = values in this interval not displayed. ------------------------------------------------------------------------------------------------------------------  Cardiac Enzymes  Recent  Labs Lab 06/13/17 0725 06/13/17 1330  TROPONINI <0.03 <0.03   ------------------------------------------------------------------------------------------------------------------  RADIOLOGY:  Dg Chest 2 View  Result Date: 06/12/2017 CLINICAL DATA:  83 year old female with cough. EXAM: CHEST  2 VIEW COMPARISON:  Chest radiograph dated 11/19/2016 FINDINGS: There is shallow inspiration with bibasilar atelectatic changes. Small bilateral pleural effusions may be present. Pneumonia is not excluded. There is no  pneumothorax. There is mild cardiomegaly. Median sternotomy wires and mechanical cardiac bowel noted. Left pectoral dual lead pacemaker device. No acute osseous pathology. IMPRESSION: 1. Shallow inspiration with bibasilar atelectasis versus infiltrate. Trace bilateral pleural effusions may be present. 2. Mild cardiomegaly.  No vascular congestion or edema. Electronically Signed   By: Anner Crete M.D.   On: 06/12/2017 22:49    ASSESSMENT AND PLAN:   Active Problems:   CHF (congestive heart failure) (HCC)  * ac systolic and chf   Drop in EF   IV lasix   I/O monitoring   Appreciated cardio consult.   Stop CC blockers, add beta blockers.    Entresto.   Plan is to continue diuresis over the weekend and do catheterization to check for ischemia early next week.   Pt's niece will like to be updated with the plan and timing of cath over the weekend.  * CAD   ASA, atorvastatin, metoprolol.  * dilated cardiomyopathy   Hold lisinopril, started enteresto.   Metoprolol.  * complete heart block   Paced rhythm.  * nausea- chronic complain.   On Reglan, famotidine.    Will add sucralfate    I explained patient and her related to that GI may not like to do any procedures until having evaluation for the cardiac issues. So currently we will treat conservatively.  All the records are reviewed and case discussed with Care Management/Social Workerr. Management plans discussed with the patient, family and they are in agreement.  CODE STATUS: full.  TOTAL TIME TAKING CARE OF THIS PATIENT: 35 minutes.   Plan is to continue diuresis over the weekend and do catheterization to check for ischemia early next week.   Pt's niece will like to be updated with the plan and timing of cath over the weekend.  POSSIBLE D/C IN 1-2 DAYS, DEPENDING ON CLINICAL CONDITION.   Vaughan Basta M.D on 06/14/2017   Between 7am to 6pm - Pager - [phone]  After 6pm go to [email or web address] - password [email or web address]  [facility]  Office  [phone]  CC: Primary care physician; [name], MD  Note: This dictation was prepared with Dragon dictation along with smaller phrase technology. Any transcriptional errors that result from this process are unintentional.

## 2017-06-15 DIAGNOSIS — I5023 Acute on chronic systolic (congestive) heart failure: Secondary | ICD-10-CM

## 2017-06-15 LAB — BASIC METABOLIC PANEL
Anion gap: 8 (ref 5–15)
BUN: 18 mg/dL (ref 6–20)
CHLORIDE: 105 mmol/L (ref 101–111)
CO2: 28 mmol/L (ref 22–32)
CREATININE: 0.81 mg/dL (ref 0.44–1.00)
Calcium: 9.3 mg/dL (ref 8.9–10.3)
GFR calc non Af Amer: >60 mL/min (ref >60)
Glucose, Bld: 106 mg/dL — ABNORMAL HIGH (ref 65–99)
Potassium: 3.9 mmol/L (ref 3.5–5.1)
Sodium: 141 mmol/L (ref 135–145)

## 2017-06-15 MED ORDER — FUROSEMIDE 40 MG PO TABS
40.0000 mg | ORAL_TABLET | Freq: Every day | ORAL | Status: DC
  Administered 2017-06-16 – 2017-06-17 (×2): 40 mg via ORAL
  Filled 2017-06-15 (×2): qty 1

## 2017-06-15 MED ORDER — FUROSEMIDE 10 MG/ML IJ SOLN: 20.0000 mg | Freq: Two times a day (BID) | INTRAMUSCULAR | Status: DC

## 2017-06-15 NOTE — Progress Notes (Signed)
Patient Name: Michele Schultz Date of Encounter: 06/15/2017  Primary Cardiologist: [facility] Problem List     Active Problems:   CHF (congestive heart failure) (HCC)     Subjective   Breathing a little better this morning. Able to lay supine now. UOP 3.3 L for the admission. Weight down another 3 pounds. Renal function stable on IV Lasix.   Inpatient Medications    Scheduled Meds: . acidophilus  1 capsule Oral BID  . aspirin EC  81 mg Oral Daily  . atorvastatin  80 mg Oral Daily  . calcium carbonate  1 tablet Oral Daily  . cholecalciferol  1,000 Units Oral Daily  . docusate sodium  100 mg Oral Daily  . enoxaparin (LOVENOX) injection  40 mg Subcutaneous Q24H  . famotidine  20 mg Oral BID  . furosemide  40 mg Intravenous Q12H  . gabapentin  100 mg Oral QHS  . lamoTRIgine  50 mg Oral BID  . LORazepam  0.5 mg Oral QHS  . magnesium hydroxide  10 mL Oral Daily  . magnesium oxide  400 mg Oral Daily  . metoCLOPramide  5 mg Oral TID AC & HS  . metoprolol succinate  50 mg Oral Daily  . potassium chloride SA  20 mEq Oral BID  . QUEtiapine  25 mg Oral BID  . sacubitril-valsartan  1 tablet Oral BID  . sertraline  150 mg Oral QHS  . sodium chloride flush  3 mL Intravenous Q12H  . sucralfate  1 g Oral TID WC & HS   Continuous Infusions: . sodium chloride     PRN Meds: sodium chloride, acetaminophen, HYDROcodone-acetaminophen, ipratropium-albuterol, nitroGLYCERIN, ondansetron (ZOFRAN) IV, ondansetron, polyethylene glycol powder, promethazine, sodium chloride flush   Vital Signs    Vitals:   06/14/17 2001 06/14/17 2131 06/15/17 0422 06/15/17 0627  BP: (!) 89/43 (!) 101/49 (!) 88/46 (!) 120/52  Pulse: (!) 110 (!) 101 (!) 104 60  Resp:   16   Temp: 98.4 F (36.9 C)  97.8 F (36.6 C)   TempSrc: Oral  Oral   SpO2: 95%  94%   Weight:   214 lb 4.8 oz (97.2 kg)   Height:        Intake/Output Summary (Last 24 hours) at 06/15/17 0742 Last data filed at 06/15/17  0120  Gross per 24 hour  Intake                3 ml  Output             1850 ml  Net            -1847 ml   Filed Weights   06/13/17 0109 06/14/17 0503 06/15/17 0422  Weight: 221 lb 3.2 oz (100.3 kg) 217 lb 6.4 oz (98.6 kg) 214 lb 4.8 oz (97.2 kg)    Physical Exam    GEN: Well nourished, well developed, in no acute distress.  HEENT: Grossly normal.  Neck: Supple, JVD elevated ~ 8 cm, no carotid bruits, or masses. Cardiac: RRR, II/VI systolic murmur, no rubs, or gallops. No clubbing, cyanosis, edema.  Radials/DP/PT 2+ and equal bilaterally.  Respiratory:  Diminished breath sounds bilaterally. GI: Soft, nontender, nondistended, BS + x 4. MS: no deformity or atrophy. Skin: warm and dry, no rash. Neuro:  Strength and sensation are intact. Psych: AAOx3.  Normal affect.  Labs    CBC  Recent Labs  06/12/17 2130 06/13/17 0130  WBC 6.5 6.0  HGB 11.2*  10.7*  HCT 34.8* 32.7*  MCV 83.7 83.2  PLT 295 737   Basic Metabolic Panel  Recent Labs  06/12/17 2045  06/14/17 0442 06/15/17 0459  NA 140  < > 143 141  K 4.3  < > 4.2 3.9  CL 111  < > 109 105  CO2 21*  < > 27 28  GLUCOSE 123*  < > 100* 106*  BUN 13  < > 14 18  CREATININE 0.75  < > 0.72 0.81  CALCIUM 9.3  < > 9.3 9.3  MG 1.8  --   --   --   < > = values in this interval not displayed. Liver Function Tests  Recent Labs  06/12/17 2045  AST 26  ALT 17  ALKPHOS 74  BILITOT 0.8  PROT 7.6  ALBUMIN 4.0    Recent Labs  06/12/17 2045  LIPASE 44   Cardiac Enzymes  Recent Labs  06/13/17 0130 06/13/17 0725 06/13/17 1330  TROPONINI <0.03 <0.03 <0.03   BNP Invalid input(s): POCBNP D-Dimer No results for input(s): DDIMER in the last 72 hours. Hemoglobin A1C No results for input(s): HGBA1C in the last 72 hours. Fasting Lipid Panel No results for input(s): CHOL, HDL, LDLCALC, TRIG, CHOLHDL, LDLDIRECT in the last 72 hours. Thyroid Function Tests No results for input(s): TSH, T4TOTAL, T3FREE, THYROIDAB in  the last 72 hours.  Invalid input(s): FREET3  Telemetry    V paced - Personally Reviewed  ECG    n/a - Personally Reviewed  Radiology    No results found.  Cardiac Studies   TTE 06/13/2017: Study Conclusions  - Left ventricle: The cavity size was moderately dilated. Systolic function was severely reduced. The estimated ejection fraction was in the range of 20% to 25%. Diffuse hypokinesis. - Aortic valve: There was trivial regurgitation. Valve area (VTI): 1.06 cm^2. Valve area (Vmax): 0.89 cm^2. Valve area (Vmean): 0.95 cm^2. - Mitral valve: Calcified annulus. Mildly thickened leaflets . There was severe regurgitation. - Left atrium: The atrium was mildly dilated. - Right atrium: The atrium was mildly dilated. - Tricuspid valve: There was moderate regurgitation.  Impressions:  - The right ventricular systolic pressure was increased consistent with moderate pulmonary hypertension.  Patient Profile     83 y.o. female with history of CAD (s/p CABG in 10/2011 with SVG-LAD and SVG-OM1 with low-risk NST in 08/2015), severe AS (s/p AVR in 2012), chronic diastolic CHF, CHB (s/p PPM placement in 10/2011), PAT, HTN, HLD, Type 2 DM, and persistent nausea (thought secondary to gastroparesis) who was admitted for the evaluation of CHF.  Assessment & Plan    1. Acute systolic CHF/acute on chronic diastolic CHF/dilated cardiomyopathy: -Newly reduced EF as above -Remains volume overloaded -Will need a couple more days of diuresis followed by [facility] first of the following week if patient and family would want -Continue IV Lasix 20 mg bid with KCl repletion  -Toprol XL as above -ACEi held as of AM of 7/11,  -Starting Entresto this morning 7/13 (has completed 36 hour ACEi washout), monitor for hypotension -Consider addition of spironolactone prior to discharge -Strict Is and Os -Daily weights  2. CAD: -S/p CABG in 10/2011 with SVG-LAD and SVG-OM1 with low-risk NST  in 08/2015 -No anginal symptoms -Troponins negative -ASA and statin -Will need ischemic evaluation as above prior to discharge if family and patient would want  3. AS: -As above -Ischemic evaluation as above  4. Complete heart block: -S/p PPM -Device appears to be functioning appropriately  5. Decreased appetite/gastroparesis: -Albumin 4.0 at admission -Per IM  Signed, Christell Faith, PA-C [facility] Pager: [phone] 06/15/2017, 7:42 AM

## 2017-06-15 NOTE — Clinical Social Work Note (Addendum)
Clinical Social Work Assessment  Patient Details  Name: Michele Schultz MRN: [id number] Date of Birth: 10-01-1931  Date of referral:  06/13/17               Reason for consult:  Facility Placement                Permission sought to share information with:  Family Supports, Customer service manager Permission granted to share information::  Yes, Verbal Permission Granted  Name::     Knight,Shelia Niece [phone]  [phone]   Agency::  SNF admissions  Relationship::     Contact Information:     Housing/Transportation Living arrangements for the past 2 months:  [place] of Information:  Patient Patient Interpreter Needed:  None Criminal Activity/Legal Involvement Pertinent to Current Situation/Hospitalization:  No - Comment as needed Significant Relationships:  Other Family Members Lives with:  Facility Resident Do you feel safe going back to the place where you live?  Yes Need for family participation in patient care:  Yes (Comment)  Care giving concerns:  Patient states she does not have any concerns about returning back to SNF.   Social Worker assessment / plan:  CSW met with patient she is alert and oriented x3.  Patient is a long term care resident at [facility], patient has been there for approximately 1.5 months.  Patient states she would like to go to a different level of care like an ALF, or independent living, but she states she has been at both settings and she realizes she needs SNF level of care.  Patient discussed with CSW her family life before her husband passed away she also talked about living in [place] and her house that she used to own.  Patient was very talkative and pleasant, patient states she is not sure how long she will be in the hospital, but she hopes it is not too long.  Patient was informed about role of CSW and process for helping her get back to SNF.  Patient continued to talk about her past and how active  she used to be and expressed it is hard for her to not be able to walk like she used to.  Patient stated that her niece helps take care of her and she is lucky that she has her.  Patient did not express any other questions or concerns about returning back to SNF.  CSW completed geriatric depression scale, and patient has some mild depression she scored 5 out of 15  Employment status:  Retired Forensic scientist:  Medicare PT Recommendations:  Not assessed at this time Information / Referral to community resources:     Patient/Family's Response to care:  Patient and family are agreeable to having patient return back to SNF.  Patient/Family's Understanding of and Emotional Response to Diagnosis, Current Treatment, and Prognosis:  Patient expressed disappointment that she is unable to live on her own again, and even though she would rather not be in SNF she understands that is the level of care and assistance that she needs.  Patient is hopeful she will not have to be in the hospital very long.  Emotional Assessment Appearance:  Appears stated age Attitude/Demeanor/Rapport:    Affect (typically observed):  Appropriate, Calm, Pleasant, Stable Orientation:  Oriented to Self, Oriented to Place, Oriented to Situation Alcohol / Substance use:  Not Applicable Psych involvement (Current and /or in the community):  No (Comment)  Discharge Needs  Concerns to be addressed:  Readmission within the last 30 days:  No Current discharge risk:  None Barriers to Discharge:  Continued Medical Work up   Anell Barr 06/15/2017, 12:34 PM

## 2017-06-15 NOTE — Clinical Social Work Note (Signed)
Patient is a long term care resident at [facility].  Plan for patient to return once she is medically ready for discharge and orders have been received.  Jones Broom. Kramer, MSW, [place]  06/15/2017 2:23 PM

## 2017-06-15 NOTE — Plan of Care (Signed)
Problem: Spiritual Needs Goal: Ability to function at adequate level Outcome: Not Progressing Patient is still very weak and unable to ambulate. Will continue to monitor.

## 2017-06-15 NOTE — Progress Notes (Signed)
[facility] [facility] [place] NAME: Michele Schultz    MR#:  [id number]  DATE OF BIRTH:  09-08-1931  SUBJECTIVE:  CHIEF COMPLAINT:   Chief Complaint  Patient presents with  . Nausea  . Emesis   Came with SOB.   Have significant drop in EF%   Complain of nausea with her food for last 2 months which is not changed.   Have significant diuresis, still overloaded, but feels better today.  REVIEW OF SYSTEMS:   CONSTITUTIONAL: No fever, fatigue or weakness.  EYES: No blurred or double vision.  EARS, NOSE, AND THROAT: No tinnitus or ear pain.  RESPIRATORY: Has occasional cough, shortness of breath, No wheezing or hemoptysis.  CARDIOVASCULAR: No chest pain,  has orthopnea, edema.  GASTROINTESTINAL: positive for nausea,no vomiting, diarrhea or abdominal pain.  GENITOURINARY: No dysuria, hematuria.  ENDOCRINE: No polyuria, nocturia,  HEMATOLOGY: No anemia, easy bruising or bleeding SKIN: No rash or lesion. MUSCULOSKELETAL: No joint pain or arthritis.   NEUROLOGIC: No tingling, numbness, weakness.  PSYCHIATRY: No anxiety or depression.   ROS  DRUG ALLERGIES:   Allergies  Allergen Reactions  . Citalopram Other (See Comments)    Reaction:  Altered mental status   . Cymbalta [Duloxetine Hcl] Other (See Comments)    Reaction:  Sedative for pt   . Imipramine Other (See Comments)    Reaction:  Unknown   . Proton Pump Inhibitors Other (See Comments)    Reaction:  Unknown   . Venlafaxine Nausea And Vomiting and Other (See Comments)    Reaction:  Dizziness     VITALS:  Blood pressure (!) 98/57, pulse 90, temperature 98.4 F (36.9 C), resp. rate 18, height 5\' 4"  (1.626 m), weight 97.2 kg (214 lb 4.8 oz), SpO2 97 %.  PHYSICAL EXAMINATION:   GENERAL:  83 y.o.-year-old patient lying in the bed with no acute distress.  EYES: Pupils equal, round, reactive to light and accommodation. No scleral icterus. Extraocular muscles intact.  HEENT: Head  atraumatic, normocephalic. Oropharynx and nasopharynx clear.  NECK:  Supple, no jugular venous distention. No thyroid enlargement, no tenderness.  LUNGS: Decreased breath sounds bilaterally, bibasilar crepitations heard. No use of accessory muscles of respiration.  CARDIOVASCULAR: S1, S2 normal. No murmurs, rubs, or gallops.  ABDOMEN: Soft, nontender, nondistended. Bowel sounds present. No organomegaly or mass.  EXTREMITIES: Has pedal edema, no cyanosis, or clubbing.  NEUROLOGIC: Cranial nerves II through XII are intact. Muscle strength 4/5 in all extremities. Sensation intact. Gait not checked.  PSYCHIATRIC: The patient is alert and oriented x 3.  SKIN: No obvious rash, lesion, or ulcer.   Physical Exam LABORATORY PANEL:   CBC  Recent Labs Lab 06/13/17 0130  WBC 6.0  HGB 10.7*  HCT 32.7*  PLT 292   ------------------------------------------------------------------------------------------------------------------  Chemistries   Recent Labs Lab 06/12/17 2045  06/15/17 0459  NA 140  < > 141  K 4.3  < > 3.9  CL 111  < > 105  CO2 21*  < > 28  GLUCOSE 123*  < > 106*  BUN 13  < > 18  CREATININE 0.75  < > 0.81  CALCIUM 9.3  < > 9.3  MG 1.8  --   --   AST 26  --   --   ALT 17  --   --   ALKPHOS 74  --   --   BILITOT 0.8  --   --   < > = values in  this interval not displayed. ------------------------------------------------------------------------------------------------------------------  Cardiac Enzymes  Recent Labs Lab 06/13/17 0725 06/13/17 1330  TROPONINI <0.03 <0.03   ------------------------------------------------------------------------------------------------------------------  RADIOLOGY:  No results found.  ASSESSMENT AND PLAN:   Active Problems:   CHF (congestive heart failure) (HCC)  * ac systolic and chf   Drop in EF   IV lasix- changed from 40 mg IV BID to 20 MG iv BID due to hypotension.   I/O monitoring- > 3 ltr negative fluid balance.    Appreciated cardio consult.   Stop CC blockers, add beta blockers.    Entresto.   Plan is to continue diuresis over the weekend and do catheterization to check for ischemia early next week.   Pt's niece will like to be updated with the plan and timing of cath over the weekend.  * CAD   ASA, atorvastatin, metoprolol.  * dilated cardiomyopathy   Hold lisinopril, started enteresto.   Metoprolol.   May add spironolactone if BP allows before discharge.  * complete heart block   Paced rhythm.  * nausea- chronic complain.   On Reglan, famotidine.    Will add sucralfate    I explained patient and her related to that GI may not like to do any procedures until having evaluation for the cardiac issues. So currently we will treat conservatively.  All the records are reviewed and case discussed with Care Management/Social Workerr. Management plans discussed with the patient, family and they are in agreement.  CODE STATUS: full.  TOTAL TIME TAKING CARE OF THIS PATIENT: 35 minutes.   Plan is to continue diuresis over the weekend and do catheterization to check for ischemia early next week.   Pt's niece will like to be updated with the plan and timing of cath over the weekend.  POSSIBLE D/C IN 3-4 DAYS, DEPENDING ON CLINICAL CONDITION.   Vaughan Basta M.D on 06/15/2017   Between 7am to 6pm - Pager - [phone]  After 6pm go to [email or web address] - password [email or web address] [facility]  Office  [phone]  CC: Primary care physician; [name], MD  Note: This dictation was prepared with Dragon dictation along with smaller phrase technology. Any transcriptional errors that result from this process are unintentional.

## 2017-06-15 NOTE — Care Management (Signed)
It appears patient will be started on Entresto at discharge.  patient will discharge back to skilled nursing so will not anticipate issues paying for this medication.   Will require a few more day of diuresis before heart cath- which is now scheduled for 7/16.

## 2017-06-16 DIAGNOSIS — I25709 Atherosclerosis of coronary artery bypass graft(s), unspecified, with unspecified angina pectoris: Secondary | ICD-10-CM

## 2017-06-16 DIAGNOSIS — I255 Ischemic cardiomyopathy: Secondary | ICD-10-CM | POA: Diagnosis present

## 2017-06-16 DIAGNOSIS — I5041 Acute combined systolic (congestive) and diastolic (congestive) heart failure: Secondary | ICD-10-CM

## 2017-06-16 DIAGNOSIS — I442 Atrioventricular block, complete: Secondary | ICD-10-CM

## 2017-06-16 LAB — BASIC METABOLIC PANEL
Anion gap: 7 (ref 5–15)
BUN: 17 mg/dL (ref 6–20)
CHLORIDE: 106 mmol/L (ref 101–111)
CO2: 26 mmol/L (ref 22–32)
Calcium: 9.2 mg/dL (ref 8.9–10.3)
Creatinine, Ser: 0.7 mg/dL (ref 0.44–1.00)
GFR calc Af Amer: >60 mL/min (ref >60)
GFR calc non Af Amer: >60 mL/min (ref >60)
GLUCOSE: 112 mg/dL — AB (ref 65–99)
POTASSIUM: 4 mmol/L (ref 3.5–5.1)
Sodium: 139 mmol/L (ref 135–145)

## 2017-06-16 LAB — MAGNESIUM: Magnesium: 2.1 mg/dL (ref 1.7–2.4)

## 2017-06-16 NOTE — Progress Notes (Signed)
Advanced Care Plan.  Purpose of Encounter: Code status. Parties in Attendance: the patient, her nephew (POA), me. Patient's Decisional Capacity: No. Medical Story: The patient was admitted forAcute combined systolic and diastolic CHF, Ischemic Cardiomyopathy. She has been treated with diuretics with some improvement. I discussed with the patient CODE STATUS the patient's nephew. Per her nephew, the patient's CODE STATUS is DO NOT RESUSCITATE. I will change from full code to DO NOT RESUSCITATE.   Code Status: DO NOT RESUSCITATE  Time spent discussing advance care planning: 17 minutes.

## 2017-06-16 NOTE — Progress Notes (Addendum)
[name] at [place] NAME: Michele Schultz    MR#:  [id number]  DATE OF BIRTH:  1931-08-14  SUBJECTIVE:  CHIEF COMPLAINT:   Chief Complaint  Patient presents with  . Nausea  . Emesis   Came with SOB.   Have significant drop in EF%   Complain of nausea with her food for last 2 months which is not changed.  The patient has no complaints REVIEW OF SYSTEMS:   CONSTITUTIONAL: No fever, fatigue or weakness.  EYES: No blurred or double vision.  EARS, NOSE, AND THROAT: No tinnitus or ear pain.  RESPIRATORY: Has occasional cough, shortness of breath, No wheezing or hemoptysis.  CARDIOVASCULAR: No chest pain,  has orthopnea, edema.  GASTROINTESTINAL: no nausea,no vomiting, diarrhea or abdominal pain.  GENITOURINARY: No dysuria, hematuria.  ENDOCRINE: No polyuria, nocturia,  HEMATOLOGY: No anemia, easy bruising or bleeding SKIN: No rash or lesion. MUSCULOSKELETAL: No joint pain or arthritis.   NEUROLOGIC: No tingling, numbness, weakness.  PSYCHIATRY: No anxiety or depression.   ROS  DRUG ALLERGIES:   Allergies  Allergen Reactions  . Citalopram Other (See Comments)    Reaction:  Altered mental status   . Cymbalta [Duloxetine Hcl] Other (See Comments)    Reaction:  Sedative for pt   . Imipramine Other (See Comments)    Reaction:  Unknown   . Proton Pump Inhibitors Other (See Comments)    Reaction:  Unknown   . Venlafaxine Nausea And Vomiting and Other (See Comments)    Reaction:  Dizziness     VITALS:  Blood pressure (!) 91/47, pulse 98, temperature 98.2 F (36.8 C), temperature source Oral, resp. rate 16, height 5\' 4"  (1.626 m), weight 217 lb 3.2 oz (98.5 kg), SpO2 99 %.  PHYSICAL EXAMINATION:   GENERAL:  83 y.o.-year-old patient lying in the bed with no acute distress. Obese. EYES: Pupils equal, round, reactive to light and accommodation. No scleral icterus. Extraocular muscles intact.  HEENT: Head atraumatic, normocephalic.  Oropharynx and nasopharynx clear.  NECK:  Supple, no jugular venous distention. No thyroid enlargement, no tenderness.  LUNGS: Decreased breath sounds bilaterally, bibasilar crepitations heard. No use of accessory muscles of respiration.  CARDIOVASCULAR: S1, S2 normal. No murmurs, rubs, or gallops.  ABDOMEN: Soft, nontender, nondistended. Bowel sounds present. No organomegaly or mass.  EXTREMITIES: Has pedal edema, no cyanosis, or clubbing.  NEUROLOGIC: Cranial nerves II through XII are intact. Muscle strength 4/5 in all extremities. Sensation intact. Gait not checked.  PSYCHIATRIC: The patient is alert and oriented x 3.  SKIN: No obvious rash, lesion, or ulcer.   Physical Exam LABORATORY PANEL:   CBC  Recent Labs Lab 06/13/17 0130  WBC 6.0  HGB 10.7*  HCT 32.7*  PLT 292   ------------------------------------------------------------------------------------------------------------------  Chemistries   Recent Labs Lab 06/12/17 2045  06/16/17 0531  NA 140  < > 139  K 4.3  < > 4.0  CL 111  < > 106  CO2 21*  < > 26  GLUCOSE 123*  < > 112*  BUN 13  < > 17  CREATININE 0.75  < > 0.70  CALCIUM 9.3  < > 9.2  MG 1.8  --  2.1  AST 26  --   --   ALT 17  --   --   ALKPHOS 74  --   --   BILITOT 0.8  --   --   < > = values in this interval not displayed. ------------------------------------------------------------------------------------------------------------------  Cardiac Enzymes  Recent Labs Lab 06/13/17 0725 06/13/17 1330  TROPONINI <0.03 <0.03   ------------------------------------------------------------------------------------------------------------------  RADIOLOGY:  No results found.  ASSESSMENT AND PLAN:   Principal Problem:   Acute combined systolic and diastolic CHF, NYHA class 3 (HCC) Active Problems:   Complete heart block (HCC)   S/P CABG x 2   Aortic stenosis - s/p AVR   Coronary artery disease involving coronary bypass graft of native heart with  angina pectoris (HCC)   S/P AVR (aortic valve replacement)   Ischemic cardiomyopathy  * Acute combined systolic and diastolic CHF, NYHA class 3 ([place]), LV EF: 20% -   25%   Drop in EF   IV lasix- changed from 40 mg IV BID to 20 MG iv BID due to hypotension.   I/O monitoring- > 3 ltr negative fluid balance.   Appreciated cardio consult.   Stop CC blockers, added beta blockers and Converted from ACE inhibitor to  [place].   Dr. Fletcher Anon discussed the potential of right left heart catheterization with the patient, he also called and left message with the patient's niece. At this point, given the patient's poor functional status, question the benefit of invasive evaluation at this point per Dr. Ellyn Hack.  * CAD   ASA, atorvastatin, metoprolol.  * dilated cardiomyopathy   Hold lisinopril, started enteresto.   Metoprolol.   May add spironolactone if BP allows before discharge.  * complete heart block   Paced rhythm.  * nausea- chronic complain.   On Reglan, famotidine. added sucralfate Dr. Anselm Jungling explained patient and her related to that GI may not like to do any procedures until having evaluation for the cardiac issues. So currently we will treat conservatively.  I called her nephew, Mr. Waldron Session, updated the patient's condition. All the records are reviewed and case discussed with Care Management/Social Workerr. Management plans discussed with the patient, family and they are in agreement.  CODE STATUS: DNR. TOTAL TIME TAKING CARE OF THIS PATIENT: 35 minutes.   Plan is to continue diuresis over the weekend and do catheterization to check for ischemia early next week.   Pt's niece will like to be updated with the plan and timing of cath over the weekend.  POSSIBLE D/C IN 2 DAYS, DEPENDING ON CLINICAL CONDITION.   Demetrios Loll M.D on 06/16/2017   Between 7am to 6pm - Pager - [phone]  After 6pm go to [email or web address] - password [email or web address] [facility]  Office   [phone]  CC: Primary care physician; [name], MD  Note: This dictation was prepared with Dragon dictation along with smaller phrase technology. Any transcriptional errors that result from this process are unintentional.

## 2017-06-16 NOTE — Progress Notes (Signed)
Patient Name: RUSHIE BRAZEL Date of Encounter: 06/16/2017  Primary Cardiologist: Rockey Situ / [facility] Problem List     Principal Problem:   Acute combined systolic and diastolic CHF, NYHA class 3 (HCC) Active Problems:   Coronary artery disease involving coronary bypass graft of native heart with angina pectoris (HCC)   Ischemic cardiomyopathy   Complete heart block (HCC)   Aortic stenosis - s/p AVR   S/P CABG x 2   S/P AVR (aortic valve replacement)    Subjective   Very tired & fatigued today.  Too weak to feed herself. Notes intermitted Chest discomfort - (friend noted that she indicated CP while lying @ rest). Breathing seems stable - did not complain of dyspnea  Inpatient Medications    Scheduled Meds: . acidophilus  1 capsule Oral BID  . aspirin EC  81 mg Oral Daily  . atorvastatin  80 mg Oral Daily  . calcium carbonate  1 tablet Oral Daily  . cholecalciferol  1,000 Units Oral Daily  . docusate sodium  100 mg Oral Daily  . enoxaparin (LOVENOX) injection  40 mg Subcutaneous Q24H  . famotidine  20 mg Oral BID  . furosemide  40 mg Oral Daily  . gabapentin  100 mg Oral QHS  . lamoTRIgine  50 mg Oral BID  . LORazepam  0.5 mg Oral QHS  . magnesium hydroxide  10 mL Oral Daily  . magnesium oxide  400 mg Oral Daily  . metoCLOPramide  5 mg Oral TID AC & HS  . metoprolol succinate  50 mg Oral Daily  . potassium chloride SA  20 mEq Oral BID  . QUEtiapine  25 mg Oral BID  . sacubitril-valsartan  1 tablet Oral BID  . sertraline  150 mg Oral QHS  . sodium chloride flush  3 mL Intravenous Q12H  . sucralfate  1 g Oral TID WC & HS   Continuous Infusions: . sodium chloride     PRN Meds: sodium chloride, acetaminophen, HYDROcodone-acetaminophen, ipratropium-albuterol, nitroGLYCERIN, ondansetron (ZOFRAN) IV, ondansetron, polyethylene glycol powder, promethazine, sodium chloride flush   Vital Signs    Vitals:   06/16/17 0413 06/16/17 0414 06/16/17 0500 06/16/17  0800  BP: (!) 90/40 (!) 98/45  (!) 91/47  Pulse: 95 98    Resp: 16   16  Temp: 98.4 F (36.9 C)   98.2 F (36.8 C)  TempSrc: Oral   Oral  SpO2: 94%   99%  Weight:   217 lb 3.2 oz (98.5 kg)   Height:        Intake/Output Summary (Last 24 hours) at 06/16/17 0854 Last data filed at 06/16/17 0210  Gross per 24 hour  Intake              600 ml  Output              500 ml  Net              100 ml   Filed Weights   06/14/17 0503 06/15/17 0422 06/16/17 0500  Weight: 217 lb 6.4 oz (98.6 kg) 214 lb 4.8 oz (97.2 kg) 217 lb 3.2 oz (98.5 kg)    Physical Exam    General appearance: alert, cooperative and no distress - tired & frail appearing. Neck: no adenopathy, no carotid bruit, supple, symmetrical, trachea midline and JVD mildly elevated. Lungs: Normal breathing. Mildly diminished nasal breath sounds bilaterally. Heart: RRR, normal S1 and S2. 2/6 SEM at RUSB. No other  M/R/G. Distant heart sounds Abdomen: soft, non-tender; bowel sounds normal; no masses,  no organomegaly Extremities: extremities normal, atraumatic, no cyanosis or edema Pulses: 2+ and symmetric Neurologic: Mental status: Alert, oriented - but drowsy & ~lethargic. Pleasant mood and affect.   Labs    CBC No results for input(s): WBC, NEUTROABS, HGB, HCT, MCV, PLT in the last 72 hours. Basic Metabolic Panel  Recent Labs  06/15/17 0459 06/16/17 0531  NA 141 139  K 3.9 4.0  CL 105 106  CO2 28 26  GLUCOSE 106* 112*  BUN 18 17  CREATININE 0.81 0.70  CALCIUM 9.3 9.2  MG  --  2.1   Liver Function Tests No results for input(s): AST, ALT, ALKPHOS, BILITOT, PROT, ALBUMIN in the last 72 hours. No results for input(s): LIPASE, AMYLASE in the last 72 hours. Cardiac Enzymes  Recent Labs  06/13/17 1330  TROPONINI <0.03   BNP Invalid input(s): POCBNP D-Dimer No results for input(s): DDIMER in the last 72 hours. Hemoglobin A1C No results for input(s): HGBA1C in the last 72 hours. Fasting Lipid Panel No  results for input(s): CHOL, HDL, LDLCALC, TRIG, CHOLHDL, LDLDIRECT in the last 72 hours. Thyroid Function Tests No results for input(s): TSH, T4TOTAL, T3FREE, THYROIDAB in the last 72 hours.  Invalid input(s): FREET3  Telemetry    V paced with some NSR (aberrantly conducted beats) - Not Vtach - Personally Reviewed  ECG    No new EKG - Personally Reviewed  Radiology    No results found.  Cardiac Studies    TTE 06/13/2017:Moderate LV dilation. Severely reduced EF of 20-25% with diffuse hypokinesis. Severe MR. Moderate pulmonary hypertension. Aortic stenosis (not quantified, but with aortic valve area of 1.06 cm - likely mild)   Patient Profile     83 y.o. female with history of CAD (s/p CABG in 10/2011 with SVG-LAD and SVG-OM1 with low-risk NST in 08/2015), severe AS (s/p AVR in 2012), chronic diastolic CHF, CHB (s/p PPM placement in 10/2011), PAT, HTN, HLD, Type 2 DM, and persistent nausea (thought secondary to gastroparesis) who was admitted for the evaluation of CHF.  Assessment & Plan  Principal Problem:   Acute combined systolic and diastolic CHF, NYHA class 3 (HCC) Active Problems:   Coronary artery disease involving coronary bypass graft of native heart with angina pectoris (HCC)   Ischemic cardiomyopathy   Complete heart block (HCC)   Aortic stenosis - s/p AVR   S/P CABG x 2   S/P AVR (aortic valve replacement)  Principal Problem:   Acute combined systolic and diastolic CHF, NYHA class 3 ([place]) / Likely Ischemic Cardiomyopathy- mildly reduced EF was noted on echo.  Convert her from IV to oral Lasix yesterday. Urine output not as brisk. (I =O) -  May well be euvolemic - keep I=O.  Convert from ACE inhibitor to contrast to along with current dose of Toprol. Blood pressures have remained borderline low. Unable to initiate spironolactone.  If pressures continue to be low, would likely need to consider reducing beta blocker dose, however this is less than ideal given her  tachycardia. -? If this is the reason why she seems so tired.  If renal function remains stable, consider digoxin    Coronary artery disease involving coronary bypass graft of native heart with angina pectoris ([place]); S/P CABG x 2 (10/2011 with SVG-LAD and SVG-OM1 with low-risk NST in 08/2015)  Troponin negative  Dr. Fletcher Anon discussed the potential of right left heart catheterization with the patient, he also called  and left message with the patient's niece. At this point, given the patient's poor functional status, question the benefit of invasive evaluation at this point.  Will defer decision-making on this for now. Can discuss with patient and family - but would prefer for this to be a conversation with Dr. Fletcher Anon on Monday (with family present) -- at baseline, she does not walk.    Perhaps the only impetus for considering ischemic evaluation would be CP or recurrent HF.      Complete heart block (HCC) - PPM in place; also has history of A. fib.   Aortic stenosis - S/P AVR (aortic valve replacement) - roughly mild aortic stenosis by echo; soft murmur on exam.    Patient has significantly decreased appetite and poor by mouth intake and failure to thrive. Overall poor functional status. Limits options for aggressive management.  Recommend readdressing CODE STATUS discussion, the patient has DO NOT RESUSCITATE listed in her problem list.   Glenetta Hew, M.D., M.S. Interventional Cardiologist   Pager # [phone] Phone # [phone] [address] [place], [place] [address]

## 2017-06-16 NOTE — Progress Notes (Signed)
PT Cancellation Note  Patient Details Name: Michele Schultz MRN: [id number] DOB: [birth year]   Cancelled Treatment:    Reason Eval/Treat Not Completed: Fatigue/lethargy limiting ability to participate; RN cleared pt for PT eval however pt declined stating she was "too sick" to do anything but she would try tomorrow if she felt better.  Will continue to follow as appropriate.  Allia Wiltsey A Melida Northington, PT 06/16/2017, 3:43 PM

## 2017-06-17 DIAGNOSIS — R627 Adult failure to thrive: Secondary | ICD-10-CM

## 2017-06-17 DIAGNOSIS — I952 Hypotension due to drugs: Secondary | ICD-10-CM

## 2017-06-17 LAB — BASIC METABOLIC PANEL
ANION GAP: 6 (ref 5–15)
BUN: 16 mg/dL (ref 6–20)
CO2: 27 mmol/L (ref 22–32)
Calcium: 9.1 mg/dL (ref 8.9–10.3)
Chloride: 105 mmol/L (ref 101–111)
Creatinine, Ser: 0.72 mg/dL (ref 0.44–1.00)
Glucose, Bld: 119 mg/dL — ABNORMAL HIGH (ref 65–99)
POTASSIUM: 4 mmol/L (ref 3.5–5.1)
SODIUM: 138 mmol/L (ref 135–145)

## 2017-06-17 MED ORDER — DOCUSATE SODIUM 100 MG PO CAPS
100.0000 mg | ORAL_CAPSULE | Freq: Two times a day (BID) | ORAL | Status: DC
  Administered 2017-06-17 – 2017-06-25 (×17): 100 mg via ORAL
  Filled 2017-06-17 (×16): qty 1

## 2017-06-17 MED ORDER — BISACODYL 5 MG PO TBEC
5.0000 mg | DELAYED_RELEASE_TABLET | Freq: Every day | ORAL | Status: DC | PRN
Start: 2017-06-17 — End: 2017-06-26
  Administered 2017-06-17: 5 mg via ORAL
  Filled 2017-06-17: qty 1

## 2017-06-17 MED ORDER — FUROSEMIDE 20 MG PO TABS
20.0000 mg | ORAL_TABLET | Freq: Two times a day (BID) | ORAL | Status: DC
  Administered 2017-06-18: 20 mg via ORAL
  Filled 2017-06-17: qty 1

## 2017-06-17 MED ORDER — METOPROLOL SUCCINATE ER 25 MG PO TB24
25.0000 mg | ORAL_TABLET | Freq: Every day | ORAL | Status: DC
  Administered 2017-06-18 – 2017-06-21 (×3): 25 mg via ORAL
  Filled 2017-06-17 (×3): qty 1

## 2017-06-17 NOTE — Progress Notes (Signed)
[facility] [place] NAME: Michele Schultz    MR#:  [id number]  DATE OF BIRTH:  14-Jul-1931  SUBJECTIVE:  CHIEF COMPLAINT:   Chief Complaint  Patient presents with  . Nausea  . Emesis   Came with SOB.   Have significant drop in EF%   Complain of nausea with her food for last 2 months which is not changed.  The patient has no complaints REVIEW OF SYSTEMS:   CONSTITUTIONAL: generalized weakness.  EYES: No blurred or double vision.  EARS, NOSE, AND THROAT: No tinnitus or ear pain.  RESPIRATORY: No cough or shortness of breath, No wheezing or hemoptysis.  CARDIOVASCULAR: No chest pain,  has orthopnea, edema.  GASTROINTESTINAL: no nausea,no vomiting, diarrhea or abdominal pain.  GENITOURINARY: No dysuria, hematuria.  ENDOCRINE: No polyuria, nocturia,  HEMATOLOGY: No anemia, easy bruising or bleeding SKIN: No rash or lesion. MUSCULOSKELETAL: No joint pain or arthritis.   NEUROLOGIC: No tingling, numbness, weakness.  PSYCHIATRY: No anxiety or depression.   ROS  DRUG ALLERGIES:   Allergies  Allergen Reactions  . Citalopram Other (See Comments)    Reaction:  Altered mental status   . Cymbalta [Duloxetine Hcl] Other (See Comments)    Reaction:  Sedative for pt   . Imipramine Other (See Comments)    Reaction:  Unknown   . Proton Pump Inhibitors Other (See Comments)    Reaction:  Unknown   . Venlafaxine Nausea And Vomiting and Other (See Comments)    Reaction:  Dizziness     VITALS:  Blood pressure (!) 86/53, pulse 88, temperature 98.2 F (36.8 C), temperature source Axillary, resp. rate 20, height 5\' 4"  (1.626 m), weight 222 lb 6.4 oz (100.9 kg), SpO2 96 %.  PHYSICAL EXAMINATION:   GENERAL:  83 y.o.-year-old patient lying in the bed with no acute distress. Obese. EYES: Pupils equal, round, reactive to light and accommodation. No scleral icterus. Extraocular muscles intact.  HEENT: Head atraumatic, normocephalic. Oropharynx and  nasopharynx clear.  NECK:  Supple, no jugular venous distention. No thyroid enlargement, no tenderness.  LUNGS: Decreased breath sounds bilaterally, bibasilar crepitations heard. No use of accessory muscles of respiration.  CARDIOVASCULAR: S1, S2 normal. No murmurs, rubs, or gallops.  ABDOMEN: Soft, nontender, nondistended. Bowel sounds present. No organomegaly or mass.  EXTREMITIES: Has pedal edema, no cyanosis, or clubbing.  NEUROLOGIC: Cranial nerves II through XII are intact. Muscle strength 3/5 in all extremities. Sensation intact. Gait not checked.  PSYCHIATRIC: The patient is alert and oriented x 3.  SKIN: No obvious rash, lesion, or ulcer.   Physical Exam LABORATORY PANEL:   CBC  Recent Labs Lab 06/13/17 0130  WBC 6.0  HGB 10.7*  HCT 32.7*  PLT 292   ------------------------------------------------------------------------------------------------------------------  Chemistries   Recent Labs Lab 06/12/17 2045  06/16/17 0531 06/17/17 0505  NA 140  < > 139 138  K 4.3  < > 4.0 4.0  CL 111  < > 106 105  CO2 21*  < > 26 27  GLUCOSE 123*  < > 112* 119*  BUN 13  < > 17 16  CREATININE 0.75  < > 0.70 0.72  CALCIUM 9.3  < > 9.2 9.1  MG 1.8  --  2.1  --   AST 26  --   --   --   ALT 17  --   --   --   ALKPHOS 74  --   --   --   BILITOT  0.8  --   --   --   < > = values in this interval not displayed. ------------------------------------------------------------------------------------------------------------------  Cardiac Enzymes  Recent Labs Lab 06/13/17 0725 06/13/17 1330  TROPONINI <0.03 <0.03   ------------------------------------------------------------------------------------------------------------------  RADIOLOGY:  No results found.  ASSESSMENT AND PLAN:   Principal Problem:   Acute combined systolic and diastolic CHF, NYHA class 3 (HCC) Active Problems:   Complete heart block (HCC)   S/P CABG x 2   Aortic stenosis - s/p AVR   Coronary artery  disease involving coronary bypass graft of native heart with angina pectoris (HCC)   S/P AVR (aortic valve replacement)   Ischemic cardiomyopathy  * Acute combined systolic and diastolic CHF, NYHA class 3 ([place]), LV EF: 20% -   25%   Drop in EF   IV lasix- changed from 40 mg IV BID to 20 MG iv BID due to hypotension.   I/O monitoring- > 3 ltr negative fluid balance.   Appreciated cardio consult.   Stopped CC blockers, added beta blockers and Converted from ACE inhibitor to  Praxair.   Dr. Fletcher Anon discussed the potential of right left heart catheterization with the patient, he also called and left message with the patient's niece. At this point, given the patient's poor functional status, question the benefit of invasive evaluation at this point per Dr. Ellyn Hack.  * CAD   ASA, atorvastatin, metoprolol.  * dilated cardiomyopathy   Hold lisinopril, started enteresto.   Metoprolol.   May add spironolactone if BP allows before discharge.  * complete heart block   Paced rhythm.  * nausea- chronic complain.   On Reglan, famotidine. added sucralfate Dr. Anselm Jungling explained patient and her related to that GI may not like to do any procedures until having evaluation for the cardiac issues. So currently we will treat conservatively.  All the records are reviewed and case discussed with Care Management/Social Workerr. Management plans discussed with the patient, family and they are in agreement.  CODE STATUS: DNR. TOTAL TIME TAKING CARE OF THIS PATIENT: 33 minutes.   Plan is to continue diuresis over the weekend and do catheterization to check for ischemia early next week.   Pt's niece will like to be updated with the plan and timing of cath over the weekend.  POSSIBLE D/C IN 2 DAYS, DEPENDING ON CLINICAL CONDITION.   Demetrios Loll M.D on 06/17/2017   Between 7am to 6pm - Pager - [phone]  After 6pm go to [email or web address] - password [email or web address] [facility]  Office   [phone]  CC: Primary care physician; [name], MD  Note: This dictation was prepared with Dragon dictation along with smaller phrase technology. Any transcriptional errors that result from this process are unintentional.

## 2017-06-17 NOTE — Evaluation (Signed)
Physical Therapy Evaluation Patient Details Name: Michele Schultz MRN: [id number] DOB: 1931-06-20 Today's Date: 06/17/2017   History of Present Illness  Michele Schultz is an 83yo black female who comes t [facility] on 7/10 c emesis and SOB, imaging revealing of bilat pleural effusion/congestion, admitted for CHF exacerbation. PMH: anemia, aortic stenosis, atrial tachycardia, PPM, DCHF, CAD s/p CBAGx2, HTN, HLD, B THA, B TKA, fibromyalgia, RA, and Vertigo. LVEF: 20-25%. At baseline, pt lives longterm SNF, [facility] for facility mobility, self propels 25-50% of time, requirs assist for ADL/IADL, and mod-max assist for bed mobility.   Clinical Impression  Pt admitted with above diagnosis. Pt currently with functional limitations due to the deficits listed below (see "PT Problem List"). Pt typically able to perform self propelled mobility in WC and modA with bed mobility, however at this visit, pt with increased weakness, in grips, BUE, and BLE. Epigastric pain and nausea is also a limiting factor at this time. Baseline functional level is generally low, requiring heavy physical assistance, however, pt is clearly acutely weak at this time. Pt will benefit from skilled PT intervention to increase independence and safety with basic mobility in preparation for discharge to the venue listed below.       Follow Up Recommendations SNF;Supervision for mobility/OOB    Equipment Recommendations  None recommended by PT    Recommendations for Other Services       Precautions / Restrictions Precautions Precautions: Fall Restrictions Weight Bearing Restrictions: No      Mobility  Bed Mobility               General bed mobility comments: no attempted due to considerable limb weakness, pain (pt unable to assist meaningfully, and baseine need for assistance for bed mobility)  Transfers                    Ambulation/Gait                Stairs            Wheelchair Mobility     Modified Rankin (Stroke Patients Only)       Balance                                             Pertinent Vitals/Pain Pain Assessment: Faces Faces Pain Scale: Hurts little more Pain Location: epigastric region, worse after meals, associated with nausea Pain Intervention(s): Limited activity within patient's tolerance;Monitored during session    Home Living Family/patient expects to be discharged to:: Skilled nursing facility                 Additional Comments: [place]     Prior Function Level of [place]: Needs assistance   Gait / Transfers Assistance Needed: AMB for transfers only, mostly WC for mobility, intermittent self propels   ADL's / Homemaking Assistance Needed: Assist required at SNF        Hand Dominance   Dominant Hand: Right    Extremity/Trunk Assessment   Upper Extremity Assessment Upper Extremity Assessment: LUE deficits/detail;RUE deficits/detail;Generalized weakness RUE Deficits / Details: grips very weak, elbows are 4-/5 on Lt; 3+/5 on Rt     Lower Extremity Assessment Lower Extremity Assessment: Generalized weakness (Ankles/Hips/Knees grossly 3+/5 bilat )    Cervical / Trunk Assessment Cervical / Trunk Assessment:  (chronic baseline weakness now with limiting pain)  Communication  Communication: No difficulties  Cognition Arousal/Alertness: Awake/alert Behavior During Therapy: WFL for tasks assessed/performed Overall Cognitive Status: Within Functional Limits for tasks assessed                                        General Comments      Exercises     Assessment/Plan    PT Assessment Patient needs continued PT services  PT Problem List Decreased strength;Decreased activity tolerance;Decreased mobility       PT Treatment Interventions Functional mobility training;Therapeutic activities;Therapeutic exercise;Patient/family education    PT Goals (Current goals can be found  in the Care Plan section)  Acute Rehab PT Goals Patient Stated Goal: decrease pain, regain strrength  PT Goal Formulation: With patient Time For Goal Achievement: 07/01/17 Potential to Achieve Goals: Good    Frequency Min 2X/week   Barriers to discharge        Co-evaluation               AM-PAC PT "6 Clicks" Daily Activity  Outcome Measure Difficulty turning over in bed (including adjusting bedclothes, sheets and blankets)?: Total Difficulty moving from lying on back to sitting on the side of the bed? : Total Difficulty sitting down on and standing up from a chair with arms (e.g., wheelchair, bedside commode, etc,.)?: Total Help needed moving to and from a bed to chair (including a wheelchair)?: Total Help needed walking in hospital room?: Total Help needed climbing 3-5 steps with a railing? : Total 6 Click Score: 6    End of Session   Activity Tolerance: Patient limited by fatigue;Patient limited by pain Patient left: in bed;with call bell/phone within reach;with bed alarm set Nurse Communication: Mobility status PT Visit Diagnosis: Muscle weakness (generalized) (M62.81);Other abnormalities of gait and mobility (R26.89)    Time: 1000-1012 PT Time Calculation (min) (ACUTE ONLY): 12 min   Charges:   PT Evaluation $PT Eval High Complexity: 1 Procedure     PT G Codes:        12:15 PM, Jun 30, 2017 Etta Grandchild, PT, DPT Physical Therapist - [place] [phone] ([place])  [phone] (mobile)    Hadli Vandemark C June 30, 2017, 12:13 PM

## 2017-06-17 NOTE — Progress Notes (Signed)
Patient Name: Michele Schultz Date of Encounter: 06/17/2017  Primary Cardiologist: Rockey Situ / [facility] Problem List     Principal Problem:   Acute combined systolic and diastolic CHF, NYHA class 3 (HCC) Active Problems:   Coronary artery disease involving coronary bypass graft of native heart with angina pectoris (HCC)   Ischemic cardiomyopathy   Complete heart block (HCC)   Aortic stenosis - s/p AVR   S/P CABG x 2   S/P AVR (aortic valve replacement)    Subjective   Still feels very tired. Two-week to feed herself and even go to the bathroom on bedside commode. She notes still having chest pain that is supple what substernal epigastric in nature. This pain is worse with simply moving around her shoulders or technique deep inspiration. It is not constant, but does calm with taking more than a normal breath. Even worse if she coughs.   Inpatient Medications    Scheduled Meds: . acidophilus  1 capsule Oral BID  . aspirin EC  81 mg Oral Daily  . atorvastatin  80 mg Oral Daily  . calcium carbonate  1 tablet Oral Daily  . cholecalciferol  1,000 Units Oral Daily  . docusate sodium  100 mg Oral BID  . enoxaparin (LOVENOX) injection  40 mg Subcutaneous Q24H  . famotidine  20 mg Oral BID  . [START ON 06/18/2017] furosemide  20 mg Oral BID  . gabapentin  100 mg Oral QHS  . lamoTRIgine  50 mg Oral BID  . LORazepam  0.5 mg Oral QHS  . magnesium hydroxide  10 mL Oral Daily  . magnesium oxide  400 mg Oral Daily  . metoCLOPramide  5 mg Oral TID AC & HS  . [START ON 06/18/2017] metoprolol succinate  25 mg Oral Daily  . potassium chloride SA  20 mEq Oral BID  . QUEtiapine  25 mg Oral BID  . sertraline  150 mg Oral QHS  . sodium chloride flush  3 mL Intravenous Q12H  . sucralfate  1 g Oral TID WC & HS   Continuous Infusions: . sodium chloride     PRN Meds: sodium chloride, acetaminophen, bisacodyl, HYDROcodone-acetaminophen, ipratropium-albuterol, nitroGLYCERIN,  ondansetron (ZOFRAN) IV, ondansetron, polyethylene glycol powder, promethazine, sodium chloride flush   Vital Signs    Vitals:   06/17/17 0010 06/17/17 0438 06/17/17 0521 06/17/17 0800  BP:  106/66  (!) 86/53  Pulse:  87 (!) 106 88  Resp:  16  20  Temp:  97.8 F (36.6 C)  98.2 F (36.8 C)  TempSrc:  Oral  Axillary  SpO2:  98%  96%  Weight: 222 lb 6.4 oz (100.9 kg)     Height:        Intake/Output Summary (Last 24 hours) at 06/17/17 1407 Last data filed at 06/17/17 1406  Gross per 24 hour  Intake              240 ml  Output              751 ml  Net             -511 ml   Filed Weights   06/15/17 0422 06/16/17 0500 06/17/17 0010  Weight: 214 lb 4.8 oz (97.2 kg) 217 lb 3.2 oz (98.5 kg) 222 lb 6.4 oz (100.9 kg)    Physical Exam    General appearance:  Very tired, lethargic appearing. She feels that she has not even the strength to get up to  go to the bathroom on the bedside commode. Neck: No LAD and, no carotid bruit. Mild JVP elevation, stable. Lungs: Mildly diminished by bibasilar breath sounds, nonlabored, otherwise good air movement. Heart: RRR with normal S1 and S2. 2/6 SEM at RUSB. Otherwise no M/R/G. Very distant heart sounds make it difficult to assess. Abdomen: Obese, otherwise soft/NT/ND 6 NABS. No HSM. Extremities: No C/C/E. No lesions. Pulses: 2+ and symmetric bilaterally. Neurologic: She is awake and alert and oriented 3. However she is somewhat drowsy and lethargic appearing. Very pleasant mood and affect.   Labs    CBC No results for input(s): WBC, NEUTROABS, HGB, HCT, MCV, PLT in the last 72 hours. Basic Metabolic Panel  Recent Labs  06/16/17 0531 06/17/17 0505  NA 139 138  K 4.0 4.0  CL 106 105  CO2 26 27  GLUCOSE 112* 119*  BUN 17 16  CREATININE 0.70 0.72  CALCIUM 9.2 9.1  MG 2.1  --    Liver Function Tests No results for input(s): AST, ALT, ALKPHOS, BILITOT, PROT, ALBUMIN in the last 72 hours. No results for input(s): LIPASE, AMYLASE in  the last 72 hours. Cardiac Enzymes No results for input(s): CKTOTAL, CKMB, CKMBINDEX, TROPONINI in the last 72 hours. BNP Invalid input(s): POCBNP D-Dimer No results for input(s): DDIMER in the last 72 hours. Hemoglobin A1C No results for input(s): HGBA1C in the last 72 hours. Fasting Lipid Panel No results for input(s): CHOL, HDL, LDLCALC, TRIG, CHOLHDL, LDLDIRECT in the last 72 hours. Thyroid Function Tests No results for input(s): TSH, T4TOTAL, T3FREE, THYROIDAB in the last 72 hours.  Invalid input(s): FREET3  Telemetry    V paced with some NSR (aberrantly conducted beats) - Not Vtach - Personally Reviewed  ECG    No new EKG - Personally Reviewed  Radiology    No results found.  Cardiac Studies    TTE 06/13/2017:Moderate LV dilation. Severely reduced EF of 20-25% with diffuse hypokinesis. Severe MR. Moderate pulmonary hypertension. Aortic stenosis (not quantified, but with aortic valve area of 1.06 cm - likely mild)   Patient Profile     83 y.o. female with history of CAD (s/p CABG in 10/2011 with SVG-LAD and SVG-OM1 with low-risk NST in 08/2015), severe AS (s/p AVR in 2012), chronic diastolic CHF, CHB (s/p PPM placement in 10/2011), PAT, HTN, HLD, Type 2 DM, and persistent nausea (thought secondary to gastroparesis) who was admitted for the evaluation of CHF.  Assessment & Plan  Principal Problem:   Acute combined systolic and diastolic CHF, NYHA class 3 (HCC) Active Problems:   Coronary artery disease involving coronary bypass graft of native heart with angina pectoris (HCC)   Ischemic cardiomyopathy   Complete heart block (HCC)   Aortic stenosis - s/p AVR   S/P CABG x 2   S/P AVR (aortic valve replacement)  Recommendation:  DC Entresto for now given hypotension  Reduced Toprol dose to 25 mg daily due to hypotension  Continue oral Lasix to keep I=O  Plan to discuss +/- & timing of R&LHC with niece today    Principal Problem:   Acute combined systolic  and diastolic CHF, NYHA class 3 ([place]) / Likely Ischemic Cardiomyopathy- mildly reduced EF was noted on echo. - Her condition is worsened with lower blood pressures after medication adjustment.  At this point, with low blood pressure and her not eating very well, I agree with oral Lasix. Would try to keep ins and outs equal.   She was converted from ACE inhibitor to  Entresto during this hospitalization, however her blood pressures have continued to be low and her energy level/overall demeanor has notably worsened with her blood pressure being lower.  Plan for now will be to hold Entresto (I have written to DC for now) - we'll need to reassess her blood pressure and probably consider whether or not she will be able to tolerate Entresto.  I have additionally reduced dose of Toprol due to hypotension.   Unable to initiate spironolactone because of hypotension  If renal function remains stable, consider digoxin    Coronary artery disease involving coronary bypass graft of native heart with angina pectoris ([place]); S/P CABG x 2 (10/2011 with SVG-LAD and SVG-OM1 with low-risk NST in 08/2015)  Troponin negative  Dr. Fletcher Anon discussed the potential of right left heart catheterization with the patient, he also called and left message with the patient's niece.   He also indicated that given the patient's poor functional status, question the benefit of invasive evaluation at this point.  Will defer decision-making on this for now. -- at baseline, she does not walk.    Perhaps the only impetus for considering ischemic evaluation would be CP or recurrent HF.  [name] is planning on coming in today. I will hopefully talk with her today to discuss pros and cons of cardiac catheterization. However for now I feel as though we need to stabilize her blood pressures and energy level.  I think her chest pain that she's having sounds more musculoskeletal in that is worse with deep inspiration and with  movement than it is a true anginal type symptom. However, we cannot be certain.      Complete heart block (HCC) - PPM in place; also has history of A. fib.   Aortic stenosis - S/P AVR (aortic valve replacement) - roughly mild aortic stenosis by echo; soft murmur on exam.    Patient has significantly decreased appetite and poor by mouth intake and failure to thrive. Overall poor functional status. Limits options for aggressive management. CODE STATUS addressed by primary team. Currently made DO NOT RESUSCITATE now.     Glenetta Hew, M.D., M.S. Interventional Cardiologist   Pager # [phone] Phone # [phone] [address] [place], [place] [address]

## 2017-06-18 MED ORDER — FUROSEMIDE 10 MG/ML IJ SOLN
20.0000 mg | Freq: Two times a day (BID) | INTRAMUSCULAR | Status: DC
  Administered 2017-06-18 – 2017-06-20 (×3): 20 mg via INTRAVENOUS
  Filled 2017-06-18 (×5): qty 2

## 2017-06-18 MED ORDER — LOSARTAN POTASSIUM 25 MG PO TABS
25.0000 mg | ORAL_TABLET | Freq: Every day | ORAL | Status: DC
  Administered 2017-06-18 – 2017-06-21 (×3): 25 mg via ORAL
  Filled 2017-06-18 (×3): qty 1

## 2017-06-18 NOTE — Care Management Important Message (Signed)
Important Message  Patient Details  Name: Michele Schultz MRN: [id number] Date of Birth: 10-14-1931   Medicare Important Message Given:  Yes Signed IM notice given    Katrina Stack, RN 06/18/2017, 11:34 AM

## 2017-06-18 NOTE — Progress Notes (Signed)
Patient Name: Michele Schultz Date of Encounter: 06/18/2017  Primary Cardiologist: [facility] Problem List     Principal Problem:   Acute combined systolic and diastolic CHF, NYHA class 3 (HCC) Active Problems:   Complete heart block (HCC)   S/P CABG x 2   Aortic stenosis - s/p AVR   Coronary artery disease involving coronary bypass graft of native heart with angina pectoris (HCC)   S/P AVR (aortic valve replacement)   Ischemic cardiomyopathy     Subjective   The patient is lethargic today. She complains of chest pain with coughing. There is white sputum.  Inpatient Medications    Scheduled Meds: . acidophilus  1 capsule Oral BID  . aspirin EC  81 mg Oral Daily  . atorvastatin  80 mg Oral Daily  . calcium carbonate  1 tablet Oral Daily  . cholecalciferol  1,000 Units Oral Daily  . docusate sodium  100 mg Oral BID  . enoxaparin (LOVENOX) injection  40 mg Subcutaneous Q24H  . famotidine  20 mg Oral BID  . furosemide  20 mg Intravenous BID AC  . gabapentin  100 mg Oral QHS  . lamoTRIgine  50 mg Oral BID  . LORazepam  0.5 mg Oral QHS  . losartan  25 mg Oral Daily  . magnesium hydroxide  10 mL Oral Daily  . magnesium oxide  400 mg Oral Daily  . metoCLOPramide  5 mg Oral TID AC & HS  . metoprolol succinate  25 mg Oral Daily  . potassium chloride SA  20 mEq Oral BID  . QUEtiapine  25 mg Oral BID  . sertraline  150 mg Oral QHS  . sodium chloride flush  3 mL Intravenous Q12H  . sucralfate  1 g Oral TID WC & HS   Continuous Infusions: . sodium chloride     PRN Meds: sodium chloride, acetaminophen, bisacodyl, HYDROcodone-acetaminophen, ipratropium-albuterol, nitroGLYCERIN, ondansetron (ZOFRAN) IV, ondansetron, polyethylene glycol powder, promethazine, sodium chloride flush   Vital Signs    Vitals:   06/17/17 2042 06/17/17 2203 06/18/17 0613 06/18/17 0830  BP: (!) 88/62 (!) 120/46 (!) 101/38 (!) 118/52  Pulse: 80 68 60 62  Resp:   18   Temp:   98.8 F  (37.1 C)   TempSrc:   Oral   SpO2:   90%   Weight:   213 lb 3.2 oz (96.7 kg)   Height:        Intake/Output Summary (Last 24 hours) at 06/18/17 1024 Last data filed at 06/18/17 0250  Gross per 24 hour  Intake              720 ml  Output             1103 ml  Net             -383 ml   Filed Weights   06/16/17 0500 06/17/17 0010 06/18/17 0613  Weight: 217 lb 3.2 oz (98.5 kg) 222 lb 6.4 oz (100.9 kg) 213 lb 3.2 oz (96.7 kg)    Physical Exam    GEN: Well nourished, well developed, in no acute distress.  HEENT: Grossly normal.  Neck: Supple, JVD elevated ~ 8 cm, no carotid bruits, or masses. Cardiac: RRR, II/VI systolic murmur, no rubs, or gallops. No clubbing, cyanosis, edema.  Radials/DP/PT 2+ and equal bilaterally.  Respiratory:  Diminished breath sounds bilaterally. GI: Soft, nontender, nondistended, BS + x 4. MS: no deformity or atrophy. Skin: warm and dry,  no rash. Neuro:  Strength and sensation are intact. Psych: The patient is lethargic today but responsive.  Labs    CBC No results for input(s): WBC, NEUTROABS, HGB, HCT, MCV, PLT in the last 72 hours. Basic Metabolic Panel  Recent Labs  06/16/17 0531 06/17/17 0505  NA 139 138  K 4.0 4.0  CL 106 105  CO2 26 27  GLUCOSE 112* 119*  BUN 17 16  CREATININE 0.70 0.72  CALCIUM 9.2 9.1  MG 2.1  --    Liver Function Tests No results for input(s): AST, ALT, ALKPHOS, BILITOT, PROT, ALBUMIN in the last 72 hours. No results for input(s): LIPASE, AMYLASE in the last 72 hours. Cardiac Enzymes No results for input(s): CKTOTAL, CKMB, CKMBINDEX, TROPONINI in the last 72 hours. BNP Invalid input(s): POCBNP D-Dimer No results for input(s): DDIMER in the last 72 hours. Hemoglobin A1C No results for input(s): HGBA1C in the last 72 hours. Fasting Lipid Panel No results for input(s): CHOL, HDL, LDLCALC, TRIG, CHOLHDL, LDLDIRECT in the last 72 hours. Thyroid Function Tests No results for input(s): TSH, T4TOTAL, T3FREE,  THYROIDAB in the last 72 hours.  Invalid input(s): FREET3  Telemetry    V paced - Personally Reviewed  ECG    n/a - Personally Reviewed  Radiology    No results found.  Cardiac Studies   TTE 06/13/2017: Study Conclusions  - Left ventricle: The cavity size was moderately dilated. Systolic function was severely reduced. The estimated ejection fraction was in the range of 20% to 25%. Diffuse hypokinesis. - Aortic valve: There was trivial regurgitation. Valve area (VTI): 1.06 cm^2. Valve area (Vmax): 0.89 cm^2. Valve area (Vmean): 0.95 cm^2. - Mitral valve: Calcified annulus. Mildly thickened leaflets . There was severe regurgitation. - Left atrium: The atrium was mildly dilated. - Right atrium: The atrium was mildly dilated. - Tricuspid valve: There was moderate regurgitation.  Impressions:  - The right ventricular systolic pressure was increased consistent with moderate pulmonary hypertension.  Patient Profile     83 y.o. female with history of CAD (s/p CABG in 10/2011 with SVG-LAD and SVG-OM1 with low-risk NST in 08/2015), severe AS (s/p AVR in 2012), chronic diastolic CHF, CHB (s/p PPM placement in 10/2011), PAT, HTN, HLD, Type 2 DM, and persistent nausea (thought secondary to gastroparesis) who was admitted for the evaluation of CHF.  Assessment & Plan    1. Acute systolic CHF/acute on chronic diastolic CHF/dilated cardiomyopathy: -Newly reduced EF as above  - The patient initially had improvement in symptoms but she appears to be worse today with possible volume overload causing coughing and chest pain. I resumed IV furosemide 20 mg twice daily. Continue Toprol. Delene Loll was discontinued due to hypotension. I'm going to start small dose losartan.   2. CAD: -S/p CABG in 10/2011 with SVG-LAD and SVG-OM1 with low-risk NST in 08/2015 -No anginal symptoms -Troponins negative -ASA and statin - The patient's overall functional capacity is poor and I  don't think she is a good candidate for a right and left cardiac catheterization. I discussed this with the patient last week and she was agreeable. She appears to be lethargic now. I left a message to her niece in am going to call her again today.  3. AS: -As above -Ischemic evaluation as above  4. Complete heart block: -S/p PPM -Device appears to be functioning appropriately   5. Decreased appetite/gastroparesis: -Albumin 4.0 at admission -Per IM  The patient is lethargic and is severely deconditioned. Given her age, cardiac status  and comorbidities, overall prognosis is very poor. Consider palliative care consult.  Signed, Kathlyn Sacramento. MD [facility] 06/18/2017, 10:24 AM

## 2017-06-18 NOTE — Progress Notes (Signed)
  Advanced Care Plan.  Purpose of Encounter: Code status. Parties in Attendance: the patient, her niece (POA), me. Patient's Decisional Capacity: No. Medical Story: The patient was admitted for acute combined systolic and diastolic CHF, Ischemic Cardiomyopathy. She has been treated with diuretics with some improvement. But due to low blood pressure, Lasix and Entresto were discontinued. The patient become more lethargic, chest pain and shortness breath. Per Dr. Fletcher Anon, cardiologist, the patient's has very poor prognosis and may need palliative care.  I discussed with the patient's niece about the patient's current condition, very poor prognosis, treatment options and palliative care option. She understands and agreed to get palliative care consult.  Plan. Palliative care consult. Code Status: DO NOT RESUSCITATE Time spent discussing advance care planning: 18 minutes.

## 2017-06-18 NOTE — Progress Notes (Addendum)
[place] [facility] [place] NAME: Michele Schultz    MR#:  [id number]  DATE OF BIRTH:  June 04, 1931  SUBJECTIVE:  CHIEF COMPLAINT:   Chief Complaint  Patient presents with  . Nausea  . Emesis   Came with SOB.   Have significant drop in EF%   Complain of nausea with her food for last 2 months which is not changed.  The patient has cough, SOB, chest pain, nausea and generalized weakness. REVIEW OF SYSTEMS:   CONSTITUTIONAL: generalized weakness.  EYES: No blurred or double vision.  EARS, NOSE, AND THROAT: No tinnitus or ear pain.  RESPIRATORY: has cough and shortness of breath, No wheezing or hemoptysis.  CARDIOVASCULAR: has chest pain,  has orthopnea, edema.  GASTROINTESTINAL: has nausea,no vomiting, diarrhea or abdominal pain.  GENITOURINARY: No dysuria, hematuria.  ENDOCRINE: No polyuria, nocturia,  HEMATOLOGY: No anemia, easy bruising or bleeding SKIN: No rash or lesion. MUSCULOSKELETAL: No joint pain or arthritis.   NEUROLOGIC: No tingling, numbness, weakness.  PSYCHIATRY: No anxiety or depression.   ROS  DRUG ALLERGIES:   Allergies  Allergen Reactions  . Citalopram Other (See Comments)    Reaction:  Altered mental status   . Cymbalta [Duloxetine Hcl] Other (See Comments)    Reaction:  Sedative for pt   . Imipramine Other (See Comments)    Reaction:  Unknown   . Proton Pump Inhibitors Other (See Comments)    Reaction:  Unknown   . Venlafaxine Nausea And Vomiting and Other (See Comments)    Reaction:  Dizziness     VITALS:  Blood pressure (!) 118/52, pulse 62, temperature 98.8 F (37.1 C), temperature source Oral, resp. rate 18, height 5\' 4"  (1.626 m), weight 213 lb 3.2 oz (96.7 kg), SpO2 90 %.  PHYSICAL EXAMINATION:   GENERAL:  82 y.o.-year-old patient lying in the bed with no acute distress. Obese. EYES: Pupils equal, round, reactive to light and accommodation. No scleral icterus. Extraocular muscles intact.  HEENT:  Head atraumatic, normocephalic. Oropharynx and nasopharynx clear.  NECK:  Supple, no jugular venous distention. No thyroid enlargement, no tenderness.  LUNGS: Decreased breath sounds bilaterally, bibasilar crepitations heard. No use of accessory muscles of respiration.  CARDIOVASCULAR: S1, S2 normal. No murmurs, rubs, or gallops.  ABDOMEN: Soft, nontender, nondistended. Bowel sounds present. No organomegaly or mass.  EXTREMITIES: Has pedal edema, no cyanosis, or clubbing.  NEUROLOGIC: Cranial nerves II through XII are intact. Muscle strength 3/5 in all extremities. Sensation intact. Gait not checked.  PSYCHIATRIC: The patient is lethargic.  SKIN: No obvious rash, lesion, or ulcer.   Physical Exam LABORATORY PANEL:   CBC  Recent Labs Lab 06/13/17 0130  WBC 6.0  HGB 10.7*  HCT 32.7*  PLT 292   ------------------------------------------------------------------------------------------------------------------  Chemistries   Recent Labs Lab 06/12/17 2045  06/16/17 0531 06/17/17 0505  NA 140  < > 139 138  K 4.3  < > 4.0 4.0  CL 111  < > 106 105  CO2 21*  < > 26 27  GLUCOSE 123*  < > 112* 119*  BUN 13  < > 17 16  CREATININE 0.75  < > 0.70 0.72  CALCIUM 9.3  < > 9.2 9.1  MG 1.8  --  2.1  --   AST 26  --   --   --   ALT 17  --   --   --   ALKPHOS 74  --   --   --  BILITOT 0.8  --   --   --   < > = values in this interval not displayed. ------------------------------------------------------------------------------------------------------------------  Cardiac Enzymes  Recent Labs Lab 06/13/17 0725 06/13/17 1330  TROPONINI <0.03 <0.03   ------------------------------------------------------------------------------------------------------------------  RADIOLOGY:  No results found.  ASSESSMENT AND PLAN:   Principal Problem:   Acute combined systolic and diastolic CHF, NYHA class 3 (HCC) Active Problems:   Complete heart block (HCC)   S/P CABG x 2   Aortic  stenosis - s/p AVR   Coronary artery disease involving coronary bypass graft of native heart with angina pectoris (HCC)   S/P AVR (aortic valve replacement)   Ischemic cardiomyopathy  * Acute combined systolic and diastolic CHF, NYHA class 3 ([place]), LV EF: 20% -   25%   Drop in EF   IV lasix- changed from 40 mg IV BID to 20 MG iv BID due to hypotension.   I/O monitoring- > 3 ltr negative fluid balance.   Appreciated cardio consult.   Stopped CC blockers, added beta blockers and Converted from ACE inhibitor to  Praxair. Dr. Ellyn Hack discontinued Delene Loll due to hypotension. Per Dr. Fletcher Anon, resume IV furosemide 20 mg twice daily, start small dose losartan. Continue Toprol. Per Dr. Fletcher Anon, the patient's overall functional capacity is poor and I don't think she is a good candidate for a right and left cardiac catheterization.  * CAD   ASA, atorvastatin, metoprolol.  * dilated cardiomyopathy   enteresto was discontinued.  Start losartan and continue Metoprolol.   May add spironolactone if BP allows before discharge.  * complete heart block   Paced rhythm.  * nausea- chronic complain.   On Reglan, famotidine. added sucralfate Dr. Anselm Jungling explained patient and her related to that GI may not like to do any procedures until having evaluation for the cardiac issues. So currently we will treat conservatively.  I discussed with Dr. Fletcher Anon. The patient has poor prognosis. May need palliative care consult. All the records are reviewed and case discussed with Care Management/Social Workerr. Management plans discussed with the patient and his niece and they are in agreement.  CODE STATUS: DNR. TOTAL TIME TAKING CARE OF THIS PATIENT: 38 minutes.   Plan is to continue diuresis over the weekend and do catheterization to check for ischemia early next week.   Pt's niece will like to be updated with the plan and timing of cath over the weekend.  POSSIBLE D/C IN 2 DAYS, DEPENDING ON CLINICAL  CONDITION.   Demetrios Loll M.D on 06/18/2017   Between 7am to 6pm - Pager - [phone]  After 6pm go to [email or web address] - password [email or web address] [facility]  Office  [phone]  CC: Primary care physician; [name], MD  Note: This dictation was prepared with Dragon dictation along with smaller phrase technology. Any transcriptional errors that result from this process are unintentional.

## 2017-06-19 DIAGNOSIS — Z515 Encounter for palliative care: Secondary | ICD-10-CM

## 2017-06-19 DIAGNOSIS — Z7189 Other specified counseling: Secondary | ICD-10-CM

## 2017-06-19 DIAGNOSIS — I350 Nonrheumatic aortic (valve) stenosis: Secondary | ICD-10-CM

## 2017-06-19 LAB — CREATININE, SERUM
Creatinine, Ser: 0.7 mg/dL (ref 0.44–1.00)
GFR calc Af Amer: >60 mL/min (ref >60)

## 2017-06-19 NOTE — Progress Notes (Signed)
Held BP meds and lasix d/t low BP.  Continue to monitor.  Dr. Bridgett Larsson aware.

## 2017-06-19 NOTE — Progress Notes (Signed)
Patient Name: Michele Schultz Date of Encounter: 06/19/2017  Primary Cardiologist: [facility] Problem List     Principal Problem:   Acute combined systolic and diastolic CHF, NYHA class 3 (HCC) Active Problems:   Complete heart block (HCC)   S/P CABG x 2   Aortic stenosis - s/p AVR   Coronary artery disease involving coronary bypass graft of native heart with angina pectoris (HCC)   S/P AVR (aortic valve replacement)   Ischemic cardiomyopathy     Subjective   Remains SOB and with orthopnea. Weight down 11 pounds for the admission. Renal function stable. Restarted on IV Lasix 20 mg bid on 7/16 with good UOP.   Inpatient Medications    Scheduled Meds: . acidophilus  1 capsule Oral BID  . aspirin EC  81 mg Oral Daily  . atorvastatin  80 mg Oral Daily  . calcium carbonate  1 tablet Oral Daily  . cholecalciferol  1,000 Units Oral Daily  . docusate sodium  100 mg Oral BID  . enoxaparin (LOVENOX) injection  40 mg Subcutaneous Q24H  . famotidine  20 mg Oral BID  . furosemide  20 mg Intravenous BID AC  . gabapentin  100 mg Oral QHS  . lamoTRIgine  50 mg Oral BID  . LORazepam  0.5 mg Oral QHS  . losartan  25 mg Oral Daily  . magnesium hydroxide  10 mL Oral Daily  . magnesium oxide  400 mg Oral Daily  . metoCLOPramide  5 mg Oral TID AC & HS  . metoprolol succinate  25 mg Oral Daily  . potassium chloride SA  20 mEq Oral BID  . QUEtiapine  25 mg Oral BID  . sertraline  150 mg Oral QHS  . sodium chloride flush  3 mL Intravenous Q12H  . sucralfate  1 g Oral TID WC & HS   Continuous Infusions: . sodium chloride     PRN Meds: sodium chloride, acetaminophen, bisacodyl, HYDROcodone-acetaminophen, ipratropium-albuterol, nitroGLYCERIN, ondansetron (ZOFRAN) IV, ondansetron, polyethylene glycol powder, promethazine, sodium chloride flush   Vital Signs    Vitals:   06/18/17 2229 06/19/17 0600 06/19/17 0608 06/19/17 0619  BP: (!) 118/49  (!) 86/42 (!) 94/44  Pulse: (!)  111  (!) 109   Resp: 18  18   Temp: 97.9 F (36.6 C)  98.7 F (37.1 C)   TempSrc: Oral  Oral   SpO2: 99%  95%   Weight:  210 lb 11.2 oz (95.6 kg)    Height:        Intake/Output Summary (Last 24 hours) at 06/19/17 0833 Last data filed at 06/19/17 0600  Gross per 24 hour  Intake              480 ml  Output              700 ml  Net             -220 ml   Filed Weights   06/17/17 0010 06/18/17 0613 06/19/17 0600  Weight: 222 lb 6.4 oz (100.9 kg) 213 lb 3.2 oz (96.7 kg) 210 lb 11.2 oz (95.6 kg)    Physical Exam    GEN: Well nourished, well developed, in no acute distress.  HEENT: Grossly normal.  Neck: Supple, JVD elevated ~ 8 cm, no carotid bruits, or masses. Cardiac: RRR, IIVI systolic murmur, no rubs, or gallops. No clubbing, cyanosis, edema.  Radials/DP/PT 2+ and equal bilaterally.  Respiratory:  Diminished breath sounds bilaterally.  GI: Soft, nontender, nondistended, BS + x 4. MS: no deformity or atrophy. Skin: warm and dry, no rash. Neuro:  Strength and sensation are intact. Psych: AAOx3.  Normal affect.  Labs    CBC No results for input(s): WBC, NEUTROABS, HGB, HCT, MCV, PLT in the last 72 hours. Basic Metabolic Panel  Recent Labs  06/17/17 0505 06/19/17 0610  NA 138  --   K 4.0  --   CL 105  --   CO2 27  --   GLUCOSE 119*  --   BUN 16  --   CREATININE 0.72 0.70  CALCIUM 9.1  --    Liver Function Tests No results for input(s): AST, ALT, ALKPHOS, BILITOT, PROT, ALBUMIN in the last 72 hours. No results for input(s): LIPASE, AMYLASE in the last 72 hours. Cardiac Enzymes No results for input(s): CKTOTAL, CKMB, CKMBINDEX, TROPONINI in the last 72 hours. BNP Invalid input(s): POCBNP D-Dimer No results for input(s): DDIMER in the last 72 hours. Hemoglobin A1C No results for input(s): HGBA1C in the last 72 hours. Fasting Lipid Panel No results for input(s): CHOL, HDL, LDLCALC, TRIG, CHOLHDL, LDLDIRECT in the last 72 hours. Thyroid Function Tests No  results for input(s): TSH, T4TOTAL, T3FREE, THYROIDAB in the last 72 hours.  Invalid input(s): FREET3  Telemetry    V paced - Personally Reviewed  ECG    n/a - Personally Reviewed  Radiology    No results found.  Cardiac Studies   TTE 06/13/2017: Study Conclusions  - Left ventricle: The cavity size was moderately dilated. Systolic function was severely reduced. The estimated ejection fraction was in the range of 20% to 25%. Diffuse hypokinesis. - Aortic valve: There was trivial regurgitation. Valve area (VTI): 1.06 cm^2. Valve area (Vmax): 0.89 cm^2. Valve area (Vmean): 0.95 cm^2. - Mitral valve: Calcified annulus. Mildly thickened leaflets . There was severe regurgitation. - Left atrium: The atrium was mildly dilated. - Right atrium: The atrium was mildly dilated. - Tricuspid valve: There was moderate regurgitation.  Impressions:  - The right ventricular systolic pressure was increased consistent with moderate pulmonary hypertension.  Patient Profile     83 y.o. female with history of CAD (s/p CABG in 10/2011 with SVG-LAD and SVG-OM1 with low-risk NST in 08/2015), severe AS (s/p AVR in 2012), chronic diastolic CHF, CHB (s/p PPM placement in 10/2011), PAT, HTN, HLD, Type 2 DM, and persistent nausea (thought secondary to gastroparesis) who was admittedfor the evaluation of CHF.  Assessment & Plan    1. Acute systolic CHF/acute on chronic diastolic CHF/dilated cardiomyopathy: -Newly reduced EF as above -The patient initially had improvement in symptoms but she appeared to be worse on 7/16, with possible volume overload causing coughing and chest pain. Shew as resumed on IV furosemide 20 mg twice daily with improving symptoms -Continue IV Lasix 20 mg bid with KCl repletion -Continue Toprol -Entresto was discontinued due to hypotension -Continue small dose losartan -Consider recheck CXR   2. CAD: -S/p CABG in 10/2011 with SVG-LAD and SVG-OM1 with  low-risk NST in 08/2015 -No anginal symptoms -Troponins negative -ASA and statin - The patient's overall functional capacity is poor and she has been felt to be a poor candidate for a right and left cardiac catheterization. She is agreeable to medical management at this time  3. AS: -As above -Ischemic evaluation as above  4. Complete heart block: -S/p PPM -Device appears to be functioning appropriately   5. Decreased appetite/gastroparesis: -Albumin 4.0 at admission -Per IM  Signed, Thurmond Butts  [facility] Pager: [phone] 06/19/2017, 8:33 AM

## 2017-06-19 NOTE — Progress Notes (Signed)
[facility] [place] NAME: Michele Schultz    MR#:  [id number]  DATE OF BIRTH:  [birth year]  SUBJECTIVE:  CHIEF COMPLAINT:   Chief Complaint  Patient presents with  . Nausea  . Emesis   Came with SOB.   Have significant drop in EF%   Complain of nausea with her food for last 2 months which is not changed.  The patient still complains of cough, SOB, chest pain, nausea and generalized weakness. BP was 86/42 this am. REVIEW OF SYSTEMS:   CONSTITUTIONAL: generalized weakness.  EYES: No blurred or double vision.  EARS, NOSE, AND THROAT: No tinnitus or ear pain.  RESPIRATORY: has cough and shortness of breath, No wheezing or hemoptysis.  CARDIOVASCULAR: has chest pain,  has orthopnea, edema.  GASTROINTESTINAL: has nausea,no vomiting, diarrhea or abdominal pain.  GENITOURINARY: No dysuria, hematuria.  ENDOCRINE: No polyuria, nocturia,  HEMATOLOGY: No anemia, easy bruising or bleeding SKIN: No rash or lesion. MUSCULOSKELETAL: No joint pain or arthritis.   NEUROLOGIC: No tingling, numbness, weakness.  PSYCHIATRY: No anxiety or depression.   ROS  DRUG ALLERGIES:   Allergies  Allergen Reactions  . Citalopram Other (See Comments)    Reaction:  Altered mental status   . Cymbalta [Duloxetine Hcl] Other (See Comments)    Reaction:  Sedative for pt   . Imipramine Other (See Comments)    Reaction:  Unknown   . Proton Pump Inhibitors Other (See Comments)    Reaction:  Unknown   . Venlafaxine Nausea And Vomiting and Other (See Comments)    Reaction:  Dizziness     VITALS:  Blood pressure (!) 104/58, pulse (!) 110, temperature 98.2 F (36.8 C), resp. rate 20, height 5\' 4"  (1.626 m), weight 210 lb 11.2 oz (95.6 kg), SpO2 97 %.  PHYSICAL EXAMINATION:   GENERAL:  83 y.o.-year-old patient lying in the bed with no acute distress. Obese. EYES: Pupils equal, round, reactive to light and accommodation. No scleral icterus. Extraocular muscles  intact.  HEENT: Head atraumatic, normocephalic. Oropharynx and nasopharynx clear.  NECK:  Supple, no jugular venous distention. No thyroid enlargement, no tenderness.  LUNGS: Decreased breath sounds bilaterally, bibasilar crepitations heard. No use of accessory muscles of respiration.  CARDIOVASCULAR: S1, S2 normal. No murmurs, rubs, or gallops.  ABDOMEN: Soft, nontender, nondistended. Bowel sounds present. No organomegaly or mass.  EXTREMITIES: Has pedal edema, no cyanosis, or clubbing.  NEUROLOGIC: Cranial nerves II through XII are intact. Muscle strength 3/5 in all extremities. Sensation intact. Gait not checked.  PSYCHIATRIC: The patient is confused.  SKIN: No obvious rash, lesion, or ulcer.   Physical Exam LABORATORY PANEL:   CBC  Recent Labs Lab 06/13/17 0130  WBC 6.0  HGB 10.7*  HCT 32.7*  PLT 292   ------------------------------------------------------------------------------------------------------------------  Chemistries   Recent Labs Lab 06/12/17 2045  06/16/17 0531 06/17/17 0505 06/19/17 0610  NA 140  < > 139 138  --   K 4.3  < > 4.0 4.0  --   CL 111  < > 106 105  --   CO2 21*  < > 26 27  --   GLUCOSE 123*  < > 112* 119*  --   BUN 13  < > 17 16  --   CREATININE 0.75  < > 0.70 0.72 0.70  CALCIUM 9.3  < > 9.2 9.1  --   MG 1.8  --  2.1  --   --   AST 26  --   --   --   --  ALT 17  --   --   --   --   ALKPHOS 74  --   --   --   --   BILITOT 0.8  --   --   --   --   < > = values in this interval not displayed. ------------------------------------------------------------------------------------------------------------------  Cardiac Enzymes  Recent Labs Lab 06/13/17 0725 06/13/17 1330  TROPONINI <0.03 <0.03   ------------------------------------------------------------------------------------------------------------------  RADIOLOGY:  No results found.  ASSESSMENT AND PLAN:   Principal Problem:   Acute combined systolic and diastolic CHF, NYHA  class 3 (HCC) Active Problems:   Complete heart block (HCC)   S/P CABG x 2   Aortic stenosis - s/p AVR   Coronary artery disease involving coronary bypass graft of native heart with angina pectoris (HCC)   S/P AVR (aortic valve replacement)   Ischemic cardiomyopathy   Goals of care, counseling/discussion  * Acute combined systolic and diastolic CHF, NYHA class 3 ([place]), LV EF: 20% -   25%   Drop in EF   IV lasix- changed from 40 mg IV BID to 20 MG iv BID due to hypotension.   I/O monitoring- > 3 ltr negative fluid balance.   Appreciated cardio consult.   Stopped CC blockers, added beta blockers and Converted from ACE inhibitor to  Praxair. Dr. Ellyn Hack discontinued Delene Loll due to hypotension. Per Dr. Fletcher Anon, resume IV furosemide 20 mg twice daily, start small dose losartan. Continue Toprol. Per Dr. Fletcher Anon, the patient's overall functional capacity is poor and I don't think she is a good candidate for a right and left cardiac catheterization. Hold the Lasix and losartan due to hypotension.  * CAD   ASA, atorvastatin, metoprolol.  * dilated cardiomyopathy   enteresto was discontinued.  Started losartan and hold Metoprolol.   May add spironolactone if BP allows before discharge.  * complete heart block   Paced rhythm.  * nausea- chronic complain.   On Reglan, famotidine. added sucralfate Dr. Anselm Jungling explained patient and her related to that GI may not like to do any procedures until having evaluation for the cardiac issues. So currently we will treat conservatively.  I discussed with Rynn, cardiology PA and Megan, palliative care NP. The patient has poor prognosis. May need palliative care consult. All the records are reviewed and case discussed with Care Management/Social Workerr. Management plans discussed with the patient and his niece and they are in agreement.  CODE STATUS: DNR. TOTAL TIME TAKING CARE OF THIS PATIENT: 38 minutes.   POSSIBLE D/C IN 2 DAYS, DEPENDING ON  CLINICAL CONDITION.   Demetrios Loll M.D on 06/19/2017   Between 7am to 6pm - Pager - [phone]  After 6pm go to [email or web address] - password [email or web address] [facility]  Office  [phone]  CC: Primary care physician; [name], MD  Note: This dictation was prepared with Dragon dictation along with smaller phrase technology. Any transcriptional errors that result from this process are unintentional.

## 2017-06-19 NOTE — Progress Notes (Signed)
Physical Therapy Treatment Patient Details Name: Michele Schultz MRN: [id number] DOB: June 04, 1931 Today's Date: 06/19/2017    History of Present Illness Michele Schultz is an 83yo black female who comes t [facility] on 7/10 c emesis and SOB, imaging revealing of bilat pleural effusion/congestion, admitted for CHF exacerbation. PMH: anemia, aortic stenosis, atrial tachycardia, PPM, DCHF, CAD s/p CBAGx2, HTN, HLD, B THA, B TKA, fibromyalgia, RA, and Vertigo. LVEF: 20-25%. At baseline, pt lives longterm SNF, [facility] for facility mobility, self propels 25-50% of time, requirs assist for ADL/IADL, and mod-max assist for bed mobility.     PT Comments    Nursing in with pt upon arrival.  Stated BP was low 85/61 and requested limiting session to bed activities only.  Pt alert and engaged with Probation officer.  Pt given choice to participate or wait until later.  She initially stated she did not want to participate but then stated "I gotta do it".  Session limited to general hand and elbow ROM x 10 before pt stated she was too fatigued and requested to stop session.  Will continue as appropriate.   Follow Up Recommendations  SNF;Supervision for mobility/OOB     Equipment Recommendations  None recommended by PT    Recommendations for Other Services       Precautions / Restrictions Precautions Precautions: Fall Restrictions Weight Bearing Restrictions: No    Mobility  Bed Mobility                  Transfers                    Ambulation/Gait                 Stairs            Wheelchair Mobility    Modified Rankin (Stroke Patients Only)       Balance                                            Cognition Arousal/Alertness: Awake/alert Behavior During Therapy: WFL for tasks assessed/performed Overall Cognitive Status: Within Functional Limits for tasks assessed                                        Exercises Other Exercises Other  Exercises: general hand and elbow ROM x 10 before pt c/o fatigue and requested to stop session.    General Comments        Pertinent Vitals/Pain Pain Assessment: No/denies pain    Home Living                      Prior Function            PT Goals (current goals can now be found in the care plan section) Progress towards PT goals: Progressing toward goals    Frequency    Min 2X/week      PT Plan Current plan remains appropriate    Co-evaluation              AM-PAC PT "6 Clicks" Daily Activity  Outcome Measure  Difficulty turning over in bed (including adjusting bedclothes, sheets and blankets)?: Total Difficulty moving from lying on back to sitting on the side of the bed? : Total Difficulty sitting  down on and standing up from a chair with arms (e.g., wheelchair, bedside commode, etc,.)?: Total Help needed moving to and from a bed to chair (including a wheelchair)?: Total Help needed walking in hospital room?: Total Help needed climbing 3-5 steps with a railing? : Total 6 Click Score: 6    End of Session   Activity Tolerance: Patient limited by fatigue;Other (comment) Patient left: in bed;with bed alarm set;with call bell/phone within reach;with family/visitor present Nurse Communication: Other (comment)       Time: 0950-1000 PT Time Calculation (min) (ACUTE ONLY): 10 min  Charges:  $Therapeutic Exercise: 8-22 mins                    G Codes:       Chesley Noon, PTA 06/19/17, 10:43 AM'

## 2017-06-19 NOTE — Consult Note (Signed)
Consultation Note Date: 06/19/2017   Patient Name: Michele Schultz  DOB: 09-30-1931  MRN: [id number]  Age / Sex: 83 y.o., female  PCP: Michele Cha, MD Referring Physician: Demetrios Loll, MD  Reason for Consultation: Establishing goals of care  HPI/Patient Profile: 83 y.o. female  with past medical history of chronic diastolic heart failure, CAD s/p CABG in 2012 and 2016, severe aortic stenosis s/p AVR 2012, complete heart block s/p pacemaker, HTN, HLD, Type 2 DM, nausea, fibromyalgia, and spinal stenosis admitted on 06/12/2017 with nausea and shortness of breath. In ED, chest xray revealed bilateral pleural effusions and congestion. Elevated BNP. Echo reported EF 20-25%. Now with combined systolic and diastolic CHF. IV lasix 62m BID due to hypotension. Per cardiology, she is not a candidate for right and left cardiac catheterization. Receiving reglan, famotidine, and sucralfate for chronic nausea. Palliative medicine consultation for goals of care.   Clinical Assessment and Goals of Care: I have reviewed medical records, discussed with care team, and met with patient and caregiver at bedside to discuss diagnosis, prognosis, GOC, EOL wishes, disposition and options. Patient awake, alert, pleasantly confused. Caregiver, MDwana Schultz answers most questions. After initial conversation, I spoke with niece (Michele Schultz via telephone. She is working and not able to meet with me today in person.   Introduced Palliative Medicine as specialized medical care for people living with serious illness. It focuses on providing relief from the symptoms and stress of a serious illness. The goal is to improve quality of life for both the patient and the family.  We discussed a brief life review of the patient. Widowed. No children. 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 Niece speaks of her decline in the last half a year with  transition to long-term care facility, weakness "basically bed bound", and recurrent hospitalizations. She has a poor appetite and often c/o of nausea.   Discussed hospital diagnoses, interventions, and multiple underlying co-morbidities. Educated on disease trajectory of CHF and risk for recurrent hospitalizations. Niece has a good understanding that she is NOT a candidate for invasive cardiac workup and will be managed with medications.   Advanced directives, concepts specific to code status, artifical feeding and hydration, and rehospitalization were considered and discussed. Per caregiver, nieces SFreda Munroand [name] documented HCPOA's. No documentation in chart. Encouraged SFreda Munroto bring documentation to hospital to be scanned into epic. SFreda Munroconfirms conversation with Dr. CBridgett Larssonyesterday-DNR/DNI.   Discussed guarded prognosis and eligibility for hospice services. Educated on palliative versus hospice services. Answered questions and concerns. SFreda Munrorequests more time to consider options and talk with family.    SUMMARY OF RECOMMENDATIONS    Confirmed DNR with niece. Encouraged she bring advanced directives/HCPOA documentation to hospital to scan in chart.   Discussed palliative versus hospice options on discharge. Niece requests more time to discuss hospice with patient/family.   PMT will continue to support patient/family through hospitalization.   Code Status/Advance Care Planning:  DNR  Symptom Management:   Per attending  Palliative Prophylaxis:  Aspiration, Delirium Protocol, Frequent Pain Assessment, Oral Care and Turn Reposition  Psycho-social/Spiritual:   Desire for further Chaplaincy support:yes  Additional Recommendations: Caregiving  Support/Resources and Education on Hospice  Prognosis:   Unable to determine: guarded with combined CHF with EF 20-25%, not a candidate for cardiac catheterization, extensive heart history (CABG, AS, CHB) and functional status  decline.   Discharge Planning: To Be Determined back to long term care facility with either palliative or hospice services.      Primary Diagnoses: Present on Admission: . Acute combined systolic and diastolic CHF, NYHA class 3 ([place]) . Complete heart block ([place]) . Coronary artery disease involving coronary bypass graft of native heart with angina pectoris ([place]) . Ischemic cardiomyopathy   I have reviewed the medical record, interviewed the patient and family, and examined the patient. The following aspects are pertinent.  Past Medical History:  Diagnosis Date  . Anemia   . Aortic stenosis, severe    a. s/p Magna Ease pericardial tissue valve size 21 mm replacement in 10/2011 for severe AS 11/12; b. echo 07/2015: EF 55-60% mod concentric LVH, GR1DD, LA mildly dilated, PASP 45 mm Hg  . Atrial tachycardia, paroxysmal (HCC)    a. with rate related LBBB.  . Cardiac pacemaker -st Judes    11/12  . Chronic diastolic heart failure ([place])    a. echo 2014: EF 55-60%, no RWMA, GR1DD, PASP 47 mm Hg; b. echo 07/2015: EF 55-60% mod concentric LVH, GR1DD, LA mildly dilated, PASP 45 mm Hg  . Complete heart block [facility])    a. s/p St Jude PPM 10/2011 (Ser # [id number]).  . Coronary artery disease    a. s/p 2v CABG 11/12 (VG-LAD, VG-OM1); b. Lexiscan 08/2015: low risk, no ischemia, EF 55-65%.  . Enthesopathy of hip region   . Esophageal reflux    followed by Dr.Seigal. stabilized with a combination of Nexium and Zantac  . Fibromyalgia   . HLD (hyperlipidemia)   . Hypertensive heart disease   . Knee joint replacement by other means   . Morbid obesity ([place])   . Neuralgia, neuritis, and radiculitis, unspecified   . Neuropathy   . Onychia and paronychia of toe   . Osteoarthrosis, unspecified whether generalized or localized, pelvic region and thigh    mainly in her back and knees  . PAF (paroxysmal atrial fibrillation) ([place])    a. brief episodes of AF previously noted on device interrogations.  [facility] RAD  (reactive airway disease)    a. chronic SOB  . Spinal stenosis   . Stress incontinence, female    followed by Dr.Cope  . Type II or unspecified type diabetes mellitus without mention of complication, not stated as uncontrolled    a. pt. reports that she is borderline   . Wide-complex tachycardia ([place])    a. Noted 4/10 - brief episode in ED. Noted again 4/21 in clinic->felt most likely to be atrial tach.   Social History   Social History  . Marital status: Widowed    Spouse name: N/A  . Number of children: N/A  . Years of education: N/A   Occupational History  . retired    Social History Main Topics  . Smoking status: Never Smoker  . Smokeless tobacco: Never Used  . Alcohol use No  . Drug use: No  . Sexual activity: No   Other Topics Concern  . None   Social History Narrative  . None   Family History  Problem Relation Age of Onset  .  Heart disease Sister   . Heart disease Brother   . Heart disease Brother   . Heart disease Brother   . Diabetes Other   . Anesthesia problems Neg Hx   . Hypotension Neg Hx   . Malignant hyperthermia Neg Hx   . Pseudochol deficiency Neg Hx    Scheduled Meds: . acidophilus  1 capsule Oral BID  . aspirin EC  81 mg Oral Daily  . atorvastatin  80 mg Oral Daily  . calcium carbonate  1 tablet Oral Daily  . cholecalciferol  1,000 Units Oral Daily  . docusate sodium  100 mg Oral BID  . enoxaparin (LOVENOX) injection  40 mg Subcutaneous Q24H  . famotidine  20 mg Oral BID  . furosemide  20 mg Intravenous BID AC  . gabapentin  100 mg Oral QHS  . lamoTRIgine  50 mg Oral BID  . LORazepam  0.5 mg Oral QHS  . losartan  25 mg Oral Daily  . magnesium hydroxide  10 mL Oral Daily  . magnesium oxide  400 mg Oral Daily  . metoCLOPramide  5 mg Oral TID AC & HS  . metoprolol succinate  25 mg Oral Daily  . potassium chloride SA  20 mEq Oral BID  . QUEtiapine  25 mg Oral BID  . sertraline  150 mg Oral QHS  . sodium chloride flush  3 mL Intravenous  Q12H  . sucralfate  1 g Oral TID WC & HS   Continuous Infusions: . sodium chloride     PRN Meds:.sodium chloride, acetaminophen, bisacodyl, HYDROcodone-acetaminophen, ipratropium-albuterol, nitroGLYCERIN, ondansetron (ZOFRAN) IV, ondansetron, polyethylene glycol powder, promethazine, sodium chloride flush Medications Prior to Admission:  Prior to Admission medications   Medication Sig Start Date End Date Taking? Authorizing Provider  acetaminophen (TYLENOL) 650 MG CR tablet Take 650 mg by mouth 3 (three) times daily.   Yes [provider]  acidophilus (RISAQUAD) CAPS capsule Take 1 capsule by mouth 2 (two) times daily. 06/26/16  Yes Bettey Costa, MD  aluminum-magnesium hydroxide-simethicone (MAALOX) 585-929-24 MG/5ML SUSP Take 30 mLs by mouth every 4 (four) hours as needed.   Yes [provider]  aspirin EC 81 MG tablet Take 81 mg by mouth daily.    Yes [provider]  famotidine (PEPCID) 20 MG tablet Take 1 tablet (20 mg total) by mouth 2 (two) times daily. 03/09/17  Yes Wieting, Richard, MD  gabapentin (NEURONTIN) 100 MG capsule Take 100 mg by mouth at bedtime.   Yes [provider]  lamoTRIgine (LAMICTAL) 25 MG tablet Take 50 mg by mouth 2 (two) times daily.   Yes [provider]  LORazepam (ATIVAN) 0.5 MG tablet Take 1 tablet (0.5 mg total) by mouth at bedtime. Patient taking differently: Take 0.5 mg by mouth every 12 (twelve) hours as needed for anxiety.  04/26/17  Yes Reed, Tiffany L, DO  magnesium hydroxide (MILK OF MAGNESIA) 400 MG/5ML suspension Take 10 mLs by mouth daily.    Yes [provider]  magnesium oxide (MAG-OX) 400 MG tablet Take 1 tablet (400 mg total) by mouth daily. 03/09/17  Yes Wieting, Richard, MD  metFORMIN (GLUCOPHAGE) 500 MG tablet Take 500 mg by mouth 2 (two) times daily with a meal.   Yes [provider]  metoCLOPramide (REGLAN) 5 MG tablet Take 1 tablet (5 mg total) by mouth 4 (four) times daily -  before  meals and at bedtime. Patient taking differently: Take 5 mg by mouth daily.  03/09/17  Yes Mineola,  Richard, MD  ondansetron (ZOFRAN) 4 MG tablet Take 4 mg by mouth every 6 (six) hours as needed for nausea or vomiting.    Yes [provider]  polyethylene glycol powder (GLYCOLAX/MIRALAX) powder Take 255 g by mouth daily. Mix entire contents of miralax (255g) with 64 oz of gatorade in a pitcher.   Drink 8oz of mixture every 30 minutes until you have multiple loose bowel movements. 03/16/17  Yes Harvest Dark, MD  potassium chloride SA (K-DUR,KLOR-CON) 20 MEQ tablet Take 20 mEq by mouth 2 (two) times daily.   Yes [provider]  promethazine (PHENERGAN) 12.5 MG tablet Take 1 tablet (12.5 mg total) by mouth every 6 (six) hours as needed for nausea. Patient taking differently: Take 12.5 mg by mouth every 4 (four) hours as needed for nausea.  03/09/17  Yes Wieting, Richard, MD  QUEtiapine (SEROQUEL) 25 MG tablet Take 25 mg by mouth 2 (two) times daily.   Yes [provider]  diltiazem (CARDIZEM CD) 180 MG 24 hr capsule Take 1 capsule (180 mg total) by mouth daily. Patient not taking: Reported on 06/13/2017 11/06/16   Gladstone Lighter, MD  furosemide (LASIX) 20 MG tablet Take 1 tablet (20 mg total) by mouth daily. Patient not taking: Reported on 06/13/2017 03/09/17   Loletha Grayer, MD  HYDROcodone-acetaminophen [facility]) 10-325 MG tablet Take 1 tablet by mouth every 4 (four) hours as needed for moderate pain. Do not exceed 3gm of Tylenol in 24 hours Patient not taking: Reported on 06/13/2017 03/09/17   Loletha Grayer, MD  nitroGLYCERIN (NITROSTAT) 0.4 MG SL tablet Place 1 tablet (0.4 mg total) under the tongue every 5 (five) minutes as needed for chest pain. Patient not taking: Reported on 06/13/2017 01/24/13   Minna Merritts, MD  Potassium Chloride ER 20 MEQ TBCR Take 20 mEq by mouth 2 (two) times daily. Patient not taking: Reported on 06/13/2017 03/09/17   Loletha Grayer, MD    Allergies  Allergen Reactions  . Citalopram Other (See Comments)    Reaction:  Altered mental status   . Cymbalta [Duloxetine Hcl] Other (See Comments)    Reaction:  Sedative for pt   . Imipramine Other (See Comments)    Reaction:  Unknown   . Proton Pump Inhibitors Other (See Comments)    Reaction:  Unknown   . Venlafaxine Nausea And Vomiting and Other (See Comments)    Reaction:  Dizziness    Review of Systems  Constitutional: Positive for activity change and appetite change.  Respiratory: Positive for shortness of breath.   Cardiovascular: Positive for chest pain.   Physical Exam  Constitutional: She is cooperative. She appears ill.  HENT:  Head: Normocephalic and atraumatic.  Cardiovascular: Exam reveals distant heart sounds.   Pulmonary/Chest: Effort normal. She has decreased breath sounds.  Abdominal: Normal appearance and bowel sounds are normal. There is no tenderness.  Neurological: She is alert.  Pleasantly confused  Skin: Skin is warm and dry.  Psychiatric: She has a normal mood and affect. Her speech is normal and behavior is normal.  Nursing note and vitals reviewed.  Vital Signs: BP (!) 94/44 (BP Location: Left Arm)   Pulse (!) 109   Temp 98.7 F (37.1 C) (Oral)   Resp 18   Ht 5' 4"  (1.626 m)   Wt 95.6 kg (210 lb 11.2 oz)   SpO2 95%   BMI 36.17 kg/m  Pain Assessment: No/denies pain POSS *See Group Information*: S-Acceptable,Sleep, easy to arouse Pain Score: Asleep  SpO2:  SpO2: 95 % O2 Device:SpO2: 95 % O2 Flow Rate: .   IO: Intake/output summary:   Intake/Output Summary (Last 24 hours) at 06/19/17 9409 Last data filed at 06/19/17 0900  Gross per 24 hour  Intake              720 ml  Output              700 ml  Net               20 ml    LBM: Last BM Date: 06/18/17 Baseline Weight: Weight: 86.2 kg (190 lb) Most recent weight: Weight: 95.6 kg (210 lb 11.2 oz)     Palliative Assessment/Data: PPS 30%   Flowsheet Rows     Most Recent  Value  Intake Tab  Referral Department  Hospitalist  Unit at Time of Referral  Cardiac/Telemetry Unit  Palliative Care Primary Diagnosis  Cardiac  Palliative Care Type  New Palliative care  Reason for referral  Clarify Goals of Care  Date of Admission  06/13/17  Date first seen by Palliative Care  06/19/17  Clinical Assessment  Palliative Performance Scale Score  30%  Psychosocial & Spiritual Assessment  Palliative Care Outcomes  Patient/Family meeting held?  Yes  Who was at the meeting?  patient/caregiver and niece via telephone  Palliative Care Outcomes  Clarified goals of care, Provided end of life care assistance, Provided psychosocial or spiritual support, ACP counseling assistance, Counseled regarding hospice      Time In: 0900 Time Out: 1020 Time Total: 49mn Greater than 50%  of this time was spent counseling and coordinating care related to the above assessment and plan.  Signed by:  MIhor Dow FNP-C Palliative Medicine Team  Phone: [phone]: [phone]  Please contact Palliative Medicine Team phone at [phone] questions and concerns.  For individual provider: See AShea Evans

## 2017-06-19 NOTE — Care Management (Signed)
Barrier- increased sx of fluid over load requiring IV lasix but had to be reduced due to hypotension. Decided not to pursue cardiac cath.  Palliative consult in progress and anticipate patient will benefit from palliative following as outpatient. Stopped Entresto due to hypotension

## 2017-06-20 MED ORDER — FUROSEMIDE 10 MG/ML IJ SOLN
40.0000 mg | Freq: Three times a day (TID) | INTRAMUSCULAR | Status: DC
  Administered 2017-06-20 – 2017-06-21 (×3): 40 mg via INTRAVENOUS
  Filled 2017-06-20 (×3): qty 4

## 2017-06-20 NOTE — Progress Notes (Addendum)
Physical Therapy Treatment Patient Details Name: Michele Schultz MRN: [id number] DOB: Jan 30, 1931 Today's Date: 06/20/2017    History of Present Illness Michele Schultz is an 83yo black female who comes t [facility] on 7/10 c emesis and SOB, imaging revealing of bilat pleural effusion/congestion, admitted for CHF exacerbation. PMH: anemia, aortic stenosis, atrial tachycardia, PPM, DCHF, CAD s/p CBAGx2, HTN, HLD, B THA, B TKA, fibromyalgia, RA, and Vertigo. LVEF: 20-25%. At baseline, pt lives longterm SNF, [facility] for facility mobility, self propels 25-50% of time, requirs assist for ADL/IADL, and mod-max assist for bed mobility.     PT Comments    Pt agreeable to PT and wishes out of bed if possible. Denies any new pain; notes chronic aches "all over". Pt participates in supine and seated exercises with instruction for technique and assist as needed. Mod A x 2 for supine to sit; pt does give good effort, but requires sequence cues and encouragement. Pt able to sit with fair balance. Pt does note dizziness (room spinning) initially with sit; blood pressure checked (126/72). Dizziness eases with time. Two attempts at stand with initial attempt using rolling walker and Mod A x 2; pt tolerates 30 seconds static stand, but unable to take steps. Pt ultimately feels urgent need to sit due to weakness. Second stand and stand pivot to chair with Mod A x 2 to stand and Max A for pivot. Continue PT to progress strength and endurance to improve pt assist with functional mobility.    Follow Up Recommendations  SNF;Supervision for mobility/OOB     Equipment Recommendations       Recommendations for Other Services       Precautions / Restrictions Precautions Precautions: Fall Restrictions Weight Bearing Restrictions: No    Mobility  Bed Mobility Overal bed mobility: Needs Assistance Bed Mobility: Supine to Sit     Supine to sit: Mod assist;+2 for physical assistance     General bed mobility comments: Cues  for sequence, manages LEs fairly well; heavy assist to intiate trunk elevation with improved pt participation final half of upright positioning. Increased time/effort to scoot to edge of bed in sit  Transfers Overall transfer level: Needs assistance Equipment used: Rolling walker (2 wheeled) (second with 2 person assist lift) Transfers: Sit to/from [facility] Sit to Stand: Mod assist;+2 safety/equipment Stand pivot transfers: Mod assist;+2 safety/equipment       General transfer comment: Pt initially stand with rw with stand tolerance of 30 seconds; unable to take steps. Second stand with 2 person assist/lift and pivot to chair  Ambulation/Gait             General Gait Details: unable   Stairs            Wheelchair Mobility    Modified Rankin (Stroke Patients Only)       Balance Overall balance assessment: Needs assistance Sitting-balance support: Bilateral upper extremity supported;Feet supported Sitting balance-Leahy Scale: Fair     Standing balance support: Bilateral upper extremity supported Standing balance-Leahy Scale: Poor                              Cognition Arousal/Alertness: Awake/alert Behavior During Therapy: WFL for tasks assessed/performed Overall Cognitive Status: Within Functional Limits for tasks assessed  Exercises General Exercises - Lower Extremity Ankle Circles/Pumps: AROM;Both;20 reps;Supine Quad Sets: Strengthening;Both;20 reps;Supine Gluteal Sets: Strengthening;Both;20 reps;Supine Long Arc Quad: AAROM;Both;10 reps;Seated Hip Flexion/Marching: AAROM;Both;10 reps;Seated    General Comments        Pertinent Vitals/Pain Pain Assessment:  (General aches all over)    Home Living                      Prior Function            PT Goals (current goals can now be found in the care plan section) Progress towards PT goals: Progressing  toward goals    Frequency    Min 2X/week      PT Plan Current plan remains appropriate    Co-evaluation              AM-PAC PT "6 Clicks" Daily Activity  Outcome Measure  Difficulty turning over in bed (including adjusting bedclothes, sheets and blankets)?: Total Difficulty moving from lying on back to sitting on the side of the bed? : Total Difficulty sitting down on and standing up from a chair with arms (e.g., wheelchair, bedside commode, etc,.)?: Total Help needed moving to and from a bed to chair (including a wheelchair)?: Total Help needed walking in hospital room?: Total Help needed climbing 3-5 steps with a railing? : Total 6 Click Score: 6    End of Session Equipment Utilized During Treatment: Gait belt Activity Tolerance: Patient limited by fatigue (weakness) Patient left: in chair;with call bell/phone within reach;with chair alarm set;with nursing/sitter in room   PT Visit Diagnosis: Muscle weakness (generalized) (M62.81);Other abnormalities of gait and mobility (R26.89)     Time: 3267-1245 PT Time Calculation (min) (ACUTE ONLY): 35 min  Charges:  $Therapeutic Exercise: 8-22 mins $Therapeutic Activity: 8-22 mins                    G Codes:        Larae Grooms, PTA 06/20/2017, 11:35 AM

## 2017-06-20 NOTE — Progress Notes (Signed)
Patient Name: Michele Schultz Date of Encounter: 06/20/2017  Primary Cardiologist: [facility] Problem List     Principal Problem:   Acute combined systolic and diastolic CHF, NYHA class 3 (HCC) Active Problems:   Complete heart block (HCC)   S/P CABG x 2   Aortic stenosis - s/p AVR   Coronary artery disease involving coronary bypass graft of native heart with angina pectoris (HCC)   S/P AVR (aortic valve replacement)   Ischemic cardiomyopathy   Goals of care, counseling/discussion     Subjective   Remains SOB and with orthopnea. Weight down 12 pounds for the admission. Renal function stable. Restarted on IV Lasix 20 mg bid on 7/16 with good UOP.   Inpatient Medications    Scheduled Meds: . acidophilus  1 capsule Oral BID  . aspirin EC  81 mg Oral Daily  . atorvastatin  80 mg Oral Daily  . calcium carbonate  1 tablet Oral Daily  . cholecalciferol  1,000 Units Oral Daily  . docusate sodium  100 mg Oral BID  . enoxaparin (LOVENOX) injection  40 mg Subcutaneous Q24H  . famotidine  20 mg Oral BID  . furosemide  20 mg Intravenous BID AC  . gabapentin  100 mg Oral QHS  . lamoTRIgine  50 mg Oral BID  . LORazepam  0.5 mg Oral QHS  . losartan  25 mg Oral Daily  . magnesium hydroxide  10 mL Oral Daily  . magnesium oxide  400 mg Oral Daily  . metoCLOPramide  5 mg Oral TID AC & HS  . metoprolol succinate  25 mg Oral Daily  . potassium chloride SA  20 mEq Oral BID  . QUEtiapine  25 mg Oral BID  . sertraline  150 mg Oral QHS  . sodium chloride flush  3 mL Intravenous Q12H  . sucralfate  1 g Oral TID WC & HS   Continuous Infusions: . sodium chloride     PRN Meds: sodium chloride, acetaminophen, bisacodyl, HYDROcodone-acetaminophen, ipratropium-albuterol, nitroGLYCERIN, ondansetron (ZOFRAN) IV, ondansetron, polyethylene glycol powder, promethazine, sodium chloride flush   Vital Signs    Vitals:   06/20/17 0407 06/20/17 0410 06/20/17 0905 06/20/17 0907  BP:  (!)  109/43 125/84   Pulse:  (!) 109 61 91  Resp:  16    Temp:  98.6 F (37 C)    TempSrc:      SpO2:  100% 98%   Weight: 209 lb 3.2 oz (94.9 kg)     Height:        Intake/Output Summary (Last 24 hours) at 06/20/17 0948 Last data filed at 06/20/17 0100  Gross per 24 hour  Intake              360 ml  Output              600 ml  Net             -240 ml   Filed Weights   06/18/17 0613 06/19/17 0600 06/20/17 0407  Weight: 213 lb 3.2 oz (96.7 kg) 210 lb 11.2 oz (95.6 kg) 209 lb 3.2 oz (94.9 kg)    Physical Exam    GEN: Well nourished, well developed, in no acute distress.  HEENT: Grossly normal.  Neck: Supple, JVD elevated ~ 8 cm, no carotid bruits, or masses. Cardiac: RRR, II/VI systolic murmur, no rubs, or gallops. No clubbing, cyanosis, edema.  Radials/DP/PT 2+ and equal bilaterally.  Respiratory:  Diminished breath sounds bilaterally.  GI: Soft, nontender, nondistended, BS + x 4. MS: no deformity or atrophy. Skin: warm and dry, no rash. Neuro:  Strength and sensation are intact. Psych: AAOx3.  Normal affect.  Labs    CBC No results for input(s): WBC, NEUTROABS, HGB, HCT, MCV, PLT in the last 72 hours. Basic Metabolic Panel  Recent Labs  06/19/17 0610  CREATININE 0.70   Liver Function Tests No results for input(s): AST, ALT, ALKPHOS, BILITOT, PROT, ALBUMIN in the last 72 hours. No results for input(s): LIPASE, AMYLASE in the last 72 hours. Cardiac Enzymes No results for input(s): CKTOTAL, CKMB, CKMBINDEX, TROPONINI in the last 72 hours. BNP Invalid input(s): POCBNP D-Dimer No results for input(s): DDIMER in the last 72 hours. Hemoglobin A1C No results for input(s): HGBA1C in the last 72 hours. Fasting Lipid Panel No results for input(s): CHOL, HDL, LDLCALC, TRIG, CHOLHDL, LDLDIRECT in the last 72 hours. Thyroid Function Tests No results for input(s): TSH, T4TOTAL, T3FREE, THYROIDAB in the last 72 hours.  Invalid input(s): FREET3  Telemetry    V paced -  Personally Reviewed  ECG    n/a - Personally Reviewed  Radiology    No results found.  Cardiac Studies   TTE 06/13/2017: Study Conclusions  - Left ventricle: The cavity size was moderately dilated. Systolic function was severely reduced. The estimated ejection fraction was in the range of 20% to 25%. Diffuse hypokinesis. - Aortic valve: There was trivial regurgitation. Valve area (VTI): 1.06 cm^2. Valve area (Vmax): 0.89 cm^2. Valve area (Vmean): 0.95 cm^2. - Mitral valve: Calcified annulus. Mildly thickened leaflets . There was severe regurgitation. - Left atrium: The atrium was mildly dilated. - Right atrium: The atrium was mildly dilated. - Tricuspid valve: There was moderate regurgitation.  Impressions:  - The right ventricular systolic pressure was increased consistent with moderate pulmonary hypertension.  Patient Profile     83 y.o. female with history of CAD (s/p CABG in 10/2011 with SVG-LAD and SVG-OM1 with low-risk NST in 08/2015), severe AS (s/p AVR in 2012), chronic diastolic CHF, CHB (s/p PPM placement in 10/2011), PAT, HTN, HLD, Type 2 DM, and persistent nausea (thought secondary to gastroparesis) who was admittedfor the evaluation of CHF.  Assessment & Plan    1. Acute systolic CHF/acute on chronic diastolic CHF/dilated cardiomyopathy: -Newly reduced EF as above -The patient initially had improvement in symptoms but she appeared to be worse on 7/16, with possible volume overload causing coughing and chest pain. She was resumed on IV furosemide 20 mg twice daily with improving symptoms -Increase IV Lasix to 40 mg tid with KCl repletion -Continue Toprol -Entresto was discontinued due to hypotension -Continue small dose losartan -Consider recheck CXR  2. CAD: -S/p CABG in 10/2011 with SVG-LAD and SVG-OM1 with low-risk NST in 08/2015 -No anginal symptoms -Troponins negative -ASA and statin - The patient's overall functional capacity is  poor and she has been felt to be a poor candidate for a right and left cardiac catheterization. She is agreeable to medical management at this time  3. AS: -As above -Ischemic evaluation as above  4. Complete heart block: -S/p PPM -Device appears to be functioning appropriately   5. Decreased appetite/gastroparesis: -Albumin 4.0 at admission -Per IM  Signed, Christell Faith, PA-C [place] Pager: [phone] 06/20/2017, 9:48 AM   Attending Note Patient seen and examined, agree with detailed note above,  Patient presentation and plan discussed on rounds.   Reports having continued shortness of breath, even at rest Working with physical  therapy this morning Profound leg weakness, dependent on assistance for transfers Tolerating Lasix 20 IV twice a day, no significant change in renal function 4.5 L out negative  On physical exam unable to estimate JVD, lungs with rales at the bases, abdomen obese soft nontender, no significant lower extremity edema  Lab work reviewed showing normal renal function, so 0.72, potassium 4.0  --- Acute on chronic diastolic and systolic CHF Obesity hypoventilation Given normal renal function, would increase Lasix up to 40 IV twice a day She had severe pulmonary hypertension in early 2018 on echocardiogram -At the time of discharge will likely need Lasix 40 by mouth twice a day. Was previously on 20 daily  Would reevaluate tomorrow for potential discharge  ---CAD Does not appear to have active ischemia No further workup needed  Greater than 50% was spent in counseling and coordination of care with patient Total encounter time 25 minutes or more   Signed: Esmond Plants  M.D., Ph.D. [facility]

## 2017-06-20 NOTE — Progress Notes (Signed)
[place] [facility] [place] NAME: Michele Schultz    MR#:  [id number]  DATE OF BIRTH:  06-Jan-1931  SUBJECTIVE:still has sob.increased IV lasix. Today.hypotension improved.  CHIEF COMPLAINT:   Chief Complaint  Patient presents with  . Nausea  . Emesis   Came with SOB.   Have significant drop in EF%   Complain of nausea with her food for last 2 months which is not changed.  The patient still complains of cough, SOB, chest pain, nausea and generalized weakness. BP was 86/42 this am. REVIEW OF SYSTEMS:   CONSTITUTIONAL: generalized weakness.  EYES: No blurred or double vision.  EARS, NOSE, AND THROAT: No tinnitus or ear pain.  RESPIRATORY: has cough and shortness of breath, No wheezing or hemoptysis.  CARDIOVASCULAR: has chest pain,  has orthopnea, edema.  GASTROINTESTINAL: has nausea,no vomiting, diarrhea or abdominal pain.  GENITOURINARY: No dysuria, hematuria.  ENDOCRINE: No polyuria, nocturia,  HEMATOLOGY: No anemia, easy bruising or bleeding SKIN: No rash or lesion. MUSCULOSKELETAL: No joint pain or arthritis.   NEUROLOGIC: No tingling, numbness, weakness.  PSYCHIATRY: No anxiety or depression.   ROS  DRUG ALLERGIES:   Allergies  Allergen Reactions  . Citalopram Other (See Comments)    Reaction:  Altered mental status   . Cymbalta [Duloxetine Hcl] Other (See Comments)    Reaction:  Sedative for pt   . Imipramine Other (See Comments)    Reaction:  Unknown   . Proton Pump Inhibitors Other (See Comments)    Reaction:  Unknown   . Venlafaxine Nausea And Vomiting and Other (See Comments)    Reaction:  Dizziness     VITALS:  Blood pressure (!) 104/48, pulse (!) 109, temperature 97.8 F (36.6 C), temperature source Oral, resp. rate 20, height 5\' 4"  (1.626 m), weight 94.9 kg (209 lb 3.2 oz), SpO2 100 %.  PHYSICAL EXAMINATION:   GENERAL:  83 y.o.-year-old patient lying in the bed with no acute distress. Obese. EYES: Pupils equal,  round, reactive to light and accommodation. No scleral icterus. Extraocular muscles intact.  HEENT: Head atraumatic, normocephalic. Oropharynx and nasopharynx clear.  NECK:  Supple, no jugular venous distention. No thyroid enlargement, no tenderness.  LUNGS: Decreased breath sounds bilaterally, bibasilar crepitations heard. No use of accessory muscles of respiration.  CARDIOVASCULAR: S1, S2 normal. No murmurs, rubs, or gallops.  ABDOMEN: Soft, nontender, nondistended. Bowel sounds present. No organomegaly or mass.  EXTREMITIES: Has pedal edema, no cyanosis, or clubbing.  NEUROLOGIC: Cranial nerves II through XII are intact. Muscle strength 3/5 in all extremities. Sensation intact. Gait not checked.  PSYCHIATRIC: The patient is alert,awake. SKIN: No obvious rash, lesion, or ulcer.   Physical Exam LABORATORY PANEL:   CBC No results for input(s): WBC, HGB, HCT, PLT in the last 168 hours. ------------------------------------------------------------------------------------------------------------------  Chemistries   Recent Labs Lab 06/16/17 0531 06/17/17 0505 06/19/17 0610  NA 139 138  --   K 4.0 4.0  --   CL 106 105  --   CO2 26 27  --   GLUCOSE 112* 119*  --   BUN 17 16  --   CREATININE 0.70 0.72 0.70  CALCIUM 9.2 9.1  --   MG 2.1  --   --    ------------------------------------------------------------------------------------------------------------------  Cardiac Enzymes No results for input(s): TROPONINI in the last 168 hours. ------------------------------------------------------------------------------------------------------------------  RADIOLOGY:  No results found.  ASSESSMENT AND PLAN:   Principal Problem:   Acute combined systolic and diastolic CHF, NYHA class 3 (  [place]) Active Problems:   Complete heart block (HCC)   S/P CABG x 2   Aortic stenosis - s/p AVR   Coronary artery disease involving coronary bypass graft of native heart with angina pectoris (HCC)    S/P AVR (aortic valve replacement)   Ischemic cardiomyopathy   Goals of care, counseling/discussion  * Acute combined systolic and diastolic CHF, NYHA class 3 ([place]), LV EF: 20% -   25%   Drop in EF   IV lasix- increased dose to 40 mg IV TID.hypotension improved  Discontinued entresto  Due to hypotension.,conntinue losartan,     Appreciated cardio consult.      * CAD   ASA, atorvastatin, metoprolol.  * dilated cardiomyopathy  .  * complete heart block   Paced rhythm.  * nausea- chronic complain.   On Reglan, famotidine. added sucralfate   Possible discharge tomorrow with po lasix. The patient has poor prognosis. May need palliative care consult. All the records are reviewed and case discussed with Care Management/Social Workerr. Management plans discussed with the patient and his niece and they are in agreement.  CODE STATUS: DNR. TOTAL TIME TAKING CARE OF THIS PATIENT: 38 minutes.   POSSIBLE D/C IN 2 DAYS, DEPENDING ON CLINICAL CONDITION.   Epifanio Lesches M.D on 06/20/2017   Between 7am to 6pm - Pager - [phone]  After 6pm go to [email or web address] - password [email or web address] [facility]  Office  [phone]  CC: Primary care physician; [name], MD  Note: This dictation was prepared with Dragon dictation along with smaller phrase technology. Any transcriptional errors that result from this process are unintentional.

## 2017-06-21 LAB — GLUCOSE, CAPILLARY: GLUCOSE-CAPILLARY: 122 mg/dL — AB (ref 65–99)

## 2017-06-21 NOTE — Progress Notes (Signed)
Physical Therapy Treatment Patient Details Name: Michele Schultz MRN: [id number] DOB: 08-Apr-1931 Today's Date: 06/21/2017    History of Present Illness Michele Schultz is an 83yo black female who comes t [facility] on 7/10 c emesis and SOB, imaging revealing of bilat pleural effusion/congestion, admitted for CHF exacerbation. PMH: anemia, aortic stenosis, atrial tachycardia, PPM, DCHF, CAD s/p CBAGx2, HTN, HLD, B THA, B TKA, fibromyalgia, RA, and Vertigo. LVEF: 20-25%. At baseline, pt lives longterm SNF, [facility] for facility mobility, self propels 25-50% of time, requirs assist for ADL/IADL, and mod-max assist for bed mobility.     PT Comments    Pt agreeable to PT for supine exercises. Pt reports chronic pain from fibromyalgia. Pt participates well with supine exercises with cues for technique and encouragement for consistent movement/range. Pt does not wish up in chair at this time. Continue PT to progress endurance, strength to improve ability to assist with transfers.    Follow Up Recommendations        Equipment Recommendations       Recommendations for Other Services       Precautions / Restrictions Precautions Precautions: Fall Restrictions Weight Bearing Restrictions: No    Mobility  Bed Mobility               General bed mobility comments: Not tested; pt refused out of bed at this time  Transfers                    Ambulation/Gait                 Stairs            Wheelchair Mobility    Modified Rankin (Stroke Patients Only)       Balance                                            Cognition Arousal/Alertness: Awake/alert Behavior During Therapy: WFL for tasks assessed/performed Overall Cognitive Status: Within Functional Limits for tasks assessed                                        Exercises General Exercises - Lower Extremity Ankle Circles/Pumps: AROM;Both;20 reps;Supine Quad Sets:  Strengthening;Both;20 reps;Supine Gluteal Sets: Strengthening;Both;20 reps;Supine Short Arc Quad: AROM;Both;20 reps;Supine Heel Slides: AAROM;Both;20 reps;Supine Hip ABduction/ADduction: AAROM;Both;20 reps;Supine Straight Leg Raises: AAROM;Both;10 reps;Supine    General Comments        Pertinent Vitals/Pain Pain Assessment:  (General aches (fibromyalgia))    Home Living                      Prior Function            PT Goals (current goals can now be found in the care plan section) Progress towards PT goals: Progressing toward goals (slow)    Frequency    Min 2X/week      PT Plan Current plan remains appropriate    Co-evaluation              AM-PAC PT "6 Clicks" Daily Activity  Outcome Measure  Difficulty turning over in bed (including adjusting bedclothes, sheets and blankets)?: Total Difficulty moving from lying on back to sitting on the side of the bed? : Total Difficulty sitting down on and  standing up from a chair with arms (e.g., wheelchair, bedside commode, etc,.)?: Total Help needed moving to and from a bed to chair (including a wheelchair)?: Total Help needed walking in hospital room?: Total Help needed climbing 3-5 steps with a railing? : Total 6 Click Score: 6    End of Session   Activity Tolerance: Patient tolerated treatment well Patient left: in bed;with call bell/phone within reach;with bed alarm set   PT Visit Diagnosis: Muscle weakness (generalized) (M62.81);Other abnormalities of gait and mobility (R26.89)     Time: 1641-1700 PT Time Calculation (min) (ACUTE ONLY): 19 min  Charges:  $Therapeutic Exercise: 8-22 mins                    G Codes:        Larae Grooms, PTA 06/21/2017, 5:01 PM

## 2017-06-21 NOTE — Progress Notes (Signed)
[facility] [place] NAME: Michele Schultz    MR#:  [id number]  DATE OF BIRTH:  [birth year]  SUBJECTIVE: Hypotensive today and she says she does not feel well. Sitting in the chair.   CHIEF COMPLAINT:   Chief Complaint  Patient presents with  . Nausea  . Emesis   Came with SOB.   Have significant drop in EF%   Complain of nausea with her food for last 2 months which is not changed.   REVIEW OF SYSTEMS:   CONSTITUTIONAL: generalized weakness.  EYES: No blurred or double vision.  EARS, NOSE, AND THROAT: No tinnitus or ear pain.  RESPIRATORY: has cough and shortness of breath, No wheezing or hemoptysis.  CARDIOVASCULAR: no chest pain,  has orthopnea, edema.  GASTROINTESTINAL: has nausea,no vomiting, diarrhea or abdominal pain.  GENITOURINARY: No dysuria, hematuria.  ENDOCRINE: No polyuria, nocturia,  HEMATOLOGY: No anemia, easy bruising or bleeding SKIN: No rash or lesion. MUSCULOSKELETAL: No joint pain or arthritis.   NEUROLOGIC: No tingling, numbness, weakness.  PSYCHIATRY: No anxiety or depression.   ROS  DRUG ALLERGIES:   Allergies  Allergen Reactions  . Citalopram Other (See Comments)    Reaction:  Altered mental status   . Cymbalta [Duloxetine Hcl] Other (See Comments)    Reaction:  Sedative for pt   . Imipramine Other (See Comments)    Reaction:  Unknown   . Proton Pump Inhibitors Other (See Comments)    Reaction:  Unknown   . Venlafaxine Nausea And Vomiting and Other (See Comments)    Reaction:  Dizziness     VITALS:  Blood pressure 99/63, pulse (!) 115, temperature 98.5 F (36.9 C), temperature source Oral, resp. rate 18, height 5\' 4"  (1.626 m), weight 95.3 kg (210 lb), SpO2 100 %.  PHYSICAL EXAMINATION:   GENERAL:  83 y.o.-year-old patient lying in the bed with no acute distress. Obese., EYES: Pupils equal, round, reactive to light and accommodation. No scleral icterus. Extraocular muscles intact.  HEENT:  Head atraumatic, normocephalic. Oropharynx and nasopharynx clear.  NECK:  Supple, no jugular venous distention. No thyroid enlargement, no tenderness.  LUNGS: Decreased breath sounds bilaterally, bibasilar crepitations heard. No use of accessory muscles of respiration.  CARDIOVASCULAR: S1, S2 normal. No murmurs, rubs, or gallops.  ABDOMEN: Soft, nontender, nondistended. Bowel sounds present. No organomegaly or mass.  EXTREMITIES: Has pedal edema, no cyanosis, or clubbing.  NEUROLOGIC: Cranial nerves II through XII are intact. Muscle strength 3/5 in all extremities. Sensation intact. Gait not checked.  PSYCHIATRIC: The patient is alert,awake. SKIN: No obvious rash, lesion, or ulcer.   Physical Exam LABORATORY PANEL:   CBC No results for input(s): WBC, HGB, HCT, PLT in the last 168 hours. ------------------------------------------------------------------------------------------------------------------  Chemistries   Recent Labs Lab 06/16/17 0531 06/17/17 0505 06/19/17 0610  NA 139 138  --   K 4.0 4.0  --   CL 106 105  --   CO2 26 27  --   GLUCOSE 112* 119*  --   BUN 17 16  --   CREATININE 0.70 0.72 0.70  CALCIUM 9.2 9.1  --   MG 2.1  --   --    ------------------------------------------------------------------------------------------------------------------  Cardiac Enzymes No results for input(s): TROPONINI in the last 168 hours. ------------------------------------------------------------------------------------------------------------------  RADIOLOGY:  No results found.  ASSESSMENT AND PLAN:   Principal Problem:   Acute combined systolic and diastolic CHF, NYHA class 3 (HCC) Active Problems:   Complete heart block ([place])  S/P CABG x 2   Aortic stenosis - s/p AVR   Coronary artery disease involving coronary bypass graft of native heart with angina pectoris (HCC)   S/P AVR (aortic valve replacement)   Ischemic cardiomyopathy   Goals of care,  counseling/discussion  * Acute combined systolic and diastolic CHF, NYHA class 3 ([place]), LV EF: 20% -   25%   Drop in EF  hypotension: Continue to hold metoprolol, losartan, Lasix today and watch blood pressure.not stable for discharge because of hypotension and patient feeling weak.  Discontinued entresto  Due to hypotension.,conntinue losartan,     Appreciated cardio consult.      * CAD   ASA, atorvastatin, hold metoprolol because of hypotension.  * dilated cardiomyopathy  .  * complete heart block   Paced rhythm.  * nausea- chronic complain.   On Reglan, famotidine. added sucralfate    The patient has poor prognosis.  All the records are reviewed and case discussed with Care Management/Social Workerr. Management plans discussed with the patient and his niece and they are in agreement.  CODE STATUS: DNR. TOTAL TIME TAKING CARE OF THIS PATIENT: 38 minutes.   POSSIBLE D/C IN 2 DAYS, DEPENDING ON CLINICAL CONDITION.   Epifanio Lesches M.D on 06/21/2017   Between 7am to 6pm - Pager - [phone]  After 6pm go to [email or web address] - password [email or web address] [facility]  Office  [phone]  CC: Primary care physician; [name], MD  Note: This dictation was prepared with Dragon dictation along with smaller phrase technology. Any transcriptional errors that result from this process are unintentional.

## 2017-06-21 NOTE — Progress Notes (Signed)
   Patient hypotensive this morning. Recommend holding losartan, metoprolol, and Lasix. Continue to reassess.

## 2017-06-21 NOTE — Progress Notes (Signed)
Chart reviewed. VM left for Niece/POA Gerarda Fraction earlier to further discuss palliative and hospice options. No return call. Patient sleeping comfortably upon arrival to room. No signs or symptoms of distress. If discharged tomorrow, I would recommend palliative services to follow at SNF on discharge.   NO CHARGE  Ihor Dow, FNP-C Palliative Medicine Team  Phone: [phone] Fax: [phone]

## 2017-06-21 NOTE — Clinical Social Work Note (Addendum)
CSW was informed that patient is not medically ready for discharge now.  CSW updated [facility].  CSW to continue to follow patient's progress throughout discharge planning.  Per palliative team, palliative to follow patient at SNF.  Jones Broom. [place], MSW, [place]  06/21/2017 2:36 PM

## 2017-06-21 NOTE — Progress Notes (Signed)
Chaplain visited with patient on rounds. Provided spiritual presence and words of encouragement.     06/21/17 1710  Clinical Encounter Type  Visited With Patient  Visit Type Initial;Spiritual support  Referral From Chaplain  Consult/Referral To Chaplain  Spiritual Encounters  Spiritual Needs Other (Comment)

## 2017-06-22 DIAGNOSIS — I959 Hypotension, unspecified: Secondary | ICD-10-CM

## 2017-06-22 LAB — BASIC METABOLIC PANEL
Anion gap: 9 (ref 5–15)
BUN: 26 mg/dL — AB (ref 6–20)
CALCIUM: 9.3 mg/dL (ref 8.9–10.3)
CHLORIDE: 105 mmol/L (ref 101–111)
CO2: 24 mmol/L (ref 22–32)
CREATININE: 0.86 mg/dL (ref 0.44–1.00)
GFR calc non Af Amer: >60 mL/min (ref >60)
Glucose, Bld: 120 mg/dL — ABNORMAL HIGH (ref 65–99)
Potassium: 3.8 mmol/L (ref 3.5–5.1)
Sodium: 138 mmol/L (ref 135–145)

## 2017-06-22 MED ORDER — PREMIER PROTEIN SHAKE
11.0000 [oz_av] | Freq: Two times a day (BID) | ORAL | Status: DC
  Administered 2017-06-23 – 2017-06-24 (×2): 11 [oz_av] via ORAL

## 2017-06-22 MED ORDER — FUROSEMIDE 20 MG PO TABS
20.0000 mg | ORAL_TABLET | Freq: Two times a day (BID) | ORAL | Status: DC
  Administered 2017-06-22 – 2017-06-25 (×7): 20 mg via ORAL
  Filled 2017-06-22 (×7): qty 1

## 2017-06-22 NOTE — Progress Notes (Signed)
Daily Progress Note   Patient Name: Michele Schultz       Date: 06/22/2017 DOB: June 28, 1931  Age: 83 y.o. MRN#: [id number] Attending Physician: Epifanio Lesches, MD Primary Care Physician: [name], MD Admit Date: 06/12/2017  Reason for Consultation/Follow-up: Establishing goals of care  Subjective/GOC: Patient awake, alert, and oriented. Caregiver at bedside. Tells she feels "ok" and needs to have a bowel movement today. Hopeful to sit in the chair today.   Spoke with niece ([place] per caregiver), Michele Schultz, via telephone. Again discussed diagnoses, interventions, and guarded prognosis with heart failure--not a candidate for invasive interventions and challenging with cardiac meds and soft blood pressures. Educated on trajectory of CHF and on high risk for recurrent hospitalization. Educated on palliative versus hospice services to follow at facility. Receptive with palliative services to follow on discharge understanding this can transition to hospice services when the family is ready.   Answered questions and concerns.   Length of Stay: 10  Current Medications: Scheduled Meds:  . acidophilus  1 capsule Oral BID  . aspirin EC  81 mg Oral Daily  . atorvastatin  80 mg Oral Daily  . calcium carbonate  1 tablet Oral Daily  . cholecalciferol  1,000 Units Oral Daily  . docusate sodium  100 mg Oral BID  . enoxaparin (LOVENOX) injection  40 mg Subcutaneous Q24H  . famotidine  20 mg Oral BID  . gabapentin  100 mg Oral QHS  . lamoTRIgine  50 mg Oral BID  . LORazepam  0.5 mg Oral QHS  . magnesium hydroxide  10 mL Oral Daily  . magnesium oxide  400 mg Oral Daily  . metoCLOPramide  5 mg Oral TID AC & HS  . potassium chloride SA  20 mEq Oral BID  . QUEtiapine  25 mg Oral BID  .  sertraline  150 mg Oral QHS  . sodium chloride flush  3 mL Intravenous Q12H  . sucralfate  1 g Oral TID WC & HS    Continuous Infusions: . sodium chloride      PRN Meds: sodium chloride, acetaminophen, bisacodyl, HYDROcodone-acetaminophen, ipratropium-albuterol, nitroGLYCERIN, ondansetron (ZOFRAN) IV, ondansetron, polyethylene glycol powder, promethazine, sodium chloride flush  Physical Exam  Constitutional: She is oriented to person, place, and time. She is cooperative.  HENT:  Head: Normocephalic and atraumatic.  Cardiovascular:  Exam reveals distant heart sounds.   Pulmonary/Chest: Effort normal and breath sounds normal. No accessory muscle usage. No tachypnea. No respiratory distress.  Abdominal: Normal appearance.  Neurological: She is alert and oriented to person, place, and time.  Pleasantly confused  Skin: Skin is warm and dry.  Psychiatric: She has a normal mood and affect. Her speech is normal and behavior is normal.  Nursing note and vitals reviewed.          Vital Signs: BP 116/69 (BP Location: Right Arm)   Pulse (!) 104   Temp 98.4 F (36.9 C) (Oral)   Resp 18   Ht 5\' 4"  (1.626 m)   Wt 94.8 kg (209 lb)   SpO2 96%   BMI 35.87 kg/m  SpO2: SpO2: 96 % O2 Device: O2 Device: Not Delivered O2 Flow Rate:    Intake/output summary:   Intake/Output Summary (Last 24 hours) at 06/22/17 1324 Last data filed at 06/22/17 4627  Gross per 24 hour  Intake              240 ml  Output              900 ml  Net             -660 ml   LBM: Last BM Date: 06/20/17 Baseline Weight: Weight: 86.2 kg (190 lb) Most recent weight: Weight: 94.8 kg (209 lb)       Palliative Assessment/Data: PPS 40%   Flowsheet Rows     Most Recent Value  Intake Tab  Referral Department  Hospitalist  Unit at Time of Referral  Cardiac/Telemetry Unit  Palliative Care Primary Diagnosis  Cardiac  Date Notified  06/18/17  Palliative Care Type  New Palliative care  Reason for referral  Clarify  Goals of Care  Date of Admission  06/13/17  Date first seen by Palliative Care  06/19/17  # of days Palliative referral response time  1 Day(s)  # of days IP prior to Palliative referral  5  Clinical Assessment  Palliative Performance Scale Score  40%  Psychosocial & Spiritual Assessment  Palliative Care Outcomes  Patient/Family meeting held?  Yes  Who was at the meeting?  patient and niece Michele Schultz)  Palliative Care Outcomes  Clarified goals of care, Counseled regarding hospice, Provided end of life care assistance, Provided psychosocial or spiritual support, Linked to palliative care logitudinal support      Patient Active Problem List   Diagnosis Date Noted  . Goals of care, counseling/discussion   . Ischemic cardiomyopathy 06/16/2017  . Acute combined systolic and diastolic CHF, NYHA class 3 ([place]) 06/12/2017  . Nausea vomiting and diarrhea 03/05/2017  . Adjustment disorder with depressed mood 11/02/2016  . Nausea & vomiting 11/01/2016  . Nausea and vomiting   . Atrial fibrillation with RVR ([place]) 10/11/2016  . Hypokalemia 10/11/2016  . DNR (do not resuscitate) 09/19/2016  . Palliative care by specialist 09/19/2016  . Muscle weakness (generalized)   . Atrial tachycardia ([place])   . Diarrhea 09/17/2016  . Dehydration 09/17/2016  . Metabolic acidosis 03/50/0938  . Headache due to trauma 07/03/2016  . Wide-complex tachycardia ([place])   . Pain in the chest   . Emesis   . Weakness of both legs   . Coronary artery disease involving coronary bypass graft of native heart with angina pectoris ([place])   . S/P AVR (aortic valve replacement)   . Falls frequently   . HLD (hyperlipidemia)   . Fibromyalgia   .  RAD (reactive airway disease)   . Morbid obesity ([place])   . Aortic stenosis - s/p AVR   . Chronic pain 06/17/2014  . Pain in the muscles 06/19/2013  . Chest pain 06/19/2013  . Wheezing 06/19/2013  . Weakness generalized 06/03/2013  . Dyspnea 01/27/2013  . Nausea 03/05/2012  .  Constipation 03/05/2012  . Atrial fibrillation-non sustained 02/09/2012  . Cardiac pacemaker -[place]   . Chronic diastolic heart failure ([place])   . Coronary artery disease   . Long term (current) use of anticoagulants 01/03/2012  . S/P CABG x 2 11/14/2011  . S/P aortic valve replacement with porcine valve 11/14/2011  . Back pain 11/14/2011  . Complete heart block ([place]) 10/20/2011  . Hypertensive heart disease with CHF (congestive heart failure) ([place]) 09/15/2011  . Spinal stenosis 03/22/2011  . Fatigue 03/22/2011  . Hyperlipidemia 03/22/2011    Palliative Care Assessment & Plan   Patient Profile:  83 y.o. female  with past medical history of chronic diastolic heart failure, CAD s/p CABG in 2012 and 2016, severe aortic stenosis s/p AVR 2012, complete heart block s/p pacemaker, HTN, HLD, Type 2 DM, nausea, fibromyalgia, and spinal stenosis admitted on 06/12/2017 with nausea and shortness of breath. In ED, chest xray revealed bilateral pleural effusions and congestion. Elevated BNP. Echo reported EF 20-25%. Now with combined systolic and diastolic CHF. IV lasix 20mg  BID due to hypotension. Per cardiology, she is not a candidate for right and left cardiac catheterization. Receiving reglan, famotidine, and sucralfate for chronic nausea. Palliative medicine consultation for goals of care.   Assessment: Combined systolic and diastolic CHF Hypotension CAD Ischemic cardiomyopathy Hx of aortic stenosis s/p AVF Hx of complete heart block s/p pacemaker Nausea  Recommendations/Plan:  DNR  Could benefit from MOST form when niece available.   Palliative services to follow at SNF on discharge. Niece understands this can transition to hospice services when they are ready.   PMT not at [facility] over the weekend but will f/u with patient/family next week if still hospitalized.    Code Status: DNR   Code Status Orders        Start     Ordered   06/16/17 1338  Do not attempt resuscitation  (DNR)  Continuous    Question Answer Comment  In the event of cardiac or respiratory ARREST Do not call a "code blue"   In the event of cardiac or respiratory ARREST Do not perform Intubation, CPR, defibrillation or ACLS   In the event of cardiac or respiratory ARREST Use medication by any route, position, wound care, and other measures to relive pain and suffering. May use oxygen, suction and manual treatment of airway obstruction as needed for comfort.      06/16/17 1337    Code Status History    Date Active Date Inactive Code Status Order ID Comments User Context   06/13/2017  1:30 AM 06/16/2017  1:37 PM Full Code [id number]  Saundra Shelling, MD Inpatient   03/06/2017 12:32 AM 03/09/2017 10:02 PM Full Code [id number]  Max Sane, MD ED   03/05/2017 10:41 PM 03/06/2017 12:32 AM DNR [id number]  Max Sane, MD ED   11/01/2016  8:09 AM 11/06/2016  4:51 PM DNR [id number]  Epifanio Lesches, MD ED   10/12/2016  1:36 AM 10/21/2016  6:48 PM DNR [id number]  Lance Coon, MD Inpatient   09/19/2016 10:52 AM 09/22/2016  8:50 PM DNR [id number]  Knox Royalty, NP Inpatient   09/17/2016  5:57 PM 09/19/2016 10:52  AM Full Code [id number]  Theodoro Grist, MD Inpatient   06/23/2016  8:20 PM 06/26/2016  1:26 PM Full Code [id number]  Gladstone Lighter, MD Inpatient   06/16/2016  8:35 PM 06/21/2016 10:09 PM Full Code [id number]  Lytle Butte, MD ED   03/13/2016  8:15 PM 03/14/2016  9:02 PM Full Code [id number]  Henreitta Leber, MD Inpatient   09/13/2015  6:51 PM 09/18/2015  7:31 PM Full Code [id number]  Dustin Flock, MD Inpatient   10/20/2011  1:40 PM 10/22/2011  1:27 PM Full Code [id number]  Tiajuana Amass, RN Inpatient    Advance Directive Documentation     Most Recent Value  Type of Advance Directive  Healthcare Power of Attorney  Pre-existing out of facility DNR order (yellow form or pink MOST form)  -  "MOST" Form in Place?  -       Prognosis:   Unable to determine: guarded with combined CHF, not a candidate for  cardiac catheterization, extensive heart history, and functional status decline.   Discharge Planning:  Long term care facility with palliative services  Care plan was discussed with patient, caregiver, niece Michele Schultz), and Dr. Vianne Bulls  Thank you for allowing the Palliative Medicine Team to assist in the care of this patient.   Time In: 1300 Time Out: 1325 Total Time 25 Prolonged Time Billed  no      Greater than 50%  of this time was spent counseling and coordinating care related to the above assessment and plan.  Ihor Dow, FNP-C Palliative Medicine Team  Phone: [phone] Fax: [phone]  Please contact Palliative Medicine Team phone at [phone] for questions and concerns.

## 2017-06-22 NOTE — Progress Notes (Signed)
New referral for Palliative to follow at at [facility] received from [employer]. Pateint information faxed to referral. Thank you. Flo Shanks RN, BSN, [facility] [place], hospital liaison [phone] c

## 2017-06-22 NOTE — Progress Notes (Addendum)
[place] [facility] [place] NAME: Michele Schultz    MR#:  [id number]  DATE OF BIRTH:  Apr 24, 1931  SUBJECTIVE:  Hypotension improved today. Did not get any had a heart failure medication. Was hypotensive so we had to hold her CHF medicines. She feels better today.   CHIEF COMPLAINT:   Chief Complaint  Patient presents with  . Nausea  . Emesis   Came with SOB.   Have significant drop in EF%   Complain of nausea with her food for last 2 months which is not changed.   REVIEW OF SYSTEMS:   CONSTITUTIONAL: generalized weakness.  EYES: No blurred or double vision.  EARS, NOSE, AND THROAT: No tinnitus or ear pain.  RESPIRATORY: has cough and shortness of breath, No wheezing or hemoptysis.  CARDIOVASCULAR: no chest pain,  has orthopnea, edema.  GASTROINTESTINAL: has nausea,no vomiting, diarrhea or abdominal pain.  GENITOURINARY: No dysuria, hematuria.  ENDOCRINE: No polyuria, nocturia,  HEMATOLOGY: No anemia, easy bruising or bleeding SKIN: No rash or lesion. MUSCULOSKELETAL: No joint pain or arthritis.   NEUROLOGIC: No tingling, numbness, weakness.  PSYCHIATRY: No anxiety or depression.   ROS  DRUG ALLERGIES:   Allergies  Allergen Reactions  . Citalopram Other (See Comments)    Reaction:  Altered mental status   . Cymbalta [Duloxetine Hcl] Other (See Comments)    Reaction:  Sedative for pt   . Imipramine Other (See Comments)    Reaction:  Unknown   . Proton Pump Inhibitors Other (See Comments)    Reaction:  Unknown   . Venlafaxine Nausea And Vomiting and Other (See Comments)    Reaction:  Dizziness     VITALS:  Blood pressure 116/69, pulse (!) 104, temperature 98.4 F (36.9 C), temperature source Oral, resp. rate 18, height 5\' 4"  (1.626 m), weight 94.8 kg (209 lb), SpO2 96 %.  PHYSICAL EXAMINATION:   GENERAL:  83 y.o.-year-old patient lying in the bed with no acute distress. Obese., EYES: Pupils equal, round, reactive to light  and accommodation. No scleral icterus. Extraocular muscles intact.  HEENT: Head atraumatic, normocephalic. Oropharynx and nasopharynx clear.  NECK:  Supple, no jugular venous distention. No thyroid enlargement, no tenderness.  LUNGS: Decreased breath sounds bilaterally, bibasilar crepitations heard. No use of accessory muscles of respiration.  CARDIOVASCULAR: S1, S2 normal. No murmurs, rubs, or gallops.  ABDOMEN: Soft, nontender, nondistended. Bowel sounds present. No organomegaly or mass.  EXTREMITIES: Has pedal edema, no cyanosis, or clubbing.  NEUROLOGIC: Cranial nerves II through XII are intact. Muscle strength 3/5 in all extremities. Sensation intact. Gait not checked.  PSYCHIATRIC: The patient is alert,awake. SKIN: No obvious rash, lesion, or ulcer.   Physical Exam LABORATORY PANEL:   CBC No results for input(s): WBC, HGB, HCT, PLT in the last 168 hours. ------------------------------------------------------------------------------------------------------------------  Chemistries   Recent Labs Lab 06/16/17 0531  06/22/17 0607  NA 139  < > 138  K 4.0  < > 3.8  CL 106  < > 105  CO2 26  < > 24  GLUCOSE 112*  < > 120*  BUN 17  < > 26*  CREATININE 0.70  < > 0.86  CALCIUM 9.2  < > 9.3  MG 2.1  --   --   < > = values in this interval not displayed. ------------------------------------------------------------------------------------------------------------------  Cardiac Enzymes No results for input(s): TROPONINI in the last 168 hours. ------------------------------------------------------------------------------------------------------------------  RADIOLOGY:  No results found.  ASSESSMENT AND PLAN:   Principal Problem:  Acute combined systolic and diastolic CHF, NYHA class 3 (HCC) Active Problems:   Complete heart block (HCC)   S/P CABG x 2   Aortic stenosis - s/p AVR   Coronary artery disease involving coronary bypass graft of native heart with angina pectoris  (HCC)   S/P AVR (aortic valve replacement)   Ischemic cardiomyopathy   Goals of care, counseling/discussion   Hypotension  * Acute combined systolic and diastolic CHF, NYHA class 3 ([place]), LV EF: 20% -   25%   Drop in EF  hypotension: Continue to hold metoprolol, losartan,  Blood pressure better today, resume Lasix at lower dose, see how she does, continuing to monitor on telemetry.  Discontinued entresto  Due to hypotension.,conntinue losartan,     Appreciated cardio consult.    * CAD   ASA, atorvastatin, hold metoprolol because of hypotension.  * dilated cardiomyopathy  .  * complete heart block   Paced rhythm.  * nausea- chronic complain.   On Reglan, famotidine. added sucralfate    The patient has poor prognosis.  All the records are reviewed and case discussed with Care Management/Social Workerr. Management plans discussed with the patient and his niece and they are in agreement.  CODE STATUS: DNR. TOTAL TIME TAKING CARE OF THIS PATIENT: 38 minutes.   POSSIBLE D/C IN 2 DAYS, DEPENDING ON CLINICAL CONDITION.   Epifanio Lesches M.D on 06/22/2017   Between 7am to 6pm - Pager - [phone]  After 6pm go to [email or web address] - password [email or web address] [place] [facility]  Office  [phone]  CC: Primary care physician; [name], MD  Note: This dictation was prepared with Dragon dictation along with smaller phrase technology. Any transcriptional errors that result from this process are unintentional.

## 2017-06-22 NOTE — Progress Notes (Signed)
Initial Nutrition Assessment  DOCUMENTATION CODES:   Obesity unspecified  INTERVENTION:   Premier Protein BID, each supplement provides 160kcal and 30g protein.   NUTRITION DIAGNOSIS:   Increased nutrient needs related to  (CHF) as evidenced by increased estimated needs from protein.  GOAL:   Patient will meet greater than or equal to 90% of their needs  MONITOR:   PO intake, Supplement acceptance, Labs, Weight trends, I & O's  REASON FOR ASSESSMENT:   LOS    ASSESSMENT:   83 y.o. female  with past medical history of chronic diastolic heart failure, CAD s/p CABG in 2012 and 2016, severe aortic stenosis s/p AVR 2012, complete heart block s/p pacemaker, HTN, HLD, Type 2 DM, nausea, fibromyalgia, and spinal stenosis admitted on 06/12/2017 with nausea and shortness of breath. In ED, chest xray revealed bilateral pleural effusions and congestion. Elevated BNP. Echo reported EF 20-25%. Now with combined systolic and diastolic CHF. IV lasix 47m BID due to hypotension. Per cardiology, she is not a candidate for right and left cardiac catheterization. Receiving reglan, famotidine, and sucralfate for chronic nausea. Palliative medicine following   Met with pt in room today. Pt reports poor appetite and oral intake for 6 weeks pta. Pt reports that she has been having intermittent nausea and vomiting for several months. Per chart, pt has lost 32lbs(13%) in 3 months; this is severe. RD unsure if some weight loss r/t fluid changes. Pt is currently eating 100% of her meals in hospital. RD will order supplements to help pt meet estimated protein needs. Palliative care following. Pt will likely discharge to SNF.     Medications reviewed and include: acidophilus, aspirin, tums, Vit D, colace, lovenox, pepcid, lasix, reglan, KCl, carafate  Labs reviewed: BUN 26(H)  Nutrition-Focused physical exam completed. Findings are no fat depletion, no muscle depletion, and moderate edema.   Diet Order:   Diet Heart Room service appropriate? Yes; Fluid consistency: Thin  Skin:  Reviewed, no issues  Last BM:  7/18  Height:   Ht Readings from Last 1 Encounters:  06/12/17 5' 4"  (1.626 m)    Weight:   Wt Readings from Last 1 Encounters:  06/22/17 209 lb (94.8 kg)    Ideal Body Weight:  54.5 kg  BMI:  Body mass index is 35.87 kg/m.  Estimated Nutritional Needs:   Kcal:  1600-1900kcal/day   Protein:  95-104g/day   Fluid:  >1.6L/day   EDUCATION NEEDS:   No education needs identified at this time  CKoleen DistanceMS, RD, LMovillePager #[phone] Hours Pager: [phone]

## 2017-06-22 NOTE — Progress Notes (Signed)
BP improved this morning. Would continue to hold losartan and beta blocker given soft BP overall. May be able to resume low-dose Lasix at discharge. Overall, poor prognosis. Palliative Care to follow as outpatient.

## 2017-06-22 NOTE — Care Management (Signed)
Barrier- lose dose intravenous lasix restarted today. do not anticipate discharge over the weekend

## 2017-06-23 MED ORDER — METOPROLOL TARTRATE 25 MG PO TABS
25.0000 mg | ORAL_TABLET | Freq: Two times a day (BID) | ORAL | Status: DC
  Administered 2017-06-23 – 2017-06-24 (×3): 25 mg via ORAL
  Filled 2017-06-23 (×4): qty 1

## 2017-06-23 NOTE — Progress Notes (Signed)
[facility] at [place] NAME: Michele Schultz    MR#:  [id number]  DATE OF BIRTH:  [birth year]  SUBJECTIVE: Seen at bedside, patient denies shortness of breath. Blood pressure better.   CHIEF COMPLAINT:   Chief Complaint  Patient presents with  . Nausea  . Emesis   Came with SOB.   Have significant drop in EF%   Complain of nausea with her food for last 2 months which is not changed.   REVIEW OF SYSTEMS:   CONSTITUTIONAL: generalized weakness.  EYES: No blurred or double vision.  EARS, NOSE, AND THROAT: No tinnitus or ear pain.  RESPIRATORY: has cough and shortness of breath, No wheezing or hemoptysis.  CARDIOVASCULAR: no chest pain,  has orthopnea, edema.  GASTROINTESTINAL: has nausea,no vomiting, diarrhea or abdominal pain.  GENITOURINARY: No dysuria, hematuria.  ENDOCRINE: No polyuria, nocturia,  HEMATOLOGY: No anemia, easy bruising or bleeding SKIN: No rash or lesion. MUSCULOSKELETAL: No joint pain or arthritis.   NEUROLOGIC: No tingling, numbness, weakness.  PSYCHIATRY: No anxiety or depression.   ROS  DRUG ALLERGIES:   Allergies  Allergen Reactions  . Citalopram Other (See Comments)    Reaction:  Altered mental status   . Cymbalta [Duloxetine Hcl] Other (See Comments)    Reaction:  Sedative for pt   . Imipramine Other (See Comments)    Reaction:  Unknown   . Proton Pump Inhibitors Other (See Comments)    Reaction:  Unknown   . Venlafaxine Nausea And Vomiting and Other (See Comments)    Reaction:  Dizziness     VITALS:  Blood pressure 109/64, pulse (!) 108, temperature 98.4 F (36.9 C), temperature source Oral, resp. rate 16, height 5\' 4"  (1.626 m), weight 95.8 kg (211 lb 4.8 oz), SpO2 99 %.  PHYSICAL EXAMINATION:   GENERAL:  83 y.o.-year-old patient lying in the bed with no acute distress. Obese., EYES: Pupils equal, round, reactive to light and accommodation. No scleral icterus. Extraocular muscles intact.   HEENT: Head atraumatic, normocephalic. Oropharynx and nasopharynx clear.  NECK:  Supple, no jugular venous distention. No thyroid enlargement, no tenderness.  LUNGS: Clear to auscultation, no wheeze, no rales CARDIOVASCULAR: S1, S2 normal. No murmurs, rubs, or gallops.  ABDOMEN: Soft, nontender, nondistended. Bowel sounds present. No organomegaly or mass.  EXTREMITIES: Has pedal edema, no cyanosis, or clubbing.  NEUROLOGIC: Cranial nerves II through XII are intact. Muscle strength 3/5 in all extremities. Sensation intact. Gait not checked.  PSYCHIATRIC: The patient is alert,awake. SKIN: No obvious rash, lesion, or ulcer.   Physical Exam LABORATORY PANEL:   CBC No results for input(s): WBC, HGB, HCT, PLT in the last 168 hours. ------------------------------------------------------------------------------------------------------------------  Chemistries   Recent Labs Lab 06/22/17 0607  NA 138  K 3.8  CL 105  CO2 24  GLUCOSE 120*  BUN 26*  CREATININE 0.86  CALCIUM 9.3   ------------------------------------------------------------------------------------------------------------------  Cardiac Enzymes No results for input(s): TROPONINI in the last 168 hours. ------------------------------------------------------------------------------------------------------------------  RADIOLOGY:  No results found.  ASSESSMENT AND PLAN:   Principal Problem:   Acute combined systolic and diastolic CHF, NYHA class 3 (HCC) Active Problems:   Complete heart block (HCC)   S/P CABG x 2   Aortic stenosis - s/p AVR   Coronary artery disease involving coronary bypass graft of native heart with angina pectoris (HCC)   S/P AVR (aortic valve replacement)   Ischemic cardiomyopathy   Goals of care, counseling/discussion   Hypotension  * Acute combined systolic  and diastolic CHF, NYHA class 3 ([place]), LV EF: 20% -   25%   Drop in EF  hypotensio ; improving, continue Lasix, introduce metoprolol  today.    Appreciated cardio consult.    * CAD   ASA, atorvastatin, hold metoprolol because of hypotension.  * dilated cardiomyopathy  .  * complete heart block   Paced rhythm.  * nausea- chronic complain.   On Reglan, famotidine. added sucralfate    The patient has poor prognosis.  All the records are reviewed and case discussed with Care Management/Social Workerr. Management plans discussed with the patient and his niece and they are in agreement.  CODE STATUS: DNR. TOTAL TIME TAKING CARE OF THIS PATIENT: 38 minutes.   POSSIBLE D/C IN 2 DAYS, DEPENDING ON CLINICAL CONDITION.   Epifanio Lesches M.D on 06/23/2017   Between 7am to 6pm - Pager - [phone]  After 6pm go to [email or web address] - password [email or web address] [facility]  Office  [phone]  CC: Primary care physician; [name], MD  Note: This dictation was prepared with Dragon dictation along with smaller phrase technology. Any transcriptional errors that result from this process are unintentional.

## 2017-06-24 NOTE — Progress Notes (Signed)
[facility] at [place] NAME: Michele Schultz    MR#:  [id number]  DATE OF BIRTH:  04-14-1931  SUBJECTIVE: Seen at bedside. complains of shortness of breath. No chest pain.   CHIEF COMPLAINT:   Chief Complaint  Patient presents with  . Nausea  . Emesis   Came with SOB.   Have significant drop in EF%   Complain of nausea with her food for last 2 months which is not changed.   REVIEW OF SYSTEMS:   CONSTITUTIONAL: generalized weakness.  EYES: No blurred or double vision.  EARS, NOSE, AND THROAT: No tinnitus or ear pain.  RESPIRATORY: has cough and shortness of breath, No wheezing or hemoptysis.  CARDIOVASCULAR: no chest pain,  has orthopnea, edema.  GASTROINTESTINAL: has nausea,no vomiting, diarrhea or abdominal pain.  GENITOURINARY: No dysuria, hematuria.  ENDOCRINE: No polyuria, nocturia,  HEMATOLOGY: No anemia, easy bruising or bleeding SKIN: No rash or lesion. MUSCULOSKELETAL: No joint pain or arthritis.   NEUROLOGIC: No tingling, numbness, weakness.  PSYCHIATRY: No anxiety or depression.   ROS  DRUG ALLERGIES:   Allergies  Allergen Reactions  . Citalopram Other (See Comments)    Reaction:  Altered mental status   . Cymbalta [Duloxetine Hcl] Other (See Comments)    Reaction:  Sedative for pt   . Imipramine Other (See Comments)    Reaction:  Unknown   . Proton Pump Inhibitors Other (See Comments)    Reaction:  Unknown   . Venlafaxine Nausea And Vomiting and Other (See Comments)    Reaction:  Dizziness     VITALS:  Blood pressure 105/64, pulse (!) 103, temperature 98.3 F (36.8 C), temperature source Oral, resp. rate 16, height 5\' 4"  (1.626 m), weight 95.9 kg (211 lb 6.4 oz), SpO2 93 %.  PHYSICAL EXAMINATION:   GENERAL:  83 y.o.-year-old patient lying in the bed with no acute distress. Obese., EYES: Pupils equal, round, reactive to light and accommodation. No scleral icterus. Extraocular muscles intact.  HEENT: Head  atraumatic, normocephalic. Oropharynx and nasopharynx clear.  NECK:  Supple, no jugular venous distention. No thyroid enlargement, no tenderness.  LUNGS: Clear to auscultation, no wheeze, no rales CARDIOVASCULAR: S1, S2 normal. No murmurs, rubs, or gallops.  ABDOMEN: Soft, nontender, nondistended. Bowel sounds present. No organomegaly or mass.  EXTREMITIES: Has pedal edema, no cyanosis, or clubbing.  NEUROLOGIC: Cranial nerves II through XII are intact. Muscle strength 3/5 in all extremities. Sensation intact. Gait not checked.  PSYCHIATRIC: The patient is alert,awake. SKIN: No obvious rash, lesion, or ulcer.   Physical Exam LABORATORY PANEL:   CBC No results for input(s): WBC, HGB, HCT, PLT in the last 168 hours. ------------------------------------------------------------------------------------------------------------------  Chemistries   Recent Labs Lab 06/22/17 0607  NA 138  K 3.8  CL 105  CO2 24  GLUCOSE 120*  BUN 26*  CREATININE 0.86  CALCIUM 9.3   ------------------------------------------------------------------------------------------------------------------  Cardiac Enzymes No results for input(s): TROPONINI in the last 168 hours. ------------------------------------------------------------------------------------------------------------------  RADIOLOGY:  No results found.  ASSESSMENT AND PLAN:   Principal Problem:   Acute combined systolic and diastolic CHF, NYHA class 3 (HCC) Active Problems:   Complete heart block (HCC)   S/P CABG x 2   Aortic stenosis - s/p AVR   Coronary artery disease involving coronary bypass graft of native heart with angina pectoris (HCC)   S/P AVR (aortic valve replacement)   Ischemic cardiomyopathy   Goals of care, counseling/discussion   Hypotension  * Acute combined systolic  and diastolic CHF, NYHA class 3 ([place]), LV EF: 20% -   25%   Drop in EF Continue Lasix 20 mg by mouth twice a day, metoprolol 25 mg by mouth twice a  day, possible discharge tomorrow.  * CAD   ASA, atorvastatin, hold metoprolol because of hypotension.  * dilated cardiomyopathy  .  * complete heart block   Paced rhythm.  * nausea- chronic complain.   On Reglan, famotidine. added sucralfate   History of depression: Continue Ativan, Lamictal.  The patient has poor prognosis.   All the records are reviewed and case discussed with Care Management/Social Workerr. Management plans discussed with the patient and his niece and they are in agreement.  CODE STATUS: DNR. TOTAL TIME TAKING CARE OF THIS PATIENT: 38 minutes.   POSSIBLE D/C IN 2 DAYS, DEPENDING ON CLINICAL CONDITION.   Epifanio Lesches M.D on 06/24/2017   Between 7am to 6pm - Pager - [phone]  After 6pm go to [email or web address] - password [email or web address] [facility]  Office  [phone]  CC: Primary care physician; [name], MD  Note: This dictation was prepared with Dragon dictation along with smaller phrase technology. Any transcriptional errors that result from this process are unintentional.

## 2017-06-24 NOTE — Progress Notes (Signed)
No significant change this shift,vital signs within normal limits

## 2017-06-24 NOTE — Progress Notes (Signed)
Physical Therapy Treatment Patient Details Name: Michele Schultz MRN: [id number] DOB: 1931-03-09 Today's Date: 06/24/2017    History of Present Illness Michele Schultz is an 83yo black female who comes t [facility] on 7/10 c emesis and SOB, imaging revealing of bilat pleural effusion/congestion, admitted for CHF exacerbation. PMH: anemia, aortic stenosis, atrial tachycardia, PPM, DCHF, CAD s/p CBAGx2, HTN, HLD, B THA, B TKA, fibromyalgia, RA, and Vertigo. LVEF: 20-25%. At baseline, pt lives longterm SNF, [facility] for facility mobility, self propels 25-50% of time, requirs assist for ADL/IADL, and mod-max assist for bed mobility.     PT Comments    Pt agrees to exercises but declines out of bed this morning.  Participated in exercises as described below.  Pt requested bedpan after first set of exercises.  Assisted with mod/max a on bedpan and nursing tech notified.  Call bell in reach.   Follow Up Recommendations  SNF     Equipment Recommendations  None recommended by PT    Recommendations for Other Services       Precautions / Restrictions Precautions Precautions: Fall Restrictions Weight Bearing Restrictions: No    Mobility  Bed Mobility Overal bed mobility: Needs Assistance Bed Mobility: Rolling Rolling: Mod assist;Max assist         General bed mobility comments: for bedpan  Transfers                 General transfer comment: declined out of bed  Ambulation/Gait                 Stairs            Wheelchair Mobility    Modified Rankin (Stroke Patients Only)       Balance                                            Cognition Arousal/Alertness: Awake/alert Behavior During Therapy: WFL for tasks assessed/performed Overall Cognitive Status: Within Functional Limits for tasks assessed                                        Exercises Other Exercises Other Exercises: supine ankle pumps, heel slides, SLR,  ab/adduction x 10 AAROM    General Comments        Pertinent Vitals/Pain Pain Assessment: 0-10 Pain Score: 4  Pain Location: Left groin Pain Descriptors / Indicators: Sore Pain Intervention(s): Limited activity within patient's tolerance    Home Living                      Prior Function            PT Goals (current goals can now be found in the care plan section)      Frequency    Min 2X/week      PT Plan Current plan remains appropriate    Co-evaluation              AM-PAC PT "6 Clicks" Daily Activity  Outcome Measure  Difficulty turning over in bed (including adjusting bedclothes, sheets and blankets)?: Total Difficulty moving from lying on back to sitting on the side of the bed? : Total Difficulty sitting down on and standing up from a chair with arms (e.g., wheelchair, bedside commode, etc,.)?: Total Help needed  moving to and from a bed to chair (including a wheelchair)?: Total Help needed walking in hospital room?: Total Help needed climbing 3-5 steps with a railing? : Total 6 Click Score: 6    End of Session   Activity Tolerance: Patient tolerated treatment well Patient left: in bed;with call bell/phone within reach;with bed alarm set Nurse Communication: Other (comment)       Time: 8485-9276 PT Time Calculation (min) (ACUTE ONLY): 12 min  Charges:  $Therapeutic Exercise: 8-22 mins                    G Codes:       Chesley Noon, PTA 06/24/17, 9:08 AM

## 2017-06-25 MED ORDER — ATORVASTATIN CALCIUM 80 MG PO TABS
80.0000 mg | ORAL_TABLET | Freq: Every day | ORAL | 0 refills | Status: AC
Start: 2017-06-26 — End: ?

## 2017-06-25 MED ORDER — FUROSEMIDE 20 MG PO TABS: 20.0000 mg | ORAL_TABLET | Freq: Two times a day (BID) | ORAL | 0 refills | Status: AC

## 2017-06-25 MED ORDER — LORAZEPAM 0.5 MG PO TABS: 0.5000 mg | ORAL_TABLET | Freq: Every day | ORAL | 0 refills | Status: DC

## 2017-06-25 MED ORDER — PREMIER PROTEIN SHAKE: 11.0000 [oz_av] | Freq: Two times a day (BID) | ORAL | 0 refills | Status: DC

## 2017-06-25 MED ORDER — CARVEDILOL 3.125 MG PO TABS: 3.1250 mg | ORAL_TABLET | Freq: Two times a day (BID) | ORAL | 0 refills | Status: AC

## 2017-06-25 MED ORDER — SERTRALINE HCL 50 MG PO TABS: 150.0000 mg | ORAL_TABLET | Freq: Every day | ORAL | 0 refills | Status: AC

## 2017-06-25 NOTE — Clinical Social Work Note (Signed)
Patient to be d/c'ed today to [facility].  Patient and family agreeable to plans will transport via ems RN to call report [phone].  CSW contacted patient's niece Karin Lieu [phone].  Evette Cristal, MSW, [place]

## 2017-06-25 NOTE — Progress Notes (Signed)
Patient dc'd via [facility]. Belongings sent with patient. No distress noted at time of discharge.

## 2017-06-25 NOTE — Discharge Summary (Addendum)
Michele Schultz, is a 83 y.o. female  DOB 02-15-1931  MRN [id number].  Admission date:  06/12/2017  Admitting Physician  Saundra Shelling, MD  Discharge Date:  06/25/2017   Primary MD  [name], MD  Recommendations for primary care physician for things to follow:   Follow-up with PCP in 10 days Follow-up with [facility] cardiology in 2 weeks.   Admission Diagnosis  Acute on chronic congestive heart failure, unspecified heart failure type ([place]) [I50.9]   Discharge Diagnosis  Acute on chronic congestive heart failure, unspecified heart failure type ([place]) [I50.9]   Principal Problem:   Acute combined systolic and diastolic CHF, NYHA class 3 (HCC) Active Problems:   Complete heart block (HCC)   S/P CABG x 2   Aortic stenosis - s/p AVR   Coronary artery disease involving coronary bypass graft of native heart with angina pectoris (HCC)   S/P AVR (aortic valve replacement)   Ischemic cardiomyopathy   Goals of care, counseling/discussion   Hypotension      Past Medical History:  Diagnosis Date  . Anemia   . Aortic stenosis, severe    a. s/p Magna Ease pericardial tissue valve size 21 mm replacement in 10/2011 for severe AS 11/12; b. echo 07/2015: EF 55-60% mod concentric LVH, GR1DD, LA mildly dilated, PASP 45 mm Hg  . Atrial tachycardia, paroxysmal (HCC)    a. with rate related LBBB.  . Cardiac pacemaker -st Judes    11/12  . Chronic diastolic heart failure ([place])    a. echo 2014: EF 55-60%, no RWMA, GR1DD, PASP 47 mm Hg; b. echo 07/2015: EF 55-60% mod concentric LVH, GR1DD, LA mildly dilated, PASP 45 mm Hg  . Complete heart block [facility])    a. s/p St Jude PPM 10/2011 (Ser # [id number]).  . Coronary artery disease    a. s/p 2v CABG 11/12 (VG-LAD, VG-OM1); b. Lexiscan 08/2015: low risk, no ischemia, EF 55-65%.  .  Enthesopathy of hip region   . Esophageal reflux    followed by Dr.Seigal. stabilized with a combination of Nexium and Zantac  . Fibromyalgia   . HLD (hyperlipidemia)   . Hypertensive heart disease   . Knee joint replacement by other means   . Morbid obesity ([place])   . Neuralgia, neuritis, and radiculitis, unspecified   . Neuropathy   . Onychia and paronychia of toe   . Osteoarthrosis, unspecified whether generalized or localized, pelvic region and thigh    mainly in her back and knees  . PAF (paroxysmal atrial fibrillation) ([place])    a. brief episodes of AF previously noted on device interrogations.  Marland Kitchen RAD (reactive airway disease)    a. chronic SOB  . Spinal stenosis   . Stress incontinence, female    followed by Dr.Cope  . Type II or unspecified type diabetes mellitus without mention of complication, not stated as uncontrolled    a. pt. reports that she is borderline   . Wide-complex tachycardia ([place])    a. Noted 4/10 - brief episode in ED. Noted again 4/21 in clinic->felt most likely to be atrial tach.    Past Surgical History:  Procedure Laterality Date  . AORTIC VALVE REPLACEMENT  10/17/2011   Procedure: AORTIC VALVE REPLACEMENT (AVR);  Surgeon: Melrose Nakayama, MD;  Location: [place];  Service: Open Heart Surgery;  Laterality: N/A;  . CARDIAC CATHETERIZATION  08/2009   50% stenosis distal left main, 50% stenosis ostial left circumflex.   Marland Kitchen CARDIAC CATHETERIZATION  at [facility]  . CORONARY ARTERY BYPASS GRAFT  10/17/2011   Procedure: CORONARY ARTERY BYPASS GRAFTING (CABG);  Surgeon: Melrose Nakayama, MD;  Location: [place];  Service: Open Heart Surgery;  Laterality: N/A;  Times Two, using right leg greater saphenous vein harvested endoscopically  . ESOPHAGOGASTRODUODENOSCOPY (EGD) WITH PROPOFOL N/A 10/12/2016   Procedure: ESOPHAGOGASTRODUODENOSCOPY (EGD) WITH PROPOFOL;  Surgeon: Lucilla Lame, MD;  Location: [facility] ENDOSCOPY;  Service: Endoscopy;  Laterality: N/A;  . EYE  SURGERY     IOL/ after cataracts removed- bilateral   . NASAL SINUS SURGERY  2009  . PERMANENT PACEMAKER INSERTION N/A 10/20/2011   Procedure: PERMANENT PACEMAKER INSERTION;  Surgeon: Thompson Grayer, MD;  [facility] C[facility] LAB;  Service: Cardiovascular;  Laterality: N/A;  . REPLACEMENT TOTAL KNEE  11/2006   right knee  . TOTAL HIP ARTHROPLASTY  09/2010       History of present illness and  Hospital Course:     Kindly see H&P for history of present illness and admission details, please review complete Labs, Consult reports and Test reports for all details in brief  HPI  from the history and physical done on the day of admission  83 year old female patient with history of aortic stenosis, paroxysmal atrial tachycardia, cardiac pacemaker, NYHA class III heart failure comes in because of nausea, vomiting, shortness of breath. Patient noted to have acute on chronic systolic heart failure, patient had elevated BNP on admission. She was not taking Lasix as she should at home.  Hospital Course  #1 nausea, vomiting due to diabetic gastroparesis improved with Reglan, sucralfate, famotidine. She is tolerating the diet. #2 he acute combined systolic and diastolic heart failure, NYHA class III. Seen by health cardiology. Patient had history of complete heart block, cardiac bypass 2, history of aortic valve replacement for aortic stenosis. Patient ejection fraction 20-25%. Patient received IV Lasix, started on  entresto. Received IV diuretics, patient blood pressure dropped with IV Lasix, and Crestor. Patient initially was on his new pictures after 36 hours of washing out of ACE inhibitor started on entresto but patient blood pressure dropped with contrast on Lasix. We stopped entrestobecause of hypotension.  Patient now on Lasix 20 mg by mouth twice a day, charted on Coreg 3.125 mg twice a day, she is going to [facility] today. Blood pressure for her 90/60 is acceptable because of her advanced heart  failure and discussed with cardiology also about this.DR.Arida mentioned that her BP is going to be low due to her advanced heart failure.  #2 .history of depression: Continue Ativan, Lamictal. #3. Seen by palliative care also because of recurrent hospitalizations, advanced heart failure. Patient can  Resume palliative care services at [facility].  4.DMII;continue metformin.  Discharge Condition:    Follow UP  Follow-up Information    [facility] Follow up on 06/27/2017.   Specialty:  Cardiology Why:  at 10:00am  Contact information: [address] [place] [phone]            Discharge Instructions  and  Discharge Medications   Low sodium diet  Allergies as of 06/25/2017      Reactions   Citalopram Other (See Comments)   Reaction:  Altered mental status    Cymbalta [duloxetine Hcl] Other (See Comments)   Reaction:  Sedative for pt    Imipramine Other (See Comments)   Reaction:  Unknown    Proton Pump Inhibitors Other (See Comments)  Reaction:  Unknown    Venlafaxine Nausea And Vomiting, Other (See Comments)   Reaction:  Dizziness       Medication List    STOP taking these medications   diltiazem 180 MG 24 hr capsule Commonly known as:  CARDIZEM CD   HYDROcodone-acetaminophen 10-325 MG tablet Commonly known as:  NORCO   nitroGLYCERIN 0.4 MG SL tablet Commonly known as:  NITROSTAT   potassium chloride SA 20 MEQ tablet Commonly known as:  K-DUR,KLOR-CON     TAKE these medications   acetaminophen 650 MG CR tablet Commonly known as:  TYLENOL Take 650 mg by mouth 3 (three) times daily.   acidophilus Caps capsule Take 1 capsule by mouth 2 (two) times daily.   aluminum-magnesium hydroxide-simethicone 517-001-74 MG/5ML Susp Commonly known as:  MAALOX Take 30 mLs by mouth every 4 (four) hours as needed.   aspirin EC 81 MG tablet Take 81 mg by mouth daily.    atorvastatin 80 MG tablet Commonly known as:  LIPITOR Take 1 tablet (80 mg total) by mouth daily.   carvedilol 3.125 MG tablet Commonly known as:  COREG Take 1 tablet (3.125 mg total) by mouth 2 (two) times daily.   famotidine 20 MG tablet Commonly known as:  PEPCID Take 1 tablet (20 mg total) by mouth 2 (two) times daily.   furosemide 20 MG tablet Commonly known as:  LASIX Take 1 tablet (20 mg total) by mouth 2 (two) times daily. What changed:  when to take this   gabapentin 100 MG capsule Commonly known as:  NEURONTIN Take 100 mg by mouth at bedtime.   lamoTRIgine 25 MG tablet Commonly known as:  LAMICTAL Take 50 mg by mouth 2 (two) times daily.   LORazepam 0.5 MG tablet Commonly known as:  ATIVAN Take 1 tablet (0.5 mg total) by mouth at bedtime. What changed:  when to take this  reasons to take this   magnesium hydroxide 400 MG/5ML suspension Commonly known as:  MILK OF MAGNESIA Take 10 mLs by mouth daily.   magnesium oxide 400 MG tablet Commonly known as:  MAG-OX Take 1 tablet (400 mg total) by mouth daily.   metFORMIN 500 MG tablet Commonly known as:  GLUCOPHAGE Take 500 mg by mouth 2 (two) times daily with a meal.   metoCLOPramide 5 MG tablet Commonly known as:  REGLAN Take 1 tablet (5 mg total) by mouth 4 (four) times daily -  before meals and at bedtime. What changed:  when to take this   ondansetron 4 MG tablet Commonly known as:  ZOFRAN Take 4 mg by mouth every 6 (six) hours as needed for nausea or vomiting.   polyethylene glycol powder powder Commonly known as:  GLYCOLAX/MIRALAX Take 255 g by mouth daily. Mix entire contents of miralax (255g) with 64 oz of gatorade in a pitcher.   Drink 8oz of mixture every 30 minutes until you have multiple loose bowel movements.   Potassium Chloride ER 20 MEQ Tbcr Take 20 mEq by mouth 2 (two) times daily.   promethazine 12.5 MG tablet Commonly known as:  PHENERGAN Take 1 tablet (12.5 mg total) by mouth  every 6 (six) hours as needed for nausea. What changed:  when to take this   protein supplement shake Liqd Commonly known as:  PREMIER PROTEIN Take 325 mLs (11 oz total) by mouth 2 (two) times daily between meals.   QUEtiapine 25 MG tablet Commonly known as:  SEROQUEL Take 25 mg by mouth 2 (two) times  daily.   sertraline 50 MG tablet Commonly known as:  ZOLOFT Take 3 tablets (150 mg total) by mouth at bedtime.         Diet and Activity recommendation: See Discharge Instructions above   Consults obtained - cardio,palliative care   Major procedures and Radiology Reports - PLEASE review detailed and final reports for all details, in brief -     Dg Chest 2 View  Result Date: 06/12/2017 CLINICAL DATA:  83 year old female with cough. EXAM: CHEST  2 VIEW COMPARISON:  Chest radiograph dated 11/19/2016 FINDINGS: There is shallow inspiration with bibasilar atelectatic changes. Small bilateral pleural effusions may be present. Pneumonia is not excluded. There is no pneumothorax. There is mild cardiomegaly. Median sternotomy wires and mechanical cardiac bowel noted. Left pectoral dual lead pacemaker device. No acute osseous pathology. IMPRESSION: 1. Shallow inspiration with bibasilar atelectasis versus infiltrate. Trace bilateral pleural effusions may be present. 2. Mild cardiomegaly.  No vascular congestion or edema. Electronically Signed   By: Anner Crete M.D.   On: 06/12/2017 22:49    Micro Results    No results found for this or any previous visit (from the past 240 hour(s)).     Today   Subjective:   Michele Schultz today has no sob.stable for discharge/  Objective:   Blood pressure (!) 95/59, pulse (!) 109, temperature 98.4 F (36.9 C), temperature source Oral, resp. rate 16, height 5\' 4"  (1.626 m), weight 95.9 kg (211 lb 6.4 oz), SpO2 99 %.   Intake/Output Summary (Last 24 hours) at 06/25/17 1435 Last data filed at 06/25/17 1021  Gross per 24 hour  Intake               960 ml  Output                0 ml  Net              960 ml    Exam Awake Alert, Oriented x 3, No new F.N deficits, Normal affect [place].AT,PERRAL Supple Neck,No JVD, No cervical lymphadenopathy appriciated.  Symmetrical Chest wall movement, Good air movement bilaterally, CTAB RRR,No Gallops,Rubs or new Murmurs, No Parasternal Heave +ve B.Sounds, Abd Soft, Non tender, No organomegaly appriciated, No rebound -guarding or rigidity. No Cyanosis, Clubbing or edema, No new Rash or bruise  Data Review   CBC w Diff: Lab Results  Component Value Date   WBC 6.0 06/13/2017   HGB 10.7 (L) 06/13/2017   HGB 11.3 08/25/2015   HCT 32.7 (L) 06/13/2017   HCT 36.4 08/25/2015   PLT 292 06/13/2017   PLT 304 08/25/2015   LYMPHOPCT 22 03/08/2017   LYMPHOPCT 18.0 10/16/2014   MONOPCT 11 03/08/2017   MONOPCT 7 10/21/2014   MONOPCT 8.7 10/16/2014   EOSPCT 2 03/08/2017   EOSPCT 1.0 10/16/2014   BASOPCT 1 03/08/2017   BASOPCT 0.6 10/16/2014    CMP: Lab Results  Component Value Date   NA 138 06/22/2017   NA 142 02/12/2017   NA 140 12/04/2014   K 3.8 06/22/2017   K 3.3 (L) 12/04/2014   CL 105 06/22/2017   CL 104 12/04/2014   CO2 24 06/22/2017   CO2 29 12/04/2014   BUN 26 (H) 06/22/2017   BUN 8 02/12/2017   BUN 8 12/04/2014   CREATININE 0.86 06/22/2017   CREATININE 0.82 12/04/2014   GLU 130 02/12/2017   PROT 7.6 06/12/2017   PROT 7.9 12/04/2014   ALBUMIN 4.0 06/12/2017   ALBUMIN 3.5 12/04/2014  BILITOT 0.8 06/12/2017   BILITOT 0.8 12/04/2014   ALKPHOS 74 06/12/2017   ALKPHOS 92 12/04/2014   AST 26 06/12/2017   AST 26 12/04/2014   ALT 17 06/12/2017   ALT 18 12/04/2014  .   Total Time in preparing paper work, data evaluation and todays exam - 33 minutes  Michele Schultz M.D on 06/25/2017 at 2:35 PM    Note: This dictation was prepared with Dragon dictation along with smaller phrase technology. Any transcriptional errors that result from this process are  unintentional.

## 2017-06-25 NOTE — Care Management Important Message (Signed)
Important Message  Patient Details  Name: CHRISHAWN KRING MRN: [id number] Date of Birth: [birth year]   Medicare Important Message Given:  Yes    Katrina Stack, RN 06/25/2017, 1:16 PM

## 2017-06-27 ENCOUNTER — Ambulatory Visit: Payer: Medicare Other | Admitting: Family

## 2017-06-27 ENCOUNTER — Telehealth: Payer: Self-pay | Admitting: Family

## 2017-06-27 NOTE — Telephone Encounter (Signed)
Patient missed her initial appointment at the [facility] on 06/27/17. Will attempt to reschedule.

## 2017-08-15 ENCOUNTER — Encounter: Payer: Self-pay | Admitting: Emergency Medicine

## 2017-08-15 ENCOUNTER — Inpatient Hospital Stay
Admission: EM | Admit: 2017-08-15 | Discharge: 2017-08-17 | DRG: 291 | Disposition: A | Discharge status: Skilled Nursing Facility | Attending: Internal Medicine | Admitting: Internal Medicine

## 2017-08-15 ENCOUNTER — Emergency Department

## 2017-08-15 DIAGNOSIS — I509 Heart failure, unspecified: Secondary | ICD-10-CM

## 2017-08-15 DIAGNOSIS — R0902 Hypoxemia: Secondary | ICD-10-CM | POA: Diagnosis present

## 2017-08-15 DIAGNOSIS — Z951 Presence of aortocoronary bypass graft: Secondary | ICD-10-CM

## 2017-08-15 DIAGNOSIS — Z66 Do not resuscitate: Secondary | ICD-10-CM | POA: Diagnosis present

## 2017-08-15 DIAGNOSIS — I2510 Atherosclerotic heart disease of native coronary artery without angina pectoris: Secondary | ICD-10-CM | POA: Diagnosis present

## 2017-08-15 DIAGNOSIS — I110 Hypertensive heart disease with heart failure: Principal | ICD-10-CM | POA: Diagnosis present

## 2017-08-15 DIAGNOSIS — Z8249 Family history of ischemic heart disease and other diseases of the circulatory system: Secondary | ICD-10-CM

## 2017-08-15 DIAGNOSIS — I480 Paroxysmal atrial fibrillation: Secondary | ICD-10-CM | POA: Diagnosis present

## 2017-08-15 DIAGNOSIS — I5031 Acute diastolic (congestive) heart failure: Secondary | ICD-10-CM | POA: Diagnosis present

## 2017-08-15 DIAGNOSIS — Z79899 Other long term (current) drug therapy: Secondary | ICD-10-CM

## 2017-08-15 DIAGNOSIS — K219 Gastro-esophageal reflux disease without esophagitis: Secondary | ICD-10-CM | POA: Diagnosis present

## 2017-08-15 DIAGNOSIS — N393 Stress incontinence (female) (male): Secondary | ICD-10-CM | POA: Diagnosis present

## 2017-08-15 DIAGNOSIS — I5033 Acute on chronic diastolic (congestive) heart failure: Secondary | ICD-10-CM | POA: Diagnosis present

## 2017-08-15 DIAGNOSIS — I495 Sick sinus syndrome: Secondary | ICD-10-CM | POA: Diagnosis present

## 2017-08-15 DIAGNOSIS — Z888 Allergy status to other drugs, medicaments and biological substances status: Secondary | ICD-10-CM

## 2017-08-15 DIAGNOSIS — Z96649 Presence of unspecified artificial hip joint: Secondary | ICD-10-CM | POA: Diagnosis present

## 2017-08-15 DIAGNOSIS — E785 Hyperlipidemia, unspecified: Secondary | ICD-10-CM | POA: Diagnosis present

## 2017-08-15 DIAGNOSIS — F329 Major depressive disorder, single episode, unspecified: Secondary | ICD-10-CM | POA: Diagnosis present

## 2017-08-15 DIAGNOSIS — Z6835 Body mass index (BMI) 35.0-35.9, adult: Secondary | ICD-10-CM

## 2017-08-15 DIAGNOSIS — E43 Unspecified severe protein-calorie malnutrition: Secondary | ICD-10-CM

## 2017-08-15 DIAGNOSIS — Z7982 Long term (current) use of aspirin: Secondary | ICD-10-CM

## 2017-08-15 DIAGNOSIS — Z96651 Presence of right artificial knee joint: Secondary | ICD-10-CM | POA: Diagnosis present

## 2017-08-15 DIAGNOSIS — J45909 Unspecified asthma, uncomplicated: Secondary | ICD-10-CM | POA: Diagnosis present

## 2017-08-15 DIAGNOSIS — Z952 Presence of prosthetic heart valve: Secondary | ICD-10-CM

## 2017-08-15 DIAGNOSIS — E119 Type 2 diabetes mellitus without complications: Secondary | ICD-10-CM | POA: Diagnosis present

## 2017-08-15 DIAGNOSIS — I350 Nonrheumatic aortic (valve) stenosis: Secondary | ICD-10-CM | POA: Diagnosis present

## 2017-08-15 DIAGNOSIS — Z7984 Long term (current) use of oral hypoglycemic drugs: Secondary | ICD-10-CM

## 2017-08-15 LAB — COMPREHENSIVE METABOLIC PANEL
ALT: 13 U/L — AB (ref 14–54)
AST: 23 U/L (ref 15–41)
Albumin: 3.7 g/dL (ref 3.5–5.0)
Alkaline Phosphatase: 88 U/L (ref 38–126)
Anion gap: 10 (ref 5–15)
BUN: 17 mg/dL (ref 6–20)
CHLORIDE: 107 mmol/L (ref 101–111)
CO2: 24 mmol/L (ref 22–32)
CREATININE: 0.72 mg/dL (ref 0.44–1.00)
Calcium: 9.5 mg/dL (ref 8.9–10.3)
Glucose, Bld: 119 mg/dL — ABNORMAL HIGH (ref 65–99)
POTASSIUM: 4 mmol/L (ref 3.5–5.1)
SODIUM: 141 mmol/L (ref 135–145)
TOTAL PROTEIN: 7.4 g/dL (ref 6.5–8.1)
Total Bilirubin: 0.6 mg/dL (ref 0.3–1.2)

## 2017-08-15 LAB — CBC WITH DIFFERENTIAL/PLATELET
BASOS PCT: 1 %
Basophils Absolute: 0 10*3/uL (ref 0–0.1)
EOS ABS: 0.1 10*3/uL (ref 0–0.7)
EOS PCT: 2 %
HCT: 33.2 % — ABNORMAL LOW (ref 35.0–47.0)
HEMOGLOBIN: 11.1 g/dL — AB (ref 12.0–16.0)
Lymphocytes Relative: 35 %
Lymphs Abs: 1.7 10*3/uL (ref 1.0–3.6)
MCH: 27.6 pg (ref 26.0–34.0)
MCHC: 33.4 g/dL (ref 32.0–36.0)
MCV: 82.8 fL (ref 80.0–100.0)
Monocytes Absolute: 0.2 10*3/uL (ref 0.2–0.9)
Monocytes Relative: 3 %
NEUTROS PCT: 59 %
Neutro Abs: 2.7 10*3/uL (ref 1.4–6.5)
PLATELETS: 224 10*3/uL (ref 150–440)
RBC: 4.01 MIL/uL (ref 3.80–5.20)
RDW: 18.9 % — ABNORMAL HIGH (ref 11.5–14.5)
WBC: 4.7 10*3/uL (ref 3.6–11.0)

## 2017-08-15 LAB — BRAIN NATRIURETIC PEPTIDE: B NATRIURETIC PEPTIDE 5: 617 pg/mL — AB (ref 0.0–100.0)

## 2017-08-15 LAB — LIPASE, BLOOD: Lipase: 50 U/L (ref 11–51)

## 2017-08-15 LAB — TROPONIN I: Troponin I: <0.03 ng/mL (ref <0.03)

## 2017-08-15 MED ORDER — FUROSEMIDE 10 MG/ML IJ SOLN
40.0000 mg | Freq: Once | INTRAMUSCULAR | Status: AC
  Administered 2017-08-15: 40 mg via INTRAVENOUS
  Filled 2017-08-15: qty 4

## 2017-08-15 NOTE — ED Triage Notes (Signed)
Patient comes in EMS from [facility] with shortness of breath. Patient states she had some yesterday and its worse today. Patient states she has had a cough with thick sputum and a fever. Per EMS patient was 70% on room air at facility. She is 100% on 4L. Patients lungs are clear. Hx of CHF. Denies any swelling. Voices some chest tightness.

## 2017-08-15 NOTE — ED Provider Notes (Signed)
[facility] Emergency Department Provider Note  ____________________________________________   I have reviewed the triage vital signs and the nursing notes.   HISTORY  Chief Complaint Shortness of Breath    HPI Michele Schultz is a 83 y.o. female with the significant history including CHF with a very low EF, she states that she has been having increased shortness of breath over the last day or 2. Occasional cough. Subjective fever but was not documented. She states thatshe was very short of breath and finally called 911. Is worse when she lies flat, denies chest pain but feels her lungs are tight. Chronic leg swelling. Feels better since 02     Past Medical History:  Diagnosis Date  . Anemia   . Aortic stenosis, severe    a. s/p Magna Ease pericardial tissue valve size 21 mm replacement in 10/2011 for severe AS 11/12; b. echo 07/2015: EF 55-60% mod concentric LVH, GR1DD, LA mildly dilated, PASP 45 mm Hg  . Atrial tachycardia, paroxysmal (HCC)    a. with rate related LBBB.  . Cardiac pacemaker -st Judes    11/12  . Chronic diastolic heart failure ([place])    a. echo 2014: EF 55-60%, no RWMA, GR1DD, PASP 47 mm Hg; b. echo 07/2015: EF 55-60% mod concentric LVH, GR1DD, LA mildly dilated, PASP 45 mm Hg  . Complete heart block [facility])    a. s/p St Jude PPM 10/2011 (Ser # [id number]).  . Coronary artery disease    a. s/p 2v CABG 11/12 (VG-LAD, VG-OM1); b. Lexiscan 08/2015: low risk, no ischemia, EF 55-65%.  . Enthesopathy of hip region   . Esophageal reflux    followed by Dr.Seigal. stabilized with a combination of Nexium and Zantac  . Fibromyalgia   . HLD (hyperlipidemia)   . Hypertensive heart disease   . Knee joint replacement by other means   . Morbid obesity ([place])   . Neuralgia, neuritis, and radiculitis, unspecified   . Neuropathy   . Onychia and paronychia of toe   . Osteoarthrosis, unspecified whether generalized or localized, pelvic region and thigh     mainly in her back and knees  . PAF (paroxysmal atrial fibrillation) ([place])    a. brief episodes of AF previously noted on device interrogations.  Marland Kitchen RAD (reactive airway disease)    a. chronic SOB  . Spinal stenosis   . Stress incontinence, female    followed by Dr.Cope  . Type II or unspecified type diabetes mellitus without mention of complication, not stated as uncontrolled    a. pt. reports that she is borderline   . Wide-complex tachycardia ([place])    a. Noted 4/10 - brief episode in ED. Noted again 4/21 in clinic->felt most likely to be atrial tach.    Patient Active Problem List   Diagnosis Date Noted  . Hypotension   . Goals of care, counseling/discussion   . Ischemic cardiomyopathy 06/16/2017  . Acute combined systolic and diastolic CHF, NYHA class 3 ([place]) 06/12/2017  . Nausea vomiting and diarrhea 03/05/2017  . Adjustment disorder with depressed mood 11/02/2016  . Nausea & vomiting 11/01/2016  . Nausea and vomiting   . Atrial fibrillation with RVR ([place]) 10/11/2016  . Hypokalemia 10/11/2016  . DNR (do not resuscitate) 09/19/2016  . Palliative care by specialist 09/19/2016  . Muscle weakness (generalized)   . Atrial tachycardia ([place])   . Diarrhea 09/17/2016  . Dehydration 09/17/2016  . Metabolic acidosis 40/34/7425  . Headache due to trauma 07/03/2016  .  Wide-complex tachycardia ([place])   . Pain in the chest   . Emesis   . Weakness of both legs   . Coronary artery disease involving coronary bypass graft of native heart with angina pectoris ([place])   . S/P AVR (aortic valve replacement)   . Falls frequently   . HLD (hyperlipidemia)   . Fibromyalgia   . RAD (reactive airway disease)   . Morbid obesity ([place])   . Aortic stenosis - s/p AVR   . Chronic pain 06/17/2014  . Pain in the muscles 06/19/2013  . Chest pain 06/19/2013  . Wheezing 06/19/2013  . Weakness generalized 06/03/2013  . Dyspnea 01/27/2013  . Nausea 03/05/2012  . Constipation 03/05/2012  . Atrial  fibrillation-non sustained 02/09/2012  . Cardiac pacemaker -st Judes   . Chronic diastolic heart failure ([place])   . Coronary artery disease   . Long term (current) use of anticoagulants 01/03/2012  . S/P CABG x 2 11/14/2011  . S/P aortic valve replacement with porcine valve 11/14/2011  . Back pain 11/14/2011  . Complete heart block ([place]) 10/20/2011  . Hypertensive heart disease with CHF (congestive heart failure) ([place]) 09/15/2011  . Spinal stenosis 03/22/2011  . Fatigue 03/22/2011  . Hyperlipidemia 03/22/2011    Past Surgical History:  Procedure Laterality Date  . AORTIC VALVE REPLACEMENT  10/17/2011   Procedure: AORTIC VALVE REPLACEMENT (AVR);  Surgeon: Melrose Nakayama, MD;  Location: [place];  Service: Open Heart Surgery;  Laterality: N/A;  . CARDIAC CATHETERIZATION  08/2009   50% stenosis distal left main, 50% stenosis ostial left circumflex.   Marland Kitchen CARDIAC CATHETERIZATION     at [facility]  . CORONARY ARTERY BYPASS GRAFT  10/17/2011   Procedure: CORONARY ARTERY BYPASS GRAFTING (CABG);  Surgeon: Melrose Nakayama, MD;  Location: [place];  Service: Open Heart Surgery;  Laterality: N/A;  Times Two, using right leg greater saphenous vein harvested endoscopically  . ESOPHAGOGASTRODUODENOSCOPY (EGD) WITH PROPOFOL N/A 10/12/2016   Procedure: ESOPHAGOGASTRODUODENOSCOPY (EGD) WITH PROPOFOL;  Surgeon: Lucilla Lame, MD;  Location: [facility] ENDOSCOPY;  Service: Endoscopy;  Laterality: N/A;  . EYE SURGERY     IOL/ after cataracts removed- bilateral   . NASAL SINUS SURGERY  2009  . PERMANENT PACEMAKER INSERTION N/A 10/20/2011   Procedure: PERMANENT PACEMAKER INSERTION;  Surgeon: Thompson Grayer, MD;  Location: [facility] CATH LAB;  Service: Cardiovascular;  Laterality: N/A;  . REPLACEMENT TOTAL KNEE  11/2006   right knee  . TOTAL HIP ARTHROPLASTY  09/2010    Prior to Admission medications   Medication Sig Start Date End Date Taking? Authorizing Provider  acetaminophen (TYLENOL) 650 MG CR tablet Take 650 mg by  mouth 3 (three) times daily.   Yes [provider]  acidophilus (RISAQUAD) CAPS capsule Take 1 capsule by mouth 2 (two) times daily. 06/26/16  Yes Bettey Costa, MD  aluminum-magnesium hydroxide-simethicone (MAALOX) 188-416-60 MG/5ML SUSP Take 30 mLs by mouth every 4 (four) hours as needed.   Yes [provider]  aspirin EC 81 MG tablet Take 81 mg by mouth daily.    Yes [provider]  atorvastatin (LIPITOR) 80 MG tablet Take 1 tablet (80 mg total) by mouth daily. 06/26/17  Yes Epifanio Lesches, MD  carvedilol (COREG) 3.125 MG tablet Take 1 tablet (3.125 mg total) by mouth 2 (two) times daily. 06/25/17 06/25/18 Yes Epifanio Lesches, MD  famotidine (PEPCID) 20 MG tablet Take 1 tablet (20 mg total) by mouth 2 (two) times daily. 03/09/17  Yes Loletha Grayer, MD  furosemide (  LASIX) 20 MG tablet Take 1 tablet (20 mg total) by mouth 2 (two) times daily. 06/25/17  Yes Epifanio Lesches, MD  gabapentin (NEURONTIN) 100 MG capsule Take 200 mg by mouth 2 (two) times daily.    Yes [provider]  lamoTRIgine (LAMICTAL) 100 MG tablet Take 100 mg by mouth at bedtime.   Yes [provider]  lamoTRIgine (LAMICTAL) 25 MG tablet Take 50 mg by mouth every morning.   Yes [provider]  LORazepam (ATIVAN) 0.5 MG tablet Take 1 tablet (0.5 mg total) by mouth at bedtime. 06/25/17  Yes Epifanio Lesches, MD  magnesium hydroxide (MILK OF MAGNESIA) 400 MG/5ML suspension Take 10 mLs by mouth daily.    Yes [provider]  magnesium oxide (MAG-OX) 400 MG tablet Take 1 tablet (400 mg total) by mouth daily. 03/09/17  Yes Wieting, Richard, MD  metFORMIN (GLUCOPHAGE) 500 MG tablet Take 500 mg by mouth 2 (two) times daily with a meal.   Yes [provider]  metoCLOPramide (REGLAN) 5 MG tablet Take 1 tablet (5 mg total) by mouth 4 (four) times daily -  before meals and at bedtime. Patient taking differently: Take 5 mg by mouth daily.  03/09/17  Yes Wieting,  Richard, MD  ondansetron (ZOFRAN) 4 MG tablet Take 4 mg by mouth every 6 (six) hours as needed for nausea or vomiting.    Yes [provider]  polyethylene glycol powder (GLYCOLAX/MIRALAX) powder Take 255 g by mouth daily. Mix entire contents of miralax (255g) with 64 oz of gatorade in a pitcher.   Drink 8oz of mixture every 30 minutes until you have multiple loose bowel movements. 03/16/17  Yes Harvest Dark, MD  Potassium Chloride ER 20 MEQ TBCR Take 20 mEq by mouth 2 (two) times daily. 03/09/17  Yes Wieting, Richard, MD  promethazine (PHENERGAN) 12.5 MG tablet Take 1 tablet (12.5 mg total) by mouth every 6 (six) hours as needed for nausea. Patient taking differently: Take 12.5 mg by mouth every 4 (four) hours as needed for nausea.  03/09/17  Yes Wieting, Richard, MD  QUEtiapine (SEROQUEL) 25 MG tablet Take 25 mg by mouth 2 (two) times daily.   Yes [provider]  sertraline (ZOLOFT) 50 MG tablet Take 3 tablets (150 mg total) by mouth at bedtime. 06/25/17  Yes Epifanio Lesches, MD  protein supplement shake (PREMIER PROTEIN) LIQD Take 325 mLs (11 oz total) by mouth 2 (two) times daily between meals. 06/25/17   Epifanio Lesches, MD    Allergies Citalopram; Cymbalta [duloxetine hcl]; Imipramine; Proton pump inhibitors; and Venlafaxine  Family History  Problem Relation Age of Onset  . Heart disease Sister   . Heart disease Brother   . Heart disease Brother   . Heart disease Brother   . Diabetes Other   . Anesthesia problems Neg Hx   . Hypotension Neg Hx   . Malignant hyperthermia Neg Hx   . Pseudochol deficiency Neg Hx     Social History Social History  Substance Use Topics  . Smoking status: Never Smoker  . Smokeless tobacco: Never Used  . Alcohol use No    Review of Systems Constitutional: No fever/chills Eyes: No visual changes. ENT: No sore throat. No stiff neck no neck pain Cardiovascular: Denies chest pain. Respiratory: Denies shortness of  breath. Gastrointestinal:   no vomiting.  No diarrhea.  No constipation. Genitourinary: Negative for dysuria. Musculoskeletal: Negative lower extremity swelling Skin: Negative for rash. Neurological: Negative for severe headaches, focal weakness or numbness.  ____________________________________________   PHYSICAL EXAM:  VITAL SIGNS: ED Triage Vitals  Enc Vitals Group     BP 08/15/17 2046 120/79     Pulse Rate 08/15/17 2046 (!) 109     Resp 08/15/17 2046 (!) 24     Temp 08/15/17 2046 98 F (36.7 C)     Temp Source 08/15/17 2046 Oral     SpO2 08/15/17 2046 100 %     Weight 08/15/17 2052 216 lb (98 kg)     Height 08/15/17 2052 5\' 4"  (1.626 m)     Head Circumference --      Peak Flow --      Pain Score 08/15/17 2045 5     Pain Loc --      Pain Edu? --      Excl. in [place]? --     Constitutional: Alert and oriented. Well appearing and in no acute distress. Eyes: Conjunctivae are normal Head: Atraumatic HEENT: No congestion/rhinnorhea. Mucous membranes are moist.  Oropharynx non-erythematous Neck:   Nontender with no meningismus, no masses, no stridor Cardiovascular: Normal rate, regular rhythm. Grossly normal heart sounds.  Good peripheral circulation. Respiratory: Normal respiratory effort.  No retractions. Lungs diminished in the bases. Abdominal: Soft and nontender. No distention. No guarding no rebound Back:  There is no focal tenderness or step off.  there is no midline tenderness there are no lesions noted. there is no CVA tenderness Musculoskeletal: No lower extremity tenderness, no upper extremity tenderness. No joint effusions, no DVT signs strong distal pulses no edema Neurologic:  Normal speech and language. No gross focal neurologic deficits are appreciated.  Skin:  Skin is warm, dry and intact. No rash noted. Psychiatric: Mood and affect are normal. Speech and behavior are normal.  ____________________________________________   LABS (all labs ordered are  listed, but only abnormal results are displayed)  Labs Reviewed  CBC WITH DIFFERENTIAL/PLATELET - Abnormal; Notable for the following:       Result Value   Hemoglobin 11.1 (*)    HCT 33.2 (*)    RDW 18.9 (*)    All other components within normal limits  BRAIN NATRIURETIC PEPTIDE - Abnormal; Notable for the following:    B Natriuretic Peptide 617.0 (*)    All other components within normal limits  COMPREHENSIVE METABOLIC PANEL - Abnormal; Notable for the following:    Glucose, Bld 119 (*)    ALT 13 (*)    All other components within normal limits  TROPONIN I  LIPASE, BLOOD   ____________________________________________  EKG  I personally interpreted any EKGs ordered by me or triage ventricular paced rhythm rate 114,  ____________________________________________  RADIOLOGY  I reviewed any imaging ordered by me or triage that were performed during my shift and, if possible, patient and/or family made aware of any abnormal findings. ____________________________________________   PROCEDURES  Procedure(s) performed: None  Procedures  Critical Care performed: None  ____________________________________________   INITIAL IMPRESSION / ASSESSMENT AND PLAN / ED COURSE  Pertinent labs & imaging results that were available during my care of the patient were reviewed by me and considered in my medical decision making (see chart for details).  Pt in nad. however was hypoxic and she states she is not normally on oxygen. She is doing better on oxygen here. We'll give her Lasix as I suspect most likely this is CHF. Chest x-ray and blood work is reassuring BNP is elevated as well, no evidence of pneumonia, white count not elevated, no infiltrate noted. However  given hypoxia we will admit the patient to the hospital for further observation.    ____________________________________________   FINAL CLINICAL IMPRESSION(S) / ED DIAGNOSES  Final diagnoses:  None      This chart  was dictated using voice recognition software.  Despite best efforts to proofread,  errors can occur which can change meaning.      Schuyler Amor, MD 08/15/17 2232

## 2017-08-15 NOTE — ED Notes (Signed)
Patient transported to X-ray 

## 2017-08-15 NOTE — H&P (Addendum)
History and Physical   [facility] - [place] @ [facility] Admission History and Physical [employer], D.O.    Patient Name: Michele Schultz MR#: [id number] Date of Birth: 08/09/1931 Date of Admission: 08/15/2017  Referring MD/NP/PA: Dr. Burlene Arnt Primary Care Physician: [name], MD  Chief Complaint:  Chief Complaint  Patient presents with  . Shortness of Breath    HPI: Michele Schultz is a 83 y.o. female with a known history of severe aortic stenosis status post valve replacement, paroxysmal atrial fibrillation, pacemaker, chronic diastolic heart failure, coronary artery disease status post CABG, GERD, hypertension, hyperlipidemia, reactive airway disease, incontinence, Type 2 diabetes presents to the emergency department for evaluation of shortness of breath.  Patient was in a usual state of health until 2 days when she describes progressively worsening shortness of breath, dyspnea on exertion, nonproductive cough and orthopnea. She also reports mild worsening of her chronic lower extremity edema.   Of note patient was hospitalized in July for acute exacerbation of chronic congestive heart failure which is diastolic in nature..  Patient denies fevers/chills, weakness, dizziness, chest pain, N/V/C/D, abdominal pain, dysuria/frequency, changes in mental status.    Otherwise there has been no change in status. Patient has been taking medication as prescribed and there has been no recent change in medication or diet.  No recent antibiotics.  There has been no recent illness, travel or sick contacts.    EMS/ED Course: Patient received Lasix. Medical admission has been requested for further management of acute exacerbation of chronic diastolic congestive heart failure.  Review of Systems:  CONSTITUTIONAL: No fever/chills, fatigue, weakness, weight gain/loss, headache. EYES: No blurry or double vision. ENT: No tinnitus, postnasal drip, redness or soreness of the  oropharynx. RESPIRATORY: positive cough, dyspnea, wheeze.  No hemoptysis.  CARDIOVASCULAR: No chest pain, palpitations, syncope, orthopnea. No lower extremity edema.  GASTROINTESTINAL: No nausea, vomiting, abdominal pain, diarrhea, constipation.  No hematemesis, melena or hematochezia. GENITOURINARY: No dysuria, frequency, hematuria. ENDOCRINE: No polyuria or nocturia. No heat or cold intolerance. HEMATOLOGY: No anemia, bruising, bleeding. INTEGUMENTARY: No rashes, ulcers, lesions. MUSCULOSKELETAL: No arthritis, gout, dyspnea. NEUROLOGIC: No numbness, tingling, ataxia, seizure-type activity, weakness. PSYCHIATRIC: No anxiety, depression, insomnia.   Past Medical History:  Diagnosis Date  . Anemia   . Aortic stenosis, severe    a. s/p Magna Ease pericardial tissue valve size 21 mm replacement in 10/2011 for severe AS 11/12; b. echo 07/2015: EF 55-60% mod concentric LVH, GR1DD, LA mildly dilated, PASP 45 mm Hg  . Atrial tachycardia, paroxysmal (HCC)    a. with rate related LBBB.  . Cardiac pacemaker -st Judes    11/12  . Chronic diastolic heart failure ([place])    a. echo 2014: EF 55-60%, no RWMA, GR1DD, PASP 47 mm Hg; b. echo 07/2015: EF 55-60% mod concentric LVH, GR1DD, LA mildly dilated, PASP 45 mm Hg  . Complete heart block [facility])    a. s/p St Jude PPM 10/2011 (Ser # [id number]).  . Coronary artery disease    a. s/p 2v CABG 11/12 (VG-LAD, VG-OM1); b. Lexiscan 08/2015: low risk, no ischemia, EF 55-65%.  . Enthesopathy of hip region   . Esophageal reflux    followed by Dr.Seigal. stabilized with a combination of Nexium and Zantac  . Fibromyalgia   . HLD (hyperlipidemia)   . Hypertensive heart disease   . Knee joint replacement by other means   . Morbid obesity ([place])   . Neuralgia, neuritis, and radiculitis, unspecified   . Neuropathy   .  Onychia and paronychia of toe   . Osteoarthrosis, unspecified whether generalized or localized, pelvic region and thigh    mainly in her back and knees   . PAF (paroxysmal atrial fibrillation) ([place])    a. brief episodes of AF previously noted on device interrogations.  Marland Kitchen RAD (reactive airway disease)    a. chronic SOB  . Spinal stenosis   . Stress incontinence, female    followed by Dr.Cope  . Type II or unspecified type diabetes mellitus without mention of complication, not stated as uncontrolled    a. pt. reports that she is borderline   . Wide-complex tachycardia ([place])    a. Noted 4/10 - brief episode in ED. Noted again 4/21 in clinic->felt most likely to be atrial tach.    Past Surgical History:  Procedure Laterality Date  . AORTIC VALVE REPLACEMENT  10/17/2011   Procedure: AORTIC VALVE REPLACEMENT (AVR);  Surgeon: Melrose Nakayama, MD;  Location: [place];  Service: Open Heart Surgery;  Laterality: N/A;  . CARDIAC CATHETERIZATION  08/2009   50% stenosis distal left main, 50% stenosis ostial left circumflex.   Marland Kitchen CARDIAC CATHETERIZATION     at [facility]  . CORONARY ARTERY BYPASS GRAFT  10/17/2011   Procedure: CORONARY ARTERY BYPASS GRAFTING (CABG);  Surgeon: Melrose Nakayama, MD;  Location: [place];  Service: Open Heart Surgery;  Laterality: N/A;  Times Two, using right leg greater saphenous vein harvested endoscopically  . ESOPHAGOGASTRODUODENOSCOPY (EGD) WITH PROPOFOL N/A 10/12/2016   Procedure: ESOPHAGOGASTRODUODENOSCOPY (EGD) WITH PROPOFOL;  Surgeon: Lucilla Lame, MD;  Location: [facility] ENDOSCOPY;  Service: Endoscopy;  Laterality: N/A;  . EYE SURGERY     IOL/ after cataracts removed- bilateral   . NASAL SINUS SURGERY  2009  . PERMANENT PACEMAKER INSERTION N/A 10/20/2011   Procedure: PERMANENT PACEMAKER INSERTION;  Surgeon: Thompson Grayer, MD;  Location: [facility] CATH LAB;  Service: Cardiovascular;  Laterality: N/A;  . REPLACEMENT TOTAL KNEE  11/2006   right knee  . TOTAL HIP ARTHROPLASTY  09/2010     reports that she has never smoked. She has never used smokeless tobacco. She reports that she does not drink alcohol or use  drugs.  Allergies  Allergen Reactions  . Citalopram Other (See Comments)    Reaction:  Altered mental status   . Cymbalta [Duloxetine Hcl] Other (See Comments)    Reaction:  Sedative for pt   . Imipramine Other (See Comments)    Reaction:  Unknown   . Proton Pump Inhibitors Other (See Comments)    Reaction:  Unknown   . Venlafaxine Nausea And Vomiting and Other (See Comments)    Reaction:  Dizziness     Family History  Problem Relation Age of Onset  . Heart disease Sister   . Heart disease Brother   . Heart disease Brother   . Heart disease Brother   . Diabetes Other   . Anesthesia problems Neg Hx   . Hypotension Neg Hx   . Malignant hyperthermia Neg Hx   . Pseudochol deficiency Neg Hx     Prior to Admission medications   Medication Sig Start Date End Date Taking? Authorizing Provider  acetaminophen (TYLENOL) 650 MG CR tablet Take 650 mg by mouth 3 (three) times daily.   Yes [provider]  acidophilus (RISAQUAD) CAPS capsule Take 1 capsule by mouth 2 (two) times daily. 06/26/16  Yes Bettey Costa, MD  aluminum-magnesium hydroxide-simethicone (MAALOX) 657-846-96 MG/5ML SUSP Take 30 mLs by mouth every 4 (four) hours as needed.  Yes [provider]  aspirin EC 81 MG tablet Take 81 mg by mouth daily.    Yes [provider]  atorvastatin (LIPITOR) 80 MG tablet Take 1 tablet (80 mg total) by mouth daily. 06/26/17  Yes Epifanio Lesches, MD  carvedilol (COREG) 3.125 MG tablet Take 1 tablet (3.125 mg total) by mouth 2 (two) times daily. 06/25/17 06/25/18 Yes Epifanio Lesches, MD  famotidine (PEPCID) 20 MG tablet Take 1 tablet (20 mg total) by mouth 2 (two) times daily. 03/09/17  Yes Wieting, Richard, MD  furosemide (LASIX) 20 MG tablet Take 1 tablet (20 mg total) by mouth 2 (two) times daily. 06/25/17  Yes Epifanio Lesches, MD  gabapentin (NEURONTIN) 100 MG capsule Take 200 mg by mouth 2 (two) times daily.    Yes [provider]  lamoTRIgine  (LAMICTAL) 100 MG tablet Take 100 mg by mouth at bedtime.   Yes [provider]  lamoTRIgine (LAMICTAL) 25 MG tablet Take 50 mg by mouth every morning.   Yes [provider]  LORazepam (ATIVAN) 0.5 MG tablet Take 1 tablet (0.5 mg total) by mouth at bedtime. 06/25/17  Yes Epifanio Lesches, MD  magnesium hydroxide (MILK OF MAGNESIA) 400 MG/5ML suspension Take 10 mLs by mouth daily.    Yes [provider]  magnesium oxide (MAG-OX) 400 MG tablet Take 1 tablet (400 mg total) by mouth daily. 03/09/17  Yes Wieting, Richard, MD  metFORMIN (GLUCOPHAGE) 500 MG tablet Take 500 mg by mouth 2 (two) times daily with a meal.   Yes [provider]  metoCLOPramide (REGLAN) 5 MG tablet Take 1 tablet (5 mg total) by mouth 4 (four) times daily -  before meals and at bedtime. Patient taking differently: Take 5 mg by mouth daily.  03/09/17  Yes Wieting, Richard, MD  ondansetron (ZOFRAN) 4 MG tablet Take 4 mg by mouth every 6 (six) hours as needed for nausea or vomiting.    Yes [provider]  polyethylene glycol powder (GLYCOLAX/MIRALAX) powder Take 255 g by mouth daily. Mix entire contents of miralax (255g) with 64 oz of gatorade in a pitcher.   Drink 8oz of mixture every 30 minutes until you have multiple loose bowel movements. 03/16/17  Yes Harvest Dark, MD  Potassium Chloride ER 20 MEQ TBCR Take 20 mEq by mouth 2 (two) times daily. 03/09/17  Yes Wieting, Richard, MD  promethazine (PHENERGAN) 12.5 MG tablet Take 1 tablet (12.5 mg total) by mouth every 6 (six) hours as needed for nausea. Patient taking differently: Take 12.5 mg by mouth every 4 (four) hours as needed for nausea.  03/09/17  Yes Wieting, Richard, MD  QUEtiapine (SEROQUEL) 25 MG tablet Take 25 mg by mouth 2 (two) times daily.   Yes [provider]  sertraline (ZOLOFT) 50 MG tablet Take 3 tablets (150 mg total) by mouth at bedtime. 06/25/17  Yes Epifanio Lesches, MD  protein supplement shake (PREMIER  PROTEIN) LIQD Take 325 mLs (11 oz total) by mouth 2 (two) times daily between meals. 06/25/17   Epifanio Lesches, MD    Physical Exam: Vitals:   08/15/17 2052 08/15/17 2200 08/15/17 2230 08/15/17 2300  BP:  120/87 116/89 127/80  Pulse:  (!) 111 67 (!) 107  Resp:  (!) 26 (!) 27 (!) 25  Temp:      TempSrc:      SpO2:  100% 100% 100%  Weight: 98 kg (216 lb)     Height: 5\' 4"  (1.626 m)  GENERAL: 83 y.o.-year-old female patient, well-developed, well-nourished lying in the bed in no acute distress.  Pleasant and cooperative.   HEENT: Head atraumatic, normocephalic. Pupils equal. Mucus membranes moist. NECK: Supple, full range of motion. No JVD, no bruit heard. No thyroid enlargement, no tenderness, no cervical lymphadenopathy. CHEST: bibasilar rales, otherwise normal breath sounds bilaterally. No wheezing, rales, rhonchi or crackles. No use of accessory muscles of respiration.  No reproducible chest wall tenderness.  CARDIOVASCULAR: S1, S2 normal. No murmurs, rubs, or gallops. Cap refill <2 seconds. Pulses intact distally.  ABDOMEN: Soft, nondistended, nontender. No rebound, guarding, rigidity. Normoactive bowel sounds present in all four quadrants.  EXTREMITIES: No pedal edema, cyanosis, or clubbing. No calf tenderness or Homan's sign.  NEUROLOGIC: The patient is alert and oriented x 3. Cranial nerves II through XII are grossly intact with no focal sensorimotor deficit. PSYCHIATRIC:  Normal affect, mood, thought content. SKIN: Warm, dry, and intact without obvious rash, lesion, or ulcer.    Labs on Admission:  CBC:  Recent Labs Lab 08/15/17 2055  WBC 4.7  NEUTROABS 2.7  HGB 11.1*  HCT 33.2*  MCV 82.8  PLT 630   Basic Metabolic Panel:  Recent Labs Lab 08/15/17 2055  NA 141  K 4.0  CL 107  CO2 24  GLUCOSE 119*  BUN 17  CREATININE 0.72  CALCIUM 9.5   GFR: Estimated Creatinine Clearance: 59.5 mL/min (by C-G formula based on SCr of 0.72 mg/dL). Liver Function  Tests:  Recent Labs Lab 08/15/17 2055  AST 23  ALT 13*  ALKPHOS 88  BILITOT 0.6  PROT 7.4  ALBUMIN 3.7    Recent Labs Lab 08/15/17 2055  LIPASE 50   No results for input(s): AMMONIA in the last 168 hours. Coagulation Profile: No results for input(s): INR, PROTIME in the last 168 hours. Cardiac Enzymes:  Recent Labs Lab 08/15/17 2055  TROPONINI <0.03   BNP (last 3 results) No results for input(s): PROBNP in the last 8760 hours. HbA1C: No results for input(s): HGBA1C in the last 72 hours. CBG: No results for input(s): GLUCAP in the last 168 hours. Lipid Profile: No results for input(s): CHOL, HDL, LDLCALC, TRIG, CHOLHDL, LDLDIRECT in the last 72 hours. Thyroid Function Tests: No results for input(s): TSH, T4TOTAL, FREET4, T3FREE, THYROIDAB in the last 72 hours. Anemia Panel: No results for input(s): VITAMINB12, FOLATE, FERRITIN, TIBC, IRON, RETICCTPCT in the last 72 hours. Urine analysis:    Component Value Date/Time   COLORURINE COLORLESS (A) 06/13/2017 0017   APPEARANCEUR CLEAR (A) 06/13/2017 0017   APPEARANCEUR Clear 12/04/2014 1618   LABSPEC 1.003 (L) 06/13/2017 0017   LABSPEC 1.006 12/04/2014 1618   PHURINE 5.0 06/13/2017 0017   GLUCOSEU NEGATIVE 06/13/2017 0017   GLUCOSEU Negative 12/04/2014 1618   HGBUR NEGATIVE 06/13/2017 0017   BILIRUBINUR NEGATIVE 06/13/2017 0017   BILIRUBINUR Negative 12/04/2014 1618   KETONESUR NEGATIVE 06/13/2017 0017   PROTEINUR NEGATIVE 06/13/2017 0017   UROBILINOGEN 0.2 10/16/2011 0950   NITRITE NEGATIVE 06/13/2017 0017   LEUKOCYTESUR NEGATIVE 06/13/2017 0017   LEUKOCYTESUR Trace 12/04/2014 1618   Sepsis Labs: @LABRCNTIP (procalcitonin:4,lacticidven:4) )No results found for this or any previous visit (from the past 240 hour(s)).   Radiological Exams on Admission: Dg Chest 2 View  Result Date: 08/15/2017 CLINICAL DATA:  Dyspnea starting yesterday and worse today. Cough with thick sputum and fever. EXAM: CHEST  2 VIEW  COMPARISON:  06/12/2017 FINDINGS: Stable cardiomegaly mild uncoiling of the thoracic aorta. The patient is status post median sternotomy with  aortic valvular replacement. Left-sided pacemaker apparatus with right atrial and right ventricular leads appear stable. Mild diffuse interstitial prominence is again noted with peribronchial thickening consistent chronic bronchitic change. No effusion or pneumothorax. Osteoarthritis of the [facility] and glenohumeral joints bilaterally. Degenerative changes are seen along the dorsal spine. IMPRESSION: 1. Stable cardiomegaly with aortic atherosclerosis. 2. Status post CABG and aortic valvular replacement. 3. Mild diffuse interstitial prominence peribronchial thickening consistent with chronic bronchitic change. Electronically Signed   By: Ashley Royalty M.D.   On: 08/15/2017 21:39    EKG: Ventricular paced at 114 bpm with normal axis and nonspecific ST-T wave changes.   Assessment/Plan  This is a 83 y.o. female with a history of severe aortic stenosis status post valve replacement, paroxysmal atrial fibrillation, pacemaker, chronic diastolic heart failure, coronary artery disease status post CABG, GERD, hypertension, hyperlipidemia, reactive airway disease, incontinence, Type 2 diabetes now being admitted with:  #. Acute exacerbation of congestive heart failure - Telemetry monitoring. - IV Lasix, beta blocker,   - Intake/output, daily weight. - Trend troponins, check lipids and TSH. - Repeat echo - Cardiology consultation requested  #. History of hyperlipidemia Continue Lipitor  #. History of hypertension/PAF Continue Coreg  #. History of depression - Continue Zoloft  #. History of GERD - Continue Pepcid  #. History of CAD - Continue aspirin  #. History of H/o Diabetes - Accuchecks achs with RISS coverage - Heart healthy, carb controlled diet  Admission status: Inpatient IV Fluids: HL Diet/Nutrition: HH, CC Consults called: Cardio  DVT Px:  Lovenox, SCDs and early ambulation. Code Status: Full Code  Disposition Plan: To home in 2-3 days  All the records are reviewed and case discussed with ED provider. Management plans discussed with the patient and/or family who express understanding and agree with plan of care.  Rosealyn Little D.O. on 08/15/2017 at 11:59 PM Between 7am to 6pm - Pager - [phone] After 6pm go to [email or web address] - [employer] [facility] [place] [facility] [phone] CC: Primary care physician; [name], MD   08/15/2017, 11:59 PM

## 2017-08-16 DIAGNOSIS — I5031 Acute diastolic (congestive) heart failure: Secondary | ICD-10-CM | POA: Diagnosis present

## 2017-08-16 DIAGNOSIS — K219 Gastro-esophageal reflux disease without esophagitis: Secondary | ICD-10-CM | POA: Diagnosis present

## 2017-08-16 DIAGNOSIS — Z952 Presence of prosthetic heart valve: Secondary | ICD-10-CM | POA: Diagnosis not present

## 2017-08-16 DIAGNOSIS — Z96651 Presence of right artificial knee joint: Secondary | ICD-10-CM | POA: Diagnosis present

## 2017-08-16 DIAGNOSIS — I350 Nonrheumatic aortic (valve) stenosis: Secondary | ICD-10-CM | POA: Diagnosis present

## 2017-08-16 DIAGNOSIS — J45909 Unspecified asthma, uncomplicated: Secondary | ICD-10-CM | POA: Diagnosis present

## 2017-08-16 DIAGNOSIS — I5033 Acute on chronic diastolic (congestive) heart failure: Secondary | ICD-10-CM | POA: Diagnosis present

## 2017-08-16 DIAGNOSIS — Z79899 Other long term (current) drug therapy: Secondary | ICD-10-CM | POA: Diagnosis not present

## 2017-08-16 DIAGNOSIS — N393 Stress incontinence (female) (male): Secondary | ICD-10-CM | POA: Diagnosis present

## 2017-08-16 DIAGNOSIS — E119 Type 2 diabetes mellitus without complications: Secondary | ICD-10-CM | POA: Diagnosis present

## 2017-08-16 DIAGNOSIS — Z888 Allergy status to other drugs, medicaments and biological substances status: Secondary | ICD-10-CM | POA: Diagnosis not present

## 2017-08-16 DIAGNOSIS — R0902 Hypoxemia: Secondary | ICD-10-CM | POA: Diagnosis present

## 2017-08-16 DIAGNOSIS — I2510 Atherosclerotic heart disease of native coronary artery without angina pectoris: Secondary | ICD-10-CM | POA: Diagnosis present

## 2017-08-16 DIAGNOSIS — I480 Paroxysmal atrial fibrillation: Secondary | ICD-10-CM | POA: Diagnosis present

## 2017-08-16 DIAGNOSIS — Z951 Presence of aortocoronary bypass graft: Secondary | ICD-10-CM | POA: Diagnosis not present

## 2017-08-16 DIAGNOSIS — Z66 Do not resuscitate: Secondary | ICD-10-CM | POA: Diagnosis present

## 2017-08-16 DIAGNOSIS — F329 Major depressive disorder, single episode, unspecified: Secondary | ICD-10-CM | POA: Diagnosis present

## 2017-08-16 DIAGNOSIS — Z6835 Body mass index (BMI) 35.0-35.9, adult: Secondary | ICD-10-CM | POA: Diagnosis not present

## 2017-08-16 DIAGNOSIS — Z96649 Presence of unspecified artificial hip joint: Secondary | ICD-10-CM | POA: Diagnosis present

## 2017-08-16 DIAGNOSIS — Z8249 Family history of ischemic heart disease and other diseases of the circulatory system: Secondary | ICD-10-CM | POA: Diagnosis not present

## 2017-08-16 DIAGNOSIS — Z7982 Long term (current) use of aspirin: Secondary | ICD-10-CM | POA: Diagnosis not present

## 2017-08-16 DIAGNOSIS — I110 Hypertensive heart disease with heart failure: Secondary | ICD-10-CM | POA: Diagnosis present

## 2017-08-16 DIAGNOSIS — E785 Hyperlipidemia, unspecified: Secondary | ICD-10-CM | POA: Diagnosis present

## 2017-08-16 DIAGNOSIS — I495 Sick sinus syndrome: Secondary | ICD-10-CM | POA: Diagnosis present

## 2017-08-16 LAB — TROPONIN I: Troponin I: <0.03 ng/mL (ref <0.03)

## 2017-08-16 LAB — TSH: TSH: 1.937 u[IU]/mL (ref 0.350–4.500)

## 2017-08-16 MED ORDER — POLYETHYLENE GLYCOL 3350 17 G PO PACK
17.0000 g | PACK | Freq: Every day | ORAL | Status: DC
  Administered 2017-08-16 – 2017-08-17 (×2): 17 g via ORAL
  Filled 2017-08-16 (×2): qty 1

## 2017-08-16 MED ORDER — ACETAMINOPHEN 325 MG PO TABS
650.0000 mg | ORAL_TABLET | ORAL | Status: DC | PRN
  Administered 2017-08-16 – 2017-08-17 (×2): 650 mg via ORAL
  Filled 2017-08-16 (×2): qty 2

## 2017-08-16 MED ORDER — QUETIAPINE FUMARATE 25 MG PO TABS
25.0000 mg | ORAL_TABLET | Freq: Two times a day (BID) | ORAL | Status: DC
  Administered 2017-08-16 – 2017-08-17 (×4): 25 mg via ORAL
  Filled 2017-08-16 (×4): qty 1

## 2017-08-16 MED ORDER — SODIUM CHLORIDE 0.9% FLUSH
3.0000 mL | Freq: Two times a day (BID) | INTRAVENOUS | Status: DC
  Administered 2017-08-16 – 2017-08-17 (×4): 3 mL via INTRAVENOUS

## 2017-08-16 MED ORDER — CARVEDILOL 6.25 MG PO TABS
3.1250 mg | ORAL_TABLET | Freq: Two times a day (BID) | ORAL | Status: DC
  Administered 2017-08-16 – 2017-08-17 (×3): 3.125 mg via ORAL
  Filled 2017-08-16 (×4): qty 1

## 2017-08-16 MED ORDER — RISAQUAD PO CAPS
1.0000 | ORAL_CAPSULE | Freq: Two times a day (BID) | ORAL | Status: DC
  Administered 2017-08-16 – 2017-08-17 (×4): 1 via ORAL
  Filled 2017-08-16 (×4): qty 1

## 2017-08-16 MED ORDER — SODIUM CHLORIDE 0.9% FLUSH: 3.0000 mL | INTRAVENOUS | Status: DC | PRN

## 2017-08-16 MED ORDER — ENOXAPARIN SODIUM 40 MG/0.4ML ~~LOC~~ SOLN
40.0000 mg | SUBCUTANEOUS | Status: DC
  Administered 2017-08-16: 40 mg via SUBCUTANEOUS
  Filled 2017-08-16 (×2): qty 0.4

## 2017-08-16 MED ORDER — POLYETHYLENE GLYCOL 3350 17 GM/SCOOP PO POWD: 1.0000 | Freq: Every day | ORAL | Status: DC

## 2017-08-16 MED ORDER — PROMETHAZINE HCL 25 MG PO TABS
12.5000 mg | ORAL_TABLET | ORAL | Status: DC | PRN
  Filled 2017-08-16: qty 1

## 2017-08-16 MED ORDER — ONDANSETRON HCL 4 MG PO TABS: 4.0000 mg | ORAL_TABLET | Freq: Four times a day (QID) | ORAL | Status: DC | PRN

## 2017-08-16 MED ORDER — GABAPENTIN 100 MG PO CAPS
200.0000 mg | ORAL_CAPSULE | Freq: Two times a day (BID) | ORAL | Status: DC
Start: 2017-08-16 — End: 2017-08-17
  Administered 2017-08-16 – 2017-08-17 (×4): 200 mg via ORAL
  Filled 2017-08-16 (×4): qty 2

## 2017-08-16 MED ORDER — ALUM & MAG HYDROXIDE-SIMETH 200-200-20 MG/5ML PO SUSP: 30.0000 mL | ORAL | Status: DC | PRN

## 2017-08-16 MED ORDER — METOCLOPRAMIDE HCL 10 MG PO TABS
5.0000 mg | ORAL_TABLET | Freq: Every day | ORAL | Status: DC
  Administered 2017-08-16 – 2017-08-17 (×2): 5 mg via ORAL
  Filled 2017-08-16 (×2): qty 1

## 2017-08-16 MED ORDER — LAMOTRIGINE 100 MG PO TABS
100.0000 mg | ORAL_TABLET | Freq: Every day | ORAL | Status: DC
  Administered 2017-08-16 (×2): 100 mg via ORAL
  Filled 2017-08-16 (×2): qty 1

## 2017-08-16 MED ORDER — SERTRALINE HCL 50 MG PO TABS
150.0000 mg | ORAL_TABLET | Freq: Every day | ORAL | Status: DC
  Administered 2017-08-16 (×2): 150 mg via ORAL
  Filled 2017-08-16 (×2): qty 3

## 2017-08-16 MED ORDER — MAGNESIUM HYDROXIDE 400 MG/5ML PO SUSP
10.0000 mL | Freq: Every day | ORAL | Status: DC
  Administered 2017-08-16 – 2017-08-17 (×2): 10 mL via ORAL
  Filled 2017-08-16 (×2): qty 30

## 2017-08-16 MED ORDER — LAMOTRIGINE 25 MG PO TABS
50.0000 mg | ORAL_TABLET | ORAL | Status: DC
Start: 2017-08-16 — End: 2017-08-17
  Administered 2017-08-16 – 2017-08-17 (×2): 50 mg via ORAL
  Filled 2017-08-16 (×2): qty 2

## 2017-08-16 MED ORDER — LORAZEPAM 0.5 MG PO TABS
0.5000 mg | ORAL_TABLET | Freq: Every day | ORAL | Status: DC
Start: 2017-08-16 — End: 2017-08-17
  Administered 2017-08-16 (×2): 0.5 mg via ORAL
  Filled 2017-08-16 (×2): qty 1

## 2017-08-16 MED ORDER — FAMOTIDINE 20 MG PO TABS
20.0000 mg | ORAL_TABLET | Freq: Two times a day (BID) | ORAL | Status: DC
  Administered 2017-08-16 – 2017-08-17 (×4): 20 mg via ORAL
  Filled 2017-08-16 (×4): qty 1

## 2017-08-16 MED ORDER — ASPIRIN EC 81 MG PO TBEC
81.0000 mg | DELAYED_RELEASE_TABLET | Freq: Every day | ORAL | Status: DC
  Administered 2017-08-16 – 2017-08-17 (×2): 81 mg via ORAL
  Filled 2017-08-16 (×2): qty 1

## 2017-08-16 MED ORDER — FUROSEMIDE 10 MG/ML IJ SOLN
20.0000 mg | Freq: Every day | INTRAMUSCULAR | Status: DC
  Administered 2017-08-16 – 2017-08-17 (×2): 20 mg via INTRAVENOUS
  Filled 2017-08-16 (×2): qty 2

## 2017-08-16 MED ORDER — MAGNESIUM OXIDE 400 (241.3 MG) MG PO TABS
400.0000 mg | ORAL_TABLET | Freq: Every day | ORAL | Status: DC
  Administered 2017-08-16 – 2017-08-17 (×2): 400 mg via ORAL
  Filled 2017-08-16 (×2): qty 1

## 2017-08-16 MED ORDER — ATORVASTATIN CALCIUM 20 MG PO TABS
80.0000 mg | ORAL_TABLET | Freq: Every day | ORAL | Status: DC
  Administered 2017-08-16: 80 mg via ORAL
  Filled 2017-08-16: qty 4

## 2017-08-16 MED ORDER — PREMIER PROTEIN SHAKE
11.0000 [oz_av] | Freq: Two times a day (BID) | ORAL | Status: DC
  Administered 2017-08-16 – 2017-08-17 (×3): 11 [oz_av] via ORAL

## 2017-08-16 MED ORDER — POTASSIUM CHLORIDE CRYS ER 20 MEQ PO TBCR
20.0000 meq | EXTENDED_RELEASE_TABLET | Freq: Two times a day (BID) | ORAL | Status: DC
  Administered 2017-08-16 – 2017-08-17 (×4): 20 meq via ORAL
  Filled 2017-08-16 (×4): qty 1

## 2017-08-16 MED ORDER — SODIUM CHLORIDE 0.9 % IV SOLN: 250.0000 mL | INTRAVENOUS | Status: DC | PRN

## 2017-08-16 MED ORDER — ORAL CARE MOUTH RINSE
15.0000 mL | Freq: Two times a day (BID) | OROMUCOSAL | Status: DC
  Administered 2017-08-16 – 2017-08-17 (×3): 15 mL via OROMUCOSAL

## 2017-08-16 NOTE — Consult Note (Signed)
Reason for Consult: Shortness of breath congestive heart failure Referring Physician: Primary physician is Dr Malka So is an 83 y.o. female.  HPI: Presents with severe aortic stenosis history of aortic valve replacement paroxysmal atrial fibrillation permanent pacemaker chronic diastolic heart failure coronary disease status post coronary bypass surgery GERD hypertension hyperlipidemia reactive airway disease and diabetes presented emergency room with shortness of breath. Patient should have low back to 3 days ago and started on progressive shortness of breath or dyspnea on exertion and nonproductive cough orthopnea PND. Patient also notes worsening lower leg edema. Patient has similar presentation back in July with acute exacerbation of congestive heart failure. Patient was treated with diuretic therapy intravenously which seemed to help with symptom as well as inhalers and supplemental oxygen  Past Medical History:  Diagnosis Date  . Anemia   . Aortic stenosis, severe    a. s/p Magna Ease pericardial tissue valve size 21 mm replacement in 10/2011 for severe AS 11/12; b. echo 07/2015: EF 55-60% mod concentric LVH, GR1DD, LA mildly dilated, PASP 45 mm Hg  . Atrial tachycardia, paroxysmal (HCC)    a. with rate related LBBB.  . Cardiac pacemaker -st Judes    11/12  . Chronic diastolic heart failure ([place])    a. echo 2014: EF 55-60%, no RWMA, GR1DD, PASP 47 mm Hg; b. echo 07/2015: EF 55-60% mod concentric LVH, GR1DD, LA mildly dilated, PASP 45 mm Hg  . Complete heart block [facility])    a. s/p St Jude PPM 10/2011 (Ser # [id number]).  . Coronary artery disease    a. s/p 2v CABG 11/12 (VG-LAD, VG-OM1); b. Lexiscan 08/2015: low risk, no ischemia, EF 55-65%.  . Enthesopathy of hip region   . Esophageal reflux    followed by Dr.Seigal. stabilized with a combination of Nexium and Zantac  . Fibromyalgia   . HLD (hyperlipidemia)   . Hypertensive heart disease   . Knee joint replacement by  other means   . Morbid obesity ([place])   . Neuralgia, neuritis, and radiculitis, unspecified   . Neuropathy   . Onychia and paronychia of toe   . Osteoarthrosis, unspecified whether generalized or localized, pelvic region and thigh    mainly in her back and knees  . PAF (paroxysmal atrial fibrillation) ([place])    a. brief episodes of AF previously noted on device interrogations.  Marland Kitchen RAD (reactive airway disease)    a. chronic SOB  . Spinal stenosis   . Stress incontinence, female    followed by Dr.Cope  . Type II or unspecified type diabetes mellitus without mention of complication, not stated as uncontrolled    a. pt. reports that she is borderline   . Wide-complex tachycardia ([place])    a. Noted 4/10 - brief episode in ED. Noted again 4/21 in clinic->felt most likely to be atrial tach.    Past Surgical History:  Procedure Laterality Date  . AORTIC VALVE REPLACEMENT  10/17/2011   Procedure: AORTIC VALVE REPLACEMENT (AVR);  Surgeon: Melrose Nakayama, MD;  Location: [place];  Service: Open Heart Surgery;  Laterality: N/A;  . CARDIAC CATHETERIZATION  08/2009   50% stenosis distal left main, 50% stenosis ostial left circumflex.   Marland Kitchen [facility]ARDI[facility]  . CORONARY ARTERY BYPASS GRAFT  10/17/2011   Procedure: CORONARY ARTERY BYPASS GRAFTING (CABG);  Surgeon: Melrose Nakayama, MD;  Location: [place];  Service: Open Heart Surgery;  Laterality: N/A;  Times Two, using right  leg greater saphenous vein harvested endoscopically  . ESOPHAGOGASTRODUODENOSCOPY (EGD) WITH PROPOFOL N/A 10/12/2016   Procedure: ESOPHAGOGASTRODUODENOSCOPY (EGD) WITH PROPOFOL;  Surgeon: Lucilla Lame, MD;  Location: [facility] ENDOSCOPY;  Service: Endoscopy;  Laterality: N/A;  . EYE SURGERY     IOL/ after cataracts removed- bilateral   . NASAL SINUS SURGERY  2009  . PERMANENT PACEMAKER INSERTION N/A 10/20/2011   Procedure: PERMANENT PACEMAKER INSERTION;  Surgeon: Thompson Grayer, MD;  Location: [facility] CATH LAB;  Service:  Cardiovascular;  Laterality: N/A;  . REPLACEMENT TOTAL KNEE  11/2006   right knee  . TOTAL HIP ARTHROPLASTY  09/2010    Family History  Problem Relation Age of Onset  . Heart disease Sister   . Heart disease Brother   . Heart disease Brother   . Heart disease Brother   . Diabetes Other   . Anesthesia problems Neg Hx   . Hypotension Neg Hx   . Malignant hyperthermia Neg Hx   . Pseudochol deficiency Neg Hx     Social History:  reports that she has never smoked. She has never used smokeless tobacco. She reports that she does not drink alcohol or use drugs.  Allergies:  Allergies  Allergen Reactions  . Citalopram Other (See Comments)    Reaction:  Altered mental status   . Cymbalta [Duloxetine Hcl] Other (See Comments)    Reaction:  Sedative for pt   . Imipramine Other (See Comments)    Reaction:  Unknown   . Proton Pump Inhibitors Other (See Comments)    Reaction:  Unknown   . Venlafaxine Nausea And Vomiting and Other (See Comments)    Reaction:  Dizziness     Medications: I have reviewed the patient's current medications.  Results for orders placed or performed during the hospital encounter of 08/15/17 (from the past 48 hour(s))  CBC with Differential     Status: Abnormal   Collection Time: 08/15/17  8:55 PM  Result Value Ref Range   WBC 4.7 3.6 - 11.0 K/uL   RBC 4.01 3.80 - 5.20 MIL/uL   Hemoglobin 11.1 (L) 12.0 - 16.0 g/dL   HCT 33.2 (L) 35.0 - 47.0 %   MCV 82.8 80.0 - 100.0 fL   MCH 27.6 26.0 - 34.0 pg   MCHC 33.4 32.0 - 36.0 g/dL   RDW 18.9 (H) 11.5 - 14.5 %   Platelets 224 150 - 440 K/uL   Neutrophils Relative % 59 %   Neutro Abs 2.7 1.4 - 6.5 K/uL   Lymphocytes Relative 35 %   Lymphs Abs 1.7 1.0 - 3.6 K/uL   Monocytes Relative 3 %   Monocytes Absolute 0.2 0.2 - 0.9 K/uL   Eosinophils Relative 2 %   Eosinophils Absolute 0.1 0 - 0.7 K/uL   Basophils Relative 1 %   Basophils Absolute 0.0 0 - 0.1 K/uL  Brain natriuretic peptide     Status: Abnormal    Collection Time: 08/15/17  8:55 PM  Result Value Ref Range   B Natriuretic Peptide 617.0 (H) 0.0 - 100.0 pg/mL  Comprehensive metabolic panel     Status: Abnormal   Collection Time: 08/15/17  8:55 PM  Result Value Ref Range   Sodium 141 135 - 145 mmol/L   Potassium 4.0 3.5 - 5.1 mmol/L   Chloride 107 101 - 111 mmol/L   CO2 24 22 - 32 mmol/L   Glucose, Bld 119 (H) 65 - 99 mg/dL   BUN 17 6 - 20 mg/dL   Creatinine, Ser  0.72 0.44 - 1.00 mg/dL   Calcium 9.5 8.9 - 10.3 mg/dL   Total Protein 7.4 6.5 - 8.1 g/dL   Albumin 3.7 3.5 - 5.0 g/dL   AST 23 15 - 41 U/L   ALT 13 (L) 14 - 54 U/L   Alkaline Phosphatase 88 38 - 126 U/L   Total Bilirubin 0.6 0.3 - 1.2 mg/dL   GFR calc non Af Amer >60 >60 mL/min   GFR calc Af Amer >60 >60 mL/min    Comment: (NOTE) The eGFR has been calculated using the CKD EPI equation. This calculation has not been validated in all clinical situations. eGFR's persistently <60 mL/min signify possible Chronic Kidney Disease.    Anion gap 10 5 - 15  Troponin I     Status: None   Collection Time: 08/15/17  8:55 PM  Result Value Ref Range   Troponin I <0.03 <0.03 ng/mL  Lipase, blood     Status: None   Collection Time: 08/15/17  8:55 PM  Result Value Ref Range   Lipase 50 11 - 51 U/L  Culture, blood (routine x 2)     Status: None (Preliminary result)   Collection Time: 08/15/17 10:52 PM  Result Value Ref Range   Specimen Description BLOOD LAC    Special Requests      BOTTLES DRAWN AEROBIC AND ANAEROBIC Blood Culture results may not be optimal due to an excessive volume of blood received in culture bottles   Culture NO GROWTH < 12 HOURS    Report Status PENDING   Culture, blood (routine x 2)     Status: None (Preliminary result)   Collection Time: 08/15/17 10:52 PM  Result Value Ref Range   Specimen Description BLOOD RAC    Special Requests BOTTLES DRAWN AEROBIC AND ANAEROBIC BCAV    Culture NO GROWTH < 12 HOURS    Report Status PENDING   TSH     Status: None    Collection Time: 08/16/17  1:58 AM  Result Value Ref Range   TSH 1.937 0.350 - 4.500 uIU/mL    Comment: Performed by a 3rd Generation assay with a functional sensitivity of <=0.01 uIU/mL.  Troponin I     Status: None   Collection Time: 08/16/17  1:58 AM  Result Value Ref Range   Troponin I <0.03 <0.03 ng/mL  Troponin I     Status: None   Collection Time: 08/16/17  8:41 AM  Result Value Ref Range   Troponin I <0.03 <0.03 ng/mL  Troponin I     Status: None   Collection Time: 08/16/17  2:07 PM  Result Value Ref Range   Troponin I <0.03 <0.03 ng/mL    Dg Chest 2 View  Result Date: 08/15/2017 CLINICAL DATA:  Dyspnea starting yesterday and worse today. Cough with thick sputum and fever. EXAM: CHEST  2 VIEW COMPARISON:  06/12/2017 FINDINGS: Stable cardiomegaly mild uncoiling of the thoracic aorta. The patient is status post median sternotomy with aortic valvular replacement. Left-sided pacemaker apparatus with right atrial and right ventricular leads appear stable. Mild diffuse interstitial prominence is again noted with peribronchial thickening consistent chronic bronchitic change. No effusion or pneumothorax. Osteoarthritis of the [facility] and glenohumeral joints bilaterally. Degenerative changes are seen along the dorsal spine. IMPRESSION: 1. Stable cardiomegaly with aortic atherosclerosis. 2. Status post CABG and aortic valvular replacement. 3. Mild diffuse interstitial prominence peribronchial thickening consistent with chronic bronchitic change. Electronically Signed   By: Ashley Royalty M.D.   On: 08/15/2017 21:39  Review of Systems  Constitutional: Positive for malaise/fatigue.  HENT: Positive for congestion.   Eyes: Negative.   Respiratory: Positive for shortness of breath.   Cardiovascular: Positive for chest pain, orthopnea and PND.  Gastrointestinal: Negative.   Genitourinary: Negative.   Musculoskeletal: Negative.   Skin: Negative.   Neurological: Positive for weakness.   Endo/Heme/Allergies: Negative.   Psychiatric/Behavioral: Negative.    Blood pressure (!) 100/43, pulse 100, temperature (!) 97.5 F (36.4 C), resp. rate 18, height 5' 4"  (1.626 m), weight 203 lb 14.4 oz (92.5 kg), SpO2 98 %. Physical Exam  Nursing note and vitals reviewed. Constitutional: She is oriented to person, place, and time. She appears well-developed and well-nourished.  HENT:  Head: Normocephalic and atraumatic.  Eyes: Pupils are equal, round, and reactive to light. Conjunctivae are normal.  Neck: Normal range of motion. Neck supple.  Cardiovascular: Normal rate, regular rhythm and normal heart sounds.   Respiratory: Effort normal and breath sounds normal.  GI: Soft. Bowel sounds are normal.  Musculoskeletal: Normal range of motion.  Neurological: She is alert and oriented to person, place, and time. She has normal reflexes.  Skin: Skin is warm and dry.  Psychiatric: She has a normal mood and affect.    Assessment/Plan: Congestive heart failure diastolic History of aortic valve replacement because of aortic stenosis Shortness of breath Obesity Coronary bypass surgery Sick sinus syndrome  permanent pacemaker inplace GERD Hyperlipidemia Bilateral edema Diabetes type 2 Atrial fibrillation Possible dementia . Plan Continue telemetry monitor IV Lasix therapy to help with heart failure Continue beta blocker therapy , And follow-up daily weights and inputs and outputs Continue to follow-up EKGs and troponins Continue rate control with Coreg for atrial fibrillation Maintained Zoloft for depression symptoms Agree with Prevacid for GERD Supplemental oxygen as necessary for shortness of breath congestive heart failure hypoxemia Pacemaker appears to be functioning adequately  Michele Schultz 08/16/2017, 5:24 PM

## 2017-08-16 NOTE — Progress Notes (Signed)
SPOKE WITH DR. PATEL REGARDING BP 103/65 HR 80 HAS SCHEDULED COREG 3.125 AND 20MG  IV LASIX. PER MD HOLD COREG WILL ASSESS PATIENT AND DECIDED IF PATIENT NEEDS OR CAN HOLD LASIX

## 2017-08-16 NOTE — Progress Notes (Signed)
Received a call from the hospice nurseKaren stating that patient is a DO NOT RESUSCITATE. Also during her previous admission patient was DO NOT RESUSCITATE will change her status to DO NOT RESUSCITATE

## 2017-08-16 NOTE — Progress Notes (Signed)
Patient was transferred from the ER following c/o progressive SOB . On admission to the unit patient was A&O X4, denied being in any discomfort. Skin was checked with another RN, cardiac monitor placed and admission documentation completed with patient. Patient has no acute event overnight, VPaced with stable VS.

## 2017-08-16 NOTE — Progress Notes (Signed)
Visit made. Patient is currently followed by [facility] at [facility] with a diagnosis of aortic valve stenosis. She is a DNR code with out of facility DNR in place. This was NOT sent with her. She was sent to the [facility]  ED for evaluation of shortness of breath, she does not wear oxygen at baseline, but does have a standing order for oxygen in place at the facility. [place] did not notify hospice prior to transport. Patient seen lying in bed, eyes closed, easily awakened by voice, denied pain or discomfort. Oxygen in place at 2 liters via nasal cannlua. oxygen saturations 98-100%. Per report of staff RN Crystal, patient ate well at breakfast. Per report of patient's hospice RN she stands and pivots at baseline. No family present at time of visit. Hospital care team all aware of hospice involvement. Thank you Flo Shanks RN, BSN, [place] [facility], hospital Liaison [phone]

## 2017-08-16 NOTE — Progress Notes (Signed)
Initial Nutrition Assessment  DOCUMENTATION CODES:   Severe malnutrition in context of chronic illness  INTERVENTION:  1. Continue Premier Protein BID, each supplement provides 160 calories and 30 grams of protein  NUTRITION DIAGNOSIS:   Malnutrition (Severe) related to chronic illness (Chronic nausea) as evidenced by percent weight loss, energy intake < or equal to 75% for > or equal to 1 month  GOAL:   Patient will meet greater than or equal to 90% of their needs  MONITOR:   PO intake, I & O's, Labs, Weight trends, Supplement acceptance  REASON FOR ASSESSMENT:   Malnutrition Screening Tool    ASSESSMENT:   This is a 83 y.o. female with a history of severe aortic stenosis status post valve replacement, paroxysmal atrial fibrillation, pacemaker, chronic diastolic heart failure, coronary artery disease status post CABG, GERD, hypertension, hyperlipidemia, reactive airway disease, incontinence, Type 2 diabetes, presents with Acute on Chronic CHF  Spoke with Ms. Rhesa, she is very sleepy today. Well known to our service. Continues to lose weight, stating "I don't eat anything." I repeatedly asked her to clarify this and she repeated "I don't eat anything." Weight loss since 7/22 per chart has been 8#/3.7%, insignificant for timeframe. Weight loss since 4/13 has been 38#/15.7% severe for timeframe. She complains she has lost weight without an indication of an amount. Unsure how much weight loss is related to fluid accumulations. She does not appear fluid overloaded upon physical exam. Reports drinking lots of water, but no ensure or nutrition supplements at home.   Still complaining of nausea. This is a chronic problem for her, on reglan. Given her lack of PO intake and weight loss she meets criteria for severe malnutrition in context of chronic illness.  Nutrition-Focused physical exam completed. Findings are no fat depletion, no muscle depletion, and no edema.  Ate some eggs, bacon  and grits this morning. Documented meal completion 100% for breakfast.  Labs reviewed:  BNP 617  Medications reviewed and include:  Acidophilus 1 capsule BID, Mag-Ox Reglan, Miralax 20 K+ BID  Intake/Output Summary (Last 24 hours) at 08/16/17 1243 Last data filed at 08/16/17 1200  Gross per 24 hour  Intake              480 ml  Output             2050 ml  Net            -1570 ml    Diet Order:  Diet heart healthy/carb modified Room service appropriate? Yes; Fluid consistency: Thin  Skin:  Reviewed, no issues  Last BM:  08/15/2017  Height:   Ht Readings from Last 1 Encounters:  08/15/17 5\' 4"  (1.626 m)    Weight:   Wt Readings from Last 1 Encounters:  08/16/17 203 lb 14.4 oz (92.5 kg)    Ideal Body Weight:  54.54 kg  BMI:  Body mass index is 35 kg/m.  Estimated Nutritional Needs:   Kcal:  1600-1920 calories (25-30 cal/kg ABW)  Protein:  120-140 grams (1.2-1.4g/kg)  Fluid:  1.6-1.9L  EDUCATION NEEDS:   No education needs identified at this time  Satira Anis. Zakhari Fogel, MS, RD LDN Inpatient Clinical Dietitian Pager [phone]

## 2017-08-16 NOTE — Progress Notes (Signed)
[place] at [facility]                                                                                                                                                                                  Patient Demographics   Michele Schultz, is a 83 y.o. female, DOB - [birth year], WUX:[id number]  Admit date - 08/15/2017   Admitting Physician Harvie Bridge, DO  Outpatient Primary MD for the patient is [name], MD   LOS - 0  Subjective: Patient admitted with shortness of breath She is a very poor historian    Review of Systems:   CONSTITUTIONAL: No documented fever. No fatigue, weakness. No weight gain, no weight loss.  EYES: No blurry or double vision.  ENT: No tinnitus. No postnasal drip. No redness of the oropharynx.  RESPIRATORY: No cough, no wheeze, no hemoptysis. Positive dyspnea.  CARDIOVASCULAR: No chest pain. No orthopnea. No palpitations. No syncope.  GASTROINTESTINAL: No nausea, no vomiting or diarrhea. No abdominal pain. No melena or hematochezia.  GENITOURINARY: No dysuria or hematuria.  ENDOCRINE: No polyuria or nocturia. No heat or cold intolerance.  HEMATOLOGY: No anemia. No bruising. No bleeding.  INTEGUMENTARY: No rashes. No lesions.  MUSCULOSKELETAL: No arthritis. No swelling. No gout.  NEUROLOGIC: No numbness, tingling, or ataxia. No seizure-type activity.  PSYCHIATRIC: No anxiety. No insomnia. No ADD.    Vitals:   Vitals:   08/16/17 0129 08/16/17 0448 08/16/17 0942 08/16/17 1114  BP: 111/64 (!) 92/56 103/65 (!) 100/43  Pulse: 82 100 (!) 112 100  Resp: 18 18 18 18   Temp: (!) 97.5 F (36.4 C) 97.8 F (36.6 C) 98.3 F (36.8 C) (!) 97.5 F (36.4 C)  TempSrc: Oral Oral Oral   SpO2: 98% 100% 100% 98%  Weight: 203 lb 14.4 oz (92.5 kg)     Height:        Wt Readings from Last 3 Encounters:  08/16/17 203 lb 14.4 oz (92.5 kg)  06/24/17 211 lb 6.4 oz (95.9 kg)  03/16/17 241 lb (109.3 kg)     Intake/Output  Summary (Last 24 hours) at 08/16/17 1429 Last data filed at 08/16/17 1407  Gross per 24 hour  Intake              960 ml  Output             2675 ml  Net            -1715 ml    Physical Exam:   GENERAL: Pleasant-appearing in no apparent distress.  HEAD, EYES, EARS, NOSE AND THROAT: Atraumatic, normocephalic. Extraocular muscles are intact. Pupils equal and reactive to light. Sclerae anicteric. No  conjunctival injection. No oro-pharyngeal erythema.  NECK: Supple. There is no jugular venous distention. No bruits, no lymphadenopathy, no thyromegaly.  HEART: Regular rate and rhythm,. No murmurs, no rubs, no clicks.  LUNGS: occasional crackles bilaterally without any necessary muscle usage  ABDOMEN: Soft, flat, nontender, nondistended. Has good bowel sounds. No hepatosplenomegaly appreciated.  EXTREMITIES: No evidence of any cyanosis, clubbing, or peripheral edema.  +2 pedal and radial pulses bilaterally.  NEUROLOGIC: The patient is alert, awake, and oriented x3 with no focal motor or sensory deficits appreciated bilaterally.  SKIN: Moist and warm with no rashes appreciated.  Psych: Not anxious, depressed LN: No inguinal LN enlargement    Antibiotics   Anti-infectives    None      Medications   Scheduled Meds: . acidophilus  1 capsule Oral BID  . aspirin EC  81 mg Oral Daily  . atorvastatin  80 mg Oral Daily  . carvedilol  3.125 mg Oral BID  . enoxaparin (LOVENOX) injection  40 mg Subcutaneous Q24H  . famotidine  20 mg Oral BID  . furosemide  20 mg Intravenous Daily  . gabapentin  200 mg Oral BID  . lamoTRIgine  100 mg Oral QHS  . lamoTRIgine  50 mg Oral BH-q7a  . LORazepam  0.5 mg Oral QHS  . magnesium hydroxide  10 mL Oral Daily  . magnesium oxide  400 mg Oral Daily  . mouth rinse  15 mL Mouth Rinse BID  . metoCLOPramide  5 mg Oral Daily  . polyethylene glycol  17 g Oral Daily  . potassium chloride SA  20 mEq Oral BID  . protein supplement shake  11 oz Oral BID BM  .  QUEtiapine  25 mg Oral BID  . sertraline  150 mg Oral QHS  . sodium chloride flush  3 mL Intravenous Q12H   Continuous Infusions: . sodium chloride     PRN Meds:.sodium chloride, acetaminophen, alum & mag hydroxide-simeth, ondansetron, promethazine, sodium chloride flush   Data Review:   Micro Results Recent Results (from the past 240 hour(s))  Culture, blood (routine x 2)     Status: None (Preliminary result)   Collection Time: 08/15/17 10:52 PM  Result Value Ref Range Status   Specimen Description BLOOD LAC  Final   Special Requests   Final    BOTTLES DRAWN AEROBIC AND ANAEROBIC Blood Culture results may not be optimal due to an excessive volume of blood received in culture bottles   Culture NO GROWTH < 12 HOURS  Final   Report Status PENDING  Incomplete  Culture, blood (routine x 2)     Status: None (Preliminary result)   Collection Time: 08/15/17 10:52 PM  Result Value Ref Range Status   Specimen Description BLOOD RAC  Final   Special Requests BOTTLES DRAWN AEROBIC AND ANAEROBIC BCAV  Final   Culture NO GROWTH < 12 HOURS  Final   Report Status PENDING  Incomplete    Radiology Reports Dg Chest 2 View  Result Date: 08/15/2017 CLINICAL DATA:  Dyspnea starting yesterday and worse today. Cough with thick sputum and fever. EXAM: CHEST  2 VIEW COMPARISON:  06/12/2017 FINDINGS: Stable cardiomegaly mild uncoiling of the thoracic aorta. The patient is status post median sternotomy with aortic valvular replacement. Left-sided pacemaker apparatus with right atrial and right ventricular leads appear stable. Mild diffuse interstitial prominence is again noted with peribronchial thickening consistent chronic bronchitic change. No effusion or pneumothorax. 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 Degenerative changes are  seen along the dorsal spine. IMPRESSION: 1. Stable cardiomegaly with aortic atherosclerosis. 2. Status post CABG and aortic valvular replacement. 3. Mild  diffuse interstitial prominence peribronchial thickening consistent with chronic bronchitic change. Electronically Signed   By: Ashley Royalty M.D.   On: 08/15/2017 21:39     CBC  Recent Labs Lab 08/15/17 2055  WBC 4.7  HGB 11.1*  HCT 33.2*  PLT 224  MCV 82.8  MCH 27.6  MCHC 33.4  RDW 18.9*  LYMPHSABS 1.7  MONOABS 0.2  EOSABS 0.1  BASOSABS 0.0    Chemistries   Recent Labs Lab 08/15/17 2055  NA 141  K 4.0  CL 107  CO2 24  GLUCOSE 119*  BUN 17  CREATININE 0.72  CALCIUM 9.5  AST 23  ALT 13*  ALKPHOS 88  BILITOT 0.6   ------------------------------------------------------------------------------------------------------------------ estimated creatinine clearance is 57.7 mL/min (by C-G formula based on SCr of 0.72 mg/dL). ------------------------------------------------------------------------------------------------------------------ No results for input(s): HGBA1C in the last 72 hours. ------------------------------------------------------------------------------------------------------------------ No results for input(s): CHOL, HDL, LDLCALC, TRIG, CHOLHDL, LDLDIRECT in the last 72 hours. ------------------------------------------------------------------------------------------------------------------  Recent Labs  08/16/17 0158  TSH 1.937   ------------------------------------------------------------------------------------------------------------------ No results for input(s): VITAMINB12, FOLATE, FERRITIN, TIBC, IRON, RETICCTPCT in the last 72 hours.  Coagulation profile No results for input(s): INR, PROTIME in the last 168 hours.  No results for input(s): DDIMER in the last 72 hours.  Cardiac Enzymes  Recent Labs Lab 08/15/17 2055 08/16/17 0158 08/16/17 0841  TROPONINI <0.03 <0.03 <0.03   ------------------------------------------------------------------------------------------------------------------ Invalid input(s): POCBNP    Assessment & Plan    This is a 83 y.o. female with a history of severe aortic stenosis status post valve replacement, paroxysmal atrial fibrillation, pacemaker, chronic diastolic heart failure, coronary artery disease status post CABG, GERD, hypertension, hyperlipidemia, reactive airway disease, incontinence, Type 2 diabetes now being admitted with:  #. Acute exacerbation of congestive heart failure - continue IV Lasix, beta blocker,   - Intake/output, daily weight. - Trend troponins,  - Repeat echo - Cardiology consultation requested  #. History of hyperlipidemia Continue Lipitor  #. History of hypertension/PAF Continue Coreg  #. History of depression - Continue Zoloft  #. History of GERD - Continue Pepcid  #. History of CAD - Continue aspirin  #. History of H/o Diabetes - Accuchecks achs with RISS coverage - Heart healthy, carb controlled diet     Code Status Orders        Start     Ordered   08/16/17 0129  Full code  Continuous     08/16/17 0128    Code Status History    Date Active Date Inactive Code Status Order ID Comments User Context   06/16/2017  1:37 PM 06/26/2017  2:51 AM DNR [id number]  Demetrios Loll, MD Inpatient   06/13/2017  1:30 AM 06/16/2017  1:37 PM Full Code [id number]  Saundra Shelling, MD Inpatient   03/06/2017 12:32 AM 03/09/2017 10:02 PM Full Code [id number]  Max Sane, MD ED   03/05/2017 10:41 PM 03/06/2017 12:32 AM DNR [id number]  Max Sane, MD ED   11/01/2016  8:09 AM 11/06/2016  4:51 PM DNR [id number]  Epifanio Lesches, MD ED   10/12/2016  1:36 AM 10/21/2016  6:48 PM DNR [id number]  Lance Coon, MD Inpatient   09/19/2016 10:52 AM 09/22/2016  8:50 PM DNR [id number]  Knox Royalty, NP Inpatient   09/17/2016  5:57 PM 09/19/2016 10:52 AM Full Code [id number]  Theodoro Grist, MD Inpatient   06/23/2016  8:20 PM 06/26/2016  1:26 PM Full Code [id number]  Gladstone Lighter, MD Inpatient   06/16/2016  8:35 PM 06/21/2016 10:09 PM Full Code [id number]  Lytle Butte, MD ED    03/13/2016  8:15 PM 03/14/2016  9:02 PM Full Code [id number]  Henreitta Leber, MD Inpatient   09/13/2015  6:51 PM 09/18/2015  7:31 PM Full Code [id number]  Dustin Flock, MD Inpatient   10/20/2011  1:40 PM 10/22/2011  1:27 PM Full Code [id number]  Tiajuana Amass, RN Inpatient    Advance Directive Documentation     Most Recent Value  Type of Advance Directive  Healthcare Power of Attorney  Pre-existing out of facility DNR order (yellow form or pink MOST form)  -  "MOST" Form in Place?  -           Consults  cardiology  DVT Prophylaxis  Lovenox  Lab Results  Component Value Date   PLT 224 08/15/2017     Time Spent in minutes   21min  Greater than 50% of time spent in care coordination and counseling patient regarding the condition and plan of care.   Dustin Flock M.D on 08/16/2017 at 2:29 PM  Between 7am to 6pm - Pager - [phone]  After 6pm go to [email or web address] - password [email or web address] [place] [facility]   Office  [phone]

## 2017-08-17 LAB — BASIC METABOLIC PANEL
ANION GAP: 8 (ref 5–15)
BUN: 19 mg/dL (ref 6–20)
CALCIUM: 9.7 mg/dL (ref 8.9–10.3)
CO2: 26 mmol/L (ref 22–32)
Chloride: 106 mmol/L (ref 101–111)
Creatinine, Ser: 0.58 mg/dL (ref 0.44–1.00)
GFR calc non Af Amer: >60 mL/min (ref >60)
Glucose, Bld: 121 mg/dL — ABNORMAL HIGH (ref 65–99)
Potassium: 3.9 mmol/L (ref 3.5–5.1)
Sodium: 140 mmol/L (ref 135–145)

## 2017-08-17 MED ORDER — GABAPENTIN 100 MG PO CAPS: 400.0000 mg | ORAL_CAPSULE | Freq: Two times a day (BID) | ORAL | Status: DC

## 2017-08-17 MED ORDER — LORAZEPAM 0.5 MG PO TABS: 0.5000 mg | ORAL_TABLET | Freq: Every day | ORAL | 0 refills | Status: AC

## 2017-08-17 NOTE — Care Management (Signed)
CM consult for heart failure screen.  Patient is currently followed by [facility] for heart failure.  She presented from [facility].  She has DNR in place.  It is anticipated patient will return to [facility] with resumption of a hospice plan of care

## 2017-08-17 NOTE — Clinical Social Work Note (Signed)
Clinical Social Work Assessment  Patient Details  Name: Michele Schultz MRN: [id number] Date of Birth: May 21, 1931  Date of referral:  08/17/17               Reason for consult:  Facility Placement                Permission sought to share information with:  Family Supports Permission granted to share information::  Yes, Verbal Permission Granted  Name::     Knight,Shelia Niece [phone]  [phone] or Delrae Sawyers [phone] or Kendrick,Phyllis A Niece [phone] or   Agency::  [facility]  Relationship::     Contact Information:     Housing/Transportation Living arrangements for the past 2 months:  [place] of Information:  Patient Patient Interpreter Needed:  None Criminal Activity/Legal Involvement Pertinent to Current Situation/Hospitalization:  No - Comment as needed Significant Relationships:  Other Family Members Lives with:  Facility Resident Do you feel safe going back to the place where you live?  Yes Need for family participation in patient care:  No (Coment)  Care giving concerns:  Patient does not express any concerns about returning back to SNF.   Social Worker assessment / plan:  Patient is a 83 year old female who is alert and oriented x4.  Patient is a long term care resident at [facility], patient has been there for several months.  Patient expressed that she still would rather go home, but she is aware that [facility] is the best option for her to return back to facility.  Patient was informed role of CSW and process for getting her back to SNF.  Patient is in agreement to having CSW send information back to SNF.  Employment status:  Retired Forensic scientist:  Other (Comment Required) ([facility]) PT Recommendations:  Not assessed at this time Information / Referral to community resources:  [place]  Patient/Family's Response to care:  Patient in agreement to returning  back to SNF.  Patient/Family's Understanding of and Emotional Response to Diagnosis, Current Treatment, and Prognosis:  Patient expressed gratitude for CSW visiting her, she stated she was looking forward to getting back to her routine at SNF.  Emotional Assessment Appearance:  Appears stated age Attitude/Demeanor/Rapport:    Affect (typically observed):  Calm, Appropriate Orientation:  Oriented to Self, Oriented to Place, Oriented to  Time, Oriented to Situation Alcohol / Substance use:  Not Applicable Psych involvement (Current and /or in the community):  No (Comment)  Discharge Needs  Concerns to be addressed:  Care Coordination Readmission within the last 30 days:  No Current discharge risk:  None Barriers to Discharge:  No Barriers Identified   Ross Ludwig, LCSWA 08/17/2017, 3:18 PM

## 2017-08-17 NOTE — Discharge Summary (Signed)
[facility] - [place] at [facility], 83 y.o., DOB 1931-10-16, MRN [id number]. Admission date: 08/15/2017 Discharge Date 08/17/2017 Primary MD [name], MD Admitting Physician Harvie Bridge, DO  Admission Diagnosis  Hypoxia [R09.02] Congestive heart failure, unspecified HF chronicity, unspecified heart failure type ([place]) [I50.9]  Discharge Diagnosis   Active Problems:   Acute diastolic CHF (congestive heart failure) ([place])   Hyperlipidemia   Essential hypertension  Depression GERD Coronary artery disease Diabetes type 2 Diabetic neuropathy Paroxysmal atrial fibrillation status post pacemaker Coronary artery disease status post CABG GERD     Hospital Course  Wannetta Stanek is a 83 y.o. female with a known history of severe aortic stenosis status post valve replacement, paroxysmal atrial fibrillation, pacemaker, chronic diastolic heart failure, coronary artery disease status post CABG, GERD, hypertension, hyperlipidemia, reactive airway disease, incontinence, Type 2 diabetes presents to the emergency department for evaluation of shortness of breath. Patient is currently being followed by hospice and has when necessary oxygen however was not given oxygen at the facility. In hospital this was not notified of patient's breathing trouble and she was sent to the hospital for further evaluation. Patient was noted to have acute diastolic CHF. She was treated with IV Lasix currently she is back to baseline. And will return back to the skilled nursing facility. She will have hospice follow her there. Facility needs to notify hospice prior to patient being brought to the emergency room.            Consults  None  Significant Tests:  See full reports for all details     Dg Chest 2 View  Result Date: 08/15/2017 CLINICAL DATA:  Dyspnea starting yesterday and worse today. Cough with thick sputum and fever. EXAM: CHEST  2 VIEW COMPARISON:   06/12/2017 FINDINGS: Stable cardiomegaly mild uncoiling of the thoracic aorta. The patient is status post median sternotomy with aortic valvular replacement. Left-sided pacemaker apparatus with right atrial and right ventricular leads appear stable. Mild diffuse interstitial prominence is again noted with peribronchial thickening consistent chronic bronchitic change. No effusion or pneumothorax. Osteoarthritis of the [facility] and glenohumeral joints bilaterally. Degenerative changes are seen along the dorsal spine. IMPRESSION: 1. Stable cardiomegaly with aortic atherosclerosis. 2. Status post CABG and aortic valvular replacement. 3. Mild diffuse interstitial prominence peribronchial thickening consistent with chronic bronchitic change. Electronically Signed   By: Ashley Royalty M.D.   On: 08/15/2017 21:39       Today   Subjective:   [place] patient complains of numbness in her feet otherwise denies any other symptoms  Objective:   Blood pressure 122/69, pulse (!) 106, temperature 98.4 F (36.9 C), temperature source Oral, resp. rate 16, height 5\' 4"  (1.626 m), weight 204 lb 6.4 oz (92.7 kg), SpO2 98 %.  .  Intake/Output Summary (Last 24 hours) at 08/17/17 1105 Last data filed at 08/17/17 1013  Gross per 24 hour  Intake             1080 ml  Output             1075 ml  Net                5 ml    Exam VITAL SIGNS: Blood pressure 122/69, pulse (!) 106, temperature 98.4 F (36.9 C), temperature source Oral, resp. rate 16, height 5\' 4"  (1.626 m), weight 204 lb 6.4 oz (92.7 kg), SpO2 98 %.  GENERAL:  83 y.o.-year-old patient lying in the bed  with no acute distress.  EYES: Pupils equal, round, reactive to light and accommodation. No scleral icterus. Extraocular muscles intact.  HEENT: Head atraumatic, normocephalic. Oropharynx and nasopharynx clear.  NECK:  Supple, no jugular venous distention. No thyroid enlargement, no tenderness.  LUNGS: Normal breath sounds bilaterally, no wheezing,  rales,rhonchi or crepitation. No use of accessory muscles of respiration.  CARDIOVASCULAR: S1, S2 normal. No murmurs, rubs, or gallops.  ABDOMEN: Soft, nontender, nondistended. Bowel sounds present. No organomegaly or mass.  EXTREMITIES: No pedal edema, cyanosis, or clubbing.  NEUROLOGIC: Cranial nerves II through XII are intact. Muscle strength 5/5 in all extremities. Sensation intact. Gait not checked.  PSYCHIATRIC: The patient is alert and oriented x 3.  SKIN: No obvious rash, lesion, or ulcer.   Data Review     CBC w Diff:  Lab Results  Component Value Date   WBC 4.7 08/15/2017   HGB 11.1 (L) 08/15/2017   HGB 11.3 08/25/2015   HCT 33.2 (L) 08/15/2017   HCT 36.4 08/25/2015   PLT 224 08/15/2017   PLT 304 08/25/2015   LYMPHOPCT 35 08/15/2017   LYMPHOPCT 18.0 10/16/2014   MONOPCT 3 08/15/2017   MONOPCT 7 10/21/2014   MONOPCT 8.7 10/16/2014   EOSPCT 2 08/15/2017   EOSPCT 1.0 10/16/2014   BASOPCT 1 08/15/2017   BASOPCT 0.6 10/16/2014   CMP:  Lab Results  Component Value Date   NA 140 08/17/2017   NA 142 02/12/2017   NA 140 12/04/2014   K 3.9 08/17/2017   K 3.3 (L) 12/04/2014   CL 106 08/17/2017   CL 104 12/04/2014   CO2 26 08/17/2017   CO2 29 12/04/2014   BUN 19 08/17/2017   BUN 8 02/12/2017   BUN 8 12/04/2014   CREATININE 0.58 08/17/2017   CREATININE 0.82 12/04/2014   GLU 130 02/12/2017   PROT 7.4 08/15/2017   PROT 7.9 12/04/2014   ALBUMIN 3.7 08/15/2017   ALBUMIN 3.5 12/04/2014   BILITOT 0.6 08/15/2017   BILITOT 0.8 12/04/2014   ALKPHOS 88 08/15/2017   ALKPHOS 92 12/04/2014   AST 23 08/15/2017   AST 26 12/04/2014   ALT 13 (L) 08/15/2017   ALT 18 12/04/2014  .  Micro Results Recent Results (from the past 240 hour(s))  Culture, blood (routine x 2)     Status: None (Preliminary result)   Collection Time: 08/15/17 10:52 PM  Result Value Ref Range Status   Specimen Description BLOOD LAC  Final   Special Requests   Final    BOTTLES DRAWN AEROBIC AND  ANAEROBIC Blood Culture results may not be optimal due to an excessive volume of blood received in culture bottles   Culture NO GROWTH 2 DAYS  Final   Report Status PENDING  Incomplete  Culture, blood (routine x 2)     Status: None (Preliminary result)   Collection Time: 08/15/17 10:52 PM  Result Value Ref Range Status   Specimen Description BLOOD RAC  Final   Special Requests BOTTLES DRAWN AEROBIC AND ANAEROBIC BCAV  Final   Culture NO GROWTH 2 DAYS  Final   Report Status PENDING  Incomplete        Code Status Orders        Start     Ordered   08/16/17 1506  Do not attempt resuscitation (DNR)  Continuous    Question Answer Comment  In the event of cardiac or respiratory ARREST Do not call a "code blue"   In the event of cardiac or  respiratory ARREST Do not perform Intubation, CPR, defibrillation or ACLS   In the event of cardiac or respiratory ARREST Use medication by any route, position, wound care, and other measures to relive pain and suffering. May use oxygen, suction and manual treatment of airway obstruction as needed for comfort.      08/16/17 1505    Code Status History    Date Active Date Inactive Code Status Order ID Comments User Context   08/16/2017  1:28 AM 08/16/2017  3:05 PM Full Code [id number]  Hugelmeyer, Ubaldo Glassing, DO ED   06/16/2017  1:37 PM 06/26/2017  2:51 AM DNR [id number]  Demetrios Loll, MD Inpatient   06/13/2017  1:30 AM 06/16/2017  1:37 PM Full Code [id number]  Saundra Shelling, MD Inpatient   03/06/2017 12:32 AM 03/09/2017 10:02 PM Full Code [id number]  Max Sane, MD ED   03/05/2017 10:41 PM 03/06/2017 12:32 AM DNR [id number]  Max Sane, MD ED   11/01/2016  8:09 AM 11/06/2016  4:51 PM DNR [id number]  Epifanio Lesches, MD ED   10/12/2016  1:36 AM 10/21/2016  6:48 PM DNR [id number]  Lance Coon, MD Inpatient   09/19/2016 10:52 AM 09/22/2016  8:50 PM DNR [id number]  Knox Royalty, NP Inpatient   09/17/2016  5:57 PM 09/19/2016 10:52 AM Full Code [id number]  Theodoro Grist, MD  Inpatient   06/23/2016  8:20 PM 06/26/2016  1:26 PM Full Code [id number]  Gladstone Lighter, MD Inpatient   06/16/2016  8:35 PM 06/21/2016 10:09 PM Full Code [id number]  Lytle Butte, MD ED   03/13/2016  8:15 PM 03/14/2016  9:02 PM Full Code [id number]  Henreitta Leber, MD Inpatient   09/13/2015  6:51 PM 09/18/2015  7:31 PM Full Code [id number]  Dustin Flock, MD Inpatient   10/20/2011  1:40 PM 10/22/2011  1:27 PM Full Code [id number]  Tiajuana Amass, RN Inpatient    Advance Directive Documentation     Most Recent Value  Type of Advance Directive  Healthcare Power of [place]  Pre-existing out of facility DNR order (yellow form or pink MOST form)  -  "MOST" Form in Place?  -          Follow-up Information    [place]. Go on 08/23/2017.   Specialty:  Cardiology Why:  at 10:00am Contact information: [place] [phone]          Discharge Medications   Allergies as of 08/17/2017      Reactions   Citalopram Other (See Comments)   Reaction:  Altered mental status    Cymbalta [duloxetine Hcl] Other (See Comments)   Reaction:  Sedative for pt    Imipramine Other (See Comments)   Reaction:  Unknown    Proton Pump Inhibitors Other (See Comments)   Reaction:  Unknown    Venlafaxine Nausea And Vomiting, Other (See Comments)   Reaction:  Dizziness       Medication List    TAKE these medications   acetaminophen 650 MG CR tablet Commonly known as:  TYLENOL Take 650 mg by mouth 3 (three) times daily.   acidophilus Caps capsule Take 1 capsule by mouth 2 (two) times daily.   aluminum-magnesium hydroxide-simethicone 829-937-16 MG/5ML Susp Commonly known as:  MAALOX Take 30 mLs by mouth every 4 (four) hours as needed.   aspirin EC 81 MG tablet Take 81 mg by mouth daily.   atorvastatin 80 MG tablet Commonly known as:  LIPITOR Take 1 tablet (80 mg total) by mouth daily.    carvedilol 3.125 MG tablet Commonly known as:  COREG Take 1 tablet (3.125 mg total) by mouth 2 (two) times daily.   famotidine 20 MG tablet Commonly known as:  PEPCID Take 1 tablet (20 mg total) by mouth 2 (two) times daily.   furosemide 20 MG tablet Commonly known as:  LASIX Take 1 tablet (20 mg total) by mouth 2 (two) times daily.   gabapentin 100 MG capsule Commonly known as:  NEURONTIN Take 4 capsules (400 mg total) by mouth 2 (two) times daily. What changed:  how much to take   lamoTRIgine 100 MG tablet Commonly known as:  LAMICTAL Take 100 mg by mouth at bedtime.   lamoTRIgine 25 MG tablet Commonly known as:  LAMICTAL Take 50 mg by mouth every morning.   LORazepam 0.5 MG tablet Commonly known as:  ATIVAN Take 1 tablet (0.5 mg total) by mouth at bedtime.   magnesium hydroxide 400 MG/5ML suspension Commonly known as:  MILK OF MAGNESIA Take 10 mLs by mouth daily.   magnesium oxide 400 MG tablet Commonly known as:  MAG-OX Take 1 tablet (400 mg total) by mouth daily.   metFORMIN 500 MG tablet Commonly known as:  GLUCOPHAGE Take 500 mg by mouth 2 (two) times daily with a meal.   metoCLOPramide 5 MG tablet Commonly known as:  REGLAN Take 1 tablet (5 mg total) by mouth 4 (four) times daily -  before meals and at bedtime. What changed:  when to take this   ondansetron 4 MG tablet Commonly known as:  ZOFRAN Take 4 mg by mouth every 6 (six) hours as needed for nausea or vomiting.   polyethylene glycol powder powder Commonly known as:  GLYCOLAX/MIRALAX Take 255 g by mouth daily. Mix entire contents of miralax (255g) with 64 oz of gatorade in a pitcher.   Drink 8oz of mixture every 30 minutes until you have multiple loose bowel movements.   Potassium Chloride ER 20 MEQ Tbcr Take 20 mEq by mouth 2 (two) times daily.   promethazine 12.5 MG tablet Commonly known as:  PHENERGAN Take 1 tablet (12.5 mg total) by mouth every 6 (six) hours as needed for nausea. What  changed:  when to take this   protein supplement shake Liqd Commonly known as:  PREMIER PROTEIN Take 325 mLs (11 oz total) by mouth 2 (two) times daily between meals.   QUEtiapine 25 MG tablet Commonly known as:  SEROQUEL Take 25 mg by mouth 2 (two) times daily.   sertraline 50 MG tablet Commonly known as:  ZOLOFT Take 3 tablets (150 mg total) by mouth at bedtime.            Discharge Care Instructions        Start     Ordered   08/17/17 0000  LORazepam (ATIVAN) 0.5 MG tablet  Daily at bedtime     08/17/17 1105   08/17/17 0000  gabapentin (NEURONTIN) 100 MG capsule  2 times daily     08/17/17 1105         Total Time in preparing paper work, data evaluation and todays exam - 35 minutes  Dustin Flock M.D on 08/17/2017 at 11:05 AM  [place]  [phone]

## 2017-08-17 NOTE — NC FL2 (Signed)
[place] LEVEL OF CARE SCREENING TOOL     IDENTIFICATION  Patient Name: Michele Schultz Birthdate: 1931/02/04 Sex: female Admission Date (Current Location): 08/15/2017  [place] and [place] Number:  Engineering geologist and Address:  [facility], [address], [place], [place] [address]      Provider Number: [id number]  Attending Physician Name and Address:  Dustin Flock, MD  Relative Name and Phone Number:  Margo Aye Niece [phone]      Current Level of Care:   Recommended Level of Care: [place] Prior Approval Number:    Date Approved/Denied:   PASRR Number: [id number]  Discharge Plan: SNF    Current Diagnoses: Patient Active Problem List   Diagnosis Date Noted  . Acute diastolic CHF (congestive heart failure) ([place]) 08/16/2017  . Hypotension   . Goals of care, counseling/discussion   . Ischemic cardiomyopathy 06/16/2017  . Acute combined systolic and diastolic CHF, NYHA class 3 ([place]) 06/12/2017  . Nausea vomiting and diarrhea 03/05/2017  . Adjustment disorder with depressed mood 11/02/2016  . Nausea & vomiting 11/01/2016  . Nausea and vomiting   . Atrial fibrillation with RVR ([place]) 10/11/2016  . Hypokalemia 10/11/2016  . DNR (do not resuscitate) 09/19/2016  . Palliative care by specialist 09/19/2016  . Muscle weakness (generalized)   . Atrial tachycardia ([place])   . Diarrhea 09/17/2016  . Dehydration 09/17/2016  . Metabolic acidosis 70/35/0093  . Headache due to trauma 07/03/2016  . Wide-complex tachycardia ([place])   . Pain in the chest   . Emesis   . Weakness of both legs   . Coronary artery disease involving coronary bypass graft of native heart with angina pectoris ([place])   . S/P AVR (aortic valve replacement)   . Falls frequently   . HLD (hyperlipidemia)   . Fibromyalgia   . RAD (reactive airway disease)   . Morbid obesity ([place])   . Aortic stenosis - s/p AVR   . Chronic pain  06/17/2014  . Pain in the muscles 06/19/2013  . Chest pain 06/19/2013  . Wheezing 06/19/2013  . Weakness generalized 06/03/2013  . Dyspnea 01/27/2013  . Nausea 03/05/2012  . Constipation 03/05/2012  . Atrial fibrillation-non sustained 02/09/2012  . Cardiac pacemaker -st [place]   . Chronic diastolic heart failure ([place])   . Coronary artery disease   . Long term (current) use of anticoagulants 01/03/2012  . S/P CABG x 2 11/14/2011  . S/P aortic valve replacement with porcine valve 11/14/2011  . Back pain 11/14/2011  . Complete heart block ([place]) 10/20/2011  . Hypertensive heart disease with CHF (congestive heart failure) ([place]) 09/15/2011  . Spinal stenosis 03/22/2011  . Fatigue 03/22/2011  . Hyperlipidemia 03/22/2011    Orientation RESPIRATION BLADDER Height & Weight     Self, Time, Situation, Place  Normal Continent Weight: 204 lb 6.4 oz (92.7 kg) Height:  5\' 4"  (162.6 cm)  BEHAVIORAL SYMPTOMS/MOOD NEUROLOGICAL BOWEL NUTRITION STATUS      Continent Diet (Cardiac diet)  AMBULATORY STATUS COMMUNICATION OF NEEDS Skin   Limited Assist Verbally Normal                       Personal Care Assistance Level of Assistance  Bathing, Feeding, Dressing Bathing Assistance: Limited assistance Feeding assistance: Independent Dressing Assistance: Limited assistance     Functional Limitations Info  Sight, Hearing, Speech Sight Info: Adequate Hearing Info: Adequate Speech Info: Adequate    SPECIAL CARE FACTORS FREQUENCY  Contractures Contractures Info: Not present    Additional Factors Info  Code Status, Allergies, Psychotropic Code Status Info: DNR Allergies Info: CITALOPRAM, CYMBALTA DULOXETINE HCL, IMIPRAMINE, PROTON PUMP INHIBITORS, VENLAFAXINE  Psychotropic Info: LORazepam (ATIVAN) tablet 0.5 mg and QUEtiapine (SEROQUEL) tablet 25 mg and sertraline (ZOLOFT) tablet 150 mg         Current Medications (08/17/2017):  This is the current hospital  active medication list Current Facility-Administered Medications  Medication Dose Route Frequency Provider Last Rate Last Dose  . 0.9 %  sodium chloride infusion  250 mL Intravenous PRN Hugelmeyer, Alexis, DO      . acetaminophen (TYLENOL) tablet 650 mg  650 mg Oral Q4H PRN Hugelmeyer, Alexis, DO   650 mg at 08/17/17 0548  . acidophilus (RISAQUAD) capsule 1 capsule  1 capsule Oral BID Hugelmeyer, Alexis, DO   1 capsule at 08/17/17 0914  . alum & mag hydroxide-simeth (MAALOX/MYLANTA) 200-200-20 MG/5ML suspension 30 mL  30 mL Oral Q4H PRN Hugelmeyer, Alexis, DO      . aspirin EC tablet 81 mg  81 mg Oral Daily Hugelmeyer, Alexis, DO   81 mg at 08/17/17 0914  . atorvastatin (LIPITOR) tablet 80 mg  80 mg Oral Daily Hugelmeyer, Alexis, DO   80 mg at 08/16/17 1800  . carvedilol (COREG) tablet 3.125 mg  3.125 mg Oral BID Hugelmeyer, Alexis, DO   3.125 mg at 08/17/17 0914  . enoxaparin (LOVENOX) injection 40 mg  40 mg Subcutaneous Q24H Hugelmeyer, Alexis, DO   40 mg at 08/16/17 0236  . famotidine (PEPCID) tablet 20 mg  20 mg Oral BID Hugelmeyer, Alexis, DO   20 mg at 08/17/17 0914  . furosemide (LASIX) injection 20 mg  20 mg Intravenous Daily Hugelmeyer, Alexis, DO   20 mg at 08/17/17 0913  . gabapentin (NEURONTIN) capsule 200 mg  200 mg Oral BID Hugelmeyer, Alexis, DO   200 mg at 08/17/17 0914  . lamoTRIgine (LAMICTAL) tablet 100 mg  100 mg Oral QHS Hugelmeyer, Alexis, DO   100 mg at 08/16/17 2214  . lamoTRIgine (LAMICTAL) tablet 50 mg  50 mg Oral BH-q7a Hugelmeyer, Alexis, DO   50 mg at 08/17/17 0913  . LORazepam (ATIVAN) tablet 0.5 mg  0.5 mg Oral QHS Hugelmeyer, Alexis, DO   0.5 mg at 08/16/17 2214  . magnesium hydroxide (MILK OF MAGNESIA) suspension 10 mL  10 mL Oral Daily Hugelmeyer, Alexis, DO   10 mL at 08/17/17 0914  . magnesium oxide (MAG-OX) tablet 400 mg  400 mg Oral Daily Hugelmeyer, Alexis, DO   400 mg at 08/17/17 0914  . MEDLINE mouth rinse  15 mL Mouth Rinse BID Hugelmeyer, Alexis, DO   15  mL at 08/17/17 0915  . metoCLOPramide (REGLAN) tablet 5 mg  5 mg Oral Daily Hugelmeyer, Alexis, DO   5 mg at 08/17/17 0914  . ondansetron (ZOFRAN) tablet 4 mg  4 mg Oral Q6H PRN Hugelmeyer, Alexis, DO      . polyethylene glycol (MIRALAX / GLYCOLAX) packet 17 g  17 g Oral Daily Hugelmeyer, Alexis, DO   17 g at 08/17/17 0914  . potassium chloride SA (K-DUR,KLOR-CON) CR tablet 20 mEq  20 mEq Oral BID Hugelmeyer, Alexis, DO   20 mEq at 08/17/17 0915  . promethazine (PHENERGAN) tablet 12.5 mg  12.5 mg Oral Q4H PRN Hugelmeyer, Alexis, DO      . protein supplement (PREMIER PROTEIN) liquid  11 oz Oral BID BM Hugelmeyer, Alexis, DO   11 oz at 08/17/17 1000  .  QUEtiapine (SEROQUEL) tablet 25 mg  25 mg Oral BID Hugelmeyer, Alexis, DO   25 mg at 08/17/17 0914  . sertraline (ZOLOFT) tablet 150 mg  150 mg Oral QHS Hugelmeyer, Alexis, DO   150 mg at 08/16/17 2214  . sodium chloride flush (NS) 0.9 % injection 3 mL  3 mL Intravenous Q12H Hugelmeyer, Alexis, DO   3 mL at 08/17/17 0915  . sodium chloride flush (NS) 0.9 % injection 3 mL  3 mL Intravenous PRN Hugelmeyer, Alexis, DO         Discharge Medications: Please see discharge summary for a list of discharge medications.  Relevant Imaging Results:  Relevant Lab Results:   Additional Information SSN [id number]  Ross Ludwig, [place]

## 2017-08-17 NOTE — Care Management Important Message (Signed)
Important Message  Patient Details  Name: JEYMI HEPP MRN: [id number] Date of Birth: Jun 24, 1931   Medicare Important Message Given:  Yes Signed IM notice given   Katrina Stack, RN 08/17/2017, 2:27 PM

## 2017-08-17 NOTE — Progress Notes (Signed)
Visit made. Patient alert and interactive, care giver Leda Gauze at bedside. Patient on room air, but when discussing discharge patient became anxious and stated she felt short of breath. Oxygen applied by staff RN [place]. Attending physician Dr. Posey Pronto in during visit, patient complained of her feet "burning and keeping her awake". She is currently on Neurontin 200 mg bid. Dr. Posey Pronto discussed increasing medication at discharge. Per chart note review Neurontin increased to 400 mg bid. Patient resting comfortably at end of visit. Plan is for discharge back to [facility] today. She will require EMS transport. Signed DNR in place in patient's chart. Hospice team updated. Thank yhou. Flo Shanks RN, BSN, [facility] [place], hospital Liaison [phone] c

## 2017-08-17 NOTE — Discharge Instructions (Addendum)
[place] at [place]:  Cardiac diet , diabetic diet  DISCHARGE CONDITION:  Fair  ACTIVITY:  Activity as tolerated  OXYGEN:  Home Oxygen: Yes.     Oxygen Delivery: 2 liters/min via Patient connected to nasal cannula oxygen prn  DISCHARGE LOCATION:  home    ADDITIONAL DISCHARGE INSTRUCTION:continue hospices  services   If you experience worsening of your admission symptoms, develop shortness of breath, life threatening emergency, suicidal or homicidal thoughts you must seek medical attention immediately by calling 911 or calling your MD immediately  if symptoms less severe.  You Must read complete instructions/literature along with all the possible adverse reactions/side effects for all the Medicines you take and that have been prescribed to you. Take any new Medicines after you have completely understood and accpet all the possible adverse reactions/side effects.   Please note  You were cared for by a hospitalist during your hospital stay. If you have any questions about your discharge medications or the care you received while you were in the hospital after you are discharged, you can call the unit and asked to speak with the hospitalist on call if the hospitalist that took care of you is not available. Once you are discharged, your primary care physician will handle any further medical issues. Please note that NO REFILLS for any discharge medications will be authorized once you are discharged, as it is imperative that you return to your primary care physician (or establish a relationship with a primary care physician if you do not have one) for your aftercare needs so that they can reassess your need for medications and monitor your lab values.

## 2017-08-17 NOTE — Progress Notes (Signed)
IV and tele removed from patient. Patient prepared for transportation and [facility] called for hand off report. Niece Adela Lank also contacted and updated on patient. No distress at this time. EMS has been contacted and will be transporting patient.

## 2017-08-17 NOTE — Clinical Social Work Note (Addendum)
Patient to be d/c'ed today to [facility].  Patient and family agreeable to plans will transport via ems RN to call report [phone] with hospice to continue to follow patient at SNF.  CSW updated patient's niece Adela Lank, [phone].  Evette Cristal, MSW, [place]

## 2017-08-20 LAB — CULTURE, BLOOD (ROUTINE X 2)
CULTURE: NO GROWTH
CULTURE: NO GROWTH

## 2017-08-23 ENCOUNTER — Encounter: Payer: Self-pay | Admitting: Family

## 2017-08-23 ENCOUNTER — Ambulatory Visit: Attending: Family | Admitting: Family

## 2017-08-23 VITALS — BP 100/77 | HR 107 | Resp 18 | Ht 64.0 in | Wt 204.0 lb

## 2017-08-23 DIAGNOSIS — Z951 Presence of aortocoronary bypass graft: Secondary | ICD-10-CM | POA: Insufficient documentation

## 2017-08-23 DIAGNOSIS — Z9889 Other specified postprocedural states: Secondary | ICD-10-CM | POA: Insufficient documentation

## 2017-08-23 DIAGNOSIS — I2510 Atherosclerotic heart disease of native coronary artery without angina pectoris: Secondary | ICD-10-CM | POA: Insufficient documentation

## 2017-08-23 DIAGNOSIS — Z96649 Presence of unspecified artificial hip joint: Secondary | ICD-10-CM | POA: Insufficient documentation

## 2017-08-23 DIAGNOSIS — K219 Gastro-esophageal reflux disease without esophagitis: Secondary | ICD-10-CM | POA: Diagnosis not present

## 2017-08-23 DIAGNOSIS — Z7984 Long term (current) use of oral hypoglycemic drugs: Secondary | ICD-10-CM | POA: Insufficient documentation

## 2017-08-23 DIAGNOSIS — Z833 Family history of diabetes mellitus: Secondary | ICD-10-CM | POA: Diagnosis not present

## 2017-08-23 DIAGNOSIS — Z8249 Family history of ischemic heart disease and other diseases of the circulatory system: Secondary | ICD-10-CM | POA: Insufficient documentation

## 2017-08-23 DIAGNOSIS — I10 Essential (primary) hypertension: Secondary | ICD-10-CM | POA: Insufficient documentation

## 2017-08-23 DIAGNOSIS — I5022 Chronic systolic (congestive) heart failure: Secondary | ICD-10-CM | POA: Insufficient documentation

## 2017-08-23 DIAGNOSIS — E1140 Type 2 diabetes mellitus with diabetic neuropathy, unspecified: Secondary | ICD-10-CM | POA: Diagnosis not present

## 2017-08-23 DIAGNOSIS — I480 Paroxysmal atrial fibrillation: Secondary | ICD-10-CM | POA: Insufficient documentation

## 2017-08-23 DIAGNOSIS — Z79899 Other long term (current) drug therapy: Secondary | ICD-10-CM | POA: Diagnosis not present

## 2017-08-23 DIAGNOSIS — F329 Major depressive disorder, single episode, unspecified: Secondary | ICD-10-CM | POA: Insufficient documentation

## 2017-08-23 DIAGNOSIS — D649 Anemia, unspecified: Secondary | ICD-10-CM | POA: Insufficient documentation

## 2017-08-23 DIAGNOSIS — Z952 Presence of prosthetic heart valve: Secondary | ICD-10-CM | POA: Diagnosis not present

## 2017-08-23 DIAGNOSIS — M797 Fibromyalgia: Secondary | ICD-10-CM | POA: Insufficient documentation

## 2017-08-23 DIAGNOSIS — I350 Nonrheumatic aortic (valve) stenosis: Secondary | ICD-10-CM | POA: Insufficient documentation

## 2017-08-23 DIAGNOSIS — I509 Heart failure, unspecified: Secondary | ICD-10-CM | POA: Insufficient documentation

## 2017-08-23 DIAGNOSIS — E119 Type 2 diabetes mellitus without complications: Secondary | ICD-10-CM | POA: Insufficient documentation

## 2017-08-23 DIAGNOSIS — Z950 Presence of cardiac pacemaker: Secondary | ICD-10-CM | POA: Insufficient documentation

## 2017-08-23 DIAGNOSIS — I110 Hypertensive heart disease with heart failure: Secondary | ICD-10-CM | POA: Insufficient documentation

## 2017-08-23 DIAGNOSIS — Z888 Allergy status to other drugs, medicaments and biological substances status: Secondary | ICD-10-CM | POA: Insufficient documentation

## 2017-08-23 DIAGNOSIS — E785 Hyperlipidemia, unspecified: Secondary | ICD-10-CM | POA: Insufficient documentation

## 2017-08-23 LAB — GLUCOSE, CAPILLARY: Glucose-Capillary: 98 mg/dL (ref 65–99)

## 2017-08-23 NOTE — Progress Notes (Signed)
Agree with pharmacist note below.  Physical exam and ROS were done by myself.  Not being weighed daily so an order was placed to be weighed daily and to call for an overnight weight gain of >2 pounds or a weekly weight gain of >5 pounds.  Does not add salt to her food and the importance of eating low sodium foods was discussed with her.   Note made for PCP at the facility to follow-up in regards to her burning sensation that she feels in her legs/feet.  Return here in 1 month or sooner for any questions/problems before then. Consider adding entresto or increasing carvedilol.   Darylene Price, FNP HF Clinic at [facility]    Patient ID: Michele Schultz, female    DOB: 1931-07-29, 83 y.o.   MRN: [id number]  HPI  Michele Schultz is an 83 year old women with a past medical history of HLD, HTN, depression, GERD, CAD s/p CABG, T2DM, diabetic neuropathy, paroxysmal Afib s/p pacemaker, GERD, aortic stenosis, anemia, fibromyalgia and chronic heart failure.   Most recent Echo on 06/13/17 showed LV EF 20-25% along with trivial AR, severe MR and moderate TR. This has declined from previous EF of 55-60% on 07/23/15. Cardiac catheterization was done October 2012.  Most recent hospital admission on 08/15/17 due to shortness of breath. Was started on IV lasix and continued on her home medications during admission. Cardiology consult was obtained. Returned to baseline and discharged (08/17/17) back to skilled nursing facility where hospice continues to follow her. Admitted 06/12/17 due to HF exacerbation along with aortic stenosis s/p AVR. Cardiology consult obtained. Initially needed IV diuretics and then transitioned to oral diuretics. Discharged to SNF after 13 days.  Patient presents for initial visit to [facility] HF clinic with chief complaint of burning in feet/legs that is interfering with her sleep. Denies any swelling or recent chest pain. Has chronic SOB and weakness. Patient says she is not weighed daily at SNF,  but weight is down ~40 pounds since February.   Past Medical History:  Diagnosis Date  . Anemia   . Aortic stenosis, severe    a. s/p Magna Ease pericardial tissue valve size 21 mm replacement in 10/2011 for severe AS 11/12; b. echo 07/2015: EF 55-60% mod concentric LVH, GR1DD, LA mildly dilated, PASP 45 mm Hg  . Atrial tachycardia, paroxysmal (HCC)    a. with rate related LBBB.  . Cardiac pacemaker -st Judes    11/12  . Chronic diastolic heart failure ([place])    a. echo 2014: EF 55-60%, no RWMA, GR1DD, PASP 47 mm Hg; b. echo 07/2015: EF 55-60% mod concentric LVH, GR1DD, LA mildly dilated, PASP 45 mm Hg  . Complete heart block [facility])    a. s/p St Jude PPM 10/2011 (Ser # [id number]).  . Coronary artery disease    a. s/p 2v CABG 11/12 (VG-LAD, VG-OM1); b. Lexiscan 08/2015: low risk, no ischemia, EF 55-65%.  . Enthesopathy of hip region   . Esophageal reflux    followed by Dr.Seigal. stabilized with a combination of Nexium and Zantac  . Fibromyalgia   . HLD (hyperlipidemia)   . Hypertensive heart disease   . Knee joint replacement by other means   . Morbid obesity ([place])   . Neuralgia, neuritis, and radiculitis, unspecified   . Neuropathy   . Onychia and paronychia of toe   . Osteoarthrosis, unspecified whether generalized or localized, pelvic region and thigh    mainly in her back and knees  . PAF (  paroxysmal atrial fibrillation) (HCC)    a. brief episodes of AF previously noted on device interrogations.  Marland Kitchen RAD (reactive airway disease)    a. chronic SOB  . Spinal stenosis   . Stress incontinence, female    followed by Dr.Cope  . Type II or unspecified type diabetes mellitus without mention of complication, not stated as uncontrolled    a. pt. reports that she is borderline   . Wide-complex tachycardia ([place])    a. Noted 4/10 - brief episode in ED. Noted again 4/21 in clinic->felt most likely to be atrial tach.   Past Surgical History:  Procedure Laterality Date  . AORTIC VALVE  REPLACEMENT  10/17/2011   Procedure: AORTIC VALVE REPLACEMENT (AVR);  Surgeon: Melrose Nakayama, MD;  Location: [place];  Service: Open Heart Surgery;  Laterality: N/A;  . CARDIAC CATHETERIZATION  08/2009   50% stenosis distal left main, 50% stenosis ostial left circumflex.   Marland Kitchen CARDIAC CATHETERIZATION     at [facility]  . CORONARY ARTERY BYPASS GRAFT  10/17/2011   Procedure: CORONARY ARTERY BYPASS GRAFTING (CABG);  Surgeon: Melrose Nakayama, MD;  Location: [place];  Service: Open Heart Surgery;  Laterality: N/A;  Times Two, using right leg greater saphenous vein harvested endoscopically  . ESOPHAGOGASTRODUODENOSCOPY (EGD) WITH PROPOFOL N/A 10/12/2016   Procedure: ESOPHAGOGASTRODUODENOSCOPY (EGD) WITH PROPOFOL;  Surgeon: Lucilla Lame, MD;  Location: [facility] ENDOSCOPY;  Service: Endoscopy;  Laterality: N/A;  . EYE SURGERY     IOL/ after cataracts removed- bilateral   . NASAL SINUS SURGERY  2009  . PERMANENT PACEMAKER INSERTION N/A 10/20/2011   Procedure: PERMANENT PACEMAKER INSERTION;  Surgeon: Thompson Grayer, MD;  Location[facility] CATH LAB;  Service: Cardiovascular;  Laterality: N/A;  . REPLACEMENT TOTAL KNEE  11/2006   right knee  . TOTAL HIP ARTHROPLASTY  09/2010   Family History  Problem Relation Age of Onset  . Heart disease Sister   . Heart disease Brother   . Heart disease Brother   . Heart disease Brother   . Diabetes Other   . Anesthesia problems Neg Hx   . Hypotension Neg Hx   . Malignant hyperthermia Neg Hx   . Pseudochol deficiency Neg Hx    Social History  Substance Use Topics  . Smoking status: Never Smoker  . Smokeless tobacco: Never Used  . Alcohol use No   Allergies  Allergen Reactions  . Citalopram Other (See Comments)    Reaction:  Altered mental status   . Cymbalta [Duloxetine Hcl] Other (See Comments)    Reaction:  Sedative for pt   . Imipramine Other (See Comments)    Reaction:  Unknown   . Proton Pump Inhibitors Other (See Comments)    Reaction:  Unknown   .  Venlafaxine Nausea And Vomiting and Other (See Comments)    Reaction:  Dizziness    Prior to Admission medications   Medication Sig Start Date End Date Taking? Authorizing Provider  acetaminophen (TYLENOL) 650 MG CR tablet Take 650 mg by mouth 3 (three) times daily.   Yes [provider]  acidophilus (RISAQUAD) CAPS capsule Take 1 capsule by mouth 2 (two) times daily. 06/26/16  Yes Bettey Costa, MD  aluminum-magnesium hydroxide-simethicone (MAALOX) 709-628-36 MG/5ML SUSP Take 30 mLs by mouth every 4 (four) hours as needed.   Yes [provider]  aspirin EC 81 MG tablet Take 81 mg by mouth daily.    Yes [provider]  atorvastatin (LIPITOR) 80 MG tablet Take 1 tablet (  80 mg total) by mouth daily. 06/26/17  Yes Epifanio Lesches, MD  carvedilol (COREG) 3.125 MG tablet Take 1 tablet (3.125 mg total) by mouth 2 (two) times daily. 06/25/17 06/25/18 Yes Epifanio Lesches, MD  famotidine (PEPCID) 20 MG tablet Take 1 tablet (20 mg total) by mouth 2 (two) times daily. 03/09/17  Yes Wieting, Richard, MD  furosemide (LASIX) 20 MG tablet Take 1 tablet (20 mg total) by mouth 2 (two) times daily. 06/25/17  Yes Epifanio Lesches, MD  gabapentin (NEURONTIN) 400 MG capsule Take 400 mg by mouth 3 (three) times daily.   Yes [provider]  lamoTRIgine (LAMICTAL) 100 MG tablet Take 100 mg by mouth at bedtime.   Yes [provider]  lamoTRIgine (LAMICTAL) 25 MG tablet Take 50 mg by mouth every morning.   Yes [provider]  LORazepam (ATIVAN) 0.5 MG tablet Take 1 tablet (0.5 mg total) by mouth at bedtime. 08/17/17  Yes Dustin Flock, MD  magnesium hydroxide (MILK OF MAGNESIA) 400 MG/5ML suspension Take 10 mLs by mouth daily.    Yes [provider]  magnesium oxide (MAG-OX) 400 MG tablet Take 1 tablet (400 mg total) by mouth daily. 03/09/17  Yes Wieting, Richard, MD  metFORMIN (GLUCOPHAGE) 500 MG tablet Take 500 mg by mouth 2 (two) times daily with a  meal.   Yes [provider]  metoCLOPramide (REGLAN) 5 MG tablet Take 1 tablet (5 mg total) by mouth 4 (four) times daily -  before meals and at bedtime. Patient taking differently: Take 5 mg by mouth daily.  03/09/17  Yes Wieting, Richard, MD  ondansetron (ZOFRAN) 4 MG tablet Take 4 mg by mouth every 6 (six) hours as needed for nausea or vomiting.    Yes [provider]  polyethylene glycol powder (GLYCOLAX/MIRALAX) powder Take 255 g by mouth daily. Mix entire contents of miralax (255g) with 64 oz of gatorade in a pitcher.   Drink 8oz of mixture every 30 minutes until you have multiple loose bowel movements. 03/16/17  Yes Harvest Dark, MD  Potassium Chloride ER 20 MEQ TBCR Take 20 mEq by mouth 2 (two) times daily. 03/09/17  Yes Wieting, Richard, MD  promethazine (PHENERGAN) 12.5 MG tablet Take 1 tablet (12.5 mg total) by mouth every 6 (six) hours as needed for nausea. Patient taking differently: Take 12.5 mg by mouth every 4 (four) hours as needed for nausea.  03/09/17  Yes Wieting, Richard, MD  QUEtiapine (SEROQUEL) 25 MG tablet Take 25 mg by mouth 2 (two) times daily.   Yes [provider]  sertraline (ZOLOFT) 50 MG tablet Take 3 tablets (150 mg total) by mouth at bedtime. 06/25/17  Yes Epifanio Lesches, MD  protein supplement shake (PREMIER PROTEIN) LIQD Take 325 mLs (11 oz total) by mouth 2 (two) times daily between meals. Patient not taking: Reported on 08/23/2017 06/25/17   Epifanio Lesches, MD     Review of Systems  Constitutional: Positive for fatigue. Negative for appetite change.  HENT: Negative for congestion, rhinorrhea and sore throat.   Eyes: Negative.   Respiratory: Positive for shortness of breath. Negative for chest tightness.   Cardiovascular: Negative for chest pain, palpitations and leg swelling.  Gastrointestinal: Negative for abdominal distention and abdominal pain.  Endocrine: Negative.   Genitourinary: Negative.   Musculoskeletal:  Negative for back pain and neck pain.  Skin: Negative.   Allergic/Immunologic: Negative.   Neurological: Positive for weakness and numbness ("burning in feet/legs"). Negative for dizziness and light-headedness.  Hematological: Negative for  adenopathy. Does not bruise/bleed easily.  Psychiatric/Behavioral: Positive for sleep disturbance (due to burning sensation in legs/feet). Negative for dysphoric mood. The patient is not nervous/anxious.    Vitals:   08/23/17 0952  BP: 100/77  Pulse: (!) 107  Resp: 18  SpO2: 100%  Weight: 204 lb (92.5 kg)  Height: 5\' 4"  (1.626 m)   Wt Readings from Last 3 Encounters:  08/23/17 204 lb (92.5 kg)  08/17/17 204 lb 6.4 oz (92.7 kg)  06/24/17 211 lb 6.4 oz (95.9 kg)   Lab Results  Component Value Date   CREATININE 0.58 08/17/2017   CREATININE 0.72 08/15/2017   CREATININE 0.86 06/22/2017    Physical Exam  Constitutional: She is oriented to person, place, and time. She appears well-developed and well-nourished.  HENT:  Head: Normocephalic and atraumatic.  Neck: Normal range of motion. Neck supple. No JVD present.  Cardiovascular: Regular rhythm.  Tachycardia present.   Pulmonary/Chest: Effort normal. She has no wheezes. She has no rales.  Abdominal: Soft. She exhibits no distension. There is no tenderness.  Musculoskeletal: She exhibits no edema or tenderness.  Neurological: She is alert and oriented to person, place, and time.  Skin: Skin is warm and dry.  Psychiatric: She has a normal mood and affect. Her behavior is normal. Thought content normal.  Nursing note and vitals reviewed.  Assessment and Plan:   1: Chronic heart failure with reduced ejection fraction- - NYHA class II - Chronic SOB and weakness  - Euvolemic  - elevate legs when she can - Not being weighed daily at SNF; Will put in order for daily weights and instructions on when to call if over night weight >2 pounds or weekly weight >5 pounds.  - Says drinking ~4 normal sized  glasses of water that are not completely full.  - Consider adding Entresto or increasing Coreg at next visit if blood pressure allows  - Cardiologist Caryl Comes)   2: HTN- - Blood pressure ok today (100/77) - Denies any dizziness or lightheadedness  - sees PCP Ouida Sills) at [facility]   3. DM-  - In clinic BG 98 - Says not checking BG at SNF  - Continue metformin  - Has burning in legs that has been prevalent for a couple of months; interferes with her sleep and says is worse at night   Patient brought in facility medication list. Every medication was verbally reviewed with patient.   Will follow up in 1 month or sooner if any questions/problems before then.   Candelaria Stagers, PharmD  Pharmacy Resident  08/23/17

## 2017-08-23 NOTE — Patient Instructions (Signed)
Begin weighing daily and call for an overnight weight gain of > 2 pounds or a weekly weight gain of >5 pounds. 

## 2017-09-20 ENCOUNTER — Ambulatory Visit: Admitting: Family

## 2017-09-21 ENCOUNTER — Ambulatory Visit: Attending: Family | Admitting: Family

## 2017-09-21 ENCOUNTER — Encounter: Payer: Self-pay | Admitting: Family

## 2017-09-21 VITALS — BP 86/65 | HR 102 | Resp 18 | Ht 64.0 in

## 2017-09-21 DIAGNOSIS — Z7984 Long term (current) use of oral hypoglycemic drugs: Secondary | ICD-10-CM | POA: Insufficient documentation

## 2017-09-21 DIAGNOSIS — I350 Nonrheumatic aortic (valve) stenosis: Secondary | ICD-10-CM | POA: Diagnosis not present

## 2017-09-21 DIAGNOSIS — Z952 Presence of prosthetic heart valve: Secondary | ICD-10-CM | POA: Diagnosis not present

## 2017-09-21 DIAGNOSIS — I2510 Atherosclerotic heart disease of native coronary artery without angina pectoris: Secondary | ICD-10-CM | POA: Insufficient documentation

## 2017-09-21 DIAGNOSIS — Z7982 Long term (current) use of aspirin: Secondary | ICD-10-CM | POA: Diagnosis not present

## 2017-09-21 DIAGNOSIS — Z951 Presence of aortocoronary bypass graft: Secondary | ICD-10-CM | POA: Insufficient documentation

## 2017-09-21 DIAGNOSIS — I5032 Chronic diastolic (congestive) heart failure: Secondary | ICD-10-CM | POA: Diagnosis present

## 2017-09-21 DIAGNOSIS — E785 Hyperlipidemia, unspecified: Secondary | ICD-10-CM | POA: Diagnosis not present

## 2017-09-21 DIAGNOSIS — K219 Gastro-esophageal reflux disease without esophagitis: Secondary | ICD-10-CM | POA: Diagnosis not present

## 2017-09-21 DIAGNOSIS — I110 Hypertensive heart disease with heart failure: Secondary | ICD-10-CM | POA: Insufficient documentation

## 2017-09-21 DIAGNOSIS — F329 Major depressive disorder, single episode, unspecified: Secondary | ICD-10-CM | POA: Diagnosis not present

## 2017-09-21 DIAGNOSIS — D649 Anemia, unspecified: Secondary | ICD-10-CM | POA: Diagnosis not present

## 2017-09-21 DIAGNOSIS — Z96659 Presence of unspecified artificial knee joint: Secondary | ICD-10-CM | POA: Diagnosis not present

## 2017-09-21 DIAGNOSIS — I480 Paroxysmal atrial fibrillation: Secondary | ICD-10-CM | POA: Insufficient documentation

## 2017-09-21 DIAGNOSIS — Z950 Presence of cardiac pacemaker: Secondary | ICD-10-CM | POA: Diagnosis not present

## 2017-09-21 DIAGNOSIS — J45909 Unspecified asthma, uncomplicated: Secondary | ICD-10-CM | POA: Insufficient documentation

## 2017-09-21 DIAGNOSIS — E1140 Type 2 diabetes mellitus with diabetic neuropathy, unspecified: Secondary | ICD-10-CM | POA: Insufficient documentation

## 2017-09-21 DIAGNOSIS — M797 Fibromyalgia: Secondary | ICD-10-CM | POA: Diagnosis not present

## 2017-09-21 DIAGNOSIS — I5022 Chronic systolic (congestive) heart failure: Secondary | ICD-10-CM

## 2017-09-21 DIAGNOSIS — I10 Essential (primary) hypertension: Secondary | ICD-10-CM

## 2017-09-21 NOTE — Patient Instructions (Signed)
Continue weighing daily and call for an overnight weight gain of > 2 pounds or a weekly weight gain of >5 pounds. 

## 2017-09-21 NOTE — Progress Notes (Signed)
Patient ID: NICKY MILHOUSE, female    DOB: 20-Mar-1931, 83 y.o.   MRN: [id number]  HPI  Ms. Axley is an 83 year old women with a past medical history of HLD, HTN, depression, GERD, CAD s/p CABG, T2DM, diabetic neuropathy, paroxysmal Afib s/p pacemaker, GERD, aortic stenosis, anemia, fibromyalgia and chronic heart failure.   Most recent Echo on 06/13/17 showed LV EF 20-25% along with trivial AR, severe MR and moderate TR. This has declined from previous EF of 55-60% on 07/23/15. Cardiac catheterization was done October 2012.  Most recent hospital admission on 08/15/17 due to shortness of breath. Was started on IV lasix and continued on her home medications during admission. Cardiology consult was obtained. Returned to baseline and discharged (08/17/17) back to skilled nursing facility where hospice continues to follow her. Admitted 06/12/17 due to HF exacerbation along with aortic stenosis s/p AVR. Cardiology consult obtained. Initially needed IV diuretics and then transitioned to oral diuretics. Discharged to SNF after 13 days.  Patient presents for a follow-up visit with a chief complaint of moderate fatigue with little exertion. She describes this as chronic in nature having been present for several years with varying levels of severity. She has associated shortness of breath, weakness and burning sensation in feet. She denies any chest pain, edema, palpitations or dizziness,  Past Medical History:  Diagnosis Date  . Anemia   . Aortic stenosis, severe    a. s/p Magna Ease pericardial tissue valve size 21 mm replacement in 10/2011 for severe AS 11/12; b. echo 07/2015: EF 55-60% mod concentric LVH, GR1DD, LA mildly dilated, PASP 45 mm Hg  . Atrial tachycardia, paroxysmal (HCC)    a. with rate related LBBB.  . Cardiac pacemaker -st Judes    11/12  . Chronic diastolic heart failure ([place])    a. echo 2014: EF 55-60%, no RWMA, GR1DD, PASP 47 mm Hg; b. echo 07/2015: EF 55-60% mod concentric LVH, GR1DD,  LA mildly dilated, PASP 45 mm Hg  . Complete heart block [facility])    a. s/p St Jude PPM 10/2011 (Ser # [id number]).  . Coronary artery disease    a. s/p 2v CABG 11/12 (VG-LAD, VG-OM1); b. Lexiscan 08/2015: low risk, no ischemia, EF 55-65%.  . Enthesopathy of hip region   . Esophageal reflux    followed by Dr.Seigal. stabilized with a combination of Nexium and Zantac  . Fibromyalgia   . HLD (hyperlipidemia)   . Hypertensive heart disease   . Knee joint replacement by other means   . Morbid obesity ([place])   . Neuralgia, neuritis, and radiculitis, unspecified   . Neuropathy   . Onychia and paronychia of toe   . Osteoarthrosis, unspecified whether generalized or localized, pelvic region and thigh    mainly in her back and knees  . PAF (paroxysmal atrial fibrillation) ([place])    a. brief episodes of AF previously noted on device interrogations.  Marland Kitchen RAD (reactive airway disease)    a. chronic SOB  . Spinal stenosis   . Stress incontinence, female    followed by Dr.Cope  . Type II or unspecified type diabetes mellitus without mention of complication, not stated as uncontrolled    a. pt. reports that she is borderline   . Wide-complex tachycardia ([place])    a. Noted 4/10 - brief episode in ED. Noted again 4/21 in clinic->felt most likely to be atrial tach.   Past Surgical History:  Procedure Laterality Date  . AORTIC VALVE REPLACEMENT  10/17/2011  Procedure: AORTIC VALVE REPLACEMENT (AVR);  Surgeon: Melrose Nakayama, MD;  Location: [place];  Service: Open Heart Surgery;  Laterality: N/A;  . CARDIAC CATHETERIZATION  08/2009   50% stenosis distal left main, 50% stenosis ostial left circumflex.   Marland Kitchen CARDIAC CATHETERIZATION     at [facility]  . CORONARY ARTERY BYPASS GRAFT  10/17/2011   Procedure: CORONARY ARTERY BYPASS GRAFTING (CABG);  Surgeon: Melrose Nakayama, MD;  Location: [place];  Service: Open Heart Surgery;  Laterality: N/A;  Times Two, using right leg greater saphenous vein harvested  endoscopically  . ESOPHAGOGASTRODUODENOSCOPY (EGD) WITH PROPOFOL N/A 10/12/2016   Procedure: ESOPHAGOGASTRODUODENOSCOPY (EGD) WITH PROPOFOL;  Surgeon: Lucilla Lame, MD;  Location: [facility] ENDOSCOPY;  Service: Endoscopy;  Laterality: N/A;  . EYE SURGERY     IOL/ after cataracts removed- bilateral   . NASAL SINUS SURGERY  2009  . PERMANENT PACEMAKER INSERTION N/A 10/20/2011   Procedure: PERMANENT PACEMAKER INSERTION;  Surgeon: Thompson Grayer, MD;  Location: [facility] CATH LAB;  Service: Cardiovascular;  Laterality: N/A;  . REPLACEMENT TOTAL KNEE  11/2006   right knee  . TOTAL HIP ARTHROPLASTY  09/2010   Family History  Problem Relation Age of Onset  . Heart disease Sister   . Heart disease Brother   . Heart disease Brother   . Heart disease Brother   . Diabetes Other   . Anesthesia problems Neg Hx   . Hypotension Neg Hx   . Malignant hyperthermia Neg Hx   . Pseudochol deficiency Neg Hx    Social History  Substance Use Topics  . Smoking status: Never Smoker  . Smokeless tobacco: Never Used  . Alcohol use No   Allergies  Allergen Reactions  . Citalopram Other (See Comments)    Reaction:  Altered mental status   . Cymbalta [Duloxetine Hcl] Other (See Comments)    Reaction:  Sedative for pt   . Imipramine Other (See Comments)    Reaction:  Unknown   . Proton Pump Inhibitors Other (See Comments)    Reaction:  Unknown   . Venlafaxine Nausea And Vomiting and Other (See Comments)    Reaction:  Dizziness    Prior to Admission medications   Medication Sig Start Date End Date Taking? Authorizing Provider  acetaminophen (TYLENOL) 650 MG CR tablet Take 650 mg by mouth 3 (three) times daily.   Yes [provider]  acidophilus (RISAQUAD) CAPS capsule Take 1 capsule by mouth 2 (two) times daily. 06/26/16  Yes Bettey Costa, MD  aluminum-magnesium hydroxide-simethicone (MAALOX) 301-601-09 MG/5ML SUSP Take 30 mLs by mouth every 4 (four) hours as needed.   Yes [provider]  aspirin EC  81 MG tablet Take 81 mg by mouth daily.    Yes [provider]  carvedilol (COREG) 3.125 MG tablet Take 1 tablet (3.125 mg total) by mouth 2 (two) times daily. 06/25/17 06/25/18 Yes Epifanio Lesches, MD  famotidine (PEPCID) 20 MG tablet Take 1 tablet (20 mg total) by mouth 2 (two) times daily. 03/09/17  Yes Wieting, Richard, MD  furosemide (LASIX) 20 MG tablet Take 1 tablet (20 mg total) by mouth 2 (two) times daily. 06/25/17  Yes Epifanio Lesches, MD  gabapentin (NEURONTIN) 400 MG capsule Take 400 mg by mouth 3 (three) times daily.   Yes [provider]  ipratropium-albuterol (DUONEB) 0.5-2.5 (3) MG/3ML SOLN Take 3 mLs by nebulization.   Yes [provider]  lamoTRIgine (LAMICTAL) 100 MG tablet Take 100 mg by mouth at bedtime.   Yes  [provider]  lamoTRIgine (LAMICTAL) 25 MG tablet Take 50 mg by mouth every morning.   Yes [provider]  LORazepam (ATIVAN) 0.5 MG tablet Take 1 tablet (0.5 mg total) by mouth at bedtime. 08/17/17  Yes Dustin Flock, MD  magnesium hydroxide (MILK OF MAGNESIA) 400 MG/5ML suspension Take 10 mLs by mouth daily.    Yes [provider]  metoCLOPramide (REGLAN) 5 MG tablet Take 1 tablet (5 mg total) by mouth 4 (four) times daily -  before meals and at bedtime. 03/09/17  Yes Wieting, Richard, MD  morphine 20 MG/5ML solution Take 5 mg by mouth every 2 (two) hours as needed for pain.   Yes [provider]  ondansetron (ZOFRAN) 4 MG tablet Take 4 mg by mouth every 6 (six) hours as needed for nausea or vomiting.    Yes [provider]  polyethylene glycol powder (GLYCOLAX/MIRALAX) powder Take 255 g by mouth daily. Mix entire contents of miralax (255g) with 64 oz of gatorade in a pitcher.   Drink 8oz of mixture every 30 minutes until you have multiple loose bowel movements. 03/16/17  Yes Harvest Dark, MD  Potassium Chloride ER 20 MEQ TBCR Take 20 mEq by mouth 2 (two) times daily. 03/09/17  Yes Wieting,  Richard, MD  promethazine (PHENERGAN) 12.5 MG tablet Take 1 tablet (12.5 mg total) by mouth every 6 (six) hours as needed for nausea. Patient taking differently: Take 12.5 mg by mouth every 4 (four) hours as needed for nausea.  03/09/17  Yes Wieting, Richard, MD  QUEtiapine (SEROQUEL) 25 MG tablet Take 25 mg by mouth 2 (two) times daily.   Yes [provider]  sertraline (ZOLOFT) 50 MG tablet Take 3 tablets (150 mg total) by mouth at bedtime. 06/25/17  Yes Epifanio Lesches, MD  atorvastatin (LIPITOR) 80 MG tablet Take 1 tablet (80 mg total) by mouth daily. 06/26/17   Epifanio Lesches, MD  magnesium oxide (MAG-OX) 400 MG tablet Take 1 tablet (400 mg total) by mouth daily. 03/09/17   Loletha Grayer, MD  metFORMIN (GLUCOPHAGE) 500 MG tablet Take 500 mg by mouth 2 (two) times daily with a meal.    [provider]   Review of Systems  Constitutional: Positive for fatigue. Negative for appetite change.  HENT: Negative for congestion, rhinorrhea and sore throat.   Eyes: Negative.   Respiratory: Positive for shortness of breath. Negative for chest tightness.   Cardiovascular: Negative for chest pain, palpitations and leg swelling.  Gastrointestinal: Negative for abdominal distention and abdominal pain.  Endocrine: Negative.   Genitourinary: Negative.   Musculoskeletal: Negative for back pain and neck pain.  Skin: Negative.   Allergic/Immunologic: Negative.   Neurological: Positive for weakness and numbness ("burning in feet/legs"). Negative for dizziness and light-headedness.  Hematological: Negative for adenopathy. Does not bruise/bleed easily.  Psychiatric/Behavioral: Positive for sleep disturbance (due to burning sensation in legs/feet). Negative for dysphoric mood. The patient is not nervous/anxious.    Vitals:   09/21/17 1025  BP: (!) 86/65  Pulse: (!) 102  Resp: 18  SpO2: 100%  Height: 5\' 4"  (1.626 m)   Wt Readings from Last 3 Encounters:  08/23/17 204 lb (92.5  kg)  08/17/17 204 lb 6.4 oz (92.7 kg)  06/24/17 211 lb 6.4 oz (95.9 kg)    Lab Results  Component Value Date   CREATININE 0.58 08/17/2017   CREATININE 0.72 08/15/2017   CREATININE 0.86 06/22/2017    Physical Exam  Constitutional: She is oriented to person, place,  and time. She appears well-developed and well-nourished.  HENT:  Head: Normocephalic and atraumatic.  Neck: Normal range of motion. Neck supple. No JVD present.  Cardiovascular: Regular rhythm.  Tachycardia present.   Pulmonary/Chest: Effort normal. She has no wheezes. She has no rales.  Abdominal: Soft. She exhibits no distension. There is no tenderness.  Musculoskeletal: She exhibits no edema or tenderness.  Neurological: She is alert and oriented to person, place, and time.  Skin: Skin is warm and dry.  Psychiatric: She has a normal mood and affect. Her behavior is normal. Thought content normal.  Nursing note and vitals reviewed.  Assessment and Plan:   1: Chronic heart failure with reduced ejection fraction- - NYHA class III - Chronic SOB and weakness  - Euvolemic  - elevate legs when she can - still not being weighed at the facility. Order placed again to weigh daily and call for an overnight weight gain of >2 pounds or a weekly weight gain of >5 pounds - Says drinking ~4 normal sized glasses of water that are not completely full.  - unable to add/ titrate up meds due to hypotension - Cardiologist Caryl Comes)  - now being followed by hospice   2: HTN- - Blood pressure low today - Denies any dizziness or lightheadedness  - sees PCP Ouida Sills) at [facility]  - needs to continue carvedilol if possible due to tachycardia  3. DM-  - Says not checking BG at SNF  - Continue metformin  - Has burning in legs that has been prevalent for a couple of months; interferes with her sleep and says is worse at night  - to follow with facility PCP and/or hospice nurse regarding this  Patient brought in facility  medication list. Every medication was verbally reviewed with patient.   Since hospice is now involved, will have patient follow-up as needed.

## 2017-10-04 DEATH — deceased

## 2017-10-12 IMAGING — CT CT CERVICAL SPINE W/O CM
3 of 7 series · 12 of 33 positions shown, 13 images · non-contrast
Comparison: CT of the head performed 09/15/2015, and MRI of the
brain performed 09/08/2008

CLINICAL DATA: Acute onset of dizziness and fall. Right-sided head
pain. Concern for cervical spine injury. Initial encounter.

EXAM:
CT HEAD WITHOUT CONTRAST
CT CERVICAL SPINE WITHOUT CONTRAST
TECHNIQUE: Multidetector CT imaging of the head and cervical spine was
performed following the standard protocol without intravenous
contrast. Multiplanar CT image reconstructions of the cervical spine
were also generated.

[Series 8: coronal soft tissue · coronal · 0.31mm/px · 3 of 66 slices shown]
[im 17/66  bone]
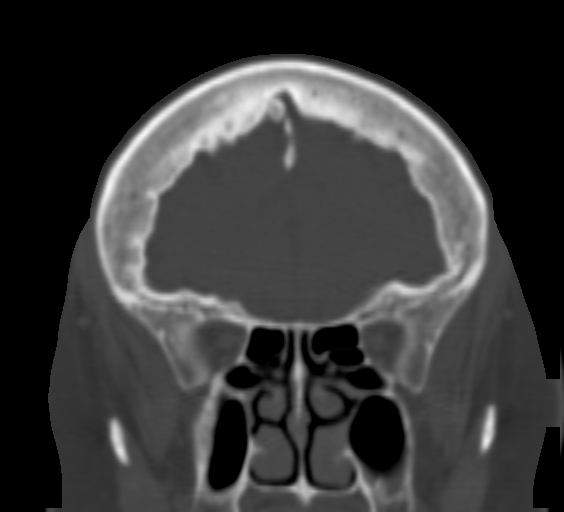
[im 33/66  bone]
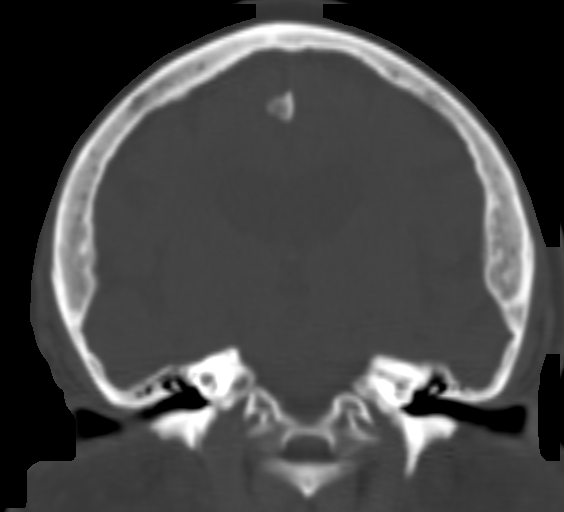
[im 49/66  bone]
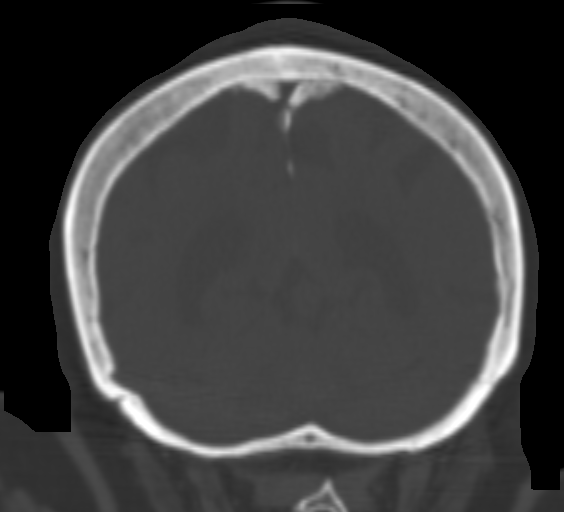

[Series 10: sagittal bone · sagittal · 0.25mm/px · 5 of 60 slices shown]
[im 10/60  bone]
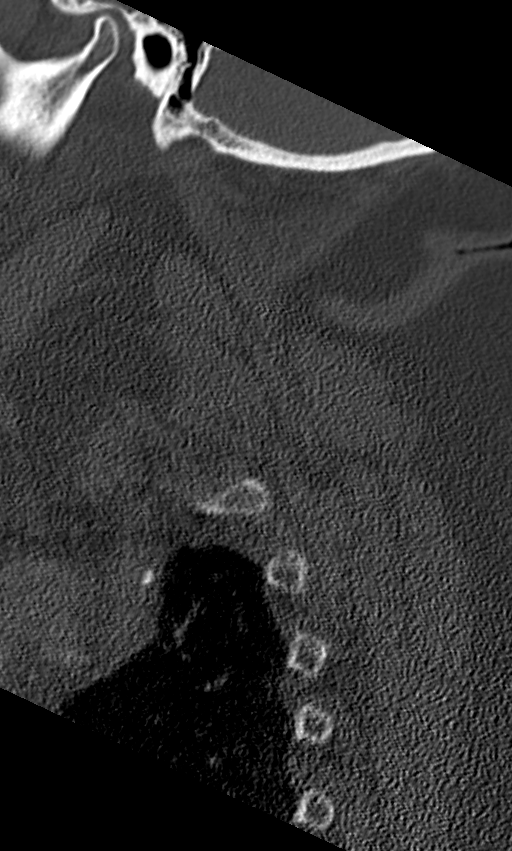
[im 20/60  bone]
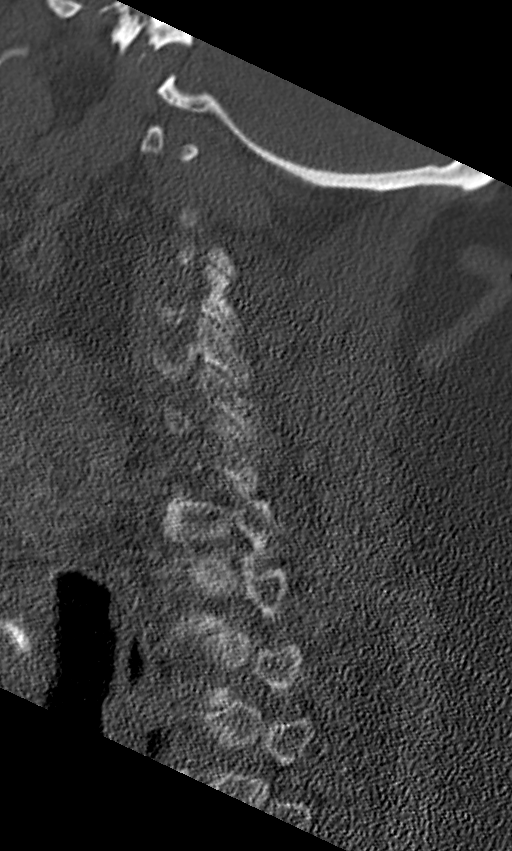
[im 30/60  bone]
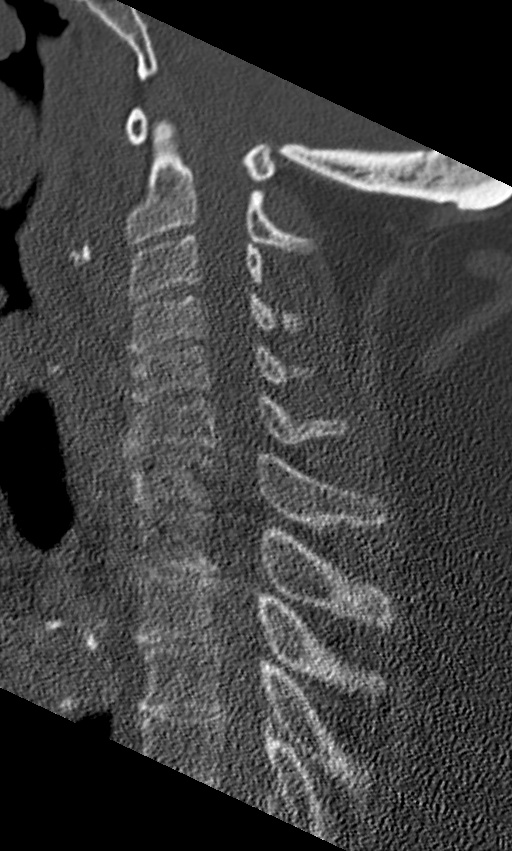
[im 40/60  bone]
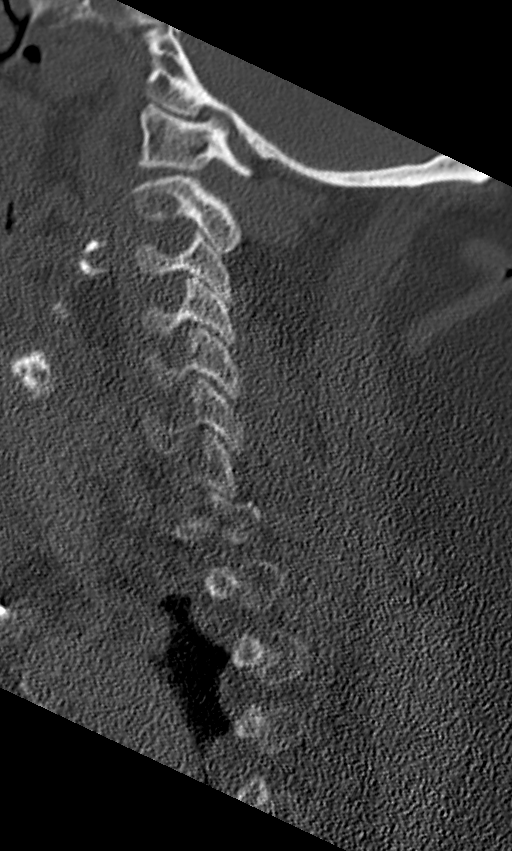
[im 50/60  bone]
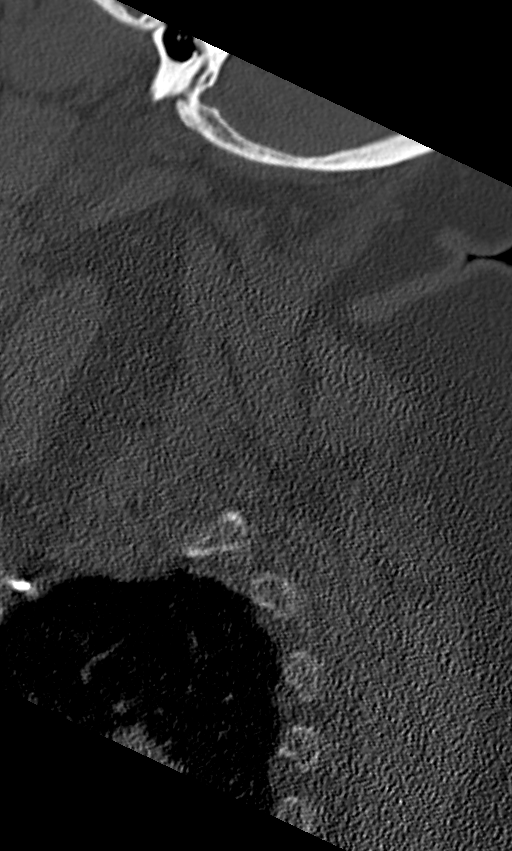

[Series 12: axial · axial · 0.22mm/px · z∈[-280,-178]mm · 4 of 97 slices shown, 5 images]
[im 20/97  soft-tissue]
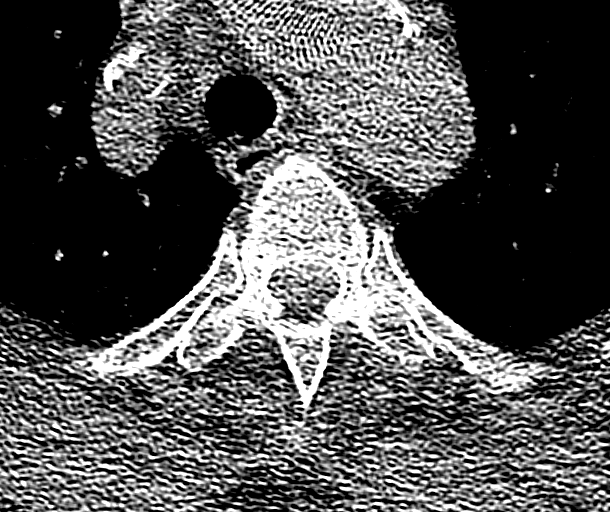
[im 20/97  bone]
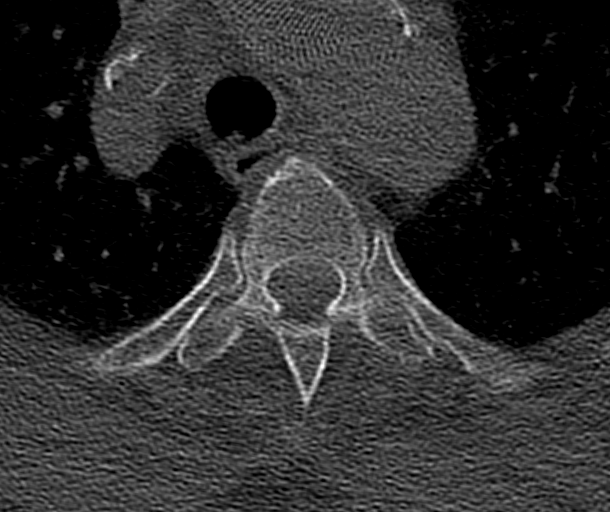
[im 39/97  bone]
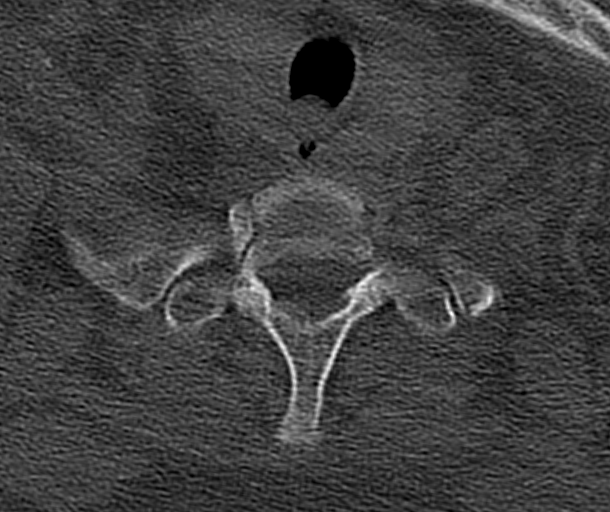
[im 58/97  bone]
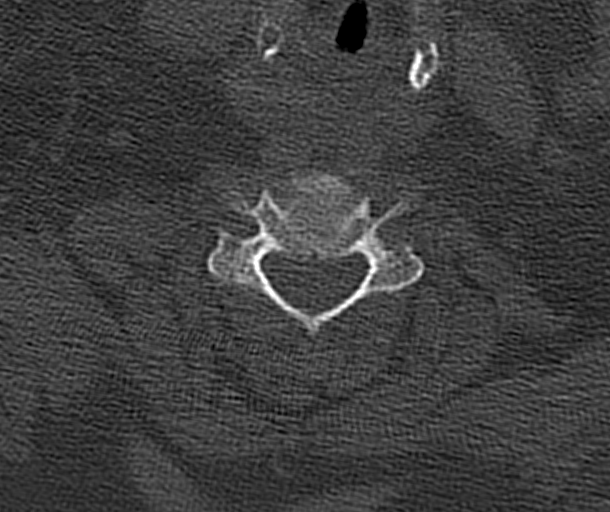
[im 77/97  bone]
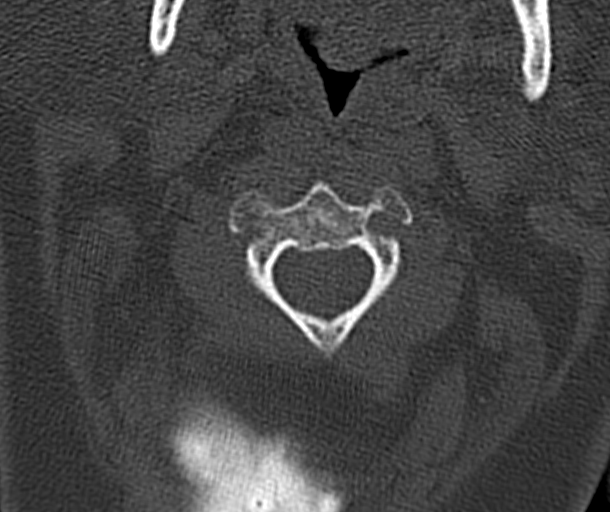

[12 of 33 positions shown; findings below may reference images not displayed]

FINDINGS: CT HEAD FINDINGS

There is no evidence of acute infarction, mass lesion, or intra- or
extra-axial hemorrhage on CT.

Prominence of the ventricles and sulci reflects moderate cortical
volume loss. Mild periventricular and subcortical white matter
change likely reflects small vessel ischemic microangiopathy.

The brainstem and fourth ventricle are within normal limits. The
basal ganglia are unremarkable in appearance. The cerebral
hemispheres demonstrate grossly normal gray-white differentiation.
No mass effect or midline shift is seen.

There is no evidence of fracture; visualized osseous structures are
unremarkable in appearance. The orbits are within normal limits. The
paranasal sinuses and mastoid air cells are well-aerated. No
significant soft tissue abnormalities are seen.

CT CERVICAL SPINE FINDINGS

There is no evidence of fracture or subluxation. Vertebral bodies
demonstrate normal height and alignment. Mild multilevel disc space
narrowing is noted along the cervical spine, with small anterior and
posterior disc osteophyte complexes. Prevertebral soft tissues are
not well assessed due to retropharyngeal common carotid arteries.

The thyroid gland is unremarkable in appearance. The visualized lung
apices are clear. Dense calcification is noted at the left carotid
bifurcation, likely resulting in relatively severe luminal
narrowing.
IMPRESSION: 1. No evidence of traumatic intracranial injury or fracture.
2. No evidence of fracture or subluxation along the cervical spine.
3. Moderate cortical volume loss and scattered small vessel ischemic
microangiopathy.
4. Mild degenerative change along the cervical spine.
5. Dense calcification at the left carotid bifurcation, likely
resulting in relatively severe luminal narrowing. Carotid ultrasound
would be helpful for further evaluation, when and as deemed
clinically appropriate.
